# Patient Record
Sex: Female | Born: 1940 | Race: White | Hispanic: No | Smoking: Former smoker
Health system: Southern US, Community
[De-identification: ages and names within clinical notes are randomized; demographics above are authoritative.]

## PROBLEM LIST (undated history)

## (undated) DIAGNOSIS — G4730 Sleep apnea, unspecified: Secondary | ICD-10-CM

## (undated) DIAGNOSIS — A0472 Enterocolitis due to Clostridium difficile, not specified as recurrent: Secondary | ICD-10-CM

## (undated) DIAGNOSIS — E7800 Pure hypercholesterolemia, unspecified: Secondary | ICD-10-CM

## (undated) DIAGNOSIS — K5792 Diverticulitis of intestine, part unspecified, without perforation or abscess without bleeding: Secondary | ICD-10-CM

## (undated) DIAGNOSIS — C649 Malignant neoplasm of unspecified kidney, except renal pelvis: Secondary | ICD-10-CM

## (undated) DIAGNOSIS — I252 Old myocardial infarction: Secondary | ICD-10-CM

## (undated) DIAGNOSIS — D509 Iron deficiency anemia, unspecified: Secondary | ICD-10-CM

## (undated) DIAGNOSIS — Z9981 Dependence on supplemental oxygen: Secondary | ICD-10-CM

## (undated) DIAGNOSIS — E119 Type 2 diabetes mellitus without complications: Secondary | ICD-10-CM

## (undated) DIAGNOSIS — I272 Pulmonary hypertension, unspecified: Secondary | ICD-10-CM

## (undated) DIAGNOSIS — I509 Heart failure, unspecified: Secondary | ICD-10-CM

## (undated) DIAGNOSIS — N186 End stage renal disease: Secondary | ICD-10-CM

## (undated) DIAGNOSIS — I6529 Occlusion and stenosis of unspecified carotid artery: Secondary | ICD-10-CM

## (undated) DIAGNOSIS — R569 Unspecified convulsions: Secondary | ICD-10-CM

## (undated) DIAGNOSIS — J449 Chronic obstructive pulmonary disease, unspecified: Secondary | ICD-10-CM

## (undated) DIAGNOSIS — I2510 Atherosclerotic heart disease of native coronary artery without angina pectoris: Secondary | ICD-10-CM

## (undated) DIAGNOSIS — Z992 Dependence on renal dialysis: Secondary | ICD-10-CM

## (undated) DIAGNOSIS — I10 Essential (primary) hypertension: Secondary | ICD-10-CM

## (undated) DIAGNOSIS — I4891 Unspecified atrial fibrillation: Secondary | ICD-10-CM

## (undated) DIAGNOSIS — N2581 Secondary hyperparathyroidism of renal origin: Secondary | ICD-10-CM

## (undated) DIAGNOSIS — G40909 Epilepsy, unspecified, not intractable, without status epilepticus: Secondary | ICD-10-CM

## (undated) DIAGNOSIS — I219 Acute myocardial infarction, unspecified: Secondary | ICD-10-CM

## (undated) DIAGNOSIS — Z9289 Personal history of other medical treatment: Secondary | ICD-10-CM

## (undated) DIAGNOSIS — N289 Disorder of kidney and ureter, unspecified: Secondary | ICD-10-CM

## (undated) DIAGNOSIS — K219 Gastro-esophageal reflux disease without esophagitis: Secondary | ICD-10-CM

## (undated) DIAGNOSIS — Z8701 Personal history of pneumonia (recurrent): Secondary | ICD-10-CM

## (undated) HISTORY — DX: Heart failure, unspecified: I50.9

## (undated) HISTORY — DX: Acute myocardial infarction, unspecified: I21.9

## (undated) HISTORY — DX: Essential (primary) hypertension: I10

## (undated) HISTORY — DX: Malignant neoplasm of unspecified kidney, except renal pelvis: C64.9

## (undated) HISTORY — PX: ABDOMINAL HYSTERECTOMY: SHX81

## (undated) HISTORY — DX: Occlusion and stenosis of unspecified carotid artery: I65.29

## (undated) HISTORY — DX: Epilepsy, unspecified, not intractable, without status epilepticus: G40.909

## (undated) HISTORY — DX: Enterocolitis due to Clostridium difficile, not specified as recurrent: A04.72

## (undated) HISTORY — DX: Atherosclerotic heart disease of native coronary artery without angina pectoris: I25.10

## (undated) HISTORY — DX: Pulmonary hypertension, unspecified: I27.20

## (undated) HISTORY — PX: APPENDECTOMY: SHX54

## (undated) HISTORY — DX: Unspecified atrial fibrillation: I48.91

---

## 1989-05-30 HISTORY — PX: CORONARY ANGIOPLASTY WITH STENT PLACEMENT: SHX49

## 1989-05-30 HISTORY — PX: CORONARY ANGIOPLASTY: SHX604

## 2003-09-30 DIAGNOSIS — C649 Malignant neoplasm of unspecified kidney, except renal pelvis: Secondary | ICD-10-CM

## 2003-09-30 HISTORY — PX: NEPHRECTOMY: SHX65

## 2003-09-30 HISTORY — DX: Malignant neoplasm of unspecified kidney, except renal pelvis: C64.9

## 2009-10-01 ENCOUNTER — Ambulatory Visit: Payer: Self-pay | Admitting: Vascular Surgery

## 2009-10-05 ENCOUNTER — Inpatient Hospital Stay (HOSPITAL_COMMUNITY): Admission: RE | Admit: 2009-10-05 | Discharge: 2009-10-06 | Payer: Self-pay | Admitting: Vascular Surgery

## 2009-10-05 ENCOUNTER — Encounter: Payer: Self-pay | Admitting: Vascular Surgery

## 2009-10-05 ENCOUNTER — Ambulatory Visit: Payer: Self-pay | Admitting: Vascular Surgery

## 2009-10-05 HISTORY — PX: AV FISTULA PLACEMENT: SHX1204

## 2009-10-16 ENCOUNTER — Ambulatory Visit: Payer: Self-pay | Admitting: Vascular Surgery

## 2009-11-27 DIAGNOSIS — G40909 Epilepsy, unspecified, not intractable, without status epilepticus: Secondary | ICD-10-CM

## 2009-11-27 HISTORY — DX: Epilepsy, unspecified, not intractable, without status epilepticus: G40.909

## 2009-12-11 ENCOUNTER — Ambulatory Visit: Payer: Self-pay | Admitting: Vascular Surgery

## 2009-12-20 ENCOUNTER — Inpatient Hospital Stay (HOSPITAL_COMMUNITY): Admission: RE | Admit: 2009-12-20 | Discharge: 2009-12-21 | Payer: Self-pay | Admitting: Vascular Surgery

## 2009-12-20 ENCOUNTER — Encounter: Payer: Self-pay | Admitting: Vascular Surgery

## 2009-12-20 ENCOUNTER — Ambulatory Visit: Payer: Self-pay | Admitting: Vascular Surgery

## 2010-01-22 ENCOUNTER — Ambulatory Visit: Payer: Self-pay | Admitting: Vascular Surgery

## 2010-09-03 ENCOUNTER — Ambulatory Visit: Payer: Self-pay | Admitting: Vascular Surgery

## 2010-09-29 DIAGNOSIS — R569 Unspecified convulsions: Secondary | ICD-10-CM

## 2010-09-29 HISTORY — PX: CAROTID ENDARTERECTOMY: SUR193

## 2010-09-29 HISTORY — DX: Unspecified convulsions: R56.9

## 2010-10-20 ENCOUNTER — Encounter: Payer: Self-pay | Admitting: Vascular Surgery

## 2010-12-15 LAB — COMPREHENSIVE METABOLIC PANEL
Alkaline Phosphatase: 80 U/L (ref 39–117)
BUN: 37 mg/dL — ABNORMAL HIGH (ref 6–23)
CO2: 32 mEq/L (ref 19–32)
Chloride: 98 mEq/L (ref 96–112)
Creatinine, Ser: 2.15 mg/dL — ABNORMAL HIGH (ref 0.4–1.2)
GFR calc non Af Amer: 23 mL/min — ABNORMAL LOW (ref >60)
Potassium: 4.6 mEq/L (ref 3.5–5.1)
Total Protein: 6.6 g/dL (ref 6.0–8.3)

## 2010-12-15 LAB — CBC
HCT: 34.9 % — ABNORMAL LOW (ref 36.0–46.0)
MCHC: 33.9 g/dL (ref 30.0–36.0)
MCV: 93.2 fL (ref 78.0–100.0)
RBC: 3.75 MIL/uL — ABNORMAL LOW (ref 3.87–5.11)
RDW: 15.9 % — ABNORMAL HIGH (ref 11.5–15.5)

## 2010-12-15 LAB — BASIC METABOLIC PANEL
BUN: 33 mg/dL — ABNORMAL HIGH (ref 6–23)
CO2: 30 mEq/L (ref 19–32)
Calcium: 8.2 mg/dL — ABNORMAL LOW (ref 8.4–10.5)
Chloride: 102 mEq/L (ref 96–112)
GFR calc Af Amer: 29 mL/min — ABNORMAL LOW (ref >60)
Sodium: 140 mEq/L (ref 135–145)

## 2010-12-15 LAB — URINALYSIS, ROUTINE W REFLEX MICROSCOPIC
Bilirubin Urine: NEGATIVE
Ketones, ur: NEGATIVE mg/dL
Protein, ur: 100 mg/dL — AB
Urobilinogen, UA: 0.2 mg/dL (ref 0.0–1.0)

## 2010-12-15 LAB — APTT: aPTT: 33 seconds (ref 24–37)

## 2010-12-15 LAB — GLUCOSE, CAPILLARY
Glucose-Capillary: 113 mg/dL — ABNORMAL HIGH (ref 70–99)
Glucose-Capillary: 182 mg/dL — ABNORMAL HIGH (ref 70–99)
Glucose-Capillary: 88 mg/dL (ref 70–99)

## 2010-12-15 LAB — CROSSMATCH: Antibody Screen: NEGATIVE

## 2010-12-15 LAB — URINE MICROSCOPIC-ADD ON

## 2010-12-23 LAB — GLUCOSE, CAPILLARY
Glucose-Capillary: 117 mg/dL — ABNORMAL HIGH (ref 70–99)
Glucose-Capillary: 130 mg/dL — ABNORMAL HIGH (ref 70–99)
Glucose-Capillary: 146 mg/dL — ABNORMAL HIGH (ref 70–99)

## 2010-12-23 LAB — CBC
HCT: 18.5 % — ABNORMAL LOW (ref 36.0–46.0)
HCT: 29.2 % — ABNORMAL LOW (ref 36.0–46.0)
Hemoglobin: 6.1 g/dL — CL (ref 12.0–15.0)
Hemoglobin: 9.9 g/dL — ABNORMAL LOW (ref 12.0–15.0)
RBC: 1.92 MIL/uL — ABNORMAL LOW (ref 3.87–5.11)
RBC: 3.05 MIL/uL — ABNORMAL LOW (ref 3.87–5.11)
WBC: 10.7 10*3/uL — ABNORMAL HIGH (ref 4.0–10.5)

## 2010-12-23 LAB — CROSSMATCH

## 2010-12-23 LAB — POCT I-STAT 4, (NA,K, GLUC, HGB,HCT)
Hemoglobin: 9.2 g/dL — ABNORMAL LOW (ref 12.0–15.0)
Potassium: 4 mEq/L (ref 3.5–5.1)
Sodium: 142 mEq/L (ref 135–145)

## 2010-12-23 LAB — COMPREHENSIVE METABOLIC PANEL
ALT: 22 U/L (ref 0–35)
AST: 19 U/L (ref 0–37)
Albumin: 3.2 g/dL — ABNORMAL LOW (ref 3.5–5.2)
Alkaline Phosphatase: 94 U/L (ref 39–117)
Calcium: 9.5 mg/dL (ref 8.4–10.5)
GFR calc Af Amer: 26 mL/min — ABNORMAL LOW (ref >60)
Glucose, Bld: 107 mg/dL — ABNORMAL HIGH (ref 70–99)
Potassium: 4.2 mEq/L (ref 3.5–5.1)
Sodium: 143 mEq/L (ref 135–145)
Total Bilirubin: 0.4 mg/dL (ref 0.3–1.2)
Total Protein: 6.1 g/dL (ref 6.0–8.3)

## 2010-12-23 LAB — URINALYSIS, ROUTINE W REFLEX MICROSCOPIC
Bilirubin Urine: NEGATIVE
Ketones, ur: NEGATIVE mg/dL

## 2010-12-23 LAB — BASIC METABOLIC PANEL
Calcium: 8.2 mg/dL — ABNORMAL LOW (ref 8.4–10.5)
GFR calc Af Amer: 25 mL/min — ABNORMAL LOW (ref >60)
GFR calc non Af Amer: 21 mL/min — ABNORMAL LOW (ref >60)
Potassium: 4.3 mEq/L (ref 3.5–5.1)
Sodium: 140 mEq/L (ref 135–145)

## 2010-12-23 LAB — PROTIME-INR: Prothrombin Time: 13 seconds (ref 11.6–15.2)

## 2010-12-23 LAB — URINE MICROSCOPIC-ADD ON

## 2011-02-11 NOTE — Assessment & Plan Note (Signed)
OFFICE VISIT   Cook, Suzanne A  DOB:  [birth year]                                       09/03/2010  [id number]#:[id number]   Patient returns for continued follow-up today her staged bilateral  carotid endarterectomies done earlier this year.  She had the right side  done in January, the left side done in March.  She did have a severe  reperfusion syndrome and was admitted to [facility] following her  discharge from the left side.  Had seizure disorders temporarily, which  resolved, and she has been totally neurologically intact since that  time.  She denies any hemiparesis, aphasia, amaurosis fugax, diplopia,  blurred vision, syncope, or other neurologic symptoms since her  discharge.  She is taking 1 aspirin per day.   CHRONIC MEDICAL PROBLEMS:  1. Diabetes.  2. Hypertension.  3. Coronary artery disease.  4. History of congestive heart failure.  5. COPD, occasionally on home oxygen.   SOCIAL HISTORY:  She is widowed and has 5 children.  Does not use  tobacco or alcohol.  She quit smoking in 2005.   REVIEW OF SYSTEMS:  Positive for occasional claudication-type symptoms  in her legs, occasional home oxygen, chronic bronchitis.  No chest pain  or orthopnea.   PHYSICAL EXAMINATION:  Blood pressure 143/78, heart rate 76,  respirations 16.  General:  She is a well-developed, well-nourished  female in no apparent distress.  HEENT:  Normal for age.  EOMs intact.  Lungs:  Clear to auscultation.  Bilateral rhonchi noted.  Cardiovascular:  Regular rhythm with no murmurs.  Carotid pulses 3+.  No  bruits.  Abdomen:  Soft, nontender with no masses.  Musculoskeletal:  Free of major deformities.  Neurologic:  Normal.  Skin is free of  rashes.   Today I ordered a carotid duplex exam, which I have reviewed and  interpreted.  Both internal carotids are widely patent with no evidence  of restenosis.   I am very pleased with her result and will continue to follow her  on a  regular basis.  She will return in 6 months with a follow-up carotid  duplex at that time unless she develops any symptoms in the interim.     [name], M.D.  Electronically Signed   JDL/MEDQ  D:  09/03/2010  T:  09/04/2010  Job:  [id number]

## 2011-02-11 NOTE — Assessment & Plan Note (Signed)
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  She also has an apparent severe left internal carotid stenosis which  appears patent on one duplex scan and questionable on another.  She did  well following her surgery with no hemiparesis, aphasia, amaurosis  fugax, diplopia, blurred vision or syncope.  However, 3-4 days post  discharge she was readmitted for congestive heart failure and had a  glucose of 500.  She was hospitalized for 5 days and is now on home  oxygen which she has been on intermittently in the past.  Currently she  has no dyspnea and is doing well now 3 days post discharge.   PHYSICAL EXAM:  Today blood pressure 120/78, heart rate 78, respirations  14, O2 sats are 93%.  Right neck incision has healed nicely.  Neurological:  Normal.  Carotid pulses are 3+, very soft bruit on the  left.  No bruit on the right.  Chest:  Clear to auscultation.   I am pleased with her result from the carotid surgery but obviously she  does have a tenuous status.  I discussed her situation preop with Dr.  Dulce Sellar who will be seeing her again in the near future.  I will have her  return to see me in 8 weeks with a CT angiogram of her carotids to be  certain that her left internal carotid is patent prior to scheduling  surgery on the left side.  If she has any neurologic symptoms she will  be in touch with [place] in the interim.  She is back on her daily Plavix.     [name], M.D.  Electronically Signed   JDL/MEDQ  D:  10/16/2009  T:  10/17/2009  Job:  [id number]

## 2011-02-11 NOTE — Procedures (Signed)
CAROTID DUPLEX EXAM   INDICATION:  Followup known carotid artery disease.   HISTORY:  Diabetes:  No.  Cardiac:  No.  Hypertension:  Yes.  Smoking:  Quit.  Previous Surgery:  Right carotid endarterectomy on 10/05/2009.  CV History:  No.  Amaurosis Fugax No, Paresthesias No, Hemiparesis No                                       RIGHT             LEFT  Brachial systolic pressure:  Brachial Doppler waveforms:  Vertebral direction of flow:                          Antegrade  DUPLEX VELOCITIES (cm/sec)  CCA peak systolic                                     37  ECA peak systolic                                     512  ICA peak systolic                                     276  ICA end diastolic                                     148  PLAQUE MORPHOLOGY:                                    Calcified  PLAQUE AMOUNT:                                        Severe  PLAQUE LOCATION:                                      ICA, CCA and ECA   IMPRESSION:  1. Limited study.  2. 80%-99% stenosis noted at the proximal left internal carotid      artery.  3. Antegrade left vertebral arteries.   ___________________________________________  [name], M.D.   MG/MEDQ  D:  12/11/2009  T:  12/12/2009  Job:  [id number]

## 2011-02-11 NOTE — Procedures (Signed)
CAROTID DUPLEX EXAM   INDICATION:  Follow up carotid artery disease.   HISTORY:  Diabetes:  No.  Cardiac:  No.  Hypertension:  Yes.  Smoking:  Quit.  Previous Surgery:  Right carotid endarterectomy 10/05/2009.  Left  carotid endarterectomy 12/20/2009.  CV History:  Patient is currently asymptomatic.  Amaurosis Fugax No, Paresthesias No, Hemiparesis No.                                       RIGHT             LEFT  Brachial systolic pressure:         168               169  Brachial Doppler waveforms:         WNL               WNL  Vertebral direction of flow:        Antegrade         Antegrade  DUPLEX VELOCITIES (cm/sec)  CCA peak systolic                   95                115  ECA peak systolic                   107               149  ICA peak systolic                   96                116  ICA end diastolic                   26                39  PLAQUE MORPHOLOGY:                  Heterogenous  PLAQUE AMOUNT:                      Mild  PLAQUE LOCATION:                    CCA               ECA   IMPRESSION:  1. Bilateral patent carotid endarterectomies.  2. Increased velocities within the left internal carotid artery,      suggestive of a 40% to 59% stenosis.  This is secondary to vessel      tortuosity.   ___________________________________________  [name], M.D.   OD/MEDQ  D:  09/03/2010  T:  09/03/2010  Job:  [id number]

## 2011-02-11 NOTE — H&P (Signed)
HISTORY AND PHYSICAL EXAMINATION   October 01, 2009   Re:  [name], [place]              DOB:  [birth year]   CHIEF COMPLAINT:  Severe bilateral carotid occlusive disease,  asymptomatic.   HISTORY OF PRESENT ILLNESS:  This 70 year old female patient was  referred by Dr. Norman Herrlich for evaluation of carotid occlusive  disease.  This patient states that she had a carotid ultrasound  performed about 5 or 6 years ago, has not had a study since then until a  few weeks ago when a repeat study revealed 95 plus percent stenosis in  both internal carotid arteries.  She denies any history of stroke,  hemiparesis, aphasia, amaurosis fugax, diplopia, blurred vision, or  syncope.   CHRONIC STABLE PROBLEMS:  1. Coronary artery disease, status post myocardial infarction in 1991.  2. Diabetes mellitus type 2.  3. Hypertension.  4. Hyperlipidemia.  5. Sleep apnea.  6. History of right renal cancer, status post nephrectomy 6 years ago.  7. History of congestive heart failure.  The patient has had PTCA and      stenting of her coronary arteries several years ago and has been      relatively free of symptoms.  8. Other chronic problems include COPD.   FAMILY HISTORY:  Positive for diabetes in her mother, coronary artery  disease in her father.  Negative for stroke.   SOCIAL HISTORY:  She is widowed and has 5 children.  She smoked a pack  of cigarettes per day for 45+ years.  Quit this in 2005.  Does not use  alcohol.   REVIEW OF SYSTEMS:  Negative for weight loss, anorexia, chest pain.  Does have home oxygen at night.  Dyspnea on exertion, occasional  wheezing.  No GI symptoms.  Has urinary frequency.  Lower leg discomfort  with ambulation.  Depression.  All other systems in review of systems  are negative.   PHYSICAL EXAMINATION:  Blood pressure 186/93, heart rates 91,  respirations 16.  General:  She is a well-developed, well-nourished  female in no apparent distress.  She is  alert and oriented x3.  Neck:  Supple, 3+ carotid pulses are palpable.  HEENT:  Unremarkable.  EOMs  intact.  No cervical lymphadenopathy is present.  Chest:  Clear to  auscultation.  Cardiovascular:  Regular rhythm.  No murmurs.  Abdomen:  Soft, nontender with no masses.  Musculoskeletal reveals no major  deformities.  Neurologic exam is normal.  Skin is free of rashes.  Lower  extremity exam reveals 3+ femora and 2+ dorsalis pedis pulses  bilaterally.   I reviewed the clinical notes from [facility] from Dr. Dulce Sellar  on Monday and reviewed the carotid duplex report from [facility].  I ordered a carotid duplex exam today in our  office for confirmation, which I reviewed and interpreted, and it  reveals a 95 plus percent right internal carotid stenosis.  There is a  severe plaque at the left carotid bifurcation, which has resulted in  either a severe stenosis or occlusion of the left internal carotid.   IMPRESSION:  Severe bilateral carotid occlusive disease, asymptomatic.   PLAN:  Admit the patient on January 7 for elective right carotid  endarterectomy.  Risk and benefits have been thoroughly discussed.  The  left carotid exploration will be performed following this a few weeks  later if the patient has no difficulties.  She will hold her Plavix for  48 hours preoperatively.     [name], M.D.  Electronically Signed   JDL/MEDQ  D:  10/01/2009  T:  10/02/2009  Job:  [id number]   cc:   [name], MD  Dr. Norman Herrlich

## 2011-02-11 NOTE — Assessment & Plan Note (Signed)
OFFICE VISIT   Cook, Suzanne A  DOB:  [birth year]                                       01/22/2010  EAVWU#:[id number]   The patient returns for evaluation following left carotid endarterectomy  performed by me on March 24 for an almost totally occluded left internal  carotid artery having previously undergone a right carotid  endarterectomy 10/05/2009 with severe stenosis on that side.  She did  well in the perioperative period and was discharged the following day  with no neurologic complications, doing well from a pulmonary  standpoint.  She is on home oxygen intermittently.  Apparently another  day or two later she was admitted to [facility] with seizure  activity and some weakness on the right side and although I have not  received any medical records from [facility] it sounds as though she may  have had a reperfusion bleed in the left hemisphere.  She was  hospitalized for 2-3 weeks and her neurologic deficits have all  resolved.  Today she is alert, oriented, follows commands and does not  have any neurologic deficits that I can determine.  I do not have any  medical records from [facility].   PHYSICAL EXAM:  Blood pressure 119/70, heart rate 83, temperature 98.  She is alert and oriented x3.  Her neck is supple.  Both carotid pulses  are 3+ with no bruits audible.  Neurologic exam is unremarkable.  She is  on nasal oxygen.  She takes aspirin and Plavix daily.   She seems to be getting along well at this point and I have asked them  to forward her hospital records from [facility] to me to review.  I will  see her in 6 months with a followup carotid duplex exam at that time.     [name], M.D.  Electronically Signed   JDL/MEDQ  D:  01/22/2010  T:  01/23/2010  Job:  [id number]   cc:   Dr Tylene Fantasia, MD

## 2011-02-11 NOTE — Procedures (Signed)
CAROTID DUPLEX EXAM   INDICATION:  Carotid disease.   HISTORY:  Diabetes:  No.  Cardiac:  No.  Hypertension:  Yes.  Smoking:  Previous.  Previous Surgery:  No.  CV History:  Currently asymptomatic.  Amaurosis Fugax No, Paresthesias No, Hemiparesis No                                       RIGHT             LEFT  Brachial systolic pressure:         182               180  Brachial Doppler waveforms:         Normal            Normal  Vertebral direction of flow:        Antegrade         Antegrade  DUPLEX VELOCITIES (cm/sec)  CCA peak systolic                   110               26  ECA peak systolic                   313               444/145 (ECA  versus bifurcation area)  ICA peak systolic                   516               Not visualized  ICA end diastolic                   167  PLAQUE MORPHOLOGY:                  Mixed             Mixed  PLAQUE AMOUNT:                      Severe            Severe  PLAQUE LOCATION:                    ICA / ECA / CCA   ICA / ECA / CCA   IMPRESSION:  1. 80%-99% stenosis of the right internal carotid artery.  2. There is an elevated velocity of greater than 400 cm/s noted at the      left bifurcation region.  3. The left internal carotid artery was not adequately visualized due      to calcific vessels making it unable to determine if there is a      total occlusion present.  4. Dampened Doppler waveforms are noted throughout the left common      carotid artery.   ___________________________________________  [name], M.D.   CH/MEDQ  D:  10/02/2009  T:  10/03/2009  Job:  [id number]

## 2011-02-11 NOTE — H&P (Signed)
HISTORY AND PHYSICAL EXAMINATION   December 11, 2009   Re:  Cook, Suzanne A                  DOB:  Aug 17, 1941   CHIEF COMPLAINT:  Severe left internal carotid stenosis with left  hemispheric TIAs.   HISTORY OF PRESENT ILLNESS:  This 70 year old female patient was  referred by Dr. Norman Herrlich for severe bilateral carotid occlusive  disease in January of 2011.  She was found to have 95 plus percent right  internal carotid stenosis and a more severe left internal carotid  stenosis which was thought possibly to be totally occluded but suspected  to be tightly stenotic.  She was asymptomatic at that time and underwent  a right carotid endarterectomy on January 7 by me.  She has had two  hospitalizations since that time for COPD exacerbation and some  congestive failure.  She has been treated with diuretics and is now on  home oxygen.  Reports today with original plan to do a CT angiogram of  her left internal carotid artery but because of a creatinine of 2 and a  previous right nephrectomy we cancelled that and repeated her carotid  duplex exam.  It is clear today having reviewed the study that she has a  95% left internal carotid stenosis which appears to be patent.  She has  had a few episodes of some garbled speech and weakness in the right  upper and lower extremities over the last few weeks.  We are now  scheduling her left carotid endarterectomy for prevention of stroke.   PAST MEDICAL HISTORY:  1. Coronary artery disease.  2. Diabetes type 2.  3. Hypertension.  4. Hyperlipidemia.  5. Sleep apnea.  6. History of right renal cancer status post right nephrectomy 6 years      ago.  7. History of PTCA and stenting in the past with congestive heart      failure.  8. COPD.   FAMILY HISTORY:  Positive for coronary artery disease in her father.   SOCIAL HISTORY:  She is widowed, has five children and is retired.  Quit  smoking 5 years ago.  Does not use alcohol.   REVIEW OF SYSTEMS:  Positive for dyspnea on exertion with home oxygen,  chronic renal insufficiency, lower extremity discomfort with walking,  dizziness, occasional chest discomfort.   PHYSICAL EXAM:  Vital signs:  Blood pressure 126/77, heart rate 96,  respirations 14, O2 saturation 96%.  General:  She is a chronically ill-  appearing female in no apparent distress, alert and oriented x3.  HEENT:  EOMs intact.  Normal conjunctivae.  Lungs:  Are clear to auscultation.  No wheezing.  Cardiovascular:  Regular rhythm.  No murmurs.  Carotid  pulses are 3+.  Soft bruit on the left.  Abdomen:  Soft, nontender with  no masses.  Musculoskeletal:  Free of major deformities.  Neurologic:  Normal.   IMPRESSION:  Severe left internal carotid stenosis with recent left  brain transient ischemic attack.   PLAN:  To admit the patient on March 24 for an elective left carotid  endarterectomy.  Risks and benefits have been thoroughly discussed.  The  family realizes that she does have significant COPD and cardiac risk and  also neurologic risks associated with this.  If she has any further  symptoms between now and the scheduled surgery she will be in touch with  [place].     [name], M.D.  Electronically Signed   JDL/MEDQ  D:  12/11/2009  T:  12/12/2009  Job:  [id number]

## 2011-03-20 ENCOUNTER — Other Ambulatory Visit: Payer: Self-pay

## 2011-05-16 ENCOUNTER — Other Ambulatory Visit: Payer: Self-pay

## 2011-08-18 ENCOUNTER — Ambulatory Visit (INDEPENDENT_AMBULATORY_CARE_PROVIDER_SITE_OTHER): Payer: Medicare Other | Admitting: *Deleted

## 2011-08-18 DIAGNOSIS — I6529 Occlusion and stenosis of unspecified carotid artery: Secondary | ICD-10-CM

## 2011-08-18 DIAGNOSIS — Z48812 Encounter for surgical aftercare following surgery on the circulatory system: Secondary | ICD-10-CM

## 2011-08-28 ENCOUNTER — Encounter: Payer: Self-pay | Admitting: Vascular Surgery

## 2011-08-28 NOTE — Procedures (Unsigned)
CAROTID DUPLEX EXAM  INDICATION:  Bilateral carotid endarterectomies.  HISTORY: Diabetes:  No. Cardiac:  No. Hypertension:  Yes. Smoking:  Previous. Previous Surgery:  Right carotid endarterectomy on 10/05/2009, left carotid endarterectomy on 12/20/2009. CV History:  Currently asymptomatic. Amaurosis Fugax No, Paresthesias No, Hemiparesis No.                                      RIGHT             LEFT Brachial systolic pressure:         162               158 Brachial Doppler waveforms:         Normal            Normal Vertebral direction of flow:        Antegrade         Antegrade DUPLEX VELOCITIES (cm/sec) CCA peak systolic                   109               140 ECA peak systolic                   76                158 ICA peak systolic                   103               111 ICA end diastolic                   17                27 PLAQUE MORPHOLOGY:                  Heterogenous      Heterogenous PLAQUE AMOUNT:                      Mild              Mild PLAQUE LOCATION:                    CCA               CCA  IMPRESSION: 1. Patent bilateral carotid endarterectomy sites with no bilateral     internal carotid artery stenoses. 2. No significant change noted when compared to the previous     examination on 09/03/2010.  ___________________________________________ Quita Skye. Hart [place], M.D.  CH/MEDQ  D:  08/20/2011  T:  08/20/2011  Job:  [id number]

## 2011-09-01 ENCOUNTER — Other Ambulatory Visit: Payer: Self-pay

## 2011-09-01 DIAGNOSIS — I6529 Occlusion and stenosis of unspecified carotid artery: Secondary | ICD-10-CM

## 2011-09-01 DIAGNOSIS — Z48812 Encounter for surgical aftercare following surgery on the circulatory system: Secondary | ICD-10-CM

## 2011-09-30 DIAGNOSIS — Z9289 Personal history of other medical treatment: Secondary | ICD-10-CM

## 2011-09-30 HISTORY — DX: Personal history of other medical treatment: Z92.89

## 2011-09-30 HISTORY — PX: AV FISTULA REPAIR: SHX563

## 2011-12-29 DIAGNOSIS — I4891 Unspecified atrial fibrillation: Secondary | ICD-10-CM

## 2011-12-29 HISTORY — DX: Unspecified atrial fibrillation: I48.91

## 2012-05-20 HISTORY — PX: OTHER SURGICAL HISTORY: SHX169

## 2012-06-02 ENCOUNTER — Other Ambulatory Visit: Payer: Self-pay

## 2012-06-02 DIAGNOSIS — T82598A Other mechanical complication of other cardiac and vascular devices and implants, initial encounter: Secondary | ICD-10-CM

## 2012-06-11 ENCOUNTER — Encounter: Payer: Self-pay | Admitting: Surgery

## 2012-06-18 ENCOUNTER — Encounter: Payer: Self-pay | Admitting: Surgery

## 2012-06-21 ENCOUNTER — Ambulatory Visit (INDEPENDENT_AMBULATORY_CARE_PROVIDER_SITE_OTHER): Payer: Medicare Other | Admitting: Surgery

## 2012-06-21 ENCOUNTER — Encounter: Payer: Self-pay | Admitting: Surgery

## 2012-06-21 ENCOUNTER — Encounter (INDEPENDENT_AMBULATORY_CARE_PROVIDER_SITE_OTHER): Payer: Medicare Other | Admitting: *Deleted

## 2012-06-21 VITALS — BP 124/61 | HR 93 | Resp 16 | Ht 64.0 in | Wt 178.9 lb

## 2012-06-21 DIAGNOSIS — N186 End stage renal disease: Secondary | ICD-10-CM | POA: Insufficient documentation

## 2012-06-21 DIAGNOSIS — T82898A Other specified complication of vascular prosthetic devices, implants and grafts, initial encounter: Secondary | ICD-10-CM | POA: Insufficient documentation

## 2012-06-21 DIAGNOSIS — T82598A Other mechanical complication of other cardiac and vascular devices and implants, initial encounter: Secondary | ICD-10-CM

## 2012-06-21 NOTE — Progress Notes (Signed)
[facility] [place]   Patient name: [name] MRN: [id number] DOB: 03/12/1941 Sex: female     Chief Complaint  Patient presents with  . Re-evaluation    poorly maturing rt AVF(placed at [facility]) Dr. Richard Fox HD TTS    HISTORY OF PRESENT ILLNESS: The patient is seen today do to a poorly maturing right upper arm fistula. This was placed in 2011 at [facility]. She recently started dialysis. They were unable to use her fistula and placed a left-sided catheter. She is in today for further access management.  Past Medical History  Diagnosis Date  . Carotid artery occlusion   . ESRD (end stage renal disease)   . C. difficile diarrhea   . Cancer     Renal Cell Carcinoma  . Hypertension   . Diabetes mellitus     Type II  . ASCVD (arteriosclerotic cardiovascular disease)   . Myocardial infarction   . PCI (pneumatosis cystoides intestinalis)   . Atrial fibrillation April  2013  . CHF (congestive heart failure)   . Pulmonary hypertension   . CKD (chronic kidney disease)   . Anemia   . Increased PTH level     Past Surgical History  Procedure Date  . Nephrectomy     Right, for Renal cell carcinoma  . Carotid endarterectomy   . Av fistula placement Jan. 7, 2011    Right  upper arm by Dr. Lawson  . Hd catheter Aug. 22, 2013    [facility]    History   Social History  . Marital Status: Widowed    Spouse Name: N/A    Number of Children: N/A  . Years of Education: N/A   Occupational History  . Not on file.   Social History Main Topics  . Smoking status: Former Smoker -- 50 years    Types: Cigarettes    Quit date: 09/30/2003  . Smokeless tobacco: Not on file  . Alcohol Use: Yes  . Drug Use: No  . Sexually Active: Not on file   Other Topics Concern  . Not on file   Social History Narrative  . No narrative on file    Family History  Problem Relation Age of Onset  . Cancer Mother     pancreatic  . Stroke Father   .  Coronary artery disease Father   . Cancer Brother     Brain  . Kidney disease Brother     Kidney stones    Allergies as of 06/21/2012  . (No Known Allergies)    Current Outpatient Prescriptions on File Prior to Visit  Medication Sig Dispense Refill  . albuterol (PROVENTIL) (2.5 MG/3ML) 0.083% nebulizer solution Take 2.5 mg by nebulization every 6 (six) hours as needed.      . calcium carbonate 200 MG capsule Take 250 mg by mouth 2 (two) times daily with a meal.      . carvedilol (COREG) 25 MG tablet Take 25 mg by mouth 2 (two) times daily with a meal.       . Ezetimibe (ZETIA PO) Take 10 mg by mouth daily.      . Flora-Q (FLORA-Q) CAPS Take by mouth.      . fluticasone (FLOVENT HFA) 110 MCG/ACT inhaler Inhale 1 puff into the lungs 2 (two) times daily.      . furosemide (LASIX) 80 MG tablet Take 80 mg by mouth 2 (two) times daily.      . metolazone (ZAROXOLYN) 2.5   MG tablet Take 2.5 mg by mouth daily.      . rOPINIRole (REQUIP) 1 MG tablet Take 1 mg by mouth 3 (three) times daily.      . amiodarone (PACERONE) 200 MG tablet Take 200 mg by mouth daily.      . cloNIDine (CATAPRES) 0.1 MG tablet Take 0.1 mg by mouth 2 (two) times daily.      . diltiazem (CARDIZEM) 120 MG tablet Take 120 mg by mouth 4 (four) times daily.      . insulin aspart (NOVOLOG) 100 UNIT/ML injection Inject into the skin every other day.      . loratadine (CLARITIN) 10 MG tablet Take 10 mg by mouth as needed.      . metoprolol succinate (TOPROL-XL) 25 MG 24 hr tablet Take 25 mg by mouth daily.      . omeprazole (PRILOSEC) 40 MG capsule Take 40 mg by mouth daily.      . vancomycin (VANCOCIN) 125 MG capsule Take 125 mg by mouth 4 (four) times daily.         REVIEW OF SYSTEMS: No chest pain. Home O2 for COPD   PHYSICAL EXAMINATION:   Vital signs are BP 124/61  Pulse 93  Resp 16  Ht 5' 4" (1.626 m)  Wt 178 lb 14.4 oz (81.149 kg)  BMI 30.71 kg/m2  SpO2 98% General: The patient appears their stated  age. HEENT:  No gross abnormalities Pulmonary:  Non labored breathing Musculoskeletal: There are no major deformities. Skin: There are no ulcer or rashes noted. Psychiatric: The patient has normal affect. Cardiovascular: There is a thrill within the antecubital crease however this is diminished as you go up the arm   Diagnostic Studies Ultrasound of her fistula was performed today. This shows the fistula diameter to be 0.8-0.9. The depth ranges from 0.8-1.57 cm. There are elevated velocities within the cephalic vein as it goes transversely across the antecubital crease. There is a large 0.6 cm branch in the lower upper arm.  Assessment: Poorly functioning right upper arm fistula  Plan: The patient will be scheduled for a fistulogram Wednesday, October 2. I will initially plan on accessing the fistula in the antecubital crease and doing a formal study. I potentially would be able to embolize the competing branches. I discussed that we could potentially treat her fistula percutaneously however if the difficulty to use this is due to the depth she may require surgical intervention.  V. Wells Brabham IV, M.D. [facility] [place] Office: [phone] Pager:  [phone]   

## 2012-06-23 ENCOUNTER — Other Ambulatory Visit: Payer: Self-pay

## 2012-06-23 ENCOUNTER — Encounter (HOSPITAL_COMMUNITY): Payer: Self-pay | Admitting: Pharmacy Technician

## 2012-07-07 ENCOUNTER — Encounter (HOSPITAL_COMMUNITY)
Admission: RE | Disposition: A | Discharge status: Home / Self Care | Payer: Self-pay | Source: Ambulatory Visit | Attending: Surgery

## 2012-07-07 ENCOUNTER — Ambulatory Visit (HOSPITAL_COMMUNITY)
Admission: RE | Admit: 2012-07-07 | Discharge: 2012-07-07 | Disposition: A | Discharge status: Home / Self Care | Payer: Medicare Other | Source: Ambulatory Visit | Attending: Surgery | Admitting: Surgery

## 2012-07-07 DIAGNOSIS — I871 Compression of vein: Secondary | ICD-10-CM | POA: Insufficient documentation

## 2012-07-07 DIAGNOSIS — T82898A Other specified complication of vascular prosthetic devices, implants and grafts, initial encounter: Secondary | ICD-10-CM | POA: Insufficient documentation

## 2012-07-07 DIAGNOSIS — T82598A Other mechanical complication of other cardiac and vascular devices and implants, initial encounter: Secondary | ICD-10-CM | POA: Insufficient documentation

## 2012-07-07 DIAGNOSIS — Y832 Surgical operation with anastomosis, bypass or graft as the cause of abnormal reaction of the patient, or of later complication, without mention of misadventure at the time of the procedure: Secondary | ICD-10-CM | POA: Insufficient documentation

## 2012-07-07 DIAGNOSIS — I120 Hypertensive chronic kidney disease with stage 5 chronic kidney disease or end stage renal disease: Secondary | ICD-10-CM | POA: Insufficient documentation

## 2012-07-07 DIAGNOSIS — E119 Type 2 diabetes mellitus without complications: Secondary | ICD-10-CM | POA: Insufficient documentation

## 2012-07-07 DIAGNOSIS — N186 End stage renal disease: Secondary | ICD-10-CM | POA: Insufficient documentation

## 2012-07-07 HISTORY — PX: EMBOLIZATION: SHX5507

## 2012-07-07 HISTORY — PX: SHUNTOGRAM: SHX5491

## 2012-07-07 LAB — POCT I-STAT, CHEM 8
BUN: 28 mg/dL — ABNORMAL HIGH (ref 6–23)
Chloride: 100 mEq/L (ref 96–112)
Creatinine, Ser: 3.1 mg/dL — ABNORMAL HIGH (ref 0.50–1.10)
Glucose, Bld: 97 mg/dL (ref 70–99)
Potassium: 3.6 mEq/L (ref 3.5–5.1)

## 2012-07-07 LAB — GLUCOSE, CAPILLARY: Glucose-Capillary: 89 mg/dL (ref 70–99)

## 2012-07-07 SURGERY — ASSESSMENT, SHUNT FUNCTION, WITH CONTRAST RADIOGRAPHIC STUDY
Anesthesia: LOCAL

## 2012-07-07 MED ORDER — HEPARIN SODIUM (PORCINE) 1000 UNIT/ML IJ SOLN
INTRAMUSCULAR | Status: AC
  Filled 2012-07-07: qty 1

## 2012-07-07 MED ORDER — SODIUM CHLORIDE 0.9 % IJ SOLN: 3.0000 mL | INTRAMUSCULAR | Status: DC | PRN

## 2012-07-07 MED ORDER — HEPARIN (PORCINE) IN NACL 2-0.9 UNIT/ML-% IJ SOLN
INTRAMUSCULAR | Status: AC
  Filled 2012-07-07: qty 500

## 2012-07-07 MED ORDER — LIDOCAINE HCL (PF) 1 % IJ SOLN
INTRAMUSCULAR | Status: AC
  Filled 2012-07-07: qty 30

## 2012-07-07 NOTE — Interval H&P Note (Signed)
History and Physical Interval Note:  07/07/2012 9:26 AM  [name]  has presented today for surgery, with the diagnosis of End stage renal  The various methods of treatment have been discussed with the patient and family. After consideration of risks, benefits and other options for treatment, the patient has consented to  Procedure(s) (LRB) with comments: SHUNTOGRAM (N/A) as a surgical intervention .  The patient's history has been reviewed, patient examined, no change in status, stable for surgery.  I have reviewed the patient's chart and labs.  Questions were answered to the patient's satisfaction.     BRABHAM IV, V. WELLS

## 2012-07-07 NOTE — H&P (View-Only) (Signed)
[facility]ist of [facility]   Patient name: [name] MRN: [id number] DOB: 1941-02-15 Sex: female     Chief Complaint  Patient presents with  . Re-evaluation    poorly maturing rt AVF(placed at [facility]) Dr. Marina Gravel HD TTS    HISTORY OF PRESENT ILLNESS: The patient is seen today do to a poorly maturing right upper arm fistula. This was placed in 2011 at [facility]. She recently started dialysis. They were unable to use her fistula and placed a left-sided catheter. She is in today for further access management.  Past Medical History  Diagnosis Date  . Carotid artery occlusion   . ESRD (end stage renal disease)   . C. difficile diarrhea   . Cancer     Renal Cell Carcinoma  . Hypertension   . Diabetes mellitus     Type II  . ASCVD (arteriosclerotic cardiovascular disease)   . Myocardial infarction   . PCI (pneumatosis cystoides intestinalis)   . Atrial fibrillation April  2013  . CHF (congestive heart failure)   . Pulmonary hypertension   . CKD (chronic kidney disease)   . Anemia   . Increased PTH level     Past Surgical History  Procedure Date  . Nephrectomy     Right, for Renal cell carcinoma  . Carotid endarterectomy   . Av fistula placement Jan. 7, 2011    Right  upper arm by Dr. Hart Rochester  . Hd catheter Aug. 22, 2013    [facility]    History   Social History  . Marital Status: Widowed    Spouse Name: N/A    Number of Children: N/A  . Years of Education: N/A   Occupational History  . Not on file.   Social History Main Topics  . Smoking status: Former Smoker -- 50 years    Types: Cigarettes    Quit date: 09/30/2003  . Smokeless tobacco: Not on file  . Alcohol Use: Yes  . Drug Use: No  . Sexually Active: Not on file   Other Topics Concern  . Not on file   Social History Narrative  . No narrative on file    Family History  Problem Relation Age of Onset  . Cancer Mother     pancreatic  . Stroke Father   .  Coronary artery disease Father   . Cancer Brother     Brain  . Kidney disease Brother     Kidney stones    Allergies as of 06/21/2012  . (No Known Allergies)    Current Outpatient Prescriptions on File Prior to Visit  Medication Sig Dispense Refill  . albuterol (PROVENTIL) (2.5 MG/3ML) 0.083% nebulizer solution Take 2.5 mg by nebulization every 6 (six) hours as needed.      . calcium carbonate 200 MG capsule Take 250 mg by mouth 2 (two) times daily with a meal.      . carvedilol (COREG) 25 MG tablet Take 25 mg by mouth 2 (two) times daily with a meal.       . Ezetimibe (ZETIA PO) Take 10 mg by mouth daily.      Donald Prose (FLORA-Q) CAPS Take by mouth.      . fluticasone (FLOVENT HFA) 110 MCG/ACT inhaler Inhale 1 puff into the lungs 2 (two) times daily.      . furosemide (LASIX) 80 MG tablet Take 80 mg by mouth 2 (two) times daily.      . metolazone (ZAROXOLYN) 2.5  MG tablet Take 2.5 mg by mouth daily.      Marland Kitchen rOPINIRole (REQUIP) 1 MG tablet Take 1 mg by mouth 3 (three) times daily.      Marland Kitchen amiodarone (PACERONE) 200 MG tablet Take 200 mg by mouth daily.      . cloNIDine (CATAPRES) 0.1 MG tablet Take 0.1 mg by mouth 2 (two) times daily.      Marland Kitchen diltiazem (CARDIZEM) 120 MG tablet Take 120 mg by mouth 4 (four) times daily.      . insulin aspart (NOVOLOG) 100 UNIT/ML injection Inject into the skin every other day.      . loratadine (CLARITIN) 10 MG tablet Take 10 mg by mouth as needed.      . metoprolol succinate (TOPROL-XL) 25 MG 24 hr tablet Take 25 mg by mouth daily.      Marland Kitchen omeprazole (PRILOSEC) 40 MG capsule Take 40 mg by mouth daily.      . vancomycin (VANCOCIN) 125 MG capsule Take 125 mg by mouth 4 (four) times daily.         REVIEW OF SYSTEMS: No chest pain. Home O2 for COPD   PHYSICAL EXAMINATION:   Vital signs are BP 124/61  Pulse 93  Resp 16  Ht 5\' 4"  (1.626 m)  Wt 178 lb 14.4 oz (81.149 kg)  BMI 30.71 kg/m2  SpO2 98% General: The patient appears their stated  age. HEENT:  No gross abnormalities Pulmonary:  Non labored breathing Musculoskeletal: There are no major deformities. Skin: There are no ulcer or rashes noted. Psychiatric: The patient has normal affect. Cardiovascular: There is a thrill within the antecubital crease however this is diminished as you go up the arm   Diagnostic Studies Ultrasound of her fistula was performed today. This shows the fistula diameter to be 0.8-0.9. The depth ranges from 0.8-1.57 cm. There are elevated velocities within the cephalic vein as it goes transversely across the antecubital crease. There is a large 0.6 cm branch in the lower upper arm.  Assessment: Poorly functioning right upper arm fistula  Plan: The patient will be scheduled for a fistulogram Wednesday, October 2. I will initially plan on accessing the fistula in the antecubital crease and doing a formal study. I potentially would be able to embolize the competing branches. I discussed that we could potentially treat her fistula percutaneously however if the difficulty to use this is due to the depth she may require surgical intervention.  Jorge Ny, M.D. [facility] of [place] Office: [phone] Pager:  [phone]

## 2012-07-07 NOTE — Op Note (Signed)
Vascular and Vein Specialists of [facility]  Patient name: [name] MRN: [id number] DOB: 26-Feb-1941 Sex: female  07/07/2012 Pre-operative Diagnosis: Poorly functioning right arm fistula Post-operative diagnosis:  Same Surgeon:  Jorge Ny Procedure Performed:  1.  ultrasound access right cephalic vein x2  2.  fistulogram  3.  coil embolization, cephalic vein branch  4.  angioplasty cephalic vein  5.  followup x1     Indications:  The patient is having difficulty with access. She is now her cardiac catheter. She comes in today for further evaluation of her fistula. Ultrasound identified an anastomotic problem as well as a branch.  Procedure:  The patient was identified in the holding area and taken to room 8.  The patient was then placed supine on the table and prepped and draped in the usual sterile fashion.  A time out was called.  Ultrasound was used to evaluate the fistula.  The vein was patent and compressible.  A digital ultrasound image was acquired.  The fistula was then accessed under ultrasound guidance using a micropuncture needle.  An 018 wire was then asvanced without resistance and a micropuncture sheath was placed.  Contrast injections were then performed through the sheath.  Findings:  The cephalic vein is patent throughout it's course. There were 2 focal areas of stenosis, on his do   Intervention:  Based on the above findings I decided to proceed with intervention. In order to do this, a second access was required. Therefore, the micropuncture sheath was removed. Manual pressure was held until hemostasis was achieved. Next, the more proximal cephalic vein was accessed under ultrasound guidance with a micropuncture needle. A 6 French sheath was placed. I did not give heparin as there was a small hematoma from the previous sheath removal. A 014 wire was advanced across the arteriovenous anastomosis and angioplasty was performed using a 4 x 40 balloon. I had difficulty  getting good images of this because the contrast was going out the large branch. Therefore, I decided to select the branch which was the distal cephalic vein. I deployed 3 three 6 mm nester coils to embolize this vein. I then obtained wire access and again across the arteriovenous anastomosis and performed additional imaging which revealed residual stenosis. I therefore upsized to a 5 mm balloon. This balloon was taken to grade pressure which correlated with the diameter of 5.4 mm. It was held for 1 minute. Completion arteriogram revealed significant improvement of the stenosis at the arterial venous anastomosis. With this, the decision was made to terminate the procedure. Catheters and wires were removed. The patient taken the holding area for sheath pull.  Impression:  #1  no evidence of venous stenosis  #2  stenosis at the arterial venous anastomosis which was dilated using a 5 mm balloon.  #3  a large branch was thought to be contributing to the non-maturation. This was embolized using 6 mm nester coils  #4  the fistula remains amenable to percutaneous intervention    V. Durene Cal, M.D. [facility] Specialists of [place] Office: [phone] Pager:  [phone]

## 2012-07-27 ENCOUNTER — Inpatient Hospital Stay (HOSPITAL_COMMUNITY)
Admission: AD | Admit: 2012-07-27 | Discharge: 2012-08-05 | DRG: 371 | Disposition: A | Discharge status: Home / Self Care | Payer: Medicare Other | Source: Other Acute Inpatient Hospital | Attending: Internal Medicine | Admitting: Internal Medicine

## 2012-07-27 DIAGNOSIS — I252 Old myocardial infarction: Secondary | ICD-10-CM

## 2012-07-27 DIAGNOSIS — A0472 Enterocolitis due to Clostridium difficile, not specified as recurrent: Secondary | ICD-10-CM

## 2012-07-27 DIAGNOSIS — Z87891 Personal history of nicotine dependence: Secondary | ICD-10-CM

## 2012-07-27 DIAGNOSIS — I120 Hypertensive chronic kidney disease with stage 5 chronic kidney disease or end stage renal disease: Secondary | ICD-10-CM | POA: Diagnosis present

## 2012-07-27 DIAGNOSIS — K630 Abscess of intestine: Principal | ICD-10-CM | POA: Diagnosis present

## 2012-07-27 DIAGNOSIS — Z85528 Personal history of other malignant neoplasm of kidney: Secondary | ICD-10-CM

## 2012-07-27 DIAGNOSIS — D62 Acute posthemorrhagic anemia: Secondary | ICD-10-CM | POA: Diagnosis present

## 2012-07-27 DIAGNOSIS — I509 Heart failure, unspecified: Secondary | ICD-10-CM | POA: Diagnosis present

## 2012-07-27 DIAGNOSIS — E119 Type 2 diabetes mellitus without complications: Secondary | ICD-10-CM

## 2012-07-27 DIAGNOSIS — K5732 Diverticulitis of large intestine without perforation or abscess without bleeding: Secondary | ICD-10-CM | POA: Diagnosis present

## 2012-07-27 DIAGNOSIS — N186 End stage renal disease: Secondary | ICD-10-CM | POA: Diagnosis present

## 2012-07-27 DIAGNOSIS — Z992 Dependence on renal dialysis: Secondary | ICD-10-CM

## 2012-07-27 DIAGNOSIS — J811 Chronic pulmonary edema: Secondary | ICD-10-CM | POA: Diagnosis present

## 2012-07-27 DIAGNOSIS — D649 Anemia, unspecified: Secondary | ICD-10-CM

## 2012-07-27 HISTORY — DX: Chronic obstructive pulmonary disease, unspecified: J44.9

## 2012-07-28 ENCOUNTER — Inpatient Hospital Stay (HOSPITAL_COMMUNITY): Payer: Medicare Other

## 2012-07-28 ENCOUNTER — Encounter (HOSPITAL_COMMUNITY): Payer: Self-pay | Admitting: Internal Medicine

## 2012-07-28 DIAGNOSIS — K5732 Diverticulitis of large intestine without perforation or abscess without bleeding: Secondary | ICD-10-CM | POA: Diagnosis present

## 2012-07-28 DIAGNOSIS — D649 Anemia, unspecified: Secondary | ICD-10-CM

## 2012-07-28 DIAGNOSIS — D62 Acute posthemorrhagic anemia: Secondary | ICD-10-CM | POA: Diagnosis present

## 2012-07-28 DIAGNOSIS — E119 Type 2 diabetes mellitus without complications: Secondary | ICD-10-CM | POA: Diagnosis present

## 2012-07-28 DIAGNOSIS — J811 Chronic pulmonary edema: Secondary | ICD-10-CM | POA: Diagnosis present

## 2012-07-28 DIAGNOSIS — N186 End stage renal disease: Secondary | ICD-10-CM

## 2012-07-28 LAB — GLUCOSE, CAPILLARY
Glucose-Capillary: 98 mg/dL (ref 70–99)
Glucose-Capillary: 111 mg/dL — ABNORMAL HIGH (ref 70–99)
Glucose-Capillary: 99 mg/dL (ref 70–99)

## 2012-07-28 LAB — CBC WITH DIFFERENTIAL/PLATELET
Eosinophils Absolute: 0.1 10*3/uL (ref 0.0–0.7)
Eosinophils Relative: 1 % (ref 0–5)
Hemoglobin: 11.1 g/dL — ABNORMAL LOW (ref 12.0–15.0)
Lymphs Abs: 1.4 10*3/uL (ref 0.7–4.0)
MCH: 30.5 pg (ref 26.0–34.0)
MCV: 107.4 fL — ABNORMAL HIGH (ref 78.0–100.0)
Monocytes Relative: 4 % (ref 3–12)
RBC: 3.64 MIL/uL — ABNORMAL LOW (ref 3.87–5.11)

## 2012-07-28 LAB — COMPREHENSIVE METABOLIC PANEL
AST: 24 U/L (ref 0–37)
Albumin: 2.7 g/dL — ABNORMAL LOW (ref 3.5–5.2)
Calcium: 9.1 mg/dL (ref 8.4–10.5)
Creatinine, Ser: 6.26 mg/dL — ABNORMAL HIGH (ref 0.50–1.10)
Total Protein: 6.7 g/dL (ref 6.0–8.3)

## 2012-07-28 LAB — HEPATITIS B SURFACE ANTIBODY,QUALITATIVE: Hep B S Ab: NEGATIVE

## 2012-07-28 LAB — HEPATITIS B SURFACE ANTIGEN: Hepatitis B Surface Ag: NEGATIVE

## 2012-07-28 MED ORDER — SODIUM CHLORIDE 0.9 % IJ SOLN
3.0000 mL | Freq: Two times a day (BID) | INTRAMUSCULAR | Status: DC
  Administered 2012-07-28 – 2012-08-02 (×8): 3 mL via INTRAVENOUS

## 2012-07-28 MED ORDER — ONDANSETRON HCL 4 MG/2ML IJ SOLN: 4.0000 mg | Freq: Four times a day (QID) | INTRAMUSCULAR | Status: DC | PRN

## 2012-07-28 MED ORDER — DARBEPOETIN ALFA-POLYSORBATE 100 MCG/0.5ML IJ SOLN
INTRAMUSCULAR | Status: AC
  Filled 2012-07-28: qty 0.5

## 2012-07-28 MED ORDER — PENTAFLUOROPROP-TETRAFLUOROETH EX AERO: 1.0000 "application " | INHALATION_SPRAY | CUTANEOUS | Status: DC | PRN

## 2012-07-28 MED ORDER — FLUTICASONE PROPIONATE HFA 110 MCG/ACT IN AERO
1.0000 | INHALATION_SPRAY | Freq: Two times a day (BID) | RESPIRATORY_TRACT | Status: DC
  Administered 2012-07-28 – 2012-08-04 (×12): 1 via RESPIRATORY_TRACT
  Filled 2012-07-28 (×2): qty 12

## 2012-07-28 MED ORDER — FUROSEMIDE 80 MG PO TABS
80.0000 mg | ORAL_TABLET | Freq: Two times a day (BID) | ORAL | Status: DC
  Administered 2012-07-28 – 2012-07-30 (×5): 80 mg via ORAL
  Filled 2012-07-28 (×9): qty 1

## 2012-07-28 MED ORDER — HYDRALAZINE HCL 20 MG/ML IJ SOLN
10.0000 mg | Freq: Once | INTRAMUSCULAR | Status: DC
  Filled 2012-07-28: qty 0.5

## 2012-07-28 MED ORDER — HYDROMORPHONE HCL PF 1 MG/ML IJ SOLN: 0.5000 mg | INTRAMUSCULAR | Status: DC | PRN

## 2012-07-28 MED ORDER — DOCUSATE SODIUM 283 MG RE ENEM: 1.0000 | ENEMA | RECTAL | Status: DC | PRN

## 2012-07-28 MED ORDER — ACETAMINOPHEN 325 MG PO TABS
650.0000 mg | ORAL_TABLET | Freq: Four times a day (QID) | ORAL | Status: DC | PRN
  Administered 2012-07-28 – 2012-07-31 (×4): 650 mg via ORAL
  Filled 2012-07-28 (×4): qty 2

## 2012-07-28 MED ORDER — CIPROFLOXACIN IN D5W 400 MG/200ML IV SOLN
400.0000 mg | INTRAVENOUS | Status: DC
  Administered 2012-07-28 – 2012-07-29 (×3): 400 mg via INTRAVENOUS
  Filled 2012-07-28 (×4): qty 200

## 2012-07-28 MED ORDER — DARBEPOETIN ALFA-POLYSORBATE 100 MCG/0.5ML IJ SOLN
100.0000 ug | Freq: Once | INTRAMUSCULAR | Status: AC
  Administered 2012-07-28: 100 ug via INTRAVENOUS
  Filled 2012-07-28: qty 0.5

## 2012-07-28 MED ORDER — ACETAMINOPHEN 650 MG RE SUPP: 650.0000 mg | Freq: Four times a day (QID) | RECTAL | Status: DC | PRN

## 2012-07-28 MED ORDER — SODIUM CHLORIDE 0.9 % IV SOLN
100.0000 mL | INTRAVENOUS | Status: DC | PRN
  Administered 2012-07-29: 100 mL via INTRAVENOUS

## 2012-07-28 MED ORDER — HEPARIN SODIUM (PORCINE) 1000 UNIT/ML DIALYSIS
1000.0000 [IU] | INTRAMUSCULAR | Status: DC | PRN
  Filled 2012-07-28: qty 1

## 2012-07-28 MED ORDER — ZOLPIDEM TARTRATE 5 MG PO TABS: 5.0000 mg | ORAL_TABLET | Freq: Every evening | ORAL | Status: DC | PRN

## 2012-07-28 MED ORDER — METRONIDAZOLE IN NACL 5-0.79 MG/ML-% IV SOLN
500.0000 mg | Freq: Three times a day (TID) | INTRAVENOUS | Status: DC
  Administered 2012-07-28 – 2012-07-30 (×6): 500 mg via INTRAVENOUS
  Filled 2012-07-28 (×9): qty 100

## 2012-07-28 MED ORDER — LIDOCAINE-PRILOCAINE 2.5-2.5 % EX CREA: 1.0000 "application " | TOPICAL_CREAM | CUTANEOUS | Status: DC | PRN

## 2012-07-28 MED ORDER — HEPARIN SODIUM (PORCINE) 1000 UNIT/ML DIALYSIS
20.0000 [IU]/kg | INTRAMUSCULAR | Status: DC | PRN
  Filled 2012-07-28: qty 2

## 2012-07-28 MED ORDER — INSULIN ASPART 100 UNIT/ML ~~LOC~~ SOLN
0.0000 [IU] | Freq: Every day | SUBCUTANEOUS | Status: DC
  Administered 2012-08-04: 1 [IU] via SUBCUTANEOUS

## 2012-07-28 MED ORDER — ACETAMINOPHEN 325 MG PO TABS
650.0000 mg | ORAL_TABLET | Freq: Four times a day (QID) | ORAL | Status: DC | PRN
  Administered 2012-07-28: 650 mg via ORAL
  Filled 2012-07-28: qty 2

## 2012-07-28 MED ORDER — ROPINIROLE HCL 1 MG PO TABS
1.0000 mg | ORAL_TABLET | Freq: Three times a day (TID) | ORAL | Status: DC
Start: 2012-07-28 — End: 2012-07-28

## 2012-07-28 MED ORDER — ONDANSETRON HCL 4 MG PO TABS: 4.0000 mg | ORAL_TABLET | Freq: Four times a day (QID) | ORAL | Status: DC | PRN

## 2012-07-28 MED ORDER — LIDOCAINE HCL (PF) 1 % IJ SOLN: 5.0000 mL | INTRAMUSCULAR | Status: DC | PRN

## 2012-07-28 MED ORDER — HYDROXYZINE HCL 25 MG PO TABS: 25.0000 mg | ORAL_TABLET | Freq: Three times a day (TID) | ORAL | Status: DC | PRN

## 2012-07-28 MED ORDER — CALCIUM CARBONATE 1250 MG/5ML PO SUSP
500.0000 mg | Freq: Four times a day (QID) | ORAL | Status: DC | PRN
  Administered 2012-07-29: 500 mg via ORAL
  Filled 2012-07-28: qty 5

## 2012-07-28 MED ORDER — SODIUM CHLORIDE 0.9 % IV SOLN
125.0000 mg | INTRAVENOUS | Status: AC
  Administered 2012-07-28 – 2012-08-05 (×5): 125 mg via INTRAVENOUS
  Filled 2012-07-28 (×7): qty 10

## 2012-07-28 MED ORDER — SODIUM CHLORIDE 0.9 % IV SOLN: 100.0000 mL | INTRAVENOUS | Status: DC | PRN

## 2012-07-28 MED ORDER — SORBITOL 70 % SOLN: 30.0000 mL | Status: DC | PRN

## 2012-07-28 MED ORDER — CARVEDILOL 25 MG PO TABS
25.0000 mg | ORAL_TABLET | Freq: Every day | ORAL | Status: DC
  Administered 2012-07-28 – 2012-07-29 (×2): 25 mg via ORAL
  Filled 2012-07-28 (×3): qty 1

## 2012-07-28 MED ORDER — INSULIN ASPART 100 UNIT/ML ~~LOC~~ SOLN
0.0000 [IU] | Freq: Three times a day (TID) | SUBCUTANEOUS | Status: DC
  Administered 2012-07-31: 2 [IU] via SUBCUTANEOUS
  Administered 2012-08-03: 3 [IU] via SUBCUTANEOUS
  Administered 2012-08-04: 1 [IU] via SUBCUTANEOUS

## 2012-07-28 MED ORDER — ROPINIROLE HCL 1 MG PO TABS
1.0000 mg | ORAL_TABLET | Freq: Three times a day (TID) | ORAL | Status: DC
  Administered 2012-07-28 – 2012-08-05 (×20): 1 mg via ORAL
  Filled 2012-07-28 (×29): qty 1

## 2012-07-28 MED ORDER — CAMPHOR-MENTHOL 0.5-0.5 % EX LOTN
1.0000 "application " | TOPICAL_LOTION | Freq: Three times a day (TID) | CUTANEOUS | Status: DC | PRN
  Filled 2012-07-28: qty 222

## 2012-07-28 MED ORDER — DARBEPOETIN ALFA-POLYSORBATE 200 MCG/0.4ML IJ SOLN: 200.0000 ug | INTRAMUSCULAR | Status: DC

## 2012-07-28 MED ORDER — ALBUTEROL SULFATE (5 MG/ML) 0.5% IN NEBU
2.5000 mg | INHALATION_SOLUTION | RESPIRATORY_TRACT | Status: DC | PRN
  Administered 2012-07-28: 2.5 mg via RESPIRATORY_TRACT
  Filled 2012-07-28: qty 0.5

## 2012-07-28 MED ORDER — DARBEPOETIN ALFA-POLYSORBATE 200 MCG/0.4ML IJ SOLN: 200.0000 ug | Freq: Once | INTRAMUSCULAR | Status: DC

## 2012-07-28 MED ORDER — ALTEPLASE 2 MG IJ SOLR
2.0000 mg | Freq: Once | INTRAMUSCULAR | Status: AC | PRN
  Filled 2012-07-28: qty 2

## 2012-07-28 NOTE — Consult Note (Signed)
[facility] Renal Consultation Note    Indication for Consultation:  Management of ESRD/hemodialysis; anemia, hypertension/volume and secondary hyperparathyroidism  HPI: [name] is a 71 y.o. AA female with ESRD secondary to S and collapsing FSG and hx of HTN, ASCVD and is s/p right nephrectomy for RCC on hemodialysis since August 2013 who presented to the [facility] ED yesterday and after evaluation was transferred to [facility] for treatment of sigmoid diverticulitis with possible abscess.  She miss her usual Tuesday dialysis and has not been dialyzed since Saturday 10/26.  She has been evaluated by surgery who recommends IV antibioitics, bowel rest and IR eval for a percutaneous drain.  She states the abdominal pain began about a week again and got much worse yesterday and her PCP told her to go the [facility] ED. She has vomited several times and is nauseated now.  She feels SOB and chronically sleeps on 2 pills.  She also has chronic diarrhea with hx of recurrent c doff last  + in September.  She makes urine and denies dysuria or urgency.  She has no CP and has an occasional nonproductive cough.  She does NOT take pills for her diabetes as long as her BS < 150.  Her dialysis AVF has not been used yet. She feels weak and states the pain medicine makes her feel funny. She denies neuropathic sx, but it is noted she is on requip.   Past Medical History  Diagnosis Date  . Carotid artery occlusion   . ESRD (end stage renal disease)   . C. difficile diarrhea   . Cancer     Renal Cell Carcinoma  . Hypertension   . Diabetes mellitus     Type II  . ASCVD (arteriosclerotic cardiovascular disease)   . Myocardial infarction   . PCI (pneumatosis cystoides intestinalis)   . Atrial fibrillation April  2013  . CHF (congestive heart failure)   . Pulmonary hypertension   . CKD (chronic kidney disease)   . Anemia   . Increased PTH level    Past Surgical History  Procedure Date  .  Nephrectomy     Right, for Renal cell carcinoma  . Carotid endarterectomy   . Av fistula placement Jan. 7, 2011    Right  upper arm by Dr. Hart Rochester  . Hd catheter Aug. 22, 2013    [facility]   Family History  Problem Relation Age of Onset  . Cancer Mother     pancreatic  . Stroke Father   . Coronary artery disease Father   . Cancer Brother     Brain  . Kidney disease Brother     Kidney stones   Social History:  reports that she quit smoking about 8 years ago. Her smoking use included Cigarettes. She quit after 50 years of use. She does not have any smokeless tobacco history on file. She reports that she drinks alcohol. She reports that she does not use illicit drugs. Allergies  Allergen Reactions  . Morphine And Related Nausea And Vomiting   Prior to Admission medications   Medication Sig Start Date End Date Taking? Authorizing Provider  albuterol (PROVENTIL) (2.5 MG/3ML) 0.083% nebulizer solution Take 2.5 mg by nebulization every 6 (six) hours as needed. For shortness of breath   Yes Historical Provider, MD  calcium carbonate 200 MG capsule Take 200 mg by mouth 2 (two) times daily with a meal.    Yes Historical Provider, MD  carvedilol (COREG) 25 MG tablet  Take 25 mg by mouth daily.   Yes Historical Provider, MD  ezetimibe (ZETIA) 10 MG tablet Take 10 mg by mouth daily.   Yes Historical Provider, MD  Fe Fum-FA-B Cmp-C-Zn-Mg-Mn-Cu (HEMOCYTE PLUS PO) Take 1 tablet by mouth daily.    Yes Historical Provider, MD  fluticasone (FLOVENT HFA) 110 MCG/ACT inhaler Inhale 1 puff into the lungs 2 (two) times daily.   Yes Historical Provider, MD  furosemide (LASIX) 80 MG tablet Take 80 mg by mouth 2 (two) times daily.   Yes Historical Provider, MD  rOPINIRole (REQUIP) 1 MG tablet Take 1 mg by mouth 3 (three) times daily.   Yes Historical Provider, MD  glipiZIDE (GLUCOTROL XL) 2.5 MG 24 hr tablet Take 2.5 mg by mouth daily as needed. For blood sugars over 150    Historical Provider, MD    Current Facility-Administered Medications  Medication Dose Route Frequency Provider Last Rate Last Dose  . acetaminophen (TYLENOL) tablet 650 mg  650 mg Oral Q6H PRN Eduard Clos, MD   650 mg at 07/28/12 1610   Or  . acetaminophen (TYLENOL) suppository 650 mg  650 mg Rectal Q6H PRN Eduard Clos, MD      . albuterol (PROVENTIL) (5 MG/ML) 0.5% nebulizer solution 2.5 mg  2.5 mg Nebulization Q4H PRN Eduard Clos, MD   2.5 mg at 07/28/12 1056  . carvedilol (COREG) tablet 25 mg  25 mg Oral Daily Eduard Clos, MD   25 mg at 07/28/12 0929  . ciprofloxacin (CIPRO) IVPB 400 mg  400 mg Intravenous Q24H Nelia Shi, MD   400 mg at 07/28/12 9604  . fluticasone (FLOVENT HFA) 110 MCG/ACT inhaler 1 puff  1 puff Inhalation BID Eduard Clos, MD   1 puff at 07/28/12 0942  . furosemide (LASIX) tablet 80 mg  80 mg Oral BID Eduard Clos, MD   80 mg at 07/28/12 5409  . hydrALAZINE (APRESOLINE) injection 10 mg  10 mg Intravenous Once Leanne Chang, NP      . HYDROmorphone (DILAUDID) injection 0.5 mg  0.5 mg Intravenous Q4H PRN Eduard Clos, MD      . insulin aspart (novoLOG) injection 0-5 Units  0-5 Units Subcutaneous QHS Eduard Clos, MD      . insulin aspart (novoLOG) injection 0-9 Units  0-9 Units Subcutaneous TID WC Eduard Clos, MD      . metroNIDAZOLE (FLAGYL) IVPB 500 mg  500 mg Intravenous Q8H Eduard Clos, MD   500 mg at 07/28/12 8119  . ondansetron (ZOFRAN) tablet 4 mg  4 mg Oral Q6H PRN Eduard Clos, MD       Or  . ondansetron [facility]) injection 4 mg  4 mg Intravenous Q6H PRN Eduard Clos, MD      . rOPINIRole (REQUIP) tablet 1 mg  1 mg Oral TID Nelia Shi, MD   1 mg at 07/28/12 0929  . sodium chloride 0.9 % injection 3 mL  3 mL Intravenous Q12H Eduard Clos, MD   3 mL at 07/28/12 0211  . DISCONTD: rOPINIRole (REQUIP) tablet 1 mg  1 mg Oral TID Eduard Clos, MD       Labs: Basic Metabolic  Panel:  Lab 07/28/12 0136  NA 135  K 4.8  CL 91*  CO2 30  GLUCOSE 110*  BUN 57*  CREATININE 6.26*  CALCIUM 9.1  ALB --  PHOS --   Liver Function Tests:  Lab 07/28/12  0136  AST 24  ALT 12  ALKPHOS 80  BILITOT 0.2*  PROT 6.7  ALBUMIN 2.7*  CBC:  Lab 07/28/12 0136  WBC 11.4*  NEUTROABS 9.5*  HGB 11.1*  HCT 39.1  MCV 107.4*  PLT 237  ROS: As per HPI otherwise negative.   Physical Exam: Filed Vitals:   07/27/12 2303 07/28/12 0521 07/28/12 0942 07/28/12 0944  BP: 174/80 191/93  173/82  Pulse: 81 102  95  Temp: 98.2 F (36.8 C) 98.3 F (36.8 C)  97.7 F (36.5 C)  TempSrc: Oral Oral  Oral  Resp: 17 17  18   Weight: 80.377 kg (177 lb 3.2 oz)     SpO2: 95% 97% 93% 93%     General: Well developed, eldery, in no acute distress but has mild nausea. Head: Normocephalic, atraumatic, sclera non-icteric, mucus membranes are moist Neck: Supple. JVD not elevated. Lungs: Clear bilaterally to auscultation without wheezes, rales, or rhonchi. Breathing is unlabored. Heart: RRR with S1 S2. No murmurs, rubs, or gallops appreciated. Abdomen: Soft, left and right LQ normoactive bowel sounds. No rebound/guarding. No obvious abdominal masses. Lower extremities: without edema or ischemic changes, no open wounds  Neuro: Alert and oriented X 3. Moves all extremities spontaneously. Psych:  Responds to questions appropriately with a normal affect. Dialysis Access: left I-J active; right upper AVF maturing  Dialysis Orders: Center: [facility] T[facility] Optiflux 160. EDW 79.5  HD Bath 2K 2.25 Ca Time 4 Heparin tight. Access right upper AVF and left  I-J   400/A 1.5 Zemplar none mcg IV/HD Epogen 28K - changed to 21K 10/28  Units IV/HD  Venofer  50/week  Assessment/Plan: 1. Sigmoid diverticulitis with abscess - IV cipro and flagyl, bowel rest and IR to place perc; hx c diff - stool for c diff PCR checked. 2.  ESRD -  TTS off schedule; likely to be here a while. HD today; last intervention with AVF by  Dr. Myra Gianotti 10/9 which included PTA of A-V stenosis and embolization of large branch that was contributing to nonmaturation. Using catheter for now.  3.  Hypertension/volume  - BP up from usual.  Volume not that high by weights or cxr; goal 3 liters today; not sure lasix is beneficial. 4.  Anemia  - Hgb 11.4 10/24; has been on max Epo; Fe deficiency 14%  Ferritin 464; needs less ESA and more Fe; will replete 125 x 5; reduce Aranesp to 100 5.  Metabolic bone disease -  iPTH 454 no zemplar; last oupt P 6.1 - resume binders when taking pos 6.  Nutrition - NPO for now 7. DM - per primary - SSI 8. ASCVD - on BB  Sheffield Slider, PA-C [facility] [phone] 07/28/2012, 11:21 AM

## 2012-07-28 NOTE — Progress Notes (Signed)
[facility]                                          Progress note  Pt is a 71yo female with history of ESRD on hemodialysis who presented to the [facility] ED with complaints of worsening abdominal pain.A CT scan of the abdomen & pelvis done showed sigmoid diverticulitis with possible abscess formation and patient was been transferred to [facility] cone since patient is a dialysis patient. Surgery was consulted upon admission to [facility] and [facility]CS saw pt and recommended drain to be placed per IR.  At the time of my visit pt c/o SOB and mild accessory muscle use on exam, decreased air mov't and no wheezes. Nebs odered, CXR ordered and renal consulted for dialysis. Will o/w continue current management plan as per Dr Toniann Fail and follow.   Roanna Epley [facility] [phone]

## 2012-07-28 NOTE — H&P (Signed)
[name] is an 71 y.o. female.  Patient was seen and examined on July 28, 2012. PCP - Dr. Harrell Lark in [place].  Patient was transferred from [facility] after CT scan of the abdomen and pelvis showed sigmoid diverticulitis with possible abscess formation. Patient is a dialysis patient. Chief Complaint: Abdominal pain. HPI: 71 year-old female with history of ESRD on hemodialysis presented to the ER to [facility] with complaints of worsening abdominal pain. Patient has been having abdominal pain worsening over the last 2 weeks but last 2 days it has become severe. The pain is mostly in the left lower quadrant suprapubic area. Patient has been having associated diarrhea with nausea and vomiting. 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 Patient denies any chest pain or shortness of breath fever chills. CT abdomen pelvis done showed sigmoid diverticulitis with possible abscess formation and patient has been transferred to [facility] since patient is a dialysis patient. Presently patient is pain-free after patient had received pain relief medications at [place].  Past Medical History  Diagnosis Date  . Carotid artery occlusion   . ESRD (end stage renal disease)   . C. difficile diarrhea   . Cancer     Renal Cell Carcinoma  . Hypertension   . Diabetes mellitus     Type II  . ASCVD (arteriosclerotic cardiovascular disease)   . Myocardial infarction   . PCI (pneumatosis cystoides intestinalis)   . Atrial fibrillation April  2013  . CHF (congestive heart failure)   . Pulmonary hypertension   . CKD (chronic kidney disease)   . Anemia   . Increased PTH level     Past Surgical History  Procedure Date  . Nephrectomy     Right, for Renal cell carcinoma  . Carotid endarterectomy   . Av fistula placement Jan. 7, 2011    Right  upper arm by Dr. Hart Rochester  . Hd catheter Aug. 22, 2013    [facility]    Family History  Problem Relation  Age of Onset  . Cancer Mother     pancreatic  . Stroke Father   . Coronary artery disease Father   . Cancer Brother     Brain  . Kidney disease Brother     Kidney stones   Social History:  reports that she quit smoking about 8 years ago. Her smoking use included Cigarettes. She quit after 50 years of use. She does not have any smokeless tobacco history on file. She reports that she drinks alcohol. She reports that she does not use illicit drugs.  Allergies:  Allergies  Allergen Reactions  . Morphine And Related Nausea And Vomiting    Medications Prior to Admission  Medication Sig Dispense Refill  . albuterol (PROVENTIL) (2.5 MG/3ML) 0.083% nebulizer solution Take 2.5 mg by nebulization every 6 (six) hours as needed. For shortness of breath      . calcium carbonate 200 MG capsule Take 200 mg by mouth 2 (two) times daily with a meal.       . carvedilol (COREG) 25 MG tablet Take 25 mg by mouth 2 (two) times daily with a meal.       . Ezetimibe (ZETIA PO) Take 10 mg by mouth daily.      . Fe Fum-FA-B Cmp-C-Zn-Mg-Mn-Cu (HEMOCYTE PLUS PO) Take 1 tablet by mouth daily.       . fluticasone (FLOVENT HFA) 110 MCG/ACT inhaler Inhale 1 puff into the lungs 2 (two) times  daily.      . furosemide (LASIX) 80 MG tablet Take 80 mg by mouth 2 (two) times daily.      Marland Kitchen glipiZIDE (GLUCOTROL XL) 2.5 MG 24 hr tablet Take 2.5 mg by mouth daily as needed. For blood sugars over 150      . rOPINIRole (REQUIP) 1 MG tablet Take 1 mg by mouth 3 (three) times daily.        Results for orders placed during the hospital encounter of 07/27/12 (from the past 48 hour(s))  GLUCOSE, CAPILLARY     Status: Abnormal   Collection Time   07/27/12 11:36 PM      Component Value Range Comment   Glucose-Capillary 118 (*) 70 - 99 mg/dL    No results found.  Review of Systems  Constitutional: Negative.   HENT: Negative.   Eyes: Negative.   Respiratory: Negative.   Cardiovascular: Negative.   Gastrointestinal: Positive  for nausea, vomiting, abdominal pain and diarrhea.  Genitourinary: Negative.   Musculoskeletal: Negative.   Skin: Negative.   Neurological: Negative.   Endo/Heme/Allergies: Negative.   Psychiatric/Behavioral: Negative.     Blood pressure 174/80, pulse 81, temperature 98.2 F (36.8 C), temperature source Oral, resp. rate 17, weight 80.377 kg (177 lb 3.2 oz), SpO2 95.00%. Physical Exam  Constitutional: She is oriented to person, place, and time. She appears well-developed and well-nourished. No distress.  HENT:  Head: Normocephalic and atraumatic.  Right Ear: External ear normal.  Left Ear: External ear normal.  Nose: Nose normal.  Mouth/Throat: Oropharynx is clear and moist. No oropharyngeal exudate.  Eyes: Conjunctivae normal are normal. Pupils are equal, round, and reactive to light. Right eye exhibits no discharge. Left eye exhibits no discharge. No scleral icterus.  Neck: Normal range of motion. Neck supple.  Cardiovascular: Normal rate and regular rhythm.   Respiratory: Effort normal and breath sounds normal. No respiratory distress. She has no wheezes. She has no rales.  GI: Soft. Bowel sounds are normal. She exhibits no distension. There is no tenderness. There is no rebound and no guarding.  Musculoskeletal: Normal range of motion. She exhibits no edema and no tenderness.  Neurological: She is alert and oriented to person, place, and time.       Moves all extremities.  Skin: Skin is warm and dry. She is not diaphoretic.     Assessment/Plan #1. 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 Patient placed on Cipro and Flagyl. Check stool for C. difficile. Dr. Janee Morn of surgery has been consulted. #2. ESRD on hemodialysis - patient missed her dialysis yesterday due to the pain. Patient usually gets dialyzed on Tuesday Thursday and Saturdays. Patient presently is not short of breath.  Please consult nephrology for dialysis in a.m. #3. CAD status post stenting - presently has no chest pain. Patient states she was taken off Plavix one year ago. Continue Coreg. #4. Diabetes mellitus2 - since patient is n.p.o. we will hold Glucotrol for now and check CBG every 4 with sliding-scale coverage. #5. Left kidney lesion as per the CAT scan done at [facility] - this will need further workup with MRI.  Patient's labs done at [facility] showed a WBC count of 11.8, hemoglobin of 11.3 platelets of 253. Sodium of 142 potassium of 3.9, carbon dioxide 31 glucose 113, BUN 56 creatinine 6.7 AST 31 ALT 19 total protein 7.1 UA was showing leukocyte esterase 2+ with WBC 1020. CT abdomen  and pelvis was showing distal sigmoid diverticulitis with small possible intramural or extra is followup recommended as perforated carcinoma can have a similar appearance and also a 2.2 cm complex left upper pole relation which will need further study.  CODE STATUS - full code.    Caden Fukushima N. 07/28/2012, 12:34 AM

## 2012-07-28 NOTE — Progress Notes (Signed)
ANTIBIOTIC CONSULT NOTE - INITIAL  Pharmacy Consult for ciprofloxacin Indication: diverticulitis  Allergies  Allergen Reactions  . Morphine And Related Nausea And Vomiting    Patient Measurements: Weight: 177 lb 3.2 oz (80.377 kg) Adjusted Body Weight:   Vital Signs: Temp: 98.2 F (36.8 C) (10/29 2303) Temp src: Oral (10/29 2303) BP: 174/80 mmHg (10/29 2303) Pulse Rate: 81  (10/29 2303) Intake/Output from previous day:   Intake/Output from this shift:    Labs: No results found for this basename: WBC:3,HGB:3,PLT:3,LABCREA:3,CREATININE:3 in the last 72 hours The CrCl is unknown because both a height and weight (above a minimum accepted value) are required for this calculation. No results found for this basename: VANCOTROUGH:2,VANCOPEAK:2,VANCORANDOM:2,GENTTROUGH:2,GENTPEAK:2,GENTRANDOM:2,TOBRATROUGH:2,TOBRAPEAK:2,TOBRARND:2,AMIKACINPEAK:2,AMIKACINTROU:2,AMIKACIN:2, in the last 72 hours   Microbiology: No results found for this or any previous visit (from the past 720 hour(s)).  Medical History: Past Medical History  Diagnosis Date  . Carotid artery occlusion   . ESRD (end stage renal disease)   . C. difficile diarrhea   . Cancer     Renal Cell Carcinoma  . Hypertension   . Diabetes mellitus     Type II  . ASCVD (arteriosclerotic cardiovascular disease)   . Myocardial infarction   . PCI (pneumatosis cystoides intestinalis)   . Atrial fibrillation April  2013  . CHF (congestive heart failure)   . Pulmonary hypertension   . CKD (chronic kidney disease)   . Anemia   . Increased PTH level     Medications:  Scheduled:    . carvedilol  25 mg Oral Daily  . fluticasone  1 puff Inhalation BID  . furosemide  80 mg Oral BID  . insulin aspart  0-5 Units Subcutaneous QHS  . insulin aspart  0-9 Units Subcutaneous TID WC  . metronidazole  500 mg Intravenous Q8H  . rOPINIRole  1 mg Oral TID  . sodium chloride  3 mL Intravenous Q12H   Infusions:   Assessment: 71 yo  female with ESRD will be started on ciprofloxacin for diverticulitis.  Goal of Therapy:    Plan:  1) cipro 400mg  iv q24h.  Shaheim Mahar, Tsz-Yin 07/28/2012,12:34 AM

## 2012-07-28 NOTE — Consult Note (Signed)
I have seen and examined this patient and agree with plan as outlined by M. Doran Durand, PA-C. Patient was seen on dialysis and the procedure was supervised. BFR 400 Via PC BP is 102/55.  Patient appears to be tolerating treatment well   Janell Keeling A,MD 07/28/2012 2:28 PM

## 2012-07-28 NOTE — Progress Notes (Signed)
Utilization review completed.  

## 2012-07-28 NOTE — Progress Notes (Signed)
IR states abscess too small to drain.   COn't NPO/IV abx Will con't to follow

## 2012-07-28 NOTE — Consult Note (Signed)
Reason for Consult:Sigmoid diverticulitis with abscess Referring Physician: [name] is an 71 y.o. female.  HPI: Patient has a ten-day history of suprapubic and lower abdominal pain. It is gradually worsened. She was evaluated at [facility] earlier today. CT scan of the abdomen and pelvis was done and by report, it shows sigmoid diverticulitis with abscess. She has been more comfortable since receiving some pain medication. She had some recurrence of the pain after urinating. Of note, she has had multiple admissions this year for pneumonia and C. Difficile colitis. She is a renal failure patient on dialysis.  Past Medical History  Diagnosis Date  . Carotid artery occlusion   . ESRD (end stage renal disease)   . C. difficile diarrhea   . Cancer     Renal Cell Carcinoma  . Hypertension   . Diabetes mellitus     Type II  . ASCVD (arteriosclerotic cardiovascular disease)   . Myocardial infarction   . PCI (pneumatosis cystoides intestinalis)   . Atrial fibrillation April  2013  . CHF (congestive heart failure)   . Pulmonary hypertension   . CKD (chronic kidney disease)   . Anemia   . Increased PTH level     Past Surgical History  Procedure Date  . Nephrectomy     Right, for Renal cell carcinoma  . Carotid endarterectomy   . Av fistula placement Jan. 7, 2011    Right  upper arm by Dr. Hart [place]  . Hd catheter Aug. 22, 2013    [facility]    Family History  Problem Relation Age of Onset  . Cancer Mother     pancreatic  . Stroke Father   . Coronary artery disease Father   . Cancer Brother     Brain  . Kidney disease Brother     Kidney stones    Social History:  reports that she quit smoking about 8 years ago. Her smoking use included Cigarettes. She quit after 50 years of use. She does not have any smokeless tobacco history on file. She reports that she drinks alcohol. She reports that she does not use illicit drugs.  Allergies:  Allergies    Allergen Reactions  . Morphine And Related Nausea And Vomiting    Medications:  Prior to Admission:  Prescriptions prior to admission  Medication Sig Dispense Refill  . albuterol (PROVENTIL) (2.5 MG/3ML) 0.083% nebulizer solution Take 2.5 mg by nebulization every 6 (six) hours as needed. For shortness of breath      . calcium carbonate 200 MG capsule Take 200 mg by mouth 2 (two) times daily with a meal.       . carvedilol (COREG) 25 MG tablet Take 25 mg by mouth 2 (two) times daily with a meal.       . Ezetimibe (ZETIA PO) Take 10 mg by mouth daily.      . Fe Fum-FA-B Cmp-C-Zn-Mg-Mn-Cu (HEMOCYTE PLUS PO) Take 1 tablet by mouth daily.       . fluticasone (FLOVENT HFA) 110 MCG/ACT inhaler Inhale 1 puff into the lungs 2 (two) times daily.      . furosemide (LASIX) 80 MG tablet Take 80 mg by mouth 2 (two) times daily.      Marland Kitchen glipiZIDE (GLUCOTROL XL) 2.5 MG 24 hr tablet Take 2.5 mg by mouth daily as needed. For blood sugars over 150      . rOPINIRole (REQUIP) 1 MG tablet Take 1 mg by mouth 3 (three) times daily.  Results for orders placed during the hospital encounter of 07/27/12 (from the past 48 hour(s))  GLUCOSE, CAPILLARY     Status: Abnormal   Collection Time   07/27/12 11:36 PM      Component Value Range Comment   Glucose-Capillary 118 (*) 70 - 99 mg/dL   CBC WITH DIFFERENTIAL     Status: Abnormal   Collection Time   07/28/12  1:36 AM      Component Value Range Comment   WBC 11.4 (*) 4.0 - 10.5 K/uL    RBC 3.64 (*) 3.87 - 5.11 MIL/uL    Hemoglobin 11.1 (*) 12.0 - 15.0 g/dL    HCT 81.1  91.4 - 78.2 %    MCV 107.4 (*) 78.0 - 100.0 fL    MCH 30.5  26.0 - 34.0 pg    MCHC 28.4 (*) 30.0 - 36.0 g/dL    RDW 95.6 (*) 21.3 - 15.5 %    Platelets 237  150 - 400 K/uL    Neutrophils Relative 83 (*) 43 - 77 %    Neutro Abs 9.5 (*) 1.7 - 7.7 K/uL    Lymphocytes Relative 12  12 - 46 %    Lymphs Abs 1.4  0.7 - 4.0 K/uL    Monocytes Relative 4  3 - 12 %    Monocytes Absolute 0.5  0.1  - 1.0 K/uL    Eosinophils Relative 1  0 - 5 %    Eosinophils Absolute 0.1  0.0 - 0.7 K/uL    Basophils Relative 0  0 - 1 %    Basophils Absolute 0.0  0.0 - 0.1 K/uL     No results found.  Review of Systems  Constitutional: Positive for malaise/fatigue.  HENT: Negative.   Eyes: Negative.   Respiratory:       Frequent pneumonia  Cardiovascular: Negative.   Gastrointestinal: Positive for abdominal pain.  Genitourinary: Positive for dysuria.  Musculoskeletal: Negative.   Skin: Negative.   Neurological: Negative.   Endo/Heme/Allergies: Negative.    Blood pressure 174/80, pulse 81, temperature 98.2 F (36.8 C), temperature source Oral, resp. rate 17, weight 80.377 kg (177 lb 3.2 oz), SpO2 95.00%. Physical Exam  Constitutional: She is oriented to person, place, and time. She appears well-developed and well-nourished. No distress.  HENT:  Head: Normocephalic.  Eyes: Pupils are equal, round, and reactive to light.  Neck: Normal range of motion. Neck supple.  Cardiovascular: Normal rate, regular rhythm and normal heart sounds.   Respiratory: Effort normal. No respiratory distress. She has no wheezes.  GI: Soft. She exhibits no distension. There is tenderness. There is guarding. There is no rebound.       Tender in the suprapubic area with mild voluntary guarding intermittently  Musculoskeletal: Normal range of motion.  Neurological: She is alert and oriented to person, place, and time.    Assessment/Plan: Sigmoid diverticulitis with abscess: Recommend continuing IV antibiotics and bowel rest. We will order interventional radiology for percutaneous drain. Plan was discussed in detail with the patient. I advised her if she does not improve, she may need surgery and a colostomy. We will follow with you. [name] E 07/28/2012, 2:21 AM

## 2012-07-28 NOTE — Progress Notes (Signed)
Patient ID: [name], female   DOB: April 26, 1941, 71 y.o.   MRN: [id number]    Subjective: Abd pain better but not resolved, denies nausea/vomiting, feels hungry  Objective: Vital signs in last 24 hours: Temp:  [98.2 F (36.8 C)-98.3 F (36.8 C)] 98.3 F (36.8 C) (10/30 0521) Pulse Rate:  [81-102] 102  (10/30 0521) Resp:  [17] 17  (10/30 0521) BP: (174-191)/(80-93) 191/93 mmHg (10/30 0521) SpO2:  [95 %-97 %] 97 % (10/30 0521) Weight:  [177 lb 3.2 oz (80.377 kg)] 177 lb 3.2 oz (80.377 kg) (10/29 2303) Last BM Date: 07/27/12  Intake/Output from previous day:   Intake/Output this shift:    PE: Abd: soft, tender in lower abd, +BS, no masses  Lab Results:   [facility] 07/28/12 0136  WBC 11.4*  HGB 11.1*  HCT 39.1  PLT 237   BMET  Basename 07/28/12 0136  NA 135  K 4.8  CL 91*  CO2 30  GLUCOSE 110*  BUN 57*  CREATININE 6.26*  CALCIUM 9.1   PT/INR No results found for this basename: LABPROT:2,INR:2 in the last 72 hours CMP     Component Value Date/Time   NA 135 07/28/2012 0136   K 4.8 07/28/2012 0136   CL 91* 07/28/2012 0136   CO2 30 07/28/2012 0136   GLUCOSE 110* 07/28/2012 0136   BUN 57* 07/28/2012 0136   CREATININE 6.26* 07/28/2012 0136   CALCIUM 9.1 07/28/2012 0136   PROT 6.7 07/28/2012 0136   ALBUMIN 2.7* 07/28/2012 0136   AST 24 07/28/2012 0136   ALT 12 07/28/2012 0136   ALKPHOS 80 07/28/2012 0136   BILITOT 0.2* 07/28/2012 0136   GFRNONAA 6* 07/28/2012 0136   GFRAA 7* 07/28/2012 0136   Lipase  No results found for this basename: lipase       Studies/Results: No results found.  Anti-infectives: Anti-infectives     Start     Dose/Rate Route Frequency Ordered Stop   07/28/12 0130   ciprofloxacin (CIPRO) IVPB 400 mg        400 mg 200 mL/hr over 60 Minutes Intravenous Every 24 hours 07/28/12 0035     07/28/12 0100   metroNIDAZOLE (FLAGYL) IVPB 500 mg        500 mg 100 mL/hr over 60 Minutes Intravenous Every 8 hours 07/28/12 0033               Assessment/Plan 1. Sigmoid Diverticulitis with abscess:  For perc drain today in IR  --keep NPO x ice chips  --IV abx  --await perc drain cultures  --up ad lib  2. ESRD: per nephrology  LOS: 1 day    [name] 07/28/2012

## 2012-07-29 DIAGNOSIS — J811 Chronic pulmonary edema: Secondary | ICD-10-CM

## 2012-07-29 LAB — URINE CULTURE: Colony Count: >=100000

## 2012-07-29 LAB — CBC
HCT: 40.5 % (ref 36.0–46.0)
Hemoglobin: 11.3 g/dL — ABNORMAL LOW (ref 12.0–15.0)
MCH: 30.5 pg (ref 26.0–34.0)
MCHC: 27.9 g/dL — ABNORMAL LOW (ref 30.0–36.0)
MCV: 109.5 fL — ABNORMAL HIGH (ref 78.0–100.0)
Platelets: 225 10*3/uL (ref 150–400)
RBC: 3.7 MIL/uL — ABNORMAL LOW (ref 3.87–5.11)
RDW: 18.3 % — ABNORMAL HIGH (ref 11.5–15.5)
WBC: 9.1 10*3/uL (ref 4.0–10.5)

## 2012-07-29 LAB — RENAL FUNCTION PANEL
Albumin: 2.7 g/dL — ABNORMAL LOW (ref 3.5–5.2)
BUN: 21 mg/dL (ref 6–23)
CO2: 28 mEq/L (ref 19–32)
Calcium: 9 mg/dL (ref 8.4–10.5)
Chloride: 97 mEq/L (ref 96–112)
Creatinine, Ser: 3.54 mg/dL — ABNORMAL HIGH (ref 0.50–1.10)
GFR calc Af Amer: 14 mL/min — ABNORMAL LOW (ref >90)
GFR calc non Af Amer: 12 mL/min — ABNORMAL LOW (ref >90)
Glucose, Bld: 82 mg/dL (ref 70–99)
Phosphorus: 6.4 mg/dL — ABNORMAL HIGH (ref 2.3–4.6)
Potassium: 3.8 mEq/L (ref 3.5–5.1)
Sodium: 139 mEq/L (ref 135–145)

## 2012-07-29 LAB — GLUCOSE, CAPILLARY
Glucose-Capillary: 90 mg/dL (ref 70–99)
Glucose-Capillary: 80 mg/dL (ref 70–99)
Glucose-Capillary: 139 mg/dL — ABNORMAL HIGH (ref 70–99)

## 2012-07-29 LAB — HEPATITIS B CORE ANTIBODY, TOTAL: Hep B Core Total Ab: NEGATIVE

## 2012-07-29 MED ORDER — HEPARIN SODIUM (PORCINE) 1000 UNIT/ML DIALYSIS
20.0000 [IU]/kg | INTRAMUSCULAR | Status: DC | PRN
  Filled 2012-07-29: qty 2

## 2012-07-29 MED ORDER — RENA-VITE PO TABS
1.0000 | ORAL_TABLET | Freq: Every day | ORAL | Status: DC
  Administered 2012-07-29 – 2012-08-04 (×7): 1 via ORAL
  Filled 2012-07-29 (×8): qty 1

## 2012-07-29 NOTE — Progress Notes (Addendum)
[facility] PROGRESS NOTE  [name]:[id number] DOB: 07/26/1941 DOA: 07/27/2012 PCP: [name], MD  Assessment/Plan: #1. 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 ESRD on hemodialysis - patient missed her dialysis 10/29 due to the pain. Patient usually gets dialyzed on Tuesday Thursday and Saturdays. -she was SOB on 10/30, CXR with pulm edema and she was dialyzed -per renal #3. CAD status post stenting -  chest pain free. Patient states she was taken off Plavix one year ago. Continue Coreg.  #4. Diabetes mellitus2  -holding Glucotrol -continue sliding-scale coverage.  #5. Left kidney lesion as per the CAT scan done at [facility] -  2.2 cm. Will  MRI to further eval.   Code Status: full Family Communication: directly with pt at bedside Disposition Plan: home when medically ready   Consultants:  CCS  Renal  Procedures:  none  Antibiotics:  cipro & flagyl - started on 10/30  HPI/Subjective: States breathing much better today, decreased abd pain. No n/v, no diarrhea  Objective: Filed Vitals:   07/28/12 2111 07/29/12 0512 07/29/12 0847 07/29/12 0936  BP: 147/70 148/56 137/62   Pulse: 103 96 98   Temp: 98.5 F (36.9 C) 98.3 F (36.8 C) 98.2 F (36.8 C)   TempSrc: Oral Oral Oral   Resp: 18 17 18    Height:      Weight: 77.5 kg (170 lb 13.7 oz)     SpO2: 99% 98% 98% 98%    Intake/Output Summary (Last 24 hours) at 07/29/12 1133 Last data filed at 07/29/12 0810  Gross per 24 hour  Intake      0 ml  Output   2250 ml  Net  -2250 ml   Filed Weights   07/27/12 2303 07/28/12 1624 07/28/12 2111  Weight: 80.377 kg (177 lb 3.2 oz) 77.7 kg (171 lb 4.8 oz) 77.5 kg (170 lb 13.7 oz)    Exam:   General:  A&O x 3, in NAD  Cardiovascular:  RRR, nl S1S2  Respiratory: CTA, no crackles or wheezes  Abdomen: soft +BS, decreased lower abd tenderness, ND  Data Reviewed: Basic Metabolic Panel:  Lab 07/29/12 8119 07/28/12 0136  NA 139 135  K 3.8 4.8  CL 97 91*  CO2 28 30  GLUCOSE 82 110*  BUN 21 57*  CREATININE 3.54* 6.26*  CALCIUM 9.0 9.1  MG -- --  PHOS 6.4* --   Liver Function Tests:  Lab 07/29/12 0450 07/28/12 0136  AST -- 24  ALT -- 12  ALKPHOS -- 80  BILITOT -- 0.2*  PROT -- 6.7  ALBUMIN 2.7* 2.7*   No results found for this basename: LIPASE:5,AMYLASE:5 in the last 168 hours No results found for this basename: AMMONIA:5 in the last 168 hours CBC:  Lab 07/29/12 0450 07/28/12 0136  WBC 9.1 11.4*  NEUTROABS -- 9.5*  HGB 11.3* 11.1*  HCT 40.5 39.1  MCV 109.5* 107.4*  PLT 225 237   Cardiac Enzymes: No results found for this basename: CKTOTAL:5,CKMB:5,CKMBINDEX:5,TROPONINI:5 in the last 168 hours BNP (last 3 results) No results found for this basename: PROBNP:3 in the last 8760 hours CBG:  Lab 07/29/12 0734 07/29/12 0431 07/29/12 0110 07/28/12 2035 07/28/12 1648  GLUCAP 81 80 90 99 90    Recent Results (from the past 240 hour(s))  URINE  CULTURE     Status: Normal   Collection Time   07/28/12  5:21 AM      Component Value Range Status Comment   Specimen Description URINE, RANDOM   Final    Special Requests NONE   Final    Culture  Setup Time 07/28/2012 08:40   Final    Colony Count >=100,000 COLONIES/ML   Final    Culture     Final    Value: Multiple bacterial morphotypes present, none predominant. Suggest appropriate recollection if clinically indicated.   Report Status 07/29/2012 FINAL   Final      Studies: Dg Chest Port 1 View  07/28/2012  *RADIOLOGY REPORT*  Clinical Data: Shortness of breath  PORTABLE CHEST - 1 VIEW  Comparison: 04/13/2012  Findings: Cardiomediastinal silhouette is stable.  There is bilateral mild interstitial prominence which may be due to mild edema or pneumonitis.   Hazy left basilar atelectasis or early infiltrate.  There is left IJ dual lumen catheter with tip in right atrium.  IMPRESSION: Bilateral mild interstitial prominence may be due to mild edema or pneumonitis.  Hazy left basilar atelectasis or early infiltrate.   Original Report Authenticated By: Natasha Mead, M.D.     Scheduled Meds:   . carvedilol  25 mg Oral Daily  . ciprofloxacin  400 mg Intravenous Q24H  . darbepoetin (ARANESP) injection - DIALYSIS  100 mcg Intravenous Once  . ferric gluconate (FERRLECIT/NULECIT) IV  125 mg Intravenous Q T,Th,Sa-HD  . fluticasone  1 puff Inhalation BID  . furosemide  80 mg Oral BID  . hydrALAZINE  10 mg Intravenous Once  . insulin aspart  0-5 Units Subcutaneous QHS  . insulin aspart  0-9 Units Subcutaneous TID WC  . metronidazole  500 mg Intravenous Q8H  . multivitamin  1 tablet Oral QHS  . rOPINIRole  1 mg Oral TID  . sodium chloride  3 mL Intravenous Q12H  . DISCONTD: darbepoetin (ARANESP) injection - DIALYSIS  200 mcg Intravenous Once  . DISCONTD: darbepoetin (ARANESP) injection - DIALYSIS  200 mcg Intravenous Q Thu-HD   Continuous Infusions:   Principal Problem:  *Diverticulitis of sigmoid colon Active Problems:  End stage renal disease  DM2 (diabetes mellitus, type 2)  Anemia  Pulmonary edema    Time spent:    [facility]  [facility] Pager [phone]. If 8PM-8AM, please contact night-coverage at [email or web address], password [facility] 07/29/2012, 11:33 AM  LOS: 2 days

## 2012-07-29 NOTE — Progress Notes (Signed)
[facility] Progress Note  Subjective:  No problems with HD yesterday. Slept well last night.  Wants food.  Feels better overall.  Objective Filed Vitals:   07/28/12 2111 07/29/12 0512 07/29/12 0847 07/29/12 0936  BP: 147/70 148/56 137/62   Pulse: 103 96 98   Temp: 98.5 F (36.9 C) 98.3 F (36.8 C) 98.2 F (36.8 C)   TempSrc: Oral Oral Oral   Resp: 18 17 18    Height:      Weight: 77.5 kg (170 lb 13.7 oz)     SpO2: 99% 98% 98% 98%   Physical Exam General: NAD Heart: RRR ~100 Lungs: no wheezes or rales Abdomen: soft with minimal tenderness. Extremities: no LE edema Dialysis Access:  Dialysis Access: left I-J active; right upper AVF maturing   Dialysis Orders: Center: [facility] Optiflux 160.  EDW 79.5 HD Bath 2K 2.25 Ca Time 4 Heparin tight. Access right upper AVF and left I-J 400/A 1.5 Zemplar none mcg IV/HD Epogen 28K - changed to 21K 10/28 Units IV/HD Venofer 50/week   Assessment/Plan:  1. Sigmoid diverticulitis with abscess - IV cipro and flagyl, bowel rest;  hx c diff - stool for c diff PCR ordered, but no BM since admission.  Improving with conservative treatment. IR perc not needed at this point. 2. ESRD - TTS off schedule; likely to be here a while. HD today; last intervention with AVF by Dr. Myra Gianotti 10/9 which included PTA of A-V stenosis and embolization of large branch that was contributing to nonmaturation. Using catheter for now. Next HD Friday. 3. Hypertension/volume - UF 2.2 yesterday with a post wt of 77.5, below edw.  Gently lower EDW. 4. Anemia - Hgb stable in the 11s; has been on max Epo; Fe deficiency 14% Ferritin 464; needs less ESA and more Fe; will replete 125 x 5; reduce Aranesp to 100 5. Metabolic bone disease - iPTH 175 no zemplar; last oupt P 6.1 - resume binders when taking pos 6. Nutrition - NPO for now 7. DM - per primary - SSI 8. ASCVD - on BB  Sheffield Slider, PA-C [facility] Beeper [phone]  07/29/2012,10:54  AM  LOS: 2 days    Additional Objective Labs: Basic Metabolic Panel:  Lab 07/29/12 2952 07/28/12 0136  NA 139 135  K 3.8 4.8  CL 97 91*  CO2 28 30  GLUCOSE 82 110*  BUN 21 57*  CREATININE 3.54* 6.26*  CALCIUM 9.0 9.1  ALB -- --  PHOS 6.4* --   Liver Function Tests:  Lab 07/29/12 0450 07/28/12 0136  AST -- 24  ALT -- 12  ALKPHOS -- 80  BILITOT -- 0.2*  PROT -- 6.7  ALBUMIN 2.7* 2.7*   CBC:  Lab 07/29/12 0450 07/28/12 0136  WBC 9.1 11.4*  NEUTROABS -- 9.5*  HGB 11.3* 11.1*  HCT 40.5 39.1  MCV 109.5* 107.4*  PLT 225 237   Blood Culture    Component Value Date/Time   SDES URINE, RANDOM 07/28/2012 0521   SPECREQUEST NONE 07/28/2012 0521   CULT Multiple bacterial morphotypes present, none predominant. Suggest appropriate recollection if clinically indicated. 07/28/2012 0521   REPTSTATUS 07/29/2012 FINAL 07/28/2012 0521    Cardiac Enzymes: No results found for this basename: CKTOTAL:5,CKMB:5,CKMBINDEX:5,TROPONINI:5 in the last 168 hours CBG:  Lab 07/29/12 0734 07/29/12 0431 07/29/12 0110 07/28/12 2035 07/28/12 1648  GLUCAP 81 80 90 99 90  Studies/Results: Dg Chest Port 1 View  07/28/2012  *RADIOLOGY REPORT*  Clinical Data: Shortness of breath  PORTABLE CHEST - 1 VIEW  Comparison: 04/13/2012  Findings: Cardiomediastinal silhouette is stable.  There is bilateral mild interstitial prominence which may be due to mild edema or pneumonitis.  Hazy left basilar atelectasis or early infiltrate.  There is left IJ dual lumen catheter with tip in right atrium.  IMPRESSION: Bilateral mild interstitial prominence may be due to mild edema or pneumonitis.  Hazy left basilar atelectasis or early infiltrate.   Original Report Authenticated By: Natasha Mead, M.D.    Medications:      . carvedilol  25 mg Oral Daily  . ciprofloxacin  400 mg Intravenous Q24H  . darbepoetin (ARANESP) injection - DIALYSIS  100 mcg Intravenous Once  . ferric gluconate (FERRLECIT/NULECIT) IV  125 mg  Intravenous Q T,Th,Sa-HD  . fluticasone  1 puff Inhalation BID  . furosemide  80 mg Oral BID  . hydrALAZINE  10 mg Intravenous Once  . insulin aspart  0-5 Units Subcutaneous QHS  . insulin aspart  0-9 Units Subcutaneous TID WC  . metronidazole  500 mg Intravenous Q8H  . rOPINIRole  1 mg Oral TID  . sodium chloride  3 mL Intravenous Q12H  . DISCONTD: darbepoetin (ARANESP) injection - DIALYSIS  200 mcg Intravenous Once  . DISCONTD: darbepoetin (ARANESP) injection - DIALYSIS  200 mcg Intravenous Q Thu-HD

## 2012-07-29 NOTE — Progress Notes (Signed)
Patient ID: Aura Dials, female   DOB: Apr 01, 1941, 71 y.o.   MRN: [id number]    Subjective: Abd pain mostly resolved now, denies n/v, feels good and is hungry  Objective: Vital signs in last 24 hours: Temp:  [97.7 F (36.5 C)-98.5 F (36.9 C)] 98.3 F (36.8 C) (10/31 0512) Pulse Rate:  [84-103] 96  (10/31 0512) Resp:  [15-18] 17  (10/31 0512) BP: (91-173)/(31-82) 148/56 mmHg (10/31 0512) SpO2:  [93 %-99 %] 98 % (10/31 0512) Weight:  [170 lb 13.7 oz (77.5 kg)-171 lb 4.8 oz (77.7 kg)] 170 lb 13.7 oz (77.5 kg) (10/30 2111) Last BM Date: 07/27/12  Intake/Output from previous day: 10/30 0701 - 10/31 0700 In: 0  Out: 2350 [Urine:100] Intake/Output this shift:    PE: Abd: soft, minimal tenderness, +BS, no masses  Lab Results:   [facility] 07/29/12 0450 07/28/12 0136  WBC 9.1 11.4*  HGB 11.3* 11.1*  HCT 40.5 39.1  PLT 225 237   BMET  Basename 07/29/12 0450 07/28/12 0136  NA 139 135  K 3.8 4.8  CL 97 91*  CO2 28 30  GLUCOSE 82 110*  BUN 21 57*  CREATININE 3.54* 6.26*  CALCIUM 9.0 9.1   PT/INR No results found for this basename: LABPROT:2,INR:2 in the last 72 hours CMP     Component Value Date/Time   NA 139 07/29/2012 0450   K 3.8 07/29/2012 0450   CL 97 07/29/2012 0450   CO2 28 07/29/2012 0450   GLUCOSE 82 07/29/2012 0450   BUN 21 07/29/2012 0450   CREATININE 3.54* 07/29/2012 0450   CALCIUM 9.0 07/29/2012 0450   PROT 6.7 07/28/2012 0136   ALBUMIN 2.7* 07/29/2012 0450   AST 24 07/28/2012 0136   ALT 12 07/28/2012 0136   ALKPHOS 80 07/28/2012 0136   BILITOT 0.2* 07/28/2012 0136   GFRNONAA 12* 07/29/2012 0450   GFRAA 14* 07/29/2012 0450   Lipase  No results found for this basename: lipase       Studies/Results: Dg Chest Port 1 View  07/28/2012  *RADIOLOGY REPORT*  Clinical Data: Shortness of breath  PORTABLE CHEST - 1 VIEW  Comparison: 04/13/2012  Findings: Cardiomediastinal silhouette is stable.  There is bilateral mild interstitial prominence which  may be due to mild edema or pneumonitis.  Hazy left basilar atelectasis or early infiltrate.  There is left IJ dual lumen catheter with tip in right atrium.  IMPRESSION: Bilateral mild interstitial prominence may be due to mild edema or pneumonitis.  Hazy left basilar atelectasis or early infiltrate.   Original Report Authenticated By: Natasha Mead, M.D.     Anti-infectives: Anti-infectives     Start     Dose/Rate Route Frequency Ordered Stop   07/28/12 0130   ciprofloxacin (CIPRO) IVPB 400 mg        400 mg 200 mL/hr over 60 Minutes Intravenous Every 24 hours 07/28/12 0035     07/28/12 0100   metroNIDAZOLE (FLAGYL) IVPB 500 mg        500 mg 100 mL/hr over 60 Minutes Intravenous Every 8 hours 07/28/12 0033             Assessment/Plan 1. Sigmoid Diverticulitis with abscess:  IR says abscess too small to drain  --keep NPO x ice chips, likely start clears tomorrow.  --IV abx  --up ad lib  2. ESRD: per nephrology  LOS: 2 days    Dorleen Kissel 07/29/2012

## 2012-07-29 NOTE — Progress Notes (Signed)
I have seen and examined this patient and agree with plan as outlined by Audree Bane, PA-C.  Pt reports that she feels better today. Tylie Golonka A,MD 07/29/2012 12:20 PM

## 2012-07-29 NOTE — Progress Notes (Signed)
Agree with PA-White's PN -con' tNPO -maybe PO in AM -con't abx

## 2012-07-30 ENCOUNTER — Inpatient Hospital Stay (HOSPITAL_COMMUNITY): Payer: Medicare Other

## 2012-07-30 LAB — GLUCOSE, CAPILLARY
Glucose-Capillary: 80 mg/dL (ref 70–99)
Glucose-Capillary: 105 mg/dL — ABNORMAL HIGH (ref 70–99)
Glucose-Capillary: 117 mg/dL — ABNORMAL HIGH (ref 70–99)
Glucose-Capillary: 93 mg/dL (ref 70–99)

## 2012-07-30 LAB — CBC
HCT: 39 % (ref 36.0–46.0)
Hemoglobin: 11.1 g/dL — ABNORMAL LOW (ref 12.0–15.0)
MCH: 30.5 pg (ref 26.0–34.0)
MCHC: 28.5 g/dL — ABNORMAL LOW (ref 30.0–36.0)
MCV: 107.1 fL — ABNORMAL HIGH (ref 78.0–100.0)
Platelets: 222 10*3/uL (ref 150–400)
RBC: 3.64 MIL/uL — ABNORMAL LOW (ref 3.87–5.11)
RDW: 17.9 % — ABNORMAL HIGH (ref 11.5–15.5)
WBC: 8.4 10*3/uL (ref 4.0–10.5)

## 2012-07-30 LAB — RENAL FUNCTION PANEL
Albumin: 2.7 g/dL — ABNORMAL LOW (ref 3.5–5.2)
BUN: 35 mg/dL — ABNORMAL HIGH (ref 6–23)
CO2: 27 mEq/L (ref 19–32)
Calcium: 8.8 mg/dL (ref 8.4–10.5)
Chloride: 94 mEq/L — ABNORMAL LOW (ref 96–112)
Creatinine, Ser: 4.78 mg/dL — ABNORMAL HIGH (ref 0.50–1.10)
GFR calc Af Amer: 10 mL/min — ABNORMAL LOW (ref >90)
GFR calc non Af Amer: 8 mL/min — ABNORMAL LOW (ref >90)
Glucose, Bld: 192 mg/dL — ABNORMAL HIGH (ref 70–99)
Phosphorus: 6.2 mg/dL — ABNORMAL HIGH (ref 2.3–4.6)
Potassium: 3.5 mEq/L (ref 3.5–5.1)
Sodium: 135 mEq/L (ref 135–145)

## 2012-07-30 MED ORDER — CIPROFLOXACIN HCL 500 MG PO TABS
500.0000 mg | ORAL_TABLET | ORAL | Status: DC
  Administered 2012-07-30 – 2012-08-03 (×5): 500 mg via ORAL
  Filled 2012-07-30 (×6): qty 1

## 2012-07-30 MED ORDER — DEXTROSE 50 % IV SOLN
INTRAVENOUS | Status: AC
  Administered 2012-07-30: 25 mL
  Filled 2012-07-30: qty 50

## 2012-07-30 MED ORDER — HEPARIN SODIUM (PORCINE) 1000 UNIT/ML DIALYSIS
20.0000 [IU]/kg | INTRAMUSCULAR | Status: DC | PRN
  Filled 2012-07-30: qty 2

## 2012-07-30 MED ORDER — DEXTROSE 50 % IV SOLN
25.0000 mL | Freq: Once | INTRAVENOUS | Status: AC | PRN
  Administered 2012-07-30: 25 mL via INTRAVENOUS

## 2012-07-30 MED ORDER — METRONIDAZOLE 500 MG PO TABS
500.0000 mg | ORAL_TABLET | Freq: Two times a day (BID) | ORAL | Status: DC
  Administered 2012-07-30 – 2012-08-01 (×4): 500 mg via ORAL
  Filled 2012-07-30 (×6): qty 1

## 2012-07-30 MED ORDER — CALCIUM ACETATE 667 MG PO CAPS
1334.0000 mg | ORAL_CAPSULE | Freq: Three times a day (TID) | ORAL | Status: DC
  Administered 2012-07-30 – 2012-08-04 (×14): 1334 mg via ORAL
  Filled 2012-07-30 (×22): qty 2

## 2012-07-30 MED ORDER — CARVEDILOL 25 MG PO TABS
25.0000 mg | ORAL_TABLET | Freq: Two times a day (BID) | ORAL | Status: DC
  Filled 2012-07-30 (×2): qty 1

## 2012-07-30 MED ORDER — MENTHOL 3 MG MT LOZG
1.0000 | LOZENGE | OROMUCOSAL | Status: DC | PRN
  Administered 2012-07-30: 3 mg via ORAL
  Filled 2012-07-30 (×2): qty 9

## 2012-07-30 MED ORDER — CARVEDILOL 25 MG PO TABS
25.0000 mg | ORAL_TABLET | Freq: Two times a day (BID) | ORAL | Status: DC
  Administered 2012-08-01 – 2012-08-05 (×7): 25 mg via ORAL
  Filled 2012-07-30 (×15): qty 1

## 2012-07-30 NOTE — Progress Notes (Signed)
ANTIBIOTIC CONSULT NOTE - FOLLOW UP  Pharmacy Consult for Ciprofloxacin Indication: Sigmoid diverticulitis with absces  Allergies  Allergen Reactions  . Morphine And Related Nausea And Vomiting    Patient Measurements: Height: 5\' 4"  (162.6 cm) Weight: 174 lb 12.8 oz (79.289 kg) IBW/kg (Calculated) : 54.7   Vital Signs: Temp: 97.9 F (36.6 C) (11/01 0540) Temp src: Oral (11/01 0540) BP: 150/57 mmHg (11/01 0540) Pulse Rate: 95  (11/01 0540) Intake/Output from previous day: 10/31 0701 - 11/01 0700 In: 1710 [P.O.:400; I.V.:100; IV Piggyback:1210] Out: -  Intake/Output from this shift:    Labs:  [facility] 07/29/12 0450 07/28/12 0136  WBC 9.1 11.4*  HGB 11.3* 11.1*  PLT 225 237  LABCREA -- --  CREATININE 3.54* 6.26*   Estimated Creatinine Clearance: 15.1 ml/min (by C-G formula based on Cr of 3.54). No results found for this basename: VANCOTROUGH:2,VANCOPEAK:2,VANCORANDOM:2,GENTTROUGH:2,GENTPEAK:2,GENTRANDOM:2,TOBRATROUGH:2,TOBRAPEAK:2,TOBRARND:2,AMIKACINPEAK:2,AMIKACINTROU:2,AMIKACIN:2, in the last 72 hours   Microbiology: Recent Results (from the past 720 hour(s))  URINE CULTURE     Status: Normal   Collection Time   07/28/12  5:21 AM      Component Value Range Status Comment   Specimen Description URINE, RANDOM   Final    Special Requests NONE   Final    Culture  Setup Time 07/28/2012 08:40   Final    Colony Count >=100,000 COLONIES/ML   Final    Culture     Final    Value: Multiple bacterial morphotypes present, none predominant. Suggest appropriate recollection if clinically indicated.   Report Status 07/29/2012 FINAL   Final     Anti-infectives     Start     Dose/Rate Route Frequency Ordered Stop   07/28/12 0130   ciprofloxacin (CIPRO) IVPB 400 mg        400 mg 200 mL/hr over 60 Minutes Intravenous Every 24 hours 07/28/12 0035     07/28/12 0100   metroNIDAZOLE (FLAGYL) IVPB 500 mg        500 mg 100 mL/hr over 60 Minutes Intravenous Every 8 hours 07/28/12  0033            Assessment: 71 y.o. F on Cipro per Rx + Flagyl per MD total abx D#3 for sigmoid diverticulitis with abscess. Per IR, the abscess it too small to drain. CDiff PCR pending, UCx show multiple colonies -- none predominant. It is noted that the patient's diet has progressed to clears. Afebrile, WBC WNL  Consider changing antibiotics to oral whenever patient is tolerating diet progression. Doses remain appropriate for ESRD at this time.  Goal of Therapy:  Proper antibiotics for infection/cultures adjusted for renal/hepatic function   Plan:  1. Continue Cipro 400 mg IV every 24 hours 2. Consider changing to oral as patient tolerates diet progression 3. Will continue to follow HD schedule/duration, culture results, LOT, and antibiotic de-escalation plans   Georgina Pillion, PharmD, BCPS Clinical Pharmacist Pager: [phone] 07/30/2012 8:29 AM

## 2012-07-30 NOTE — Progress Notes (Addendum)
[facility] PROGRESS NOTE  [name] EXB:[id number] DOB: 11/18/1940 DOA: 07/27/2012 PCP: [name], MD  Assessment/Plan: #1. 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 ESRD on hemodialysis - patient missed her dialysis 10/29 due to the pain. Patient usually gets dialyzed on Tuesday Thursday and Saturdays. -she was SOB on 10/30, CXR with pulm edema and she was dialyzed -per renal #3. CAD status post stenting -  chest pain free. Patient states she was taken off Plavix one year ago. Continue Coreg.  #4. Diabetes mellitus2  -holding Glucotrol -continue sliding-scale coverage.  #5. Left kidney lesion as per the CAT scan done at [facility] -  2.2 cm. MRI with 2cm cyst - benign. Pt states she was aware of this lesion and had been followed in the past at [facility].   Code Status: full Family Communication: directly with pt at bedside Disposition Plan: home when medically ready   Consultants:  CCS  Renal  Procedures:  none  Antibiotics:  cipro & flagyl - started on 10/30  HPI/Subjective: States no abd pain. No n/v, no diarrhea. Denies sob. Seen while at dialysis today.  Objective: Filed Vitals:   07/30/12 1201 07/30/12 1229 07/30/12 1243 07/30/12 1734  BP: 112/46 101/54 126/61 106/59  Pulse: 83 86 87 96  Temp:   98.2 F (36.8 C) 97.6 F (36.4 C)  TempSrc:   Oral Oral  Resp:   18 18  Height:      Weight:      SpO2:   100% 95%    Intake/Output Summary (Last 24 hours) at 07/30/12 1924 Last data filed at 07/30/12 1300  Gross per 24 hour  Intake   1830 ml  Output   2000 ml  Net   -170 ml   Filed Weights   07/28/12 2111 07/29/12 2122 07/30/12 0830  Weight: 77.5 kg (170 lb 13.7 oz)  79.289 kg (174 lb 12.8 oz) 78.6 kg (173 lb 4.5 oz)    Exam:   General:  A&O x 3, in NAD  Cardiovascular: RRR, nl S1S2  Respiratory: CTA, no crackles or wheezes  Abdomen: soft +BS, NT/ND  Data Reviewed: Basic Metabolic Panel:  Lab 07/30/12 1027 07/29/12 0450 07/28/12 0136  NA 135 139 135  K 3.5 3.8 4.8  CL 94* 97 91*  CO2 27 28 30   GLUCOSE 192* 82 110*  BUN 35* 21 57*  CREATININE 4.78* 3.54* 6.26*  CALCIUM 8.8 9.0 9.1  MG -- -- --  PHOS 6.2* 6.4* --   Liver Function Tests:  Lab 07/30/12 0828 07/29/12 0450 07/28/12 0136  AST -- -- 24  ALT -- -- 12  ALKPHOS -- -- 80  BILITOT -- -- 0.2*  PROT -- -- 6.7  ALBUMIN 2.7* 2.7* 2.7*   No results found for this basename: LIPASE:5,AMYLASE:5 in the last 168 hours No results found for this basename: AMMONIA:5 in the last 168 hours CBC:  Lab 07/30/12 0828 07/29/12 0450 07/28/12 0136  WBC 8.4 9.1 11.4*  NEUTROABS -- -- 9.5*  HGB 11.1* 11.3* 11.1*  HCT 39.0 40.5 39.1  MCV 107.1* 109.5* 107.4*  PLT 222 225 237   Cardiac Enzymes: No results found for this basename: CKTOTAL:5,CKMB:5,CKMBINDEX:5,TROPONINI:5 in the last 168 hours BNP (last 3 results) No results  found for this basename: PROBNP:3 in the last 8760 hours CBG:  Lab 2012/08/29 1634 08-29-12 0741 August 29, 2012 0435 2012/08/29 0150 08-29-12 0115  GLUCAP 117* 105* 80 165* 69*    Recent Results (from the past 240 hour(s))  URINE CULTURE     Status: Normal   Collection Time   07/28/12  5:21 AM      Component Value Range Status Comment   Specimen Description URINE, RANDOM   Final    Special Requests NONE   Final    Culture  Setup Time 07/28/2012 08:40   Final    Colony Count >=100,000 COLONIES/ML   Final    Culture     Final    Value: Multiple bacterial morphotypes present, none predominant. Suggest appropriate recollection if clinically indicated.   Report Status 07/29/2012 FINAL   Final   CLOSTRIDIUM DIFFICILE BY PCR     Status: Abnormal   Collection Time   08-29-12   1:43 PM      Component Value Range Status Comment   C difficile by pcr POSITIVE (*) NEGATIVE Final      Studies: Mr Abdomen Wo Contrast  08-29-12  *RADIOLOGY REPORT*  Clinical Data: Status post right nephrectomy for renal cell carcinoma, follow up left renal lesion  MRI ABDOMEN WITHOUT CONTRAST  Technique:  Multiplanar multisequence MR imaging of the abdomen was performed. No intravenous contrast was administered.  Comparison: CT abdomen pelvis dated 07/27/2012  Findings: 1.8 x 2.0 x 1.9 cm mildly irregular cyst in the lateral left upper kidney (series 9/image 16), corresponding to the CT abnormality, benign (Bosniak II).  4.5 x 4.2 x 3.7 cm exophytic cyst with layering hemorrhage in the left lower kidney, benign (Bosniak II).  Additional smaller simple cysts in the left kidney. No hydronephrosis.  Status post right nephrectomy.  No abnormal soft tissue in the surgical bed.  Liver is within normal limits.  No hepatic steatosis.  Spleen, pancreas, and adrenal glands are within normal limits.  Gallbladder is unremarkable.  No intrahepatic or extrahepatic ductal dilatation.  No abdominal ascites.  No suspicious abdominal lymphadenopathy.  Suspected hemangioma in the left L4 vertebral body (series 7/image 13).  IMPRESSION: 2.0 cm cyst in the lateral left upper kidney, corresponding to the CT abnormality, benign (Bosniak II).  Additional left renal cysts, including a 4.5 cm cyst with layering hemorrhage, benign.  Status post right nephrectomy.   Original Report Authenticated By: Charline Bills, M.D.     Scheduled Meds:    . calcium acetate  1,334 mg Oral TID WC  . carvedilol  25 mg Oral BID WC  . ciprofloxacin  500 mg Oral Q24H  . dextrose      . ferric gluconate (FERRLECIT/NULECIT) IV  125 mg Intravenous Q T,Th,Sa-HD  . fluticasone  1 puff Inhalation BID  . furosemide  80 mg Oral BID  . hydrALAZINE  10 mg Intravenous Once  . insulin aspart  0-5 Units Subcutaneous QHS  . insulin aspart  0-9  Units Subcutaneous TID WC  . metroNIDAZOLE  500 mg Oral Q12H  . multivitamin  1 tablet Oral QHS  . rOPINIRole  1 mg Oral TID  . sodium chloride  3 mL Intravenous Q12H  . DISCONTD: carvedilol  25 mg Oral Daily  . DISCONTD: carvedilol  25 mg Oral BID  . DISCONTD: ciprofloxacin  400 mg Intravenous Q24H  . DISCONTD: metronidazole  500 mg Intravenous Q8H   Continuous Infusions:   Principal Problem:  *Diverticulitis of sigmoid colon Active Problems:  End stage renal disease  DM2 (diabetes mellitus, type 2)  Anemia  Pulmonary edema    Time spent:    [name]  [facility] Pager [phone]. If 8PM-8AM, please contact night-coverage at [email or web address], password [facility] 07/30/2012, 7:24 PM  LOS: 3 days

## 2012-07-30 NOTE — Progress Notes (Signed)
Patient ID: [name], female   DOB: 1941-08-03, 71 y.o.   MRN: [id number]    Subjective: Abd pain resolved, denies n/v, feels good and is hungry  Objective: Vital signs in last 24 hours: Temp:  [97.9 F (36.6 C)-98.5 F (36.9 C)] 97.9 F (36.6 C) (11/01 0540) Pulse Rate:  [90-96] 95  (11/01 0540) Resp:  [17-18] 17  (11/01 0540) BP: (147-165)/(57-75) 150/57 mmHg (11/01 0540) SpO2:  [98 %-100 %] 100 % (11/01 0540) Weight:  [174 lb 12.8 oz (79.289 kg)] 174 lb 12.8 oz (79.289 kg) (10/31 2122) Last BM Date: 07/27/12  Intake/Output from previous day: 10/31 0701 - 11/01 0700 In: 1710 [P.O.:400; I.V.:100; IV Piggyback:1210] Out: -  Intake/Output this shift:    PE: Abd: soft, no tenderness, +BS, no masses  Lab Results:   Basename 07/29/12 0450 07/28/12 0136  WBC 9.1 11.4*  HGB 11.3* 11.1*  HCT 40.5 39.1  PLT 225 237   BMET  Basename 07/29/12 0450 07/28/12 0136  NA 139 135  K 3.8 4.8  CL 97 91*  CO2 28 30  GLUCOSE 82 110*  BUN 21 57*  CREATININE 3.54* 6.26*  CALCIUM 9.0 9.1   PT/INR No results found for this basename: LABPROT:2,INR:2 in the last 72 hours CMP     Component Value Date/Time   NA 139 07/29/2012 0450   K 3.8 07/29/2012 0450   CL 97 07/29/2012 0450   CO2 28 07/29/2012 0450   GLUCOSE 82 07/29/2012 0450   BUN 21 07/29/2012 0450   CREATININE 3.54* 07/29/2012 0450   CALCIUM 9.0 07/29/2012 0450   PROT 6.7 07/28/2012 0136   ALBUMIN 2.7* 07/29/2012 0450   AST 24 07/28/2012 0136   ALT 12 07/28/2012 0136   ALKPHOS 80 07/28/2012 0136   BILITOT 0.2* 07/28/2012 0136   GFRNONAA 12* 07/29/2012 0450   GFRAA 14* 07/29/2012 0450   Lipase  No results found for this basename: lipase       Studies/Results: Dg Chest Port 1 View  07/28/2012  *RADIOLOGY REPORT*  Clinical Data: Shortness of breath  PORTABLE CHEST - 1 VIEW  Comparison: 04/13/2012  Findings: Cardiomediastinal silhouette is stable.  There is bilateral mild interstitial prominence which may be  due to mild edema or pneumonitis.  Hazy left basilar atelectasis or early infiltrate.  There is left IJ dual lumen catheter with tip in right atrium.  IMPRESSION: Bilateral mild interstitial prominence may be due to mild edema or pneumonitis.  Hazy left basilar atelectasis or early infiltrate.   Original Report Authenticated By: Natasha Mead, M.D.     Anti-infectives: Anti-infectives     Start     Dose/Rate Route Frequency Ordered Stop   07/28/12 0130   ciprofloxacin (CIPRO) IVPB 400 mg        400 mg 200 mL/hr over 60 Minutes Intravenous Every 24 hours 07/28/12 0035     07/28/12 0100   metroNIDAZOLE (FLAGYL) IVPB 500 mg        500 mg 100 mL/hr over 60 Minutes Intravenous Every 8 hours 07/28/12 0033             Assessment/Plan 1. Sigmoid Diverticulitis with abscess:  Improving with conservative management  --full liquids today  --Convert to PO abx today  --poss ready for discharge home tomorrow pending diet and tolerance to PO abx  --up ad lib  2. ESRD: per nephrology  LOS: 3 days    [name] 07/30/2012

## 2012-07-30 NOTE — Progress Notes (Signed)
Hypoglycemic Event  CBG: 69  Treatment: D50 IV 25 mL  Symptoms: None  Follow-up CBG: Time: 01:51 CBG Result:165  Possible Reasons for Event: Inadequate meal intake  Comments/MD notified: yes    [name]  Remember to initiate Hypoglycemia Order Set & complete

## 2012-07-30 NOTE — Progress Notes (Signed)
Agree with PA-White's PN Adv diet slowly since abd pain better con't abx

## 2012-07-30 NOTE — Progress Notes (Signed)
   CARE MANAGEMENT NOTE 07/30/2012  Patient:  Decelles,Kambree A   Account Number:  [id number]  Date Initiated:  07/28/2012  Documentation initiated by:  Darlyne Russian  Subjective/Objective Assessment:   Patient admitted with abdominal pain, diverticulitis. ESRD/ HD on TTS.     Action/Plan:   Progression of care and discharge planning   Anticipated DC Date:     Anticipated DC Plan:  HOME/SELF CARE         Choice offered to / List presented to:             Status of service:  In process, will continue to follow Medicare Important Message given?   (If response is "NO", the following Medicare IM given date fields will be blank) Date Medicare IM given:   Date Additional Medicare IM given:    Discharge Disposition:    Per UR Regulation:  Reviewed for med. necessity/level of care/duration of stay  If discussed at Long Length of Stay Meetings, dates discussed:    Comments:  07/30/2012  [address] RN, [place]   [phone]  PCP:  Dr Shary Decamp Met with patient to discuss discharge planning. She lives alone, independent , her daughter comes daily and transports her to MD visits. She uses O2 at home around the clock, provided by [employer]. She had been in the hospital in February, dc'd to [facility], back to hospital and back to [facility] then had home care with [facility]. CM to continue to follow for discharge planning needs.

## 2012-07-30 NOTE — Progress Notes (Signed)
I have seen and examined this patient and agree with plan as outlined above.  Off schedule so will get back on tomorrow.  Patient was seen on dialysis and the procedure was supervised. BFR 400 Via AVF BP is 114/61.  Patient appears to be tolerating treatment well  Alexes Lamarque A,MD 07/30/2012 9:47 AM

## 2012-07-30 NOTE — Progress Notes (Signed)
[facility] Progress Note  Subjective:  Feels good. Tolerated CL. No BM yet. Up waking in her room yesterday.  Objective Filed Vitals:   07/29/12 1808 07/29/12 1948 07/29/12 2122 07/30/12 0540  BP: 165/75  147/68 150/57  Pulse: 96 90 96 95  Temp: 98 F (36.7 C)  98.5 F (36.9 C) 97.9 F (36.6 C)  TempSrc: Oral  Oral Oral  Resp: 18 18 17 17   Height:      Weight:   79.289 kg (174 lb 12.8 oz)   SpO2: 98% 98% 99% 100%   Physical Exam goal 2.5 General: comfortable on HD  Heart: RRR Lungs: no wheezes or rales Abdomen:soft NT Extremities:no LE edema Dialysis Access: right upper AVF patent; left I-J Qb 400  Dialysis Orders: Center: [facility] Optiflux 160.  EDW 79.5 HD Bath 2K 2.25 Ca Time 4 Heparin tight. Access right upper AVF and left I-J 400/A 1.5 Zemplar none mcg IV/HD Epogen 28K - changed to 21K 10/28 Units IV/HD Venofer 50/week   Assessment/Plan:  1. Sigmoid diverticulitis with abscess - improving on IV cipro and flagyl,  hx c diff - stool for c diff PCR ordered, but no BM since admission. Improving with conservative treatment. IR perc not needed at this point; planning anticipate d/c summary. Can we d/c  enteric precautions. MR of abdomen pending 2. ESRD - TTS off schedule; Plan HD again Saturday am for 3 hours to get back on schedule.; last intervention with AVF by Dr. Myra Gianotti 10/9 which included PTA of A-V stenosis and embolization of large branch that was contributing to nonmaturation. Using catheter for now. Labs pending 3. Hypertension/volume - titrating EDW down; continue coreg should be on BID dose 4. Anemia - Hgb stable in the 11s; has been on max Epo; Fe deficiency 14% Ferritin 464; needs less ESA and more Fe; will replete 125 x 5; reduce Aranesp to 100 - CBC pending 5. Metabolic bone disease - iPTH 175 no zemplar; last oupt P 6.1 - resume binders on FL diet - 2 phoslos ac 6. Nutrition - advancing to [facility] today 7. DM - per primary - SSI 8. ASCVD - on  BB  Sheffield Slider, PA-C [facility] Beeper [phone]  07/30/2012,8:53 AM  LOS: 3 days    Additional Objective Labs: Basic Metabolic Panel:  Lab 07/29/12 1478 07/28/12 0136  NA 139 135  K 3.8 4.8  CL 97 91*  CO2 28 30  GLUCOSE 82 110*  BUN 21 57*  CREATININE 3.54* 6.26*  CALCIUM 9.0 9.1  ALB -- --  PHOS 6.4* --   Liver Function Tests:  Lab 07/29/12 0450 07/28/12 0136  AST -- 24  ALT -- 12  ALKPHOS -- 80  BILITOT -- 0.2*  PROT -- 6.7  ALBUMIN 2.7* 2.7*   CBC:  Lab 07/29/12 0450 07/28/12 0136  WBC 9.1 11.4*  NEUTROABS -- 9.5*  HGB 11.3* 11.1*  HCT 40.5 39.1  MCV 109.5* 107.4*  PLT 225 237   Blood Culture    Component Value Date/Time   SDES URINE, RANDOM 07/28/2012 0521   SPECREQUEST NONE 07/28/2012 0521   CULT Multiple bacterial morphotypes present, none predominant. Suggest appropriate recollection if clinically indicated. 07/28/2012 0521   REPTSTATUS 07/29/2012 FINAL 07/28/2012 0521  CBG:  Lab 07/30/12 0741 07/30/12 0435 07/30/12 0150 07/30/12 0115 07/29/12 2019  GLUCAP 105* 80 165* 69* 139*  Medications:      . carvedilol  25 mg Oral Daily  . ciprofloxacin  400 mg  Intravenous Q24H  . dextrose      . ferric gluconate (FERRLECIT/NULECIT) IV  125 mg Intravenous Q T,Th,Sa-HD  . fluticasone  1 puff Inhalation BID  . furosemide  80 mg Oral BID  . hydrALAZINE  10 mg Intravenous Once  . insulin aspart  0-5 Units Subcutaneous QHS  . insulin aspart  0-9 Units Subcutaneous TID WC  . metronidazole  500 mg Intravenous Q8H  . multivitamin  1 tablet Oral QHS  . rOPINIRole  1 mg Oral TID  . sodium chloride  3 mL Intravenous Q12H

## 2012-07-31 ENCOUNTER — Inpatient Hospital Stay (HOSPITAL_COMMUNITY): Payer: Medicare Other

## 2012-07-31 DIAGNOSIS — A0472 Enterocolitis due to Clostridium difficile, not specified as recurrent: Secondary | ICD-10-CM | POA: Diagnosis present

## 2012-07-31 LAB — CBC
HCT: 41.7 % (ref 36.0–46.0)
MCHC: 28.8 g/dL — ABNORMAL LOW (ref 30.0–36.0)
MCV: 107.8 fL — ABNORMAL HIGH (ref 78.0–100.0)
RDW: 18.3 % — ABNORMAL HIGH (ref 11.5–15.5)

## 2012-07-31 LAB — BASIC METABOLIC PANEL
BUN: 23 mg/dL (ref 6–23)
Chloride: 98 mEq/L (ref 96–112)
Creatinine, Ser: 3.66 mg/dL — ABNORMAL HIGH (ref 0.50–1.10)
GFR calc Af Amer: 13 mL/min — ABNORMAL LOW (ref >90)

## 2012-07-31 LAB — GLUCOSE, CAPILLARY: Glucose-Capillary: 109 mg/dL — ABNORMAL HIGH (ref 70–99)

## 2012-07-31 MED ORDER — VANCOMYCIN 50 MG/ML ORAL SOLUTION: 125.0000 mg | ORAL | Status: DC

## 2012-07-31 MED ORDER — VANCOMYCIN 50 MG/ML ORAL SOLUTION: 125.0000 mg | Freq: Two times a day (BID) | ORAL | Status: DC

## 2012-07-31 MED ORDER — VANCOMYCIN 50 MG/ML ORAL SOLUTION: 125.0000 mg | Freq: Every day | ORAL | Status: DC

## 2012-07-31 MED ORDER — VANCOMYCIN 50 MG/ML ORAL SOLUTION
125.0000 mg | Freq: Four times a day (QID) | ORAL | Status: DC
  Administered 2012-07-31 – 2012-08-05 (×17): 125 mg via ORAL
  Filled 2012-07-31 (×27): qty 2.5

## 2012-07-31 MED ORDER — VANCOMYCIN 50 MG/ML ORAL SOLUTION
125.0000 mg | ORAL | Status: DC
  Filled 2012-07-31: qty 2.5

## 2012-07-31 MED ORDER — SACCHAROMYCES BOULARDII 250 MG PO CAPS
250.0000 mg | ORAL_CAPSULE | Freq: Two times a day (BID) | ORAL | Status: DC
  Administered 2012-07-31 – 2012-08-04 (×9): 250 mg via ORAL
  Filled 2012-07-31 (×12): qty 1

## 2012-07-31 NOTE — Progress Notes (Signed)
D/C patient from hospital when patient meets criteria:  Tolerating oral intake well Ambulating in walkways Adequate pain control without IV medications Having flatus  Rec bowel regimen w fiber to help recover PO ABx 10 days upon d/c F/U with [facility] soon

## 2012-07-31 NOTE — Progress Notes (Signed)
[facility] PROGRESS NOTE  [name] XBJ:[id number] DOB: [birth year] DOA: 07/27/2012 PCP: [name], MD  Assessment/Plan: #1. 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 difficile that is again positive today will change to oral vancomycin and taper as per discussion with GI(curbside), Flagyl discontinued. -Discussed with surgery on-call will continue Cipro for abscess coverage -tolerated clears and advanced today to full liquid>> advanced to solids today, will follow for tolerance -Had diarrhea last night and this p.m., monitor and if improves we'll plan DC in a.m. #2. ESRD on hemodialysis - patient missed her dialysis 10/29 due to the pain. Patient usually gets dialyzed on Tuesday Thursday and Saturdays. -she was SOB on 10/30, CXR with pulm edema and she was dialyzed -per renal #3. CAD status post stenting -  chest pain free. Patient states she was taken off Plavix one year ago. Continue Coreg.  #4. Diabetes mellitus2  -holding Glucotrol -continue sliding-scale coverage.  #5. Left kidney lesion as per the CAT scan done at [facility] -  2.2 cm. MRI with 2cm cyst - benign. Pt states she was aware of this lesion and had been followed in the past at [facility].   Code Status: full Family Communication: daughtert at bedside Disposition Plan: home when medically ready   Consultants:  [facility]  Renal  Procedures:  none  Antibiotics:  cipro & flagyl - started on 10/30  HPI/Subjective: States no abd pain. Had diarrhea last p.m. and this a.m. Denies sob.  Objective: Filed Vitals:   07/31/12 0949 07/31/12 1004 07/31/12 1006 07/31/12 1034  BP: 73/42 100/50  122/59  Pulse:  85  87  Temp:  97.7 F (36.5 C)  97.8 F (36.6 C)  TempSrc:  Axillary   Oral  Resp:  18  20  Height:      Weight:   79.5 kg (175 lb 4.3 oz)   SpO2:  100%  99%    Intake/Output Summary (Last 24 hours) at 07/31/12 1254 Last data filed at 07/31/12 1058  Gross per 24 hour  Intake    480 ml  Output    235 ml  Net    245 ml   Filed Weights   07/30/12 2021 07/31/12 0645 07/31/12 1006  Weight: 77 kg (169 lb 12.1 oz) 76.6 kg (168 lb 14 oz) 79.5 kg (175 lb 4.3 oz)    Exam:   General:  A&O x 3, in NAD  Cardiovascular: RRR, nl S1S2  Respiratory: CTA, no crackles or wheezes  Abdomen: soft +BS, mild right-sided tenderness, nondistended  Data Reviewed: Basic Metabolic Panel:  Lab 07/31/12 3086 07/30/12 0828 07/29/12 0450 07/28/12 0136  NA 137 135 139 135  K 3.9 3.5 3.8 4.8  CL 98 94* 97 91*  CO2 29 27 28 30   GLUCOSE 101* 192* 82 110*  BUN 23 35* 21 57*  CREATININE 3.66* 4.78* 3.54* 6.26*  CALCIUM 9.0 8.8 9.0 9.1  MG -- -- -- --  PHOS -- 6.2* 6.4* --   Liver Function Tests:  Lab 07/30/12 0828 07/29/12 0450 07/28/12 0136  AST -- -- 24  ALT -- -- 12  ALKPHOS -- -- 80  BILITOT -- -- 0.2*  PROT -- -- 6.7  ALBUMIN 2.7* 2.7* 2.7*   No results found for this basename: LIPASE:5,AMYLASE:5 in the last  168 hours No results found for this basename: AMMONIA:5 in the last 168 hours CBC:  Lab 07/31/12 0750 08/07/12 0828 07/29/12 0450 07/28/12 0136  WBC 9.6 8.4 9.1 11.4*  NEUTROABS -- -- -- 9.5*  HGB 12.0 11.1* 11.3* 11.1*  HCT 41.7 39.0 40.5 39.1  MCV 107.8* 107.1* 109.5* 107.4*  PLT 224 222 225 237   Cardiac Enzymes: No results found for this basename: CKTOTAL:5,CKMB:5,CKMBINDEX:5,TROPONINI:5 in the last 168 hours BNP (last 3 results) No results found for this basename: PROBNP:3 in the last 8760 hours CBG:  Lab 07/31/12 1203 07/31/12 0426 2012/08/07 2139 2012-08-07 2017 07-Aug-2012 1634  GLUCAP 188* 92 93 128* 117*    Recent Results (from the past 240 hour(s))  URINE CULTURE     Status: Normal   Collection Time   07/28/12  5:21 AM       Component Value Range Status Comment   Specimen Description URINE, RANDOM   Final    Special Requests NONE   Final    Culture  Setup Time 07/28/2012 08:40   Final    Colony Count >=100,000 COLONIES/ML   Final    Culture     Final    Value: Multiple bacterial morphotypes present, none predominant. Suggest appropriate recollection if clinically indicated.   Report Status 07/29/2012 FINAL   Final   CLOSTRIDIUM DIFFICILE BY PCR     Status: Abnormal   Collection Time   08/07/2012  1:43 PM      Component Value Range Status Comment   C difficile by pcr POSITIVE (*) NEGATIVE Final      Studies: Mr Abdomen Wo Contrast  August 07, 2012  *RADIOLOGY REPORT*  Clinical Data: Status post right nephrectomy for renal cell carcinoma, follow up left renal lesion  MRI ABDOMEN WITHOUT CONTRAST  Technique:  Multiplanar multisequence MR imaging of the abdomen was performed. No intravenous contrast was administered.  Comparison: CT abdomen pelvis dated 07/27/2012  Findings: 1.8 x 2.0 x 1.9 cm mildly irregular cyst in the lateral left upper kidney (series 9/image 16), corresponding to the CT abnormality, benign (Bosniak II).  4.5 x 4.2 x 3.7 cm exophytic cyst with layering hemorrhage in the left lower kidney, benign (Bosniak II).  Additional smaller simple cysts in the left kidney. No hydronephrosis.  Status post right nephrectomy.  No abnormal soft tissue in the surgical bed.  Liver is within normal limits.  No hepatic steatosis.  Spleen, pancreas, and adrenal glands are within normal limits.  Gallbladder is unremarkable.  No intrahepatic or extrahepatic ductal dilatation.  No abdominal ascites.  No suspicious abdominal lymphadenopathy.  Suspected hemangioma in the left L4 vertebral body (series 7/image 13).  IMPRESSION: 2.0 cm cyst in the lateral left upper kidney, corresponding to the CT abnormality, benign (Bosniak II).  Additional left renal cysts, including a 4.5 cm cyst with layering hemorrhage, benign.  Status post right  nephrectomy.   Original Report Authenticated By: Charline Bills, M.D.     Scheduled Meds:    . calcium acetate  1,334 mg Oral TID WC  . carvedilol  25 mg Oral BID WC  . ciprofloxacin  500 mg Oral Q24H  . ferric gluconate (FERRLECIT/NULECIT) IV  125 mg Intravenous Q T,Th,Sa-HD  . fluticasone  1 puff Inhalation BID  . hydrALAZINE  10 mg Intravenous Once  . insulin aspart  0-5 Units Subcutaneous QHS  . insulin aspart  0-9 Units Subcutaneous TID WC  . metroNIDAZOLE  500 mg Oral Q12H  . multivitamin  1 tablet Oral QHS  .  rOPINIRole  1 mg Oral TID  . saccharomyces boulardii  250 mg Oral BID  . sodium chloride  3 mL Intravenous Q12H  . DISCONTD: furosemide  80 mg Oral BID   Continuous Infusions:   Principal Problem:  *Diverticulitis of sigmoid colon Active Problems:  End stage renal disease  DM2 (diabetes mellitus, type 2)  Anemia  Pulmonary edema    Time spent:    [name]  [facility] Pager [phone]. If 8PM-8AM, please contact night-coverage at [email or web address], password [facility] 07/31/2012, 12:54 PM  LOS: 4 days

## 2012-07-31 NOTE — Progress Notes (Signed)
  ANTIBIOTIC CONSULT NOTE - FOLLOW UP  Pharmacy Consult for Ciprofloxacin Indication: Sigmoid diverticulitis with absces  Assessment: 71 y.o. F on Cipro per Rx + Flagyl per MD total abx D#4 for sigmoid diverticulitis with abscess. Per IR, the abscess it too small to drain. CDiff PCR positive- po Vancomycin taper initiated as this is recurrence, UCx show multiple colonies -- none predominant.  Afebrile, WBC WNL  Doses remain appropriate for ESRD at this time.  Please note that Cipro can worsen Cdiff and to be very strict with number of days of therapy to prevent prolonged therapy and further complications. Only other oral option would be Augmentin but similar risk involved concerning Cdiff. It will be vital to make sure stop dates are in place to prevent further complications in this patient.   Goal of Therapy:  Proper antibiotics for infection/cultures adjusted for renal/hepatic function   Plan:  1. Continue Cipro 500mg  po daily- adjusted for renal function. Please assign specific stop dates to prevent prolongation of therapy in setting of positive Cdiff recurrence.   Link Snuffer, PharmD, BCPS Clinical Pharmacist [phone] 07/31/2012 2:25 PM

## 2012-07-31 NOTE — Progress Notes (Signed)
Patient ID: [name], female   DOB: 01-04-1941, 71 y.o.   MRN: [id number]    Subjective: Abd pain resolved, denies n/v, feels good and is hungry, wants more to eat, +BM  Objective: Vital signs in last 24 hours: Temp:  [97.1 F (36.2 C)-98.2 F (36.8 C)] 97.2 F (36.2 C) (11/02 0645) Pulse Rate:  [75-100] 100  (11/02 0730) Resp:  [14-18] 16  (11/02 0730) BP: (88-129)/(28-69) 96/28 mmHg (11/02 0738) SpO2:  [94 %-100 %] 94 % (11/02 0645) Weight:  [168 lb 14 oz (76.6 kg)-173 lb 4.5 oz (78.6 kg)] 168 lb 14 oz (76.6 kg) (11/02 0645) Last BM Date: 07/30/12  Intake/Output from previous day: 11/01 0701 - 11/02 0700 In: 240 [P.O.:240] Out: 2075 [Urine:75] Intake/Output this shift:    PE: Abd: soft, no tenderness, +BS, no masses  Lab Results:   [name] 07/31/12 0750 07/30/12 0828  WBC 9.6 8.4  HGB 12.0 11.1*  HCT 41.7 39.0  PLT 224 222   BMET  Basename 07/30/12 0828 07/29/12 0450  NA 135 139  K 3.5 3.8  CL 94* 97  CO2 27 28  GLUCOSE 192* 82  BUN 35* 21  CREATININE 4.78* 3.54*  CALCIUM 8.8 9.0   PT/INR No results found for this basename: LABPROT:2,INR:2 in the last 72 hours CMP     Component Value Date/Time   NA 135 07/30/2012 0828   K 3.5 07/30/2012 0828   CL 94* 07/30/2012 0828   CO2 27 07/30/2012 0828   GLUCOSE 192* 07/30/2012 0828   BUN 35* 07/30/2012 0828   CREATININE 4.78* 07/30/2012 0828   CALCIUM 8.8 07/30/2012 0828   PROT 6.7 07/28/2012 0136   ALBUMIN 2.7* 07/30/2012 0828   AST 24 07/28/2012 0136   ALT 12 07/28/2012 0136   ALKPHOS 80 07/28/2012 0136   BILITOT 0.2* 07/28/2012 0136   GFRNONAA 8* 07/30/2012 0828   GFRAA 10* 07/30/2012 0828   Lipase  No results found for this basename: lipase       Studies/Results: Mr Abdomen Wo Contrast  07/30/2012  *RADIOLOGY REPORT*  Clinical Data: Status post right nephrectomy for renal cell carcinoma, follow up left renal lesion  MRI ABDOMEN WITHOUT CONTRAST  Technique:  Multiplanar multisequence MR imaging of the  abdomen was performed. No intravenous contrast was administered.  Comparison: CT abdomen pelvis dated 07/27/2012  Findings: 1.8 x 2.0 x 1.9 cm mildly irregular cyst in the lateral left upper kidney (series 9/image 16), corresponding to the CT abnormality, benign (Bosniak II).  4.5 x 4.2 x 3.7 cm exophytic cyst with layering hemorrhage in the left lower kidney, benign (Bosniak II).  Additional smaller simple cysts in the left kidney. No hydronephrosis.  Status post right nephrectomy.  No abnormal soft tissue in the surgical bed.  Liver is within normal limits.  No hepatic steatosis.  Spleen, pancreas, and adrenal glands are within normal limits.  Gallbladder is unremarkable.  No intrahepatic or extrahepatic ductal dilatation.  No abdominal ascites.  No suspicious abdominal lymphadenopathy.  Suspected hemangioma in the left L4 vertebral body (series 7/image 13).  IMPRESSION: 2.0 cm cyst in the lateral left upper kidney, corresponding to the CT abnormality, benign (Bosniak II).  Additional left renal cysts, including a 4.5 cm cyst with layering hemorrhage, benign.  Status post right nephrectomy.   Original Report Authenticated By: [name], M.D.     Anti-infectives: Anti-infectives     Start     Dose/Rate Route Frequency Ordered Stop   07/30/12 2000  ciprofloxacin (CIPRO) tablet 500 mg        500 mg Oral Every 24 hours 07/30/12 0853     07/30/12 1000   metroNIDAZOLE (FLAGYL) tablet 500 mg        500 mg Oral Every 12 hours 07/30/12 0853     07/28/12 0130   ciprofloxacin (CIPRO) IVPB 400 mg  Status:  Discontinued        400 mg 200 mL/hr over 60 Minutes Intravenous Every 24 hours 07/28/12 0035 07/30/12 0853   07/28/12 0100   metroNIDAZOLE (FLAGYL) IVPB 500 mg  Status:  Discontinued        500 mg 100 mL/hr over 60 Minutes Intravenous Every 8 hours 07/28/12 0033 07/30/12 0853           Assessment/Plan 1. Sigmoid Diverticulitis with abscess:  Improving with conservative management, pain  resolved  --soft diet then as tolerated  --cipro/flagyl PO  --poss ready for discharge home today pending diet tolerance  --up ad lib  --needs 2 weeks of cipro/flagyl as outpat and follow up with Dr. Derrell Lolling in 3-4 weeks   2. ESRD: per nephrology  LOS: 4 days    [name] 07/31/2012

## 2012-07-31 NOTE — Discharge Instructions (Signed)
Diverticulitis Small pockets or "bubbles" can develop in the wall of the intestine. Diverticulitis is when those pockets become infected and inflamed. This causes stomach pain (usually on the left side). HOME CARE  Take all medicine as told by your doctor.  Try a clear liquid diet (broth, tea, or water) for as long as told by your doctor.  Keep all follow-up visits with your doctor.  You may be put on a low-fiber diet once you start feeling better. Here are foods that have low-fiber:  White breads, cereals, rice, and pasta.  Cooked fruits and vegetables or soft fresh fruits and vegetables without the skin.  Ground or well-cooked tender beef, ham, veal, lamb, pork, or poultry.  Eggs and seafood.  After you are doing well on the low-fiber diet, you may be put on a high-fiber diet. Here are ways to increase your fiber:  Choose whole-grain breads, cereals, pasta, and brown rice.  Choose fruits and vegetables with skin on. Do not overcook the vegetables.  Choose nuts, seeds, legumes, dried peas, beans, and lentils.  Look for food products that have more than 3 grams of fiber per serving on the food label. GET HELP RIGHT AWAY IF:  Your pain does not get better or gets worse.  You have trouble eating food.  You are not pooping (having bowel movements) like normal.  You have a temperature by mouth above 102 F (38.9 C), not controlled by medicine.  You keep throwing up (vomiting).  You have bloody or black, tarry poop (stools).  You are getting worse and not better. MAKE SURE YOU:   Understand these instructions.  Will watch your condition.  Will get help right away if you are not doing well or get worse. Document Released: 03/03/2008 Document Revised: 12/08/2011 Document Reviewed: 08/06/2009 [facility] Patient Information 2013 [place], [place].  GETTING TO GOOD BOWEL HEALTH. Irregular bowel habits such as constipation and diarrhea can lead to many problems over time.   Having one soft bowel movement a day is the most important way to prevent further problems.  The anorectal canal is designed to handle stretching and feces to safely manage our ability to get rid of solid waste (feces, poop, stool) out of our body.  BUT, hard constipated stools can act like ripping concrete bricks and diarrhea can be a burning fire to this very sensitive area of our body, causing inflamed hemorrhoids, anal fissures, increasing risk is perirectal abscesses, abdominal pain/bloating, an making irritable bowel worse.     The goal: ONE SOFT BOWEL MOVEMENT A DAY!  To have soft, regular bowel movements:    Drink at least 8 tall glasses of water a day.     Take plenty of fiber.  Fiber is the undigested part of plant food that passes into the colon, acting s natures broom to encourage bowel motility and movement.  Fiber can absorb and hold large amounts of water. This results in a larger, bulkier stool, which is soft and easier to pass. Work gradually over several weeks up to 6 servings a day of fiber (25g a day even more if needed) in the form of: o Vegetables -- Root (potatoes, carrots, turnips), leafy green (lettuce, salad greens, celery, spinach), or cooked high residue (cabbage, broccoli, etc) o Fruit -- Fresh (unpeeled skin & pulp), Dried (prunes, apricots, cherries, etc ),  or stewed ( applesauce)  o Whole grain breads, pasta, etc (whole wheat)  o Bran cereals    Bulking Agents -- This type of water-retaining  fiber generally is easily obtained each day by one of the following:  o Psyllium bran -- The psyllium plant is remarkable because its ground seeds can retain so much water. This product is available as Metamucil, Konsyl, Effersyllium, Per Diem Fiber, or the less expensive generic preparation in drug and health food stores. Although labeled a laxative, it really is not a laxative.  o Methylcellulose -- This is another fiber derived from wood which also retains water. It is available as  Citrucel. o Polyethylene Glycol - and artificial fiber commonly called Miralax or Glycolax.  It is helpful for people with gassy or bloated feelings with regular fiber o Flax Seed - a less gassy fiber than psyllium   No reading or other relaxing activity while on the toilet. If bowel movements take longer than 5 minutes, you are too constipated   AVOID CONSTIPATION.  High fiber and water intake usually takes care of this.  Sometimes a laxative is needed to stimulate more frequent bowel movements, but    Laxatives are not a good long-term solution as it can wear the colon out. o Osmotics (Milk of Magnesia, Fleets phosphosoda, Magnesium citrate, MiraLax, GoLytely) are safer than  o Stimulants (Senokot, Castor Oil, Dulcolax, Ex Lax)    o Do not take laxatives for more than 7days in a row.    IF SEVERELY CONSTIPATED, try a Bowel Retraining Program: o Do not use laxatives.  o Eat a diet high in roughage, such as bran cereals and leafy vegetables.  o Drink six (6) ounces of prune or apricot juice each morning.  o Eat two (2) large servings of stewed fruit each day.  o Take one (1) heaping tablespoon of a psyllium-based bulking agent twice a day. Use sugar-free sweetener when possible to avoid excessive calories.  o Eat a normal breakfast.  o Set aside 15 minutes after breakfast to sit on the toilet, but do not strain to have a bowel movement.  o If you do not have a bowel movement by the third day, use an enema and repeat the above steps.    Controlling diarrhea o Switch to liquids and simpler foods for a few days to avoid stressing your intestines further. o Avoid dairy products (especially milk & ice cream) for a short time.  The intestines often can lose the ability to digest lactose when stressed. o Avoid foods that cause gassiness or bloating.  Typical foods include beans and other legumes, cabbage, broccoli, and dairy foods.  Every person has some sensitivity to other foods, so listen to our  body and avoid those foods that trigger problems for you. o Adding fiber (Citrucel, Metamucil, psyllium, Miralax) gradually can help thicken stools by absorbing excess fluid and retrain the intestines to act more normally.  Slowly increase the dose over a few weeks.  Too much fiber too soon can backfire and cause cramping & bloating. o Probiotics (such as active yogurt, Align, etc) may help repopulate the intestines and colon with normal bacteria and calm down a sensitive digestive tract.  Most studies show it to be of mild help, though, and such products can be costly. o Medicines:   Bismuth subsalicylate (ex. Kayopectate, Pepto Bismol) every 30 minutes for up to 6 doses can help control diarrhea.  Avoid if pregnant.   Loperamide (Immodium) can slow down diarrhea.  Start with two tablets (4mg  total) first and then try one tablet every 6 hours.  Avoid if you are having fevers or severe pain.  If you are not better or start feeling worse, stop all medicines and call your doctor for advice o Call your doctor if you are getting worse or not better.  Sometimes further testing (cultures, endoscopy, X-ray studies, bloodwork, etc) may be needed to help diagnose and treat the cause of the diarrhea. o

## 2012-07-31 NOTE — Progress Notes (Signed)
[facility] Progress Note  Subjective:  Starting solids today. Finally had loose BM.  Still C Diff + Belly is touchy  Objective Filed Vitals:   07/31/12 0700 07/31/12 0705 07/31/12 0730 07/31/12 0738  BP: 114/58 89/47 88/30  96/28  Pulse: 100 100 100   Temp:      TempSrc:      Resp: 16 15 16    Height:      Weight:      SpO2:       Physical Exam  General: NAD on HD Heart:tachy reg Lungs:no wheezes or rales Abdomen:soft min.tenderness - right and left side. Extremities: no LE edema Dialysis Access: right upper AVF patent; left I-J   Dialysis Orders: Center: [facility] Opt[facility]lux [facility].  EDW 79.5 HD Bath 2K 2.25 Ca Time 4 Heparin tight. Access right upper AVF and left I-J 400/A 1.5 Zemplar none mcg IV/HD Epogen 28K - changed to 21K 10/28 Units IV/HD Venofer 50/week  Assessment/Plan:  1. Sigmoid diverticulitis with ?abscess - improving on po cipro and flagyl, hx c diff - stool for c diff PCR ordered, but no BM since admission. Improving with conservative treatment. IR perc not needed at this point; planning anticipate d/c summary. Can we d/c enteric precautions. 2. ESRD - TTS off schedule; HD today 3 hours to get back on schedule.; last intervention with AVF by Dr. Myra Gianotti 10/9 which included PTA of A-V stenosis and embolization of large branch that was contributing to nonmaturation. Using catheter for now. 4 K bath today 3. Hypertension/volume  3 kg  below prior EDW;   coreg BID - write orders for holding if systolic down.  Keeping even today on HD. Was orthstatic pre HD today, but said she didn't have any dizziness when walking in room yesterday. Net UF 2 liters 11/1 D/c lasix 4. Anemia - Hgb stable in the 11s; has been on max Epo; Fe deficiency 14% Ferritin 464; needs less ESA and more Fe; will replete 125 x 5; reduce Aranesp to 100 - Hgb 12 yesterday; hold ESA  At discharge 5. Metabolic bone disease - iPTH 175 no zemplar; last oupt P 6.1 - resumed binders 11/1 - 2 phoslos  ac 6. Nutrition - advanced to solids today 7. DM - per primary - SSI 8. ASCVD - on BB       9.   Hx renal cell Ca - MR yesterday to follow up abnormal CT findings: "2.0 cm cyst in the lateral left upper kidney, corresponding to the                CT abnormality, benign (Bosniak II).  Additional left renal cysts, including a 4.5 cm cyst with layering hemorrhage, benign.  Status post              right nephrectomy."    Sheffield Slider, PA-C [facility] Beeper [phone]  07/31/2012,8:18 AM  LOS: 4 days    Additional Objective Labs: Basic Metabolic Panel:  Lab 07/30/12 9562 07/29/12 0450 07/28/12 0136  NA 135 139 135  K 3.5 3.8 4.8  CL 94* 97 91*  CO2 27 28 30   GLUCOSE 192* 82 110*  BUN 35* 21 57*  CREATININE 4.78* 3.54* 6.26*  CALCIUM 8.8 9.0 9.1  ALB -- -- --  PHOS 6.2* 6.4* --   Liver Function Tests:  Lab 07/30/12 0828 07/29/12 0450 07/28/12 0136  AST -- -- 24  ALT -- -- 12  ALKPHOS -- -- 80  BILITOT -- -- 0.2*  PROT -- -- 6.7  ALBUMIN 2.7* 2.7* 2.7*  CBC:  Lab 07/31/12 0750 07/30/12 0828 07/29/12 0450 07/28/12 0136  WBC 9.6 8.4 9.1 --  NEUTROABS -- -- -- 9.5*  HGB 12.0 11.1* 11.3* --  HCT 41.7 39.0 40.5 --  MCV 107.8* 107.1* 109.5* 107.4*  PLT 224 222 225 --   Blood Culture    Component Value Date/Time   SDES URINE, RANDOM 07/28/2012 0521   SPECREQUEST NONE 07/28/2012 0521   CULT Multiple bacterial morphotypes present, none predominant. Suggest appropriate recollection if clinically indicated. 07/28/2012 0521   REPTSTATUS 07/29/2012 FINAL 07/28/2012 0521  CBG:  Lab 07/31/12 0426 07/30/12 2139 07/30/12 2017 07/30/12 1634 07/30/12 0741  GLUCAP 92 93 128* 117* 105*  Studies/Results: Mr Abdomen Wo Contrast  07/30/2012  *RADIOLOGY REPORT*  Clinical Data: Status post right nephrectomy for renal cell carcinoma, follow up left renal lesion  MRI ABDOMEN WITHOUT CONTRAST  Technique:  Multiplanar multisequence MR imaging of the abdomen was performed. No  intravenous contrast was administered.  Comparison: CT abdomen pelvis dated 07/27/2012  Findings: 1.8 x 2.0 x 1.9 cm mildly irregular cyst in the lateral left upper kidney (series 9/image 16), corresponding to the CT abnormality, benign (Bosniak II).  4.5 x 4.2 x 3.7 cm exophytic cyst with layering hemorrhage in the left lower kidney, benign (Bosniak II).  Additional smaller simple cysts in the left kidney. No hydronephrosis.  Status post right nephrectomy.  No abnormal soft tissue in the surgical bed.  Liver is within normal limits.  No hepatic steatosis.  Spleen, pancreas, and adrenal glands are within normal limits.  Gallbladder is unremarkable.  No intrahepatic or extrahepatic ductal dilatation.  No abdominal ascites.  No suspicious abdominal lymphadenopathy.  Suspected hemangioma in the left L4 vertebral body (series 7/image 13).  IMPRESSION: 2.0 cm cyst in the lateral left upper kidney, corresponding to the CT abnormality, benign (Bosniak II).  Additional left renal cysts, including a 4.5 cm cyst with layering hemorrhage, benign.  Status post right nephrectomy.   Original Report Authenticated By: Charline Bills, M.D.    Medications:      . calcium acetate  1,334 mg Oral TID WC  . carvedilol  25 mg Oral BID WC  . ciprofloxacin  500 mg Oral Q24H  . ferric gluconate (FERRLECIT/NULECIT) IV  125 mg Intravenous Q T,Th,Sa-HD  . fluticasone  1 puff Inhalation BID  . furosemide  80 mg Oral BID  . hydrALAZINE  10 mg Intravenous Once  . insulin aspart  0-5 Units Subcutaneous QHS  . insulin aspart  0-9 Units Subcutaneous TID WC  . metroNIDAZOLE  500 mg Oral Q12H  . multivitamin  1 tablet Oral QHS  . rOPINIRole  1 mg Oral TID  . sodium chloride  3 mL Intravenous Q12H  . DISCONTD: carvedilol  25 mg Oral Daily  . DISCONTD: carvedilol  25 mg Oral BID  . DISCONTD: ciprofloxacin  400 mg Intravenous Q24H  . DISCONTD: metronidazole  500 mg Intravenous Q8H

## 2012-08-01 DIAGNOSIS — A0472 Enterocolitis due to Clostridium difficile, not specified as recurrent: Secondary | ICD-10-CM

## 2012-08-01 LAB — BASIC METABOLIC PANEL
BUN: 29 mg/dL — ABNORMAL HIGH (ref 6–23)
CO2: 24 mEq/L (ref 19–32)
Chloride: 96 mEq/L (ref 96–112)
GFR calc non Af Amer: 10 mL/min — ABNORMAL LOW (ref >90)
Glucose, Bld: 135 mg/dL — ABNORMAL HIGH (ref 70–99)
Potassium: 3.9 mEq/L (ref 3.5–5.1)
Sodium: 133 mEq/L — ABNORMAL LOW (ref 135–145)

## 2012-08-01 LAB — GLUCOSE, CAPILLARY
Glucose-Capillary: 116 mg/dL — ABNORMAL HIGH (ref 70–99)
Glucose-Capillary: 100 mg/dL — ABNORMAL HIGH (ref 70–99)
Glucose-Capillary: 108 mg/dL — ABNORMAL HIGH (ref 70–99)

## 2012-08-01 MED ORDER — METRONIDAZOLE 500 MG PO TABS
500.0000 mg | ORAL_TABLET | Freq: Three times a day (TID) | ORAL | Status: DC
  Administered 2012-08-01 – 2012-08-04 (×8): 500 mg via ORAL
  Filled 2012-08-01 (×12): qty 1

## 2012-08-01 NOTE — Progress Notes (Signed)
[facility] PROGRESS NOTE  [name] ZOX:[id number] DOB: 1941-01-05 DOA: 07/27/2012 PCP: [name], MD  Assessment/Plan: #1. 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 difficile that is again positive today will change to oral vancomycin and taper as per discussion with GI(curbside). -Discussed with surgery - Dr Janee Morn today and he states pt needs to be on both flagyl and cipro for the diverticulitis with abscess for 14days total  as well coverage oral vanc for her recurrent c.diff. -tolerating solids, follow and plan to d/c when diarrhea improves -#2. ESRD on hemodialysis - patient missed her dialysis 10/29 due to the pain. Patient usually gets dialyzed on Tuesday Thursday and Saturdays. -she was SOB on 10/30, CXR with pulm edema and she was dialyzed -per renal #3. CAD status post stenting -  chest pain free. Patient states she was taken off Plavix one year ago. Continue Coreg.  #4. Diabetes mellitus2  -holding Glucotrol -continue sliding-scale coverage.  #5. Left kidney lesion as per the CAT scan done at [facility] -  2.2 cm. MRI with 2cm cyst - benign. Pt states she was aware of this lesion and had been followed in the past at [facility].   Code Status: full Family Communication: directly with pt at bedside Disposition Plan: home when medically ready   Consultants:  CCS  Renal  Procedures:  none  Antibiotics:  cipro & flagyl - started on 10/30 Oral vanc started on 11/2 HPI/Subjective:  Still with diarrhea x5-6 so far today, some R. Sided abd pain with diarrhea.  Objective: Filed Vitals:   07/31/12 1800 07/31/12 2140 07/31/12 2203 08/01/12 0545  BP: 116/47  108/43 138/53  Pulse: 80  79 98  Temp: 98.2 F (36.8 C)  97.4 F  (36.3 C) 97.3 F (36.3 C)  TempSrc: Oral  Oral Oral  Resp: 18  18 18   Height:      Weight:   80 kg (176 lb 5.9 oz)   SpO2: 95% 97% 97% 98%    Intake/Output Summary (Last 24 hours) at 08/01/12 1329 Last data filed at 08/01/12 1200  Gross per 24 hour  Intake    720 ml  Output      1 ml  Net    719 ml   Filed Weights   07/31/12 0645 07/31/12 1006 07/31/12 2203  Weight: 76.6 kg (168 lb 14 oz) 79.5 kg (175 lb 4.3 oz) 80 kg (176 lb 5.9 oz)    Exam:   General:  A&O x 3, in NAD  Cardiovascular: RRR, nl S1S2  Respiratory: CTA, no crackles or wheezes  Abdomen: soft +BS, mild right-sided tenderness, nondistended  Data Reviewed: Basic Metabolic Panel:  Lab 08/01/12 8119 07/31/12 0750 07/30/12 0828 07/29/12 0450 07/28/12 0136  NA 133* 137 135 139 135  K 3.9 3.9 3.5 3.8 4.8  CL 96 98 94* 97 91*  CO2 24 29 27 28 30   GLUCOSE 135* 101* 192* 82 110*  BUN 29* 23 35* 21 57*  CREATININE 4.29* 3.66* 4.78* 3.54* 6.26*  CALCIUM 9.2 9.0 8.8 9.0 9.1  MG -- -- -- -- --  PHOS -- -- 6.2* 6.4* --   Liver Function Tests:  Lab 07/30/12 0828 07/29/12 0450 07/28/12 0136  AST -- -- 24  ALT -- -- 12  ALKPHOS -- --  80  BILITOT -- -- 0.2*  PROT -- -- 6.7  ALBUMIN 2.7* 2.7* 2.7*   No results found for this basename: LIPASE:5,AMYLASE:5 in the last 168 hours No results found for this basename: AMMONIA:5 in the last 168 hours CBC:  Lab 07/31/12 0750 07/30/12 0828 07/29/12 0450 07/28/12 0136  WBC 9.6 8.4 9.1 11.4*  NEUTROABS -- -- -- 9.5*  HGB 12.0 11.1* 11.3* 11.1*  HCT 41.7 39.0 40.5 39.1  MCV 107.8* 107.1* 109.5* 107.4*  PLT 224 222 225 237   Cardiac Enzymes: No results found for this basename: CKTOTAL:5,CKMB:5,CKMBINDEX:5,TROPONINI:5 in the last 168 hours BNP (last 3 results) No results found for this basename: PROBNP:3 in the last 8760 hours CBG:  Lab 08/01/12 1137 07/31/12 2200 07/31/12 1650 07/31/12 1203 07/31/12 0426  GLUCAP 100* 155* 109* 188* 92    Recent Results (from  the past 240 hour(s))  URINE CULTURE     Status: Normal   Collection Time   07/28/12  5:21 AM      Component Value Range Status Comment   Specimen Description URINE, RANDOM   Final    Special Requests NONE   Final    Culture  Setup Time 07/28/2012 08:40   Final    Colony Count >=100,000 COLONIES/ML   Final    Culture     Final    Value: Multiple bacterial morphotypes present, none predominant. Suggest appropriate recollection if clinically indicated.   Report Status 07/29/2012 FINAL   Final   CLOSTRIDIUM DIFFICILE BY PCR     Status: Abnormal   Collection Time   07/30/12  1:43 PM      Component Value Range Status Comment   C difficile by pcr POSITIVE (*) NEGATIVE Final      Studies: No results found.  Scheduled Meds:    . calcium acetate  1,334 mg Oral TID WC  . carvedilol  25 mg Oral BID WC  . ciprofloxacin  500 mg Oral Q24H  . ferric gluconate (FERRLECIT/NULECIT) IV  125 mg Intravenous Q T,Th,Sa-HD  . fluticasone  1 puff Inhalation BID  . hydrALAZINE  10 mg Intravenous Once  . insulin aspart  0-5 Units Subcutaneous QHS  . insulin aspart  0-9 Units Subcutaneous TID WC  . multivitamin  1 tablet Oral QHS  . rOPINIRole  1 mg Oral TID  . saccharomyces boulardii  250 mg Oral BID  . sodium chloride  3 mL Intravenous Q12H  . vancomycin  125 mg Oral QID   Followed by  . vancomycin  125 mg Oral BID   Followed by  . vancomycin  125 mg Oral Daily   Followed by  . vancomycin  125 mg Oral QODAY   Followed by  . vancomycin  125 mg Oral Q3 days  . [DISCONTINUED] metroNIDAZOLE  500 mg Oral Q12H   Continuous Infusions:   Principal Problem:  *Diverticulitis of sigmoid colon Active Problems:  End stage renal disease  DM2 (diabetes mellitus, type 2)  Anemia  Pulmonary edema  Clostridium difficile diarrhea    Time spent:    [name]  [facility] Pager [phone]. If 8PM-8AM, please contact night-coverage at [email or web address], password [facility] 08/01/2012, 1:29  PM  LOS: 5 days

## 2012-08-01 NOTE — Progress Notes (Signed)
Subjective:  Tolerated some Breakfast this am, Bp dropped yesterday on hd  sbp to 70's with no uf, standing to go to bathroom with assistance this am , Continued Diarrhea Objective Vital signs in last 24 hours: Filed Vitals:   07/31/12 1800 07/31/12 2140 07/31/12 2203 08/01/12 0545  BP: 116/47  108/43 138/53  Pulse: 80  79 98  Temp: 98.2 F (36.8 C)  97.4 F (36.3 C) 97.3 F (36.3 C)  TempSrc: Oral  Oral Oral  Resp: 18  18 18   Height:      Weight:   80 kg (176 lb 5.9 oz)   SpO2: 95% 97% 97% 98%   Weight change: 0.9 kg (1 lb 15.8 oz)  Intake/Output Summary (Last 24 hours) at 08/01/12 0855 Last data filed at 08/01/12 0530  Gross per 24 hour  Intake    960 ml  Output    162 ml  Net    798 ml   Labs: Basic Metabolic Panel:  Lab 08/01/12 1610 07/31/12 0750 07/30/12 0828 07/29/12 0450  NA 133* 137 135 --  K 3.9 3.9 3.5 --  CL 96 98 94* --  CO2 24 29 27  --  GLUCOSE 135* 101* 192* --  BUN 29* 23 35* --  CREATININE 4.29* 3.66* 4.78* --  CALCIUM 9.2 9.0 8.8 --  ALB -- -- -- --  PHOS -- -- 6.2* 6.4*   Liver Function Tests:  Lab 07/30/12 0828 07/29/12 0450 07/28/12 0136  AST -- -- 24  ALT -- -- 12  ALKPHOS -- -- 80  BILITOT -- -- 0.2*  PROT -- -- 6.7  ALBUMIN 2.7* 2.7* 2.7*   No results found for this basename: LIPASE:3,AMYLASE:3 in the last 168 hours No results found for this basename: AMMONIA:3 in the last 168 hours CBC:  Lab 07/31/12 0750 07/30/12 0828 07/29/12 0450 07/28/12 0136  WBC 9.6 8.4 9.1 --  NEUTROABS -- -- -- 9.5*  HGB 12.0 11.1* 11.3* --  HCT 41.7 39.0 40.5 --  MCV 107.8* 107.1* 109.5* 107.4*  PLT 224 222 225 --   Cardiac Enzymes: No results found for this basename: CKTOTAL:5,CKMB:5,CKMBINDEX:5,TROPONINI:5 in the last 168 hours CBG:  Lab 07/31/12 2200 07/31/12 1650 07/31/12 1203 07/31/12 0426 07/30/12 2139  GLUCAP 155* 109* 188* 92 93    Iron Studies: No results found for this basename: IRON,TIBC,TRANSFERRIN,FERRITIN in the last 72  hours Studies/Results: No results found. Medications:      . calcium acetate  1,334 mg Oral TID WC  . carvedilol  25 mg Oral BID WC  . ciprofloxacin  500 mg Oral Q24H  . ferric gluconate (FERRLECIT/NULECIT) IV  125 mg Intravenous Q T,Th,Sa-HD  . fluticasone  1 puff Inhalation BID  . hydrALAZINE  10 mg Intravenous Once  . insulin aspart  0-5 Units Subcutaneous QHS  . insulin aspart  0-9 Units Subcutaneous TID WC  . metroNIDAZOLE  500 mg Oral Q12H  . multivitamin  1 tablet Oral QHS  . rOPINIRole  1 mg Oral TID  . saccharomyces boulardii  250 mg Oral BID  . sodium chloride  3 mL Intravenous Q12H  . vancomycin  125 mg Oral QID   Followed by  . vancomycin  125 mg Oral BID   Followed by  . vancomycin  125 mg Oral Daily   Followed by  . vancomycin  125 mg Oral QODAY   Followed by  . vancomycin  125 mg Oral Q3 days  . [DISCONTINUED] furosemide  80 mg Oral BID  I  have reviewed scheduled and prn medications.  Physical Exam: General:a=Alert, appropriate, NAD Heart: RRR, no rub  Lungs: CTA bilaterally Abdomen: Obese, bs+=,soft, nontender Extremities: Dialysis Access: No pedal edema, Positive bruit  R U A AVF, L ij  Perm cath.   Dialysis Orders: Center: [facility] Optiflux 160.  EDW 79.5 HD Bath 2K 2.25 Ca Time 4 Heparin tight. Access right upper AVF and left I-J 400/A 1.5 Zemplar none mcg IV/HD Epogen 28K - changed to 21K 10/28 Units IV/HD Venofer 50/week   Assessment/Plan:  1. Sigmoid diverticulitis  And C, Difficile Positive -Reports 4 loose stools past 24hrs but overall improving  on po cipro and flagyl 2. ESRD - TTS off schedule; HD yesterday to keep on schedule 3 hours with some  Hypotension resolved  Now.;Need to get  Standing wts not bed  wts pre and post hd/ using perm cath and last intervention with AVF by Dr. Myra Gianotti 10/9 which included PTA of A-V stenosis and embolization of large branch that was contributing to nonmaturation. K=3.9 used 4.0 k bath 3. Hypertension/volume=  On 07/31/12 was 3 kg below prior EDW;On  25mg  Coreg BID - orders for holding if systolic down.Some BP drop in HD as above . This AM said she didn't have any dizziness when walking in room . NEEDS TO GET STANDING WTS. 4. Anemia - Hgb stable 12.0 yesterday . AS out pt.had been on max Epo; Fe deficiency 14% Ferritin 464; needing less ESA and more Fe; will replete 125 x 5;  - Holding Aranesp fu am cbc 5. Metabolic bone disease - iPTH 175 no zemplar; last oupt P 6.1 - resumed binders 11/1 - 2 phoslos ac 6. Nutrition - Diet advanced yesterday 7. DM - per primary - SSI 8. ASCVD - on BB and Tells me O2 at home 9. 9. Hx renal cell Ca - Followed by Urology at [facility]/ In past  MR yesterday to follow up abnormal CT findings: "2.0 cm cyst in the lateral left upper kidney, corresponding to the CT abnormality, benign (Bosniak II). Additional left renal cysts, including a 4.5 cm cyst with layering hemorrhage, benign. Status post right   Lenny Pastel, PA-C [facility] Beeper [phone] 08/01/2012,8:55 AM  LOS: 5 days

## 2012-08-01 NOTE — Progress Notes (Signed)
Ongoing Tx for C diff as well as diverticulitis Patient examined and I agree with the assessment and plan  Violeta Gelinas, MD, MPH, FACS Pager: [phone]  08/01/2012 1:04 PM

## 2012-08-01 NOTE — Progress Notes (Signed)
I have seen and examined this patient and agree with plan as outlined by D. Zeyfang, PA-C. Keino Placencia A,MD 08/01/2012 11:06 AM

## 2012-08-01 NOTE — Progress Notes (Signed)
Patient ID: [name], female   DOB: 08/06/1941, 71 y.o.   MRN: [id number]    Subjective: Abd pain min, denies n/v, 6 very loose stools.  Objective: Vital signs in last 24 hours: Temp:  [97.3 F (36.3 C)-98.2 F (36.8 C)] 97.3 F (36.3 C) (11/03 0545) Pulse Rate:  [74-98] 98  (11/03 0545) Resp:  [18] 18  (11/03 0545) BP: (101-138)/(41-53) 138/53 mmHg (11/03 0545) SpO2:  [95 %-98 %] 98 % (11/03 0545) Weight:  [176 lb 5.9 oz (80 kg)] 176 lb 5.9 oz (80 kg) (11/02 2203) Last BM Date: 08/01/12  Intake/Output from previous day: 11/02 0701 - 11/03 0700 In: 960 [P.O.:960] Out: 162 [Stool:2] Intake/Output this shift:    PE: Abd: soft, no tenderness, +BS, no masses  Lab Results:   [facility] 07/31/12 0750 07/30/12 0828  WBC 9.6 8.4  HGB 12.0 11.1*  HCT 41.7 39.0  PLT 224 222   BMET  Basename 08/01/12 0645 07/31/12 0750  NA 133* 137  K 3.9 3.9  CL 96 98  CO2 24 29  GLUCOSE 135* 101*  BUN 29* 23  CREATININE 4.29* 3.66*  CALCIUM 9.2 9.0   PT/INR No results found for this basename: LABPROT:2,INR:2 in the last 72 hours CMP     Component Value Date/Time   NA 133* 08/01/2012 0645   K 3.9 08/01/2012 0645   CL 96 08/01/2012 0645   CO2 24 08/01/2012 0645   GLUCOSE 135* 08/01/2012 0645   BUN 29* 08/01/2012 0645   CREATININE 4.29* 08/01/2012 0645   CALCIUM 9.2 08/01/2012 0645   PROT 6.7 07/28/2012 0136   ALBUMIN 2.7* 07/30/2012 0828   AST 24 07/28/2012 0136   ALT 12 07/28/2012 0136   ALKPHOS 80 07/28/2012 0136   BILITOT 0.2* 07/28/2012 0136   GFRNONAA 10* 08/01/2012 0645   GFRAA 11* 08/01/2012 0645   Lipase  No results found for this basename: lipase       Studies/Results: No results found.  Anti-infectives: Anti-infectives     Start     Dose/Rate Route Frequency Ordered Stop   08/30/12 1000   vancomycin (VANCOCIN) 50 mg/mL oral solution 125 mg        125 mg Oral Every 3 DAYS 07/31/12 1310 09/14/12 0959   08/22/12 1000   vancomycin (VANCOCIN) 50 mg/mL oral solution  125 mg        125 mg Oral Every other day 07/31/12 1310 08/30/12 0959   08/15/12 1000   vancomycin (VANCOCIN) 50 mg/mL oral solution 125 mg        125 mg Oral Daily 07/31/12 1310 08/22/12 0959   08/07/12 2200   vancomycin (VANCOCIN) 50 mg/mL oral solution 125 mg        125 mg Oral 2 times daily 07/31/12 1310 08/14/12 2159   07/31/12 1400   vancomycin (VANCOCIN) 50 mg/mL oral solution 125 mg        125 mg Oral 4 times daily 07/31/12 1310 08/07/12 1359   07/30/12 2000   ciprofloxacin (CIPRO) tablet 500 mg        500 mg Oral Every 24 hours 07/30/12 0853     07/30/12 1000   metroNIDAZOLE (FLAGYL) tablet 500 mg        500 mg Oral Every 12 hours 07/30/12 0853     07/28/12 0130   ciprofloxacin (CIPRO) IVPB 400 mg  Status:  Discontinued        400 mg 200 mL/hr over 60 Minutes Intravenous Every 24 hours 07/28/12  4098 07/30/12 0853   07/28/12 0100   metroNIDAZOLE (FLAGYL) IVPB 500 mg  Status:  Discontinued        500 mg 100 mL/hr over 60 Minutes Intravenous Every 8 hours 07/28/12 0033 07/30/12 0853           Assessment/Plan 1. Sigmoid Diverticulitis with abscess and with C.Diff:  Tic symptoms Improving with conservative management, pain resolved, still with diarrhea  --regular diet  --cipro/flagyl PO until 11/13 for total of 14 days on abx.  --up ad lib  --follow up with Dr. Derrell Lolling in 3-4 weeks   2. ESRD: per nephrology  LOS: 5 days    Kaeleb Emond 08/01/2012

## 2012-08-02 LAB — CBC
HCT: 42.4 % (ref 36.0–46.0)
Hemoglobin: 12.2 g/dL (ref 12.0–15.0)
MCH: 30.5 pg (ref 26.0–34.0)
RBC: 4 MIL/uL (ref 3.87–5.11)

## 2012-08-02 LAB — RENAL FUNCTION PANEL
CO2: 24 mEq/L (ref 19–32)
Chloride: 97 mEq/L (ref 96–112)
GFR calc Af Amer: 9 mL/min — ABNORMAL LOW (ref >90)
GFR calc non Af Amer: 7 mL/min — ABNORMAL LOW (ref >90)
Glucose, Bld: 92 mg/dL (ref 70–99)
Sodium: 134 mEq/L — ABNORMAL LOW (ref 135–145)

## 2012-08-02 LAB — GLUCOSE, CAPILLARY
Glucose-Capillary: 113 mg/dL — ABNORMAL HIGH (ref 70–99)
Glucose-Capillary: 121 mg/dL — ABNORMAL HIGH (ref 70–99)
Glucose-Capillary: 112 mg/dL — ABNORMAL HIGH (ref 70–99)

## 2012-08-02 MED ORDER — PSYLLIUM 95 % PO PACK
1.0000 | PACK | Freq: Two times a day (BID) | ORAL | Status: DC
  Administered 2012-08-02 – 2012-08-04 (×4): 1 via ORAL
  Filled 2012-08-02 (×7): qty 1

## 2012-08-02 MED ORDER — HEPARIN SODIUM (PORCINE) 1000 UNIT/ML DIALYSIS
20.0000 [IU]/kg | INTRAMUSCULAR | Status: DC | PRN
  Administered 2012-08-03: 1600 [IU] via INTRAVENOUS_CENTRAL
  Filled 2012-08-02: qty 2

## 2012-08-02 NOTE — Progress Notes (Addendum)
Doubt any acute surgical issues Will follow more peripherally Bowel regimen to help correct irreg stools.  CDiff w/u as needed Consider GI consult to consider fecal transplant (Dr. Christella Hartigan w [facility] establishing a protocol, I believe...)

## 2012-08-02 NOTE — Progress Notes (Signed)
Subjective:  No current complaints, but five episodes of loose stools since yesterday; abdominal pain only prior to BM, no nausea or vomiting.  Objective: Vital signs in last 24 hours: Temp:  [97.5 F (36.4 C)-98.2 F (36.8 C)] 98.2 F (36.8 C) (11/04 0523) Pulse Rate:  [91-104] 93  (11/03 2115) Resp:  [18-20] 20  (11/04 0523) BP: (126-159)/(47-72) 134/54 mmHg (11/04 0523) SpO2:  [94 %-100 %] 98 % (11/04 0523) Weight:  [78.3 kg (172 lb 9.9 oz)] 78.3 kg (172 lb 9.9 oz) (11/03 2115) Weight change: -1.2 kg (-2 lb 10.3 oz)  Intake/Output from previous day: 11/03 0701 - 11/04 0700 In: 120 [P.O.:120] Out: 1 [Emesis/NG output:1]   EXAM: General appearance:  Alert, in no apparent distress  Resp:  CTA without rales, rhonchi, or wheezes Cardio:  RRR without murmur or rub GI: + BS, soft and nontender Extremities:  No edema Access:  Left IJ catheter, AVF @ RUA with + bruit  Lab Results:  Basename 08/02/12 0602 07/31/12 0750  WBC 13.9* 9.6  HGB 12.2 12.0  HCT 42.4 41.7  PLT 208 224   BMET:  Basename 08/02/12 0602 08/01/12 0645 07/30/12 0828  NA 134* 133* --  K 4.8 3.9 --  CL 97 96 --  CO2 24 24 --  GLUCOSE 92 135* --  BUN 38* 29* --  CREATININE 5.27* 4.29* --  CALCIUM 9.8 9.2 --  ALBUMIN 2.9* -- 2.7*   No results found for this basename: PTH:2 in the last 72 hours Iron Studies: No results found for this basename: IRON,TIBC,TRANSFERRIN,FERRITIN in the last 72 hours  Dialysis Orders: Center: [facility] Optiflux 160.  EDW 79.5 HD Bath 2K 2.25 Ca Time 4 Heparin tight. Access right upper AVF and left I-J 400/A 1.5 Zemplar none mcg IV/HD Epogen 28K - changed to 21K 10/28 Units IV/HD Venofer 50/week   Assessment/Plan: 1. Sigmoid diverticulitis with recurrent C diff - still with frequent loose stools, on Flagyl 500 mg tid, Cipro 500 mg qd, and now oral Vancomycin.  2. ESRD - HD on TTS @ [facility]; using L IJ catheter, s/p AVF fistulogram on 07/07/12 with PTA of anastomotic stenosis and  and coil embolization of large competing branch per Dr. Myra Gianotti. 3. HTN/Volume - BP currently stable at 134/54, although recent drops during HD; wt 78.3 kg yesterday, EDW 79.5.  Need standing wts.  4. Anemia - Hgb 12.2 today, Fe sat 14% with ferritin 464; began Fe on 11/2, no Aranesp. 5. Metabolic bone disease - Ca 9.8 (10.7 corrected), P 5.1, iPTH 175, no Zemplar, on Phoslo 2 with meals. 6. Nutrition - Alb 2.9, diet advanced on 11/2. 7. DM - on SSI per primary. 8. ASCVD - on BB, O2 at home. 9. Hx renal cell cancer - followed by Urology at [facility]; MRI abdomen showed 2-cm cyst in lateral left kidney (benign), additional left renal cysts; s/p post right nephrectomy.   LOS: 6 days   LYLES,CHARLES 08/02/2012,7:14 AM  Patient seen and examined and agree with assessment and plan as above with additions as indicated.  Vinson Moselle  MD [facility] [phone] pgr    [phone] cell 08/02/2012, 3:38 PM

## 2012-08-02 NOTE — Progress Notes (Signed)
Patient ID: [name], female   DOB: 30-Apr-1941, 71 y.o.   MRN: [id number]    Subjective: Pt feels ok, but still having a lot of BMs.  Diverticular pain is gone.  Objective: Vital signs in last 24 hours: Temp:  [97.5 F (36.4 C)-98.2 F (36.8 C)] 97.6 F (36.4 C) (11/04 0909) Pulse Rate:  [91-104] 101  (11/04 0909) Resp:  [18-20] 19  (11/04 0909) BP: (122-159)/(47-72) 142/60 mmHg (11/04 0909) SpO2:  [94 %-100 %] 100 % (11/04 0909) Weight:  [172 lb 9.9 oz (78.3 kg)] 172 lb 9.9 oz (78.3 kg) (11/03 2115) Last BM Date: 08/01/12  Intake/Output from previous day: 11/03 0701 - 11/04 0700 In: 120 [P.O.:120] Out: 1 [Emesis/NG output:1] Intake/Output this shift: Total I/O In: 120 [P.O.:120] Out: -   PE: Abd: soft, NT, ND, obese, +BS  Lab Results:   [facility] 08/02/12 0602 07/31/12 0750  WBC 13.9* 9.6  HGB 12.2 12.0  HCT 42.4 41.7  PLT 208 224   BMET  Basename 08/02/12 0602 08/01/12 0645  NA 134* 133*  K 4.8 3.9  CL 97 96  CO2 24 24  GLUCOSE 92 135*  BUN 38* 29*  CREATININE 5.27* 4.29*  CALCIUM 9.8 9.2   PT/INR No results found for this basename: LABPROT:2,INR:2 in the last 72 hours CMP     Component Value Date/Time   NA 134* 08/02/2012 0602   K 4.8 08/02/2012 0602   CL 97 08/02/2012 0602   CO2 24 08/02/2012 0602   GLUCOSE 92 08/02/2012 0602   BUN 38* 08/02/2012 0602   CREATININE 5.27* 08/02/2012 0602   CALCIUM 9.8 08/02/2012 0602   PROT 6.7 07/28/2012 0136   ALBUMIN 2.9* 08/02/2012 0602   AST 24 07/28/2012 0136   ALT 12 07/28/2012 0136   ALKPHOS 80 07/28/2012 0136   BILITOT 0.2* 07/28/2012 0136   GFRNONAA 7* 08/02/2012 0602   GFRAA 9* 08/02/2012 0602   Lipase  No results found for this basename: lipase       Studies/Results: No results found.  Anti-infectives: Anti-infectives     Start     Dose/Rate Route Frequency Ordered Stop   08/30/12 1000   vancomycin (VANCOCIN) 50 mg/mL oral solution 125 mg        125 mg Oral Every 3 DAYS 07/31/12 1310 09/14/12  0959   08/22/12 1000   vancomycin (VANCOCIN) 50 mg/mL oral solution 125 mg        125 mg Oral Every other day 07/31/12 1310 08/30/12 0959   08/15/12 1000   vancomycin (VANCOCIN) 50 mg/mL oral solution 125 mg        125 mg Oral Daily 07/31/12 1310 08/22/12 0959   08/07/12 2200   vancomycin (VANCOCIN) 50 mg/mL oral solution 125 mg        125 mg Oral 2 times daily 07/31/12 1310 08/14/12 2159   08/01/12 1400   metroNIDAZOLE (FLAGYL) tablet 500 mg        500 mg Oral 3 times per day 08/01/12 1334 08/11/12 2359   07/31/12 1400   vancomycin (VANCOCIN) 50 mg/mL oral solution 125 mg        125 mg Oral 4 times daily 07/31/12 1310 08/07/12 1359   07/30/12 2000   ciprofloxacin (CIPRO) tablet 500 mg        500 mg Oral Every 24 hours 07/30/12 0853 08/11/12 2359   07/30/12 1000   metroNIDAZOLE (FLAGYL) tablet 500 mg  Status:  Discontinued  500 mg Oral Every 12 hours 07/30/12 0853 08/01/12 1324   07/28/12 0130   ciprofloxacin (CIPRO) IVPB 400 mg  Status:  Discontinued        400 mg 200 mL/hr over 60 Minutes Intravenous Every 24 hours 07/28/12 0035 07/30/12 0853   07/28/12 0100   metroNIDAZOLE (FLAGYL) IVPB 500 mg  Status:  Discontinued        500 mg 100 mL/hr over 60 Minutes Intravenous Every 8 hours 07/28/12 0033 07/30/12 0853           Assessment/Plan  1. Diverticulitis, resolving 2. Recurrent c diff colitis 3. ESRD  Plan: 1. Patient feels much better from her diverticulitis. 2. She now has recurrent c diff colitis and is being treated by the medicine team for this. 3. No surgical intervention warranted at this time.   LOS: 6 days    [name] E 08/02/2012, 9:37 AM Pager: [phone]

## 2012-08-02 NOTE — Progress Notes (Signed)
Utilization review completed.  

## 2012-08-02 NOTE — Progress Notes (Signed)
[facility] PROGRESS NOTE  [name] WUJ:[id number] DOB: [birth year] DOA: 07/27/2012 PCP: Suzanne Rossetti, MD  Assessment/Plan: #1. 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 difficile that is again positive today will change to oral vancomycin and taper as per discussion with GI(curbside). -Discussed with surgery - Dr Janee Morn 11/3 and he states pt needs to be on both flagyl and cipro for the diverticulitis with abscess for 14days total  as well coverage oral vanc for her recurrent c.diff. -tolerating solids, follow and plan to d/c when diarrhea improves -Continue antibiotics as above oral vanc taper, Cipro and Flagyl -#2. ESRD on hemodialysis - patient missed her dialysis 10/29 due to the pain. Patient usually gets dialyzed on Tuesday Thursday and Saturdays. -she was SOB on 10/30, CXR with pulm edema and she was dialyzed -per renal #3. CAD status post stenting -  chest pain free. Patient states she was taken off Plavix one year ago. Continue Coreg.  #4. Diabetes mellitus2  -holding Glucotrol -continue sliding-scale coverage.  #5. Left kidney lesion as per the CAT scan done at [facility] -  2.2 cm. MRI with 2cm cyst - benign. Pt states she was aware of this lesion and had been followed in the past at [facility].   Code Status: full Family Communication: directly with pt at bedside Disposition Plan: home when medically ready   Consultants:  CCS  Renal  Procedures:  none  Antibiotics:  cipro & flagyl - started on 10/30 Oral vanc started on 11/2 HPI/Subjective: Still with diarrhea, denies abdominal pain.  Objective: Filed Vitals:   08/02/12 0523 08/02/12 0856 08/02/12 0909 08/02/12 1345  BP: 134/54 122/61 142/60 132/66  Pulse:  102 101  88  Temp: 98.2 F (36.8 C)  97.6 F (36.4 C) 97.8 F (36.6 C)  TempSrc: Oral     Resp: 20  19 18   Height:      Weight:      SpO2: 98%  100% 100%    Intake/Output Summary (Last 24 hours) at 08/02/12 1549 Last data filed at 08/02/12 1345  Gross per 24 hour  Intake    340 ml  Output      0 ml  Net    340 ml   Filed Weights   07/31/12 1006 07/31/12 2203 08/01/12 2115  Weight: 79.5 kg (175 lb 4.3 oz) 80 kg (176 lb 5.9 oz) 78.3 kg (172 lb 9.9 oz)    Exam:   General:  A&O x 3, in NAD  Cardiovascular: RRR, nl S1S2  Respiratory: CTA, no crackles or wheezes  Abdomen: soft +BS, mild right-sided tenderness to palpation, nondistended  Data Reviewed: Basic Metabolic Panel:  Lab 08/02/12 9562 08/01/12 0645 07/31/12 0750 07/30/12 0828 07/29/12 0450  NA 134* 133* 137 135 139  K 4.8 3.9 3.9 3.5 3.8  CL 97 96 98 94* 97  CO2 24 24 29 27 28   GLUCOSE 92 135* 101* 192* 82  BUN 38* 29* 23 35* 21  CREATININE 5.27* 4.29* 3.66* 4.78* 3.54*  CALCIUM 9.8 9.2 9.0 8.8 9.0  MG -- -- -- -- --  PHOS 5.1* -- -- 6.2* 6.4*   Liver Function Tests:  Lab 08/02/12 0602 07/30/12 0828 07/29/12 0450 07/28/12 0136  AST -- -- -- 24  ALT -- -- -- 12  ALKPHOS -- -- --  80  BILITOT -- -- -- 0.2*  PROT -- -- -- 6.7  ALBUMIN 2.9* 2.7* 2.7* 2.7*   No results found for this basename: LIPASE:5,AMYLASE:5 in the last 168 hours No results found for this basename: AMMONIA:5 in the last 168 hours CBC:  Lab 08/02/12 0602 07/31/12 0750 07/30/12 0828 07/29/12 0450 07/28/12 0136  WBC 13.9* 9.6 8.4 9.1 11.4*  NEUTROABS -- -- -- -- 9.5*  HGB 12.2 12.0 11.1* 11.3* 11.1*  HCT 42.4 41.7 39.0 40.5 39.1  MCV 106.0* 107.8* 107.1* 109.5* 107.4*  PLT 208 224 222 225 237   Cardiac Enzymes: No results found for this basename: CKTOTAL:5,CKMB:5,CKMBINDEX:5,TROPONINI:5 in the last 168 hours BNP (last 3 results) No results found for this basename: PROBNP:3 in the last 8760 hours CBG:  Lab 08/02/12 1144 08/02/12 0722  08/01/12 2120 08/01/12 1635 08/01/12 1137  GLUCAP 113* 94 108* 120* 100*    Recent Results (from the past 240 hour(s))  URINE CULTURE     Status: Normal   Collection Time   07/28/12  5:21 AM      Component Value Range Status Comment   Specimen Description URINE, RANDOM   Final    Special Requests NONE   Final    Culture  Setup Time 07/28/2012 08:40   Final    Colony Count >=100,000 COLONIES/ML   Final    Culture     Final    Value: Multiple bacterial morphotypes present, none predominant. Suggest appropriate recollection if clinically indicated.   Report Status 07/29/2012 FINAL   Final   CLOSTRIDIUM DIFFICILE BY PCR     Status: Abnormal   Collection Time   07/30/12  1:43 PM      Component Value Range Status Comment   C difficile by pcr POSITIVE (*) NEGATIVE Final      Studies: No results found.  Scheduled Meds:    . calcium acetate  1,334 mg Oral TID WC  . carvedilol  25 mg Oral BID WC  . ciprofloxacin  500 mg Oral Q24H  . ferric gluconate (FERRLECIT/NULECIT) IV  125 mg Intravenous Q T,Th,Sa-HD  . fluticasone  1 puff Inhalation BID  . hydrALAZINE  10 mg Intravenous Once  . insulin aspart  0-5 Units Subcutaneous QHS  . insulin aspart  0-9 Units Subcutaneous TID WC  . metroNIDAZOLE  500 mg Oral Q8H  . multivitamin  1 tablet Oral QHS  . rOPINIRole  1 mg Oral TID  . saccharomyces boulardii  250 mg Oral BID  . sodium chloride  3 mL Intravenous Q12H  . vancomycin  125 mg Oral QID   Followed by  . vancomycin  125 mg Oral BID   Followed by  . vancomycin  125 mg Oral Daily   Followed by  . vancomycin  125 mg Oral QODAY   Followed by  . vancomycin  125 mg Oral Q3 days   Continuous Infusions:   Principal Problem:  *Diverticulitis of sigmoid colon Active Problems:  End stage renal disease  DM2 (diabetes mellitus, type 2)  Anemia  Pulmonary edema  Clostridium difficile diarrhea    Time spent:    [name]  [facility] Pager [phone]. If  8PM-8AM, please contact night-coverage at [email or web address], password [facility] 08/02/2012, 3:49 PM  LOS: 6 days

## 2012-08-02 NOTE — Plan of Care (Signed)
Problem: Food- and Nutrition-Related Knowledge Deficit (NB-1.1) Goal: Nutrition education Formal process to instruct or train a patient/client in a skill or to impart knowledge to help patients/clients voluntarily manage or modify food choices and eating behavior to maintain or improve health.  Outcome: Completed/Met Date Met:  08/02/12  Nutrition Education Note  RD consulted for Renal Education. Provided Renal Pyramid to patient. Reviewed food groups and provided written recommended serving sizes specifically determined for patient's current nutritional status.   Explained why diet restrictions are needed and provided lists of foods to limit/avoid that are high potassium, sodium, and phosphorus. Provided specific recommendations on safer alternatives of these foods. Strongly encouraged compliance of this diet.   Discussed importance of protein intake at each meal and snack. Provided examples of how to maximize protein intake throughout the day. Discussed need for fluid restriction with dialysis, importance of minimizing weight gain between HD treatments, and renal-friendly beverage options.  Encouraged pt to discuss specific diet questions/concerns with RD at HD outpatient facility.   Expect poor compliance.  Body mass index is 29.63 kg/(m^2). Pt meets criteria for Overweigh based on current BMI.  Current diet order is Renal, patient is consuming approximately 50-100% of meals at this time. Labs and medications reviewed. No further nutrition interventions warranted at this time. RD contact information provided. If additional nutrition issues arise, please re-consult RD.  Suzanne Motto MS, RD, LDN Pager: [phone] After-hours pager: [phone]

## 2012-08-03 ENCOUNTER — Inpatient Hospital Stay (HOSPITAL_COMMUNITY): Payer: Medicare Other

## 2012-08-03 DIAGNOSIS — K5732 Diverticulitis of large intestine without perforation or abscess without bleeding: Secondary | ICD-10-CM

## 2012-08-03 LAB — RENAL FUNCTION PANEL
Albumin: 2.9 g/dL — ABNORMAL LOW (ref 3.5–5.2)
CO2: 26 mEq/L (ref 19–32)
Calcium: 9.6 mg/dL (ref 8.4–10.5)
GFR calc Af Amer: 7 mL/min — ABNORMAL LOW (ref >90)
GFR calc non Af Amer: 6 mL/min — ABNORMAL LOW (ref >90)
Phosphorus: 6.4 mg/dL — ABNORMAL HIGH (ref 2.3–4.6)
Sodium: 134 mEq/L — ABNORMAL LOW (ref 135–145)

## 2012-08-03 LAB — CBC
MCV: 104.8 fL — ABNORMAL HIGH (ref 78.0–100.0)
Platelets: 215 10*3/uL (ref 150–400)
RBC: 3.74 MIL/uL — ABNORMAL LOW (ref 3.87–5.11)
WBC: 12.8 10*3/uL — ABNORMAL HIGH (ref 4.0–10.5)

## 2012-08-03 LAB — GLUCOSE, CAPILLARY

## 2012-08-03 MED ORDER — BISMUTH SUBSALICYLATE 262 MG/15ML PO SUSP
30.0000 mL | Freq: Two times a day (BID) | ORAL | Status: AC
  Administered 2012-08-03 – 2012-08-04 (×3): 30 mL via ORAL
  Filled 2012-08-03: qty 236

## 2012-08-03 MED ORDER — IOHEXOL 300 MG/ML  SOLN
20.0000 mL | INTRAMUSCULAR | Status: AC
  Administered 2012-08-03 (×2): 20 mL via ORAL

## 2012-08-03 NOTE — Consult Note (Signed)
[facility]  Total days of antibiotics 8        Day 8 cipro        Day 8 metronidazole        Day 3 po vanco       Reason for Consult: recurrent c.difficile infection    Referring Physician: gross  Principal Problem:  *Diverticulitis of sigmoid colon Active Problems:  End stage renal disease  DM2 (diabetes mellitus, type 2)  Anemia  Pulmonary edema  Clostridium difficile diarrhea    HPI: [name] is a 71 y.o. female with history of HTN, DM, afib, CAD, ESRD on hemodialysis, recurrent c.dfficile presented to the ER to [facility] with complaints of worsening abdominal pain that has been persisting for the past 2 weeks but last 2 days it has become severe localizing to left lower quadrant and suprapubic area. Her abdominal pain is associated diarrhea with nausea and vomiting. She denies fever, chill,s nightsweats.  CT abdomen pelvis done showed sigmoid diverticulitis with possible abscess formation and patient has been transferred to [facility] since patient is a dialysis patient. The patient has had numerous hospitalization since the beginning of the year due to recurrence C. difficile that initially occurred in FEbruary. She states that she acquired it at the nursing home for which she was discharged after having been hospitalized for pneumonia. She mentions that her diarrheal event occurred in setting of other nursing home residents having diarrheal illness. She has had repeated course of therapy for c.difficile, one of which being quite expensive? Fidoxamicin. She denies ever having to take a vancomycin taper. She states that she last had treatment was in August 2013 . Her bowel movements have not returned to normal for which she reports having loose stools that occasionally are numerous. She denies having any blood in her stool and is generally not associated with abdominal cramping as severe as what brought her in this hospitalization. She states that  since she started having c.difficile in the beginning of the year she has lost roughly 30 lbs. She has been now back at home rehabilitating. In early oct, had AVG evaluation due to concern for stenosis.   Past Medical History  Diagnosis Date  . Carotid artery occlusion   . ESRD (end stage renal disease)   . C. difficile diarrhea   . Cancer     Renal Cell Carcinoma  . Hypertension   . Diabetes mellitus     Type II  . ASCVD (arteriosclerotic cardiovascular disease)   . Myocardial infarction   . PCI (pneumatosis cystoides intestinalis)   . Atrial fibrillation April  2013  . CHF (congestive heart failure)   . Pulmonary hypertension   . CKD (chronic kidney disease)   . Anemia   . Increased PTH level   . COPD (chronic obstructive pulmonary disease)   . Shortness of breath     Allergies:  Allergies  Allergen Reactions  . Morphine And Related Nausea And Vomiting   MEDICATIONS:    . bismuth subsalicylate  30 mL Oral BID  . calcium acetate  1,334 mg Oral TID WC  . carvedilol  25 mg Oral BID WC  . ciprofloxacin  500 mg Oral Q24H  . ferric gluconate (FERRLECIT/NULECIT) IV  125 mg Intravenous Q T,Th,Sa-HD  . fluticasone  1 puff Inhalation BID  . hydrALAZINE  10 mg Intravenous Once  . insulin aspart  0-5 Units Subcutaneous QHS  . insulin aspart  0-9 Units Subcutaneous  TID WC  . [COMPLETED] iohexol  20 mL Oral Q1 Hr x 2  . metroNIDAZOLE  500 mg Oral Q8H  . multivitamin  1 tablet Oral QHS  . psyllium  1 packet Oral BID  . rOPINIRole  1 mg Oral TID  . saccharomyces boulardii  250 mg Oral BID  . sodium chloride  3 mL Intravenous Q12H  . vancomycin  125 mg Oral QID   Followed by  . vancomycin  125 mg Oral BID   Followed by  . vancomycin  125 mg Oral Daily   Followed by  . vancomycin  125 mg Oral QODAY   Followed by  . vancomycin  125 mg Oral Q3 days    History  Substance Use Topics  . Smoking status: Former Smoker -- 50 years    Types: Cigarettes    Quit date: 09/30/2003   . Smokeless tobacco: Never Used  . Alcohol Use: No    Family History  Problem Relation Age of Onset  . Cancer Mother     pancreatic  . Stroke Father   . Coronary artery disease Father   . Cancer Brother     Brain  . Kidney disease Brother     Kidney stones    Review of Systems  Constitutional: Negative for fever, chills, diaphoresis, activity change, appetite change, fatigue and unexpected weight change.  HENT: Negative for congestion, sore throat, rhinorrhea, sneezing, trouble swallowing and sinus pressure.  Eyes: Negative for photophobia and visual disturbance.  Respiratory: Negative for cough, chest tightness, shortness of breath, wheezing and stridor.  Cardiovascular: Negative for chest pain, palpitations and leg swelling.  Gastrointestinal: per hpi Genitourinary: Negative for dysuria, hematuria, flank pain and difficulty urinating.  Musculoskeletal: Negative for myalgias, back pain, joint swelling, arthralgias and gait problem.  Skin: Negative for color change, pallor, rash and wound.  Neurological: Negative for dizziness, tremors, weakness and light-headedness.  Hematological: Negative for adenopathy. Does not bruise/bleed easily.  Psychiatric/Behavioral: Negative for behavioral problems, confusion, sleep disturbance, dysphoric mood, decreased concentration and agitation.     OBJECTIVE: Temp:  [97.6 F (36.4 C)-98.7 F (37.1 C)] 98.2 F (36.8 C) (11/05 1739) Pulse Rate:  [81-95] 84  (11/05 1739) Resp:  [16-20] 18  (11/05 1739) BP: (132-168)/(52-87) 135/71 mmHg (11/05 1739) SpO2:  [95 %-100 %] 100 % (11/05 1739) Weight:  [170 lb 3.1 oz (77.2 kg)-172 lb 6.4 oz (78.2 kg)] 170 lb 3.1 oz (77.2 kg) (11/05 1051)  Physical Exam  Constitutional: She is oriented to person, place, and time. She appears well-developed and well-nourished. No distress.  HENT:  Head: Normocephalic and atraumatic.  Nose: Nose normal.  Mouth/Throat: Oropharynx is clear and moist. No  oropharyngeal exudate.  Eyes: Conjunctivae normal are normal. Pupils are equal, round, and reactive to light.  no discharge. No scleral icterus.  Neck: Normal range of motion. Neck supple.  Cardiovascular: Normal rate and regular rhythm.  Respiratory: Effort normal and breath sounds normal. No respiratory distress. She has no wheezes. She has no rales.  GI: Soft. Bowel sounds are normal. She exhibits no distension. There is no tenderness. There is no rebound and no guarding.  Musculoskeletal: Normal range of motion. She exhibits no edema and no tenderness.  Ext: right upper extremity AV fistula Skin: Skin is warm and dry. She is not diaphoretic.   LABS: Results for orders placed during the hospital encounter of 07/27/12 (from the past 48 hour(s))  GLUCOSE, CAPILLARY     Status: Abnormal   Collection Time  08/01/12  9:20 PM      Component Value Range Comment   Glucose-Capillary 108 (*) 70 - 99 mg/dL   CBC     Status: Abnormal   Collection Time   08/02/12  6:02 AM      Component Value Range Comment   WBC 13.9 (*) 4.0 - 10.5 K/uL    RBC 4.00  3.87 - 5.11 MIL/uL    Hemoglobin 12.2  12.0 - 15.0 g/dL    HCT 98.1  19.1 - 47.8 %    MCV 106.0 (*) 78.0 - 100.0 fL    MCH 30.5  26.0 - 34.0 pg    MCHC 28.8 (*) 30.0 - 36.0 g/dL    RDW 29.5 (*) 62.1 - 15.5 %    Platelets 208  150 - 400 K/uL   RENAL FUNCTION PANEL     Status: Abnormal   Collection Time   08/02/12  6:02 AM      Component Value Range Comment   Sodium 134 (*) 135 - 145 mEq/L    Potassium 4.8  3.5 - 5.1 mEq/L DELTA CHECK NOTED   Chloride 97  96 - 112 mEq/L    CO2 24  19 - 32 mEq/L    Glucose, Bld 92  70 - 99 mg/dL    BUN 38 (*) 6 - 23 mg/dL    Creatinine, Ser 3.08 (*) 0.50 - 1.10 mg/dL    Calcium 9.8  8.4 - 65.7 mg/dL    Phosphorus 5.1 (*) 2.3 - 4.6 mg/dL    Albumin 2.9 (*) 3.5 - 5.2 g/dL    GFR calc non Af Amer 7 (*) >90 mL/min    GFR calc Af Amer 9 (*) >90 mL/min   GLUCOSE, CAPILLARY     Status: Normal   Collection Time    08/02/12  7:22 AM      Component Value Range Comment   Glucose-Capillary 94  70 - 99 mg/dL   GLUCOSE, CAPILLARY     Status: Abnormal   Collection Time   08/02/12 11:44 AM      Component Value Range Comment   Glucose-Capillary 113 (*) 70 - 99 mg/dL   GLUCOSE, CAPILLARY     Status: Abnormal   Collection Time   08/02/12  4:52 PM      Component Value Range Comment   Glucose-Capillary 121 (*) 70 - 99 mg/dL   GLUCOSE, CAPILLARY     Status: Abnormal   Collection Time   08/02/12  9:42 PM      Component Value Range Comment   Glucose-Capillary 112 (*) 70 - 99 mg/dL   CBC     Status: Abnormal   Collection Time   08/03/12  6:49 AM      Component Value Range Comment   WBC 12.8 (*) 4.0 - 10.5 K/uL    RBC 3.74 (*) 3.87 - 5.11 MIL/uL    Hemoglobin 11.5 (*) 12.0 - 15.0 g/dL    HCT 84.6  96.2 - 95.2 %    MCV 104.8 (*) 78.0 - 100.0 fL    MCH 30.7  26.0 - 34.0 pg    MCHC 29.3 (*) 30.0 - 36.0 g/dL    RDW 84.1 (*) 32.4 - 15.5 %    Platelets 215  150 - 400 K/uL   RENAL FUNCTION PANEL     Status: Abnormal   Collection Time   08/03/12  6:49 AM      Component Value Range Comment   Sodium 134 (*) 135 -  145 mEq/L    Potassium 4.3  3.5 - 5.1 mEq/L    Chloride 96  96 - 112 mEq/L    CO2 26  19 - 32 mEq/L    Glucose, Bld 94  70 - 99 mg/dL    BUN 47 (*) 6 - 23 mg/dL    Creatinine, Ser 4.09 (*) 0.50 - 1.10 mg/dL    Calcium 9.6  8.4 - 81.1 mg/dL    Phosphorus 6.4 (*) 2.3 - 4.6 mg/dL    Albumin 2.9 (*) 3.5 - 5.2 g/dL    GFR calc non Af Amer 6 (*) >90 mL/min    GFR calc Af Amer 7 (*) >90 mL/min   GLUCOSE, CAPILLARY     Status: Abnormal   Collection Time   08/03/12 12:02 PM      Component Value Range Comment   Glucose-Capillary 211 (*) 70 - 99 mg/dL    Comment 1 Notify RN      Comment 2 Documented in Chart       MICRO: 11/1 cdiff positive 10/30 urine cx NGTD  IMAGING: No results found.  Assessment/Plan:  71yo Female with ESRD, recent SNF resident who has had recurrent cdifficile infection since  February 2013. Last course of therapy for c.difficile in August. Now presents with abdominal cramping with diarrhea found to have diverticulitis with possible small abscess with possible concominant recurrent c.difficile infection  C.difficile infection = does not appear to have had long taper oral vancomycin in her previous treatments. Recommend that she is placed on prolonged taper ( 125mg  QID x 14 days, 125mg  TID x 14 days, 125mg  BID x 14 days, 125mg  daily x 14 days, 125mg  every other day X 14 days. As an outpatient, we will see her back in the ID clinic to discuss if she would be candidate for FMT to help cure her of c.difficile infection. Success of FMT also depends on if she can minimize her antibiotic exposure.  Diverticulitis +/- small abscess = would finish up 6 more days of cipro and metronidazole, renally dosed. Consider repeat abdominal CT at end of antibiotic course to see if there is a need to extend antibiotic course.   Duke Salvia Drue Second MD MPH [facility] [phone]

## 2012-08-03 NOTE — Progress Notes (Signed)
Subjective:  Seen on HD, three episodes of loose stools since yesterday; no nausea or vomiting, but abdominal pain immediately prior to BMs.  Objective: Vital signs in last 24 hours: Temp:  [97.6 F (36.4 C)-98.5 F (36.9 C)] 98.5 F (36.9 C) (11/05 4098) Pulse Rate:  [81-102] 95  (11/05 0638) Resp:  [16-19] 16  (11/05 0638) BP: (122-151)/(52-83) 151/78 mmHg (11/05 0638) SpO2:  [95 %-100 %] 95 % (11/05 1191) Weight:  [78.2 kg (172 lb 6.4 oz)] 78.2 kg (172 lb 6.4 oz) (11/05 4782) Weight change: -0.1 kg (-3.5 oz)  Intake/Output from previous day: 11/04 0701 - 11/05 0700 In: 440 [P.O.:440] Out: -    EXAM: General appearance:  Alert, in no apparent distress Resp:  CTA without rales, rhonchi, or wheezes Cardio:  RRR without murmur or rub GI:  + BS, soft and nontender Extremities:  No edema Access:  Left IJ catheter, AVF @ RUA with + bruit  Lab Results:  Basename 08/02/12 0602 07/31/12 0750  WBC 13.9* 9.6  HGB 12.2 12.0  HCT 42.4 41.7  PLT 208 224   BMET:  Basename 08/02/12 0602 08/01/12 0645  NA 134* 133*  K 4.8 3.9  CL 97 96  CO2 24 24  GLUCOSE 92 135*  BUN 38* 29*  CREATININE 5.27* 4.29*  CALCIUM 9.8 9.2  ALBUMIN 2.9* --   No results found for this basename: PTH:2 in the last 72 hours Iron Studies: No results found for this basename: IRON,TIBC,TRANSFERRIN,FERRITIN in the last 72 hours  Dialysis Orders: Center: [facility].  EDW 79.5 HD Bath 2K 2.25 Ca Time 4 Heparin tight. Access right upper AVF and left I-J 400/A 1.5 Zemplar none mcg IV/HD Epogen 28K - changed to 21K 10/28 Units IV/HD Venofer 50/week   Assessment/Plan: 1. Sigmoid diverticulitis with recurrent C diff - still with frequent loose stools, on Flagyl 500 mg tid, Cipro 500 mg qd, and now oral Vancomycin.  2. ESRD - HD on TTS @ [facility]; using L IJ catheter, s/p AVF fistulogram on 07/07/12 with PTA of anastomotic stenosis and coil embolization of large competing branch per Dr. Myra Gianotti.  May try  to use AVF at next Tx. 3. HTN/Volume - BP currently 155/87, although recent drops during HD; wt 78.2 kg today, EDW 79.5. Need standing wts.  UF goal of 1 L today. 4. Anemia - Hgb 11.5 today, Fe sat 14% with ferritin 464; began Fe on 11/2, no Aranesp. 5. Metabolic bone disease - Ca 9.8 (10.7 corrected), P 5.1, iPTH 175, no Zemplar, on Phoslo 2 with meals. 6. Nutrition - Alb 2.9, diet advanced on 11/2. 7. DM - on SSI per primary. 8. ASCVD - on BB, O2 at home. 9. Hx renal cell cancer - followed by Urology at [facility]; MRI abdomen showed 2-cm cyst in lateral left kidney (benign), additional left renal cysts; s/p post right nephrectomy.  LOS: 7 days   [name],[name] 08/03/2012,6:45 AM   Patient seen and examined and agree with assessment and plan as above.  Vinson Moselle  MD [employer] [phone] pgr    [phone] cell 08/03/2012, 2:16 PM

## 2012-08-03 NOTE — Progress Notes (Addendum)
[name] [id number] Aug 22, 1941   Subjective:  Diarrhea persists Mild suprapubic pain w defecation No N/V In HD  Objective:  Vital signs:  Filed Vitals:   08/03/12 0651 08/03/12 0658 08/03/12 0715 08/03/12 0730  BP: 168/81 155/87 150/80 141/78  Pulse: 89 89 91 91  Temp:      TempSrc:      Resp: 16 18 18 18   Height:      Weight:      SpO2:        Last BM Date: 08/02/12  Intake/Output   Yesterday:  11/04 0701 - 11/05 0700 In: 440 [P.O.:440] Out: -  This shift:     Bowel function:  Flatus: y  BM: x6  Physical Exam:  General: Pt awake/alert/oriented x4 in no acute distress Eyes: PERRL, normal EOM.  Sclera clear.  No icterus Neuro: CN II-XII intact w/o focal sensory/motor deficits. Lymph: No head/neck/groin lymphadenopathy Psych:  No delerium/psychosis/paranoia HENT: Normocephalic, Mucus membranes moist.  No thrush Neck: Supple, No tracheal deviation Chest: No chest wall pain w good excursion CV:  Pulses intact.  Regular rhythm MS: Normal AROM mjr joints.  No obvious deformity Abdomen: Soft.  Nondistended.  Mildly tender at suprapubic region only.  No peritonitis.  No incarcerated hernias. Ext:  SCDs BLE.  No mjr edema.  No cyanosis Skin: No petechiae / purpurae  Problem List:  Principal Problem:  *Diverticulitis of sigmoid colon Active Problems:  End stage renal disease  DM2 (diabetes mellitus, type 2)  Anemia  Pulmonary edema  Clostridium difficile diarrhea   Assessment  [name]  71 y.o. female       Recurrent/persistent Cdiff  Diverticulitis but w some pain, esp w defecation  Plan:  -repeat CT scan to r/o abscess -Tx recurrent Cdiff as you are doing -ID consult to consider fecal transplant with recurrent Cdiff (d/w [facility] Dr. Arlyce Dice, told to go through ID 1st) - d/w Dr. Drue Second.  Probable plan to set up fecal transplant as outpt after diverticulitis more resolved - ID will see  -clears for now -bowel regimen   -VTE  prophylaxis- SCDs, etc -mobilize as tolerated to help recovery  Ardeth Sportsman, M.D., F.A.C.S. Gastrointestinal and Minimally Invasive Surgery [facility] [address], [address] [place], [place] [address] [phone] Main / Paging [phone] Voice Mail   08/03/2012  CARE TEAM:  PCP: [name], MD  Outpatient Care Team: Patient Care Team: Gordan Payment, MD as PCP - General Zada Girt, MD (Nephrology) Baldo Daub as Referring Physician (Cardiology)  Inpatient Treatment Team: Treatment Team: Attending Provider: [name], MD; Rounding Team: Md Montez Morita, MD; Rounding Team: Joanna Hews, MD; Consulting Physician: Irena Cords, MD; Registered Nurse: Janit Bern, RN; Registered Nurse: Dorthy Cooler, RN; Technician: Karma Ganja, [place]; Registered Nurse: Maureen Ralphs, RN; Respiratory Therapist: Toula Moos, RRT; Registered Nurse: Michaele Offer, RN   Results:   Labs: Results for orders placed during the hospital encounter of 07/27/12 (from the past 48 hour(s))  GLUCOSE, CAPILLARY     Status: Abnormal   Collection Time   08/01/12 11:37 AM      Component Value Range Comment   Glucose-Capillary 100 (*) 70 - 99 mg/dL    Comment 1 Documented in Chart      Comment 2 Notify RN     GLUCOSE, CAPILLARY     Status: Abnormal   Collection Time   08/01/12  4:35 PM  Component Value Range Comment   Glucose-Capillary 120 (*) 70 - 99 mg/dL    Comment 1 Documented in Chart      Comment 2 Notify RN     GLUCOSE, CAPILLARY     Status: Abnormal   Collection Time   08/01/12  9:20 PM      Component Value Range Comment   Glucose-Capillary 108 (*) 70 - 99 mg/dL   CBC     Status: Abnormal   Collection Time   08/02/12  6:02 AM      Component Value Range Comment   WBC 13.9 (*) 4.0 - 10.5 K/uL    RBC 4.00  3.87 - 5.11 MIL/uL    Hemoglobin 12.2  12.0 - 15.0 g/dL    HCT 19.1  47.8 - 29.5 %    MCV 106.0 (*) 78.0 - 100.0  fL    MCH 30.5  26.0 - 34.0 pg    MCHC 28.8 (*) 30.0 - 36.0 g/dL    RDW 62.1 (*) 30.8 - 15.5 %    Platelets 208  150 - 400 K/uL   RENAL FUNCTION PANEL     Status: Abnormal   Collection Time   08/02/12  6:02 AM      Component Value Range Comment   Sodium 134 (*) 135 - 145 mEq/L    Potassium 4.8  3.5 - 5.1 mEq/L DELTA CHECK NOTED   Chloride 97  96 - 112 mEq/L    CO2 24  19 - 32 mEq/L    Glucose, Bld 92  70 - 99 mg/dL    BUN 38 (*) 6 - 23 mg/dL    Creatinine, Ser 6.57 (*) 0.50 - 1.10 mg/dL    Calcium 9.8  8.4 - 84.6 mg/dL    Phosphorus 5.1 (*) 2.3 - 4.6 mg/dL    Albumin 2.9 (*) 3.5 - 5.2 g/dL    GFR calc non Af Amer 7 (*) >90 mL/min    GFR calc Af Amer 9 (*) >90 mL/min   GLUCOSE, CAPILLARY     Status: Normal   Collection Time   08/02/12  7:22 AM      Component Value Range Comment   Glucose-Capillary 94  70 - 99 mg/dL   GLUCOSE, CAPILLARY     Status: Abnormal   Collection Time   08/02/12 11:44 AM      Component Value Range Comment   Glucose-Capillary 113 (*) 70 - 99 mg/dL   GLUCOSE, CAPILLARY     Status: Abnormal   Collection Time   08/02/12  4:52 PM      Component Value Range Comment   Glucose-Capillary 121 (*) 70 - 99 mg/dL   GLUCOSE, CAPILLARY     Status: Abnormal   Collection Time   08/02/12  9:42 PM      Component Value Range Comment   Glucose-Capillary 112 (*) 70 - 99 mg/dL   CBC     Status: Abnormal   Collection Time   08/03/12  6:49 AM      Component Value Range Comment   WBC 12.8 (*) 4.0 - 10.5 K/uL    RBC 3.74 (*) 3.87 - 5.11 MIL/uL    Hemoglobin 11.5 (*) 12.0 - 15.0 g/dL    HCT 96.2  95.2 - 84.1 %    MCV 104.8 (*) 78.0 - 100.0 fL    MCH 30.7  26.0 - 34.0 pg    MCHC 29.3 (*) 30.0 - 36.0 g/dL    RDW 32.4 (*) 40.1 -  15.5 %    Platelets 215  150 - 400 K/uL   RENAL FUNCTION PANEL     Status: Abnormal   Collection Time   08/03/12  6:49 AM      Component Value Range Comment   Sodium 134 (*) 135 - 145 mEq/L    Potassium 4.3  3.5 - 5.1 mEq/L    Chloride 96  96 - 112  mEq/L    CO2 26  19 - 32 mEq/L    Glucose, Bld 94  70 - 99 mg/dL    BUN 47 (*) 6 - 23 mg/dL    Creatinine, Ser 1.61 (*) 0.50 - 1.10 mg/dL    Calcium 9.6  8.4 - 09.6 mg/dL    Phosphorus 6.4 (*) 2.3 - 4.6 mg/dL    Albumin 2.9 (*) 3.5 - 5.2 g/dL    GFR calc non Af Amer 6 (*) >90 mL/min    GFR calc Af Amer 7 (*) >90 mL/min     Imaging / Studies: No results found.  Medications / Allergies: per chart  Antibiotics: Anti-infectives     Start     Dose/Rate Route Frequency Ordered Stop   08/30/12 1000   vancomycin (VANCOCIN) 50 mg/mL oral solution 125 mg        125 mg Oral Every 3 DAYS 07/31/12 1310 09/14/12 0959   08/22/12 1000   vancomycin (VANCOCIN) 50 mg/mL oral solution 125 mg        125 mg Oral Every other day 07/31/12 1310 08/30/12 0959   08/15/12 1000   vancomycin (VANCOCIN) 50 mg/mL oral solution 125 mg        125 mg Oral Daily 07/31/12 1310 08/22/12 0959   08/07/12 2200   vancomycin (VANCOCIN) 50 mg/mL oral solution 125 mg        125 mg Oral 2 times daily 07/31/12 1310 08/14/12 2159   08/01/12 1400   metroNIDAZOLE (FLAGYL) tablet 500 mg        500 mg Oral 3 times per day 08/01/12 1334 08/11/12 2359   07/31/12 1400   vancomycin (VANCOCIN) 50 mg/mL oral solution 125 mg        125 mg Oral 4 times daily 07/31/12 1310 08/07/12 1359   07/30/12 2000   ciprofloxacin (CIPRO) tablet 500 mg        500 mg Oral Every 24 hours 07/30/12 0853 08/11/12 2359   07/30/12 1000   metroNIDAZOLE (FLAGYL) tablet 500 mg  Status:  Discontinued        500 mg Oral Every 12 hours 07/30/12 0853 08/01/12 1324   07/28/12 0130   ciprofloxacin (CIPRO) IVPB 400 mg  Status:  Discontinued        400 mg 200 mL/hr over 60 Minutes Intravenous Every 24 hours 07/28/12 0035 07/30/12 0853   07/28/12 0100   metroNIDAZOLE (FLAGYL) IVPB 500 mg  Status:  Discontinued        500 mg 100 mL/hr over 60 Minutes Intravenous Every 8 hours 07/28/12 0033 07/30/12 0853

## 2012-08-03 NOTE — Progress Notes (Signed)
Pt. CBG was 68,due to poor intake,after gave some ginger ale, the CBG was 132.keep monitoring pt. closely

## 2012-08-03 NOTE — Progress Notes (Signed)
[facility] PROGRESS NOTE  [name] RUE:[id number] DOB: 12/20/1940 DOA: 07/27/2012 PCP: [name], MD  Assessment/Plan: #1. Sigmoid diverticulitis with possible abscess with recurrent C. difficile last May this year(per patient this current episode is her fourth) -per IR abscess too small to place drain -pt was improving clinically, changed to po abx per surgery>>11/1 -started on oral vancomycin to be tapered as per discussion with GI(curbside) on 11/2 Given the recurrent C. difficile that -Discussed with surgery - Dr Janee Morn 11/3 and he states pt needs to be on both flagyl and cipro for the diverticulitis with abscess for 14days total  as well coverage oral vanc for her recurrent c.diff. -Pt still with diarrhea, Continue antibiotics as above oral vanc taper, Cipro and Flagyl -appreciate surgery assistance -#2. ESRD on hemodialysis - patient missed her dialysis 10/29 due to the pain. Patient usually gets dialyzed on Tuesday Thursday and Saturdays. -she was SOB on 10/30, CXR with pulm edema and she was dialyzed -per renal #3. CAD status post stenting -  chest pain free. Patient states she was taken off Plavix one year ago. Continue Coreg.  #4. Diabetes mellitus2  -holding Glucotrol -continue sliding-scale coverage.  #5. Left kidney lesion as per the CAT scan done at [facility] -  2.2 cm. MRI with 2cm cyst - benign. Pt states she was aware of this lesion and had been followed in the past at [facility].   Code Status: full Family Communication: directly with pt at bedside Disposition Plan: home when medically ready   Consultants:  CCS  Renal  Procedures:  none  Antibiotics:  cipro & flagyl - started on 10/30 Oral vanc started on 11/2 HPI/Subjective: Still with diarrhea, denies abdominal pain.  Objective: Filed Vitals:   08/03/12 0930 08/03/12 1000 08/03/12 1030 08/03/12 1051  BP: 139/63 136/80 132/76 166/74  Pulse: 85 84 86 85  Temp:    98.7 F (37.1 C)    TempSrc:    Oral  Resp: 20 18 20 18   Height:      Weight:    77.2 kg (170 lb 3.1 oz)  SpO2:    100%    Intake/Output Summary (Last 24 hours) at 08/03/12 1157 Last data filed at 08/03/12 1051  Gross per 24 hour  Intake    320 ml  Output    991 ml  Net   -671 ml   Filed Weights   08/02/12 2138 08/03/12 0638 08/03/12 1051  Weight: 78.2 kg (172 lb 6.4 oz) 78.2 kg (172 lb 6.4 oz) 77.2 kg (170 lb 3.1 oz)    Exam:   General:  A&O x 3, in NAD  Cardiovascular: RRR, nl S1S2  Respiratory: CTA, no crackles or wheezes  Abdomen: soft +BS, mild right-sided tenderness to palpation, nondistended  Data Reviewed: Basic Metabolic Panel:  Lab 08/03/12 1478 08/02/12 0602 08/01/12 0645 07/31/12 0750 07/30/12 0828 07/29/12 0450  NA 134* 134* 133* 137 135 --  K 4.3 4.8 3.9 3.9 3.5 --  CL 96 97 96 98 94* --  CO2 26 24 24 29 27  --  GLUCOSE 94 92 135* 101* 192* --  BUN 47* 38* 29* 23 35* --  CREATININE 6.18* 5.27* 4.29* 3.66* 4.78* --  CALCIUM 9.6 9.8 9.2 9.0 8.8 --  MG -- -- -- -- -- --  PHOS 6.4* 5.1* -- -- 6.2* 6.4*   Liver Function Tests:  Lab 08/03/12 2956 08/02/12 0602 07/30/12 0828 07/29/12 0450 07/28/12 0136  AST -- -- -- -- 24  ALT -- -- -- --  12  ALKPHOS -- -- -- -- 80  BILITOT -- -- -- -- 0.2*  PROT -- -- -- -- 6.7  ALBUMIN 2.9* 2.9* 2.7* 2.7* 2.7*   No results found for this basename: LIPASE:5,AMYLASE:5 in the last 168 hours No results found for this basename: AMMONIA:5 in the last 168 hours CBC:  Lab 08/03/12 0649 08/02/12 0602 07/31/12 0750 07/30/12 0828 07/29/12 0450 07/28/12 0136  WBC 12.8* 13.9* 9.6 8.4 9.1 --  NEUTROABS -- -- -- -- -- 9.5*  HGB 11.5* 12.2 12.0 11.1* 11.3* --  HCT 39.2 42.4 41.7 39.0 40.5 --  MCV 104.8* 106.0* 107.8* 107.1* 109.5* --  PLT 215 208 224 222 225 --   Cardiac Enzymes: No results found for this basename: CKTOTAL:5,CKMB:5,CKMBINDEX:5,TROPONINI:5 in the last 168 hours BNP (last 3 results) No results found for this basename:  PROBNP:3 in the last 8760 hours CBG:  Lab 08/02/12 2142 08/02/12 1652 08/02/12 1144 08/02/12 0722 08/01/12 2120  GLUCAP 112* 121* 113* 94 108*    Recent Results (from the past 240 hour(s))  URINE CULTURE     Status: Normal   Collection Time   07/28/12  5:21 AM      Component Value Range Status Comment   Specimen Description URINE, RANDOM   Final    Special Requests NONE   Final    Culture  Setup Time 07/28/2012 08:40   Final    Colony Count >=100,000 COLONIES/ML   Final    Culture     Final    Value: Multiple bacterial morphotypes present, none predominant. Suggest appropriate recollection if clinically indicated.   Report Status 07/29/2012 FINAL   Final   CLOSTRIDIUM DIFFICILE BY PCR     Status: Abnormal   Collection Time   07/30/12  1:43 PM      Component Value Range Status Comment   C difficile by pcr POSITIVE (*) NEGATIVE Final      Studies: No results found.  Scheduled Meds:    . bismuth subsalicylate  30 mL Oral BID  . calcium acetate  1,334 mg Oral TID WC  . carvedilol  25 mg Oral BID WC  . ciprofloxacin  500 mg Oral Q24H  . ferric gluconate (FERRLECIT/NULECIT) IV  125 mg Intravenous Q T,Th,Sa-HD  . fluticasone  1 puff Inhalation BID  . hydrALAZINE  10 mg Intravenous Once  . insulin aspart  0-5 Units Subcutaneous QHS  . insulin aspart  0-9 Units Subcutaneous TID WC  . metroNIDAZOLE  500 mg Oral Q8H  . multivitamin  1 tablet Oral QHS  . psyllium  1 packet Oral BID  . rOPINIRole  1 mg Oral TID  . saccharomyces boulardii  250 mg Oral BID  . sodium chloride  3 mL Intravenous Q12H  . vancomycin  125 mg Oral QID   Followed by  . vancomycin  125 mg Oral BID   Followed by  . vancomycin  125 mg Oral Daily   Followed by  . vancomycin  125 mg Oral QODAY   Followed by  . vancomycin  125 mg Oral Q3 days   Continuous Infusions:   Principal Problem:  *Diverticulitis of sigmoid colon Active Problems:  End stage renal disease  DM2 (diabetes mellitus, type 2)   Anemia  Pulmonary edema  Clostridium difficile diarrhea    Time spent:    [name]  [facility] Pager [phone]. If 8PM-8AM, please contact night-coverage at [email or web address], password [facility] 08/03/2012, 11:57 AM  LOS: 7 days

## 2012-08-03 NOTE — Progress Notes (Signed)
ANTIBIOTIC CONSULT NOTE - FOLLOW UP  Pharmacy Consult for Ciprofloxacin Indication: Sigmoid diverticulitis with abscess  Vital Signs: Temp: 98.7 F (37.1 C) (11/05 1051) Temp src: Oral (11/05 1051) BP: 166/74 mmHg (11/05 1051) Pulse Rate: 85  (11/05 1051)  Labs:  [facility] 08/03/12 0649 08/02/12 0602 08/01/12 0645  WBC 12.8* 13.9* --  HGB 11.5* 12.2 --  PLT 215 208 --  LABCREA -- -- --  CREATININE 6.18* 5.27* 4.29*   Microbiology: Recent Results (from the past 720 hour(s))  URINE CULTURE     Status: Normal   Collection Time   07/28/12  5:21 AM      Component Value Range Status Comment   Specimen Description URINE, RANDOM   Final    Special Requests NONE   Final    Culture  Setup Time 07/28/2012 08:40   Final    Colony Count >=100,000 COLONIES/ML   Final    Culture     Final    Value: Multiple bacterial morphotypes present, none predominant. Suggest appropriate recollection if clinically indicated.   Report Status 07/29/2012 FINAL   Final   CLOSTRIDIUM DIFFICILE BY PCR     Status: Abnormal   Collection Time   07/30/12  1:43 PM      Component Value Range Status Comment   C difficile by pcr POSITIVE (*) NEGATIVE Final     Antibiotics: Ciprofloxacin 500 mg daily Flagyl 500 mg every 8 hours Vancomycin po taper  Assessment: 71 y.o. F who continues on Ciprofloxacin-Rx + Flagyl-MD for empiric diverticulitis with abscess coverage. The patient is noted to also be positive for *recurrent CDiff and on an oral  Vancomycin taper + Florastor (Flagyl also aids in treating CDiff)  The use of concurrent antibiotics with an active CDiff infection has been addressed with both Dr. Suanne Marker (primary MD) and Dr. Janee Morn (surgery consult). Per surgery, the patient needs to remain on Cipro + Flagyl for 14 days (thru 11/13) -- stop dates have been entered. Please reconsider this decision and weigh risks vs benefits if the patient's CDiff does not show any improvement.   It is also noted that ID  is being consulted for possible fecal transplantation. Will f/u with ID recommendations.   Goal of Therapy:  Proper antibiotics for infection/cultures adjusted for renal/hepatic function   Plan:  1. Continue Cipro 500 mg po daily (stop date entered for 11/13) 2. Will continue to follow renal function, culture results, LOT, and antibiotic de-escalation plans   Georgina Pillion, PharmD, BCPS Clinical Pharmacist Pager: [phone] 08/03/2012 11:16 AM

## 2012-08-04 LAB — GLUCOSE, CAPILLARY
Glucose-Capillary: 91 mg/dL (ref 70–99)
Glucose-Capillary: 138 mg/dL — ABNORMAL HIGH (ref 70–99)
Glucose-Capillary: 125 mg/dL — ABNORMAL HIGH (ref 70–99)

## 2012-08-04 MED ORDER — HEPARIN SODIUM (PORCINE) 1000 UNIT/ML DIALYSIS
20.0000 [IU]/kg | INTRAMUSCULAR | Status: DC | PRN
  Filled 2012-08-04: qty 2

## 2012-08-04 NOTE — Progress Notes (Signed)
[facility]   Phone  [phone]   FAX  [phone] [address]  Vancomycin solution can be compounded.  Fax or patient can bring script to pharmacy

## 2012-08-04 NOTE — Progress Notes (Signed)
[facility] PROGRESS NOTE  [name]:[id number] DOB: Oct 21, 1940 DOA: 07/27/2012 PCP: [name], MD  Assessment/Plan: #1. 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 -Pt still with diarrhea, Continue antibiotics as above oral vanc taper, Cipro and Flagyl -appreciate surgery and ID assistance -FU in ID clinic for FMT  -#2. ESRD on hemodialysis - patient missed her dialysis 10/29 due to the pain. Patient usually gets dialyzed on Tuesday Thursday and Saturdays. -she was SOB on 10/30, CXR with pulm edema and she was dialyzed -per renal  #3. CAD status post stenting -  chest pain free. Patient states she was taken off Plavix one year ago. Continue Coreg.   #4. Diabetes mellitus2  -holding Glucotrol -continue sliding-scale coverage.   #5. Left kidney lesion as per the CAT scan done at [facility] -  2.2 cm. MRI with 2cm cyst - benign. Pt states she was aware of this lesion and had been followed in the past at [facility].  Pt/Ot ambulate  Code Status: full Family Communication: directly with pt at bedside Disposition Plan: home hopefully tomorrow   Consultants:  CCS  Renal  Procedures:  none  Antibiotics:  cipro & flagyl - started on 10/30 Oral vanc started on 11/2  HPI/Subjective: 11 episodes of diarrhea yesterday, better this am  Objective: Filed Vitals:   08/03/12 1739 08/03/12 1936 08/03/12 2130 08/04/12 0758  BP: 135/71  114/48   Pulse: 84 83 78   Temp: 98.2 F  (36.8 C)  97.8 F (36.6 C)   TempSrc: Oral  Oral   Resp: 18 18 18    Height:      Weight:   78.8 kg (173 lb 11.6 oz)   SpO2: 100% 100% 98% 98%    Intake/Output Summary (Last 24 hours) at 08/04/12 0934 Last data filed at 08/04/12 0100  Gross per 24 hour  Intake    930 ml  Output    991 ml  Net    -61 ml   Filed Weights   08/03/12 0638 08/03/12 1051 08/03/12 2130  Weight: 78.2 kg (172 lb 6.4 oz) 77.2 kg (170 lb 3.1 oz) 78.8 kg (173 lb 11.6 oz)    Exam:   General:  A&O x 3, in NAD  Cardiovascular: RRR, nl S1S2  Respiratory: CTA, no crackles or wheezes  Abdomen: soft +BS, mild right-sided tenderness to palpation, nondistended  Data Reviewed: Basic Metabolic Panel:  Lab 08/03/12 9562 08/02/12 0602 08/01/12 0645 07/31/12 0750 07/30/12 0828 07/29/12 0450  NA 134* 134* 133* 137 135 --  K 4.3 4.8 3.9 3.9 3.5 --  CL 96 97 96 98 94* --  CO2 26 24 24 29 27  --  GLUCOSE 94 92 135* 101* 192* --  BUN 47* 38* 29* 23 35* --  CREATININE 6.18* 5.27* 4.29* 3.66* 4.78* --  CALCIUM 9.6 9.8 9.2 9.0 8.8 --  MG -- -- -- -- -- --  PHOS 6.4* 5.1* -- -- 6.2* 6.4*   Liver Function Tests:  Lab 08/03/12 0649 08/02/12  0602 07/30/12 0828 07/29/12 0450  AST -- -- -- --  ALT -- -- -- --  ALKPHOS -- -- -- --  BILITOT -- -- -- --  PROT -- -- -- --  ALBUMIN 2.9* 2.9* 2.7* 2.7*   No results found for this basename: LIPASE:5,AMYLASE:5 in the last 168 hours No results found for this basename: AMMONIA:5 in the last 168 hours CBC:  Lab 08/03/12 0649 08/02/12 0602 07/31/12 0750 07/30/12 0828 07/29/12 0450  WBC 12.8* 13.9* 9.6 8.4 9.1  NEUTROABS -- -- -- -- --  HGB 11.5* 12.2 12.0 11.1* 11.3*  HCT 39.2 42.4 41.7 39.0 40.5  MCV 104.8* 106.0* 107.8* 107.1* 109.5*  PLT 215 208 224 222 225   Cardiac Enzymes: No results found for this basename: CKTOTAL:5,CKMB:5,CKMBINDEX:5,TROPONINI:5 in the last 168 hours BNP (last 3 results) No results found for this basename: PROBNP:3 in the last 8760  hours CBG:  Lab 08/04/12 0735 08/03/12 2125 08/03/12 1851 08/03/12 1651 08/03/12 1202  GLUCAP 99 98 132* 68* 211*    Recent Results (from the past 240 hour(s))  URINE CULTURE     Status: Normal   Collection Time   07/28/12  5:21 AM      Component Value Range Status Comment   Specimen Description URINE, RANDOM   Final    Special Requests NONE   Final    Culture  Setup Time 07/28/2012 08:40   Final    Colony Count >=100,000 COLONIES/ML   Final    Culture     Final    Value: Multiple bacterial morphotypes present, none predominant. Suggest appropriate recollection if clinically indicated.   Report Status 07/29/2012 FINAL   Final   CLOSTRIDIUM DIFFICILE BY PCR     Status: Abnormal   Collection Time   07/30/12  1:43 PM      Component Value Range Status Comment   C difficile by pcr POSITIVE (*) NEGATIVE Final      Studies: Ct Abdomen Pelvis Wo Contrast  08/03/2012  *RADIOLOGY REPORT*  Clinical Data: Recent diagnosis of diverticulitis, presenting now with increasing leukocytosis, diarrhea, and C difficile.  History of end-stage renal disease.  CT ABDOMEN AND PELVIS WITHOUT CONTRAST  Technique:  Multidetector CT imaging of the abdomen and pelvis was performed following the standard protocol without intravenous contrast.  Comparison: CT abdomen and pelvis 07/27/2012, 08/03/2009, 05/03/2009 [facility].  Findings: Since the examination 07/27/2012, interval resolution of the acute diverticulitis.  Extensive sigmoid colon diverticulosis persists, without evidence of acute inflammation currently. Diverticulosis involving the descending colon, again without evidence of diverticulitis.  No colonic wall thickening to confirm a diagnosis of pseudomembranous colitis.  Hyperemia involving the sigmoid colon with several scattered reactive nodes in the adjacent mesentery as noted previously.  Stomach decompressed and unremarkable.  Normal-appearing small bowel.  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 Appendix surgically absent.  No ascites.  Normal unenhanced appearance of the liver, spleen, pancreas, and right adrenal gland.  Mild enlargement of the left adrenal gland without nodularity.  Gallbladder unremarkable by CT.  No biliary ductal dilation. Simple cyst in the mid left kidney; within the limits of the unenhanced technique, no significant focal left renal parenchymal abnormalities.  Absent right kidney.  Severe aorto- iliofemoral atherosclerosis without aneurysm.  No significant lymphadenopathy.  Uterus surgically absent.  No adnexal masses or free pelvic fluid. Urinary bladder decompressed and unremarkable.  Phleboliths in both sides of the low pelvis.  Visualized lung bases emphysematous but clear.  Extensive  mitral annular calcification and aortic annular calcification.  Bone window images demonstrate facet degenerative changes involving the lower lumbar spine.  IMPRESSION:  1.  Interval resolution of acute diverticulitis since the 07/27/2012 examination. 2.  No colonic wall thickening to confirm a diagnosis of pseudomembranous colitis. 3.  Extensive descending and sigmoid colon diverticulosis without evidence of acute diverticulitis currently. 4.  Stable mild left adrenal hyperplasia.   Original Report Authenticated By: Hulan Saas, M.D.     Scheduled Meds:    . bismuth subsalicylate  30 mL Oral BID  . calcium acetate  1,334 mg Oral TID WC  . carvedilol  25 mg Oral BID WC  . ciprofloxacin  500 mg Oral Q24H  . ferric gluconate (FERRLECIT/NULECIT) IV  125 mg Intravenous Q T,Th,Sa-HD  . fluticasone  1 puff Inhalation BID  . hydrALAZINE  10 mg Intravenous Once  . insulin aspart  0-5 Units Subcutaneous QHS  . insulin aspart  0-9 Units Subcutaneous TID WC  . [COMPLETED] iohexol  20 mL Oral Q1 Hr x 2  . metroNIDAZOLE  500 mg Oral Q8H  . multivitamin  1 tablet Oral QHS  . psyllium  1 packet Oral BID  . rOPINIRole  1 mg Oral TID  . saccharomyces boulardii  250 mg Oral  BID  . sodium chloride  3 mL Intravenous Q12H  . vancomycin  125 mg Oral QID   Followed by  . vancomycin  125 mg Oral BID   Followed by  . vancomycin  125 mg Oral Daily   Followed by  . vancomycin  125 mg Oral QODAY   Followed by  . vancomycin  125 mg Oral Q3 days   Continuous Infusions:   Principal Problem:  *Diverticulitis of sigmoid colon Active Problems:  End stage renal disease  DM2 (diabetes mellitus, type 2)  Anemia  Pulmonary edema  Clostridium difficile diarrhea    Time spent:    [facility]  [facility] Pager [phone]. If 8PM-8AM, please contact night-coverage at [email or web address], password [facility] 08/04/2012, 9:34 AM  LOS: 8 days

## 2012-08-04 NOTE — Progress Notes (Signed)
Subjective:  No current complaints, one loose BM so far today, abdominal pain prior to BM better.  Objective: Vital signs in last 24 hours: Temp:  [97.8 F (36.6 C)-98.7 F (37.1 C)] 97.8 F (36.6 C) (11/05 2130) Pulse Rate:  [78-87] 78  (11/05 2130) Resp:  [18-20] 18  (11/05 2130) BP: (114-166)/(48-76) 139/61 mmHg (11/06 0953) SpO2:  [98 %-100 %] 98 % (11/06 0758) Weight:  [77.2 kg (170 lb 3.1 oz)-78.8 kg (173 lb 11.6 oz)] 78.8 kg (173 lb 11.6 oz) (11/05 2130) Weight change: -1 kg (-2 lb 3.3 oz)  Intake/Output from previous day: 11/05 0701 - 11/06 0700 In: 930 [P.O.:600; IV Piggyback:330] Out: 991    EXAM: General appearance:  Alert, in no apparent distress Resp:  CTA without rales, rhonchi, or wheezes Cardio:  RRR without murmur or rub GI:  + BS, soft and nontender Extremities:  No edema  Access:  Left IJ catheter, AVF @ RUA with + bruit  Lab Results:  Basename 08/03/12 0649 08/02/12 0602  WBC 12.8* 13.9*  HGB 11.5* 12.2  HCT 39.2 42.4  PLT 215 208   BMET:  Basename 08/03/12 0649 08/02/12 0602  NA 134* 134*  K 4.3 4.8  CL 96 97  CO2 26 24  GLUCOSE 94 92  BUN 47* 38*  CREATININE 6.18* 5.27*  CALCIUM 9.6 9.8  ALBUMIN 2.9* 2.9*   No results found for this basename: PTH:2 in the last 72 hours Iron Studies: No results found for this basename: IRON,TIBC,TRANSFERRIN,FERRITIN in the last 72 hours  Dialysis Orders: Center: [facility] Optiflux 160.  EDW 79.5 HD Bath 2K 2.25 Ca Time 4 Heparin tight. Access right upper AVF and left I-J 400/A 1.5 Zemplar none mcg IV/HD Epogen 28K - changed to 21K 10/28 Units IV/HD Venofer 50/week   Assessment/Plan: 1. Sigmoid diverticulitis/recurrent C diff - still with frequent loose stools, but improving, on Flagyl 500 mg tid, Cipro 500 mg qd, and now oral Vancomycin. CT abd yesterday showed resolution of sigmoid tic abcess seen on prior 10/29 study.   2. ESRD - HD on TTS @ [facility]; using L IJ catheter, s/p AVF fistulogram on 07/07/12 with  PTA of anastomotic stenosis and coil embolization of large competing branch per Dr. Myra Gianotti. May try to use AVF at next Tx. 3. HTN/Volume - BP currently 139/61, although recent drops during HD; wt 78.8 kg s/p net UF 991 ml yesterday, EDW 79.5. Need standing wts. 4. Anemia - Hgb 11.5 yesterday, Fe sat 14% with ferritin 464; began Fe on 11/2, no Aranesp. 5. Metabolic bone disease - Ca 9.6 (10.7 corrected), P 6.4, iPTH 175, no Zemplar, on Phoslo 2 with meals. 6. Nutrition - Alb 2.9, diet advanced on 11/2. 7. DM - on SSI per primary. 8. ASCVD - on BB, O2 at home. 9. Hx renal cell cancer - followed by Urology at [facility]; MRI abdomen showed 2-cm cyst in lateral left kidney (benign), additional left renal cysts; s/p post right nephrectomy.  LOS: 8 days   LYLES,CHARLES 08/04/2012,10:07 AM  Patient seen and examined and agree with assessment and plan as above with additions as indicated.  Vinson Moselle  MD [facility] [phone] pgr    [phone] cell 08/04/2012, 10:58 AM

## 2012-08-04 NOTE — Progress Notes (Signed)
[facility]    Date of Admission:  07/27/2012    Total days of antibiotics 9 Day 9 cipro  Day 9 metronidazole  Day 4 po vanco   ID: [name] is a 71 y.o. female with history of recurrent c.difficile infection who presents with diverticulitis and concominant c.difficile infection  24hr: underwent abd ct which showed resolution of diverticulitis and no abscess  Principal Problem:  *Diverticulitis of sigmoid colon Active Problems:  End stage renal disease  DM2 (diabetes mellitus, type 2)  Anemia  Pulmonary edema  Clostridium difficile diarrhea    Subjective: Having less bowel movements, has had 4 today, whereas yesterday she had 11. No blood or abdominal cramping with stool. Eating without difficulty. No fever,chills, nightsweats  Medications:     . bismuth subsalicylate  30 mL Oral BID  . calcium acetate  1,334 mg Oral TID WC  . carvedilol  25 mg Oral BID WC  . ciprofloxacin  500 mg Oral Q24H  . ferric gluconate (FERRLECIT/NULECIT) IV  125 mg Intravenous Q T,Th,Sa-HD  . fluticasone  1 puff Inhalation BID  . hydrALAZINE  10 mg Intravenous Once  . insulin aspart  0-5 Units Subcutaneous QHS  . insulin aspart  0-9 Units Subcutaneous TID WC  . [COMPLETED] iohexol  20 mL Oral Q1 Hr x 2  . metroNIDAZOLE  500 mg Oral Q8H  . multivitamin  1 tablet Oral QHS  . psyllium  1 packet Oral BID  . rOPINIRole  1 mg Oral TID  . saccharomyces boulardii  250 mg Oral BID  . sodium chloride  3 mL Intravenous Q12H  . vancomycin  125 mg Oral QID   Followed by  . vancomycin  125 mg Oral BID   Followed by  . vancomycin  125 mg Oral Daily   Followed by  . vancomycin  125 mg Oral QODAY   Followed by  . vancomycin  125 mg Oral Q3 days    Objective: Vital signs in last 24 hours: Temp:  [97.8 F (36.6 C)-98.7 F (37.1 C)] 97.8 F (36.6 C) (11/05 2130) Pulse Rate:  [78-87] 78  (11/05 2130) Resp:  [18] 18  (11/05 2130) BP: (114-166)/(48-74) 139/61 mmHg  (11/06 0953) SpO2:  [98 %-100 %] 98 % (11/06 0758) Weight:  [170 lb 3.1 oz (77.2 kg)-173 lb 11.6 oz (78.8 kg)] 173 lb 11.6 oz (78.8 kg) (11/05 2130)   gen = a xo by 3 in NAD abd = nontender, nondistended, BS+, no guarding  Lab Results  [facility] 08/03/12 0649 08/02/12 0602  WBC 12.8* 13.9*  HGB 11.5* 12.2  HCT 39.2 42.4  NA 134* 134*  K 4.3 4.8  CL 96 97  CO2 26 24  BUN 47* 38*  CREATININE 6.18* 5.27*  GLU -- --   Liver Panel  Basename 08/03/12 0649 08/02/12 0602  PROT -- --  ALBUMIN 2.9* 2.9*  AST -- --  ALT -- --  ALKPHOS -- --  BILITOT -- --  BILIDIR -- --  IBILI -- --    Microbiology: 11/1 cdiff positive  10/30 urine cx NGTD  Studies/Results: Ct Abdomen Pelvis Wo Contrast  08/03/2012  *RADIOLOGY REPORT*  Clinical Data: Recent diagnosis of diverticulitis, presenting now with increasing leukocytosis, diarrhea, and C difficile.  History of end-stage renal disease.  CT ABDOMEN AND PELVIS WITHOUT CONTRAST  Technique:  Multidetector CT imaging of the abdomen and pelvis was performed following the standard protocol without intravenous contrast.  Comparison:  CT abdomen and pelvis 07/27/2012, 08/03/2009, 05/03/2009 [facility].  Findings: Since the examination 07/27/2012, interval resolution of the acute diverticulitis.  Extensive sigmoid colon diverticulosis persists, without evidence of acute inflammation currently. Diverticulosis involving the descending colon, again without evidence of diverticulitis.  No colonic wall thickening to confirm a diagnosis of pseudomembranous colitis.  Hyperemia involving the sigmoid colon with several scattered reactive nodes in the adjacent mesentery as noted previously.  Stomach decompressed and unremarkable.  Normal-appearing small bowel.  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 Appendix surgically absent.  No ascites.  Normal unenhanced appearance of the liver, spleen, pancreas, and right adrenal gland.  Mild enlargement of  the left adrenal gland without nodularity.  Gallbladder unremarkable by CT.  No biliary ductal dilation. Simple cyst in the mid left kidney; within the limits of the unenhanced technique, no significant focal left renal parenchymal abnormalities.  Absent right kidney.  Severe aorto- iliofemoral atherosclerosis without aneurysm.  No significant lymphadenopathy.  Uterus surgically absent.  No adnexal masses or free pelvic fluid. Urinary bladder decompressed and unremarkable.  Phleboliths in both sides of the low pelvis.  Visualized lung bases emphysematous but clear.  Extensive mitral annular calcification and aortic annular calcification.  Bone window images demonstrate facet degenerative changes involving the lower lumbar spine.  IMPRESSION:  1.  Interval resolution of acute diverticulitis since the 07/27/2012 examination. 2.  No colonic wall thickening to confirm a diagnosis of pseudomembranous colitis. 3.  Extensive descending and sigmoid colon diverticulosis without evidence of acute diverticulitis currently. 4.  Stable mild left adrenal hyperplasia.   Original Report Authenticated By: Hulan Saas, M.D.      Assessment/Plan: Diverticulitis = improved on CT. Patient denies abdominal pain. Would stop cipro and metronidazole since she has received 8 days of treatment, and would focus on treating c.difficile alone  C.difficile = continue with vancomycin 125mg  QID  x 14 days, then 125mg  TID x 14 days, 125mg  BID x 14 days, 125mg  daily x 14 days, 125mg  every other day X 14 days. As an outpatient, we will see her back in the ID clinic to discuss if she would be candidate for FMT to help cure her of c.difficile infection. Success of FMT also depends on if she can minimize her antibiotic exposure.  Will sign off  We will see her back in 4 wks in ID clinic.   Drue Second [facility] Cell: [phone] Pager: [phone]  08/04/2012, 10:35 AM

## 2012-08-04 NOTE — Progress Notes (Signed)
Diarrhea better today.  No fevers, nausea or abd pain.    Agree with PA note.  Abx per ID

## 2012-08-04 NOTE — Progress Notes (Signed)
Subjective: Patient feels great despite the persisting diarrhea (11 episodes of very loose stools yesterday), only one episode today after breakfast.  No n/v or abdominal pain, no fever/chills.    Objective: Vital signs in last 24 hours: Temp:  [97.8 F (36.6 C)-98.7 F (37.1 C)] 97.8 F (36.6 C) (11/05 2130) Pulse Rate:  [78-87] 78  (11/05 2130) Resp:  [18-20] 18  (11/05 2130) BP: (114-166)/(48-76) 139/61 mmHg (11/06 0953) SpO2:  [98 %-100 %] 98 % (11/06 0758) Weight:  [170 lb 3.1 oz (77.2 kg)-173 lb 11.6 oz (78.8 kg)] 173 lb 11.6 oz (78.8 kg) (11/05 2130) Last BM Date: 08/04/12  Intake/Output from previous day: 11/05 0701 - 11/06 0700 In: 930 [P.O.:600; IV Piggyback:330] Out: 991  Intake/Output this shift:    PE: Gen:  Alert, NAD, pleasant Abd: Soft, NT/ND, +BS   Lab Results:   [facility] 08/03/12 0649 08/02/12 0602  WBC 12.8* 13.9*  HGB 11.5* 12.2  HCT 39.2 42.4  PLT 215 208   BMET  Basename 08/03/12 0649 08/02/12 0602  NA 134* 134*  K 4.3 4.8  CL 96 97  CO2 26 24  GLUCOSE 94 92  BUN 47* 38*  CREATININE 6.18* 5.27*  CALCIUM 9.6 9.8   PT/INR No results found for this basename: LABPROT:2,INR:2 in the last 72 hours CMP     Component Value Date/Time   NA 134* 08/03/2012 0649   K 4.3 08/03/2012 0649   CL 96 08/03/2012 0649   CO2 26 08/03/2012 0649   GLUCOSE 94 08/03/2012 0649   BUN 47* 08/03/2012 0649   CREATININE 6.18* 08/03/2012 0649   CALCIUM 9.6 08/03/2012 0649   PROT 6.7 07/28/2012 0136   ALBUMIN 2.9* 08/03/2012 0649   AST 24 07/28/2012 0136   ALT 12 07/28/2012 0136   ALKPHOS 80 07/28/2012 0136   BILITOT 0.2* 07/28/2012 0136   GFRNONAA 6* 08/03/2012 0649   GFRAA 7* 08/03/2012 0649   Lipase  No results found for this basename: lipase       Studies/Results: Ct Abdomen Pelvis Wo Contrast  08/03/2012  *RADIOLOGY REPORT*  Clinical Data: Recent diagnosis of diverticulitis, presenting now with increasing leukocytosis, diarrhea, and C difficile.   History of end-stage renal disease.  CT ABDOMEN AND PELVIS WITHOUT CONTRAST  Technique:  Multidetector CT imaging of the abdomen and pelvis was performed following the standard protocol without intravenous contrast.  Comparison: CT abdomen and pelvis 07/27/2012, 08/03/2009, 05/03/2009 [facility].  Findings: Since the examination 07/27/2012, interval resolution of the acute diverticulitis.  Extensive sigmoid colon diverticulosis persists, without evidence of acute inflammation currently. Diverticulosis involving the descending colon, again without evidence of diverticulitis.  No colonic wall thickening to confirm a diagnosis of pseudomembranous colitis.  Hyperemia involving the sigmoid colon with several scattered reactive nodes in the adjacent mesentery as noted previously.  Stomach decompressed and unremarkable.  Normal-appearing small bowel.  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 Appendix surgically absent.  No ascites.  Normal unenhanced appearance of the liver, spleen, pancreas, and right adrenal gland.  Mild enlargement of the left adrenal gland without nodularity.  Gallbladder unremarkable by CT.  No biliary ductal dilation. Simple cyst in the mid left kidney; within the limits of the unenhanced technique, no significant focal left renal parenchymal abnormalities.  Absent right kidney.  Severe aorto- iliofemoral atherosclerosis without aneurysm.  No significant lymphadenopathy.  Uterus surgically absent.  No adnexal masses or free pelvic fluid. Urinary bladder decompressed and unremarkable.  Phleboliths in both sides of  the low pelvis.  Visualized lung bases emphysematous but clear.  Extensive mitral annular calcification and aortic annular calcification.  Bone window images demonstrate facet degenerative changes involving the lower lumbar spine.  IMPRESSION:  1.  Interval resolution of acute diverticulitis since the 07/27/2012 examination. 2.  No colonic wall thickening to confirm a  diagnosis of pseudomembranous colitis. 3.  Extensive descending and sigmoid colon diverticulosis without evidence of acute diverticulitis currently. 4.  Stable mild left adrenal hyperplasia.   Original Report Authenticated By: Hulan Saas, M.D.     Anti-infectives: Anti-infectives     Start     Dose/Rate Route Frequency Ordered Stop   08/30/12 1000   vancomycin (VANCOCIN) 50 mg/mL oral solution 125 mg        125 mg Oral Every 3 DAYS 07/31/12 1310 09/14/12 0959   08/22/12 1000   vancomycin (VANCOCIN) 50 mg/mL oral solution 125 mg        125 mg Oral Every other day 07/31/12 1310 08/30/12 0959   08/15/12 1000   vancomycin (VANCOCIN) 50 mg/mL oral solution 125 mg        125 mg Oral Daily 07/31/12 1310 08/22/12 0959   08/07/12 2200   vancomycin (VANCOCIN) 50 mg/mL oral solution 125 mg        125 mg Oral 2 times daily 07/31/12 1310 08/14/12 2159   08/01/12 1400   metroNIDAZOLE (FLAGYL) tablet 500 mg        500 mg Oral 3 times per day 08/01/12 1334 08/11/12 2359   07/31/12 1400   vancomycin (VANCOCIN) 50 mg/mL oral solution 125 mg        125 mg Oral 4 times daily 07/31/12 1310 08/07/12 1359   07/30/12 2000   ciprofloxacin (CIPRO) tablet 500 mg        500 mg Oral Every 24 hours 07/30/12 0853 08/11/12 2359   07/30/12 1000   metroNIDAZOLE (FLAGYL) tablet 500 mg  Status:  Discontinued        500 mg Oral Every 12 hours 07/30/12 0853 08/01/12 1324   07/28/12 0130   ciprofloxacin (CIPRO) IVPB 400 mg  Status:  Discontinued        400 mg 200 mL/hr over 60 Minutes Intravenous Every 24 hours 07/28/12 0035 07/30/12 0853   07/28/12 0100   metroNIDAZOLE (FLAGYL) IVPB 500 mg  Status:  Discontinued        500 mg 100 mL/hr over 60 Minutes Intravenous Every 8 hours 07/28/12 0033 07/30/12 0853           Assessment/Plan  Recurrent/persistent cdiff 1.  Improved today as of this note 2.  Already placed ID consult and discussed possibility for fecal transplant if not improved as an  outpatient 3.  Medicine and ID following 4.  Cont antibiotics 5.  Cont bland diet  Diverticulitis, pain resolved and CT improved 1.  No further surgical treatment necessary 2.  We can be contacted for further questions     LOS: 8 days    [name], [name] 08/04/2012, 10:21 AM Pager: [phone]

## 2012-08-05 ENCOUNTER — Inpatient Hospital Stay (HOSPITAL_COMMUNITY): Payer: Medicare Other

## 2012-08-05 LAB — CBC
HCT: 39.1 % (ref 36.0–46.0)
Hemoglobin: 11.5 g/dL — ABNORMAL LOW (ref 12.0–15.0)
MCHC: 29.4 g/dL — ABNORMAL LOW (ref 30.0–36.0)
RBC: 3.76 MIL/uL — ABNORMAL LOW (ref 3.87–5.11)

## 2012-08-05 LAB — RENAL FUNCTION PANEL
BUN: 26 mg/dL — ABNORMAL HIGH (ref 6–23)
CO2: 27 mEq/L (ref 19–32)
Chloride: 95 mEq/L — ABNORMAL LOW (ref 96–112)
GFR calc Af Amer: 10 mL/min — ABNORMAL LOW (ref >90)
Glucose, Bld: 93 mg/dL (ref 70–99)
Phosphorus: 6.4 mg/dL — ABNORMAL HIGH (ref 2.3–4.6)
Potassium: 4.1 mEq/L (ref 3.5–5.1)
Sodium: 134 mEq/L — ABNORMAL LOW (ref 135–145)

## 2012-08-05 LAB — GLUCOSE, CAPILLARY: Glucose-Capillary: 91 mg/dL (ref 70–99)

## 2012-08-05 MED ORDER — VANCOMYCIN 50 MG/ML ORAL SOLUTION: 125.0000 mg | Freq: Four times a day (QID) | ORAL | Status: DC

## 2012-08-05 MED ORDER — SACCHAROMYCES BOULARDII 250 MG PO CAPS: 250.0000 mg | ORAL_CAPSULE | Freq: Two times a day (BID) | ORAL | Status: DC

## 2012-08-05 NOTE — Progress Notes (Signed)
Pt. Got d/c instructions and prescriptions.pt. Ready to go home 

## 2012-08-05 NOTE — Procedures (Signed)
I was present at this dialysis session. I have reviewed the session itself and made appropriate changes.   Vinson Moselle, MD [employer] 08/05/2012, 10:56 AM

## 2012-08-05 NOTE — Progress Notes (Signed)
Subjective:  Seen on HD, no further abdominal pain, but eight loose stools yesterday (11 on day before).   Objective: Vital signs in last 24 hours: Temp:  [97.8 F (36.6 C)-98.1 F (36.7 C)] 98.1 F (36.7 C) (11/07 0735) Pulse Rate:  [69-88] 82  (11/07 0800) Resp:  [16-18] 16  (11/07 0800) BP: (119-174)/(43-81) 155/74 mmHg (11/07 0800) SpO2:  [97 %-99 %] 97 % (11/07 0741) Weight:  [77.9 kg (171 lb 11.8 oz)-79.4 kg (175 lb 0.7 oz)] 77.9 kg (171 lb 11.8 oz) (11/07 0735) Weight change: 2.2 kg (4 lb 13.6 oz)  Intake/Output from previous day: 11/06 0701 - 11/07 0700 In: 360 [P.O.:360] Out: 0    EXAM: General appearance:  Alert, in no apparent distress Resp:  CTA without rales, rhonchi, or wheezes Cardio:  RRR without murmur or rub GI:  + BS, soft and nontender Extremities:  No edema Access:  One line to AVF @ RUA (1st use), one line to left IJ catheter with BFR 250 cc/min  Lab Results:  [facility] 08/03/12 0649  WBC 12.8*  HGB 11.5*  HCT 39.2  PLT 215   BMET:  Basename 08/03/12 0649  NA 134*  K 4.3  CL 96  CO2 26  GLUCOSE 94  BUN 47*  CREATININE 6.18*  CALCIUM 9.6  ALBUMIN 2.9*   No results found for this basename: PTH:2 in the last 72 hours Iron Studies: No results found for this basename: IRON,TIBC,TRANSFERRIN,FERRITIN in the last 72 hours  Dialysis Orders: Center: [facility].  EDW 79.5 HD Bath 2K 2.25 Ca Time 4 Heparin tight. Access right upper AVF and left I-J 400/A 1.5 Zemplar none mcg IV/HD Epogen 28K - changed to 21K 10/28 Units IV/HD Venofer 50/week   Assessment/Plan: 1. Sigmoid diverticulitis/recurrent C diff - still with frequent loose stools, but improving. Now on cipro for abcess and po vanc for Cdif.  CT abd on 11/5 showed resolution of sigmoid tic abcess seen on prior 10/29 study.  2. ESRD - HD on TTS @ [place]; using AVF (1st time today with 1 line) and L IJ catheter, s/p AVF fistulogram on 07/07/12 with PTA of anastomotic stenosis and coil  embolization of large competing branch per Dr. Myra Gianotti. 3. HTN/Volume - BP currently 155/74, although recent drops during HD; wt 77.9 kg, EDW 79.5. UF goal of 1 L today as tolerated. 4. Anemia - Hgb 11.5 today, Fe sat 14% with ferritin 464; began Fe on 11/2, no Aranesp. 5. Metabolic bone disease - Ca 9.6 (10.7 corrected), P 6.4, iPTH 175, no Zemplar, on Phoslo 2 with meals. 6. Nutrition - Alb 2.9, diet advanced on 11/2. 7. DM - on SSI per primary. 8. ASCVD - on BB, O2 at home. 9. Hx renal cell cancer - followed by Urology at [facility]; MRI abdomen showed 2-cm cyst in lateral left kidney (benign), additional left renal cysts; s/p post right nephrectomy.    LOS: 9 days   LYLES,CHARLES 08/05/2012,8:24 AM  Patient seen and examined and agree with assessment and plan as above with additions as indicated.  Vinson Moselle  MD [facility] [phone] pgr    [phone] cell 08/05/2012, 10:59 AM

## 2012-08-17 ENCOUNTER — Telehealth (INDEPENDENT_AMBULATORY_CARE_PROVIDER_SITE_OTHER): Payer: Self-pay | Admitting: General Surgery

## 2012-08-17 NOTE — Telephone Encounter (Signed)
Pt urged to call by her "kidney doctor," to let Dr. Derrell Lolling know she is still having "bad diarrhea" and may need to proceed with the fecal transplant.

## 2012-08-17 NOTE — Telephone Encounter (Signed)
Per message from Dr. Derrell Lolling, pt instructed to call Dr. Aundria Mems (Infectious Disease) for the fecal implant.  Gave her the phone number to Dr. Feliz Beam office.

## 2012-08-19 ENCOUNTER — Emergency Department (HOSPITAL_COMMUNITY): Payer: Medicare Other

## 2012-08-19 ENCOUNTER — Encounter (HOSPITAL_COMMUNITY): Payer: Self-pay | Admitting: Nurse Practitioner

## 2012-08-19 ENCOUNTER — Inpatient Hospital Stay (HOSPITAL_COMMUNITY)
Admission: EM | Admit: 2012-08-19 | Discharge: 2012-08-21 | DRG: 189 | Disposition: A | Discharge status: Home Health | Payer: Medicare Other | Attending: Internal Medicine | Admitting: Internal Medicine

## 2012-08-19 DIAGNOSIS — A0472 Enterocolitis due to Clostridium difficile, not specified as recurrent: Secondary | ICD-10-CM | POA: Diagnosis present

## 2012-08-19 DIAGNOSIS — I6529 Occlusion and stenosis of unspecified carotid artery: Secondary | ICD-10-CM | POA: Diagnosis present

## 2012-08-19 DIAGNOSIS — Z9981 Dependence on supplemental oxygen: Secondary | ICD-10-CM

## 2012-08-19 DIAGNOSIS — J9620 Acute and chronic respiratory failure, unspecified whether with hypoxia or hypercapnia: Principal | ICD-10-CM | POA: Diagnosis present

## 2012-08-19 DIAGNOSIS — E669 Obesity, unspecified: Secondary | ICD-10-CM | POA: Diagnosis present

## 2012-08-19 DIAGNOSIS — Z87891 Personal history of nicotine dependence: Secondary | ICD-10-CM

## 2012-08-19 DIAGNOSIS — I2510 Atherosclerotic heart disease of native coronary artery without angina pectoris: Secondary | ICD-10-CM | POA: Diagnosis present

## 2012-08-19 DIAGNOSIS — R0602 Shortness of breath: Secondary | ICD-10-CM

## 2012-08-19 DIAGNOSIS — Z9861 Coronary angioplasty status: Secondary | ICD-10-CM

## 2012-08-19 DIAGNOSIS — Z905 Acquired absence of kidney: Secondary | ICD-10-CM

## 2012-08-19 DIAGNOSIS — D631 Anemia in chronic kidney disease: Secondary | ICD-10-CM | POA: Diagnosis present

## 2012-08-19 DIAGNOSIS — I252 Old myocardial infarction: Secondary | ICD-10-CM

## 2012-08-19 DIAGNOSIS — J449 Chronic obstructive pulmonary disease, unspecified: Secondary | ICD-10-CM | POA: Diagnosis present

## 2012-08-19 DIAGNOSIS — Z85528 Personal history of other malignant neoplasm of kidney: Secondary | ICD-10-CM

## 2012-08-19 DIAGNOSIS — I120 Hypertensive chronic kidney disease with stage 5 chronic kidney disease or end stage renal disease: Secondary | ICD-10-CM | POA: Diagnosis present

## 2012-08-19 DIAGNOSIS — Z992 Dependence on renal dialysis: Secondary | ICD-10-CM

## 2012-08-19 DIAGNOSIS — Z66 Do not resuscitate: Secondary | ICD-10-CM | POA: Diagnosis present

## 2012-08-19 DIAGNOSIS — J811 Chronic pulmonary edema: Secondary | ICD-10-CM

## 2012-08-19 DIAGNOSIS — I4891 Unspecified atrial fibrillation: Secondary | ICD-10-CM | POA: Diagnosis present

## 2012-08-19 DIAGNOSIS — E119 Type 2 diabetes mellitus without complications: Secondary | ICD-10-CM | POA: Diagnosis present

## 2012-08-19 DIAGNOSIS — N039 Chronic nephritic syndrome with unspecified morphologic changes: Secondary | ICD-10-CM | POA: Diagnosis present

## 2012-08-19 DIAGNOSIS — E875 Hyperkalemia: Secondary | ICD-10-CM | POA: Diagnosis present

## 2012-08-19 DIAGNOSIS — J4489 Other specified chronic obstructive pulmonary disease: Secondary | ICD-10-CM | POA: Diagnosis present

## 2012-08-19 DIAGNOSIS — D72829 Elevated white blood cell count, unspecified: Secondary | ICD-10-CM | POA: Diagnosis present

## 2012-08-19 DIAGNOSIS — N186 End stage renal disease: Secondary | ICD-10-CM | POA: Diagnosis present

## 2012-08-19 DIAGNOSIS — I2789 Other specified pulmonary heart diseases: Secondary | ICD-10-CM | POA: Diagnosis present

## 2012-08-19 DIAGNOSIS — N2581 Secondary hyperparathyroidism of renal origin: Secondary | ICD-10-CM | POA: Diagnosis present

## 2012-08-19 DIAGNOSIS — Z79899 Other long term (current) drug therapy: Secondary | ICD-10-CM

## 2012-08-19 DIAGNOSIS — I509 Heart failure, unspecified: Secondary | ICD-10-CM | POA: Diagnosis present

## 2012-08-19 HISTORY — DX: Pure hypercholesterolemia, unspecified: E78.00

## 2012-08-19 HISTORY — DX: Old myocardial infarction: I25.2

## 2012-08-19 HISTORY — DX: Unspecified convulsions: R56.9

## 2012-08-19 HISTORY — DX: Secondary hyperparathyroidism of renal origin: N25.81

## 2012-08-19 HISTORY — DX: Dependence on renal dialysis: N18.6

## 2012-08-19 HISTORY — DX: Diverticulitis of intestine, part unspecified, without perforation or abscess without bleeding: K57.92

## 2012-08-19 HISTORY — DX: Dependence on supplemental oxygen: Z99.81

## 2012-08-19 HISTORY — DX: Personal history of pneumonia (recurrent): Z87.01

## 2012-08-19 HISTORY — DX: Gastro-esophageal reflux disease without esophagitis: K21.9

## 2012-08-19 HISTORY — DX: Iron deficiency anemia, unspecified: D50.9

## 2012-08-19 HISTORY — DX: Sleep apnea, unspecified: G47.30

## 2012-08-19 HISTORY — DX: Type 2 diabetes mellitus without complications: E11.9

## 2012-08-19 HISTORY — DX: Personal history of other medical treatment: Z92.89

## 2012-08-19 HISTORY — DX: Dependence on renal dialysis: Z99.2

## 2012-08-19 LAB — PRO B NATRIURETIC PEPTIDE: Pro B Natriuretic peptide (BNP): 10460 pg/mL — ABNORMAL HIGH (ref 0–125)

## 2012-08-19 LAB — CBC WITH DIFFERENTIAL/PLATELET
Basophils Absolute: 0 10*3/uL (ref 0.0–0.1)
HCT: 39.1 % (ref 36.0–46.0)
Lymphocytes Relative: 3 % — ABNORMAL LOW (ref 12–46)
Neutro Abs: 15.3 10*3/uL — ABNORMAL HIGH (ref 1.7–7.7)
Neutrophils Relative %: 90 % — ABNORMAL HIGH (ref 43–77)
Platelets: 157 10*3/uL (ref 150–400)
RDW: 18.4 % — ABNORMAL HIGH (ref 11.5–15.5)
WBC: 17 10*3/uL — ABNORMAL HIGH (ref 4.0–10.5)

## 2012-08-19 LAB — COMPREHENSIVE METABOLIC PANEL
ALT: 14 U/L (ref 0–35)
AST: 15 U/L (ref 0–37)
CO2: 22 mEq/L (ref 19–32)
Chloride: 94 mEq/L — ABNORMAL LOW (ref 96–112)
GFR calc non Af Amer: 5 mL/min — ABNORMAL LOW (ref >90)
Potassium: 5.6 mEq/L — ABNORMAL HIGH (ref 3.5–5.1)
Sodium: 133 mEq/L — ABNORMAL LOW (ref 135–145)
Total Bilirubin: 0.3 mg/dL (ref 0.3–1.2)

## 2012-08-19 LAB — URINALYSIS, ROUTINE W REFLEX MICROSCOPIC
Glucose, UA: 100 mg/dL — AB
Ketones, ur: NEGATIVE mg/dL
Protein, ur: >300 mg/dL — AB
pH: 6 (ref 5.0–8.0)

## 2012-08-19 LAB — URINE MICROSCOPIC-ADD ON

## 2012-08-19 MED ORDER — ONDANSETRON 4 MG PO TBDP
ORAL_TABLET | ORAL | Status: AC
  Filled 2012-08-19: qty 2

## 2012-08-19 MED ORDER — FUROSEMIDE 10 MG/ML IJ SOLN
40.0000 mg | Freq: Once | INTRAMUSCULAR | Status: AC
  Administered 2012-08-19: 40 mg via INTRAVENOUS
  Filled 2012-08-19: qty 4

## 2012-08-19 NOTE — ED Notes (Signed)
IV team paged to see if they can access pt's dialysis access in left chest

## 2012-08-19 NOTE — H&P (Signed)
PCP:   [name], MD   Chief Complaint:  sob  HPI: 71 yo female esrd normally dialysis on t/r/sat however she went to [place] last week and was arranged to get dialysis this past Monday which she did.  On Monday after dialysis, she was told she could wait til Thursday to get her next dialysis however earlier today she started to get more sob.  Acute onset.  No fever/n/v/d.  No cp.  No abd pain.  No le edema.  Briefly was on bipap and given iv lasix and is feeling better.  Chronically on 3 l oxygen at home for also copd.  Review of Systems:  O/w neg'  Past Medical History: Past Medical History  Diagnosis Date  . Carotid artery occlusion   . ESRD (end stage renal disease)   . C. difficile diarrhea   . Cancer     Renal Cell Carcinoma  . Hypertension   . Diabetes mellitus     Type II  . ASCVD (arteriosclerotic cardiovascular disease)   . Myocardial infarction   . PCI (pneumatosis cystoides intestinalis)   . Atrial fibrillation April  2013  . CHF (congestive heart failure)   . Pulmonary hypertension   . CKD (chronic kidney disease)   . Anemia   . Increased PTH level   . COPD (chronic obstructive pulmonary disease)   . Shortness of breath    Past Surgical History  Procedure Date  . Nephrectomy     Right, for Renal cell carcinoma  . Carotid endarterectomy   . Av fistula placement Jan. 7, 2011    Right  upper arm by Dr. Hart Rochester  . Hd catheter Aug. 22, 2013    [facility]  . Abdominal hysterectomy   . Appendectomy     Medications: Prior to Admission medications   Medication Sig Start Date End Date Taking? Authorizing Provider  albuterol (PROVENTIL) (2.5 MG/3ML) 0.083% nebulizer solution Take 2.5 mg by nebulization every 6 (six) hours as needed. For shortness of breath   Yes Historical Provider, MD  calcium carbonate 200 MG capsule Take 200 mg by mouth 3 (three) times daily with meals.    Yes Historical Provider, MD  carvedilol (COREG) 25 MG tablet Take 25 mg by mouth  daily.   Yes Historical Provider, MD  fluticasone (FLOVENT HFA) 110 MCG/ACT inhaler Inhale 1 puff into the lungs 2 (two) times daily.   Yes Historical Provider, MD  furosemide (LASIX) 80 MG tablet Take 80 mg by mouth daily.  05/29/12  Yes Historical Provider, MD  Probiotic Product (ALIGN PO) Take 1 capsule by mouth daily.   Yes Historical Provider, MD  rOPINIRole (REQUIP) 1 MG tablet Take 1 mg by mouth 3 (three) times daily.   Yes Historical Provider, MD  vancomycin (VANCOCIN) 50 mg/mL oral solution Take 2.5 mLs (125 mg total) by mouth 4 (four) times daily. For 7 days then 125mg  TID for 14days then 125mg  BID for 14 days then 125mg  Daily for 14days then 125mg  every other day for 14days then stop 08/05/12  Yes Zannie Cove, MD    Allergies:   Allergies  Allergen Reactions  . Lipitor (Atorvastatin) Other (See Comments)    Causes drowsiness  . Morphine And Related Nausea And Vomiting    Social History:  reports that she quit smoking about 8 years ago. Her smoking use included Cigarettes. She quit after 50 years of use. She has never used smokeless tobacco. She reports that she does not drink alcohol or use illicit  drugs.  Family History: Family History  Problem Relation Age of Onset  . Cancer Mother     pancreatic  . Stroke Father   . Coronary artery disease Father   . Cancer Brother     Brain  . Kidney disease Brother     Kidney stones    Physical Exam: Filed Vitals:   08/19/12 1855 08/19/12 1925  BP: 142/102   Temp: 98.7 F (37.1 C)   TempSrc: Oral   Resp: 18   SpO2: 96% 92%   General appearance: alert, cooperative and no distress Neck: no JVD and supple, symmetrical, trachea midline Lungs: rales bibasilar no w/rhonchi or crackles  Good air movement Heart: regular rate and rhythm, S1, S2 normal, no murmur, click, rub or gallop Abdomen: soft, non-tender; bowel sounds normal; no masses,  no organomegaly Extremities: extremities normal, atraumatic, no cyanosis or  edema Pulses: 2+ and symmetric Skin: Skin color, texture, turgor normal. No rashes or lesions Neurologic: Grossly normal  Labs on Admission:   [facility] 08/19/12 2018  NA 133*  K 5.6*  CL 94*  CO2 22  GLUCOSE 170*  BUN 77*  CREATININE 8.00*  CALCIUM 8.6  MG --  PHOS --    Basename 08/19/12 2018  AST 15  ALT 14  ALKPHOS 90  BILITOT 0.3  PROT 6.4  ALBUMIN 2.6*    Basename 08/19/12 2018  WBC 17.0*  NEUTROABS 15.3*  HGB 11.5*  HCT 39.1  MCV 105.4*  PLT 157    Radiological Exams on Admission:  Dg Chest Portable 1 View  08/19/2012  *RADIOLOGY REPORT*  Clinical Data: Worsening shortness of breath, CHF, productive cough  PORTABLE CHEST - 1 VIEW  Comparison: 08/19/2012; 01/16/2012; 01/11/2012  Findings:  Grossly unchanged cardiac silhouette and mediastinal contours with atherosclerotic calcifications of the aortic arch.  Unchanged prominence of the central pulmonary vasculature.  Stable position of support apparatus.  The pulmonary vasculature is less distinct on the present examination with cephalization of flow.  Grossly unchanged bibasilar opacities.  No new focal airspace opacities. No definite pleural effusion or pneumothorax.  Unchanged bones.  IMPRESSION: 1.  Overall findings compatible with volume overload/pulmonary edema. 2.  Unchanged enlarged cardiac silhouette and prominence of the central pulmonary vasculature, nonspecific but may be seen in setting of pulmonary arterial hypertension.  Further evaluation with cardiac echo may be performed as clinically indicated.   Original Report Authenticated By: Tacey Ruiz, MD     Assessment/Plan 70 female a on c resp failure seconday to fluid overload, need of dialysis  Principal Problem:  *Acute-on-chronic respiratory failure Active Problems:  End stage renal disease  COPD (chronic obstructive pulmonary disease)  Oxygen dependent  Leukocytosis  Pt close back to her baseline.  Can speak in full sentences, mentation  normal.  obs for dialysis in am.  No emergent need as of yet.  nephro consulted.  Left message for dialysis unit on their voice machine.  Wbc elevated, do not think any infectious issue, repeat in am.   Arnulfo Batson A 08/19/2012, 10:09 PM

## 2012-08-19 NOTE — ED Notes (Signed)
Attempted to call report. Floor RN unable to accept report.  

## 2012-08-19 NOTE — ED Notes (Addendum)
PER EMS. Pt. O2 sats 70%/3L pt put on NRB. Put on CPAP @ 100%. Pt started vomiting.  BP 220/100 (initial) BP 164 palpations Hx HTN, CHF, COPD, RF, Hyperliidemia and diabetic. Pt. Missed dialysis today stating she didn't feel well  Pt tried x3 albuterol treatments x1 by EMS then pt became lethargic and was put on CPAP

## 2012-08-19 NOTE — ED Notes (Addendum)
Two unsuccessful attempts for IV access. IV team paged for IV access. Lab at bedside attempting to obtain lab work

## 2012-08-19 NOTE — ED Provider Notes (Signed)
History     CSN: [id number]  Arrival date & time 08/19/12  1842   First MD Initiated Contact with Patient 08/19/12 1849      Chief Complaint  Patient presents with  . CPAP     (Consider location/radiation/quality/duration/timing/severity/associated sxs/prior treatment) HPI: Ms. Seacat is a 71 yo CF with history of CHF, ESRD, CAD, c, difficile, DM, and COPD who presents with worsening SOB, decreased exercise intolerance and cough. For the last two days she has felt more fatigued and short of breath. These symptoms have progressively worsened. She last underwent HD 4 days ago. Today she began to cough up brown sputum. Has also had chills today but not fever. Seen by her PCP today and evaluated with CXR however, no results are available. Her physician recommended admission but she declined. She returned home and symptoms worsened further, her daughter saw her and called for EMS. She is currently undergoing treatment for c. Diff with oral vancomycin with little improvement of her abdominal pain or diarrhea. Her abdominal pain is stable. She denies chest pain, leg swelling, syncopal symptoms or dysuria. Does endorse worsening orthopnea today.   Past Medical History  Diagnosis Date  . Carotid artery occlusion   . ESRD (end stage renal disease)   . C. difficile diarrhea   . Cancer     Renal Cell Carcinoma  . Hypertension   . Diabetes mellitus     Type II  . ASCVD (arteriosclerotic cardiovascular disease)   . Myocardial infarction   . PCI (pneumatosis cystoides intestinalis)   . Atrial fibrillation April  2013  . CHF (congestive heart failure)   . Pulmonary hypertension   . CKD (chronic kidney disease)   . Anemia   . Increased PTH level   . COPD (chronic obstructive pulmonary disease)   . Shortness of breath     Past Surgical History  Procedure Date  . Nephrectomy     Right, for Renal cell carcinoma  . Carotid endarterectomy   . Av fistula placement Jan. 7, 2011    Right  upper  arm by Dr. Hart Rochester  . Hd catheter Aug. 22, 2013    [facility]  . Abdominal hysterectomy   . Appendectomy     Family History  Problem Relation Age of Onset  . Cancer Mother     pancreatic  . Stroke Father   . Coronary artery disease Father   . Cancer Brother     Brain  . Kidney disease Brother     Kidney stones    History  Substance Use Topics  . Smoking status: Former Smoker -- 50 years    Types: Cigarettes    Quit date: 09/30/2003  . Smokeless tobacco: Never Used  . Alcohol Use: No    OB History    Grav Para Term Preterm Abortions TAB SAB Ect Mult Living                 Review of Systems  Constitutional: Positive for appetite change (decreased today) and fatigue. Negative for fever and chills.  HENT: Negative for congestion and rhinorrhea.   Eyes: Negative for photophobia and visual disturbance.  Respiratory: Positive for cough and shortness of breath.        Positive for orthopnea   Cardiovascular: Negative for chest pain and leg swelling.  Gastrointestinal: Negative for nausea, vomiting and abdominal pain.  Genitourinary: Negative for dysuria and decreased urine volume.  Musculoskeletal: Negative for myalgias and arthralgias.  Skin: Negative for pallor and  rash.  Neurological: Negative for dizziness and headaches.  Psychiatric/Behavioral: Negative for confusion and agitation.  All other systems reviewed and are negative.   Allergies  Lipitor and Morphine and related  Home Medications   Current Outpatient Rx  Name  Route  Sig  Dispense  Refill  . ALBUTEROL SULFATE (2.5 MG/3ML) 0.083% IN NEBU   Nebulization   Take 2.5 mg by nebulization every 6 (six) hours as needed. For shortness of breath         . CALCIUM CARBONATE 200 MG PO CAPS   Oral   Take 200 mg by mouth 2 (two) times daily with a meal.          . CARVEDILOL 25 MG PO TABS   Oral   Take 25 mg by mouth daily.         Marland Kitchen HEMOCYTE PLUS PO   Oral   Take 1 tablet by mouth daily.           Marland Kitchen FLUTICASONE PROPIONATE  HFA 110 MCG/ACT IN AERO   Inhalation   Inhale 1 puff into the lungs 2 (two) times daily.         . FUROSEMIDE 80 MG PO TABS               . ROPINIROLE HCL 1 MG PO TABS   Oral   Take 1 mg by mouth 3 (three) times daily.         Marland Kitchen SACCHAROMYCES BOULARDII 250 MG PO CAPS   Oral   Take 1 capsule (250 mg total) by mouth 2 (two) times daily.   30 capsule   0   . VANCOMYCIN 50 MG/ML ORAL SOLUTION   Oral   Take 2.5 mLs (125 mg total) by mouth 4 (four) times daily. For 7 days then 125mg  TID for 14days then 125mg  BID for 14 days then 125mg  Daily for 14days then 125mg  every other day for 14days then stop   25 mL   5     BP 139/67  Pulse 108  Temp 98.3 F (36.8 C) (Oral)  Resp 22  Wt 179 lb 14.4 oz (81.602 kg)  SpO2 93%  Physical Exam  Nursing note and vitals reviewed. Constitutional: She is oriented to person, place, and time.       Obese female, mild respiratory distress   HENT:  Head: Normocephalic and atraumatic.  Eyes: EOM are normal. Pupils are equal, round, and reactive to light.  Neck: Normal range of motion. Neck supple. JVD present.  Cardiovascular: Regular rhythm and normal heart sounds.  Tachycardia present.   Pulmonary/Chest: Tachypnea noted. She is in respiratory distress (mild). She has wheezes (expiratory, diffusely). She has rales (diffusely).  Abdominal: Soft. Bowel sounds are normal. There is no tenderness. There is no rebound.  Musculoskeletal: Normal range of motion. She exhibits no edema.  Neurological: She is alert and oriented to person, place, and time.  Skin: Skin is warm and dry.   ED Course  Procedures (including critical care time)  Labs Reviewed  CBC WITH DIFFERENTIAL - Abnormal; Notable for the following:    WBC 17.0 (*)     RBC 3.71 (*)     Hemoglobin 11.5 (*)     MCV 105.4 (*)     MCHC 29.4 (*)     RDW 18.4 (*)     Neutrophils Relative 90 (*)     Neutro Abs 15.3 (*)     Lymphocytes Relative 3  (*)  Lymphs Abs 0.5 (*)     All other components within normal limits  COMPREHENSIVE METABOLIC PANEL - Abnormal; Notable for the following:    Sodium 133 (*)     Potassium 5.6 (*)     Chloride 94 (*)     Glucose, Bld 170 (*)     BUN 77 (*)     Creatinine, Ser 8.00 (*)     Albumin 2.6 (*)     GFR calc non Af Amer 5 (*)     GFR calc Af Amer 5 (*)     All other components within normal limits  URINALYSIS, ROUTINE W REFLEX MICROSCOPIC - Abnormal; Notable for the following:    APPearance CLOUDY (*)     Glucose, UA 100 (*)     Hgb urine dipstick SMALL (*)     Protein, ur >300 (*)     Leukocytes, UA SMALL (*)     All other components within normal limits  PRO B NATRIURETIC PEPTIDE - Abnormal; Notable for the following:    Pro B Natriuretic peptide (BNP) 10460.0 (*)     All other components within normal limits  URINE MICROSCOPIC-ADD ON - Abnormal; Notable for the following:    Squamous Epithelial / LPF MANY (*)     Bacteria, UA FEW (*)     Casts HYALINE CASTS (*)  GRANULAR CAST   All other components within normal limits  GLUCOSE, CAPILLARY - Abnormal; Notable for the following:    Glucose-Capillary 123 (*)     All other components within normal limits  LACTIC ACID, PLASMA  URINE CULTURE  BASIC METABOLIC PANEL  CBC   Dg Chest Portable 1 View  08/19/2012  *RADIOLOGY REPORT*  Clinical Data: Worsening shortness of breath, CHF, productive cough  PORTABLE CHEST - 1 VIEW  Comparison: 08/19/2012; 01/16/2012; 01/11/2012  Findings:  Grossly unchanged cardiac silhouette and mediastinal contours with atherosclerotic calcifications of the aortic arch.  Unchanged prominence of the central pulmonary vasculature.  Stable position of support apparatus.  The pulmonary vasculature is less distinct on the present examination with cephalization of flow.  Grossly unchanged bibasilar opacities.  No new focal airspace opacities. No definite pleural effusion or pneumothorax.  Unchanged bones.  IMPRESSION:  1.  Overall findings compatible with volume overload/pulmonary edema. 2.  Unchanged enlarged cardiac silhouette and prominence of the central pulmonary vasculature, nonspecific but may be seen in setting of pulmonary arterial hypertension.  Further evaluation with cardiac echo may be performed as clinically indicated.   Original Report Authenticated By: Tacey Ruiz, MD    1. Shortness of breath   2. Acute-on-chronic respiratory failure   3. COPD (chronic obstructive pulmonary disease)   4. End stage renal disease   5. Oxygen dependent    MDM   71 yo CF with history of CHF, ESRD, CAD, c, difficile, DM, and COPD who presents with worsening SOB, decreased exercise intolerance and cough. On CPAP by EMS, but removed shortly after arrival. Afebrile, mild tachycardia, normotensive, O2 sats 95% on 3L O2. Symptoms likely volume overload as she has not been to dialysis in 4 days. Patient also with history of CHF so this is possible as well. CXR showed worsening pulmonary edema but no definite consolidation. Doubt PNA as no consolidation, afebrile. Labs revealed K 5.6, Cr 8, BUN 77, glucose 177, WBC 17, Hgb 11.5. EKG without peaked t waves. Urine not c/w infection. Lactic acid 1.2. Doubt sepsis as not obvious source of infection identified, lactic normal. Discussed with  Nephrology, they will see in the morning as no acute indication for dialysis exists currently. Patient work of breathing improved while in ED, she remained HD stable, with adequate oxygenation. Given 40 mg IV lasix with no urine output. Patient admitted to Hospitalist service for further management. Will likely need dialysis tomorrow. Discussed w/u and plan for admission with patient she in agreement with plan.  Reviewed imaging, labs and previous medical records, utilized in MDM  Discussed case with Dr. Jeraldine Loots.   Clinical Impression 1. Acute on chronic respiratory failure 2. Volume overload 3. Hyperkalemia.         Margie Billet,  MD 08/20/12 [phone]

## 2012-08-19 NOTE — ED Notes (Signed)
Pt ambulated with family member to bathroom. Pt has steady gait and no respiratory distress noted

## 2012-08-20 ENCOUNTER — Encounter (HOSPITAL_COMMUNITY): Payer: Self-pay | Admitting: General Practice

## 2012-08-20 DIAGNOSIS — A0472 Enterocolitis due to Clostridium difficile, not specified as recurrent: Secondary | ICD-10-CM

## 2012-08-20 LAB — BASIC METABOLIC PANEL
CO2: 21 mEq/L (ref 19–32)
Calcium: 8.9 mg/dL (ref 8.4–10.5)
Chloride: 95 mEq/L — ABNORMAL LOW (ref 96–112)
Glucose, Bld: 115 mg/dL — ABNORMAL HIGH (ref 70–99)
Sodium: 135 mEq/L (ref 135–145)

## 2012-08-20 LAB — CBC
HCT: 40.3 % (ref 36.0–46.0)
MCH: 31.2 pg (ref 26.0–34.0)
MCV: 105.8 fL — ABNORMAL HIGH (ref 78.0–100.0)
Platelets: 157 10*3/uL (ref 150–400)
RBC: 3.81 MIL/uL — ABNORMAL LOW (ref 3.87–5.11)

## 2012-08-20 MED ORDER — FLUTICASONE PROPIONATE HFA 110 MCG/ACT IN AERO
1.0000 | INHALATION_SPRAY | Freq: Two times a day (BID) | RESPIRATORY_TRACT | Status: DC
  Administered 2012-08-21: 1 via RESPIRATORY_TRACT
  Filled 2012-08-20 (×2): qty 12

## 2012-08-20 MED ORDER — ONDANSETRON HCL 4 MG/2ML IJ SOLN: 4.0000 mg | Freq: Four times a day (QID) | INTRAMUSCULAR | Status: DC | PRN

## 2012-08-20 MED ORDER — CARVEDILOL 25 MG PO TABS: 25.0000 mg | ORAL_TABLET | Freq: Once | ORAL | Status: DC

## 2012-08-20 MED ORDER — OXYCODONE HCL 5 MG PO TABS: 5.0000 mg | ORAL_TABLET | ORAL | Status: DC | PRN

## 2012-08-20 MED ORDER — CARVEDILOL 25 MG PO TABS
25.0000 mg | ORAL_TABLET | Freq: Once | ORAL | Status: AC
  Administered 2012-08-20: 25 mg via ORAL
  Filled 2012-08-20: qty 1

## 2012-08-20 MED ORDER — ACETAMINOPHEN 325 MG PO TABS
650.0000 mg | ORAL_TABLET | Freq: Four times a day (QID) | ORAL | Status: DC | PRN
  Administered 2012-08-21: 650 mg via ORAL

## 2012-08-20 MED ORDER — HEPARIN SODIUM (PORCINE) 1000 UNIT/ML DIALYSIS: 1000.0000 [IU] | INTRAMUSCULAR | Status: DC | PRN

## 2012-08-20 MED ORDER — PENTAFLUOROPROP-TETRAFLUOROETH EX AERO: 1.0000 "application " | INHALATION_SPRAY | CUTANEOUS | Status: DC | PRN

## 2012-08-20 MED ORDER — ALTEPLASE 2 MG IJ SOLR
2.0000 mg | Freq: Once | INTRAMUSCULAR | Status: DC | PRN
  Filled 2012-08-20: qty 2

## 2012-08-20 MED ORDER — ACETAMINOPHEN 325 MG PO TABS
ORAL_TABLET | ORAL | Status: AC
  Administered 2012-08-20: 650 mg
  Filled 2012-08-20: qty 2

## 2012-08-20 MED ORDER — HEPARIN SODIUM (PORCINE) 1000 UNIT/ML DIALYSIS
1600.0000 [IU] | INTRAMUSCULAR | Status: DC | PRN
  Administered 2012-08-20: 1600 [IU] via INTRAVENOUS_CENTRAL

## 2012-08-20 MED ORDER — CARVEDILOL 25 MG PO TABS: 25.0000 mg | ORAL_TABLET | Freq: Every day | ORAL | Status: DC

## 2012-08-20 MED ORDER — ONDANSETRON HCL 4 MG/2ML IJ SOLN
INTRAMUSCULAR | Status: AC
  Filled 2012-08-20: qty 2

## 2012-08-20 MED ORDER — SODIUM CHLORIDE 0.9 % IJ SOLN: 3.0000 mL | INTRAMUSCULAR | Status: DC | PRN

## 2012-08-20 MED ORDER — ROPINIROLE HCL 1 MG PO TABS
1.0000 mg | ORAL_TABLET | Freq: Three times a day (TID) | ORAL | Status: DC
  Administered 2012-08-20 – 2012-08-21 (×5): 1 mg via ORAL
  Filled 2012-08-20 (×6): qty 1

## 2012-08-20 MED ORDER — VANCOMYCIN 50 MG/ML ORAL SOLUTION
125.0000 mg | Freq: Four times a day (QID) | ORAL | Status: DC
  Administered 2012-08-20 – 2012-08-21 (×6): 125 mg via ORAL
  Filled 2012-08-20 (×8): qty 2.5

## 2012-08-20 MED ORDER — NEPRO/CARBSTEADY PO LIQD
237.0000 mL | ORAL | Status: DC | PRN
  Filled 2012-08-20: qty 237

## 2012-08-20 MED ORDER — SODIUM CHLORIDE 0.9 % IV SOLN: 100.0000 mL | INTRAVENOUS | Status: DC | PRN

## 2012-08-20 MED ORDER — LIDOCAINE-PRILOCAINE 2.5-2.5 % EX CREA
1.0000 "application " | TOPICAL_CREAM | CUTANEOUS | Status: DC | PRN
  Filled 2012-08-20: qty 5

## 2012-08-20 MED ORDER — SODIUM CHLORIDE 0.9 % IV SOLN: 250.0000 mL | INTRAVENOUS | Status: DC | PRN

## 2012-08-20 MED ORDER — SODIUM CHLORIDE 0.9 % IJ SOLN
3.0000 mL | Freq: Two times a day (BID) | INTRAMUSCULAR | Status: DC
  Administered 2012-08-20: 3 mL via INTRAVENOUS

## 2012-08-20 MED ORDER — FUROSEMIDE 80 MG PO TABS
80.0000 mg | ORAL_TABLET | Freq: Every day | ORAL | Status: DC
  Administered 2012-08-20 – 2012-08-21 (×2): 80 mg via ORAL
  Filled 2012-08-20 (×2): qty 1

## 2012-08-20 MED ORDER — CARVEDILOL 25 MG PO TABS
25.0000 mg | ORAL_TABLET | Freq: Every day | ORAL | Status: DC
  Filled 2012-08-20: qty 1

## 2012-08-20 MED ORDER — LIDOCAINE HCL (PF) 1 % IJ SOLN: 5.0000 mL | INTRAMUSCULAR | Status: DC | PRN

## 2012-08-20 MED ORDER — SODIUM CHLORIDE 0.9 % IJ SOLN
3.0000 mL | Freq: Two times a day (BID) | INTRAMUSCULAR | Status: DC
  Administered 2012-08-20 (×2): 3 mL via INTRAVENOUS

## 2012-08-20 NOTE — Progress Notes (Signed)
Nutrition Brief Note  Patient identified on the Malnutrition Screening Tool (MST) Report. Upon further chart review, current intake of meals is adequate and weight has been consistent within the past 2 months.  BMI is 31. Pt meets criteria for Obese Class I based on current BMI.   Current diet order is Heart Healthy, patient is consuming approximately 50 - 75% of meals at this time. Labs and medications reviewed.   No nutrition interventions warranted at this time. If nutrition issues arise, please consult RD.   Jarold Motto MS, RD, LDN Pager: [phone] After-hours pager: [phone]

## 2012-08-20 NOTE — Procedures (Signed)
I was present at this dialysis session. I have reviewed the session itself and made appropriate changes.   Rob Newell Frater, MD [facility] 08/20/2012, 9:59 AM   

## 2012-08-20 NOTE — Consult Note (Addendum)
[name] 08/20/2012 Antwann Preziosi D Requesting Physician:  Dr. Rito Ehrlich  Reason for Consult:  SOB and pulm edema , esrd pt HPI: The patient is a 71 y.o. 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 Presented ED last night with SOB and SaO2 70% on 3L [place] (usual home O2). Improved with CPAP. CXR showed pulm edema, mild-moderate. No CP or fevers, +cough. Hx PNA "many times".   ROS  no fever  no HA, visual chg  no focal weakness or confusion  no abd pain, +diarrhea, on po vanc taper for cdif  no rash or itching   Past Medical History:  Past Medical History  Diagnosis Date  . Carotid artery occlusion     bilat CEA in 2012?  . C. 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 Hypertension   . Atrial fibrillation April  2013  . Pulmonary hypertension   . Secondary hyperparathyroidism, renal   . COPD (chronic obstructive pulmonary disease)   . Hypercholesterolemia   . History of MI (myocardial infarction)     "they say I had 2 years ago" (08/20/2012)  . GERD (gastroesophageal reflux disease)   . History of pneumonia     "have it alot" (08/20/2012)  . Sleep apnea     "don't wear mask anymore"  . Type II diabetes mellitus   . History of blood transfusion 2013    "3 times so far in 2013:  4 pints one time; 2 pints another; ?# last time" (08/20/2012)  . Iron deficiency anemia   . Seizures 2012    after carotid surgery  . ESRD on dialysis     "[place]; Tues, Thurs; Sat"  . Renal cell carcinoma 2005?  Marland Kitchen On home oxygen therapy     "24/7"   . Acute diverticulitis     Early Nov 2013, treated medically    Past Surgical History:  Past Surgical History  Procedure Date  . Nephrectomy 2005    Right, for Renal cell carcinoma  . Carotid endarterectomy 2012    bilaterally  . Av fistula placement Jan. 7, 2011    Right  upper arm by Dr. Hart [place]  . Hd catheter Aug. 22, 2013    [facility]  . Appendectomy     childhood  . Abdominal hysterectomy 1971?  Marland Kitchen Coronary angioplasty with stent placement 1990's    "1 total"  . Coronary angioplasty 1990's  . Av fistula repair 2013    right upper arm    Family History:  Family History  Problem Relation Age of Onset  . Cancer Mother     pancreatic  . Stroke Father   . Coronary artery disease Father   . Cancer Brother     Brain  . Kidney disease Brother     Kidney stones   Social History:  reports that she quit smoking about 8 years ago. Her smoking use included Cigarettes. She has a 67.5 pack-year smoking history. She has never used smokeless tobacco. She reports that she does not drink alcohol or use illicit drugs.  Allergies:  Allergies  Allergen Reactions  . Morphine And Related Nausea And Vomiting  . Lipitor (Atorvastatin) Other (See Comments)    Causes drowsiness    Home medications: Prior to Admission medications   Medication Sig Start Date End Date Taking? Authorizing Provider  albuterol (PROVENTIL) (2.5 MG/3ML) 0.083% nebulizer solution  Take 2.5 mg by nebulization every 6 (six) hours as needed. For shortness of breath   Yes Historical Provider, MD  calcium carbonate 200 MG capsule Take 200 mg by mouth 3 (three) times daily with meals.    Yes Historical Provider, MD  carvedilol (COREG) 25 MG tablet Take 25 mg by mouth daily.   Yes Historical Provider, MD  fluticasone (FLOVENT HFA) 110 MCG/ACT inhaler Inhale 1 puff into the lungs 2 (two) times daily.   Yes Historical Provider, MD  furosemide (LASIX) 80 MG tablet Take 80 mg by mouth daily.  05/29/12  Yes Historical Provider, MD  Probiotic Product (ALIGN PO) Take 1 capsule by mouth daily.   Yes Historical Provider, MD  rOPINIRole (REQUIP) 1 MG tablet Take 1 mg by mouth 3 (three) times daily.   Yes Historical Provider, MD  vancomycin (VANCOCIN) 50 mg/mL oral solution Take 2.5 mLs (125 mg total) by mouth 4 (four) times daily. For 7 days then 125mg  TID for 14days  then 125mg  BID for 14 days then 125mg  Daily for 14days then 125mg  every other day for 14days then stop 08/05/12  Yes Zannie Cove, MD    Inpatient medications:    . carvedilol  25 mg Oral Daily  . fluticasone  1 puff Inhalation BID  . [COMPLETED] furosemide  40 mg Intravenous Once  . furosemide  80 mg Oral Daily  . rOPINIRole  1 mg Oral TID  . sodium chloride  3 mL Intravenous Q12H  . sodium chloride  3 mL Intravenous Q12H  . vancomycin  125 mg Oral QID     Labs: Basic Metabolic Panel:  Lab 08/20/12 1610 08/19/12 2018  NA 135 133*  K 4.7 5.6*  CL 95* 94*  CO2 21 22  GLUCOSE 115* 170*  BUN 79* 77*  CREATININE 8.06* 8.00*  ALB -- --  CALCIUM 8.9 8.6  PHOS -- --   Liver Function Tests:  Lab 08/19/12 2018  AST 15  ALT 14  ALKPHOS 90  BILITOT 0.3  PROT 6.4  ALBUMIN 2.6*   No results found for this basename: LIPASE:3,AMYLASE:3 in the last 168 hours No results found for this basename: AMMONIA:3 in the last 168 hours CBC:  Lab 08/20/12 0520 08/19/12 2018  WBC 13.6* 17.0*  NEUTROABS -- 15.3*  HGB 11.9* 11.5*  HCT 40.3 39.1  MCV 105.8* 105.4*  PLT 157 157   PT/INR: @labrcntip (inr:5) Cardiac Enzymes: No results found for this basename: CKTOTAL:5,CKMB:5,CKMBINDEX:5,TROPONINI:5 in the last 168 hours CBG:  Lab 08/20/12 0019  GLUCAP 123*    Iron Studies: No results found for this basename: IRON:30,TIBC:30,TRANSFERRIN:30,FERRITIN:30 in the last 168 hours  Xrays/Other Studies: Dg Chest Portable 1 View  08/19/2012  *RADIOLOGY REPORT*  Clinical Data: Worsening shortness of breath, CHF, productive cough  PORTABLE CHEST - 1 VIEW  Comparison: 08/19/2012; 01/16/2012; 01/11/2012  Findings:  Grossly unchanged cardiac silhouette and mediastinal contours with atherosclerotic calcifications of the aortic arch.  Unchanged prominence of the central pulmonary vasculature.  Stable position of support apparatus.  The pulmonary vasculature is less distinct on the present  examination with cephalization of flow.  Grossly unchanged bibasilar opacities.  No new focal airspace opacities. No definite pleural effusion or pneumothorax.  Unchanged bones.  IMPRESSION: 1.  Overall findings compatible with volume overload/pulmonary edema. 2.  Unchanged enlarged cardiac silhouette and prominence of the central pulmonary vasculature, nonspecific but may be seen in setting of pulmonary arterial hypertension.  Further evaluation with cardiac echo may be performed as clinically indicated.  Original Report Authenticated By: Tacey Ruiz, MD     Physical Exam:  Blood pressure 158/63, pulse 118, temperature 99 F (37.2 C), temperature source Oral, resp. rate 22, weight 81.602 kg (179 lb 14.4 oz), SpO2 92.00%.  Gen: alert, chronically ill adult female, on O2, not in distress Skin: no rash, cyanosis HEENT:  EOMI, sclera anicteric, throat clear Neck: + JVD, no bruits or LAN Chest: faint basilar crackles CV: regular, no rub or gallop, no murmur Abdomen: soft, nontender, obese, no mass or HSM Ext: no joint effusion or deformity, no gangrene or ulceration Neuro: alert, Ox3, no focal deficit, no asterixis  Outpatient HD: [facility] MWF, 4 hrs, EDW 78.5,, F160, 350/A1.5, 2K/2.25Ca bath, no profile. Hep 1600. EPO 21K. No Zemplar, Has TDC and AVF, just used AVF first time last HD with 16ga x 2   Impression/Plan 1. SOB due to pulm edema/vol excess in ESRD patient w COPD on home O2 at baseline >> HD today, pt on the machine, 4 kg over dry wt, try for 4-5 kg with HD today 2. Cdif assoc diarrhea- on po vanc at home, recurrent problem  3. Hx sigmoid diverticulitis Oct 2013, treated medically- stable 4. ESRD, usual hd is TTS. HD today and again Sat to keep on schedule 5. HTN/volume- takes Coreg only 25 once daily, BP 115/75 6. Anemia of CKD- hold ESA, see if pt will need to stay after HD or not 7. Secondary HPTH- no vit D, cont Tums ac as binder 8. Hx PAD/MI/bilat CEA 9. Hx COPD on home  02 10. DM 2 11. Hx afib, ? Paroxsymal- not on coumadin, on a BB, not on antiplatelets 12. EOL- multiple hospitalizations this year, pt is declining and is aware. Pt would consider DNR, she is not sure family will agree. Will try to set up family meeting this w/e.   Vinson Moselle  MD [facility] [phone] pgr    [phone] cell 08/20/2012, 7:57 AM

## 2012-08-20 NOTE — Progress Notes (Signed)
[facility] PROGRESS NOTE  [name] WUJ:[id number] DOB: 21-Jul-1941 DOA: 08/19/2012 PCP: [name], MD  Assessment/Plan: 1. Volume overload: Status post dialysis today. Appreciate nephrology help. We will reassess tomorrow to see if further dialysis needed 2. Nausea: Treat symptomatically 3. Chronic C. difficile: Continue by mouth vancomycin 4. Goals of care: Patient is opting for less aggressive treatment, but is concerned that the family wants to be more aggressive. I spoken with 2 of the patient's daughters who will talk to the other 2 and we will plan to meet with nephrology at 1 PM tomorrow  Code Status: Full code Family Communication: Plan discussed with patient and 2 of her 4 daughters at bedside Disposition Plan: Goals of care meeting planned for tomorrow at 1 PM   Consultants:  Vinson Moselle, nephrology  Procedures:  Status post dialysis today  Antibiotics:  On by mouth vancomycin for C. difficile, was already on his outpatient  HPI/Subjective: Patient seen after dialysis. Complains of 7/10 headache plus significant nausea. No chest pain. Shortness of breath still there but better.   Objective: Filed Vitals:   08/20/12 1205 08/20/12 1230 08/20/12 1256 08/20/12 1423  BP: 103/58 123/50 112/80 137/65  Pulse:  122 150 127  Temp:   98.6 F (37 C) 98.4 F (36.9 C)  TempSrc:   Oral Oral  Resp:  25 26 20   Weight:   80.3 kg (177 lb 0.5 oz)   SpO2:   100% 98%    Intake/Output Summary (Last 24 hours) at 08/20/12 1637 Last data filed at 08/20/12 1256  Gross per 24 hour  Intake    240 ml  Output   2500 ml  Net  -2260 ml   Filed Weights   08/20/12 0028 08/20/12 0830 08/20/12 1256  Weight: 81.602 kg (179 lb 14.4 oz) 82 kg (180 lb 12.4 oz) 80.3 kg (177 lb 0.5 oz)    Exam:   General:  Alert and oriented x3, fatigue, moderate distress secondary to nausea and headache  Cardiovascular: Regular rate and rhythm, S1-S2, 2/6 systolic ejection  murmur  Respiratory: Decreased breath sounds throughout  Abdomen: Soft, obese, nontender, hypoactive bowel sounds  Extremities: No clubbing or cyanosis, trace pitting edema  Data Reviewed: Basic Metabolic Panel:  Lab 08/20/12 9562 08/19/12 2018  NA 135 133*  K 4.7 5.6*  CL 95* 94*  CO2 21 22  GLUCOSE 115* 170*  BUN 79* 77*  CREATININE 8.06* 8.00*  CALCIUM 8.9 8.6  MG -- --  PHOS -- --   Liver Function Tests:  Lab 08/19/12 2018  AST 15  ALT 14  ALKPHOS 90  BILITOT 0.3  PROT 6.4  ALBUMIN 2.6*   CBC:  Lab 08/20/12 0520 08/19/12 2018  WBC 13.6* 17.0*  NEUTROABS -- 15.3*  HGB 11.9* 11.5*  HCT 40.3 39.1  MCV 105.8* 105.4*  PLT 157 157   BNP (last 3 results)  Basename 08/19/12 2020  PROBNP 10460.0*   CBG:  Lab 08/20/12 0019  GLUCAP 123*      Studies: Dg Chest Portable 1 View  08/19/2012 IMPRESSION: 1.  Overall findings compatible with volume overload/pulmonary edema. 2.  Unchanged enlarged cardiac silhouette and prominence of the central pulmonary vasculature, nonspecific but may be seen in setting of pulmonary arterial hypertension.  Further evaluation with cardiac echo may be performed as clinically indicated.   Original Report Authenticated By: Tacey Ruiz, MD     Scheduled Meds:   . [COMPLETED] acetaminophen      . carvedilol  25 mg Oral QHS  . [COMPLETED] carvedilol  25 mg Oral Once  . fluticasone  1 puff Inhalation BID  . [COMPLETED] furosemide  40 mg Intravenous Once  . furosemide  80 mg Oral Daily  . ondansetron      . rOPINIRole  1 mg Oral TID  . sodium chloride  3 mL Intravenous Q12H  . sodium chloride  3 mL Intravenous Q12H  . vancomycin  125 mg Oral QID  . [DISCONTINUED] carvedilol  25 mg Oral Daily  . [DISCONTINUED] carvedilol  25 mg Oral Once  . [DISCONTINUED] carvedilol  25 mg Oral QHS  . [DISCONTINUED] carvedilol  25 mg Oral Daily   Continuous Infusions:   Principal Problem:  *Acute-on-chronic respiratory failure Active  Problems:  End stage renal disease  COPD (chronic obstructive pulmonary disease)  Oxygen dependent  Leukocytosis    Time spent: 20 min    [name]  [facility] Pager [phone]. If 8PM-8AM, please contact night-coverage at [email or web address], password [facility] 08/20/2012, 4:37 PM  LOS: 1 day

## 2012-08-21 MED ORDER — HEPARIN SODIUM (PORCINE) 1000 UNIT/ML DIALYSIS: 1000.0000 [IU] | INTRAMUSCULAR | Status: DC | PRN

## 2012-08-21 MED ORDER — LIDOCAINE-PRILOCAINE 2.5-2.5 % EX CREA
1.0000 "application " | TOPICAL_CREAM | CUTANEOUS | Status: DC | PRN
  Filled 2012-08-21: qty 5

## 2012-08-21 MED ORDER — PENTAFLUOROPROP-TETRAFLUOROETH EX AERO: 1.0000 "application " | INHALATION_SPRAY | CUTANEOUS | Status: DC | PRN

## 2012-08-21 MED ORDER — SODIUM CHLORIDE 0.9 % IV SOLN: 100.0000 mL | INTRAVENOUS | Status: DC | PRN

## 2012-08-21 MED ORDER — CALCIUM CARBONATE ANTACID 500 MG PO CHEW
1.0000 | CHEWABLE_TABLET | Freq: Three times a day (TID) | ORAL | Status: DC
  Administered 2012-08-21: 200 mg via ORAL
  Filled 2012-08-21 (×4): qty 1

## 2012-08-21 MED ORDER — ACETAMINOPHEN 325 MG PO TABS
ORAL_TABLET | ORAL | Status: AC
  Filled 2012-08-21: qty 2

## 2012-08-21 MED ORDER — RENA-VITE PO TABS
1.0000 | ORAL_TABLET | Freq: Every day | ORAL | Status: DC
  Filled 2012-08-21: qty 1

## 2012-08-21 MED ORDER — ALTEPLASE 2 MG IJ SOLR: 2.0000 mg | Freq: Once | INTRAMUSCULAR | Status: DC | PRN

## 2012-08-21 MED ORDER — NEPRO/CARBSTEADY PO LIQD
237.0000 mL | ORAL | Status: DC | PRN
  Filled 2012-08-21: qty 237

## 2012-08-21 MED ORDER — LIDOCAINE HCL (PF) 1 % IJ SOLN: 5.0000 mL | INTRAMUSCULAR | Status: DC | PRN

## 2012-08-21 MED ORDER — HEPARIN SODIUM (PORCINE) 1000 UNIT/ML DIALYSIS: 20.0000 [IU]/kg | INTRAMUSCULAR | Status: DC | PRN

## 2012-08-21 NOTE — Progress Notes (Signed)
[facility] PROGRESS NOTE  COLLINS DIMARIA EXB:[id number] DOB: 1941-04-24 DOA: 08/19/2012 PCP: [name], MD  Assessment/Plan: 1. Volume overload: Status post dialysis yesterday and today. Appreciate nephrology help. We will reassess tomorrow to see if further dialysis needed 2. Nausea: Treat symptomatically 3. Chronic C. difficile: Continue by mouth vancomycin Goals of care: Patient is opting for less aggressive treatment, but is concerned that the family wants to be more aggressive.GOC mtg today at 1p  Code Status: Full code Family Communication: Plan discussed with patient and 2 of her 4 daughters at bedside Disposition Plan: Goals of care meeting planned for tomorrow at 1 PM   Consultants:  Vinson Moselle, nephrology  Procedures:  Status post dialysis today/yest  Antibiotics:  On by mouth vancomycin for C. difficile, was already on his outpatient  HPI/Subjective: Patient did not sleep well. Mild headache, nausea better. Is very tired.   Objective: Filed Vitals:   08/21/12 0759 08/21/12 0830 08/21/12 0900 08/21/12 0916  BP: 87/42 84/48 98/46  102/52  Pulse: 116  113 111  Temp:      TempSrc:      Resp: 26  25 25   Weight:    77.3 kg (170 lb 6.7 oz)  SpO2:        Intake/Output Summary (Last 24 hours) at 08/21/12 1252 Last data filed at 08/21/12 0916  Gross per 24 hour  Intake    480 ml  Output   3971 ml  Net  -3491 ml   Filed Weights   08/21/12 0502 08/21/12 0600 08/21/12 0916  Weight: 80.287 kg (177 lb) 79 kg (174 lb 2.6 oz) 77.3 kg (170 lb 6.7 oz)    Exam:   General:  Alert and oriented x3, fatigue, Cardiovascular: Regular rate and rhythm, S1-S2, 2/6 systolic ejection murmur  Respiratory: Decreased breath sounds throughout  Abdomen: Soft, obese, nontender, hypoactive bowel sounds  Extremities: No clubbing or cyanosis, trace pitting edema  Data Reviewed: Basic Metabolic Panel:  Lab 08/20/12 1027 08/19/12 2018  NA 135 133*  K 4.7 5.6*  CL 95*  94*  CO2 21 22  GLUCOSE 115* 170*  BUN 79* 77*  CREATININE 8.06* 8.00*  CALCIUM 8.9 8.6  MG -- --  PHOS -- --   Liver Function Tests:  Lab 08/19/12 2018  AST 15  ALT 14  ALKPHOS 90  BILITOT 0.3  PROT 6.4  ALBUMIN 2.6*   CBC:  Lab 08/20/12 0520 08/19/12 2018  WBC 13.6* 17.0*  NEUTROABS -- 15.3*  HGB 11.9* 11.5*  HCT 40.3 39.1  MCV 105.8* 105.4*  PLT 157 157   BNP (last 3 results)  Basename 08/19/12 2020  PROBNP 10460.0*   CBG:  Lab 08/20/12 0019  GLUCAP 123*      Studies: Dg Chest Portable 1 View  08/19/2012 IMPRESSION: 1.  Overall findings compatible with volume overload/pulmonary edema. 2.  Unchanged enlarged cardiac silhouette and prominence of the central pulmonary vasculature, nonspecific but may be seen in setting of pulmonary arterial hypertension.  Further evaluation with cardiac echo may be performed as clinically indicated.   Original Report Authenticated By: Tacey Ruiz, MD     Scheduled Meds:    . acetaminophen      . calcium carbonate  1 tablet Oral TID WC  . carvedilol  25 mg Oral QHS  . [COMPLETED] carvedilol  25 mg Oral Once  . fluticasone  1 puff Inhalation BID  . furosemide  80 mg Oral Daily  . multivitamin  1 tablet Oral QHS  . [  EXPIRED] ondansetron      . rOPINIRole  1 mg Oral TID  . sodium chloride  3 mL Intravenous Q12H  . sodium chloride  3 mL Intravenous Q12H  . vancomycin  125 mg Oral QID  . [DISCONTINUED] carvedilol  25 mg Oral Daily   Continuous Infusions:   Principal Problem:  *Acute-on-chronic respiratory failure Active Problems:  End stage renal disease  COPD (chronic obstructive pulmonary disease)  Oxygen dependent  Leukocytosis    Time spent: 15 min    Hollice Espy  [facility] Pager [phone]. If 8PM-8AM, please contact night-coverage at [email or web address], password [facility] 08/21/2012, 12:52 PM  LOS: 2 days

## 2012-08-21 NOTE — Discharge Summary (Addendum)
Physician Discharge Summary  [name] ZOX:[id number] DOB: 06-Mar-1941 DOA: 08/19/2012  PCP: [name], MD  Admit date: 08/19/2012 Discharge date: 08/21/2012  Time spent: 30 minutes  Recommendations for Outpatient Follow-up:  1. After goals of care meeting with nephrology, patient is now a DO NOT RESUSCITATE. She will further consider whether she wants systolic dialysis altogether. 2. Her next hemodialysis session is scheduled for next week 3. She is being discharged with home health physical therapy     Discharge Condition: Improved, being discharged home  Diet recommendation: Heart healthy  Filed Weights   08/21/12 0502 08/21/12 0600 08/21/12 0916  Weight: 80.287 kg (177 lb) 79 kg (174 lb 2.6 oz) 77.3 kg (170 lb 6.7 oz)    History of present illness:  71 yo female esrd normally dialysis on t/r/sat however she went to [place] last week and was arranged to get dialysis this past Monday which she did. On Monday after dialysis, she was told she could wait til Thursday to get her next dialysis however earlier in the day on 11/21, she started to get more sob. Acute onset. No fever/n/v/d. No cp. No abd pain. No le edema. Briefly was on bipap and given iv lasix and is feeling better. Chronically on 3 l oxygen at home for also copd. BNP was noted to be elevated in the 10,000.   Hospital Course:   Discharge Diagnoses:  Principal Problem:  *Acute-on-chronic respiratory failure: No evidence of pneumonia. This was felt to be secondary to volume overload. Patient underwent dialysis on 11/22 and repeat on 11/23. Discussion with nephrology they feel that they take her down to as much his dry weight as possible. In addition, the patient has had multiple hospitalizations in the past year. She is quite fatigued and is not sure her stronger she wants to keeping so aggressive. She feels as if her family is oftentimes pushing her into these decisions. Nephrology met with the patient's family for goals of  care meeting. a DO NOT RESUSCITATE status was obtained. Furthermore, the patient will think about and decide if in the future, she does not want to pursue dialysis altogether.  Active Problems:  End stage renal disease: See above   COPD (chronic obstructive pulmonary disease): Stable, currently on 3 L nasal cannula which is her baseline and oxygen saturations greater than 92%   Oxygen dependent  Leukocytosis: Felt to be stress margination. Not on antibiotics and her white blood cell count which was 17 on admission came down to 13 the following morning prior to hemodialysis  C. difficile colitis colon chronic. Patient is currently receiving by mouth vancomycin for a total of 6 weeks. She was continued on by mouth vancomycin during this hospitalization.  Procedures:  Hemodialysis done 11/22 and 11/23  Consultations:  Vinson Moselle, nephrology  Discharge Exam: Filed Vitals:   08/21/12 0759 08/21/12 0830 08/21/12 0900 08/21/12 0916  BP: 87/42 84/48 98/46  102/52  Pulse: 116  113 111  Temp:      TempSrc:      Resp: 26  25 25   Weight:    77.3 kg (170 lb 6.7 oz)  SpO2:        General: Alert and oriented x3, fatigue, mild distress secondary to fatigue Cardiovascular: Regular rate and rhythm, S1-S2, soft 2/6 systolic ejection murmur Respiratory: Decreased breath sounds throughout Abdomen: Soft, nontender, nondistended, positive bowel sounds Extremities: No clubbing or cyanosis, trace pitting edema Discharge Instructions  Discharge Orders    Future Appointments: Provider: Department: Dept Phone: Center:  08/24/2012 2:30 PM [facility] [phone] [facility]   08/24/2012 3:40 PM Evern Bio, NP [facility] [phone] [facility]   08/25/2012 11:00 AM Judyann Munson, MD [facility] [phone] [facility]   09/02/2012 11:10 AM Axel Filler, MD [facility] [place] [phone] None       Future Orders Please Complete By Expires   Diet - low sodium heart healthy      Increase activity slowly      Walk with assistance          Medication List     As of 08/21/2012  2:41 PM    TAKE these medications         albuterol (2.5 MG/3ML) 0.083% nebulizer solution   Commonly known as: PROVENTIL   Take 2.5 mg by nebulization every 6 (six) hours as needed. For shortness of breath      ALIGN PO   Take 1 capsule by mouth daily.      calcium carbonate 200 MG capsule   Take 200 mg by mouth 3 (three) times daily with meals.      carvedilol 25 MG tablet   Commonly known as: COREG   Take 25 mg by mouth daily.      fluticasone 110 MCG/ACT inhaler   Commonly known as: FLOVENT HFA   Inhale 1 puff into the lungs 2 (two) times daily.      furosemide 80 MG tablet   Commonly known as: LASIX   Take 80 mg by mouth daily.      rOPINIRole 1 MG tablet   Commonly known as: REQUIP   Take 1 mg by mouth 3 (three) times daily.      vancomycin 50 mg/mL oral solution   Commonly known as: VANCOCIN   Take 2.5 mLs (125 mg total) by mouth 4 (four) times daily. For 7 days then 125mg  TID for 14days then 125mg  BID for 14 days then 125mg  Daily for 14days then 125mg  every other day for 14days then stop           Follow-up Information    Follow up with [name], MD. In 1 month.   Contact information:   [address] [place] [address] [phone]       Follow up with HD as scheduled.          The results of significant diagnostics from this hospitalization (including imaging, microbiology, ancillary and laboratory) are listed below for reference.    Significant Diagnostic Studies:   Dg Chest Portable 1 View  08/19/2012  IMPRESSION: 1.  Overall findings compatible with volume overload/pulmonary edema. 2.  Unchanged enlarged cardiac silhouette and prominence of the central pulmonary vasculature, nonspecific but may be seen in setting of pulmonary arterial hypertension.   Further evaluation with cardiac echo may be performed as clinically indicated.   Original Report Authenticated By: Tacey Ruiz, MD     Microbiology: Recent Results (from the past 240 hour(s))  URINE CULTURE     Status: Normal (Preliminary result)   Collection Time   08/19/12  7:47 PM      Component Value Range Status Comment   Specimen Description URINE, CLEAN CATCH   Final    Special Requests NONE   Final    Culture  Setup Time 08/19/2012 21:35   Final    Colony Count PENDING   Incomplete    Culture Culture reincubated for better growth  Final    Report Status PENDING   Incomplete      Labs: Basic Metabolic Panel:  Lab 08/20/12 1610 08/19/12 2018  NA 135 133*  K 4.7 5.6*  CL 95* 94*  CO2 21 22  GLUCOSE 115* 170*  BUN 79* 77*  CREATININE 8.06* 8.00*  CALCIUM 8.9 8.6  MG -- --  PHOS -- --   Liver Function Tests:  Lab 08/19/12 2018  AST 15  ALT 14  ALKPHOS 90  BILITOT 0.3  PROT 6.4  ALBUMIN 2.6*   CBC:  Lab 08/20/12 0520 08/19/12 2018  WBC 13.6* 17.0*  NEUTROABS -- 15.3*  HGB 11.9* 11.5*  HCT 40.3 39.1  MCV 105.8* 105.4*  PLT 157 157   BNP: BNP (last 3 results)  Basename 08/19/12 2020  PROBNP 10460.0*   CBG:  Lab 08/20/12 0019  GLUCAP 123*       Signed:  Caymen Dubray K  [facility] 08/21/2012, 2:41 PM

## 2012-08-21 NOTE — Procedures (Signed)
I was present at this dialysis session. I have reviewed the session itself and made appropriate changes.   Vinson Moselle, MD [employer] 08/21/2012, 9:47 AM

## 2012-08-21 NOTE — Progress Notes (Signed)
CARE MANAGEMENT NOTE 08/21/2012  Patient:  [name],[name]   Account Number:  [id number]  Date Initiated:  08/20/2012  Documentation initiated by:  [name]  Subjective/Objective Assessment:   Patient admitted with SOB, fluid overload.     Action/Plan:   Progression of care and discharge planning   Anticipated DC Date:  08/21/2012   Anticipated DC Plan:  HOME W HOME HEALTH SERVICES      DC Planning Services  CM consult      [facility] Choice  HOME HEALTH   Choice offered to / List presented to:  C-1 Patient        HH arranged  HH-2 PT      [facility]  [facility] HEALTH   Status of service:  Completed, signed off Medicare Important Message given?   (If response is "NO", the following Medicare IM given date fields will be blank) Date Medicare IM given:   Date Additional Medicare IM given:    Discharge Disposition:  HOME W HOME HEALTH SERVICES  Per UR Regulation:    If discussed at Long Length of Stay Meetings, dates discussed:    Comments:  08/21/12 1530 [name] BSN Case Manager CM spoke with patient concerning home health needs at discharge. Patient states she has used [facility], and wants to do so now. [facility] and spoke with [name]. Faxed info and orders to intake @ [phone]. She will have someone contact Case Manager and patient concerning start of care date.

## 2012-08-21 NOTE — Progress Notes (Signed)
Spoke with patient and multiple family members today in the room about level of care, code status and dialysis.  The patient is clear that she does not want heroic measures- i.e., code blue, CPR, respirator etc in event of arrest, and the family is in agreement, so I will write the order for DNR.  We also talked about the option of withdrawal of dialysis at any time, and I gave some details to the patient and family of what that would look like. For now dialysis is to be continued.   Vinson Moselle  MD [facility] [phone] pgr    [phone] cell 08/21/2012, 2:05 PM

## 2012-08-21 NOTE — Progress Notes (Addendum)
[facility] Progress Note  Subjective:   Breathing a little better.   Objective Filed Vitals:   08/21/12 0630 08/21/12 0700 08/21/12 0730 08/21/12 0759  BP: 137/70 121/64 105/56 87/42  Pulse: 114 116 119 116  Temp:      TempSrc:      Resp: 27 22 24 26   Weight:      SpO2:  97%     Physical Exam goal 3 liters with 2 liters off and 20 min left on treatment General: feet elevated due to BP drop on HD. Heart: tachy reg Lungs: wheezing during cough.  Few crackles with diminished BS at bases Abdomen: obese soft Extremities: no LE edema, but feet elevated Dialysis Access: AVF- right upper patent; right I-J at Qb300  Outpatient HD: [facility] MWF, 4 hrs, EDW 78.5,, F160, 350/A1.5, 2K/2.25Ca bath, no profile. Hep 1600. EPO 21K. No Zemplar, Has TDC and AVF, just used AVF first time last HD with 16ga x 2   Assessment/Plan:  1. SOB due to pulm edema/vol excess in ESRD patient w COPD on home O2 at baseline >> Intolerant of large volume removal at one time.   2. 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 Hx sigmoid diverticulitis Oct 2013, treated medically- stable 4. ESRD, usual hd is TTS. HD yesterday to decrease volume and today. Kinetics reduced due to cath Qb300, but had HD yesterday too. 5. HTN/volume- takes Coreg only 25 once daily - tachycardic on HD yesterday; coreg resumed at dosed afterwards, then at HS starting today; UF Friday 2.1 with post wt of 79; pre HD weight 79 as well 6. Anemia of CKD- holding ESA 11/22 11.9 7. Secondary HPTH- no vit D, resume Tums ac as binder 8. Hx PAD/MI/bilat CEA 9. Hx COPD on home 02 10. DM 2 - diet controlled 11. Hx afib, ? Paroxsymal- not on coumadin, on a BB, not on antiplatelets 12. Nutrition - alb low; on high protein renal diet; add multivitamin. 13. EOL- multiple hospitalizations this year, pt is declining and is aware. Pt would consider DNR, she is not sure family will agree. Dr. Arlean Hopping  will try to set up family meeting this weekend  Sheffield Slider, Cordelia Poche [facility] Beeper [phone] 08/21/2012,8:42 AM  LOS: 2 days   Patient seen and examined and agree with assessment and plan as above. SBO improved, removing fluid as BP tolerates. Planning family meeting this weekend if possible to discuss EOL issues. Suzanne Moselle  MD [facility] [phone] pgr    [phone] cell 08/21/2012, 9:48 AM   Additional Objective Labs: Basic Metabolic Panel:  Lab 08/20/12 8416 08/19/12 2018  NA 135 133*  K 4.7 5.6*  CL 95* 94*  CO2 21 22  GLUCOSE 115* 170*  BUN 79* 77*  CREATININE 8.06* 8.00*  CALCIUM 8.9 8.6  ALB -- --  PHOS -- --   Liver Function Tests:  Lab 08/19/12 2018  AST 15  ALT 14  ALKPHOS 90  BILITOT 0.3  PROT 6.4  ALBUMIN 2.6*   CBC:  Lab 08/20/12 0520 08/19/12 2018  WBC 13.6* 17.0*  NEUTROABS -- 15.3*  HGB 11.9* 11.5*  HCT 40.3 39.1  MCV 105.8* 105.4*  PLT 157 157   Blood Culture    Component Value Date/Time   SDES URINE, CLEAN CATCH 08/19/2012 1947   SPECREQUEST NONE 08/19/2012 1947   CULT Culture reincubated for better growth 08/19/2012 1947   REPTSTATUS PENDING 08/19/2012 1947  CBG:  Lab 08/20/12 0019  GLUCAP 123*  Medications:      . [COMPLETED] acetaminophen      . acetaminophen      . carvedilol  25 mg Oral QHS  . [COMPLETED] carvedilol  25 mg Oral Once  . fluticasone  1 puff Inhalation BID  . furosemide  80 mg Oral Daily  . [EXPIRED] ondansetron      . rOPINIRole  1 mg Oral TID  . sodium chloride  3 mL Intravenous Q12H  . sodium chloride  3 mL Intravenous Q12H  . vancomycin  125 mg Oral QID

## 2012-08-22 LAB — URINE CULTURE: Colony Count: >=100000

## 2012-08-23 ENCOUNTER — Encounter: Payer: Self-pay | Admitting: Neurosurgery

## 2012-08-23 NOTE — ED Provider Notes (Signed)
  I performed a history and physical examination of [name] and discussed her management with Dr. Freida Busman.  I agree with the history, physical, assessment, and plan of care, with the following exceptions: None  On my exam this patient was in no distress, though her dyspnea was evident.  With multiple co-morbidities, the patient was admitted for further E/M. [name], Elvis Coil, MD 08/23/12 [phone]

## 2012-08-24 ENCOUNTER — Ambulatory Visit (INDEPENDENT_AMBULATORY_CARE_PROVIDER_SITE_OTHER): Payer: Medicare Other | Admitting: Vascular Surgery

## 2012-08-24 ENCOUNTER — Encounter: Payer: Self-pay | Admitting: Neurosurgery

## 2012-08-24 ENCOUNTER — Ambulatory Visit (INDEPENDENT_AMBULATORY_CARE_PROVIDER_SITE_OTHER): Payer: Medicare Other | Admitting: Neurosurgery

## 2012-08-24 VITALS — BP 90/56 | HR 108 | Temp 99.3°F | Resp 14 | Ht 64.0 in | Wt 177.0 lb

## 2012-08-24 DIAGNOSIS — Z48812 Encounter for surgical aftercare following surgery on the circulatory system: Secondary | ICD-10-CM

## 2012-08-24 DIAGNOSIS — I6529 Occlusion and stenosis of unspecified carotid artery: Secondary | ICD-10-CM | POA: Insufficient documentation

## 2012-08-24 NOTE — Progress Notes (Signed)
[facility] Carotid Office Note  CC: Carotid surveillance Referring Physician: Hart Rochester  History of Present Illness: 71 year old female patient of Dr. Hart Rochester status post bilateral CEAs in early 2011. The patient denies any signs or symptoms of CVA, TIA, amaurosis fugax or any neural deficit. The patient is on nasal cannula oxygen and is immobile in a wheelchair due to multiple medical problems.  Past Medical History  Diagnosis Date  . Carotid artery occlusion     bilat CEA in 2012?  . C. 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 Hypertension   . Atrial fibrillation April  2013  . Pulmonary hypertension   . Secondary hyperparathyroidism, renal   . COPD (chronic obstructive pulmonary disease)   . Hypercholesterolemia   . History of MI (myocardial infarction)     "they say I had 2 years ago" (08/20/2012)  . GERD (gastroesophageal reflux disease)   . History of pneumonia     "have it alot" (08/20/2012)  . Sleep apnea     "don't wear mask anymore"  . Type II diabetes mellitus   . History of blood transfusion 2013    "3 times so far in 2013:  4 pints one time; 2 pints another; ?# last time" (08/20/2012)  . Iron deficiency anemia   . Seizures 2012    after carotid surgery  . ESRD on dialysis     "[facility]; Tues, Thurs; Sat"  . Renal cell carcinoma 2005?  Marland Kitchen On home oxygen therapy     "24/7"   . Acute diverticulitis     Oct 2013, treated medically [facility], sigmoid diverticulitits    ROS: [x]  Positive   [ ]  Denies    General: [ ]  Weight loss, [ ]  Fever, [ ]  chills Neurologic: [ ]  Dizziness, [ ]  Blackouts, [ ]  Seizure [ ]  Stroke, [ ]  "Mini stroke", [ ]  Slurred speech, [ ]  Temporary blindness; [ ]  weakness in arms or legs, [ ]  Hoarseness Cardiac: [ ]  Chest pain/pressure, [ ]  Shortness of breath at rest [ ]  Shortness of breath with exertion, [ ]  Atrial fibrillation or irregular heartbeat Vascular: [ ]  Pain in legs with walking,  [ ]  Pain in legs at rest, [ ]  Pain in legs at night,  [ ]  Non-healing ulcer, [ ]  Blood clot in vein/DVT,   Pulmonary: [ ]  Home oxygen, [ ]  Productive cough, [ ]  Coughing up blood, [ ]  Asthma,  [ ]  Wheezing Musculoskeletal:  [ ]  Arthritis, [ ]  Low back pain, [ ]  Joint pain Hematologic: [ ]  Easy Bruising, [ ]  Anemia; [ ]  Hepatitis Gastrointestinal: [ ]  Blood in stool, [ ]  Gastroesophageal Reflux/heartburn, [ ]  Trouble swallowing Urinary: [ ]  chronic Kidney disease, [ ]  on HD - [ ]  MWF or [ ]  TTHS, [ ]  Burning with urination, [ ]  Difficulty urinating Skin: [ ]  Rashes, [ ]  Wounds Psychological: [ ]  Anxiety, [ ]  Depression   Social History History  Substance Use Topics  . Smoking status: Former Smoker -- 1.5 packs/day for 45 years    Types: Cigarettes    Quit date: 09/30/2003  . Smokeless tobacco: Never Used  . Alcohol Use: No    Family History Family History  Problem Relation Age of Onset  . Cancer Mother     pancreatic  . Stroke Father   . Coronary artery disease Father   . Cancer Brother     Brain  . Kidney disease Brother  Kidney stones    Allergies  Allergen Reactions  . Morphine And Related Nausea And Vomiting  . Lipitor (Atorvastatin) Other (See Comments)    Causes drowsiness    Current Outpatient Prescriptions  Medication Sig Dispense Refill  . albuterol (PROVENTIL) (2.5 MG/3ML) 0.083% nebulizer solution Take 2.5 mg by nebulization every 6 (six) hours as needed. For shortness of breath      . calcium carbonate 200 MG capsule Take 200 mg by mouth 3 (three) times daily with meals.       . carvedilol (COREG) 25 MG tablet Take 25 mg by mouth daily.      . fluticasone (FLOVENT HFA) 110 MCG/ACT inhaler Inhale 1 puff into the lungs 2 (two) times daily.      . furosemide (LASIX) 80 MG tablet Take 80 mg by mouth daily.       . Probiotic Product (ALIGN PO) Take 1 capsule by mouth daily.      Marland Kitchen rOPINIRole (REQUIP) 1 MG tablet Take 1 mg by mouth 3 (three) times daily.        . vancomycin (VANCOCIN) 50 mg/mL oral solution Take 2.5 mLs (125 mg total) by mouth 4 (four) times daily. For 7 days then 125mg  TID for 14days then 125mg  BID for 14 days then 125mg  Daily for 14days then 125mg  every other day for 14days then stop  25 mL  5    Physical Examination  Filed Vitals:   08/24/12 1525  BP: 90/56  Pulse: 108  Temp: 99.3 F (37.4 C)  Resp: 14    Body mass index is 30.38 kg/(m^2).  General:  WDWN in NAD Gait: Normal HEENT: WNL Eyes: Pupils equal Pulmonary: normal non-labored breathing , without Rales, rhonchi,  wheezing Cardiac: RRR, without  Murmurs, rubs or gallops; Abdomen: soft, NT, no masses Skin: no rashes, ulcers noted  Vascular Exam Pulses: 2+ radial pulses bilaterally Carotid bruits: Carotid pulses to auscultation no bruits are heard Extremities without ischemic changes, no Gangrene , no cellulitis; no open wounds;  Musculoskeletal: no muscle wasting or atrophy   Neurologic: A&O X 3; Appropriate Affect ; SENSATION: normal; MOTOR FUNCTION:  moving all extremities equally. Speech is fluent/normal  Non-Invasive Vascular Imaging CAROTID DUPLEX 08/24/2012  Right ICA 40 - 59 % stenosis Left ICA 20 - 39 % stenosis   ASSESSMENT/PLAN: Slight increase in right ICA stenosis and previous exam, the patient will followup in one year with repeat carotid duplex. The patient's questions were encouraged and answered, she is in agreement with this plan.  Lauree Chandler ANP   Clinic MD: Hart Rochester

## 2012-08-24 NOTE — Progress Notes (Signed)
Carotid duplex performed @ [facility] 08/24/2012 

## 2012-08-25 ENCOUNTER — Encounter: Payer: Self-pay | Admitting: Internal Medicine

## 2012-08-25 ENCOUNTER — Ambulatory Visit (INDEPENDENT_AMBULATORY_CARE_PROVIDER_SITE_OTHER): Payer: Medicare Other | Admitting: Internal Medicine

## 2012-08-25 VITALS — BP 87/51 | HR 104 | Temp 98.1°F | Wt 177.0 lb

## 2012-08-25 DIAGNOSIS — A0472 Enterocolitis due to Clostridium difficile, not specified as recurrent: Secondary | ICD-10-CM

## 2012-08-25 NOTE — Addendum Note (Signed)
Addended by: Melodye Ped C on: 08/25/2012 01:31 PM   Modules accepted: Orders

## 2012-08-25 NOTE — Progress Notes (Signed)
[facility]  RFV: hospital follow up for diskitis and c.difficile infection Subjective:    Patient ID: [name], [name]    DOB: 04-05-1941, 71 y.o.   MRN: [id number]  HPI  71 yo F COPD where 3L [place],DM, ESRD on HD on t-th-sat. Recently diverticulitis admission. Currently  Having 10 BM in a day. Mostly notices to have BM post prandial. Loose stools, urgency to defecate. No blood and not watery. Abdominal cramping is mild, now intermittent. She states that she has had recurrent c.difficile including receiving flagyl, vancomycin as well as vancomycin taper. She is here with her daughter to discuss FMT  Allergies  Allergen Reactions  . Morphine And Related Nausea And Vomiting  . Lipitor (Atorvastatin) Other (See Comments)    Causes drowsiness     Current Outpatient Prescriptions on File Prior to Visit  Medication Sig Dispense Refill  . albuterol (PROVENTIL) (2.5 MG/3ML) 0.083% nebulizer solution Take 2.5 mg by nebulization every 6 (six) hours as needed. For shortness of breath      . calcium carbonate 200 MG capsule Take 200 mg by mouth 3 (three) times daily with meals.       . carvedilol (COREG) 25 MG tablet Take 25 mg by mouth daily.      . fluticasone (FLOVENT HFA) 110 MCG/ACT inhaler Inhale 1 puff into the lungs 2 (two) times daily.      . furosemide (LASIX) 80 MG tablet Take 80 mg by mouth daily.       . Probiotic Product (ALIGN PO) Take 1 capsule by mouth daily.      Marland Kitchen rOPINIRole (REQUIP) 1 MG tablet Take 1 mg by mouth 3 (three) times daily.      . vancomycin (VANCOCIN) 50 mg/mL oral solution Take 2.5 mLs (125 mg total) by mouth 4 (four) times daily. For 7 days then 125mg  TID for 14days then 125mg  BID for 14 days then 125mg  Daily for 14days then 125mg  every other day for 14days then stop  25 mL  5    Social hx: lives in [place]; previously worked in mills, smoker, and truck Hospital doctor. Stopped smoking in 2005.   Daughter: patricia Denison craven 01/24/70: meds: tylenol sinus. Pmhx:  /all: penicillin  pat: [phone]  Review of Systems     Objective:   Physical Exam BP 87/51  Pulse 104  Temp 98.1 F (36.7 C) (Oral)  Wt 177 lb (80.287 kg)  SpO2 93% Physical Exam  Constitutional: He is oriented to person, place, and time. Chronically ill.  HENT:  Mouth/Throat: Oropharynx is clear and moist. No oropharyngeal exudate.  Cardiovascular: Normal rate, regular rhythm and normal heart sounds. Exam reveals no gallop and no friction rub.  No murmur heard.  Pulmonary/Chest: Effort normal and breath sounds normal. No respiratory distress. He has no wheezes.  Abdominal: Soft. Bowel sounds are normal. He exhibits no distension. There is no tenderness.  Lymphadenopathy: has no cervical adenopathy.   Skin: Skin is warm and dry. No rash noted. No erythema.  Psychiatric:  a normal mood and affect. His behavior is normal.         Assessment & Plan:  cdifficile recurrent infection = continue with taper ; will need to get older records to get better understanding of her previous flares. If no improvement with taper, will consider to do FMT.

## 2012-09-02 ENCOUNTER — Encounter (INDEPENDENT_AMBULATORY_CARE_PROVIDER_SITE_OTHER): Payer: Medicare Other | Admitting: General Surgery

## 2012-09-02 ENCOUNTER — Inpatient Hospital Stay: Payer: Medicare Other | Admitting: Internal Medicine

## 2012-09-09 NOTE — Discharge Summary (Addendum)
Physician Discharge Summary  Patient ID: [name] MRN: [id number] DOB/AGE: May 04, 1941 71 y.o.  Admit date: 07/27/2012 Discharge date: 08/05/2012  Primary Care Physician:  Suzanne Rossetti, MD   Discharge Diagnoses:    Principal Problem:  *Diverticulitis of sigmoid colon Active Problems:  End stage renal disease  DM2 (diabetes mellitus, type 2)  Anemia  Pulmonary edema  Clostridium difficile diarrhea      Medication List     As of 09/09/2012  5:38 PM    STOP taking these medications         ezetimibe 10 MG tablet   Commonly known as: ZETIA      furosemide 80 MG tablet   Commonly known as: LASIX      TAKE these medications         albuterol (2.5 MG/3ML) 0.083% nebulizer solution   Commonly known as: PROVENTIL   Take 2.5 mg by nebulization every 6 (six) hours as needed. For shortness of breath      calcium carbonate 200 MG capsule   Take 200 mg by mouth 3 (three) times daily with meals.      carvedilol 25 MG tablet   Commonly known as: COREG   Take 25 mg by mouth daily.      fluticasone 110 MCG/ACT inhaler   Commonly known as: FLOVENT HFA   Inhale 1 puff into the lungs 2 (two) times daily.      rOPINIRole 1 MG tablet   Commonly known as: REQUIP   Take 1 mg by mouth 3 (three) times daily.      vancomycin 50 mg/mL oral solution   Commonly known as: VANCOCIN   Take 2.5 mLs (125 mg total) by mouth 4 (four) times daily. For 7 days then 125mg  TID for 14days then 125mg  BID for 14 days then 125mg  Daily for 14days then 125mg  every other day for 14days then stop         Disposition and Follow-up:  PCP in 1 week ID clinic in 4 weeks  Consults:  Renal ID, Dr.Snider CCS, Dr.Blackman   Significant Diagnostic Studies:  No results found.  Brief H and P: 71 year-old female with history of ESRD on hemodialysis presented to the ER to [facility] with complaints of worsening abdominal pain. Patient has been having abdominal pain worsening over the last 2  weeks but last 2 days it has become severe. The pain is mostly in the left lower quadrant suprapubic area. Patient has been having associated diarrhea with nausea and vomiting. 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 Patient denies any chest pain or shortness of breath fever chills. CT abdomen pelvis done showed sigmoid diverticulitis with possible abscess formation and patient has been transferred to [facility] since patient is a dialysis patient. Presently patient is pain-free after patient had received pain relief medications at [place].    Hospital Course:  #1. Sigmoid diverticulitis with possible abscess with recurrent C. difficile last May this year(per patient this current episode is her fourth)  -per IR abscess too small to place drain  -pt was improving clinically, changed to po abx per surgery>>11/1  -started on oral vancomycin to be tapered as per ID recs -Pt still with diarrhea, Continue antibiotics as above oral vanc taper,  - Discussed with ID, stopped Cipro and Flagyl since completed 8 days of abx for this and concomitant c diff colitis -appreciate surgery and ID assistance  -FU in ID clinic for FMT  -#2. ESRD  on hemodialysis - patient missed her dialysis 10/29 due to the pain. Patient usually gets dialyzed on Tuesday Thursday and Saturdays.  -she was SOB on 10/30, CXR with pulm edema and she was dialyzed  -per renal  #3. CAD status post stenting - chest pain free. Patient states she was taken off Plavix one year ago. Continue Coreg.  #4. Diabetes mellitus2  -holding Glucotrol  -continue sliding-scale coverage.  #5. Left kidney lesion as per the CAT scan done at [facility] - 2.2 cm. MRI with 2cm cyst - benign. Pt states she was aware of this lesion and had been followed in the past at [facility].    Time spent on Discharge:  Signed: [name] [facility]  09/09/2012, 5:38 PM

## 2012-09-22 ENCOUNTER — Emergency Department (HOSPITAL_COMMUNITY): Payer: Medicare Other

## 2012-09-22 ENCOUNTER — Encounter (HOSPITAL_COMMUNITY): Payer: Self-pay | Admitting: Emergency Medicine

## 2012-09-22 ENCOUNTER — Inpatient Hospital Stay (HOSPITAL_COMMUNITY)
Admission: EM | Admit: 2012-09-22 | Discharge: 2012-09-25 | DRG: 208 | Disposition: A | Discharge status: Home Health | Payer: Medicare Other | Attending: Pulmonary Disease | Admitting: Pulmonary Disease

## 2012-09-22 DIAGNOSIS — Z823 Family history of stroke: Secondary | ICD-10-CM

## 2012-09-22 DIAGNOSIS — J9620 Acute and chronic respiratory failure, unspecified whether with hypoxia or hypercapnia: Secondary | ICD-10-CM

## 2012-09-22 DIAGNOSIS — J449 Chronic obstructive pulmonary disease, unspecified: Secondary | ICD-10-CM | POA: Diagnosis present

## 2012-09-22 DIAGNOSIS — Z79899 Other long term (current) drug therapy: Secondary | ICD-10-CM

## 2012-09-22 DIAGNOSIS — Z9119 Patient's noncompliance with other medical treatment and regimen: Secondary | ICD-10-CM

## 2012-09-22 DIAGNOSIS — I120 Hypertensive chronic kidney disease with stage 5 chronic kidney disease or end stage renal disease: Secondary | ICD-10-CM | POA: Diagnosis present

## 2012-09-22 DIAGNOSIS — Z9981 Dependence on supplemental oxygen: Secondary | ICD-10-CM

## 2012-09-22 DIAGNOSIS — Z9861 Coronary angioplasty status: Secondary | ICD-10-CM

## 2012-09-22 DIAGNOSIS — I2510 Atherosclerotic heart disease of native coronary artery without angina pectoris: Secondary | ICD-10-CM | POA: Diagnosis present

## 2012-09-22 DIAGNOSIS — D649 Anemia, unspecified: Secondary | ICD-10-CM

## 2012-09-22 DIAGNOSIS — Z992 Dependence on renal dialysis: Secondary | ICD-10-CM

## 2012-09-22 DIAGNOSIS — E7800 Pure hypercholesterolemia, unspecified: Secondary | ICD-10-CM | POA: Diagnosis present

## 2012-09-22 DIAGNOSIS — J811 Chronic pulmonary edema: Secondary | ICD-10-CM

## 2012-09-22 DIAGNOSIS — Z8249 Family history of ischemic heart disease and other diseases of the circulatory system: Secondary | ICD-10-CM

## 2012-09-22 DIAGNOSIS — A0472 Enterocolitis due to Clostridium difficile, not specified as recurrent: Secondary | ICD-10-CM

## 2012-09-22 DIAGNOSIS — D72829 Elevated white blood cell count, unspecified: Secondary | ICD-10-CM | POA: Diagnosis present

## 2012-09-22 DIAGNOSIS — J9600 Acute respiratory failure, unspecified whether with hypoxia or hypercapnia: Principal | ICD-10-CM

## 2012-09-22 DIAGNOSIS — Z91158 Patient's noncompliance with renal dialysis for other reason: Secondary | ICD-10-CM

## 2012-09-22 DIAGNOSIS — Z85528 Personal history of other malignant neoplasm of kidney: Secondary | ICD-10-CM

## 2012-09-22 DIAGNOSIS — Z905 Acquired absence of kidney: Secondary | ICD-10-CM

## 2012-09-22 DIAGNOSIS — N186 End stage renal disease: Secondary | ICD-10-CM | POA: Diagnosis present

## 2012-09-22 DIAGNOSIS — E119 Type 2 diabetes mellitus without complications: Secondary | ICD-10-CM | POA: Diagnosis present

## 2012-09-22 DIAGNOSIS — Z9115 Patient's noncompliance with renal dialysis: Secondary | ICD-10-CM

## 2012-09-22 DIAGNOSIS — K5732 Diverticulitis of large intestine without perforation or abscess without bleeding: Secondary | ICD-10-CM

## 2012-09-22 DIAGNOSIS — E875 Hyperkalemia: Secondary | ICD-10-CM | POA: Diagnosis present

## 2012-09-22 DIAGNOSIS — Z91199 Patient's noncompliance with other medical treatment and regimen due to unspecified reason: Secondary | ICD-10-CM

## 2012-09-22 DIAGNOSIS — I252 Old myocardial infarction: Secondary | ICD-10-CM

## 2012-09-22 DIAGNOSIS — D62 Acute posthemorrhagic anemia: Secondary | ICD-10-CM | POA: Diagnosis present

## 2012-09-22 DIAGNOSIS — G9340 Encephalopathy, unspecified: Secondary | ICD-10-CM

## 2012-09-22 DIAGNOSIS — K219 Gastro-esophageal reflux disease without esophagitis: Secondary | ICD-10-CM | POA: Diagnosis present

## 2012-09-22 DIAGNOSIS — I6529 Occlusion and stenosis of unspecified carotid artery: Secondary | ICD-10-CM

## 2012-09-22 DIAGNOSIS — J810 Acute pulmonary edema: Secondary | ICD-10-CM

## 2012-09-22 DIAGNOSIS — G9349 Other encephalopathy: Secondary | ICD-10-CM | POA: Diagnosis present

## 2012-09-22 DIAGNOSIS — J9601 Acute respiratory failure with hypoxia: Secondary | ICD-10-CM

## 2012-09-22 DIAGNOSIS — Z87891 Personal history of nicotine dependence: Secondary | ICD-10-CM

## 2012-09-22 DIAGNOSIS — Z888 Allergy status to other drugs, medicaments and biological substances status: Secondary | ICD-10-CM

## 2012-09-22 DIAGNOSIS — N2581 Secondary hyperparathyroidism of renal origin: Secondary | ICD-10-CM | POA: Diagnosis present

## 2012-09-22 DIAGNOSIS — T82898A Other specified complication of vascular prosthetic devices, implants and grafts, initial encounter: Secondary | ICD-10-CM

## 2012-09-22 DIAGNOSIS — J4489 Other specified chronic obstructive pulmonary disease: Secondary | ICD-10-CM | POA: Diagnosis present

## 2012-09-22 DIAGNOSIS — D631 Anemia in chronic kidney disease: Secondary | ICD-10-CM | POA: Diagnosis present

## 2012-09-22 LAB — CBC WITH DIFFERENTIAL/PLATELET
Basophils Relative: 1 % (ref 0–1)
Eosinophils Relative: 3 % (ref 0–5)
HCT: 34.2 % — ABNORMAL LOW (ref 36.0–46.0)
Hemoglobin: 9.8 g/dL — ABNORMAL LOW (ref 12.0–15.0)
Lymphocytes Relative: 25 % (ref 12–46)
MCH: 31 pg (ref 26.0–34.0)
Monocytes Absolute: 1.3 10*3/uL — ABNORMAL HIGH (ref 0.1–1.0)
Neutro Abs: 21 10*3/uL — ABNORMAL HIGH (ref 1.7–7.7)
Neutrophils Relative %: 67 % (ref 43–77)
RBC: 3.16 MIL/uL — ABNORMAL LOW (ref 3.87–5.11)

## 2012-09-22 LAB — COMPREHENSIVE METABOLIC PANEL
ALT: 12 U/L (ref 0–35)
AST: 34 U/L (ref 0–37)
Alkaline Phosphatase: 109 U/L (ref 39–117)
CO2: 25 mEq/L (ref 19–32)
Chloride: 95 mEq/L — ABNORMAL LOW (ref 96–112)
GFR calc non Af Amer: 6 mL/min — ABNORMAL LOW (ref >90)
Glucose, Bld: 201 mg/dL — ABNORMAL HIGH (ref 70–99)
Sodium: 134 mEq/L — ABNORMAL LOW (ref 135–145)
Total Bilirubin: 0.3 mg/dL (ref 0.3–1.2)

## 2012-09-22 LAB — URINALYSIS, MICROSCOPIC ONLY
Bilirubin Urine: NEGATIVE
Glucose, UA: 250 mg/dL — AB
Hgb urine dipstick: NEGATIVE
Ketones, ur: NEGATIVE mg/dL
Protein, ur: >300 mg/dL — AB

## 2012-09-22 LAB — LACTIC ACID, PLASMA: Lactic Acid, Venous: 1 mmol/L (ref 0.5–2.2)

## 2012-09-22 LAB — PROTIME-INR
INR: 1.03 (ref 0.00–1.49)
Prothrombin Time: 13.4 seconds (ref 11.6–15.2)

## 2012-09-22 LAB — POCT I-STAT 3, ART BLOOD GAS (G3+)
TCO2: 34 mmol/L (ref 0–100)
pH, Arterial: 7.149 — CL (ref 7.350–7.450)

## 2012-09-22 MED ORDER — CHLORHEXIDINE GLUCONATE 0.12 % MT SOLN
15.0000 mL | Freq: Two times a day (BID) | OROMUCOSAL | Status: DC
  Administered 2012-09-22 – 2012-09-23 (×2): 15 mL via OROMUCOSAL
  Filled 2012-09-22 (×2): qty 15

## 2012-09-22 MED ORDER — HEPARIN SODIUM (PORCINE) 5000 UNIT/ML IJ SOLN
5000.0000 [IU] | Freq: Three times a day (TID) | INTRAMUSCULAR | Status: DC
  Administered 2012-09-23 – 2012-09-25 (×8): 5000 [IU] via SUBCUTANEOUS
  Filled 2012-09-22 (×11): qty 1

## 2012-09-22 MED ORDER — PROPOFOL 10 MG/ML IV EMUL
5.0000 ug/kg/min | Freq: Once | INTRAVENOUS | Status: AC
  Administered 2012-09-22: 10 ug/kg/min via INTRAVENOUS
  Filled 2012-09-22: qty 100

## 2012-09-22 MED ORDER — VANCOMYCIN 50 MG/ML ORAL SOLUTION
125.0000 mg | Freq: Four times a day (QID) | ORAL | Status: DC
  Filled 2012-09-22: qty 2.5

## 2012-09-22 MED ORDER — INSULIN ASPART 100 UNIT/ML ~~LOC~~ SOLN
0.0000 [IU] | SUBCUTANEOUS | Status: DC
  Administered 2012-09-23: 3 [IU] via SUBCUTANEOUS
  Administered 2012-09-24: 2 [IU] via SUBCUTANEOUS
  Administered 2012-09-24: 3 [IU] via SUBCUTANEOUS

## 2012-09-22 MED ORDER — PROPOFOL 10 MG/ML IV EMUL: 5.0000 ug/kg/min | INTRAVENOUS | Status: DC

## 2012-09-22 MED ORDER — SODIUM CHLORIDE 0.9 % IV SOLN
1.0000 mg/h | INTRAVENOUS | Status: DC
  Administered 2012-09-22: 1 mg/h via INTRAVENOUS
  Filled 2012-09-22: qty 10

## 2012-09-22 MED ORDER — ETOMIDATE 2 MG/ML IV SOLN
30.0000 mg | Freq: Once | INTRAVENOUS | Status: AC
  Administered 2012-09-22: 30 mg via INTRAVENOUS

## 2012-09-22 MED ORDER — VANCOMYCIN 50 MG/ML ORAL SOLUTION: 125.0000 mg | Freq: Two times a day (BID) | ORAL | Status: DC

## 2012-09-22 MED ORDER — ETOMIDATE 2 MG/ML IV SOLN
INTRAVENOUS | Status: AC
  Administered 2012-09-22: 30 mg via INTRAVENOUS
  Filled 2012-09-22: qty 20

## 2012-09-22 MED ORDER — BIOTENE DRY MOUTH MT LIQD
1.0000 "application " | Freq: Four times a day (QID) | OROMUCOSAL | Status: DC
  Administered 2012-09-23 (×3): 15 mL via OROMUCOSAL

## 2012-09-22 MED ORDER — NITROGLYCERIN IN D5W 200-5 MCG/ML-% IV SOLN: 2.0000 ug/min | INTRAVENOUS | Status: DC

## 2012-09-22 MED ORDER — VANCOMYCIN 50 MG/ML ORAL SOLUTION: 125.0000 mg | ORAL | Status: DC

## 2012-09-22 MED ORDER — ROCURONIUM BROMIDE 50 MG/5ML IV SOLN
100.0000 mg | Freq: Once | INTRAVENOUS | Status: AC
  Administered 2012-09-22: 100 mg via INTRAVENOUS

## 2012-09-22 MED ORDER — FENTANYL CITRATE 0.05 MG/ML IJ SOLN
10.0000 ug/h | INTRAMUSCULAR | Status: DC
  Administered 2012-09-22: 10 ug/h via INTRAVENOUS
  Filled 2012-09-22: qty 50

## 2012-09-22 MED ORDER — SODIUM POLYSTYRENE SULFONATE 15 GM/60ML PO SUSP
30.0000 g | Freq: Once | ORAL | Status: AC
  Administered 2012-09-22: 30 g via ORAL
  Filled 2012-09-22: qty 120

## 2012-09-22 MED ORDER — VANCOMYCIN 50 MG/ML ORAL SOLUTION: 125.0000 mg | Freq: Every day | ORAL | Status: DC

## 2012-09-22 MED ORDER — IPRATROPIUM-ALBUTEROL 18-103 MCG/ACT IN AERO
6.0000 | INHALATION_SPRAY | RESPIRATORY_TRACT | Status: DC
  Administered 2012-09-23 (×3): 6 via RESPIRATORY_TRACT
  Filled 2012-09-22: qty 14.7

## 2012-09-22 MED ORDER — FUROSEMIDE 10 MG/ML IJ SOLN
80.0000 mg | Freq: Two times a day (BID) | INTRAMUSCULAR | Status: DC
  Administered 2012-09-22: 80 mg via INTRAVENOUS
  Filled 2012-09-22 (×2): qty 4
  Filled 2012-09-22 (×2): qty 8

## 2012-09-22 MED ORDER — SODIUM CHLORIDE 0.9 % IV SOLN
1.0000 g | Freq: Once | INTRAVENOUS | Status: AC
  Administered 2012-09-22: 1 g via INTRAVENOUS
  Filled 2012-09-22: qty 10

## 2012-09-22 MED ORDER — IPRATROPIUM-ALBUTEROL 18-103 MCG/ACT IN AERO: 6.0000 | INHALATION_SPRAY | RESPIRATORY_TRACT | Status: DC | PRN

## 2012-09-22 MED ORDER — METRONIDAZOLE IN NACL 5-0.79 MG/ML-% IV SOLN: 500.0000 mg | Freq: Two times a day (BID) | INTRAVENOUS | Status: DC

## 2012-09-22 MED ORDER — LIDOCAINE HCL (CARDIAC) 20 MG/ML IV SOLN
INTRAVENOUS | Status: AC
  Filled 2012-09-22: qty 5

## 2012-09-22 MED ORDER — VANCOMYCIN 50 MG/ML ORAL SOLUTION
125.0000 mg | Freq: Four times a day (QID) | ORAL | Status: DC
  Administered 2012-09-22 – 2012-09-25 (×9): 125 mg via ORAL
  Filled 2012-09-22 (×15): qty 2.5

## 2012-09-22 MED ORDER — ROCURONIUM BROMIDE 50 MG/5ML IV SOLN
INTRAVENOUS | Status: AC
  Administered 2012-09-22: 100 mg via INTRAVENOUS
  Filled 2012-09-22: qty 2

## 2012-09-22 MED ORDER — PANTOPRAZOLE SODIUM 40 MG IV SOLR
40.0000 mg | INTRAVENOUS | Status: DC
  Administered 2012-09-22 – 2012-09-25 (×3): 40 mg via INTRAVENOUS
  Filled 2012-09-22 (×5): qty 40

## 2012-09-22 MED ORDER — FUROSEMIDE 10 MG/ML IJ SOLN: 80.0000 mg | Freq: Three times a day (TID) | INTRAMUSCULAR | Status: DC

## 2012-09-22 MED ORDER — SUCCINYLCHOLINE CHLORIDE 20 MG/ML IJ SOLN
INTRAMUSCULAR | Status: AC
  Filled 2012-09-22: qty 1

## 2012-09-22 NOTE — ED Provider Notes (Signed)
71 year-old female dialysis patient who missed dialysis yesterday and was brought in by EMS because of respiratory distress and altered mental status. On arrival, she was unresponsive to even deep painful stimuli but will was breathing on her own. She is maintaining oxygen saturations in the low 90s on nonrebreather mask. Lungs are clear and heart had regular rate and rhythm. She had no peripheral edema. However, because of low Glasgow Coma Scale score and concern about her airway, was elected to intubate her. Intraosseous line was inserted because of lack of good veins. She was successfully intubated after RSI with etomidate and rocuronium. I Drew blood from her right femoral vein because of inability to find good veins for venipuncture. Blood has been sent for routine laboratory workup and chest x-ray and ECG are pending.   Date: 09/22/2012  Rate: 114  Rhythm: sinus tachycardia  QRS Axis: normal  Intervals: normal  ST/T Wave abnormalities: nonspecific ST changes  Conduction Disutrbances:none  Narrative Interpretation: Sinus tachycardia, old anteroseptal myocardial infarction with residual ST elevation. When compared with ECG of 07/07/2012, no significant changes are seen. No ECG changes of hyperkalemia.  Old EKG Reviewed: unchanged  CRITICAL CARE Performed by: ZOXWR,UEAVW   Total critical care time: 50 minutes  Critical care time was exclusive of separately billable procedures and treating other patients.  Critical care was necessary to treat or prevent imminent or life-threatening deterioration.  Critical care was time spent personally by me on the following activities: development of treatment plan with patient and/or surrogate as well as nursing, discussions with consultants, evaluation of patient's response to treatment, examination of patient, obtaining history from patient or surrogate, ordering and performing treatments and interventions, ordering and review of laboratory studies,  ordering and review of radiographic studies, pulse oximetry and re-evaluation of patient's condition.  I saw and evaluated the patient, reviewed the resident's note and I agree with the findings and plan.   Dione Booze, MD 09/23/12 0000

## 2012-09-22 NOTE — Progress Notes (Signed)
Changes to vent settings per ABG. Rate increased to 20 breaths per minute. O2 now at 60%.

## 2012-09-22 NOTE — ED Notes (Signed)
Myself and [place], EMT undressed pt, placed on monitor, continuous pulse oximetry, blood pressure cuff with RT maintaining the airway; EKG performed; foley placed and urine specimen collected and sent to lab for testing

## 2012-09-22 NOTE — ED Provider Notes (Signed)
History     CSN: [id number]  Arrival date & time 09/22/12  7829   First MD Initiated Contact with Patient 09/22/12 1834      Chief Complaint  Patient presents with  . Altered Mental Status    (Consider location/radiation/quality/duration/timing/severity/associated sxs/prior treatment) Patient is a 71 y.o. female presenting with general illness. The history is provided by the EMS personnel. No language interpreter was used.  Illness  The current episode started today. The problem occurs continuously. The problem has been rapidly worsening. The problem is severe. Nothing relieves the symptoms. Nothing aggravates the symptoms.    Past Medical History  Diagnosis Date  . Carotid artery occlusion     bilat CEA in 2012?  . C. 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 Hypertension   . Atrial fibrillation April  2013  . Pulmonary hypertension   . Secondary hyperparathyroidism, renal   . COPD (chronic obstructive pulmonary disease)   . Hypercholesterolemia   . History of MI (myocardial infarction)     "they say I had 2 years ago" (08/20/2012)  . GERD (gastroesophageal reflux disease)   . History of pneumonia     "have it alot" (08/20/2012)  . Sleep apnea     "don't wear mask anymore"  . Type II diabetes mellitus   . History of blood transfusion 2013    "3 times so far in 2013:  4 pints one time; 2 pints another; ?# last time" (08/20/2012)  . Iron deficiency anemia   . Seizures 2012    after carotid surgery  . ESRD on dialysis     "[facility]; Tues, Thurs; Sat"  . Renal cell carcinoma 2005?  Marland Kitchen On home oxygen therapy     "24/7"   . Acute diverticulitis     Oct 2013, treated medically [facility], sigmoid diverticulitits    Past Surgical History  Procedure Date  . Nephrectomy 2005    Right, for Renal cell carcinoma  . Carotid endarterectomy 2012    bilaterally  . Av fistula placement Jan. 7, 2011    Right  upper arm by Dr. Hart [place]  . Hd catheter  Aug. 22, 2013    [facility]  . Appendectomy     childhood  . Abdominal hysterectomy 1971?  Marland Kitchen Coronary angioplasty with stent placement 1990's    "1 total"  . Coronary angioplasty 1990's  . Av fistula repair 2013    right upper arm    Family History  Problem Relation Age of Onset  . Cancer Mother     pancreatic  . Stroke Father   . Coronary artery disease Father   . Cancer Brother     Brain  . Kidney disease Brother     Kidney stones    History  Substance Use Topics  . Smoking status: Former Smoker -- 1.5 packs/day for 45 years    Types: Cigarettes    Quit date: 09/30/2003  . Smokeless tobacco: Never Used  . Alcohol Use: No    OB History    Grav Para Term Preterm Abortions TAB SAB Ect Mult Living                  Review of Systems  Unable to perform ROS Respiratory: Positive for shortness of breath.   All other systems reviewed and are negative.    Allergies  Morphine and related and Lipitor  Home Medications   Current Outpatient Rx  Name  Route  Sig  Dispense  Refill  . ALBUTEROL SULFATE (2.5 MG/3ML) 0.083% IN NEBU   Nebulization   Take 2.5 mg by nebulization every 6 (six) hours as needed. For shortness of breath         . CALCIUM CARBONATE 200 MG PO CAPS   Oral   Take 200 mg by mouth 3 (three) times daily with meals.          Marland Kitchen CARVEDILOL 25 MG PO TABS   Oral   Take 25 mg by mouth daily.         Marland Kitchen FLUTICASONE PROPIONATE  HFA 110 MCG/ACT IN AERO   Inhalation   Inhale 1 puff into the lungs 2 (two) times daily.         . FUROSEMIDE 80 MG PO TABS   Oral   Take 80 mg by mouth daily.          Marland Kitchen ALIGN PO   Oral   Take 1 capsule by mouth daily.         Marland Kitchen ROPINIROLE HCL 1 MG PO TABS   Oral   Take 1 mg by mouth 3 (three) times daily.         Marland Kitchen VANCOMYCIN 50 MG/ML ORAL SOLUTION   Oral   Take 2.5 mLs (125 mg total) by mouth 4 (four) times daily. For 7 days then 125mg  TID for 14days then 125mg  BID for 14 days then 125mg  Daily  for 14days then 125mg  every other day for 14days then stop   25 mL   5     BP 132/62  Pulse 115  Resp 15  SpO2 100%  Physical Exam  Vitals reviewed. Constitutional: She is oriented to person, place, and time. She appears well-developed and well-nourished. She appears distressed.  HENT:  Head: Normocephalic and atraumatic.  Eyes: EOM are normal. Pupils are equal, round, and reactive to light.  Neck: Normal range of motion. Neck supple.  Cardiovascular: Regular rhythm.  Tachycardia present.   Pulmonary/Chest: Effort normal. No respiratory distress. She has decreased breath sounds. She has no wheezes. She has rhonchi. She has rales.  Abdominal: Soft. She exhibits no distension.  Musculoskeletal: Normal range of motion. She exhibits no edema.  Neurological: She is alert and oriented to person, place, and time. GCS eye subscore is 1. GCS verbal subscore is 1. GCS motor subscore is 4.  Skin: Skin is dry. There is cyanosis. There is pallor.    ED Course  INTUBATION Performed by: Audelia Hives Authorized by: Preston Fleeting, DAVID Consent: The procedure was performed in an emergent situation. Indications: airway protection, hypoxemia and respiratory failure Intubation method: video-assisted Patient status: paralyzed (RSI) Preoxygenation: nonrebreather mask and BVM Sedatives: etomidate Paralytic: rocuronium Laryngoscope size: Mac 3 Tube size: 7.5 mm Tube type: cuffed Number of attempts: 1 Cords visualized: yes Post-procedure assessment: chest rise,  ETCO2 monitor and CO2 detector Breath sounds: equal Cuff inflated: yes ETT to lip: 23 cm Tube secured with: ETT holder and adhesive tape Chest x-ray interpreted by me. Chest x-ray findings: endotracheal tube in appropriate position Patient tolerance: Patient tolerated the procedure well with no immediate complications.   (including critical care time)   Labs Reviewed  BLOOD GAS, ARTERIAL  CBC WITH DIFFERENTIAL  COMPREHENSIVE  METABOLIC PANEL  TROPONIN I  URINALYSIS, MICROSCOPIC ONLY  LACTIC ACID, PLASMA  PROTIME-INR   Results for orders placed during the hospital encounter of 09/22/12  CBC WITH DIFFERENTIAL      Component Value Range   WBC 31.3 (*) 4.0 -  10.5 K/uL   RBC 3.16 (*) 3.87 - 5.11 MIL/uL   Hemoglobin 9.8 (*) 12.0 - 15.0 g/dL   HCT 45.4 (*) 09.8 - 11.9 %   MCV 108.2 (*) 78.0 - 100.0 fL   MCH 31.0  26.0 - 34.0 pg   MCHC 28.7 (*) 30.0 - 36.0 g/dL   RDW 14.7 (*) 82.9 - 56.2 %   Platelets    150 - 400 K/uL   Value: PLATELET CLUMPS NOTED ON SMEAR, COUNT APPEARS ADEQUATE   Neutrophils Relative 67  43 - 77 %   Lymphocytes Relative 25  12 - 46 %   Monocytes Relative 4  3 - 12 %   Eosinophils Relative 3  0 - 5 %   Basophils Relative 1  0 - 1 %   Neutro Abs 21.0 (*) 1.7 - 7.7 K/uL   Lymphs Abs 7.8 (*) 0.7 - 4.0 K/uL   Monocytes Absolute 1.3 (*) 0.1 - 1.0 K/uL   Eosinophils Absolute 0.9 (*) 0.0 - 0.7 K/uL   Basophils Absolute 0.3 (*) 0.0 - 0.1 K/uL   RBC Morphology POLYCHROMASIA PRESENT     WBC Morphology WHITE COUNT CONFIRMED ON SMEAR    COMPREHENSIVE METABOLIC PANEL      Component Value Range   Sodium 134 (*) 135 - 145 mEq/L   Potassium 5.9 (*) 3.5 - 5.1 mEq/L   Chloride 95 (*) 96 - 112 mEq/L   CO2 25  19 - 32 mEq/L   Glucose, Bld 201 (*) 70 - 99 mg/dL   BUN 41 (*) 6 - 23 mg/dL   Creatinine, Ser 1.30 (*) 0.50 - 1.10 mg/dL   Calcium 8.6  8.4 - 86.5 mg/dL   Total Protein 7.5  6.0 - 8.3 g/dL   Albumin 2.6 (*) 3.5 - 5.2 g/dL   AST 34  0 - 37 U/L   ALT 12  0 - 35 U/L   Alkaline Phosphatase 109  39 - 117 U/L   Total Bilirubin 0.3  0.3 - 1.2 mg/dL   GFR calc non Af Amer 6 (*) >90 mL/min   GFR calc Af Amer 7 (*) >90 mL/min  TROPONIN I      Component Value Range   Troponin I <0.30  <0.30 ng/mL  URINALYSIS, MICROSCOPIC ONLY      Component Value Range   Color, Urine YELLOW  YELLOW   APPearance CLEAR  CLEAR   Specific Gravity, Urine 1.012  1.005 - 1.030   pH 7.5  5.0 - 8.0   Glucose, UA 250  (*) NEGATIVE mg/dL   Hgb urine dipstick NEGATIVE  NEGATIVE   Bilirubin Urine NEGATIVE  NEGATIVE   Ketones, ur NEGATIVE  NEGATIVE mg/dL   Protein, ur >784 (*) NEGATIVE mg/dL   Urobilinogen, UA 0.2  0.0 - 1.0 mg/dL   Nitrite NEGATIVE  NEGATIVE   Leukocytes, UA NEGATIVE  NEGATIVE   WBC, UA 3-6  <3 WBC/hpf   Bacteria, UA RARE  RARE   Squamous Epithelial / LPF MANY (*) RARE   Urine-Other MUCOUS PRESENT    LACTIC ACID, PLASMA      Component Value Range   Lactic Acid, Venous 1.0  0.5 - 2.2 mmol/L  PROTIME-INR      Component Value Range   Prothrombin Time 13.4  11.6 - 15.2 seconds   INR 1.03  0.00 - 1.49  POCT I-STAT 3, BLOOD GAS (G3+)      Component Value Range   pH, Arterial 7.149 (*) 7.350 - 7.450  pCO2 arterial 89.5 (*) 35.0 - 45.0 mmHg   pO2, Arterial 455.0 (*) 80.0 - 100.0 mmHg   Bicarbonate 31.1 (*) 20.0 - 24.0 mEq/L   TCO2 34  0 - 100 mmol/L   O2 Saturation 100.0     Collection site RADIAL, ALLEN'S TEST ACCEPTABLE     Drawn by Operator     Sample type ARTERIAL     Comment NOTIFIED PHYSICIAN    TROPONIN I      Component Value Range   Troponin I <0.30  <0.30 ng/mL  BLOOD GAS, ARTERIAL      Component Value Range   FIO2 0.50     Delivery systems VENTILATOR     Mode PRESSURE REGULATED VOLUME CONTROL     VT 500     Rate 20     Peep/cpap 5.0     pH, Arterial 7.356  7.350 - 7.450   pCO2 arterial 47.3 (*) 35.0 - 45.0 mmHg   pO2, Arterial 141.0 (*) 80.0 - 100.0 mmHg   Bicarbonate 25.8 (*) 20.0 - 24.0 mEq/L   TCO2 27.3  0 - 100 mmol/L   Acid-Base Excess 1.0  0.0 - 2.0 mmol/L   O2 Saturation 99.5     Patient temperature 98.6     Collection site RIGHT RADIAL     Drawn by 10006     Sample type ARTERIAL     Allens test (pass/fail) PASS  PASS        DG CHEST PORT 1 VIEW (Final result)   Result time:09/22/12 1926    Final result by Rad Results In Interface (09/22/12 19:26:07)    Narrative:   *RADIOLOGY REPORT*  Clinical Data: Endotracheal tube placement, altered mental  status  PORTABLE CHEST - 1 VIEW  Comparison: 08/19/2012; 07/28/2012  Findings:  Grossly unchanged cardiac silhouette and mediastinal contours. Interval intubation with endotracheal tube overlying tracheal air column with tip superior to the carina. Stable positioning of left jugular approach central venous catheter with tips overlying the superior aspect the right atrium. Pulmonary vasculature is indistinct with cephalization of flow. Worsening perihilar heterogeneous opacities. No definite pleural effusion or pneumothorax. Unchanged bones.  IMPRESSION: 1. Endotracheal tube overlies tracheal air column with tip superior to the carina. No pneumothorax. 2. Findings most suggestive of pulmonary edema.   Original Report Authenticated By: Tacey Ruiz, MD     No results found.   No diagnosis found.    MDM  Pt w/ ESRD, CAD, COPD now w/ acute onset dyspnea. Missed dialysis. EMS called and placed on cpap en route. On arrival to ED GCS 6, ext cool, sats mid 90s on NRB, normotensive. PERRLA, lungs globally diminished w/ rales and rhonchi. Intubated for airway protection and hypoxia w/ RSI, IO placed for access. Will check CXR, ECG, CMP, CBC, lactic acid, troponin, u/a, rectal temp, blood gas and coags. Likely volume overload, pulmonary edema.   Course: reassessed, CXR c/w pulm edema, u/a neg, potassium 5.9, ECG - no hyperacute T waves, blood gas c/w resp acidosis - vent rate increased, WBC 31k, placed on fent/versed for sedation. Given 30mg  kayexalate. D/w nephrology and critical care and pt admitted for further care and dialysis.   1. Acute respiratory failure   2. Acute encephalopathy   3. Acute pulmonary edema            Audelia Hives, MD 09/23/12 [phone]

## 2012-09-22 NOTE — ED Notes (Signed)
Intensivist advised of temp > 100 by foley.

## 2012-09-22 NOTE — H&P (Signed)
PULMONARY  / CRITICAL CARE MEDICINE  Name: [name] MRN: [id number] DOB: 07-09-1941    LOS: 0  REFERRING MD:  EMD  CHIEF COMPLAINT:  Acute respiratory failure  BRIEF PATIENT DESCRIPTION: [name] admitted 12/25 with hypertensive emergency, acute pulmonary edema and acute respiratory failure requiring intubation.  LINES / TUBES: OETT 12/25 >>> OGT 12/25 >>> Foley 12/25 >>> R [place] TLC 12/25 - removed as tip pointed cephalad  CULTURES: 12/25  Blood >>> 12/25  Urine >>> 12/25  Respiratory >>>  ANTIBIOTICS: Vancomycin PO 12/25 >>> Flagyl 12/25 >>>  SIGNIFICANT EVENTS:  12/25  Admitted with hypertensive emergency, acute pulmonary edema and acute respiratory failure requiring intubation.  LEVEL OF CARE:  ICU  PRIMARY SERVICE:  PCCM  CONSULTANTS:  Renal (called by EDP)  CODE STATUS:  Full  DIET:  NPO  DVT Px:  Heparin  GI Px:  Protonix  The patient is encephalopathic and unable to provide history, which was obtained for available medical records.  HISTORY OF PRESENT ILLNESS:  [name] admitted 12/25 with hypertensive emergency, acute pulmonary edema and acute respiratory failure requiring intubation.  PAST MEDICAL HISTORY :  Past Medical History  Diagnosis Date  . Carotid artery occlusion     bilat CEA in 2012?  . C. 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 Hypertension   . Atrial fibrillation April  2013  . Pulmonary hypertension   . Secondary hyperparathyroidism, renal   . COPD (chronic obstructive pulmonary disease)   . Hypercholesterolemia   . History of MI (myocardial infarction)     "they say I had 2 years ago" (08/20/2012)  . GERD (gastroesophageal reflux disease)   . History of pneumonia     "have it alot" (08/20/2012)  . Sleep apnea     "don't wear mask anymore"  . Type II diabetes mellitus   . History of blood transfusion 2013    "3 times so far in 2013:  4 pints one time; 2 pints another; ?# last time"  (08/20/2012)  . Iron deficiency anemia   . Seizures 2012    after carotid surgery  . ESRD on dialysis     "[facility]; Tues, Thurs; Sat"  . Renal cell carcinoma 2005?  Marland Kitchen On home oxygen therapy     "24/7"   . Acute diverticulitis     Oct 2013, treated medically [facility], sigmoid diverticulitits   Past Surgical History  Procedure Date  . Nephrectomy 2005    Right, for Renal cell carcinoma  . Carotid endarterectomy 2012    bilaterally  . Av fistula placement Jan. 7, 2011    Right  upper arm by Dr. Hart [place]  . Hd catheter Aug. 22, 2013    [facility]  . Appendectomy     childhood  . Abdominal hysterectomy 1971?  Marland Kitchen Coronary angioplasty with stent placement 1990's    "1 total"  . Coronary angioplasty 1990's  . Av fistula repair 2013    right upper arm   Prior to Admission medications   Medication Sig Start Date End Date Taking? Authorizing Provider  albuterol (PROVENTIL) (2.5 MG/3ML) 0.083% nebulizer solution Take 2.5 mg by nebulization every 6 (six) hours as needed. For shortness of breath    Historical Provider, MD  calcium carbonate 200 MG capsule Take 200 mg by mouth 3 (three) times daily with meals.     Historical Provider, MD  carvedilol (COREG) 25 MG tablet  Take 25 mg by mouth daily.    Historical Provider, MD  fluticasone (FLOVENT HFA) 110 MCG/ACT inhaler Inhale 1 puff into the lungs 2 (two) times daily.    Historical Provider, MD  furosemide (LASIX) 80 MG tablet Take 80 mg by mouth daily.  05/29/12   Historical Provider, MD  Probiotic Product (ALIGN PO) Take 1 capsule by mouth daily.    Historical Provider, MD  rOPINIRole (REQUIP) 1 MG tablet Take 1 mg by mouth 3 (three) times daily.    Historical Provider, MD  vancomycin (VANCOCIN) 50 mg/mL oral solution Take 2.5 mLs (125 mg total) by mouth 4 (four) times daily. For 7 days then 125mg  TID for 14days then 125mg  BID for 14 days then 125mg  Daily for 14days then 125mg  every other day for 14days then stop 08/05/12    Zannie Cove, MD   Allergies  Allergen Reactions  . Morphine And Related Nausea And Vomiting  . Lipitor (Atorvastatin) Other (See Comments)    Causes drowsiness   FAMILY HISTORY:  Family History  Problem Relation Age of Onset  . Cancer Mother     pancreatic  . Stroke Father   . Coronary artery disease Father   . Cancer Brother     Brain  . Kidney disease Brother     Kidney stones   SOCIAL HISTORY:  reports that she quit smoking about 8 years ago. Her smoking use included Cigarettes. She has a 67.5 pack-year smoking history. She has never used smokeless tobacco. She reports that she does not drink alcohol or use illicit drugs.  REVIEW OF SYSTEMS:  Unable to provide.  INTERVAL HISTORY:  VITAL SIGNS: Temp:  [99.9 F (37.7 C)-100 F (37.8 C)] 100 F (37.8 C) (12/25 1915) Pulse Rate:  [112-119] 112  (12/25 1915) Resp:  [15-31] 15  (12/25 1915) BP: (112-177)/(49-92) 154/69 mmHg (12/25 1915) SpO2:  [90 %-100 %] 100 % (12/25 1920) FiO2 (%):  [60 %-100 %] 60 % (12/25 1941) Weight:  [80.3 kg (177 lb 0.5 oz)] 80.3 kg (177 lb 0.5 oz) (12/25 1941)  HEMODYNAMICS:   VENTILATOR SETTINGS: Vent Mode:  [-] PRVC FiO2 (%):  [60 %-100 %] 60 % Set Rate:  [15 bmp-20 bmp] 20 bmp Vt Set:  [500 mL] 500 mL PEEP:  [5 cmH20] 5 cmH20 Plateau Pressure:  [25 cmH20] 25 cmH20  INTAKE / OUTPUT: Intake/Output    None    PHYSICAL EXAMINATION: General:  Appears acutely ill, mechanically ventilated, synchronous Neuro:  Encephalopathic, nonfocal, cough / gag diminished HEENT:  PERRL, OETT / OGT Cardiovascular:  Tachycardic, regular Lungs:  Bilateral diminished air entry, bilateral rales Abdomen:  Soft, nontender, bowel sounds diminished Musculoskeletal:  Moves all extremities Skin:  Intact  LABS:  Lab 09/22/12 1935 09/22/12 1910 09/22/12 1846 09/22/12 1845  HGB -- -- -- 9.8*  WBC -- -- -- 31.3*  PLT -- -- -- PLATELET CLUMPS NOTED ON SMEAR, COUNT APPEARS ADEQUATE  NA -- -- -- 134*  K --  -- -- 5.9*  CL -- -- -- 95*  CO2 -- -- -- 25  GLUCOSE -- -- -- 201*  BUN -- -- -- 41*  CREATININE -- -- -- 6.25*  CALCIUM -- -- -- 8.6  MG -- -- -- --  PHOS -- -- -- --  AST -- -- -- 34  ALT -- -- -- 12  ALKPHOS -- -- -- 109  BILITOT -- -- -- 0.3  PROT -- -- -- 7.5  ALBUMIN -- -- -- 2.6*  APTT -- -- -- --  INR -- 1.03 -- --  LATICACIDVEN -- -- 1.0 --  TROPONINI -- -- <0.30 --  PROCALCITON -- -- -- --  PROBNP -- -- -- --  O2SATVEN -- -- -- --  PHART 7.149* -- -- --  PCO2ART 89.5* -- -- --  PO2ART 455.0* -- -- --   No results found for this basename: GLUCAP:5 in the last 168 hours  IMAGING: 12/25  PCXR >>> Pulmonary edema, ETT good position, R [place] TLC tip pointing cephalad  ECG: 12/25  12-lead ECG >>> Sinus tachycardia, no ST-T changes  DIAGNOSES: Active Problems:  End stage renal disease  DM2 (diabetes mellitus, type 2)  Anemia  COPD (chronic obstructive pulmonary disease)  Leukocytosis  Acute respiratory failure  Acute encephalopathy  Acute pulmonary edema  C. difficile colitis  ASSESSMENT / PLAN:  PULMONARY  A:  Acute respiratory failure in setting of hypertensive emergency with acute pulmonary edema.  COPD without exacerbation. P:   Gaol SpO2>92, pH>7.30 Full mechanical support Daily SBT Trend ABG / CXR Combivent Flovent when able  CARDIOVASCULAR  A:  Hypertensive emergency. P:  Goal SBP < 180, DBP < 110 Nitroglycerin gtt PRN Restart Coreg, Zetia when stable Trend Troponin  RENAL  A:  ESRD on HD.  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  Making urine.  Hyperkalemia, likely secondary to acidosis. P:   Lasix 80 q12h Kayexalate given by EDP Calcium gluconate Trend BMP Renal aware Calcium acetate when takes PO  GASTROINTESTINAL  A:  No active issues. P:   NPO as intubated TF if remains intubated > 24 hours  HEMATOLOGIC  A:  Anemia, likely of renal disease. P:  Trend CBC  INFECTIOUS  A:  Recurrent C. Difficile  infection, likely did not finish the course - ID note dated 11/27 recommends 63 more days of Vancomycin PO taper, but per family patient stopped).  No overt signs of other infectious source otherwise. P:   Restart C. Diff regimen (Flagyl / Vanco) Defer antibiotics otherwise Panculture PCT May need ID consult Trend WBC  ENDOCRINE   A:  DM.  Hyperglycemia. P:   SSI  NEUROLOGIC  A:  Acute encephalopathy. P:   Goal RASS 0 to -1 Propofol gtt Requip when takes PO  CLINICAL SUMMARY: [name] admitted 12/25 with hypertensive emergency, acute pulmonary edema and acute respiratory failure requiring intubation.  Renal aware.  Lasix for now (making urine).  Nitroglycerin PRN.  Leukocytosis likely secondary to undertreated C. Dif - will restart Vanco and add Flagyl.  May need ID consult.  Hyperkalemia is likely to improve with correction of acidosis - will recheck ABG. SSI for DM.  I have personally obtained a history, examined the patient, evaluated laboratory and imaging results, formulated the assessment and plan and placed orders.  CRITICAL CARE:  The patient is critically ill with multiple organ systems failure and requires high complexity decision making for assessment and support, frequent evaluation and titration of therapies, application of advanced monitoring technologies and extensive interpretation of multiple databases. Critical Care Time devoted to patient care services described in this note is 60 minutes.   Lonia Farber, MD  Pulmonary and Critical Care Medicine [facility] Pager: [phone]  09/22/2012, 9:07 PM

## 2012-09-22 NOTE — Procedures (Signed)
Name:  TENNYSON KALLEN MRN:  [id number] DOB:  [birth year]  PROCEDURE NOTE  Procedure:  Central venous catheter placement.  Indications:  Need for intravenous access and hemodynamic monitoring.  Consent:  Consent was implied due to the emergency nature of the procedure.  Anesthesia:  A total of 10 mL of 1% Lidocaine was used for local infiltration anesthesia.  Procedure summary:  Appropriate equipment was assembled.  The patient was identified as Production assistant, radio and safety timeout was performed. The patient was placed in Trendelenburg position.  Sterile technique was used. The patient's right anterior chest wall was prepped using chlorhexidine / alcohol scrub and the field was draped in usual sterile fashion with full body drape. After the adequate anesthesia was achieved, the right subclavian vein was cannulated with the introducer needle without difficulty. A guide wire was advanced through the introducer needle, which was then withdrawn. A small skin incision was made at the point of wire entry, the dilator was inserted over the guide wire and appropriate dilation was obtained. The dilator was removed and triple-lumen catheter was advanced over the guide wire, which was then removed.  All ports were aspirated and flushed with normal saline without difficulty. The catheter was secured into place. Antibiotic patch was placed and sterile dressing was applied. Post-procedure chest x-ray was ordered.  Complications:  No immediate complications were noted.  Hemodynamic parameters and oxygenation remained stable throughout the procedure.  Estimated blood loss:  Less then 5 mL.  Orlean Bradford, M.D. Pulmonary and Critical Care Medicine [facility] Cell: [phone]  09/22/2012, 10:09 PM

## 2012-09-22 NOTE — Progress Notes (Signed)
R [place] CVL noted pointing cephalad into R IJ, removed.

## 2012-09-22 NOTE — Progress Notes (Signed)
Chaplain visited with patient's family and provided support and assistance in moving from ED to 2300 waiting room. Family appeared to be coping very well.  Chaplain also acted as Print production planner between family and staff. Will recommend follow up by unit chaplain.  Rutherford Nail Chaplain

## 2012-09-23 ENCOUNTER — Inpatient Hospital Stay (HOSPITAL_COMMUNITY): Payer: Medicare Other

## 2012-09-23 ENCOUNTER — Encounter (HOSPITAL_COMMUNITY): Payer: Self-pay | Admitting: *Deleted

## 2012-09-23 DIAGNOSIS — I059 Rheumatic mitral valve disease, unspecified: Secondary | ICD-10-CM

## 2012-09-23 LAB — GLUCOSE, CAPILLARY
Glucose-Capillary: 155 mg/dL — ABNORMAL HIGH (ref 70–99)
Glucose-Capillary: 103 mg/dL — ABNORMAL HIGH (ref 70–99)
Glucose-Capillary: 102 mg/dL — ABNORMAL HIGH (ref 70–99)
Glucose-Capillary: 87 mg/dL (ref 70–99)
Glucose-Capillary: 101 mg/dL — ABNORMAL HIGH (ref 70–99)

## 2012-09-23 LAB — IRON AND TIBC
Iron: 23 ug/dL — ABNORMAL LOW (ref 42–135)
TIBC: 210 ug/dL — ABNORMAL LOW (ref 250–470)

## 2012-09-23 LAB — CBC
HCT: 26.1 % — ABNORMAL LOW (ref 36.0–46.0)
HCT: 35.9 % — ABNORMAL LOW (ref 36.0–46.0)
Hemoglobin: 11 g/dL — ABNORMAL LOW (ref 12.0–15.0)
MCH: 30.8 pg (ref 26.0–34.0)
MCH: 30.2 pg (ref 26.0–34.0)
MCHC: 30.6 g/dL (ref 30.0–36.0)
MCV: 105.7 fL — ABNORMAL HIGH (ref 78.0–100.0)
MCV: 98.6 fL (ref 78.0–100.0)
Platelets: 188 10*3/uL (ref 150–400)
RBC: 2.47 MIL/uL — ABNORMAL LOW (ref 3.87–5.11)
WBC: 14.2 10*3/uL — ABNORMAL HIGH (ref 4.0–10.5)

## 2012-09-23 LAB — BLOOD GAS, ARTERIAL
Acid-Base Excess: 1 mmol/L (ref 0.0–2.0)
Bicarbonate: 25.8 mEq/L — ABNORMAL HIGH (ref 20.0–24.0)
MECHVT: 500 mL
PEEP: 5 cmH2O
Patient temperature: 98.6
RATE: 20 resp/min
TCO2: 27.3 mmol/L (ref 0–100)
pCO2 arterial: 47.3 mmHg — ABNORMAL HIGH (ref 35.0–45.0)

## 2012-09-23 LAB — PREPARE RBC (CROSSMATCH)

## 2012-09-23 LAB — BASIC METABOLIC PANEL
BUN: 46 mg/dL — ABNORMAL HIGH (ref 6–23)
BUN: 14 mg/dL (ref 6–23)
CO2: 24 mEq/L (ref 19–32)
Calcium: 8.8 mg/dL (ref 8.4–10.5)
Chloride: 100 mEq/L (ref 96–112)
Creatinine, Ser: 6.89 mg/dL — ABNORMAL HIGH (ref 0.50–1.10)
GFR calc non Af Amer: 14 mL/min — ABNORMAL LOW (ref >90)
Glucose, Bld: 111 mg/dL — ABNORMAL HIGH (ref 70–99)
Glucose, Bld: 91 mg/dL (ref 70–99)
Potassium: 3.7 mEq/L (ref 3.5–5.1)

## 2012-09-23 LAB — PATHOLOGIST SMEAR REVIEW

## 2012-09-23 LAB — VITAMIN B12: Vitamin B-12: 530 pg/mL (ref 211–911)

## 2012-09-23 LAB — TROPONIN I
Troponin I: 0.33 ng/mL (ref <0.30)
Troponin I: 0.4 ng/mL (ref <0.30)

## 2012-09-23 MED ORDER — MIDAZOLAM HCL 2 MG/2ML IJ SOLN: 1.0000 mg | INTRAMUSCULAR | Status: DC | PRN

## 2012-09-23 MED ORDER — ROPINIROLE HCL 1 MG PO TABS
1.0000 mg | ORAL_TABLET | Freq: Two times a day (BID) | ORAL | Status: DC
  Administered 2012-09-23 – 2012-09-25 (×5): 1 mg
  Filled 2012-09-23 (×7): qty 1

## 2012-09-23 MED ORDER — FENTANYL CITRATE 0.05 MG/ML IJ SOLN
25.0000 ug | INTRAMUSCULAR | Status: DC | PRN
  Filled 2012-09-23: qty 2

## 2012-09-23 MED ORDER — CHLORHEXIDINE GLUCONATE CLOTH 2 % EX PADS: 6.0000 | MEDICATED_PAD | Freq: Every day | CUTANEOUS | Status: DC

## 2012-09-23 MED ORDER — IPRATROPIUM-ALBUTEROL 18-103 MCG/ACT IN AERO
2.0000 | INHALATION_SPRAY | Freq: Four times a day (QID) | RESPIRATORY_TRACT | Status: DC
Start: 2012-09-23 — End: 2012-09-25
  Administered 2012-09-23 – 2012-09-25 (×6): 2 via RESPIRATORY_TRACT

## 2012-09-23 MED ORDER — IPRATROPIUM-ALBUTEROL 18-103 MCG/ACT IN AERO
2.0000 | INHALATION_SPRAY | RESPIRATORY_TRACT | Status: DC | PRN
  Filled 2012-09-23: qty 14.7

## 2012-09-23 MED ORDER — IPRATROPIUM-ALBUTEROL 18-103 MCG/ACT IN AERO
6.0000 | INHALATION_SPRAY | Freq: Four times a day (QID) | RESPIRATORY_TRACT | Status: DC
  Filled 2012-09-23: qty 14.7

## 2012-09-23 MED ORDER — MUPIROCIN 2 % EX OINT
1.0000 "application " | TOPICAL_OINTMENT | Freq: Two times a day (BID) | CUTANEOUS | Status: DC
  Administered 2012-09-23 – 2012-09-24 (×3): 1 via NASAL
  Filled 2012-09-23: qty 22

## 2012-09-23 MED ORDER — SODIUM CHLORIDE 0.9 % IV SOLN
INTRAVENOUS | Status: DC
  Administered 2012-09-23: via INTRAVENOUS

## 2012-09-23 MED ORDER — OXYCODONE-ACETAMINOPHEN 5-325 MG PO TABS
1.0000 | ORAL_TABLET | Freq: Once | ORAL | Status: AC
  Administered 2012-09-23: 1 via ORAL
  Filled 2012-09-23: qty 1

## 2012-09-23 MED ORDER — ASPIRIN 81 MG PO CHEW
324.0000 mg | CHEWABLE_TABLET | Freq: Every day | ORAL | Status: DC
  Administered 2012-09-23 – 2012-09-24 (×2): 324 mg via NASOGASTRIC
  Filled 2012-09-23: qty 1
  Filled 2012-09-23: qty 4
  Filled 2012-09-23: qty 3

## 2012-09-23 MED ORDER — MIDAZOLAM HCL 2 MG/2ML IJ SOLN
2.0000 mg | Freq: Once | INTRAMUSCULAR | Status: AC
  Administered 2012-09-23: 2 mg via INTRAVENOUS
  Filled 2012-09-23: qty 2

## 2012-09-23 MED ORDER — CARVEDILOL 12.5 MG PO TABS
12.5000 mg | ORAL_TABLET | Freq: Two times a day (BID) | ORAL | Status: DC
  Administered 2012-09-24: 12.5 mg via ORAL
  Filled 2012-09-23 (×3): qty 1

## 2012-09-23 NOTE — Progress Notes (Signed)
eLink Physician-Brief Progress Note Patient Name: [name] DOB: April 30, 1941 MRN: [id number]  Date of Service  09/23/2012   HPI/Events of Note  Patient c/o of pain at IO infusion site. CVL placed earlier malpositioned and removed.  Can take meds via NGT.   eICU Interventions  Plan: One time dose of percocet 5/325 with repeat times one if now relief.  Discuss with rounding PCCM other line options.   Intervention Category Intermediate Interventions: Pain - evaluation and management  DETERDING,ELIZABETH 09/23/2012, 12:11 AM

## 2012-09-23 NOTE — Progress Notes (Signed)
I have seen and examined this patient and agree with the plan of care. Patient seen on dialysis [facility] W 09/23/2012, 10:49 AM

## 2012-09-23 NOTE — Progress Notes (Signed)
  Echocardiogram 2D Echocardiogram has been performed.  Ellender Hose A 09/23/2012, 3:08 PM

## 2012-09-23 NOTE — H&P (Signed)
PULMONARY  / CRITICAL CARE MEDICINE  Name: [name] MRN: [id number] DOB: 1940-12-19    LOS: 1  REFERRING MD:  EMD  CHIEF COMPLAINT:  Acute respiratory failure  BRIEF PATIENT DESCRIPTION: [name] on HD admitted 12/25 with hypertensive emergency, acute pulmonary edema and acute respiratory failure requiring intubation.  LINES / TUBES: OETT 12/25 >>>extubated 09/23/12 OGT 12/25 >>>removed 09/23/12 Foley 12/25 >>> R [place] TLC 12/25 - removed as tip pointed cephalad  CULTURES: 12/25  Blood >>> 12/25  Urine >>> 12/25  Respiratory >>>  ANTIBIOTICS: Vancomycin PO 12/25 >>> Flagyl 12/25 >>>  SIGNIFICANT EVENTS:  12/25  Admitted with hypertensive emergency, acute pulmonary edema and acute respiratory failure requiring intubation.  LEVEL OF CARE:  ICU  PRIMARY SERVICE:  PCCM  CONSULTANTS:  Renal (called by EDP)  CODE STATUS:  Full  DIET:  NPO  DVT Px:  Heparin  GI Px:  Protonix  The patient is encephalopathic and unable to provide history, which was obtained for available medical records.  HISTORY OF PRESENT ILLNESS:  [name] on HD admitted 12/25 with hypertensive emergency, acute pulmonary edema and acute respiratory failure requiring intubation.  PAST MEDICAL HISTORY :  Past Medical History  Diagnosis Date  . Carotid artery occlusion     bilat CEA in 2012?  . C. 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 Hypertension   . Atrial fibrillation April  2013  . Pulmonary hypertension   . Secondary hyperparathyroidism, renal   . COPD (chronic obstructive pulmonary disease)   . Hypercholesterolemia   . History of MI (myocardial infarction)     "they say I had 2 years ago" (08/20/2012)  . GERD (gastroesophageal reflux disease)   . History of pneumonia     "have it alot" (08/20/2012)  . Sleep apnea     "don't wear mask anymore"  . Type II diabetes mellitus   . History of blood transfusion 2013    "3 times so far in 2013:  4 pints one time; 2  pints another; ?# last time" (08/20/2012)  . Iron deficiency anemia   . Seizures 2012    after carotid surgery  . Cook on dialysis     "[place]; Tues, Thurs; Sat"  . Renal cell carcinoma 2005?  Marland Kitchen On home oxygen therapy     "24/7"   . Acute diverticulitis     Oct 2013, treated medically [facility], sigmoid diverticulitits     Allergies  Allergen Reactions  . Morphine And Related Nausea And Vomiting  . Lipitor (Atorvastatin) Other (See Comments)    Causes drowsiness   FAMILY HISTORY:  Family History  Problem Relation Age of Onset  . Cancer Mother     pancreatic  . Stroke Father   . Coronary artery disease Father   . Cancer Brother     Brain  . Kidney disease Brother     Kidney stones     VITAL SIGNS: Temp:  [96.8 F (36 C)-100.2 F (37.9 C)] 98.7 F (37.1 C) (12/26 1240) Pulse Rate:  [29-128] 126  (12/26 1420) Resp:  [10-31] 24  (12/26 1420) BP: (86-200)/(43-93) 138/71 mmHg (12/26 1400) SpO2:  [90 %-100 %] 97 % (12/26 1420) FiO2 (%):  [40 %-100 %] 40 % (12/26 1245) Weight:  [79.2 kg (174 lb 9.7 oz)-82.4 kg (181 lb 10.5 oz)] 79.2 kg (174 lb 9.7 oz) (12/26 1145)  HEMODYNAMICS:   VENTILATOR SETTINGS: Vent Mode:  [-] PSV FiO2 (%):  [  40 %-100 %] 40 % Set Rate:  [15 bmp-20 bmp] 20 bmp Vt Set:  [500 mL] 500 mL PEEP:  [5 cmH20] 5 cmH20 Pressure Support:  [5 cmH20] 5 cmH20 Plateau Pressure:  [18 cmH20-25 cmH20] 18 cmH20  INTAKE / OUTPUT: Intake/Output      12/25 0701 - 12/26 0700 12/26 0701 - 12/27 0700   I.V. (mL/kg) 142.9 (1.7) 80 (1)   NG/GT 120 60   IV Piggyback 130    Total Intake(mL/kg) 392.9 (4.8) 140 (1.8)   Urine (mL/kg/hr) 110 (0.1) 25 (0)   Emesis/NG output 100    Other  3314   Total Output 210 3339   Net +182.9 -3199         PHYSICAL EXAMINATION: General:  Appears acutely ill, mechanically ventilated, synchronous Neuro:  Alert and appropriate, following comends, nonfocal, cough / gag present and appropriate HEENT:  PERRL, OETT /  OGT Cardiovascular:  Tachycardic, regular Lungs:  Bilateral diminished air entry, no crackles or rhonchi Abdomen:  Soft, nontender, normal bowel sounds  Musculoskeletal:  Moves all extremities Skin:  Intact  LABS:  Lab 09/23/12 0911 09/23/12 0316 09/23/12 0315 09/23/12 0311 09/23/12 0055 09/22/12 2122 09/22/12 1935 09/22/12 1910 09/22/12 1846 09/22/12 1845  HGB -- -- 7.6* -- -- -- -- -- -- 9.8*  WBC -- -- 14.2* -- -- -- -- -- -- 31.3*  PLT -- -- 188 -- -- -- -- -- -- PLATELET CLUMPS NOTED ON SMEAR, COUNT APPEARS ADEQUATE  NA -- -- 139 -- -- -- -- -- -- 134*  K -- -- 4.4 -- -- -- -- -- -- 5.9*  CL -- -- 100 -- -- -- -- -- -- 95*  CO2 -- -- 24 -- -- -- -- -- -- 25  GLUCOSE -- -- 111* -- -- -- -- -- -- 201*  BUN -- -- 46* -- -- -- -- -- -- 41*  CREATININE -- -- 6.89* -- -- -- -- -- -- 6.25*  CALCIUM -- -- 8.8 -- -- -- -- -- -- 8.6  MG -- -- 2.4 -- -- -- -- -- -- --  PHOS -- -- 6.3* -- -- -- -- -- -- --  AST -- -- -- -- -- -- -- -- -- 34  ALT -- -- -- -- -- -- -- -- -- 12  ALKPHOS -- -- -- -- -- -- -- -- -- 109  BILITOT -- -- -- -- -- -- -- -- -- 0.3  PROT -- -- -- -- -- -- -- -- -- 7.5  ALBUMIN -- -- -- -- -- -- -- -- -- 2.6*  APTT -- -- -- -- -- -- -- -- -- --  INR -- -- -- -- -- -- -- 1.03 -- --  LATICACIDVEN -- -- -- -- -- -- -- -- 1.0 --  TROPONINI 0.40* -- -- 0.33* -- <0.30 -- -- -- --  PROCALCITON -- 2.63 -- -- -- -- -- -- -- --  PROBNP -- -- -- -- -- -- -- -- -- --  O2SATVEN -- -- -- -- -- -- -- -- -- --  PHART -- -- -- -- 7.356 -- 7.149* -- -- --  PCO2ART -- -- -- -- 47.3* -- 89.5* -- -- --  PO2ART -- -- -- -- 141.0* -- 455.0* -- -- --    Lab 09/23/12 1237 09/23/12 0829 09/23/12 0359 09/22/12 2359  GLUCAP 96 102* 103* 155*    IMAGING: 12/25  PCXR >>> Pulmonary edema, ETT good position,   ECG:  12/25  12-lead ECG >>> Sinus tachycardia, no ST-T changes  DIAGNOSES: Active Problems:  End stage renal disease  DM2 (diabetes mellitus, type 2)  Anemia  COPD  (chronic obstructive pulmonary disease)  Leukocytosis  Acute respiratory failure  Acute encephalopathy  Acute pulmonary edema  C. difficile colitis  ASSESSMENT / PLAN:  PULMONARY  A:  Acute respiratory failure in setting of hypertensive emergency with acute pulmonary edema.  COPD without exacerbation. P:   Gaol SpO2>92, pH>7.30 Full mechanical support  SBT today post HD Trend ABG / CXR Combivent Flovent when able  CARDIOVASCULAR  A:  Hypertensive emergency. P:  Goal SBP < 180, DBP < 110 Nitroglycerin gtt discontinued Restart Coreg, Zetia when stable Trend Troponin  RENAL  A:  Cook on HD.  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  Making urine.  Hyperkalemia, likely secondary to acidosis. P:   HD performed today Calcium gluconate Trend BMP Renal consult appreciate input and help   GASTROINTESTINAL  A:  No active issues. P:   NPO as intubated TF if remains intubated > 24 hours  HEMATOLOGIC  A:  Anemia, likely of renal disease. P:  Repeat CBC in pm; Anemia studies suggestive of anemia of chronic disease with a possible acute componenet of iron deficiency; no overt evidence of bleeding Transfused with HD  INFECTIOUS  A:  Recurrent C. Difficile infection, likely did not finish the course - ID note dated 11/27 recommends 63 more days of Vancomycin PO taper, but per family patient stopped).  No overt signs of other infectious source otherwise. P:   Restart C. Diff regimen (Flagyl / Vanco) Defer antibiotics otherwise Panculture PCT May need ID consult Trend WBC  ENDOCRINE   A:  DM.  Hyperglycemia. P:   SSI  NEUROLOGIC  A:  Acute encephalopathy. P:   Resolved   CLINICAL SUMMARY: 71 yo Cook on HD admitted 12/25 with hypertensive emergency, acute pulmonary edema and acute respiratory failure requiring intubation.  Now encephalopathy resolved, blood pressure within normal range.  Leukocytosis likely secondary to undertreated C. Dif - will restart  Vanco and add Flagyl.  May need ID consult.    I have personally obtained a history, examined the patient, evaluated laboratory and imaging results, formulated the assessment and plan and placed orders.  CRITICAL CARE:  The patient is critically ill with multiple organ systems failure and requires high complexity decision making for assessment and support, frequent evaluation and titration of therapies, application of advanced monitoring technologies and extensive interpretation of multiple databases. Critical Care Time devoted to patient care services described in this note is 60 minutes.   Vanetta Mulders, MD  Pulmonary and Critical Care Medicine [facility] Pager: [phone]  09/23/2012, 3:35 PM

## 2012-09-23 NOTE — Procedures (Signed)
Extubation Procedure Note  Patient Details:   Name: LYNNZIE BLACKSON DOB: 1941/07/10 MRN: [id number]   Airway Documentation:     Evaluation  O2 sats: stable throughout Complications: No apparent complications Patient did tolerate procedure well. Bilateral Breath Sounds: Clear;Diminished Suctioning: Oral;Airway Yes  Order to extubate per MD. Pt positive for cuff leak, extubated to 5lpm [place], no strider heard. Pt resting comfortably at this time. RT will continue to monitor.   Parke Poisson 09/23/2012, 2:21 PM

## 2012-09-23 NOTE — Progress Notes (Signed)
eLink Physician-Brief Progress Note Patient Name: [name] DOB: 12-14-1940 MRN: [id number]  Date of Service  09/23/2012   HPI/Events of Note  Cardiac enzymes trending up in paitent with DM/ESRD/COPD   eICU Interventions  Plan: Add ASA  On BB at home restart ASAP after dialysis F/U on previous cardiac studies Continue to trend enzymes.   Intervention Category Intermediate Interventions: Other: (positive cardiac enzymes)  Trinty Marken 09/23/2012, 4:44 AM

## 2012-09-23 NOTE — Plan of Care (Signed)
Problem: Phase I Progression Outcomes Goal: Patient tolerating nututrition at goal Outcome: Not Progressing Currently NPO, no nutrition ordered as of yet

## 2012-09-23 NOTE — Progress Notes (Signed)
INITIAL NUTRITION ASSESSMENT  DOCUMENTATION CODES Per approved criteria  -Not Applicable   INTERVENTION:  If EN started, recommend Nepro formula -- initiate at 15 ml/hr and increase by 10 ml in 4 hours to goal rate of 25 ml/hr with Prostat liquid protein 4 times daily via tube to provide 1480 total kcals, 109 gm protein, 418 ml of free water  Recommend RENA-VIT daily RD to follow for nutrition care plan  NUTRITION DIAGNOSIS: Inadequate oral intake related to inability to eat as evidenced by NPO status  Goal: Initiate EN support within 24 hours if extubation not expected  Monitor:  EN initiation, respiratory status, weight, labs, I/O's  Reason for Assessment: Malnutrition Screening Tool Report, VDRF  71 y.o. female  Admitting Dx:  Acute respiratory failure, ESRD  ASSESSMENT: Patient is currently intubated on ventilator support MV: 6.6 Temp: 37.1 Propofol ---> discontinued 0146 this AM  Patient with ESRD on HD admitted 12/25 with hypertensive emergency, acute pulmonary edema and acute respiratory failure requiring intubation; OGT in place; dialyzes in [place]; reportedly she missed HD; per nutrition screen, patient has been eating poorly because of a decreased appetite.  Height: Ht Readings from Last 1 Encounters:  09/22/12 5' 4.17" (1.63 m)    Weight: Wt Readings from Last 1 Encounters:  09/23/12 174 lb 9.7 oz (79.2 kg)    Ideal Body Weight: 54.5 kg  % Ideal Body Weight: 145%  Wt Readings from Last 10 Encounters:  09/23/12 174 lb 9.7 oz (79.2 kg)  08/25/12 177 lb (80.287 kg)  08/24/12 177 lb (80.287 kg)  08/21/12 170 lb 6.7 oz (77.3 kg)  08/05/12 169 lb 8.5 oz (76.9 kg)  07/07/12 177 lb (80.287 kg)  07/07/12 177 lb (80.287 kg)  06/21/12 178 lb 14.4 oz (81.149 kg)    Usual Body Weight: 177 lb  % Usual Body Weight: 98%  BMI:  Body mass index is 29.81 kg/(m^2).  Estimated Nutritional Needs: Kcal: 1440-1540 Protein: 105-115 gm Fluid: per MD  Skin:  abrasions to face & shoulder  Diet Order: NPO  EDUCATION NEEDS: -No education needs identified at this time   Intake/Output Summary (Last 24 hours) at 09/23/12 1347 Last data filed at 09/23/12 1200  Gross per 24 hour  Intake  502.9 ml  Output   3549 ml  Net -3046.1 ml    Last BM: 12/23  Labs:   Lab 09/23/12 0315 09/22/12 1845  NA 139 134*  K 4.4 5.9*  CL 100 95*  CO2 24 25  BUN 46* 41*  CREATININE 6.89* 6.25*  CALCIUM 8.8 8.6  MG 2.4 --  PHOS 6.3* --  GLUCOSE 111* 201*    CBG (last 3)   Basename 09/23/12 1237 09/23/12 0829 09/23/12 0359  GLUCAP 96 102* 103*    Scheduled Meds:   . albuterol-ipratropium  6 puff Inhalation Q4H  . antiseptic oral rinse  1 application Mouth Rinse QID  . aspirin  324 mg Per NG tube Daily  . chlorhexidine  15 mL Mouth/Throat BID  . heparin subcutaneous  5,000 Units Subcutaneous Q8H  . insulin aspart  0-15 Units Subcutaneous Q4H  . pantoprazole (PROTONIX) IV  40 mg Intravenous Q24H  . rOPINIRole  1 mg Per Tube BID  . vancomycin  125 mg Oral QID   Followed by  . vancomycin  125 mg Oral BID   Followed by  . vancomycin  125 mg Oral Daily   Followed by  . vancomycin  125 mg Oral QODAY   Followed by  .  vancomycin  125 mg Oral Q3 days    Continuous Infusions:   . sodium chloride 10 mL/hr at 09/23/12 0355    Past Medical History  Diagnosis Date  . Carotid artery occlusion     bilat CEA in 2012?  . C. 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 Hypertension   . Atrial fibrillation April  2013  . Pulmonary hypertension   . Secondary hyperparathyroidism, renal   . COPD (chronic obstructive pulmonary disease)   . Hypercholesterolemia   . History of MI (myocardial infarction)     "they say I had 2 years ago" (08/20/2012)  . GERD (gastroesophageal reflux disease)   . History of pneumonia     "have it alot" (08/20/2012)  . Sleep apnea     "don't wear mask anymore"  . Type II diabetes mellitus   . History  of blood transfusion 2013    "3 times so far in 2013:  4 pints one time; 2 pints another; ?# last time" (08/20/2012)  . Iron deficiency anemia   . Seizures 2012    after carotid surgery  . ESRD on dialysis     "[place]; Tues, Thurs; Sat"  . Renal cell carcinoma 2005?  Marland Kitchen On home oxygen therapy     "24/7"   . Acute diverticulitis     Oct 2013, treated medically [facility], sigmoid diverticulitits    Past Surgical History  Procedure Date  . Nephrectomy 2005    Right, for Renal cell carcinoma  . Carotid endarterectomy 2012    bilaterally  . Av fistula placement Jan. 7, 2011    Right  upper arm by Dr. Hart Rochester  . Hd catheter Aug. 22, 2013    [facility]  . Appendectomy     childhood  . Abdominal hysterectomy 1971?  Marland Kitchen Coronary angioplasty with stent placement 1990's    "1 total"  . Coronary angioplasty 1990's  . Av fistula repair 2013    right upper arm    Kirkland Hun, RD, LDN Pager #: [phone] After-Hours Pager #: [phone]

## 2012-09-23 NOTE — Consult Note (Signed)
[name] is an 71 y.o. female referred by Dr Blima Dessert   Chief Complaint: ESRD, pulm edema, hyperkalemia HPI: 71 yo WF admitted with pulm edema requiring intubation so no detailed history obtainable.  Reportedly she missed HD.  She dialyzes in [place].  Past Medical History  Diagnosis Date  . Carotid artery occlusion     bilat CEA in 2012?  . C. 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 Hypertension   . Atrial fibrillation April  2013  . Pulmonary hypertension   . Secondary hyperparathyroidism, renal   . COPD (chronic obstructive pulmonary disease)   . Hypercholesterolemia   . History of MI (myocardial infarction)     "they say I had 2 years ago" (08/20/2012)  . GERD (gastroesophageal reflux disease)   . History of pneumonia     "have it alot" (08/20/2012)  . Sleep apnea     "don't wear mask anymore"  . Type II diabetes mellitus   . History of blood transfusion 2013    "3 times so far in 2013:  4 pints one time; 2 pints another; ?# last time" (08/20/2012)  . Iron deficiency anemia   . Seizures 2012    after carotid surgery  . ESRD on dialysis     "[place]; Tues, Thurs; Sat"  . Renal cell carcinoma 2005?  Marland Kitchen On home oxygen therapy     "24/7"   . Acute diverticulitis     Oct 2013, treated medically [facility], sigmoid diverticulitits    Past Surgical History  Procedure Date  . Nephrectomy 2005    Right, for Renal cell carcinoma  . Carotid endarterectomy 2012    bilaterally  . Av fistula placement Jan. 7, 2011    Right  upper arm by Dr. Hart Rochester  . Hd catheter Aug. 22, 2013    [facility]  . Appendectomy     childhood  . Abdominal hysterectomy 1971?  Marland Kitchen Coronary angioplasty with stent placement 1990's    "1 total"  . Coronary angioplasty 1990's  . Av fistula repair 2013    right upper arm    Family History  Problem Relation Age of Onset  . Cancer Mother     pancreatic  . Stroke Father   . Coronary artery disease Father    . Cancer Brother     Brain  . Kidney disease Brother     Kidney stones   Social History:  reports that she quit smoking about 8 years ago. Her smoking use included Cigarettes. She has a 67.5 pack-year smoking history. She has never used smokeless tobacco. She reports that she does not drink alcohol or use illicit drugs.  Allergies:  Allergies  Allergen Reactions  . Morphine And Related Nausea And Vomiting  . Lipitor (Atorvastatin) Other (See Comments)    Causes drowsiness    Medications Prior to Admission  Medication Sig Dispense Refill  . albuterol (PROVENTIL) (2.5 MG/3ML) 0.083% nebulizer solution Take 2.5 mg by nebulization every 6 (six) hours as needed. For shortness of breath      . calcium acetate, Phos Binder, (PHOSLYRA) 667 MG/5ML SOLN Take 1,334 mg by mouth 4 (four) times daily.      . carvedilol (COREG) 25 MG tablet Take 25 mg by mouth 2 (two) times daily with a meal.      . ezetimibe (ZETIA) 10 MG tablet Take 10 mg by mouth daily.      . fluticasone (FLOVENT HFA) 110 MCG/ACT inhaler  Inhale 1 puff into the lungs 2 (two) times daily.      Marland Kitchen rOPINIRole (REQUIP) 1 MG tablet Take 1 mg by mouth 2 (two) times daily.         Lab Results: UA: 3-6 wbc,> 300 protein  Basename 09/23/12 0315 09/22/12 1845  WBC 14.2* 31.3*  HGB 7.6* 9.8*  HCT 26.1* 34.2*  PLT 188 PLATELET CLUMPS NOTED ON SMEAR, COUNT APPEARS ADEQUATE   BMET  Basename 09/23/12 0315 09/22/12 1845  NA 139 134*  K 4.4 5.9*  CL 100 95*  CO2 24 25  GLUCOSE 111* 201*  BUN 46* 41*  CREATININE 6.89* 6.25*  CALCIUM 8.8 8.6  PHOS 6.3* --   LFT  Basename 09/22/12 1845  PROT 7.5  ALBUMIN 2.6*  AST 34  ALT 12  ALKPHOS 109  BILITOT 0.3  BILIDIR --  IBILI --   Dg Chest Portable 1 View  09/22/2012  *RADIOLOGY REPORT*  Clinical Data: Central venous catheter placement.  PORTABLE CHEST - 1 VIEW 09/22/2012 2105 hours:  Comparison: Portable chest x-ray earlier same date 1856 hours.  Findings: Right subclavian  central venous catheter extends upward into the right internal jugular vein.  The tip is not included on the image.  Endotracheal tube tip remains in satisfactory position projecting approximately 5-6 cm above the carina.  Left jugular dialysis catheter tips project over the mid and lower SVC, unchanged.  Diffuse interstitial and airspace pulmonary edema is unchanged since examination earlier in the day, as are the small bilateral pleural effusions.  No new pulmonary parenchymal abnormalities.  IMPRESSION:  1.  Right subclavian central venous catheter extends upward into the right internal jugular vein.  Its tip was not included on the image. 2.  Remaining support apparatus satisfactory. 3.  No change in the CHF and/or fluid overload and small bilateral pleural effusions since the examination earlier today.   Original Report Authenticated By: Hulan Saas, M.D.    Dg Chest Port 1 View  09/22/2012  *RADIOLOGY REPORT*  Clinical Data: Endotracheal tube placement, altered mental status  PORTABLE CHEST - 1 VIEW  Comparison: 08/19/2012; 07/28/2012  Findings:  Grossly unchanged cardiac silhouette and mediastinal contours. Interval intubation with endotracheal tube overlying tracheal air column with tip superior to the carina.  Stable positioning of left jugular approach central venous catheter with tips overlying the superior aspect the right atrium.  Pulmonary vasculature is indistinct with cephalization of flow. Worsening perihilar heterogeneous opacities.  No definite pleural effusion or pneumothorax.  Unchanged bones.  IMPRESSION: 1.  Endotracheal tube overlies tracheal air column with tip superior to the carina.  No pneumothorax. 2.  Findings most suggestive of pulmonary edema.   Original Report Authenticated By: Tacey Ruiz, MD     ROS: unobtainable as pt intubated  PHYSICAL EXAM: Blood pressure 90/48, pulse 76, temperature 97.3 F (36.3 C), temperature source Axillary, resp. rate 18, height 5' 4.17"  (1.63 m), weight 82 kg (180 lb 12.4 oz), SpO2 100.00%. HEENT: PERRL, ET tube in place NECK:Lt IJ perm cath LUNGS:bil crackles CARDIAC:RRR ABD:= BS Soft ND no overt tenderness EXT:no edema.  Rt UA AVF + bruit NEURO:sedated  Assessment: 1. Pulm edema 2. Hyperkalemia 3. Anemia 4. Sec Hpth 5. ESRD 6. COPD PLAN: 1. HD this AM 2. Transfuse 2 units on HD 3. Will get info from her unit when it opens   Deneise Getty T 09/23/2012, 4:36 AM

## 2012-09-23 NOTE — Progress Notes (Signed)
eLink Physician-Brief Progress Note Patient Name: [name] DOB: 01-Dec-1940 MRN: [id number]  Date of Service  09/23/2012   HPI/Events of Note  Access issues with pain at IO site.  Has propofol infusing at that site.   eICU Interventions  Plan: Change to intermittent sedation with F/V  D/C infusion of propofol   Intervention Category Minor Interventions: Routine modifications to care plan (e.g. PRN medications for pain, fever)  DETERDING,ELIZABETH 09/23/2012, 1:46 AM

## 2012-09-23 NOTE — Progress Notes (Signed)
CRITICAL VALUE ALERT  Critical value received:  Trop 0.33   Date of notification:  09/23/2012  Time of notification:  0415  Critical value read back:yes  Nurse who received alert:  Jasper Riling   MD notified (1st page):  Dr. Darrick Penna   Time of first page:  0440  MD notified (2nd page):  Time of second page:  Responding MD:  Dr. Darrick Penna  Time MD responded:  [phone]

## 2012-09-24 DIAGNOSIS — N186 End stage renal disease: Secondary | ICD-10-CM

## 2012-09-24 DIAGNOSIS — A0472 Enterocolitis due to Clostridium difficile, not specified as recurrent: Secondary | ICD-10-CM

## 2012-09-24 LAB — TYPE AND SCREEN
ABO/RH(D): O POS
Unit division: 0

## 2012-09-24 LAB — GLUCOSE, CAPILLARY: Glucose-Capillary: 172 mg/dL — ABNORMAL HIGH (ref 70–99)

## 2012-09-24 LAB — URINE CULTURE: Culture: NO GROWTH

## 2012-09-24 LAB — ERYTHROPOIETIN: Erythropoietin: 29.7 m[IU]/mL (ref 2.6–34.0)

## 2012-09-24 MED ORDER — FLUTICASONE PROPIONATE HFA 110 MCG/ACT IN AERO
2.0000 | INHALATION_SPRAY | Freq: Two times a day (BID) | RESPIRATORY_TRACT | Status: DC
  Filled 2012-09-24 (×2): qty 12

## 2012-09-24 MED ORDER — CARVEDILOL 25 MG PO TABS
25.0000 mg | ORAL_TABLET | Freq: Two times a day (BID) | ORAL | Status: DC
  Administered 2012-09-24: 25 mg via ORAL
  Filled 2012-09-24 (×4): qty 1

## 2012-09-24 NOTE — Progress Notes (Signed)
Patient received from 2300. Placed on telemetry. Assessed per flow sheet. Denies distress at this time. VSS. Call bell near. Mamie Levers

## 2012-09-24 NOTE — Progress Notes (Signed)
[facility] ROUNDING NOTE   Subjective:   Interval History:improved alert and oriented. Wants to go home  Objective:  Vital signs in last 24 hours:  Temp:  [96.8 F (36 C)-99 F (37.2 C)] 98.8 F (37.1 C) (12/27 0429) Pulse Rate:  [29-126] 110  (12/27 0600) Resp:  [13-28] 25  (12/27 0600) BP: (88-161)/(44-85) 153/66 mmHg (12/27 0600) SpO2:  [94 %-100 %] 94 % (12/27 0600) FiO2 (%):  [40 %] 40 % (12/26 1245) Weight:  [79.2 kg (174 lb 9.7 oz)-79.4 kg (175 lb 0.7 oz)] 79.4 kg (175 lb 0.7 oz) (12/27 0500)  Weight change: -1.1 kg (-2 lb 6.8 oz) Filed Weights   09/23/12 0500 09/23/12 1145 09/24/12 0500  Weight: 82.4 kg (181 lb 10.5 oz) 79.2 kg (174 lb 9.7 oz) 79.4 kg (175 lb 0.7 oz)    Intake/Output: I/O last 3 completed shifts: In: 572.9 [I.V.:262.9; NG/GT:180; IV Piggyback:130] Out: 3604 [Urine:190; Emesis/NG output:100; Other:3314]   Intake/Output this shift:  Total I/O In: 360 [P.O.:240; I.V.:110; IV Piggyback:10] Out: 140 [Urine:140]  CVS- RRR RS- occassional wheezes  Using oxygen 2 L  ABD- BS present soft non-distended EXT- no edema   Basic Metabolic Panel:  Lab 09/23/12 1610 09/23/12 0315 09/22/12 1845  NA 138 139 134*  K 3.7 4.4 5.9*  CL 97 100 95*  CO2 28 24 25   GLUCOSE 91 111* 201*  BUN 14 46* 41*  CREATININE 3.10* 6.89* 6.25*  CALCIUM 8.6 8.8 8.6  MG 2.0 2.4 --  PHOS -- 6.3* --    Liver Function Tests:  Lab 09/22/12 1845  AST 34  ALT 12  ALKPHOS 109  BILITOT 0.3  PROT 7.5  ALBUMIN 2.6*   No results found for this basename: LIPASE:5,AMYLASE:5 in the last 168 hours No results found for this basename: AMMONIA:3 in the last 168 hours  CBC:  Lab 09/23/12 1741 09/23/12 0315 09/22/12 1845  WBC 13.0* 14.2* 31.3*  NEUTROABS -- -- 21.0*  HGB 11.0* 7.6* 9.8*  HCT 35.9* 26.1* 34.2*  MCV 98.6 105.7* 108.2*  PLT 190 188 PLATELET CLUMPS NOTED ON SMEAR, COUNT APPEARS ADEQUATE    Cardiac Enzymes:  Lab 09/23/12 0911 09/23/12 0311  09/22/12 2122 09/22/12 1846  CKTOTAL -- -- -- --  CKMB -- -- -- --  CKMBINDEX -- -- -- --  TROPONINI 0.40* 0.33* <0.30 <0.30    BNP: No components found with this basename: POCBNP:5  CBG:  Lab 09/24/12 0427 09/23/12 2304 09/23/12 1932 09/23/12 1705 09/23/12 1237  GLUCAP 92 138* 101* 87 96    Microbiology: Results for orders placed during the hospital encounter of 09/22/12  URINE CULTURE     Status: Normal   Collection Time   09/22/12  7:10 PM      Component Value Range Status Comment   Specimen Description URINE, RANDOM   Final    Special Requests NONE   Final    Culture  Setup Time 09/22/2012 22:48   Final    Colony Count NO GROWTH   Final    Culture NO GROWTH   Final    Report Status 09/24/2012 FINAL   Final   MRSA PCR SCREENING     Status: Abnormal   Collection Time   09/22/12 10:59 PM      Component Value Range Status Comment   MRSA by PCR POSITIVE (*) NEGATIVE Final     Coagulation Studies:  Basename 09/22/12 1910  LABPROT 13.4  INR 1.03    Urinalysis:  Basename  09/22/12 1910  COLORURINE YELLOW  LABSPEC 1.012  PHURINE 7.5  GLUCOSEU 250*  HGBUR NEGATIVE  BILIRUBINUR NEGATIVE  KETONESUR NEGATIVE  PROTEINUR >300*  UROBILINOGEN 0.2  NITRITE NEGATIVE  LEUKOCYTESUR NEGATIVE      Imaging: Dg Chest Port 1 View  09/23/2012  *RADIOLOGY REPORT*  Clinical Data: Intubated.  PORTABLE CHEST - 1 VIEW  Comparison: 09/22/2012.  Findings: Endotracheal tube tip 4.2 cm above the carina.  Left sided split type catheter in place with the tips in the region of the distal superior vena cava/cavoatrial junction.  No gross pneumothorax.  There is a catheter projecting over the right supraclavicular region.  Etiology indeterminate.  This appears different than the prior right central line (which was directed in a ceaphalad direction).  Clinical correlation recommended to determine if this is related to structure other than a central line (such as related to intubation).   Cardiomegaly.  Asymmetric pulmonary edema greater on the left. Interstitial infiltrate not excluded.  IMPRESSION: There is a catheter projecting over the right supraclavicular region.  Etiology indeterminate.  This appears different than the prior right central line (which was directed in a ceaphalad direction).  Clinical correlation recommended to determine if this is related to structure other than a central line (such as related to intubation).  Cardiomegaly.  Asymmetric pulmonary edema greater on the left.  Appearance without significant change.   Original Report Authenticated By: Lacy Duverney, M.D.    Dg Chest Portable 1 View  09/22/2012  *RADIOLOGY REPORT*  Clinical Data: Central venous catheter placement.  PORTABLE CHEST - 1 VIEW 09/22/2012 2105 hours:  Comparison: Portable chest x-ray earlier same date 1856 hours.  Findings: Right subclavian central venous catheter extends upward into the right internal jugular vein.  The tip is not included on the image.  Endotracheal tube tip remains in satisfactory position projecting approximately 5-6 cm above the carina.  Left jugular dialysis catheter tips project over the mid and lower SVC, unchanged.  Diffuse interstitial and airspace pulmonary edema is unchanged since examination earlier in the day, as are the small bilateral pleural effusions.  No new pulmonary parenchymal abnormalities.  IMPRESSION:  1.  Right subclavian central venous catheter extends upward into the right internal jugular vein.  Its tip was not included on the image. 2.  Remaining support apparatus satisfactory. 3.  No change in the CHF and/or fluid overload and small bilateral pleural effusions since the examination earlier today.   Original Report Authenticated By: Hulan Saas, M.D.    Dg Chest Port 1 View  09/22/2012  *RADIOLOGY REPORT*  Clinical Data: Endotracheal tube placement, altered mental status  PORTABLE CHEST - 1 VIEW  Comparison: 08/19/2012; 07/28/2012  Findings:   Grossly unchanged cardiac silhouette and mediastinal contours. Interval intubation with endotracheal tube overlying tracheal air column with tip superior to the carina.  Stable positioning of left jugular approach central venous catheter with tips overlying the superior aspect the right atrium.  Pulmonary vasculature is indistinct with cephalization of flow. Worsening perihilar heterogeneous opacities.  No definite pleural effusion or pneumothorax.  Unchanged bones.  IMPRESSION: 1.  Endotracheal tube overlies tracheal air column with tip superior to the carina.  No pneumothorax. 2.  Findings most suggestive of pulmonary edema.   Original Report Authenticated By: Tacey Ruiz, MD      Medications:      . sodium chloride 10 mL/hr at 09/24/12 0400      . albuterol-ipratropium  2 puff Inhalation Q6H  . aspirin  324  mg Per NG tube Daily  . carvedilol  12.5 mg Oral BID WC  . heparin subcutaneous  5,000 Units Subcutaneous Q8H  . insulin aspart  0-15 Units Subcutaneous Q4H  . mupirocin ointment  1 application Nasal BID  . pantoprazole (PROTONIX) IV  40 mg Intravenous Q24H  . rOPINIRole  1 mg Per Tube BID  . vancomycin  125 mg Oral QID   Followed by  . vancomycin  125 mg Oral BID   Followed by  . vancomycin  125 mg Oral Daily   Followed by  . vancomycin  125 mg Oral QODAY   Followed by  . vancomycin  125 mg Oral Q3 days   albuterol-ipratropium  Assessment/ Plan:   ESRD-TTS [place]  ANEMIA-stable transfused 2 units  MBD-controlled  HTN/VOL-improved with dialysis  ACCESS-AVF Lt IJ perm cath  OTHER-EF 55%  Patient reportedly missed dialysis. Developed pulmonary edema and respiratory failure   LOS: 2 Meggen Spaziani W @TODAY @6 :39 AM

## 2012-09-24 NOTE — Progress Notes (Signed)
MEDICATION RELATED CONSULT NOTE - INITIAL   Pharmacy Consult for po vanc taper Indication: C diff  Allergies  Allergen Reactions  . Morphine And Related Nausea And Vomiting  . Lipitor (Atorvastatin) Other (See Comments)    Causes drowsiness    Patient Measurements: Height: 5' 4.17" (163 cm) Weight: 175 lb 0.7 oz (79.4 kg) IBW/kg (Calculated) : 55.1  Adjusted Body Weight:   Vital Signs: Temp: 98.6 F (37 C) (12/27 0725) Temp src: Oral (12/27 0725) BP: 148/79 mmHg (12/27 1018) Pulse Rate: 114  (12/27 1018) Intake/Output from previous day: 12/26 0701 - 12/27 0700 In: 550 [P.O.:240; I.V.:240; NG/GT:60; IV Piggyback:10] Out: 3534 [Urine:220] Intake/Output from this shift: Total I/O In: 30 [I.V.:30] Out: 60 [Urine:60]  Labs:  [facility] 09/23/12 1741 09/23/12 0315 09/22/12 1845  WBC 13.0* 14.2* 31.3*  HGB 11.0* 7.6* 9.8*  HCT 35.9* 26.1* 34.2*  PLT 190 188 PLATELET CLUMPS NOTED ON SMEAR, COUNT APPEARS ADEQUATE  APTT -- -- --  CREATININE 3.10* 6.89* 6.25*  LABCREA -- -- --  CREATININE 3.10* 6.89* 6.25*  CREAT24HRUR -- -- --  MG 2.0 2.4 --  PHOS -- 6.3* --  ALBUMIN -- -- 2.6*  PROT -- -- 7.5  ALBUMIN -- -- 2.6*  AST -- -- 34  ALT -- -- 12  ALKPHOS -- -- 109  BILITOT -- -- 0.3  BILIDIR -- -- --  IBILI -- -- --   Estimated Creatinine Clearance: 17.3 ml/min (by C-G formula based on Cr of 3.1).   Microbiology: Recent Results (from the past 720 hour(s))  URINE CULTURE     Status: Normal   Collection Time   09/22/12  7:10 PM      Component Value Range Status Comment   Specimen Description URINE, RANDOM   Final    Special Requests NONE   Final    Culture  Setup Time 09/22/2012 22:48   Final    Colony Count NO GROWTH   Final    Culture NO GROWTH   Final    Report Status 09/24/2012 FINAL   Final   MRSA PCR SCREENING     Status: Abnormal   Collection Time   09/22/12 10:59 PM      Component Value Range Status Comment   MRSA by PCR POSITIVE (*) NEGATIVE Final      Medical History: Past Medical History  Diagnosis Date  . Carotid artery occlusion     bilat CEA in 2012?  . C. 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 Hypertension   . Atrial fibrillation April  2013  . Pulmonary hypertension   . Secondary hyperparathyroidism, renal   . COPD (chronic obstructive pulmonary disease)   . Hypercholesterolemia   . History of MI (myocardial infarction)     "they say I had 2 years ago" (08/20/2012)  . GERD (gastroesophageal reflux disease)   . History of pneumonia     "have it alot" (08/20/2012)  . Sleep apnea     "don't wear mask anymore"  . Type II diabetes mellitus   . History of blood transfusion 2013    "3 times so far in 2013:  4 pints one time; 2 pints another; ?# last time" (08/20/2012)  . Iron deficiency anemia   . Seizures 2012    after carotid surgery  . ESRD on dialysis     "[place]; Tues, Thurs; Sat"  . Renal cell carcinoma 2005?  Marland Kitchen On home oxygen therapy     "24/7"   .  Acute diverticulitis     Oct 2013, treated medically [facility], sigmoid diverticulitits    Medications:  Prescriptions prior to admission  Medication Sig Dispense Refill  . albuterol (PROVENTIL) (2.5 MG/3ML) 0.083% nebulizer solution Take 2.5 mg by nebulization every 6 (six) hours as needed. For shortness of breath      . calcium acetate, Phos Binder, (PHOSLYRA) 667 MG/5ML SOLN Take 1,334 mg by mouth 4 (four) times daily.      . carvedilol (COREG) 25 MG tablet Take 25 mg by mouth 2 (two) times daily with a meal.      . ezetimibe (ZETIA) 10 MG tablet Take 10 mg by mouth daily.      . fluticasone (FLOVENT HFA) 110 MCG/ACT inhaler Inhale 1 puff into the lungs 2 (two) times daily.      Marland Kitchen rOPINIRole (REQUIP) 1 MG tablet Take 1 mg by mouth 2 (two) times daily.        Assessment: 71 yo lady who was admitted with acute resp failure and pulmonary edema.  She restarted vancomycin po taper as she did not complete previous course.  Goal of  Therapy:  Eradication of infection  Plan:  Vancomycin taper as per protocol.  [name] 09/24/2012,10:32 AM

## 2012-09-24 NOTE — Discharge Summary (Signed)
Physician Discharge Summary  Patient ID: CHANCI OJALA MRN: [id number] DOB/AGE: 1941-06-08 71 y.o.  Admit date: 09/22/2012 Discharge date: 09/25/2012    Discharge Diagnoses:  Hypertensive Emergency Acute Respiratory Failure Acute Pulmonary Edema COPD Acute Encephalopathy C. Difficile colitis End stage renal disease Anemia    Brief Summary: [name] is a 71 y.o. y/o female with a PMH of ESRD who skipped HD and was admitted on 12/25 with hypertensive emergency, acute pulmonary edema and acute respiratory failure requiring intubation. Treated with diuretics, nitroglycerin & hemodialysis.  BP improved post hemodialysis and she tolerated extubation 09/23/2012.     LINES / TUBES:  OETT 12/25 >>>extubated 09/23/12  OGT 12/25 >>>removed 09/23/12  Foley 12/25 >>>12/26  R  TLC 12/25 - removed as tip pointed cephalad   CULTURES:  12/25 Blood >>>  12/25 Urine >>>  12/25 Respiratory >>>   ANTIBIOTICS:  Vancomycin PO 12/25 >>>  Flagyl 12/25 >>>   SIGNIFICANT EVENTS:  12/25 Admitted with hypertensive emergency, acute pulmonary edema and acute respiratory failure requiring intubation.                                                                      Hospital Summary by Discharge Diagnosis  Hypertensive Emergency Presented on 12/25 after skipping regularly scheduled HD with volume overload, hypertensive emergency, pulmonary edema and altered mental status.  Required intubation for respiratory failure, aggressive blood pressure control and hemodialysis for resolution of hypertensive emergency.  Noted mild elevation in troponin without EKG changes.   Discharge Plan: -resume home Coreg dose -continue zetia   Acute Respiratory Failure Acute Pulmonary Edema COPD Acute respiratory failure in setting of hypertensive emergency with resultant pulmonary edema.  Underlying COPD without acute exacerbation.  Required intubation for respiratory support.  Post HD and BP control,  patient was successfully liberated from mechanical ventilation.    Discharge Plan: -Resume Flovent & PRN albuterol at discharge  Acute Encephalopathy Acute encephalopathy in setting of hypertensive emergency.  Acute mental status resolved with correction of hypertension.  No further follow up at this time.    C. Difficile colitis Previous c-difficile colitis and medication non-compliance with reocurrance of infection.    Discharge Plan: -complete extended treatment taper of PO vancomycin as below   End stage renal disease Hyperkalemia Anemia ESRD on HD but does make some urine.  Skipped HD and had resultant hypertensive emergency, pulmonary edema and respiratory failure.  Responded well to HD / Diuretics.    Discharge Plan: -Adherence to Hemodialysis -Follow up with HD on 12/28 in [place] -continue phoslyra     Discharge Exam: General: Comfortable, NAD  Neuro: Alert and appropriate, non focal  HEENT: PERRL, mm pink/moist Cardiovascular: Tachycardic, regular  Lungs: Bilateral diminished air entry, faint crackles in bases  Abdomen: Soft, nontender, normal bowel sounds  Musculoskeletal: Moves all extremities  Skin: Intact   Filed Vitals:   09/25/12 1137 09/25/12 1357 09/25/12 1504 09/25/12 1511  BP: 124/54  116/100 124/58  Pulse: 85  96   Temp: 97.8 F (36.6 C)  97.7 F (36.5 C)   TempSrc: Oral  Oral   Resp: 26  16   Height:      Weight: 74.8 kg (164 lb 14.5 oz)     SpO2:  99% 98% 96%    2 liters   Discharge Labs  BMET  Lab 09/25/12 0740 09/23/12 1741 09/23/12 0315 09/22/12 1845  NA 136 138 139 134*  K 3.7 3.7 -- --  CL 95* 97 100 95*  CO2 27 28 24 25   GLUCOSE 152* 91 111* 201*  BUN 41* 14 46* 41*  CREATININE 6.11* 3.10* 6.89* 6.25*  CALCIUM 8.6 8.6 8.8 8.6  MG -- 2.0 2.4 --  PHOS 6.7* -- 6.3* --     CBC  Lab 09/25/12 0740 09/23/12 1741 09/23/12 0315  HGB 10.1* 11.0* 7.6*  HCT 33.2* 35.9* 26.1*  WBC 14.8* 13.0* 14.2*  PLT 180 190 188    Anti-Coagulation  Lab 09/22/12 1910  INR 1.03    Discharge Orders    Future Appointments: Provider: Department: Dept Phone: Center:   08/30/2013 2:30 PM [facility] [phone] [facility]   08/30/2013 3:40 PM Evern Bio, NP [facility] [phone] [facility]     Future Orders Please Complete By Expires   Diet - low sodium heart healthy      Increase activity slowly          Follow-up Information    Schedule an appointment as soon as possible for a visit with [name], MD.   Contact information:   [address] [place] [address] [phone]       Follow up with Hemodialysis. On 09/27/2012. (Follow up with HD as previously scheduled. )    Contact information:   [facility]             Medication List     As of 09/25/2012  3:32 PM    START taking these medications         vancomycin 50 mg/mL oral solution   Commonly known as: VANCOCIN   Take 125mg  qid x7d then 125mg  bid x 7d, then 125mg  qd x7d then 125mg  qod x7 days, then every 3d x 14 days      CONTINUE taking these medications         albuterol (2.5 MG/3ML) 0.083% nebulizer solution   Commonly known as: PROVENTIL      calcium acetate (Phos Binder) 667 MG/5ML Soln   Commonly known as: PHOSLYRA      carvedilol 25 MG tablet   Commonly known as: COREG      ezetimibe 10 MG tablet   Commonly known as: ZETIA      fluticasone 110 MCG/ACT inhaler   Commonly known as: FLOVENT HFA      rOPINIRole 1 MG tablet   Commonly known as: REQUIP          Where to get your medications    These are the prescriptions that you need to pick up.   You may get these medications from any pharmacy.         vancomycin 50 mg/mL oral solution              Disposition: 06-Home-Health Care Svc  Discharged Condition: Layanna A Silvers has met maximum benefit of inpatient care and is medically stable and cleared for discharge.  Patient is  pending follow up as above.      Time spent on disposition:  Greater than 35 minutes.   Signed:

## 2012-09-24 NOTE — Progress Notes (Signed)
PULMONARY  / CRITICAL CARE MEDICINE  Name: [name] MRN: [id number] DOB: [birth year]    LOS: 2  REFERRING MD:  EMD  CHIEF COMPLAINT:  Acute respiratory failure  BRIEF PATIENT DESCRIPTION: [name] admitted 12/25 with hypertensive emergency, acute pulmonary edema and acute respiratory failure requiring intubation. Extubated 09/23/2012   LINES / TUBES: OETT 12/25 >>>extubated 09/23/12 OGT 12/25 >>>removed 09/23/12 Foley 12/25 >>>12/26 R Saxon TLC 12/25 - removed as tip pointed cephalad  CULTURES: 12/25  Blood >>> 12/25  Urine >>> 12/25  Respiratory >>>  ANTIBIOTICS: Vancomycin PO 12/25 >>> Flagyl 12/25 >>>  SIGNIFICANT EVENTS:  12/25  Admitted with hypertensive emergency, acute pulmonary edema and acute respiratory failure requiring intubation.  LEVEL OF CARE:  ICU  PRIMARY SERVICE:  PCCM  CONSULTANTS:  Renal (called by EDP)  CODE STATUS:  Full  DIET:  NPO  DVT Px:  Heparin  GI Px:  Protonix  The patient is encephalopathic and unable to provide history, which was obtained for available medical records.  INTAKE / OUTPUT: Intake/Output      12/26 0701 - 12/27 0700 12/27 0701 - 12/28 0700   P.O. 240    I.V. (mL/kg) 240 (3) 40 (0.5)   NG/GT 60    IV Piggyback 10    Total Intake(mL/kg) 550 (6.9) 40 (0.5)   Urine (mL/kg/hr) 220 (0.1) 60 (0.1)   Emesis/NG output     Other 3314    Total Output 3534 60   Net -2984 -20        Stool Occurrence  1 x    PHYSICAL EXAMINATION: General:  Comfortable, NAD Neuro:  Alert and appropriate, non focal  HEENT:  PERRL, OETT / OGT Cardiovascular:  Tachycardic, regular Lungs:  Bilateral diminished air entry, no crackles or rhonchi Abdomen:  Soft, nontender, normal bowel sounds  Musculoskeletal:  Moves all extremities Skin:  Intact  LABS:  Lab 09/24/12 0605 09/23/12 1741 09/23/12 0911 09/23/12 0316 09/23/12 0315 09/23/12 0311 09/23/12 0055 09/22/12 2122 09/22/12 1935 09/22/12 1910 09/22/12 1846 09/22/12 1845    HGB -- 11.0* -- -- 7.6* -- -- -- -- -- -- 9.8*  WBC -- 13.0* -- -- 14.2* -- -- -- -- -- -- 31.3*  PLT -- 190 -- -- 188 -- -- -- -- -- -- PLATELET CLUMPS NOTED ON SMEAR, COUNT APPEARS ADEQUATE  NA -- 138 -- -- 139 -- -- -- -- -- -- 134*  K -- 3.7 -- -- 4.4 -- -- -- -- -- -- --  CL -- 97 -- -- 100 -- -- -- -- -- -- 95*  CO2 -- 28 -- -- 24 -- -- -- -- -- -- 25  GLUCOSE -- 91 -- -- 111* -- -- -- -- -- -- 201*  BUN -- 14 -- -- 46* -- -- -- -- -- -- 41*  CREATININE -- 3.10* -- -- 6.89* -- -- -- -- -- -- 6.25*  CALCIUM -- 8.6 -- -- 8.8 -- -- -- -- -- -- 8.6  MG -- 2.0 -- -- 2.4 -- -- -- -- -- -- --  PHOS -- -- -- -- 6.3* -- -- -- -- -- -- --  AST -- -- -- -- -- -- -- -- -- -- -- 34  ALT -- -- -- -- -- -- -- -- -- -- -- 12  ALKPHOS -- -- -- -- -- -- -- -- -- -- -- 109  BILITOT -- -- -- -- -- -- -- -- -- -- --  0.3  PROT -- -- -- -- -- -- -- -- -- -- -- 7.5  ALBUMIN -- -- -- -- -- -- -- -- -- -- -- 2.6*  APTT -- -- -- -- -- -- -- -- -- -- -- --  INR -- -- -- -- -- -- -- -- -- 1.03 -- --  LATICACIDVEN -- -- -- -- -- -- -- -- -- -- 1.0 --  TROPONINI -- -- 0.40* -- -- 0.33* -- <0.30 -- -- -- --  PROCALCITON 5.16 -- -- 2.63 -- -- -- -- -- -- -- --  PROBNP -- -- -- -- -- -- -- -- -- -- -- --  O2SATVEN -- -- -- -- -- -- -- -- -- -- -- --  PHART -- -- -- -- -- -- 7.356 -- 7.149* -- -- --  PCO2ART -- -- -- -- -- -- 47.3* -- 89.5* -- -- --  PO2ART -- -- -- -- -- -- 141.0* -- 455.0* -- -- --    Lab 09/24/12 1631 09/24/12 1152 09/24/12 0722 09/24/12 0427 09/23/12 2304  GLUCAP 114* 172* 103* 92 138*    IMAGING: 12/25  PCXR >>> Pulmonary edema, ETT good position,   ECG: 12/25  12-lead ECG >>> Sinus tachycardia, no ST-T changes  DIAGNOSES: Active Problems:  End stage renal disease  DM2 (diabetes mellitus, type 2)  Anemia  COPD (chronic obstructive pulmonary disease)  Leukocytosis  Acute respiratory failure  Acute encephalopathy  Acute pulmonary edema  C. difficile colitis  ASSESSMENT /  PLAN:  PULMONARY  A:  Acute respiratory failure in setting of hypertensive emergency with acute pulmonary edema.  COPD without exacerbation. P:   <24h post extubation  Combivent Flovent   CARDIOVASCULAR  A:  Hypertensive emergency. P:  Resolved Restart Coreg, Zetia when stable Trend Troponin  RENAL  A:  ESRD on HD.  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  Making urine.  Hyperkalemia, likely secondary to acidosis resolved. P:   HD performed 09/23/12 Trend BMP Renal consult appreciate input and help To receive HD tomorrow prior to discharge (discussed with Dr. Hyman Hopes)   GASTROINTESTINAL  A:  No active issues. P:   Advance diet as tolerated    HEMATOLOGIC  A:  Anemia, likely of renal disease. P:  Repeat CBC in pm; Anemia studies suggestive of anemia of chronic disease with a possible acute componenet of iron deficiency; no overt evidence of bleeding Transfused with HD  INFECTIOUS  A:  Recurrent C. Difficile infection, likely did not finish the course - ID note dated 11/27 recommends 63 more days of Vancomycin PO taper, but per family patient stopped).  No overt signs of other infectious source otherwise. P:   Restart C. Diff regimen (Flagyl / Vanco) Defer antibiotics otherwise Panculture PCT May need ID consult Trend WBC  ENDOCRINE   A:  DM.  Hyperglycemia. P:   SSI  NEUROLOGIC  A:  Acute encephalopathy. P:   Resolved   CLINICAL SUMMARY: [name] admitted 12/25 with hypertensive emergency, acute pulmonary edema and acute respiratory failure requiring intubation.  Now encephalopathy resolved, blood pressure within normal range.  Leukocytosis likely secondary to undertreated C. Dif - will restart Vanco and add Flagyl.     I have personally obtained a history, examined the patient, evaluated laboratory and imaging results, formulated the assessment and plan and placed orders.  CRITICAL CARE:  The patient is critically ill with multiple  organ systems failure and requires high complexity decision making for assessment  and support, frequent evaluation and titration of therapies, application of advanced monitoring technologies and extensive interpretation of multiple databases. Critical Care Time devoted to patient care services described in this note is 60 minutes.   Vanetta Mulders, MD  Pulmonary and Critical Care Medicine [facility] Pager: [phone]  09/24/2012, 5:10 PM

## 2012-09-24 NOTE — Progress Notes (Signed)
Report called to RN and met at bedside.  Patient transferred to 2019 and attached to tele monitor without complications.

## 2012-09-25 LAB — CBC
HCT: 33.2 % — ABNORMAL LOW (ref 36.0–46.0)
MCHC: 30.4 g/dL (ref 30.0–36.0)
RDW: 20 % — ABNORMAL HIGH (ref 11.5–15.5)

## 2012-09-25 LAB — GLUCOSE, CAPILLARY
Glucose-Capillary: 85 mg/dL (ref 70–99)
Glucose-Capillary: 109 mg/dL — ABNORMAL HIGH (ref 70–99)

## 2012-09-25 LAB — RENAL FUNCTION PANEL
Albumin: 2.3 g/dL — ABNORMAL LOW (ref 3.5–5.2)
BUN: 41 mg/dL — ABNORMAL HIGH (ref 6–23)
Calcium: 8.6 mg/dL (ref 8.4–10.5)
Creatinine, Ser: 6.11 mg/dL — ABNORMAL HIGH (ref 0.50–1.10)
Phosphorus: 6.7 mg/dL — ABNORMAL HIGH (ref 2.3–4.6)
Potassium: 3.7 mEq/L (ref 3.5–5.1)

## 2012-09-25 MED ORDER — PENTAFLUOROPROP-TETRAFLUOROETH EX AERO: 1.0000 "application " | INHALATION_SPRAY | CUTANEOUS | Status: DC | PRN

## 2012-09-25 MED ORDER — NEPRO/CARBSTEADY PO LIQD: 237.0000 mL | ORAL | Status: DC | PRN

## 2012-09-25 MED ORDER — VANCOMYCIN 50 MG/ML ORAL SOLUTION: ORAL | Status: DC

## 2012-09-25 MED ORDER — LIDOCAINE HCL (PF) 1 % IJ SOLN: 5.0000 mL | INTRAMUSCULAR | Status: DC | PRN

## 2012-09-25 MED ORDER — HEPARIN SODIUM (PORCINE) 1000 UNIT/ML DIALYSIS: 1000.0000 [IU] | INTRAMUSCULAR | Status: DC | PRN

## 2012-09-25 MED ORDER — LIDOCAINE-PRILOCAINE 2.5-2.5 % EX CREA: 1.0000 "application " | TOPICAL_CREAM | CUTANEOUS | Status: DC | PRN

## 2012-09-25 MED ORDER — SODIUM CHLORIDE 0.9 % IV SOLN: 100.0000 mL | INTRAVENOUS | Status: DC | PRN

## 2012-09-25 MED ORDER — ALTEPLASE 2 MG IJ SOLR
2.0000 mg | Freq: Once | INTRAMUSCULAR | Status: DC | PRN
  Filled 2012-09-25: qty 2

## 2012-09-25 NOTE — Progress Notes (Signed)
Patient discharge instructions reviewed, all questions answered. Patient discharged home.

## 2012-09-25 NOTE — Procedures (Signed)
I have seen and examined this patient and agree with the plan of care . Lower EDW to 75.5 Kg  [name] W 09/25/2012, 8:34 AM

## 2012-09-25 NOTE — Progress Notes (Signed)
Standing weight 74,8 KG, pulled 3000 ml net, if had weighted in bed would have weighted 77.5. Dhatley rn

## 2012-09-25 NOTE — Progress Notes (Signed)
[facility] ROUNDING NOTE   Subjective:   Interval History: tolerating dialysis . Going home after treatment  Objective:  Vital signs in last 24 hours:  Temp:  [97.7 F (36.5 C)-98.6 F (37 C)] 98.6 F (37 C) (12/28 0707) Pulse Rate:  [81-114] 83  (12/28 0707) Resp:  [20-30] 27  (12/28 0707) BP: (97-155)/(40-87) 123/56 mmHg (12/28 0707) SpO2:  [93 %-97 %] 97 % (12/28 0707) Weight:  [78.6 kg (173 lb 4.5 oz)-80.5 kg (177 lb 7.5 oz)] 80.5 kg (177 lb 7.5 oz) (12/28 0707)  Weight change: -0.6 kg (-1 lb 5.2 oz) Filed Weights   09/24/12 0500 09/25/12 0421 09/25/12 0707  Weight: 79.4 kg (175 lb 0.7 oz) 78.6 kg (173 lb 4.5 oz) 80.5 kg (177 lb 7.5 oz)    Intake/Output: I/O last 3 completed shifts: In: 650 [P.O.:480; I.V.:160; IV Piggyback:10] Out: 200 [Urine:200]   Intake/Output this shift:     CVS- RRR  RS- occassional wheezes Using oxygen 2 L  ABD- BS present soft non-distended  EXT- no edema    Basic Metabolic Panel:  Lab 09/23/12 1610 09/23/12 0315 09/22/12 1845  NA 138 139 134*  K 3.7 4.4 5.9*  CL 97 100 95*  CO2 28 24 25   GLUCOSE 91 111* 201*  BUN 14 46* 41*  CREATININE 3.10* 6.89* 6.25*  CALCIUM 8.6 8.8 8.6  MG 2.0 2.4 --  PHOS -- 6.3* --    Liver Function Tests:  Lab 09/22/12 1845  AST 34  ALT 12  ALKPHOS 109  BILITOT 0.3  PROT 7.5  ALBUMIN 2.6*   No results found for this basename: LIPASE:5,AMYLASE:5 in the last 168 hours No results found for this basename: AMMONIA:3 in the last 168 hours  CBC:  Lab 09/25/12 0740 09/23/12 1741 09/23/12 0315 09/22/12 1845  WBC 14.8* 13.0* 14.2* 31.3*  NEUTROABS -- -- -- 21.0*  HGB 10.1* 11.0* 7.6* 9.8*  HCT 33.2* 35.9* 26.1* 34.2*  MCV 101.2* 98.6 105.7* 108.2*  PLT 180 190 188 PLATELET CLUMPS NOTED ON SMEAR, COUNT APPEARS ADEQUATE    Cardiac Enzymes:  Lab 09/23/12 0911 09/23/12 0311 09/22/12 2122 09/22/12 1846  CKTOTAL -- -- -- --  CKMB -- -- -- --  CKMBINDEX -- -- -- --  TROPONINI  0.40* 0.33* <0.30 <0.30    BNP: No components found with this basename: POCBNP:5  CBG:  Lab 09/25/12 0423 09/25/12 0032 09/24/12 2006 09/24/12 1631 09/24/12 1152  GLUCAP 85 129* 116* 114* 172*    Microbiology: Results for orders placed during the hospital encounter of 09/22/12  URINE CULTURE     Status: Normal   Collection Time   09/22/12  7:10 PM      Component Value Range Status Comment   Specimen Description URINE, RANDOM   Final    Special Requests NONE   Final    Culture  Setup Time 09/22/2012 22:48   Final    Colony Count NO GROWTH   Final    Culture NO GROWTH   Final    Report Status 09/24/2012 FINAL   Final   MRSA PCR SCREENING     Status: Abnormal   Collection Time   09/22/12 10:59 PM      Component Value Range Status Comment   MRSA by PCR POSITIVE (*) NEGATIVE Final     Coagulation Studies:  Basename 09/22/12 1910  LABPROT 13.4  INR 1.03    Urinalysis:  Basename 09/22/12 1910  COLORURINE YELLOW  LABSPEC 1.012  PHURINE 7.5  GLUCOSEU 250*  HGBUR NEGATIVE  BILIRUBINUR NEGATIVE  KETONESUR NEGATIVE  PROTEINUR >300*  UROBILINOGEN 0.2  NITRITE NEGATIVE  LEUKOCYTESUR NEGATIVE      Imaging: No results found.   Medications:      . sodium chloride 10 mL/hr at 09/24/12 0400      . albuterol-ipratropium  2 puff Inhalation Q6H  . aspirin  324 mg Per NG tube Daily  . carvedilol  25 mg Oral BID WC  . fluticasone  2 puff Inhalation BID  . heparin subcutaneous  5,000 Units Subcutaneous Q8H  . insulin aspart  0-15 Units Subcutaneous Q4H  . mupirocin ointment  1 application Nasal BID  . pantoprazole (PROTONIX) IV  40 mg Intravenous Q24H  . rOPINIRole  1 mg Per Tube BID  . vancomycin  125 mg Oral QID   Followed by  . vancomycin  125 mg Oral BID   Followed by  . vancomycin  125 mg Oral Daily   Followed by  . vancomycin  125 mg Oral QODAY   Followed by  . vancomycin  125 mg Oral Q3 days   sodium chloride, sodium chloride, albuterol-ipratropium,  alteplase, feeding supplement (NEPRO CARB STEADY), heparin, lidocaine, lidocaine-prilocaine, pentafluoroprop-tetrafluoroeth  Assessment/ Plan:  ESRD-TTS [place]  ANEMIA-stable transfused 2 units  MBD-controlled  HTN/VOL-improved with dialysis  ACCESS-AVF Lt IJ perm cath  OTHER-EF 55% Patient reportedly missed dialysis. Developed pulmonary edema and respiratory failure  Seen on dialysis      lower EDW  Today to 75.5KG  LOS: 3 [name] W @TODAY @8 :31 AM

## 2012-09-25 NOTE — Progress Notes (Signed)
eLink Physician-Brief Progress Note Patient Name: [name] DOB: 05/19/1941 MRN: [id number]  Date of Service  09/25/2012   HPI/Events of Note  Call from nurse reporting that IV access failed.  Patient is ESRD and difficult access.   eICU Interventions  Plan: IV to stay out until patient evaluated by rounding MD.   Intervention Category Minor Interventions: Routine modifications to care plan (e.g. PRN medications for pain, fever)  Tia Hieronymus 09/25/2012, 6:31 AM

## 2012-10-19 ENCOUNTER — Inpatient Hospital Stay (HOSPITAL_COMMUNITY): Payer: Medicare Other

## 2012-10-19 ENCOUNTER — Inpatient Hospital Stay (HOSPITAL_COMMUNITY)
Admission: AD | Admit: 2012-10-19 | Discharge: 2012-10-23 | DRG: 208 | Disposition: A | Discharge status: Home Health | Payer: Medicare Other | Source: Other Acute Inpatient Hospital | Attending: Internal Medicine | Admitting: Internal Medicine

## 2012-10-19 DIAGNOSIS — E119 Type 2 diabetes mellitus without complications: Secondary | ICD-10-CM

## 2012-10-19 DIAGNOSIS — Z9981 Dependence on supplemental oxygen: Secondary | ICD-10-CM

## 2012-10-19 DIAGNOSIS — J811 Chronic pulmonary edema: Secondary | ICD-10-CM

## 2012-10-19 DIAGNOSIS — I509 Heart failure, unspecified: Secondary | ICD-10-CM | POA: Diagnosis present

## 2012-10-19 DIAGNOSIS — K5732 Diverticulitis of large intestine without perforation or abscess without bleeding: Secondary | ICD-10-CM

## 2012-10-19 DIAGNOSIS — Z23 Encounter for immunization: Secondary | ICD-10-CM

## 2012-10-19 DIAGNOSIS — I4891 Unspecified atrial fibrillation: Secondary | ICD-10-CM | POA: Diagnosis present

## 2012-10-19 DIAGNOSIS — B9689 Other specified bacterial agents as the cause of diseases classified elsewhere: Secondary | ICD-10-CM | POA: Diagnosis present

## 2012-10-19 DIAGNOSIS — D649 Anemia, unspecified: Secondary | ICD-10-CM

## 2012-10-19 DIAGNOSIS — D509 Iron deficiency anemia, unspecified: Secondary | ICD-10-CM | POA: Diagnosis present

## 2012-10-19 DIAGNOSIS — J449 Chronic obstructive pulmonary disease, unspecified: Secondary | ICD-10-CM | POA: Diagnosis present

## 2012-10-19 DIAGNOSIS — I252 Old myocardial infarction: Secondary | ICD-10-CM

## 2012-10-19 DIAGNOSIS — Z79899 Other long term (current) drug therapy: Secondary | ICD-10-CM

## 2012-10-19 DIAGNOSIS — I2789 Other specified pulmonary heart diseases: Secondary | ICD-10-CM | POA: Diagnosis present

## 2012-10-19 DIAGNOSIS — J189 Pneumonia, unspecified organism: Secondary | ICD-10-CM

## 2012-10-19 DIAGNOSIS — R9431 Abnormal electrocardiogram [ECG] [EKG]: Secondary | ICD-10-CM

## 2012-10-19 DIAGNOSIS — D62 Acute posthemorrhagic anemia: Secondary | ICD-10-CM | POA: Diagnosis present

## 2012-10-19 DIAGNOSIS — N186 End stage renal disease: Secondary | ICD-10-CM

## 2012-10-19 DIAGNOSIS — Z9861 Coronary angioplasty status: Secondary | ICD-10-CM

## 2012-10-19 DIAGNOSIS — E875 Hyperkalemia: Secondary | ICD-10-CM | POA: Diagnosis not present

## 2012-10-19 DIAGNOSIS — I120 Hypertensive chronic kidney disease with stage 5 chronic kidney disease or end stage renal disease: Secondary | ICD-10-CM | POA: Diagnosis present

## 2012-10-19 DIAGNOSIS — Z992 Dependence on renal dialysis: Secondary | ICD-10-CM | POA: Diagnosis present

## 2012-10-19 DIAGNOSIS — Z87891 Personal history of nicotine dependence: Secondary | ICD-10-CM

## 2012-10-19 DIAGNOSIS — K219 Gastro-esophageal reflux disease without esophagitis: Secondary | ICD-10-CM | POA: Diagnosis present

## 2012-10-19 DIAGNOSIS — Z91158 Patient's noncompliance with renal dialysis for other reason: Secondary | ICD-10-CM

## 2012-10-19 DIAGNOSIS — A481 Legionnaires' disease: Secondary | ICD-10-CM | POA: Diagnosis present

## 2012-10-19 DIAGNOSIS — Z9115 Patient's noncompliance with renal dialysis: Secondary | ICD-10-CM

## 2012-10-19 DIAGNOSIS — E7800 Pure hypercholesterolemia, unspecified: Secondary | ICD-10-CM | POA: Diagnosis present

## 2012-10-19 DIAGNOSIS — G9340 Encephalopathy, unspecified: Secondary | ICD-10-CM

## 2012-10-19 DIAGNOSIS — E872 Acidosis, unspecified: Secondary | ICD-10-CM | POA: Diagnosis present

## 2012-10-19 DIAGNOSIS — T82898A Other specified complication of vascular prosthetic devices, implants and grafts, initial encounter: Secondary | ICD-10-CM

## 2012-10-19 DIAGNOSIS — J4489 Other specified chronic obstructive pulmonary disease: Secondary | ICD-10-CM | POA: Diagnosis present

## 2012-10-19 DIAGNOSIS — Z85528 Personal history of other malignant neoplasm of kidney: Secondary | ICD-10-CM

## 2012-10-19 DIAGNOSIS — D72829 Elevated white blood cell count, unspecified: Secondary | ICD-10-CM

## 2012-10-19 DIAGNOSIS — A0472 Enterocolitis due to Clostridium difficile, not specified as recurrent: Secondary | ICD-10-CM

## 2012-10-19 DIAGNOSIS — N2581 Secondary hyperparathyroidism of renal origin: Secondary | ICD-10-CM | POA: Diagnosis present

## 2012-10-19 DIAGNOSIS — J9600 Acute respiratory failure, unspecified whether with hypoxia or hypercapnia: Secondary | ICD-10-CM

## 2012-10-19 DIAGNOSIS — I2510 Atherosclerotic heart disease of native coronary artery without angina pectoris: Secondary | ICD-10-CM | POA: Diagnosis present

## 2012-10-19 DIAGNOSIS — I6529 Occlusion and stenosis of unspecified carotid artery: Secondary | ICD-10-CM

## 2012-10-19 DIAGNOSIS — G4730 Sleep apnea, unspecified: Secondary | ICD-10-CM | POA: Diagnosis present

## 2012-10-19 DIAGNOSIS — J810 Acute pulmonary edema: Secondary | ICD-10-CM

## 2012-10-19 DIAGNOSIS — J9620 Acute and chronic respiratory failure, unspecified whether with hypoxia or hypercapnia: Principal | ICD-10-CM

## 2012-10-19 LAB — HEPATIC FUNCTION PANEL
ALT: 10 U/L (ref 0–35)
Alkaline Phosphatase: 86 U/L (ref 39–117)
Bilirubin, Direct: 0.2 mg/dL (ref 0.0–0.3)
Indirect Bilirubin: 0 mg/dL — ABNORMAL LOW (ref 0.3–0.9)
Total Bilirubin: 0.2 mg/dL — ABNORMAL LOW (ref 0.3–1.2)

## 2012-10-19 LAB — RENAL FUNCTION PANEL
BUN: 83 mg/dL — ABNORMAL HIGH (ref 6–23)
Calcium: 9.1 mg/dL (ref 8.4–10.5)
Glucose, Bld: 186 mg/dL — ABNORMAL HIGH (ref 70–99)
Phosphorus: 6.4 mg/dL — ABNORMAL HIGH (ref 2.3–4.6)
Potassium: 6.4 mEq/L (ref 3.5–5.1)

## 2012-10-19 LAB — CBC WITH DIFFERENTIAL/PLATELET
Basophils Absolute: 0 10*3/uL (ref 0.0–0.1)
Eosinophils Relative: 0 % (ref 0–5)
HCT: 39.8 % (ref 36.0–46.0)
Lymphocytes Relative: 6 % — ABNORMAL LOW (ref 12–46)
Lymphs Abs: 0.6 10*3/uL — ABNORMAL LOW (ref 0.7–4.0)
MCV: 100.8 fL — ABNORMAL HIGH (ref 78.0–100.0)
Monocytes Absolute: 0.1 10*3/uL (ref 0.1–1.0)
RDW: 16.8 % — ABNORMAL HIGH (ref 11.5–15.5)
WBC: 10.9 10*3/uL — ABNORMAL HIGH (ref 4.0–10.5)

## 2012-10-19 LAB — POCT I-STAT 3, ART BLOOD GAS (G3+)
Bicarbonate: 25.8 mEq/L — ABNORMAL HIGH (ref 20.0–24.0)
pH, Arterial: 7.404 (ref 7.350–7.450)
pO2, Arterial: 123 mmHg — ABNORMAL HIGH (ref 80.0–100.0)

## 2012-10-19 LAB — URINALYSIS, ROUTINE W REFLEX MICROSCOPIC
Leukocytes, UA: NEGATIVE
Nitrite: NEGATIVE
Protein, ur: >300 mg/dL — AB
Urobilinogen, UA: 0.2 mg/dL (ref 0.0–1.0)

## 2012-10-19 LAB — URINE MICROSCOPIC-ADD ON

## 2012-10-19 LAB — STREP PNEUMONIAE URINARY ANTIGEN: Strep Pneumo Urinary Antigen: NEGATIVE

## 2012-10-19 LAB — PRO B NATRIURETIC PEPTIDE: Pro B Natriuretic peptide (BNP): 17356 pg/mL — ABNORMAL HIGH (ref 0–125)

## 2012-10-19 LAB — CREATININE, SERUM: Creatinine, Ser: 9.13 mg/dL — ABNORMAL HIGH (ref 0.50–1.10)

## 2012-10-19 LAB — GLUCOSE, CAPILLARY: Glucose-Capillary: 147 mg/dL — ABNORMAL HIGH (ref 70–99)

## 2012-10-19 LAB — CK TOTAL AND CKMB (NOT AT ARMC): Relative Index: INVALID (ref 0.0–2.5)

## 2012-10-19 LAB — INFLUENZA PANEL BY PCR (TYPE A & B)
H1N1 flu by pcr: NOT DETECTED
Influenza B By PCR: NEGATIVE

## 2012-10-19 MED ORDER — SODIUM POLYSTYRENE SULFONATE 15 GM/60ML PO SUSP
30.0000 g | Freq: Once | ORAL | Status: AC
  Administered 2012-10-19: 30 g
  Filled 2012-10-19: qty 120

## 2012-10-19 MED ORDER — INSULIN ASPART 100 UNIT/ML IV SOLN
10.0000 [IU] | Freq: Once | INTRAVENOUS | Status: AC
  Administered 2012-10-19: 10 [IU] via INTRAVENOUS

## 2012-10-19 MED ORDER — BIOTENE DRY MOUTH MT LIQD
15.0000 mL | Freq: Four times a day (QID) | OROMUCOSAL | Status: DC
  Administered 2012-10-20 (×4): 15 mL via OROMUCOSAL

## 2012-10-19 MED ORDER — HEPARIN SODIUM (PORCINE) 5000 UNIT/ML IJ SOLN
5000.0000 [IU] | Freq: Three times a day (TID) | INTRAMUSCULAR | Status: DC
  Administered 2012-10-19 – 2012-10-23 (×12): 5000 [IU] via SUBCUTANEOUS
  Filled 2012-10-19 (×15): qty 1

## 2012-10-19 MED ORDER — DEXTROSE 5 % IV SOLN
INTRAVENOUS | Status: DC
  Filled 2012-10-19: qty 1000

## 2012-10-19 MED ORDER — PANTOPRAZOLE SODIUM 40 MG IV SOLR
40.0000 mg | INTRAVENOUS | Status: DC
  Administered 2012-10-19: 40 mg via INTRAVENOUS
  Filled 2012-10-19 (×3): qty 40

## 2012-10-19 MED ORDER — SODIUM CHLORIDE 0.9 % IV SOLN
25.0000 ug/h | INTRAVENOUS | Status: DC
  Administered 2012-10-19: 75 ug/h via INTRAVENOUS
  Filled 2012-10-19: qty 50

## 2012-10-19 MED ORDER — INSULIN ASPART 100 UNIT/ML ~~LOC~~ SOLN
1.0000 [IU] | SUBCUTANEOUS | Status: DC
  Administered 2012-10-19 – 2012-10-20 (×3): 1 [IU] via SUBCUTANEOUS

## 2012-10-19 MED ORDER — SODIUM BICARBONATE 8.4 % IV SOLN
INTRAVENOUS | Status: DC
  Administered 2012-10-19: 18:00:00 via INTRAVENOUS
  Filled 2012-10-19 (×2): qty 150

## 2012-10-19 MED ORDER — SODIUM CHLORIDE 0.9 % IV SOLN
250.0000 mL | INTRAVENOUS | Status: DC | PRN
  Administered 2012-10-19: 250 mL via INTRAVENOUS

## 2012-10-19 MED ORDER — DEXTROSE 50 % IV SOLN
1.0000 | Freq: Once | INTRAVENOUS | Status: AC
  Administered 2012-10-19: 50 mL via INTRAVENOUS
  Filled 2012-10-19: qty 50

## 2012-10-19 MED ORDER — CHLORHEXIDINE GLUCONATE 0.12 % MT SOLN
15.0000 mL | Freq: Two times a day (BID) | OROMUCOSAL | Status: DC
  Administered 2012-10-19 – 2012-10-20 (×2): 15 mL via OROMUCOSAL
  Filled 2012-10-19 (×2): qty 15

## 2012-10-19 MED ORDER — FUROSEMIDE 10 MG/ML IJ SOLN
120.0000 mg | Freq: Once | INTRAVENOUS | Status: DC
  Filled 2012-10-19: qty 12

## 2012-10-19 MED ORDER — SODIUM CHLORIDE 0.9 % IV SOLN
1.0000 g | Freq: Once | INTRAVENOUS | Status: AC
  Administered 2012-10-19: 1 g via INTRAVENOUS
  Filled 2012-10-19: qty 10

## 2012-10-19 MED ORDER — ASPIRIN 300 MG RE SUPP
300.0000 mg | RECTAL | Status: DC
  Filled 2012-10-19: qty 1

## 2012-10-19 MED ORDER — SODIUM BICARBONATE 8.4 % IV SOLN
INTRAVENOUS | Status: DC
  Filled 2012-10-19 (×2): qty 150

## 2012-10-19 MED ORDER — ASPIRIN 300 MG RE SUPP
300.0000 mg | Freq: Every day | RECTAL | Status: DC
  Administered 2012-10-19: 300 mg via RECTAL
  Filled 2012-10-19 (×2): qty 1

## 2012-10-19 MED ORDER — HYDRALAZINE HCL 20 MG/ML IJ SOLN
5.0000 mg | INTRAMUSCULAR | Status: DC | PRN
  Filled 2012-10-19 (×2): qty 0.5

## 2012-10-19 MED ORDER — VANCOMYCIN 50 MG/ML ORAL SOLUTION
125.0000 mg | Freq: Four times a day (QID) | ORAL | Status: DC
  Administered 2012-10-19 – 2012-10-21 (×7): 125 mg
  Filled 2012-10-19 (×11): qty 2.5

## 2012-10-19 MED ORDER — ALBUTEROL SULFATE (5 MG/ML) 0.5% IN NEBU
2.5000 mg | INHALATION_SOLUTION | Freq: Four times a day (QID) | RESPIRATORY_TRACT | Status: DC
  Administered 2012-10-19 – 2012-10-20 (×6): 2.5 mg via RESPIRATORY_TRACT
  Filled 2012-10-19 (×6): qty 0.5

## 2012-10-19 MED ORDER — SODIUM CHLORIDE 0.9 % IV SOLN
125.0000 mg | INTRAVENOUS | Status: DC
  Administered 2012-10-21: 125 mg via INTRAVENOUS
  Filled 2012-10-19 (×2): qty 10

## 2012-10-19 MED ORDER — MIDAZOLAM HCL 2 MG/2ML IJ SOLN
2.0000 mg | INTRAMUSCULAR | Status: DC | PRN
  Administered 2012-10-19: 2 mg via INTRAVENOUS
  Filled 2012-10-19: qty 2

## 2012-10-19 NOTE — Progress Notes (Signed)
  Echocardiogram 2D Echocardiogram has been performed.  [name] [name] 10/19/2012, 5:51 PM

## 2012-10-19 NOTE — Progress Notes (Signed)
eLink Physician-Brief Progress Note Patient Name: [name] DOB: [birth year] MRN: [id number]  Date of Service  10/19/2012   HPI/Events of Note   On HD  eICU Interventions  Dc lasix   Intervention Category Intermediate Interventions: Medication change / dose adjustment  Lennis Rader V. 10/19/2012, 7:43 PM

## 2012-10-19 NOTE — Consult Note (Signed)
CARDIOLOGY CONSULT NOTE  Patient ID: [name] MRN: [id number] DOB/AGE: [birth year] 72 y.o.  Admit date: 10/19/2012  Suzanne Going, MD Primary Cardiologist   [name]   HPI:  The patient has end-stage renal disease on dialysis. She was admitted to this hospital in December, 2013. She had respiratory failure at that time. Two-dimensional echo revealed an ejection fraction of 55-60%. There was severe mitral annular calcification but no functional mitral stenosis. There has been no formal cardiology workup documented in this record. The patient had only a partial dialysis on October 16, 2012. She presents now with respiratory failure. She is admitted and intubated. Cardiology is asked to see her to help with the evaluation of her EKGs.    Past Medical History  Diagnosis Date  . Carotid artery occlusion     bilat CEA in 2012?  . C. 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 Hypertension   . Atrial fibrillation April  2013  . Pulmonary hypertension   . Secondary hyperparathyroidism, renal   . COPD (chronic obstructive pulmonary disease)   . Hypercholesterolemia   . History of MI (myocardial infarction)     "they say I had 2 years ago" (08/20/2012)  . GERD (gastroesophageal reflux disease)   . History of pneumonia     "have it alot" (08/20/2012)  . Sleep apnea     "don't wear mask anymore"  . Type II diabetes mellitus   . History of blood transfusion 2013    "3 times so far in 2013:  4 pints one time; 2 pints another; ?# last time" (08/20/2012)  . Iron deficiency anemia   . Seizures 2012    after carotid surgery  . ESRD on dialysis     "[place]; Tues, Thurs; Sat"  . Renal cell carcinoma 2005?  Marland Kitchen On home oxygen therapy     "24/7"   . Acute diverticulitis     Oct 2013, treated medically [facility], sigmoid diverticulitits    Family History  Problem Relation Age of Onset  . Cancer Mother     pancreatic  . Stroke Father   .  Coronary artery disease Father   . Cancer Brother     Brain  . Kidney disease Brother     Kidney stones    History   Social History  . Marital Status: Widowed    Spouse Name: N/A    Number of Children: N/A  . Years of Education: N/A   Occupational History  . Not on file.   Social History Main Topics  . Smoking status: Former Smoker -- 1.5 packs/day for 45 years    Types: Cigarettes    Quit date: 09/30/2003  . Smokeless tobacco: Never Used  . Alcohol Use: No  . Drug Use: No  . Sexually Active: No   Other Topics Concern  . Not on file   Social History Narrative  . No narrative on file    Past Surgical History  Procedure Date  . Nephrectomy 2005    Right, for Renal cell carcinoma  . Carotid endarterectomy 2012    bilaterally  . Av fistula placement Jan. 7, 2011    Right  upper arm by Dr. Hart Cook  . Hd catheter Aug. 22, 2013    [facility]  . Appendectomy     childhood  . Abdominal hysterectomy 1971?  Marland Kitchen Coronary angioplasty with stent placement 1990's    "1 total"  . Coronary angioplasty  1990's  . Av fistula repair 2013    right upper arm     Prescriptions prior to admission  Medication Sig Dispense Refill  . albuterol (PROVENTIL) (2.5 MG/3ML) 0.083% nebulizer solution Take 2.5 mg by nebulization every 6 (six) hours as needed. For shortness of breath      . calcium acetate, Phos Binder, (PHOSLYRA) 667 MG/5ML SOLN Take 1,334 mg by mouth 4 (four) times daily.      . carvedilol (COREG) 25 MG tablet Take 25 mg by mouth 2 (two) times daily with a meal.      . ezetimibe (ZETIA) 10 MG tablet Take 10 mg by mouth daily.      . fluticasone (FLOVENT HFA) 110 MCG/ACT inhaler Inhale 1 puff into the lungs 2 (two) times daily.      Marland Kitchen rOPINIRole (REQUIP) 1 MG tablet Take 1 mg by mouth 2 (two) times daily.      . vancomycin (VANCOCIN) 50 mg/mL oral solution Take 125mg  qid x7d then 125mg  bid x 7d, then 125mg  qd x7d then 125mg  qod x7 days, then every 3d x 14 days  240 mL  0     The patient is intubated and sedated. Review of systems cannot be done. I did speak with the patient's family. They said that she had not been having any significant chest pain.  Physical Exam: Blood pressure 109/57, pulse 94, temperature 98.9 F (37.2 C), temperature source Oral, resp. rate 35, height 5' (1.524 m), weight 178 lb 12.7 oz (81.1 kg), SpO2 100.00%.    The patient is intubated and sedated. Neck exam is difficult as it is noisy. Lung exam reveals diffuse rhonchi. Cardiac exam reveals distant heart sounds. Once again the exam is difficult because of noisy respiration. The abdomen is soft. There is no significant peripheral edema.   Labs:   Lab Results  Component Value Date   WBC 10.9* 10/19/2012   HGB 12.1 10/19/2012   HCT 39.8 10/19/2012   MCV 100.8* 10/19/2012   PLT 216 10/19/2012    Lab 10/19/12 1605  NA 138  K 6.4*  CL 94*  CO2 24  BUN 83*  CREATININE 9.29*9.13*  CALCIUM 9.1  PROT 6.9  BILITOT 0.2*  ALKPHOS 86  ALT 10  AST 16  GLUCOSE 186*   Lab Results  Component Value Date   CKTOTAL 77 10/19/2012   CKMB 5.3* 10/19/2012   TROPONINI <0.30 10/19/2012    No results found for this basename: CHOL   No results found for this basename: HDL   No results found for this basename: LDLCALC   No results found for this basename: TRIG   No results found for this basename: CHOLHDL   No results found for this basename: LDLDIRECT      Radiology: Dg Chest Port 1 View  10/19/2012  *RADIOLOGY REPORT*  Clinical Data: Shortness of breath  PORTABLE CHEST - 1 VIEW  Comparison: 10/19/2012 1057 hours  Findings: The heart and pulmonary vascularity are stable.  Diffuse vascular congestion is again identified but mildly improved when compared with prior exam.  An endotracheal tube remains in satisfactory position.  The right jugular line is again noted.  No pneumothorax is seen.  IMPRESSION: Improved vascular congestion when compared with the prior exam although persistent  interstitial changes remain.   Original Report Authenticated By: [name], M.D.    EKG:   I have carefully reviewed the current EKG. The patient does have inverted T waves in leads 1 and aVL. These  are unchanged from the tracing of September 22, 2012. Also currently there is slight J-point elevation in leads V2 and V3. This was also present in the past. There is decreased anterior R wave progression. This is actually less marked than the prior tracing.  ASSESSMENT AND PLAN:    *Acute-on-chronic respiratory failure     This is being managed aggressively by critical care medicine team. The patient is intubated and sedated.   End stage renal disease  DM2 (diabetes mellitus, type 2)  Anemia  COPD (chronic obstructive pulmonary disease)  Acute encephalopathy  C. difficile colitis  The EKG reveals inverted T waves in lead 1 and aVL.   As I have outlined above there is no significant change from the prior tracing. The first troponin is normal. We know that the patient has normal left ventricular Systolic function from December, 2013. Another echo has been ordered for this current admission. At this point there is no evidence of an acute ischemic event causing her problems. We will continue to follow her. It seems that the patient's reliability as it relates to dialysis plays a role with her recurrent events. If the data suggests that ischemia is playing a role we can be more aggressive. At this point there is no definite proof of this.  Signed:  Jerral Bonito, MD  10/19/2012, 6:12 PM

## 2012-10-19 NOTE — Progress Notes (Signed)
Dr. Arrie Aran called and informed of pts K of 6.2 and of her lines not running well(high venous pressure). 1K bath ordered. Pre exsisting hematoma is questionable harder. ? Progression in hematoma on R arm. Pt restuck nabove exsisting venous needle with improvements in pressures. Ice applied to bruised area on R arm.

## 2012-10-19 NOTE — Consult Note (Signed)
Reason for Consult:Provision of dialysis for ESRD, management of anemia, ckd-related bone disease Referring Physician: Dr. Tyson Alias   HPI: [name] is a 72 y.o. female with ESRD, on dialysis TTHSat at the [facility].  Last dialysis was Saturday, and she abbreviated the treatment by 30 minutes for unknown reasons.  According to her daughters who spoke with her last PM, she was "feeling bad" but without specific complaint.  Today complained of SOB.  Seen at [facility] ED - placed on BIPAP, failed, required intubation for respiratory failure.  By report, potassium was 5.9.  I am unsure what treatment she received.  She was transferred here for further management and dialysis.    Dialyzes at [facility] Primary Nephrologist Caryn Section (per family) EDW 76 kg HD Bath 2K, 2.25 Ca Dialyzer F160 Heparin 1600 units Access Right upper arm AVF  15 gauge needles No EPO or Vitamin D 100 mg/week venofer  Past Medical History  Diagnosis Date  . Carotid artery occlusion     bilat CEA in 2012?  . C. 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 Hypertension   . Atrial fibrillation April  2013  . Pulmonary hypertension   . Secondary hyperparathyroidism, renal   . COPD (chronic obstructive pulmonary disease)   . Hypercholesterolemia   . History of MI (myocardial infarction)     "they say I had 2 years ago" (08/20/2012)  . GERD (gastroesophageal reflux disease)   . History of pneumonia     "have it alot" (08/20/2012)  . Sleep apnea     "don't wear mask anymore"  . Type II diabetes mellitus   . History of blood transfusion 2013    "3 times so far in 2013:  4 pints one time; 2 pints another; ?# last time" (08/20/2012)  . Iron deficiency anemia   . Seizures 2012    after carotid surgery  . ESRD on dialysis     "[place]; Tues, Thurs; Sat"  . Renal cell carcinoma 2005?  Marland Kitchen On home oxygen therapy     "24/7"   . Acute diverticulitis     Oct 2013, treated medically [facility],  sigmoid diverticulitits     Allergies  Allergen Reactions  . Morphine And Related Nausea And Vomiting  . Lipitor (Atorvastatin) Other (See Comments)    Causes drowsiness  . Nsaids     Unknown reported by previous hospital    Past Surgical History  Procedure Date  . Nephrectomy 2005    Right, for Renal cell carcinoma  . Carotid endarterectomy 2012    bilaterally  . Av fistula placement Jan. 7, 2011    Right  upper arm by Dr. Hart [place]  . Hd catheter Aug. 22, 2013    [facility]  . Appendectomy     childhood  . Abdominal hysterectomy 1971?  Marland Kitchen Coronary angioplasty with stent placement 1990's    "1 total"  . Coronary angioplasty 1990's  . Av fistula repair 2013    right upper arm    Family History  Problem Relation Age of Onset  . Cancer Mother     pancreatic  . Stroke Father   . Coronary artery disease Father   . Cancer Brother     Brain  . Kidney disease Brother     Kidney stones    Social History:  reports that she quit smoking about 9 years ago. Her smoking use included Cigarettes. She has a 67.5 pack-year smoking history. She has  never used smokeless tobacco. She reports that she does not drink alcohol or use illicit drugs. Lives alone.  2 daughters close by.  ROS: patient is intubated but does not to some questions Acknowledges chest pain, SOB, diarrhea Beyond that ROS not obtainable   Physical Examination: Blood pressure 128/61, pulse 97, resp. rate 35, height 5' (1.524 m), weight 81.1 kg (178 lb 12.7 oz), SpO2 100.00%. Older WF Intubated Awake and opens eyes, follows commands, nods appropriately Sinus Tachycardia S1S2 no S3 Right upper arm AVF with bruit/thrill; some bruising from prioor cannulations Abdomen positive bowel sounds; mild tenderness moreso in RUQ area Traced LE edema No results found for this or any previous visit (from the past 48 hour(s)).  No results found. CXR - diffuse interstitial edema  Assessment  1. ESRD TTHSAT [place]   2. Hyperkalemia 3. VDRF 4. COPD 5. H/O CDiff (by report ? Untreated??)  Plan HD Usual HD meds Other management per CCM  Wilbur Labuda B 10/19/2012, 3:45 PM

## 2012-10-19 NOTE — H&P (Addendum)
PULMONARY  / CRITICAL CARE MEDICINE  Name: [name] MRN: [id number] DOB: [birth year]    LOS: 0  REFERRING MD :  [facility]  CHIEF COMPLAINT:  Shortness of Breath  BRIEF PATIENT DESCRIPTION: 72 year old female with PMH ESRD on HD T,Th,Sat, COPD, CAD, and untreated Cdiff admitted from [facility] for worsening SOB on 1/21 and HD.    LINES / TUBES: ETT 1/21>>> RIJ 1/21>>> RAVF Foley 1/21>>> OGT 1/21>>>  CULTURES: 1/21 - Blood x2 1/21>>> 1/21 - Urine 1/21>>> 1/21 - Sputum 1/21>>> 1/21 - Respiratory virus panel 1/21>>> 1/21 - Influenza PCR >> 1/21 - Urinary Ag Strep Pneumoniae and Legionella >>  ANTIBIOTICS: Vancomycin Oral  SIGNIFICANT EVENTS:  1/21: admitted to [facility] from [place] for Acute respiratory failure and HD  LEVEL OF CARE:  ICU PRIMARY SERVICE:  PCCM CONSULTANTS:  Renal CODE STATUS: FULL DIET:  NPO DVT Px:  Heparin GI Px:  Protonix  HISTORY OF PRESENT ILLNESS:  [name] is a 72 year old female with PMH ESRD on HD (T,Th,Sat), HTN, paroxysmal Afib, CAD (stent), COPD, and untreated Cdiff admitted to [facility] on 10/19/2012 on transfer from [facility] for worsening SOB x2-3 days prior to admission. Her last HD session was 1/18, however, was incomplete (for unknown reasons) per daughter. During [facility] ER course, ABG confirmed significant respiratory acidosis, refractory to BiPAP. Therefore, she was intubated prior to transfer. Of note, the patient had a similar presentation in 08/2012 with missed HD session at that time.   PAST MEDICAL HISTORY :  Past Medical History  Diagnosis Date  . Carotid artery occlusion     bilat CEA in 2012?  . C. 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 Hypertension   . Atrial fibrillation April  2013  . Pulmonary hypertension   . Secondary hyperparathyroidism, renal   . COPD (chronic obstructive pulmonary disease)   . Hypercholesterolemia   . History of MI (myocardial infarction)     "they say I  had 2 years ago" (08/20/2012)  . GERD (gastroesophageal reflux disease)   . History of pneumonia     "have it alot" (08/20/2012)  . Sleep apnea     "don't wear mask anymore"  . Type II diabetes mellitus   . History of blood transfusion 2013    "3 times so far in 2013:  4 pints one time; 2 pints another; ?# last time" (08/20/2012)  . Iron deficiency anemia   . Seizures 2012    after carotid surgery  . ESRD on dialysis     "[place]; Tues, Thurs; Sat"  . Renal cell carcinoma 2005?  Marland Kitchen On home oxygen therapy     "24/7"   . Acute diverticulitis     Oct 2013, treated medically [facility], sigmoid diverticulitits    Past Surgical History  Procedure Date  . Nephrectomy 2005    Right, for Renal cell carcinoma  . Carotid endarterectomy 2012    bilaterally  . Av fistula placement Jan. 7, 2011    Right  upper arm by Dr. Hart [place]  . Hd catheter Aug. 22, 2013    [facility]  . Appendectomy     childhood  . Abdominal hysterectomy 1971?  Marland Kitchen Coronary angioplasty with stent placement 1990's    "1 total"  . Coronary angioplasty 1990's  . Av fistula repair 2013    right upper arm   Medications Prior to Admission  Medication Sig Dispense Refill  . albuterol (PROVENTIL) (  2.5 MG/3ML) 0.083% nebulizer solution Take 2.5 mg by nebulization every 6 (six) hours as needed. For shortness of breath      . calcium acetate, Phos Binder, (PHOSLYRA) 667 MG/5ML SOLN Take 1,334 mg by mouth 4 (four) times daily.      . carvedilol (COREG) 25 MG tablet Take 25 mg by mouth 2 (two) times daily with a meal.      . ezetimibe (ZETIA) 10 MG tablet Take 10 mg by mouth daily.      . fluticasone (FLOVENT HFA) 110 MCG/ACT inhaler Inhale 1 puff into the lungs 2 (two) times daily.      Marland Kitchen rOPINIRole (REQUIP) 1 MG tablet Take 1 mg by mouth 2 (two) times daily.      . vancomycin (VANCOCIN) 50 mg/mL oral solution Take 125mg  qid x7d then 125mg  bid x 7d, then 125mg  qd x7d then 125mg  qod x7 days, then every 3d x 14  days  240 mL  0    ALLERGIES: Allergies  Allergen Reactions  . Morphine And Related Nausea And Vomiting  . Lipitor (Atorvastatin) Other (See Comments)    Causes drowsiness  . Nsaids     Unknown reported by previous hospital    FAMILY HISTORY:  Family History  Problem Relation Age of Onset  . Cancer Mother     pancreatic  . Stroke Father   . Coronary artery disease Father   . Cancer Brother     Brain  . Kidney disease Brother     Kidney stones    SOCIAL HISTORY: History   Social History  . Marital Status: Widowed    Spouse Name: N/A    Number of Children: N/A  . Years of Education: N/A   Social History Main Topics  . Smoking status: Former Smoker -- 1.5 packs/day for 45 years    Types: Cigarettes    Quit date: 09/30/2003  . Smokeless tobacco: Never Used  . Alcohol Use: No  . Drug Use: No  . Sexually Active: No   Other Topics Concern  . Not on file   Social History Narrative  . No narrative on file     REVIEW OF SYSTEMS:  Patient intubated and sedated, therefore, unable to provide history. Level V Caveat applies.  SUBJECTIVE / INTERVAL HISTORY:  1/21- [facility], vent, HTN improved  VITAL SIGNS: Pulse Rate:  [101] 101  (01/21 1426) Resp:  [28] 28  (01/21 1426) BP: (176)/(92) 176/92 mmHg (01/21 1426) SpO2:  [98 %] 98 % (01/21 1426) FiO2 (%):  [50 %] 50 % (01/21 1448) HEMODYNAMICS:   VENTILATOR SETTINGS: Vent Mode:  [-] PRVC FiO2 (%):  [50 %] 50 % Set Rate:  [26 bmp-35 bmp] 35 bmp Vt Set:  [360 mL] 360 mL PEEP:  [5 cmH20] 5 cmH20 Plateau Pressure:  [18 cmH20] 18 cmH20 INTAKE / OUTPUT: Intake/Output    None     PHYSICAL EXAMINATION: General: Vital signs reviewed and noted. Obese, sedated, on mechanical ventilation  Head: Normocephalic, atraumatic.  Lungs:  Coarse b/l, on mechanical ventilator  Heart: RRR. S1 and S2 normal without gallop, murmur, or rubs.  Abdomen:  BS normoactive. Soft, obese, Nondistended  Extremities: No pretibial  edema, +2dp b/l, RAVF  Skin: Chronic venous stasis b/l lower extremities  Neuro: Sedated, Responds to painful stimuli    IMAGING: PCXR 1/21: improved vascular congestion, persistent interstitial changes  ECG: 97 NSR, t wave inversions lateral leads  MEDICATIONS: Infusions    . dextrose 5 % 1,000  mL with sodium bicarbonate 150 mEq infusion    . fentaNYL infusion INTRAVENOUS     Scheduled    . aspirin  300 mg Rectal Daily  . pantoprazole (PROTONIX) IV  40 mg Intravenous Q24H  . vancomycin  125 mg Per Tube Q6H   DIAGNOSES: Active Problems:  Acute respiratory failure  C. difficile colitis  ASSESSMENT / PLAN:  PULMONARY No results found for this basename: PHART:3,PCO2ART:3,PO2ART:3,HCO3:3,TCO2:3 in the last 168 hours A:  VDRF ETT 1/21 P:   On mechanical ventilation, reduce TV from 600 cc, see ideal body wt Known PH last 7.19 from 7.15, will increase rate with reduction in TV Keep o2 sat <92% See ID pcxr for edema eval and ett Control afterload, this is likely the biggest issue with edema and resp failure  CARDIOVASCULAR No results found for this basename: CKTOTAL:3,CKMB:3,TROPONINI:3 in the last 168 hours No results found for this basename: PROBNP:3 in the last 168 hours A:  1) T wave inversions on EKG--lateral leads  2) CHF--08/2012 echo: mild LVH, normal systolic function, EF 55-60%  3) HTN - elevated BPs likely 2/2 volume overload in setting of missed HD.  P:  - ProBNP - Lactic acid - Consult cardiology in setting of new EKG changes. - Cycle CE - AM EKG - F/u Echo - ASA -consider heparin drip after coags, cbc revealed -consider MAP 75, running about that now, avoid lower MAP -beta blocker if BP rises  RENAL No results found for this basename: NA:3,K:3,CL:3,CO2:3,BUN:3,CREATININE:3,GLUCOSE:3 in the last 168 hours A: ESRD on HD T,Th,Sat.  Hx of missing HD sessions. P:  -bicarb until HD - Trend renal panel - Consult renal--HD ASAP -correct Ph -if k  rises will re dose calcium , give insulin, repeat lasix, d50  GASTROINTESTINAL No results found for this basename: AST:3,ALT:3,ALKPHOS:3,BILITOT:3,PROT:3,ALBUMIN:3 in the last 168 hours A: Pseudomembranous colitis--untreated P:  - Oral Vancomycin per tube (during last hospital course dc 12/28, plan for 7 week course of Vancomycin oral) Npo lft  HEMATOLOGIC No results found for this basename: WBC:3,HGB:3,HCT:3,MCV:3,PLT:3,APTT:3,INR:3 in the last 168 hours A: Chronic anemia--baseline Hb~10-11 P:  - Trend CBC -Consider heparin drip for NSTEMI? After coags and plat re evaluated  INFECTIOUS No results found for this basename: WBC:3,NEUTROABS:3,LATICACIDVEN:3,PROCALCITON:3 in the last 168 hours A: 1) Pseudomembranous colitis 2) r/o pNA P:  - Respiratory virus panel - Influenza PCR - Urinary Ag--Strep Pneumoniae and Legionella - F/u respiratory cx, sputum cx, and blood cx x2 -defer ABX, follow fever curve and clinical status  ENDOCRINE / RHEUMATOLOGIC No results found for this basename: GLUCAP:5 in the last 168 hours A: DM2 P:  Phase 1 ICU hyperglycemia protocol  NEUROLOGIC / PSYCHIATRIC A: Acute encephalopathy - secondary to respiratory and likely metabolic acidosis. P:  Continue to monitor to rass -2 Failed propofol, at [facility], was ineffective Dc versed Use monotherapy fent  GLOBAL: Family updated by bedside daughters   CLINICAL SUMMARY: 72 year old female with PMH ESRD on HD T,Th,Sat, COPD, CAD, and untreated Cdiff admitted from [place] for acute respiratory failure on 1/21 and HD. Likely big issue HTN crisis.  ETT 1/21. For emergent HD, treating K .   Signed: Johnette Abraham, Roma Schanz, Internal Medicine Resident Pager: [phone] (7AM-5PM) 10/19/2012, 2:52 PM    Ccm  time 35 min I have fully examined this patient and agree with above findings.    And edited in full  Mcarthur Rossetti. Tyson Alias, MD, FACP Pgr: [phone] [facility]'

## 2012-10-19 NOTE — Progress Notes (Signed)
CRITICAL VALUE ALERT  Critical value received:  K 6.4   Date of notification:  10/19/12  Time of notification:  1652  Critical value read back:yes  Nurse who received alert:  Rudi Heap RN  MD notified (1st page):  Tyson Alias  Time of first page:  16:55  MD notified (2nd page):  Time of second page:  Responding MD:  Tyson Alias  Time MD responded:  16:55

## 2012-10-20 ENCOUNTER — Inpatient Hospital Stay (HOSPITAL_COMMUNITY): Payer: Medicare Other

## 2012-10-20 LAB — BASIC METABOLIC PANEL
BUN: 26 mg/dL — ABNORMAL HIGH (ref 6–23)
Calcium: 8.7 mg/dL (ref 8.4–10.5)
Creatinine, Ser: 4.19 mg/dL — ABNORMAL HIGH (ref 0.50–1.10)
GFR calc non Af Amer: 10 mL/min — ABNORMAL LOW (ref >90)
Glucose, Bld: 198 mg/dL — ABNORMAL HIGH (ref 70–99)

## 2012-10-20 LAB — CBC WITH DIFFERENTIAL/PLATELET
Eosinophils Absolute: 0 10*3/uL (ref 0.0–0.7)
Lymphocytes Relative: 7 % — ABNORMAL LOW (ref 12–46)
Lymphs Abs: 0.9 10*3/uL (ref 0.7–4.0)
Neutrophils Relative %: 91 % — ABNORMAL HIGH (ref 43–77)
Platelets: 201 10*3/uL (ref 150–400)
RBC: 3.66 MIL/uL — ABNORMAL LOW (ref 3.87–5.11)
WBC: 13.7 10*3/uL — ABNORMAL HIGH (ref 4.0–10.5)

## 2012-10-20 LAB — BLOOD GAS, ARTERIAL
Bicarbonate: 30.5 mEq/L — ABNORMAL HIGH (ref 20.0–24.0)
PEEP: 5 cmH2O
Patient temperature: 98.6
pCO2 arterial: 40.1 mmHg (ref 35.0–45.0)
pH, Arterial: 7.493 — ABNORMAL HIGH (ref 7.350–7.450)
pO2, Arterial: 134 mmHg — ABNORMAL HIGH (ref 80.0–100.0)

## 2012-10-20 LAB — RENAL FUNCTION PANEL
Albumin: 2.7 g/dL — ABNORMAL LOW (ref 3.5–5.2)
Chloride: 97 mEq/L (ref 96–112)
Creatinine, Ser: 4.75 mg/dL — ABNORMAL HIGH (ref 0.50–1.10)
GFR calc non Af Amer: 8 mL/min — ABNORMAL LOW (ref >90)
Potassium: 4.1 mEq/L (ref 3.5–5.1)

## 2012-10-20 LAB — LEGIONELLA ANTIGEN, URINE: Legionella Antigen, Urine: NEGATIVE

## 2012-10-20 LAB — MAGNESIUM: Magnesium: 2.2 mg/dL (ref 1.5–2.5)

## 2012-10-20 LAB — HEPATITIS B SURFACE ANTIGEN: Hepatitis B Surface Ag: NEGATIVE

## 2012-10-20 LAB — GLUCOSE, CAPILLARY
Glucose-Capillary: 106 mg/dL — ABNORMAL HIGH (ref 70–99)
Glucose-Capillary: 132 mg/dL — ABNORMAL HIGH (ref 70–99)

## 2012-10-20 MED ORDER — ASPIRIN 81 MG PO CHEW
CHEWABLE_TABLET | ORAL | Status: AC
  Administered 2012-10-20: 81 mg
  Filled 2012-10-20: qty 1

## 2012-10-20 MED ORDER — FENTANYL CITRATE 0.05 MG/ML IJ SOLN: 25.0000 ug | INTRAMUSCULAR | Status: DC | PRN

## 2012-10-20 MED ORDER — PNEUMOCOCCAL VAC POLYVALENT 25 MCG/0.5ML IJ INJ
0.5000 mL | INJECTION | INTRAMUSCULAR | Status: AC
  Administered 2012-10-21: 0.5 mL via INTRAMUSCULAR
  Filled 2012-10-20: qty 0.5

## 2012-10-20 MED ORDER — ROPINIROLE HCL 1 MG PO TABS
1.0000 mg | ORAL_TABLET | Freq: Two times a day (BID) | ORAL | Status: DC
  Administered 2012-10-20 – 2012-10-23 (×6): 1 mg via ORAL
  Filled 2012-10-20 (×7): qty 1

## 2012-10-20 MED ORDER — ROPINIROLE HCL 1 MG PO TABS
1.0000 mg | ORAL_TABLET | Freq: Two times a day (BID) | ORAL | Status: DC
  Filled 2012-10-20: qty 1

## 2012-10-20 MED ORDER — ONDANSETRON HCL 4 MG/2ML IJ SOLN
4.0000 mg | Freq: Once | INTRAMUSCULAR | Status: AC
  Administered 2012-10-20: 4 mg via INTRAVENOUS
  Filled 2012-10-20: qty 2

## 2012-10-20 MED ORDER — ASPIRIN EC 81 MG PO TBEC
81.0000 mg | DELAYED_RELEASE_TABLET | Freq: Every day | ORAL | Status: DC
  Administered 2012-10-21 – 2012-10-23 (×3): 81 mg via ORAL
  Filled 2012-10-20 (×4): qty 1

## 2012-10-20 MED ORDER — METOPROLOL TARTRATE 1 MG/ML IV SOLN: 2.5000 mg | INTRAVENOUS | Status: DC | PRN

## 2012-10-20 MED ORDER — CARVEDILOL 25 MG PO TABS
25.0000 mg | ORAL_TABLET | Freq: Two times a day (BID) | ORAL | Status: DC
  Administered 2012-10-20 – 2012-10-22 (×5): 25 mg via ORAL
  Filled 2012-10-20 (×9): qty 1

## 2012-10-20 MED ORDER — BIOTENE DRY MOUTH MT LIQD
15.0000 mL | Freq: Two times a day (BID) | OROMUCOSAL | Status: DC
  Administered 2012-10-21 – 2012-10-22 (×2): 15 mL via OROMUCOSAL

## 2012-10-20 NOTE — Progress Notes (Signed)
Subjective:  Extubated Feels "OK" Still has cough She says cough and sputum production main symptoms since last Friday No diarrhea (states has been on Q3Day Rx for CDiff - prolonged course d/t issues with recurrence  Objective:     Vital signs in last 24 hours: Filed Vitals:   10/20/12 0800 10/20/12 0845 10/20/12 0900 10/20/12 0904  BP: 174/76  172/87   Pulse: 114 136 117   Temp:      TempSrc:      Resp: 17 21 21    Height:      Weight:      SpO2: 97% 95% 100% 97%   Weight change:   Intake/Output Summary (Last 24 hours) at 10/20/12 1006 Last data filed at 10/20/12 0900  Gross per 24 hour  Intake 941.97 ml  Output   2344 ml  Net -1402.03 ml    Physical Exam:  Blood pressure 172/87, pulse 117, temperature 97.9 F (36.6 C), temperature source Oral, resp. rate 21, height 5' (1.524 m), weight 79.5 kg (175 lb 4.3 oz), SpO2 97.00%. Nice older WF Tachy around 100 Extubated Coarse BS Abdomen soft Right AVF with dressings in place Good bruit and thrill No edema of LE's  Labs:   Lab 10/20/12 0427 10/20/12 0100 10/19/12 1605  NA 140 138 138  K 4.1 3.6 6.4*  CL 97 96 94*  CO2 32 30 24  GLUCOSE 148* 198* 186*  BUN 31* 26* 83*  CREATININE 4.75* 4.19* 9.29*9.13*  ALB -- -- --  CALCIUM 9.0 8.7 9.1  PHOS 4.0 -- 6.4*     Lab 10/20/12 0427 10/19/12 1605  AST -- 16  ALT -- 10  ALKPHOS -- 86  BILITOT -- 0.2*  PROT -- 6.9  ALBUMIN 2.7* 2.9*2.9*   Lab 10/20/12 0427 10/19/12 1605  WBC 13.7* 10.9*  NEUTROABS 12.4* 10.1*  HGB 11.3* 12.1  HCT 36.6 39.8  MCV 100.0 100.8*  PLT 201 216    Lab 10/20/12 0427 10/19/12 1920 10/19/12 1605  CKTOTAL -- -- 77  CKMB -- -- 5.3*  CKMBINDEX -- -- --  TROPONINI <0.30 <0.30 <0.30     Lab 10/20/12 0703 10/20/12 0334 10/19/12 2313 10/19/12 2020  GLUCAP 100* 168* 127* 147*     No results found for this basename: IRON:30,TIBC:30,TRANSFERRIN:30,FERRITIN:30 in the last 168 hours  Studies/Results: Dg Chest Port 1  View  10/20/2012  *RADIOLOGY REPORT*  Clinical Data: Ventilator.  Respiratory failure.  PORTABLE CHEST - 1 VIEW  Comparison: 10/19/2012  Findings: Interval removal of endotracheal tube.  Right central line is unchanged.  Interval placement of NG tube.  Continued diffuse interstitial prominence/disease.  Heart is borderline in size.  Left retrocardiac opacity, likely atelectasis.  No visible effusions.  IMPRESSION: Interval extubation. Otherwise no significant change.   Original Report Authenticated By: Charlett Nose, M.D.    Dg Chest Port 1 View  10/19/2012  *RADIOLOGY REPORT*  Clinical Data: Shortness of breath  PORTABLE CHEST - 1 VIEW  Comparison: 10/19/2012 1057 hours  Findings: The heart and pulmonary vascularity are stable.  Diffuse vascular congestion is again identified but mildly improved when compared with prior exam.  An endotracheal tube remains in satisfactory position.  The right jugular line is again noted.  No pneumothorax is seen.  IMPRESSION: Improved vascular congestion when compared with the prior exam although persistent interstitial changes remain.   Original Report Authenticated By: Alcide Clever, M.D.          . albuterol  2.5 mg Nebulization Q6H  .  antiseptic oral rinse  15 mL Mouth Rinse QID  . aspirin      . aspirin EC  81 mg Oral Daily  . carvedilol  25 mg Oral BID WC  . chlorhexidine  15 mL Mouth Rinse BID  . ferric gluconate (FERRLECIT/NULECIT) IV  125 mg Intravenous Q Thu-HD  . heparin  5,000 Units Subcutaneous Q8H  . insulin aspart  1-3 Units Subcutaneous Q4H  . vancomycin  125 mg Per Tube Q6H    I  have reviewed scheduled and prn medications.  Dialysis at [facility]  Primary Nephrologist Caryn Section (per family)  EDW 76 kg  HD Bath 2K, 2.25 Ca  Dialyzer F160  Heparin 1600 units  Access Right upper arm AVF 15 gauge needles  No EPO or Vitamin D  100 mg/week venofer  ASSESSMENT/RECOMMENDATIONS ESRD - cont TTS HD ([facility]) Hyperkalemia  Resolved with  HD VDRF  Extubated  Has "COPD"  Wonder about chronic interstitial lung ds based on appearance of most all CXR's in the system (Dr. Philemon Kingdom = pulmonologist)  Has Home O2 COPD CAD  Lat t wave changes  trops neg H/O CDiff  Admit notes say "untreated"  Per daughter has been on slow taper and is on Q3D Rx  Primary has Rx 125 QID vanco  Will investigate via her pharmacy Other issues per primary service     [name], MD [facility] [phone] Pager 10/20/2012, 10:06 AM

## 2012-10-20 NOTE — Progress Notes (Addendum)
I was able to speak with pharmacists at [facility], [facility], and [facility] to verify treatment history for Ms. Raisch's C. Difficile infection. I cannot confirm that these treatments were completed, but they were all picked up on the days listed.  Please see below for treatment dates and regimens.   [facility] hospital: Apr 7: Flagyl 500 TID x 7 days Apr 12: vanc 250 TID x 7 days Apr 18: vanc 125 QID x 14 days + Flagyl 500 TID x 7 days Was at rehab facility Apr 17 for 9 days   [facility] in [place] May 13: Flagyl 500mg  PO TID x 7 days May 22: vanc 125mg  PO QID x 10 days Jun 12: vanc 125mg  PO QID x 10 days Sept 13: Flagyl 500mg  TID x 10 days Oct 3: Flagyl 500mg  PO TID x 10 day   [facility] Nov 7: vanc 125mg  QID x 7 days, TID x 14 days, BID x 14 days Dec 28: vanc 125mg  QID x 7 days, BID x 7 days Jan 4: vanc 125 TID x 14 days    Tomi Bamberger, PharmD Clinical Pharmacist Pager: [phone] Pharmacy: [phone] 10/20/2012 3:12 PM

## 2012-10-20 NOTE — Progress Notes (Addendum)
PULMONARY  / CRITICAL CARE MEDICINE  Name: [name] MRN: [id number] DOB: 04/02/1941    LOS: 1  REFERRING MD :  Dr [facility]  CHIEF COMPLAINT:  Shortness of Breath  BRIEF PATIENT DESCRIPTION: 72 year old female with PMH ESRD on HD T,Th,Sat, COPD, CAD, and untreated Cdiff admitted from [facility] for worsening SOB on 1/21 and HD.    LINES / TUBES: ETT 1/21>>> RIJ 1/21>>> RAVF Foley 1/21>>> OGT 1/21>>>  CULTURES: 1/21 - Blood x2 1/21>>> 1/21 - Urine 1/21>>> 1/21 - Sputum 1/21>>> 1/21 - Respiratory virus panel 1/21>>> 1/21 - Influenza PCR >> 1/21 - Urinary Ag Strep Pneumoniae and Legionella >>  ANTIBIOTICS: Vancomycin Oral 1/21 (needs 14 days)>>>  SIGNIFICANT EVENTS:  1/21: admitted to [facility] from [place] for Acute respiratory failure and HD.  HD done.   LEVEL OF CARE:  ICU PRIMARY SERVICE:  PCCM CONSULTANTS:  Renal CODE STATUS: FULL DIET:  NPO DVT Px:  Heparin GI Px:  Protonix  HISTORY OF PRESENT ILLNESS:  [name] is a 72 year old female with PMH ESRD on HD (T,Th,Sat), HTN, paroxysmal Afib, CAD (stent), COPD, and untreated Cdiff admitted to [facility] on 10/19/2012 on transfer from [facility] for worsening SOB x2-3 days prior to admission. Her last HD session was 1/18, however, was incomplete (for unknown reasons) per daughter. During [facility] ER course, ABG confirmed significant respiratory acidosis, refractory to BiPAP. Therefore, she was intubated prior to transfer. Of note, the patient had a similar presentation in 08/2012 with missed HD session at that time.  SUBJECTIVE / INTERVAL HISTORY:  1/21- [facility], vent, HTN, HD done  VITAL SIGNS: Temp:  [96.6 F (35.9 C)-98.9 F (37.2 C)] 97.9 F (36.6 C) (01/22 0739) Pulse Rate:  [92-128] 128  (01/22 0721) Resp:  [16-35] 22  (01/22 0721) BP: (76-201)/(48-124) 172/81 mmHg (01/22 0720) SpO2:  [96 %-100 %] 96 % (01/22 0721) FiO2 (%):  [40 %-50 %] 40 % (01/22 0720) Weight:  [175 lb 4.3 oz (79.5  kg)-179 lb 14.3 oz (81.6 kg)] 175 lb 4.3 oz (79.5 kg) (01/22 0500) HEMODYNAMICS:   VENTILATOR SETTINGS: Vent Mode:  [-] PSV;CPAP FiO2 (%):  [40 %-50 %] 40 % Set Rate:  [26 bmp-35 bmp] 35 bmp Vt Set:  [360 mL] 360 mL PEEP:  [5 cmH20] 5 cmH20 Pressure Support:  [5 cmH20] 5 cmH20 Plateau Pressure:  [16 cmH20-18 cmH20] 16 cmH20 INTAKE / OUTPUT: Intake/Output      01/21 0701 - 01/22 0700 01/22 0701 - 01/23 0700   I.V. (mL/kg) 602 (7.6)    NG/GT 190    IV Piggyback 110    Total Intake(mL/kg) 902 (11.3)    Urine (mL/kg/hr) 270 (0.1)    Other 2074    Total Output 2344    Net -1442         Emesis Occurrence 2 x 1 x     PHYSICAL EXAMINATION: General: Vital signs reviewed and noted. Obese, sedated, on mechanical ventilation  Head: Normocephalic, atraumatic.  Lungs:  Coarse b/l, on mechanical ventilator  Heart: RRR. S1 and S2 normal without gallop, murmur, or rubs.  Abdomen:  BS normoactive. Soft, obese, Nondistended  Extremities: No pretibial edema, +2dp b/l, RAVF  Skin: Chronic venous stasis b/l lower extremities  Neuro: Sedated, Responds to painful stimuli    IMAGING: PCXR 1/21: improved vascular congestion, persistent interstitial changes PCXR 1/22:   ECG: 97 NSR, t wave inversions lateral leads  MEDICATIONS: Infusions    . fentaNYL infusion INTRAVENOUS  Stopped (10/20/12 0700)   Scheduled    . albuterol  2.5 mg Nebulization Q6H  . antiseptic oral rinse  15 mL Mouth Rinse QID  . aspirin  300 mg Rectal Daily  . aspirin  300 mg Rectal NOW  . chlorhexidine  15 mL Mouth Rinse BID  . ferric gluconate (FERRLECIT/NULECIT) IV  125 mg Intravenous Q Thu-HD  . heparin  5,000 Units Subcutaneous Q8H  . insulin aspart  1-3 Units Subcutaneous Q4H  . pantoprazole (PROTONIX) IV  40 mg Intravenous Q24H  . vancomycin  125 mg Per Tube Q6H   DIAGNOSES: Principal Problem:  *Acute-on-chronic respiratory failure Active Problems:  End stage renal disease  C. difficile colitis  DM2  (diabetes mellitus, type 2)  Anemia  COPD (chronic obstructive pulmonary disease)  Acute encephalopathy  ASSESSMENT / PLAN:  PULMONARY  Lab 10/20/12 0141 10/19/12 1623  PHART 7.493* 7.404  PCO2ART 40.1 41.3  PO2ART 134.0* 123.0*  HCO3 30.5* 25.8*  TCO2 31.7 27   A:  VDRF ETT 1/21 P:   WUA/SBT--wean today, extubation today. Keep o2 sat <92% See ID Control afterload, this is likely the biggest issue with edema and resp failure  CARDIOVASCULAR  Lab 10/20/12 0427 10/19/12 1920 10/19/12 1605  CKTOTAL -- -- 77  CKMB -- -- 5.3*  TROPONINI <0.30 <0.30 <0.30    Lab 10/19/12 1605  PROBNP 17356.0*   A:  1) T wave inversions on EKG--lateral leads--trop x3 neg 2) CHF--08/2012 echo: mild LVH, normal systolic function, EF 55-60%  3) HTN - elevated BPs likely 2/2 volume overload in setting of missed HD. On Coreg 25mg  BID at home. P:  Lactic acid 1.6 Cardiology consulted--Dr. Novella Rob evidence of acute ischemic event at this time AM EKG F/u Echo ASA Start BB consider MAP 75, running about that now, avoid lower MAP Restart Coreg 25 BID PRN metoprolol.  RENAL  Lab 10/20/12 0427 10/20/12 0100 10/19/12 1605  NA 140 138 138  K 4.1 3.6 6.4*  CL 97 96 94*  CO2 32 30 24  BUN 31* 26* 83*  CREATININE 4.75* 4.19* 9.29*9.13*  GLUCOSE 148* 198* 186*   A: ESRD on HD T,Th,Sat.  Hx of missing HD sessions. HD done 1/21 2) Anion gap metabolic acidosis--resolved P:  AG 20-->11 D/c bicarb AM renal panel Renal following Correct electrolytes  GASTROINTESTINAL  Lab 10/20/12 0427 10/19/12 1605  AST -- 16  ALT -- 10  ALKPHOS -- 86  BILITOT -- 0.2*  PROT -- 6.9  ALBUMIN 2.7* 2.9*2.9*   A: Pseudomembranous colitis--untreated P:  Oral Vancomycin per tube (during last hospital course dc 12/28, plan for 7 week course of Vancomycin oral) Npo  HEMATOLOGIC  Lab 10/20/12 0427 10/19/12 1605  WBC 13.7* 10.9*  HGB 11.3* 12.1  HCT 36.6 39.8  MCV 100.0 100.8*  PLT 201 216  APTT  -- --  INR -- --   A: Chronic anemia--baseline Hb~10-11 P:  Trend CBC  INFECTIOUS  Lab 10/20/12 0427 10/19/12 1605  WBC 13.7* 10.9*  NEUTROABS 12.4* 10.1*  LATICACIDVEN -- 1.6  PROCALCITON -- --   A: 1) Pseudomembranous colitis 2) r/o pNA P:  F/u Respiratory virus panel F/u Influenza PCR Urinary Ag--Strep Pneumoniae neg and Legionella pending F/u respiratory cx, sputum cx, and blood cx x2 PO Vanc for 14 days, started 1/21 plan a 14 day course.  ENDOCRINE / RHEUMATOLOGIC  Lab 10/20/12 0703 10/20/12 0334 10/19/12 2313 10/19/12 2020  GLUCAP 100* 168* 127* 147*   A: DM2 P:  Phase 1 ICU hyperglycemia protocol  NEUROLOGIC / PSYCHIATRIC A: Acute encephalopathy - secondary to respiratory and likely metabolic acidosis. P:  Continue to monitor to rass -2 Failed propofol, at [facility], was ineffective Dc versed Use monotherapy fentanyl  GLOBAL: Family updated by bedside daughters  CLINICAL SUMMARY: 72 year old female with PMH ESRD on HD T,Th,Sat, COPD, CAD, and untreated Cdiff admitted from [place] for acute respiratory failure on 1/21 and HD. Likely big issue HTN crisis.  ETT 1/21. HD done 1/21.    Signed: Darden Palmer, MD PGY-I, Internal Medicine Resident Pager: [phone] (7PM-7AM) 10/20/2012,7:52 AM  Ccm  time 35 min  I have fully examined this patient and agree with above findings.    And edited in full  Patient seen and examined, agree with above note.  I dictated the care and orders written for this patient under my direction.  Alyson Reedy, MD [phone]

## 2012-10-20 NOTE — Progress Notes (Signed)
   SUBJECTIVE: intubated but alert and responsive.    Filed Vitals:   10/20/12 0700 10/20/12 0720 10/20/12 0721 10/20/12 0739  BP: 172/81 172/81    Pulse: 106 123 128   Temp:    97.9 F (36.6 C)  TempSrc:    Oral  Resp: 35 16 22   Height:      Weight:      SpO2: 99% 96% 96%     Intake/Output Summary (Last 24 hours) at 10/20/12 0827 Last data filed at 10/20/12 0700  Gross per 24 hour  Intake 901.97 ml  Output   2344 ml  Net -1442.03 ml    LABS: Basic Metabolic Panel:  Basename 10/20/12 0427 10/20/12 0100 10/19/12 1605  NA 140 138 --  K 4.1 3.6 --  CL 97 96 --  CO2 32 30 --  GLUCOSE 148* 198* --  BUN 31* 26* --  CREATININE 4.75* 4.19* --  CALCIUM 9.0 8.7 --  MG 2.2 -- --  PHOS 4.0 -- 6.4*   Liver Function Tests:  Basename 10/20/12 0427 10/19/12 1605  AST -- 16  ALT -- 10  ALKPHOS -- 86  BILITOT -- 0.2*  PROT -- 6.9  ALBUMIN 2.7* 2.9*2.9*   No results found for this basename: LIPASE:2,AMYLASE:2 in the last 72 hours CBC:  Basename 10/20/12 0427 10/19/12 1605  WBC 13.7* 10.9*  NEUTROABS 12.4* 10.1*  HGB 11.3* 12.1  HCT 36.6 39.8  MCV 100.0 100.8*  PLT 201 216   Cardiac Enzymes:  Basename 10/20/12 0427 10/19/12 1920 10/19/12 1605  CKTOTAL -- -- 77  CKMB -- -- 5.3*  CKMBINDEX -- -- --  TROPONINI <0.30 <0.30 <0.30   BNP: No components found with this basename: POCBNP:3 D-Dimer: No results found for this basename: DDIMER:2 in the last 72 hours Hemoglobin A1C: No results found for this basename: HGBA1C in the last 72 hours Fasting Lipid Panel: No results found for this basename: CHOL,HDL,LDLCALC,TRIG,CHOLHDL,LDLDIRECT in the last 72 hours Thyroid Function Tests: No results found for this basename: TSH,T4TOTAL,FREET3,T3FREE,THYROIDAB in the last 72 hours Anemia Panel: No results found for this basename: VITAMINB12,FOLATE,FERRITIN,TIBC,IRON,RETICCTPCT in the last 72 hours   PHYSICAL EXAM General: Well developed, well nourished, in no acute  distress HEENT:  Normocephalic and atramatic Neck:  No JVD.  Lungs: Clear bilaterally to auscultation , intubated Heart: tachycardiac . Normal S1 and S2 without gallops or murmurs.  Abdomen: Bowel sounds are positive, abdomen soft and non-tender  Msk:  Back normal, normal gait. Normal strength and tone for age. Extremities: No clubbing, cyanosis or edema.   Neuro: Alert  Psych:  Good affect, responds appropriately  TELEMETRY: Reviewed telemetry pt in sinus tachycardia.  ASSESSMENT AND PLAN:  1. Acute-on-chronic respiratory failure: likely volume overload in setting of ESRD on HD. Normal EF by echo in 08/2012.   2. End stage renal disease: management per nephrology.  3. Non specific lateral TW changes: likely supply demand. Cardiac enzymes negative. Recommend BP control. Resume beta blocker.    Lorine Bears, MD, [facility] 10/20/2012 8:27 AM

## 2012-10-20 NOTE — Progress Notes (Signed)
Clinical Social Work Department BRIEF PSYCHOSOCIAL ASSESSMENT 10/20/2012  Patient:  [name], [name]     Account Number:  [id number]     Admit date:  10/19/2012  Clinical Social Worker:  Dennison Bulla  Date/Time:  10/20/2012 10:00 AM  Referred by:  Physician  Date Referred:  10/20/2012 Referred for  SNF Placement   Other Referral:   Interview type:  Patient Other interview type:    PSYCHOSOCIAL DATA Living Status:  ALONE Admitted from facility:   Level of care:   Primary support name:  Pat Primary support relationship to patient:  CHILD, ADULT Degree of support available:   Strong    CURRENT CONCERNS Current Concerns  Post-Acute Placement   Other Concerns:    SOCIAL WORK ASSESSMENT / PLAN CSW received referral due to patient living alone and leaving dialysis early. CSW reviewed chart and spoke with pharmacist. Pharmacist requests assistance in determining where patient has gone to rehab in the past in order to get records regarding medications.    CSW met with patient and dtr at bedside. Patient agreeable to dtr involvement. CSW introduced myself and explained role. Patient reports she was living at home PTA. CSW explained pharmacist request. CSW provided SNF list and patient was able to remember that she stayed at [facility]. CSW received permission to contact SNF regarding medications. CSW called and left Cook message with admissions. CSW asked patient and dtr to review SNF list and to start thinking about dc plans. CSW explained that CSW could assist with SNF placement if needed again.    CSW will continue to follow.   Assessment/plan status:  Psychosocial Support/Ongoing Assessment of Needs Other assessment/ plan:   Information/referral to community resources:   SNF list    PATIENT'S/FAMILY'S RESPONSE TO PLAN OF CARE: Patient alert and oriented. Patient and dtr engaged in assessment and agreeable to CSW follow up. Patient thanked CSW for time.

## 2012-10-20 NOTE — Progress Notes (Signed)
SLP Cancellation Note  Patient Details Name: [name] MRN: [id number] DOB: 10-Oct-1940   Cancelled treatment:       Reason Eval/Treat Not Completed: Other (comment) (order cancelled). RN confirms that no swallow eval is needed.    Haya Hemler, Riley Nearing 10/20/2012, 2:46 PM

## 2012-10-20 NOTE — Care Management Note (Unsigned)
    Page 1 of 1   10/22/2012     3:57:10 PM   CARE MANAGEMENT NOTE 10/22/2012  Patient:  [name], [name]   Account Number:  [id number]  Date Initiated:  10/20/2012  Documentation initiated by:  [facility]  Subjective/Objective Assessment:   resp failure.     Action/Plan:   PTA, PT RESIDES AT HOME ALONE.  SHE HAS SUPPORTIVE DAUGHTER.   Anticipated DC Date:  10/27/2012   Anticipated DC Plan:  SKILLED NURSING FACILITY  In-house referral  Clinical Social Worker      DC Planning Services  CM consult      Choice offered to / List presented to:             Status of service:  In process, will continue to follow Medicare Important Message given?   (If response is "NO", the following Medicare IM given date fields will be blank) Date Medicare IM given:   Date Additional Medicare IM given:    Discharge Disposition:    Per UR Regulation:  Reviewed for med. necessity/level of care/duration of stay  If discussed at Long Length of Stay Meetings, dates discussed:    Comments:  Contact:  Craven,Pat Daughter     [phone]                 Dominic Pea Daughter [phone]  10/22/12 Rosalita Chessman [phone] PT/OT RECOMMENDING SNF FOR REHAB--PT REFUSING CURRENTLY. CSW TO REVISIT PT TODAY.  IF STILL REFUSES, PT WILL NEED HOME HEALTH SET UP.  WILL FOLLOW.  10-20-12 9:50am Avie Arenas, RNBSN [phone] Lives at alone -  ?? compliance - remains on vent. SW consult placed -

## 2012-10-21 ENCOUNTER — Inpatient Hospital Stay (HOSPITAL_COMMUNITY): Payer: Medicare Other

## 2012-10-21 ENCOUNTER — Encounter (HOSPITAL_COMMUNITY): Payer: Self-pay | Admitting: *Deleted

## 2012-10-21 LAB — CULTURE, RESPIRATORY W GRAM STAIN

## 2012-10-21 LAB — CBC
MCH: 30.2 pg (ref 26.0–34.0)
MCHC: 29.3 g/dL — ABNORMAL LOW (ref 30.0–36.0)
MCV: 103 fL — ABNORMAL HIGH (ref 78.0–100.0)
Platelets: 188 10*3/uL (ref 150–400)
RDW: 16.7 % — ABNORMAL HIGH (ref 11.5–15.5)

## 2012-10-21 LAB — RESPIRATORY VIRUS PANEL
Adenovirus: NOT DETECTED
Influenza A H1: NOT DETECTED
Influenza A H3: NOT DETECTED
Influenza A: NOT DETECTED
Influenza B: NOT DETECTED
Metapneumovirus: NOT DETECTED
Respiratory Syncytial Virus A: NOT DETECTED

## 2012-10-21 LAB — RENAL FUNCTION PANEL
Calcium: 8.5 mg/dL (ref 8.4–10.5)
Creatinine, Ser: 6.52 mg/dL — ABNORMAL HIGH (ref 0.50–1.10)
GFR calc Af Amer: 7 mL/min — ABNORMAL LOW (ref >90)
GFR calc non Af Amer: 6 mL/min — ABNORMAL LOW (ref >90)
Phosphorus: 8.6 mg/dL — ABNORMAL HIGH (ref 2.3–4.6)
Sodium: 140 mEq/L (ref 135–145)

## 2012-10-21 LAB — GLUCOSE, CAPILLARY: Glucose-Capillary: 91 mg/dL (ref 70–99)

## 2012-10-21 MED ORDER — MIDAZOLAM HCL 2 MG/2ML IJ SOLN
INTRAMUSCULAR | Status: AC
  Filled 2012-10-21: qty 4

## 2012-10-21 MED ORDER — ALBUTEROL SULFATE (5 MG/ML) 0.5% IN NEBU
2.5000 mg | INHALATION_SOLUTION | Freq: Three times a day (TID) | RESPIRATORY_TRACT | Status: DC
  Administered 2012-10-21 – 2012-10-23 (×5): 2.5 mg via RESPIRATORY_TRACT
  Filled 2012-10-21 (×6): qty 0.5

## 2012-10-21 MED ORDER — DARBEPOETIN ALFA-POLYSORBATE 60 MCG/0.3ML IJ SOLN
INTRAMUSCULAR | Status: AC
  Administered 2012-10-21: 60 ug via INTRAVENOUS
  Filled 2012-10-21: qty 0.3

## 2012-10-21 MED ORDER — DARBEPOETIN ALFA-POLYSORBATE 60 MCG/0.3ML IJ SOLN
60.0000 ug | INTRAMUSCULAR | Status: DC
  Administered 2012-10-21: 60 ug via INTRAVENOUS
  Filled 2012-10-21: qty 0.3

## 2012-10-21 MED ORDER — FENTANYL CITRATE 0.05 MG/ML IJ SOLN
INTRAMUSCULAR | Status: AC
  Filled 2012-10-21: qty 4

## 2012-10-21 MED ORDER — SODIUM CHLORIDE 0.9 % IV SOLN: 125.0000 mg | INTRAVENOUS | Status: DC

## 2012-10-21 MED ORDER — CALCIUM ACETATE (PHOS BINDER) 667 MG/5ML PO SOLN
1334.0000 mg | Freq: Four times a day (QID) | ORAL | Status: DC
  Administered 2012-10-21 (×2): 1334 mg via ORAL
  Filled 2012-10-21 (×8): qty 10

## 2012-10-21 MED ORDER — DARBEPOETIN ALFA-POLYSORBATE 60 MCG/0.3ML IJ SOLN: 60.0000 ug | INTRAMUSCULAR | Status: DC
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
## 2012-10-21 NOTE — Progress Notes (Signed)
Subjective: feels good Extubated yesterday Up in the chair Chest sore from coughing  Objective:     Vital signs in last 24 hours: Filed Vitals:   10/21/12 0200 10/21/12 0300 10/21/12 0400 10/21/12 0500  BP: 143/70   145/56  Pulse: 89 89 90 88  Temp:   98.2 F (36.8 C)   TempSrc:   Oral   Resp: 17 22 19 16   Height:      Weight:      SpO2: 99% 99% 98% 98%   Weight change:   Intake/Output Summary (Last 24 hours) at 10/21/12 0657 Last data filed at 10/21/12 0300  Gross per 24 hour  Intake  972.5 ml  Output      0 ml  Net  972.5 ml    Physical Exam:  Blood pressure 145/56, pulse 88, temperature 98.2 F (36.8 C), temperature source Oral, resp. rate 16, height 5' (1.524 m), weight 79.5 kg (175 lb 4.3 oz), SpO2 98.00%. Up in chair Afebrile  Right IJ 3 lumen Lungs diminished breath sounds S1S2 no S3 Abdomen soft No edema Right AVF with bruit and thrill  Labs:   Lab 10/21/12 0500 10/20/12 0427 10/20/12 0100 10/19/12 1605  NA 140 140 138 138  K 4.5 4.1 3.6 6.4*  CL 95* 97 96 94*  CO2 30 32 30 24  GLUCOSE 92 148* 198* 186*  BUN 51* 31* 26* 83*  CREATININE 6.52* 4.75* 4.19* 9.29*9.13*  ALB -- -- -- --  CALCIUM 8.5 9.0 8.7 9.1  PHOS 8.6* 4.0 -- 6.4*     Lab 10/21/12 0500 10/20/12 0427 10/19/12 1605  AST -- -- 16  ALT -- -- 10  ALKPHOS -- -- 86  BILITOT -- -- 0.2*  PROT -- -- 6.9  ALBUMIN 2.5* 2.7* 2.9*2.9*   No results found for this basename: LIPASE:3,AMYLASE:3 in the last 168 hours No results found for this basename: AMMONIA:3 in the last 168 hours   Lab 10/21/12 0500 10/20/12 0427 10/19/12 1605  WBC 9.8 13.7* 10.9*  NEUTROABS -- 12.4* 10.1*  HGB 9.9* 11.3* 12.1  HCT 33.8* 36.6 39.8  MCV 103.0* 100.0 100.8*  PLT 188 201 216    @labrcntip (inr:5)   Lab 10/20/12 0427 10/19/12 1920 10/19/12 1605  CKTOTAL -- -- 77  CKMB -- -- 5.3*  CKMBINDEX -- -- --  TROPONINI <0.30 <0.30 <0.30     Lab 10/20/12 1630 10/20/12 1129 10/20/12 0703 10/20/12 0334  10/19/12 2313  GLUCAP 132* 106* 100* 168* 127*     No results found for this basename: IRON:30,TIBC:30,TRANSFERRIN:30,FERRITIN:30 in the last 168 hours  Studies/Results: Dg Chest Port 1 View  10/20/2012  *RADIOLOGY REPORT*  Clinical Data: Ventilator.  Respiratory failure.  PORTABLE CHEST - 1 VIEW  Comparison: 10/19/2012  Findings: Interval removal of endotracheal tube.  Right central line is unchanged.  Interval placement of NG tube.  Continued diffuse interstitial prominence/disease.  Heart is borderline in size.  Left retrocardiac opacity, likely atelectasis.  No visible effusions.  IMPRESSION: Interval extubation. Otherwise no significant change.   Original Report Authenticated By: Charlett Nose, M.D.    Dg Chest Port 1 View  10/19/2012  *RADIOLOGY REPORT*  Clinical Data: Shortness of breath  PORTABLE CHEST - 1 VIEW  Comparison: 10/19/2012 1057 hours  Findings: The heart and pulmonary vascularity are stable.  Diffuse vascular congestion is again identified but mildly improved when compared with prior exam.  An endotracheal tube remains in satisfactory position.  The right jugular line is again noted.  No pneumothorax is seen.  IMPRESSION: Improved vascular congestion when compared with the prior exam although persistent interstitial changes remain.   Original Report Authenticated By: Alcide Clever, M.D.          . albuterol  2.5 mg Nebulization TID  . antiseptic oral rinse  15 mL Mouth Rinse BID  . aspirin EC  81 mg Oral Daily  . carvedilol  25 mg Oral BID WC  . fentaNYL      . ferric gluconate (FERRLECIT/NULECIT) IV  125 mg Intravenous Q Thu-HD  . heparin  5,000 Units Subcutaneous Q8H  . midazolam      . pneumococcal 23 valent vaccine  0.5 mL Intramuscular Tomorrow-1000  . rOPINIRole  1 mg Oral BID  . vancomycin  125 mg Per Tube Q6H     I  have reviewed scheduled and prn medications. (Dialysis orders outpt Dialysis at [facility]  Primary Nephrologist Caryn Section (per family)    EDW 76 kg  HD Bath 2K, 2.25 Ca  Dialyzer F160  Heparin 1600 units  Access Right upper arm AVF 15 gauge needles  No EPO or Vitamin D  100 mg/week venofer  ASSESSMENT/RECOMMENDATIONS  1. ESRD - cont TTS HD ([facility])  HD today  2. Hyperkalemia  Resolved with HD  3. VDRF  Extubated  Has "COPD"  Wonder about chronic interstitial lung ds based on appearance of most all CXR's in the system  (Dr. Philemon Kingdom = pulmonologist)  Has Home O2  4. COPD as above 5. CAD  Lat t wave changes  trops neg  6. H/O CDiff  Admit notes say "untreated"  Per daughter has been on slow taper and is on Q3D Rx  Primary has Rx 125 QID vanco  See excellent pharmacy note by Tomi Bamberger for details of CDiff treatment 7. Anemia  Hb has dropped to 9.9 Dose of Aranesp with HD today 60 mcg 7. Dispo - home soon?   Suzanne Bal, MD [facility] [phone] Pager 10/21/2012, 6:57 AM

## 2012-10-21 NOTE — Evaluation (Addendum)
Physical Therapy Evaluation Patient Details Name: [name] MRN: [id number] DOB: June 26, 1941 Today's Date: 10/21/2012 Time: 7829-5621 PT Time Calculation (min): 26 min  PT Assessment / Plan / Recommendation Clinical Impression  pt adm with SOB, went into resp failure, intubated.  Extubated 1/22.  On eval, mobility limited by weakness and decr activity tolerance.  Pt can benefit from PT to maximize function.    PT Assessment  Patient needs continued PT services    Follow Up Recommendations  SNF;Other (comment);Supervision/Assistance - 24 hour (vs HHPT)    Does the patient have the potential to tolerate intense rehabilitation      Barriers to Discharge Decreased caregiver support      Equipment Recommendations  None recommended by PT    Recommendations for Other Services     Frequency Min 3X/week    Precautions / Restrictions Precautions Precautions: Fall   Pertinent Vitals/Pain EHR in upper 80's, sats on 3L 92%      Mobility  Bed Mobility Bed Mobility: Supine to Sit;Sitting - Scoot to Edge of Bed Supine to Sit: 4: Min assist;With rails;HOB elevated Sitting - Scoot to [place] of Bed: 4: Min assist Details for Bed Mobility Assistance: vc's for direction to make it easier.  Pt not feeling well and struggling Transfers Transfers: Sit to Stand;Stand to Sit;Stand Pivot Transfers Sit to Stand: 4: Min assist;With upper extremity assist;From bed Stand to Sit: 4: Min assist;With upper extremity assist;Other (comment) (w/c) Stand Pivot Transfers: 4: Min assist Details for Transfer Assistance: vc's for hand placement and safety; minor steadying assist Ambulation/Gait Ambulation/Gait Assistance: 4: Min assist Ambulation Distance (Feet): 50 Feet Assistive device: Other (Comment) (pushing W/C) Ambulation/Gait Assistance Details: pt unsteady and impulsive in how she was moving about so assisted to help steady for safety Gait Pattern: Step-through pattern;Decreased step length -  right;Decreased step length - left;Decreased stride length Stairs: No Wheelchair Mobility Wheelchair Mobility: No    Shoulder Instructions     Exercises     PT Diagnosis: Difficulty walking;Generalized weakness;Other (comment) (decr activity tolerance)  PT Problem List: Decreased strength;Decreased activity tolerance;Decreased balance;Decreased mobility;Decreased safety awareness;Decreased knowledge of precautions;Cardiopulmonary status limiting activity PT Treatment Interventions: Gait training;Functional mobility training;Therapeutic activities;Balance training;Patient/family education   PT Goals Acute Rehab PT Goals PT Goal Formulation: With patient Time For Goal Achievement: 11/04/12 Potential to Achieve Goals: Good Pt will go Supine/Side to Sit: with modified independence;with HOB 0 degrees PT Goal: Supine/Side to Sit - Progress: Goal set today Pt will go Sit to Stand: with modified independence PT Goal: Sit to Stand - Progress: Goal set today Pt will Transfer Bed to Chair/Chair to Bed: with modified independence PT Transfer Goal: Bed to Chair/Chair to Bed - Progress: Goal set today Pt will Ambulate: >150 feet;with modified independence;with least restrictive assistive device PT Goal: Ambulate - Progress: Goal set today Pt will Go Up / Down Stairs: 1-2 stairs;with modified independence;with rail(s) PT Goal: Up/Down Stairs - Progress: Goal set today  Visit Information  Last PT Received On: 10/21/12 Assistance Needed: +1    Subjective Data  Subjective: I feel like crap, so I'm not going to walk all the way there (unit 2000) Patient Stated Goal: Get home, I   Prior Functioning  Home Living Lives With: Alone Available Help at Discharge: Family;Other (Comment) (daughter) Type of Home: House Home Access: Stairs to enter [employer] of Steps: 1/1 Home Layout: One level Bathroom Shower/Tub: Engineer, manufacturing systems: Standard Home Adaptive Equipment:  None Prior Function Level of Independence: Independent  Able to Take Stairs?: Yes Driving: Yes Communication Communication: No difficulties    Cognition  Overall Cognitive Status: Appears within functional limits for tasks assessed/performed Arousal/Alertness: Lethargic Orientation Level: Appears intact for tasks assessed Behavior During Session: [facility] for tasks performed    Extremity/Trunk Assessment Right Lower Extremity Assessment RLE ROM/Strength/Tone: [facility] for tasks assessed;Deficits RLE ROM/Strength/Tone Deficits: hip flexors 2+ to 3-/5 and painful, o/w 4- to 4/5 bil Left Lower Extremity Assessment LLE ROM/Strength/Tone: [facility] for tasks assessed;Deficits LLE ROM/Strength/Tone Deficits: see R LE Trunk Assessment Trunk Assessment: Normal   Balance Balance Balance Assessed: Yes Static Sitting Balance Static Sitting - Balance Support: Left upper extremity supported;Right upper extremity supported;Feet supported Static Sitting - Level of Assistance: Other (comment) (min guard) Dynamic Standing Balance Dynamic Standing - Balance Support: During functional activity;No upper extremity supported Dynamic Standing - Level of Assistance: 4: Min assist  End of Session PT - End of Session Activity Tolerance: Patient limited by fatigue;Patient limited by pain;Other (comment) (pt stated she felt like crap and couldn't do much) Patient left: Other (comment);with family/visitor present (left in the W/C) Nurse Communication: Mobility status  GP     Reneta Niehaus, Eliseo Gum 10/21/2012, 5:17 PM  10/21/2012  Whitwell Bing, PT [phone] (pager)

## 2012-10-21 NOTE — Progress Notes (Signed)
PULMONARY  / CRITICAL CARE MEDICINE  Name: [name] MRN: [id number] DOB: [birth year]    LOS: 2  REFERRING MD :  [facility]  CHIEF COMPLAINT:  Shortness of Breath  BRIEF PATIENT DESCRIPTION: 72 year old female with PMH ESRD on HD T,Th,Sat, COPD, CAD, and chronic Cdiff admitted from [facility] for worsening SOB on 1/21 and HD.    LINES / TUBES: ETT 1/21>>1/22 RIJ 1/21>>> RAVF Foley 1/21>>1/22 OGT 1/21>>1/22  CULTURES: 1/21 - Blood x2 1/21>>> 1/21 - Sputum 1/21>>>gram stain: gram neg and pos cocci 1/21 - Respiratory virus panel 1/21>>> 1/21 - Influenza PCR >>neg 1/21 - Urinary Ag Strep Pneumoniae and Legionella >> 1/23 - Cdiff PCR>>>  ANTIBIOTICS: Vancomycin Oral 125mg  TID 1/21 (needs 14 days)>>>  SIGNIFICANT EVENTS:  1/21: admitted to [facility] from [place] for Acute respiratory failure and HD.  HD done.  1/23: HD today, likely d/c home   LEVEL OF CARE:  ICU PRIMARY SERVICE:  PCCM CONSULTANTS:  Renal CODE STATUS: FULL DIET:  Renal diet DVT Px:  Heparin GI Px:  Protonix  HISTORY OF PRESENT ILLNESS:  [name] is a 72 year old female with PMH ESRD on HD (T,Th,Sat), HTN, paroxysmal Afib, CAD (stent), COPD, and untreated Cdiff admitted to [facility] on 10/19/2012 on transfer from [facility] for worsening SOB x2-3 days prior to admission. Her last HD session was 1/18, however, was incomplete (for unknown reasons) per daughter. During [facility] ER course, ABG confirmed significant respiratory acidosis, refractory to BiPAP. Therefore, she was intubated prior to transfer. Of note, the patient had a similar presentation in 08/2012 with missed HD session at that time.  SUBJECTIVE / INTERVAL HISTORY:  1/21- transfer to [facility], vent, HTN, HD done 1/23- HD today  VITAL SIGNS: Temp:  [97.6 F (36.4 C)-98.5 F (36.9 C)] 98.2 F (36.8 C) (01/23 0400) Pulse Rate:  [85-136] 87  (01/23 0600) Resp:  [16-25] 22  (01/23 0600) BP: (110-174)/(46-87) 160/73 mmHg (01/23 0600) SpO2:   [91 %-100 %] 100 % (01/23 0600) HEMODYNAMICS:   VENTILATOR SETTINGS:   INTAKE / OUTPUT: Intake/Output      01/22 0701 - 01/23 0700 01/23 0701 - 01/24 0700   P.O. 840    I.V. (mL/kg) 120 (1.5)    NG/GT     IV Piggyback     Total Intake(mL/kg) 960 (12.1)    Urine (mL/kg/hr)     Other     Total Output     Net +960         Urine Occurrence 5 x    Emesis Occurrence 1 x      PHYSICAL EXAMINATION: General: Vital signs reviewed and noted. Obese, NAD  Head: Normocephalic, atraumatic.  Lungs:  Coarse b/l  Heart: RRR. S1 and S2 normal without gallop, murmur, or rubs.  Abdomen:  BS normoactive. Soft, obese, Nondistended  Extremities: No pretibial edema, +2dp b/l, RAVF  Skin: Chronic venous stasis b/l lower extremities  Neuro: AAOX3    IMAGING: PCXR 1/21: improved vascular congestion, persistent interstitial changes  ECG: 97 NSR, t wave inversions lateral leads  MEDICATIONS: Infusions   Scheduled    . albuterol  2.5 mg Nebulization TID  . antiseptic oral rinse  15 mL Mouth Rinse BID  . aspirin EC  81 mg Oral Daily  . carvedilol  25 mg Oral BID WC  . darbepoetin (ARANESP) injection - DIALYSIS  60 mcg Intravenous Q Thu-HD  . fentaNYL      . ferric gluconate (FERRLECIT/NULECIT) IV  125 mg Intravenous Q Thu-HD  . heparin  5,000 Units Subcutaneous Q8H  . midazolam      . pneumococcal 23 valent vaccine  0.5 mL Intramuscular Tomorrow-1000  . rOPINIRole  1 mg Oral BID  . vancomycin  125 mg Per Tube Q6H   DIAGNOSES: Principal Problem:  *Acute-on-chronic respiratory failure Active Problems:  End stage renal disease  C. difficile colitis  DM2 (diabetes mellitus, type 2)  Anemia  COPD (chronic obstructive pulmonary disease)  Acute encephalopathy  ASSESSMENT / PLAN:  PULMONARY  Lab 10/20/12 0141 10/19/12 1623  PHART 7.493* 7.404  PCO2ART 40.1 41.3  PO2ART 134.0* 123.0*  HCO3 30.5* 25.8*  TCO2 31.7 27   A:  VDRF ETT 1/21 P:   Oxygen therapy [place] PRN Keep o2 sat  <92% See ID  CARDIOVASCULAR  Lab 10/20/12 0427 10/19/12 1920 10/19/12 1605  CKTOTAL -- -- 77  CKMB -- -- 5.3*  TROPONINI <0.30 <0.30 <0.30    Lab 10/19/12 1605  PROBNP 17356.0*   A:  1) T wave inversions on EKG--lateral leads--trop x3 neg 2) CHF--08/2012 echo: mild LVH, normal systolic function, EF 55-60%  3) HTN - elevated BPs likely 2/2 volume overload in setting of missed HD. On Coreg 25mg  BID at home. P:  Lactic acid 1.6 Cardiology consulted--Dr. Novella Rob evidence of acute ischemic event at this time F/u Echo--severe concentric hypertrophy with increased apical thickness.  EF 65-70%.  PA pressure ASA Continue BB PRN metoprolol  RENAL  Lab 10/21/12 0500 10/20/12 0427 10/20/12 0100  NA 140 140 138  K 4.5 4.1 3.6  CL 95* 97 96  CO2 30 32 30  BUN 51* 31* 26*  CREATININE 6.52* 4.75* 4.19*  GLUCOSE 92 148* 198*   A: ESRD on HD T,Th,Sat.  Hx of missing HD sessions. HD done 1/21 2) Anion gap metabolic acidosis--resolved P:  AG 20-->11 Renal following--HD today Correct electrolytes Restart Phoslyra  GASTROINTESTINAL  Lab 10/21/12 0500 10/20/12 0427 10/19/12 1605  AST -- -- 16  ALT -- -- 10  ALKPHOS -- -- 86  BILITOT -- -- 0.2*  PROT -- -- 6.9  ALBUMIN 2.5* 2.7* 2.9*2.9*   A: Pseudomembranous colitis--untreated P:  Per pharmacy records, has been receiving treatment since April 2013 Tolerating renal diet See ID  HEMATOLOGIC  Lab 10/21/12 0500 10/20/12 0427 10/19/12 1605  WBC 9.8 13.7* 10.9*  HGB 9.9* 11.3* 12.1  HCT 33.8* 36.6 39.8  MCV 103.0* 100.0 100.8*  PLT 188 201 216  APTT -- -- --  INR -- -- --   A: Chronic anemia--baseline Hb~10-11 P:  Trend CBC Aranesp with HD today  INFECTIOUS  Lab 10/21/12 0500 10/20/12 0427 10/19/12 1605  WBC 9.8 13.7* 10.9*  NEUTROABS -- 12.4* 10.1*  LATICACIDVEN -- -- 1.6  PROCALCITON -- -- --   A: 1) Pseudomembranous colitis  P:  F/u Respiratory virus panel Urinary Ag--Strep Pneumoniae and  Legionella neg F/u respiratory cx--abundant gram neg cocci and rare gram positive coci and blood cx x2--NGTD PO Vanc for 14 days, started 1/21 plan a 14 day course.  ENDOCRINE / RHEUMATOLOGIC  Lab 10/20/12 1630 10/20/12 1129 10/20/12 0703 10/20/12 0334 10/19/12 2313  GLUCAP 132* 106* 100* 168* 127*   A: DM2 P:  Phase 1 ICU hyperglycemia protocol  NEUROLOGIC / PSYCHIATRIC A: Acute encephalopathy - secondary to respiratory and likely metabolic acidosis. P:  RASS 0 Fentanyl PRN  GLOBAL: Family updated by bedside  CLINICAL SUMMARY: 72 year old female with PMH ESRD on HD  T,Th,Sat, COPD, CAD, and chronic Cdiff admitted from [place] for acute respiratory failure on 1/21 and HD. Likely big issue HTN crisis.  ETT 1/21. HD done 1/21.    Signed: Darden Palmer, MD PGY-I, Internal Medicine Resident Pager: [phone] (7PM-7AM) 10/21/2012,7:24 AM  Will transfer to tele after dialysis, patient stable, transfer care to [facility], PCCM will sign off, please call back if needed.  Patient seen and examined, agree with above note.  I dictated the care and orders written for this patient under my direction.  Alyson Reedy, MD [phone]

## 2012-10-21 NOTE — Clinical Documentation Improvement (Signed)
GENERIC DOCUMENTATION CLARIFICATION QUERY  THIS DOCUMENT IS NOT A PERMANENT PART OF THE MEDICAL RECORD    Please update your documentation within the medical record to reflect your response to this query.                                                                                         10/21/12   Dear Dr. Virgina Organ and[facility]or Associates,  In a better effort to capture your patient's severity of illness, reflect appropriate length of stay and utilization of resources, a review of the patient medical record has revealed the following indicators:  "Acute-on-chronic respiratory failure--with hx of COPD. VDRF with ETT 10/19/12. CXR with edema. Emergent HD on 10/19/12. Similar admission in 08/2012. ProBNP on admission 17356 with repeat echo during hospital course with EF 65-70% and severe concentric hypertrophy with increased apical thickness. Extubated 09/19/13. Improved during hospital course. Follow up with pcp and consider pulmonologist follow up for possible high resolution CT chest"  Darden Palmer  10/21/2012, 9:41 AM DC Summary    Based on your clinical judgment, please document in the progress notes and discharge summary if a condition below provides greater specificity regarding the patient's "CXR with edema".   - Acute Pulmonary Edema   - Other Condition   - Unable to clinically Determine   In responding to this query please exercise your independent judgment.    The fact that a query is asked, does not imply that any particular answer is desired or expected.   Reviewed: additional documentation in the medical record  Thank You,  Jerral Ralph  RN BSN CCDS Certified Clinical Documentation Specialist: Cell   [phone]  Health Information Management [place]

## 2012-10-21 NOTE — Progress Notes (Signed)
CRITICAL CARE RESIDENT NOTE Interim Progress Note  The patient will be transferred to the med-surg unit today, primary service will be transferred to [employer] I spoke with Dr. Butler Denmark, who agrees to accept transfer of service, to be resumed tomorrow. PCCM will not be continuing to follow in consult, unless reconsulted.  Signed: Darden Palmer, MD PGY-I, Internal Medicine Resident Pager: [phone] (7PM-7AM) 10/21/2012,2:53 PM

## 2012-10-21 NOTE — Progress Notes (Signed)
Received from 2100. Assessed per flow sheet. Denies chest pain or shortness of breath. Patient states she is tired from dialysis. VSS. Call bell and family near.[name]

## 2012-10-21 NOTE — Discharge Summary (Signed)
Internal Medicine Teaching [facility] Discharge Note  Name: [name] MRN: [id number] DOB: 03-21-1941 72 y.o.  Date of Admission: 10/19/2012  2:22 PM Date of Discharge: 10/21/2012 Attending Physician: Nelda Bucks, MD  Discharge Diagnosis: Principal Problem:  *Acute-on-chronic respiratory failure Active Problems:  End stage renal disease  C. difficile colitis  DM2 (diabetes mellitus, type 2)  Anemia  Acute encephalopathy  Discharge Medications:   Medication List     As of 10/21/2012 11:14 AM    STOP taking these medications         vancomycin 50 mg/mL oral solution   Commonly known as: VANCOCIN      TAKE these medications         albuterol (2.5 MG/3ML) 0.083% nebulizer solution   Commonly known as: PROVENTIL   Take 2.5 mg by nebulization every 6 (six) hours as needed. For shortness of breath      calcium acetate (Phos Binder) 667 MG/5ML Soln   Commonly known as: PHOSLYRA   Take 1,334 mg by mouth 4 (four) times daily.      carvedilol 25 MG tablet   Commonly known as: COREG   Take 25 mg by mouth 2 (two) times daily with a meal.      darbepoetin 60 MCG/0.3ML Soln   Commonly known as: ARANESP   Inject 0.3 mLs (60 mcg total) into the vein every Thursday with hemodialysis.      ezetimibe 10 MG tablet   Commonly known as: ZETIA   Take 10 mg by mouth daily.      fluticasone 110 MCG/ACT inhaler   Commonly known as: FLOVENT HFA   Inhale 1 puff into the lungs 2 (two) times daily.      rOPINIRole 1 MG tablet   Commonly known as: REQUIP   Take 1 mg by mouth 2 (two) times daily.      sodium chloride 0.9 % SOLN 100 mL with ferric gluconate 12.5 MG/ML SOLN 125 mg   Inject 125 mg into the vein every Thursday with hemodialysis.        Disposition and follow-up:   SuzanneSuzanne Cook was discharged from [facility] in stable condition.  At the hospital follow up visit please address:  1) HD--T,Th,Sat 2) Respiratory status--consider high  resolution CT chest for possible interstitial lung disease 2/2 recurrent episodes of respiratory failure and exacerbations 3) Cdiff--on chronic treatment since April 2013.  Currently no BM during this hospitalization.  Stopped oral vancomycin, consider rechecking for Cdiff and resume treatment as needed.    Follow-up Appointments:  Discharge Orders    Future Appointments: Provider: Department: Dept Phone: Center:   08/30/2013 2:30 PM [facility] [phone] [facility]   08/30/2013 3:40 PM Evern Bio, NP [facility] [phone] [facility]     Consultations: Treatment Team:  Sadie Haber, MD  Procedures Performed:  Dg Chest Port 1 View  10/21/2012  *RADIOLOGY REPORT*  Clinical Data: Respiratory failure.  PORTABLE CHEST - 1 VIEW  Comparison: 10/20/2012  Findings: Endotracheal and nasogastric tubes been removed.  A right jugular central line shows stable positioning.  Lungs show stable interstitial prominence without overt airspace edema or pleural effusions.  Heart size is stable.  IMPRESSION: Stable interstitial lung disease.   Original Report Authenticated By: Irish Lack, M.D.    Dg Chest Port 1 View  10/20/2012  *RADIOLOGY REPORT*  Clinical Data: Ventilator.  Respiratory failure.  PORTABLE CHEST - 1  VIEW  Comparison: 10/19/2012  Findings: Interval removal of endotracheal tube.  Right central line is unchanged.  Interval placement of NG tube.  Continued diffuse interstitial prominence/disease.  Heart is borderline in size.  Left retrocardiac opacity, likely atelectasis.  No visible effusions.  IMPRESSION: Interval extubation. Otherwise no significant change.   Original Report Authenticated By: Charlett Nose, M.D.    Dg Chest Port 1 View  10/19/2012  *RADIOLOGY REPORT*  Clinical Data: Shortness of breath  PORTABLE CHEST - 1 VIEW  Comparison: 10/19/2012 1057 hours  Findings: The heart and pulmonary vascularity are stable.   Diffuse vascular congestion is again identified but mildly improved when compared with prior exam.  An endotracheal tube remains in satisfactory position.  The right jugular line is again noted.  No pneumothorax is seen.  IMPRESSION: Improved vascular congestion when compared with the prior exam although persistent interstitial changes remain.   Original Report Authenticated By: Alcide Clever, M.D.    Dg Chest Port 1 View  09/23/2012  *RADIOLOGY REPORT*  Clinical Data: Intubated.  PORTABLE CHEST - 1 VIEW  Comparison: 09/22/2012.  Findings: Endotracheal tube tip 4.2 cm above the carina.  Left sided split type catheter in place with the tips in the region of the distal superior vena cava/cavoatrial junction.  No gross pneumothorax.  There is a catheter projecting over the right supraclavicular region.  Etiology indeterminate.  This appears different than the prior right central line (which was directed in a ceaphalad direction).  Clinical correlation recommended to determine if this is related to structure other than a central line (such as related to intubation).  Cardiomegaly.  Asymmetric pulmonary edema greater on the left. Interstitial infiltrate not excluded.  IMPRESSION: There is a catheter projecting over the right supraclavicular region.  Etiology indeterminate.  This appears different than the prior right central line (which was directed in a ceaphalad direction).  Clinical correlation recommended to determine if this is related to structure other than a central line (such as related to intubation).  Cardiomegaly.  Asymmetric pulmonary edema greater on the left.  Appearance without significant change.   Original Report Authenticated By: Lacy Duverney, M.D.    Dg Chest Portable 1 View  09/22/2012  *RADIOLOGY REPORT*  Clinical Data: Central venous catheter placement.  PORTABLE CHEST - 1 VIEW 09/22/2012 2105 hours:  Comparison: Portable chest x-ray earlier same date 1856 hours.  Findings: Right subclavian  central venous catheter extends upward into the right internal jugular vein.  The tip is not included on the image.  Endotracheal tube tip remains in satisfactory position projecting approximately 5-6 cm above the carina.  Left jugular dialysis catheter tips project over the mid and lower SVC, unchanged.  Diffuse interstitial and airspace pulmonary edema is unchanged since examination earlier in the day, as are the small bilateral pleural effusions.  No new pulmonary parenchymal abnormalities.  IMPRESSION:  1.  Right subclavian central venous catheter extends upward into the right internal jugular vein.  Its tip was not included on the image. 2.  Remaining support apparatus satisfactory. 3.  No change in the CHF and/or fluid overload and small bilateral pleural effusions since the examination earlier today.   Original Report Authenticated By: Hulan Saas, M.D.    Dg Chest Port 1 View  09/22/2012  *RADIOLOGY REPORT*  Clinical Data: Endotracheal tube placement, altered mental status  PORTABLE CHEST - 1 VIEW  Comparison: 08/19/2012; 07/28/2012  Findings:  Grossly unchanged cardiac silhouette and mediastinal contours. Interval intubation with endotracheal tube overlying tracheal  air column with tip superior to the carina.  Stable positioning of left jugular approach central venous catheter with tips overlying the superior aspect the right atrium.  Pulmonary vasculature is indistinct with cephalization of flow. Worsening perihilar heterogeneous opacities.  No definite pleural effusion or pneumothorax.  Unchanged bones.  IMPRESSION: 1.  Endotracheal tube overlies tracheal air column with tip superior to the carina.  No pneumothorax. 2.  Findings most suggestive of pulmonary edema.   Original Report Authenticated By: Tacey Ruiz, MD    2D Echo:  [facility]* *[facility]* [address] [place], [place]  [address] [phone]  ------------------------------------------------------------ Transthoracic Echocardiography  Patient: [name], [name] MR #: [id number] Study Date: 10/19/2012 Gender: F Age: 17 Height: 152.4cm Weight: 81.1kg BSA: 1.75m^2 Pt. Status: Room: 2101  PERFORMING [facility] ADMITTING Merwyn Katos SONOGRAPHER Silvano Bilis, RCS ATTENDING Glyn Ade, Daniel cc:  ------------------------------------------------------------ LV EF: 65% - 70%  ------------------------------------------------------------ Indications: Abnormal EKG 794.31. Respiratory Failure acute 518.81.  ------------------------------------------------------------ History: PMH: Chronic obstructive pulmonary disease. Risk factors: Former tobacco use. Diabetes mellitus.  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 Systolic function was vigorous. The estimated ejection fraction was in the range of 65% to 70%. There is apical to mid-ventricle systolic cavity obliteration noted. Images were inadequate for LV wall motion assessment. LV diastolic function cannot be assessed due to EA fusion from tachycardia. - Aortic valve: Sclerosis without stenosis. No regurgitation. - Mitral valve: Moderately calcified annulus. Mild regurgitation. - Left atrium: The atrium was normal in size. - Atrial septum: No defect or patent foramen ovale was identified. - Pulmonary arteries: PA peak pressure: At least 37mm Hg (S). - Inferior vena cava: The vessel was dilated; the respirophasic diameter changes were blunted (< 50%); findings are consistent with elevated central venous pressure - however, this may in part be due to positive pressure ventilation. Transthoracic echocardiography. M-mode, complete  2D, spectral Doppler, and color Doppler. Height: Height: 152.4cm. Height: 60in. Weight: Weight: 81.1kg. Weight: 178.4lb. Body mass index: BMI: 34.9kg/m^2. Body surface area: BSA: 1.57m^2. Blood pressure: 177/79. Patient status: Inpatient. Location: ICU/CCU  ------------------------------------------------------------  ------------------------------------------------------------ Left ventricle: Small LV cavity. 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 Systolic function was vigorous. The estimated ejection fraction was in the range of 65% to 70%. There is apical to mid-ventricle systolic cavity obliteration noted. Images were inadequate for LV wall motion assessment. LV diastolic function cannot be assessed due to EA fusion from tachycardia.  ------------------------------------------------------------ Aortic valve: Sclerosis without stenosis. Doppler: No regurgitation.  ------------------------------------------------------------ Aorta: Aortic root: The aortic root was normal in size. Ascending aorta: The ascending aorta was normal in size.  ------------------------------------------------------------ Mitral valve: Moderately calcified annulus. Doppler: Mild regurgitation. Mean gradient: 5mm Hg (D). Peak gradient: 11mm Hg (D).  ------------------------------------------------------------ Left atrium: The atrium was normal in size.  ------------------------------------------------------------ Atrial septum: No defect or patent foramen ovale was identified.  ------------------------------------------------------------ Right ventricle: The cavity size was normal. Wall thickness was normal. Systolic function was normal.  ------------------------------------------------------------ Pulmonic valve: Poorly visualized. Doppler: No significant  regurgitation.  ------------------------------------------------------------ Pulmonary artery: Poorly visualized.  ------------------------------------------------------------ Right atrium: The atrium was normal in size.  ------------------------------------------------------------ Pericardium: There was no pericardial effusion.  ------------------------------------------------------------ Systemic veins: Inferior vena cava: The vessel was dilated; the respirophasic diameter changes were blunted (< 50%); findings are consistent with elevated central venous pressure - however, this may  in part be due to positive pressure ventilation.  ------------------------------------------------------------  2D measurements Normal Doppler measurements Normal Left ventricle Main pulmonary LVID ED, 30.4 mm 43-52 artery chord, Pressure, S 37 mm =30 PLAX Hg LVID ES, 22.6 mm 23-38 Mitral valve chord, Mean vel, D 109 cm/s ------ PLAX Mean 5 mm ------ FS, chord, 26 % >29 gradient, D Hg PLAX Peak 11 mm ------ LVPW, ED 16.7 mm ------ gradient, D Hg IVS/LVPW 0.87 <1.3 Annulus VTI 27.8 cm ------ ratio, ED Tricuspid valve Ventricular septum Regurg peak 206 cm/s ------ IVS, ED 14.6 mm ------ vel Aortic valve Peak RV-RA 17 mm ------ Leaflet 18 mm 15-26 gradient, S Hg separation Systemic veins Aorta Estimated 20 mm ------ Root diam, 26 mm ------ CVP Hg ED Right ventricle Left atrium Pressure, S 37 mm <30 AP dim 24 mm ------ Hg AP dim 1.27 cm/m^2 <2.2 Sa vel, lat 12.3 cm/s ------ index ann, tiss DP  M-mode measurements Normal Aortic valve Leaflet 18 mm 15-26 separation  ------------------------------------------------------------ Prepared and Electronically Authenticated by  Zoila Shutter 2014-01-22T11:[id number]:[id number]  Admission HPI: [name] is a 72 year old female with PMH ESRD on HD (T,Th,Sat), HTN, paroxysmal Afib, CAD (stent), COPD, and untreated Cdiff admitted to [facility] on 10/19/2012 on  transfer from [facility] for worsening SOB x2-3 days prior to admission. Her last HD session was 1/18, however, was incomplete (for unknown reasons) per daughter. During [facility] ER course, ABG confirmed significant respiratory acidosis, refractory to BiPAP. Therefore, she was intubated prior to transfer. Of note, the patient had a similar presentation in 08/2012 with missed HD session at that time.  Hospital Course by problem list:  Acute-on-chronic respiratory failure--with hx of COPD.  VDRF with ETT 10/19/12.  CXR with vascular congestion.  Emergent HD on 10/19/12.  Similar admission in 08/2012.  ProBNP on admission 17356 with repeat echo during hospital course with EF 65-70% and severe concentric hypertrophy with increased apical thickness.   Extubated 09/19/13.  Improved during hospital course.  Follow up with pcp and consider pulmonologist follow up for possible interstitial lung disease.  Consider possible high resolution CT chest if needed--chronic interstitial changes on cxr's.     End stage renal disease--on HD T, Th, Sat.  Last HD session 10/16/12 which she claims to have terminated approximately 20-30 minutes early.  Renal consulted and she received emergent dialysis 10/19/12 and resume HD on schedule 10/21/12.  Will continue to follow with renal and pcp as outpatient.     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  Family claims she keeps testing positive which is why she continues to receive treatment.  No BM this admission.  Stopped oral vancomycin, awaiting stool sample to send for cdiff and resume treatment as needed.  If no BM during hospital stay, will need follow up with PCP and recommend testing for Cdiff and treat as needed.  She apparently has most recently completed a 14 day course of oral vancomycin 125mg  TID.     DM2 (diabetes mellitus, type 2)--per PMH.  On no medications at home.  No Hb A1C on chart review.  CBGs <200 during hospital  course.  On phase 1 ICU hyperglycemia protocol and stable.  Follow up with pcp.     Anemia--Hb stable during hospital course.  Receives Aranesp as needed with HD.  Continue to monitor and follow up with pcp and renal.     Acute encephalopathy--likely secondary to respiratory and metabolic acidosis.  Resolved during hospital course.  Off all sedation.  Back to baseline per family.  Continue to monitor.     Discharge Vitals:  BP 181/74  Pulse 91  Temp 98.7 F (37.1 C) (Oral)  Resp 18  Ht 5' (1.524 m)  Wt 169 lb 8.5 oz (76.9 kg)  BMI 33.11 kg/m2  SpO2 98%  Discharge Labs:  Results for orders placed during the hospital encounter of 10/19/12 (from the past 24 hour(s))  GLUCOSE, CAPILLARY     Status: Abnormal   Collection Time   10/20/12 11:29 AM      Component Value Range   Glucose-Capillary 106 (*) 70 - 99 mg/dL  GLUCOSE, CAPILLARY     Status: Abnormal   Collection Time   10/20/12  4:30 PM      Component Value Range   Glucose-Capillary 132 (*) 70 - 99 mg/dL  MAGNESIUM     Status: Normal   Collection Time   10/21/12  5:00 AM      Component Value Range   Magnesium 2.4  1.5 - 2.5 mg/dL  CBC     Status: Abnormal   Collection Time   10/21/12  5:00 AM      Component Value Range   WBC 9.8  4.0 - 10.5 K/uL   RBC 3.28 (*) 3.87 - 5.11 MIL/uL   Hemoglobin 9.9 (*) 12.0 - 15.0 g/dL   HCT 46.9 (*) 62.9 - 52.8 %   MCV 103.0 (*) 78.0 - 100.0 fL   MCH 30.2  26.0 - 34.0 pg   MCHC 29.3 (*) 30.0 - 36.0 g/dL   RDW 41.3 (*) 24.4 - 01.0 %   Platelets 188  150 - 400 K/uL  RENAL FUNCTION PANEL     Status: Abnormal   Collection Time   10/21/12  5:00 AM      Component Value Range   Sodium 140  135 - 145 mEq/L   Potassium 4.5  3.5 - 5.1 mEq/L   Chloride 95 (*) 96 - 112 mEq/L   CO2 30  19 - 32 mEq/L   Glucose, Bld 92  70 - 99 mg/dL   BUN 51 (*) 6 - 23 mg/dL   Creatinine, Ser 2.72 (*) 0.50 - 1.10 mg/dL   Calcium 8.5  8.4 - 53.6 mg/dL   Phosphorus 8.6 (*) 2.3 - 4.6 mg/dL   Albumin 2.5 (*) 3.5 -  5.2 g/dL   GFR calc non Af Amer 6 (*) >90 mL/min   GFR calc Af Amer 7 (*) >90 mL/min  GLUCOSE, CAPILLARY     Status: Normal   Collection Time   10/21/12  7:41 AM      Component Value Range   Glucose-Capillary 93  70 - 99 mg/dL   Signed: Darden Palmer 10/21/2012, 11:14 AM   Time Spent on Discharge: 45 minutes Services Ordered on Discharge: Continue HD Equipment Ordered on Discharge: N/A

## 2012-10-22 MED ORDER — CALCIUM ACETATE (PHOS BINDER) 667 MG/5ML PO SOLN
1334.0000 mg | Freq: Three times a day (TID) | ORAL | Status: DC
  Administered 2012-10-22 – 2012-10-23 (×5): 1334 mg via ORAL
  Filled 2012-10-22 (×8): qty 10

## 2012-10-22 MED ORDER — AZITHROMYCIN 500 MG PO TABS
500.0000 mg | ORAL_TABLET | Freq: Every day | ORAL | Status: DC
  Administered 2012-10-22 – 2012-10-23 (×2): 500 mg via ORAL
  Filled 2012-10-22 (×2): qty 1

## 2012-10-22 MED ORDER — SACCHAROMYCES BOULARDII 250 MG PO CAPS
250.0000 mg | ORAL_CAPSULE | Freq: Two times a day (BID) | ORAL | Status: DC
  Administered 2012-10-22 – 2012-10-23 (×2): 250 mg via ORAL
  Filled 2012-10-22 (×3): qty 1

## 2012-10-22 MED ORDER — SODIUM CHLORIDE 0.9 % IJ SOLN
10.0000 mL | INTRAMUSCULAR | Status: DC | PRN
  Administered 2012-10-22 (×2): 30 mL

## 2012-10-22 NOTE — Progress Notes (Addendum)
[facility] PROGRESS NOTE  ANTHONY ROLAND YNW:[id number] DOB: [birth year] DOA: 10/19/2012 PCP: [name], MD  Assessment/Plan: Acute ventilatory-on-chronic respiratory failure with respiratory culture positive for Moraxella catarrhalis. Patient has a history of COPD. ETT 10/19/12 extubated on 10/20/2012. CXR with vascular congestion. Emergent HD on 10/19/12. Similar admission in 08/2012. ProBNP in the setting of end-stage renal disease on admission 17356 with repeat echo during hospital course with EF 65-70% and severe concentric hypertrophy with increased apical thickness.  Improved during hospital course.  Patient still coughing.  Respiratory culture grew abundant Moraxella catarrhalis, discussed with Dr. Ninetta Lights, infectious diseases recommended treating for 5 days.  Patient may benefit from outpatient pulmonary evaluation and imaging for possible interstitial lung disease.  End stage renal disease On HD T, Th, Sat. Last HD session 10/16/12 which she claims to have terminated approximately 20-30 minutes early. Renal consulted and she received emergent dialysis 10/19/12 and resume HD on schedule 10/21/12.  Further management as per renal.   C. 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  Initially did not have any bowel movement since admission, oral vancomycin was discontinued.  Check stool for C. difficile.  Had 3 bowel movements today which were formed and not watery.  Start empiric fluorastar.  DM2 (diabetes mellitus, type 2) Not medications at home.  Check hemoglobin A1c.  CBC stable.    Anemia Likely due to end-stage renal disease.  Hemoglobin stable. Receives Aranesp as needed with HD.  Management as per renal.    Acute encephalopathy/acute delirium/altered mental status Likely secondary to respiratory and metabolic acidosis. Resolved during hospital course. Off all sedation. Back to baseline per family. Continue to monitor.   Hypertension Stable.  Continue  current medications.  Continue to monitor.  Non specific lateral TW changes Evaluated by cardiology.  Thought to be due to supply demand. Cardiac enzymes negative.  Recommended resuming metoprolol and blood pressure control.   Generalized weakness Physical therapy recommending skilled nursing facility however patient wants to go home, may need to arrange home health PT, at discharge.  Code Status: Presumed full code. Family Communication: No family at bedside.  Disposition Plan: Pending. PT recommending SNF, patient wants to go home with home health PT.   Consultants:  PCCM, signed off.  Renal, Dr. Eliott Nine  Procedures:  As above.  Antibiotics:  Oral vancomycin 10/19/2012 >> 10/21/2012  Azithromycin 10/22/2012 >> (5 days total).  HPI/Subjective: Coughing with cough productive mucous.   Objective: Filed Vitals:   10/22/12 0514 10/22/12 0804 10/22/12 0855 10/22/12 1420  BP: 124/61  144/63 89/57  Pulse: 87  88 75  Temp: 98.4 F (36.9 C)   97.5 F (36.4 C)  TempSrc: Oral   Oral  Resp: 18   18  Height:      Weight: 76.068 kg (167 lb 11.2 oz)     SpO2: 93% 95%  93%    Intake/Output Summary (Last 24 hours) at 10/22/12 1652 Last data filed at 10/22/12 1230  Gross per 24 hour  Intake    600 ml  Output     75 ml  Net    525 ml   Filed Weights   10/21/12 0945 10/21/12 1410 10/22/12 0514  Weight: 79.6 kg (175 lb 7.8 oz) 76.5 kg (168 lb 10.4 oz) 76.068 kg (167 lb 11.2 oz)    Exam: Physical Exam: General: Awake, Oriented, No acute distress. HEENT: EOMI. Neck: Supple CV: S1 and S2 Lungs: Coarse breath sounds at bases bilaterally. Abdomen: Soft, Nontender, Nondistended, +bowel  sounds. Ext: Good pulses. Trace edema.  Data Reviewed: Basic Metabolic Panel:  Lab 10/21/12 1610 10/20/12 0427 10/20/12 0100 10/19/12 1605  NA 140 140 138 138  K 4.5 4.1 3.6 6.4*  CL 95* 97 96 94*  CO2 30 32 30 24  GLUCOSE 92 148* 198* 186*  BUN 51* 31* 26* 83*  CREATININE 6.52* 4.75*  4.19* 9.29*9.13*  CALCIUM 8.5 9.0 8.7 9.1  MG 2.4 2.2 -- --  PHOS 8.6* 4.0 -- 6.4*   Liver Function Tests:  Lab 10/21/12 0500 10/20/12 0427 10/19/12 1605  AST -- -- 16  ALT -- -- 10  ALKPHOS -- -- 86  BILITOT -- -- 0.2*  PROT -- -- 6.9  ALBUMIN 2.5* 2.7* 2.9*2.9*   No results found for this basename: LIPASE:5,AMYLASE:5 in the last 168 hours No results found for this basename: AMMONIA:5 in the last 168 hours CBC:  Lab 10/21/12 0500 10/20/12 0427 10/19/12 1605  WBC 9.8 13.7* 10.9*  NEUTROABS -- 12.4* 10.1*  HGB 9.9* 11.3* 12.1  HCT 33.8* 36.6 39.8  MCV 103.0* 100.0 100.8*  PLT 188 201 216   Cardiac Enzymes:  Lab 10/20/12 0427 10/19/12 1920 10/19/12 1605  CKTOTAL -- -- 77  CKMB -- -- 5.3*  CKMBINDEX -- -- --  TROPONINI <0.30 <0.30 <0.30   BNP (last 3 results)  Basename 10/19/12 1605 08/19/12 2020  PROBNP 17356.0* 10460.0*   CBG:  Lab 10/21/12 1533 10/21/12 1332 10/21/12 0741 10/20/12 1630 10/20/12 1129  GLUCAP 91 81 93 132* 106*    Recent Results (from the past 240 hour(s))  RESPIRATORY VIRUS PANEL     Status: Normal   Collection Time   10/19/12  4:05 PM      Component Value Range Status Comment   Source - RVPAN NASOPHARYNGEAL   Final    Respiratory Syncytial Virus A NOT DETECTED   Final    Respiratory Syncytial Virus B NOT DETECTED   Final    Influenza A NOT DETECTED   Final    Influenza B NOT DETECTED   Final    Parainfluenza 1 NOT DETECTED   Final    Parainfluenza 2 NOT DETECTED   Final    Parainfluenza 3 NOT DETECTED   Final    Metapneumovirus NOT DETECTED   Final    Rhinovirus NOT DETECTED   Final    Adenovirus NOT DETECTED   Final    Influenza A H1 NOT DETECTED   Final    Influenza A H3 NOT DETECTED   Final   MRSA PCR SCREENING     Status: Normal   Collection Time   10/19/12  4:05 PM      Component Value Range Status Comment   MRSA by PCR NEGATIVE  NEGATIVE Final   CULTURE, BLOOD (ROUTINE X 2)     Status: Normal (Preliminary result)    Collection Time   10/19/12  4:09 PM      Component Value Range Status Comment   Specimen Description BLOOD LEFT HAND   Final    Special Requests BOTTLES DRAWN AEROBIC ONLY 3CC   Final    Culture  Setup Time 10/20/2012 00:23   Final    Culture     Final    Value:        BLOOD CULTURE RECEIVED NO GROWTH TO DATE CULTURE WILL BE HELD FOR 5 DAYS BEFORE ISSUING A FINAL NEGATIVE REPORT   Report Status PENDING   Incomplete   CULTURE, BLOOD (ROUTINE X 2)  Status: Normal (Preliminary result)   Collection Time   10/19/12  4:20 PM      Component Value Range Status Comment   Specimen Description BLOOD CENTRAL LINE   Final    Special Requests BOTTLES DRAWN AEROBIC ONLY [facility]   Final    Culture  Setup Time 10/20/2012 00:23   Final    Culture     Final    Value:        BLOOD CULTURE RECEIVED NO GROWTH TO DATE CULTURE WILL BE HELD FOR 5 DAYS BEFORE ISSUING A FINAL NEGATIVE REPORT   Report Status PENDING   Incomplete   CULTURE, RESPIRATORY     Status: Normal   Collection Time   10/19/12  6:38 PM      Component Value Range Status Comment   Specimen Description TRACHEAL ASPIRATE   Final    Special Requests NONE   Final    Gram Stain     Final    Value: ABUNDANT WBC PRESENT,BOTH PMN AND MONONUCLEAR     FEW SQUAMOUS EPITHELIAL CELLS PRESENT     ABUNDANT GRAM NEGATIVE COCCI     RARE GRAM POSITIVE COCCI     IN PAIRS   Culture     Final    Value: ABUNDANT MORAXELLA CATARRHALIS(BRANHAMELLA)     Note: BETA LACTAMASE POSITIVE   Report Status 10/21/2012 FINAL   Final      Studies: Dg Chest Port 1 View  10/21/2012  *RADIOLOGY REPORT*  Clinical Data: Respiratory failure.  PORTABLE CHEST - 1 VIEW  Comparison: 10/20/2012  Findings: Endotracheal and nasogastric tubes been removed.  A right jugular central line shows stable positioning.  Lungs show stable interstitial prominence without overt airspace edema or pleural effusions.  Heart size is stable.  IMPRESSION: Stable interstitial lung disease.   Original  Report Authenticated By: Irish Lack, M.D.     Scheduled Meds:   . albuterol  2.5 mg Nebulization TID  . antiseptic oral rinse  15 mL Mouth Rinse BID  . aspirin EC  81 mg Oral Daily  . calcium acetate (Phos Binder)  1,334 mg Oral TID WC & HS  . carvedilol  25 mg Oral BID WC  . darbepoetin (ARANESP) injection - DIALYSIS  60 mcg Intravenous Q Thu-HD  . ferric gluconate (FERRLECIT/NULECIT) IV  125 mg Intravenous Q Thu-HD  . heparin  5,000 Units Subcutaneous Q8H  . rOPINIRole  1 mg Oral BID   Continuous Infusions:   Principal Problem:  *Acute-on-chronic respiratory failure Active Problems:  End stage renal disease  DM2 (diabetes mellitus, type 2)  Anemia  Acute encephalopathy  C. difficile colitis    Lyris Hitchman A, MD  [facility] Pager [phone]. If 7PM-7AM, please contact night-coverage at [email or web address], password [facility] 10/22/2012, 4:52 PM  LOS: 3 days

## 2012-10-22 NOTE — Progress Notes (Signed)
Dr. Betti Cruz to see patient. Asked about obtaining peripheral line and d/c central; verbal order given to continue central line. [name]

## 2012-10-22 NOTE — Progress Notes (Signed)
Subjective: feels good Dialyzed yesterday Weight down to 76 kg (= dry weight) Still has a cough  Objective:     Vital signs in last 24 hours: Filed Vitals:   10/21/12 1648 10/21/12 1936 10/21/12 1956 10/22/12 0514  BP: 122/54 115/65  124/61  Pulse: 93 88 80 87  Temp: 97.7 F (36.5 C) 98.1 F (36.7 C)  98.4 F (36.9 C)  TempSrc: Oral Oral  Oral  Resp: 19 18 18 18   Height:      Weight:    76.068 kg (167 lb 11.2 oz)  SpO2: 92% 94% 94% 93%   Weight change:   Intake/Output Summary (Last 24 hours) at 10/22/12 0755 Last data filed at 10/21/12 2030  Gross per 24 hour  Intake    380 ml  Output   3085 ml  Net  -2705 ml    Physical Exam:  Blood pressure 124/61, pulse 87, temperature 98.4 F (36.9 C), temperature source Oral, resp. rate 18, height 5' (1.524 m), weight 76.068 kg (167 lb 11.2 oz), SpO2 93.00%. Up in chair Afebrile  Right IJ 3 lumen in place Lungs diminished breath sounds Velcro type crackles at bases S1S2 no S3 Abdomen soft No edema of the lower extremties Right AVF with bruit and thrill  Labs:   Lab 10/21/12 0500 10/20/12 0427 10/20/12 0100 10/19/12 1605  NA 140 140 138 138  K 4.5 4.1 3.6 6.4*  CL 95* 97 96 94*  CO2 30 32 30 24  GLUCOSE 92 148* 198* 186*  BUN 51* 31* 26* 83*  CREATININE 6.52* 4.75* 4.19* 9.29*9.13*  ALB -- -- -- --  CALCIUM 8.5 9.0 8.7 9.1  PHOS 8.6* 4.0 -- 6.4*     Lab 10/21/12 0500 10/20/12 0427 10/19/12 1605  AST -- -- 16  ALT -- -- 10  ALKPHOS -- -- 86  BILITOT -- -- 0.2*  PROT -- -- 6.9  ALBUMIN 2.5* 2.7* 2.9*2.9*    Lab 10/21/12 0500 10/20/12 0427 10/19/12 1605  WBC 9.8 13.7* 10.9*  NEUTROABS -- 12.4* 10.1*  HGB 9.9* 11.3* 12.1  HCT 33.8* 36.6 39.8  MCV 103.0* 100.0 100.8*  PLT 188 201 216     Lab 10/20/12 0427 10/19/12 1920 10/19/12 1605  CKTOTAL -- -- 77  CKMB -- -- 5.3*  CKMBINDEX -- -- --  TROPONINI <0.30 <0.30 <0.30     Lab 10/21/12 1533 10/21/12 1332 10/21/12 0741 10/20/12 1630 10/20/12 1129    GLUCAP 91 81 93 132* 106*     No results found for this basename: IRON:30,TIBC:30,TRANSFERRIN:30,FERRITIN:30 in the last 168 hours  Studies/Results: Dg Chest Port 1 View  10/21/2012  *RADIOLOGY REPORT*  Clinical Data: Respiratory failure.  PORTABLE CHEST - 1 VIEW  Comparison: 10/20/2012  Findings: Endotracheal and nasogastric tubes been removed.  A right jugular central line shows stable positioning.  Lungs show stable interstitial prominence without overt airspace edema or pleural effusions.  Heart size is stable.  IMPRESSION: Stable interstitial lung disease.   Original Report Authenticated By: Irish Lack, M.D.          . albuterol  2.5 mg Nebulization TID  . antiseptic oral rinse  15 mL Mouth Rinse BID  . aspirin EC  81 mg Oral Daily  . calcium acetate (Phos Binder)  1,334 mg Oral TID WC & HS  . carvedilol  25 mg Oral BID WC  . darbepoetin (ARANESP) injection - DIALYSIS  60 mcg Intravenous Q Thu-HD  . ferric gluconate (FERRLECIT/NULECIT) IV  125 mg  Intravenous Q Thu-HD  . heparin  5,000 Units Subcutaneous Q8H  . rOPINIRole  1 mg Oral BID     I  have reviewed scheduled and prn medications.  (Dialysis orders outpt Dialysis at [facility]  Primary Nephrologist Caryn Section (per family)  EDW 76 kg  HD Bath 2K, 2.25 Ca  Dialyzer F160  Heparin 1600 units  Access Right upper arm AVF 15 gauge needles  No EPO or Vitamin D Will restart EPO as outpt for Hb < 10/rec'd Darbe 60 mcg on 1/23 100 mg/week venofer  ASSESSMENT/RECOMMENDATIONS  1. ESRD - cont TTS HD ([facility])   2. Hyperkalemia  Resolved with HD   3. VDRF  Extubated  Has "COPD"  Wonder about chronic interstitial lung ds based on appearance of most all CXR's in the system  (Dr. Philemon Kingdom = pulmonologist)  Has Home O2 Still with cough  4. COPD as above  5. CAD  Lat t wave changes  trops neg   6. H/O CDiff  Admit notes say "untreated"  Per daughter has been on slow taper and was on Rx PTA (See  excellent pharmacy note by Adela Glimpse D) for details of CDiff treatment  Primary Rx'd 125 QID/ vanco Hospitalist service has stopped oral vancomycin Dr. Richarda Blade note excerpted --> [facility] hospital:  Apr 7: Flagyl 500 TID x 7 days  Apr 12: vanc 250 TID x 7 days  Apr 18: vanc 125 QID x 14 days + Flagyl 500 TID x 7 days  Was at rehab facility Apr 17 for 9 days  [facility] in [place]  May 13: Flagyl 500mg  PO TID x 7 days  May 22: vanc 125mg  PO QID x 10 days  Jun 12: vanc 125mg  PO QID x 10 days  Sept 13: Flagyl 500mg  TID x 10 days  Oct 3: Flagyl 500mg  PO TID x 10 day  [facility]  Nov 7: vanc 125mg  QID x 7 days, TID x 14 days, BID x 14 days  Dec 28: vanc 125mg  QID x 7 days, BID x 7 days  Jan 4: vanc 125 TID x 14 days    7. Anemia  Hb  dropped to 9.9 Dose of Aranesp with HD 1/23 60 mcg Will resume EPO as outpt at [facility]  7. Dispo - home soon?   [name], MD [facility] [phone] Pager 10/22/2012, 7:55 AM

## 2012-10-23 DIAGNOSIS — J189 Pneumonia, unspecified organism: Secondary | ICD-10-CM

## 2012-10-23 DIAGNOSIS — A481 Legionnaires' disease: Secondary | ICD-10-CM | POA: Diagnosis present

## 2012-10-23 DIAGNOSIS — J449 Chronic obstructive pulmonary disease, unspecified: Secondary | ICD-10-CM

## 2012-10-23 LAB — BASIC METABOLIC PANEL
CO2: 30 mEq/L (ref 19–32)
Chloride: 96 mEq/L (ref 96–112)
GFR calc Af Amer: 7 mL/min — ABNORMAL LOW (ref >90)
Potassium: 4.4 mEq/L (ref 3.5–5.1)
Sodium: 137 mEq/L (ref 135–145)

## 2012-10-23 LAB — CBC
Platelets: 181 10*3/uL (ref 150–400)
RBC: 3.37 MIL/uL — ABNORMAL LOW (ref 3.87–5.11)
RDW: 16.7 % — ABNORMAL HIGH (ref 11.5–15.5)
WBC: 9.8 10*3/uL (ref 4.0–10.5)

## 2012-10-23 LAB — CLOSTRIDIUM DIFFICILE BY PCR: Toxigenic C. Difficile by PCR: POSITIVE — AB

## 2012-10-23 LAB — HEMOGLOBIN A1C
Hgb A1c MFr Bld: 5.1 % (ref <5.7)
Mean Plasma Glucose: 100 mg/dL (ref <117)

## 2012-10-23 MED ORDER — AZITHROMYCIN 500 MG PO TABS: 500.0000 mg | ORAL_TABLET | Freq: Every day | ORAL | Status: DC

## 2012-10-23 MED ORDER — VANCOMYCIN 50 MG/ML ORAL SOLUTION
250.0000 mg | Freq: Four times a day (QID) | ORAL | Status: DC
  Administered 2012-10-23: 250 mg via ORAL
  Filled 2012-10-23 (×4): qty 5

## 2012-10-23 MED ORDER — SACCHAROMYCES BOULARDII 250 MG PO CAPS: 250.0000 mg | ORAL_CAPSULE | Freq: Two times a day (BID) | ORAL | Status: DC

## 2012-10-23 NOTE — Discharge Summary (Signed)
Physician Discharge Summary  [name]:[id number] DOB: July 01, 1941 DOA: 10/19/2012  PCP: [name], MD  Admit date: 10/19/2012 Discharge date: 10/23/2012  Time spent: 30 minutes  Recommendations for Outpatient Follow-up:  1. Follow up with PCP  Discharge Diagnoses:  Principal Problem:  *Acute-on-chronic respiratory failure Active Problems:  End stage renal disease  DM2 (diabetes mellitus, type 2)  Anemia  Acute encephalopathy  C. difficile colitis  PNA (pneumonia)   Discharge Condition: stable  Diet recommendation: renal   Filed Weights   10/22/12 0514 10/23/12 0512 10/23/12 0723  Weight: 76.068 kg (167 lb 11.2 oz) 76.386 kg (168 lb 6.4 oz) 77.8 kg (171 lb 8.3 oz)    History of present illness:  72 year old female with PMH ESRD on HD T,Th,Sat, COPD, CAD, and untreated Cdiff admitted from [facility] for worsening SOB on 1/21 and HD.    Hospital Course:  Acute ventilatory-on-chronic respiratory failure due to PNA.  - azithromycin 1.24.2014-1.29.2014. Saturation > 97% on 3L. On home O2 3L - Respiratory culture grew abundant Moraxella catarrhalis  - Echo during hospital course with EF 65-70% and severe concentric hypertrophy with increased apical thickness  - ETT 10/19/12 extubated on 10/20/2012   End stage renal disease (06/21/2012) - On HD T, Th, Sat. HD on schedule 10/21/12 and 10/22/2012.   Hyperglycemia due stress demargination: - HbgA1c 5.1.   Anemia (07/28/2012) - Receives Aranesp as needed with HD. Management as per renal.   Acute encephalopathy (09/22/2012) Likely secondary to respiratory and metabolic acidosis. Resolved during hospital course   History of C. difficile colitis (09/22/2012) - Oral vancomycin was discontinued on admission. No further BM. Stool for C PCR  if positive will be a false positive. - Resume oral vanc tapared as we do not have records. To follow up with PCP.  Hypertension  Stable. Continue current medications. Continue to  monitor.    Procedures:  none (i.e. Studies not automatically included, echos, thoracentesis, etc; not x-rays)  Consultations:  Renal  PCCM  Discharge Exam: Filed Vitals:   10/23/12 0800 10/23/12 0830 10/23/12 0900 10/23/12 0930  BP: 118/52 126/47 125/53 97/76  Pulse: 78 86 91 87  Temp:      TempSrc:      Resp:  18 15 18   Height:      Weight:      SpO2: 100% 100%      See progress note.  Discharge Instructions  Discharge Orders    Future Appointments: Provider: Department: Dept Phone: Center:   08/30/2013 2:30 PM [facility] [phone] [facility]   08/30/2013 3:40 PM Evern Bio, NP [facility] [phone] [facility]     Future Orders Please Complete By Expires   Diet - low sodium heart healthy      Increase activity slowly          Medication List     As of 10/23/2012  9:49 AM    STOP taking these medications         vancomycin 50 mg/mL oral solution   Commonly known as: VANCOCIN      TAKE these medications         albuterol (2.5 MG/3ML) 0.083% nebulizer solution   Commonly known as: PROVENTIL   Take 2.5 mg by nebulization every 6 (six) hours as needed. For shortness of breath      azithromycin 500 MG tablet   Commonly known as: ZITHROMAX   Take 1 tablet (500 mg total)  by mouth daily.      calcium acetate (Phos Binder) 667 MG/5ML Soln   Commonly known as: PHOSLYRA   Take 1,334 mg by mouth 4 (four) times daily.      carvedilol 25 MG tablet   Commonly known as: COREG   Take 25 mg by mouth 2 (two) times daily with a meal.      darbepoetin 60 MCG/0.3ML Soln   Commonly known as: ARANESP   Inject 0.3 mLs (60 mcg total) into the vein every Thursday with hemodialysis.      ezetimibe 10 MG tablet   Commonly known as: ZETIA   Take 10 mg by mouth daily.      fluticasone 110 MCG/ACT inhaler   Commonly known as: FLOVENT HFA   Inhale 1 puff into the lungs 2 (two) times daily.       rOPINIRole 1 MG tablet   Commonly known as: REQUIP   Take 1 mg by mouth 2 (two) times daily.      saccharomyces boulardii 250 MG capsule   Commonly known as: FLORASTOR   Take 1 capsule (250 mg total) by mouth 2 (two) times daily.      sodium chloride 0.9 % SOLN 100 mL with ferric gluconate 12.5 MG/ML SOLN 125 mg   Inject 125 mg into the vein every Thursday with hemodialysis.           Follow-up Information    Follow up with GRISSO,GREG, MD. In 2 weeks. (hospital follow up)    Contact information:   [address] [place] [address] [phone]           The results of significant diagnostics from this hospitalization (including imaging, microbiology, ancillary and laboratory) are listed below for reference.    Significant Diagnostic Studies: Dg Chest Port 1 View  10/21/2012  *RADIOLOGY REPORT*  Clinical Data: Respiratory failure.  PORTABLE CHEST - 1 VIEW  Comparison: 10/20/2012  Findings: Endotracheal and nasogastric tubes been removed.  A right jugular central line shows stable positioning.  Lungs show stable interstitial prominence without overt airspace edema or pleural effusions.  Heart size is stable.  IMPRESSION: Stable interstitial lung disease.   Original Report Authenticated By: Irish Lack, M.D.    Dg Chest Port 1 View  10/20/2012  *RADIOLOGY REPORT*  Clinical Data: Ventilator.  Respiratory failure.  PORTABLE CHEST - 1 VIEW  Comparison: 10/19/2012  Findings: Interval removal of endotracheal tube.  Right central line is unchanged.  Interval placement of NG tube.  Continued diffuse interstitial prominence/disease.  Heart is borderline in size.  Left retrocardiac opacity, likely atelectasis.  No visible effusions.  IMPRESSION: Interval extubation. Otherwise no significant change.   Original Report Authenticated By: Charlett Nose, M.D.    Dg Chest Port 1 View  10/19/2012  *RADIOLOGY REPORT*  Clinical Data: Shortness of breath  PORTABLE CHEST - 1 VIEW  Comparison:  10/19/2012 1057 hours  Findings: The heart and pulmonary vascularity are stable.  Diffuse vascular congestion is again identified but mildly improved when compared with prior exam.  An endotracheal tube remains in satisfactory position.  The right jugular line is again noted.  No pneumothorax is seen.  IMPRESSION: Improved vascular congestion when compared with the prior exam although persistent interstitial changes remain.   Original Report Authenticated By: Alcide Clever, M.D.     Microbiology: Recent Results (from the past 240 hour(s))  RESPIRATORY VIRUS PANEL     Status: Normal   Collection Time   10/19/12  4:05 PM  Component Value Range Status Comment   Source - RVPAN NASOPHARYNGEAL   Final    Respiratory Syncytial Virus A NOT DETECTED   Final    Respiratory Syncytial Virus B NOT DETECTED   Final    Influenza A NOT DETECTED   Final    Influenza B NOT DETECTED   Final    Parainfluenza 1 NOT DETECTED   Final    Parainfluenza 2 NOT DETECTED   Final    Parainfluenza 3 NOT DETECTED   Final    Metapneumovirus NOT DETECTED   Final    Rhinovirus NOT DETECTED   Final    Adenovirus NOT DETECTED   Final    Influenza A H1 NOT DETECTED   Final    Influenza A H3 NOT DETECTED   Final   MRSA PCR SCREENING     Status: Normal   Collection Time   10/19/12  4:05 PM      Component Value Range Status Comment   MRSA by PCR NEGATIVE  NEGATIVE Final   CULTURE, BLOOD (ROUTINE X 2)     Status: Normal (Preliminary result)   Collection Time   10/19/12  4:09 PM      Component Value Range Status Comment   Specimen Description BLOOD LEFT HAND   Final    Special Requests BOTTLES DRAWN AEROBIC ONLY 3CC   Final    Culture  Setup Time 10/20/2012 00:23   Final    Culture     Final    Value:        BLOOD CULTURE RECEIVED NO GROWTH TO DATE CULTURE WILL BE HELD FOR 5 DAYS BEFORE ISSUING A FINAL NEGATIVE REPORT   Report Status PENDING   Incomplete   CULTURE, BLOOD (ROUTINE X 2)     Status: Normal (Preliminary  result)   Collection Time   10/19/12  4:20 PM      Component Value Range Status Comment   Specimen Description BLOOD CENTRAL LINE   Final    Special Requests BOTTLES DRAWN AEROBIC ONLY 7CC   Final    Culture  Setup Time 10/20/2012 00:23   Final    Culture     Final    Value:        BLOOD CULTURE RECEIVED NO GROWTH TO DATE CULTURE WILL BE HELD FOR 5 DAYS BEFORE ISSUING A FINAL NEGATIVE REPORT   Report Status PENDING   Incomplete   CULTURE, RESPIRATORY     Status: Normal   Collection Time   10/19/12  6:38 PM      Component Value Range Status Comment   Specimen Description TRACHEAL ASPIRATE   Final    Special Requests NONE   Final    Gram Stain     Final    Value: ABUNDANT WBC PRESENT,BOTH PMN AND MONONUCLEAR     FEW SQUAMOUS EPITHELIAL CELLS PRESENT     ABUNDANT GRAM NEGATIVE COCCI     RARE GRAM POSITIVE COCCI     IN PAIRS   Culture     Final    Value: ABUNDANT MORAXELLA CATARRHALIS(BRANHAMELLA)     Note: BETA LACTAMASE POSITIVE   Report Status 10/21/2012 FINAL   Final      Labs: Basic Metabolic Panel:  Lab 10/23/12 1610 10/21/12 0500 10/20/12 0427 10/20/12 0100 10/19/12 1605  NA 137 140 140 138 138  K 4.4 4.5 4.1 3.6 6.4*  CL 96 95* 97 96 94*  CO2 30 30 32 30 24  GLUCOSE 90 92 148* 198*  186*  BUN 47* 51* 31* 26* 83*  CREATININE 6.33* 6.52* 4.75* 4.19* 9.29*9.13*  CALCIUM 8.9 8.5 9.0 8.7 9.1  MG -- 2.4 2.2 -- --  PHOS -- 8.6* 4.0 -- 6.4*   Liver Function Tests:  Lab 10/21/12 0500 10/20/12 0427 10/19/12 1605  AST -- -- 16  ALT -- -- 10  ALKPHOS -- -- 86  BILITOT -- -- 0.2*  PROT -- -- 6.9  ALBUMIN 2.5* 2.7* 2.9*2.9*   No results found for this basename: LIPASE:5,AMYLASE:5 in the last 168 hours No results found for this basename: AMMONIA:5 in the last 168 hours CBC:  Lab 10/23/12 0500 10/21/12 0500 10/20/12 0427 10/19/12 1605  WBC 9.8 9.8 13.7* 10.9*  NEUTROABS -- -- 12.4* 10.1*  HGB 10.3* 9.9* 11.3* 12.1  HCT 34.6* 33.8* 36.6 39.8  MCV 102.7* 103.0* 100.0  100.8*  PLT 181 188 201 216   Cardiac Enzymes:  Lab 10/20/12 0427 10/19/12 1920 10/19/12 1605  CKTOTAL -- -- 77  CKMB -- -- 5.3*  CKMBINDEX -- -- --  TROPONINI <0.30 <0.30 <0.30   BNP: BNP (last 3 results)  Basename 10/19/12 1605 08/19/12 2020  PROBNP 17356.0* 10460.0*   CBG:  Lab 10/21/12 1533 10/21/12 1332 10/21/12 0741 10/20/12 1630 10/20/12 1129  GLUCAP 91 81 93 132* 106*       Signed:  FELIZ ORTIZ, ABRAHAM  [facility] 10/23/2012, 9:49 AM

## 2012-10-23 NOTE — Progress Notes (Addendum)
[facility] PROGRESS NOTE  Assessment/Plan: Acute ventilatory-on-chronic respiratory failure due to PNA. - azithromycin 1.24.2014. Saturation > 97% on 3L. - Respiratory culture grew abundant Moraxella catarrhalis - Echo during hospital course with EF 65-70% and severe concentric hypertrophy with increased apical thickness - ETT 10/19/12 extubated on 10/20/2012  End stage renal disease (06/21/2012) - On HD T, Th, Sat. HD on schedule 10/21/12 and 10/22/2012.  Hyperglycemia due stress demargination: - HbgA1c 5.1.  Anemia (07/28/2012) - Receives Aranesp as needed with HD. Management as per renal.   Acute encephalopathy (09/22/2012) Likely secondary to respiratory and metabolic acidosis. Resolved during hospital course  History of C. difficile colitis (09/22/2012) - Oral vancomycin was discontinued on admission. No further BM.  Stool for C PCR . if positive will be a false positive  Resume oral vanc as she is not tapared  Hypertension  Stable. Continue current medications. Continue to monitor.  Code Status: Presumed full code.  Family Communication: No family at bedside.  Disposition Plan: Pending. PT recommending SNF, patient wants to go home with home health PT.    Consultants:  Nephrology  PCCM  Procedures: - Echo during hospital course with EF 65-70% and severe concentric hypertrophy with increased apical thickness   Antibiotics:  azythro  vancomycin (indicate start date, and stop date if known)  HPI/Subjective: No complains No BM.  Objective: Filed Vitals:   10/23/12 0739 10/23/12 0800 10/23/12 0830 10/23/12 0900  BP: 148/65 118/52 126/47 125/53  Pulse: 79 78 86 91  Temp:      TempSrc:      Resp: 16  18 15   Height:      Weight:      SpO2: 100% 100% 100%     Intake/Output Summary (Last 24 hours) at 10/23/12 0932 Last data filed at 10/23/12 0521  Gross per 24 hour  Intake    600 ml  Output      0 ml  Net    600 ml   Filed Weights   10/22/12  0514 10/23/12 0512 10/23/12 0723  Weight: 76.068 kg (167 lb 11.2 oz) 76.386 kg (168 lb 6.4 oz) 77.8 kg (171 lb 8.3 oz)    Exam:  General: Alert, awake, oriented x3, in no acute distress.  HEENT: No bruits, no goiter.  Heart: Regular rate and rhythm, without murmurs, rubs, gallops.  Lungs: Good air movement, crackles bilaterally Abdomen: not tender, nondistended, positive bowel sounds.  Neuro: Grossly intact, nonfocal.   Data Reviewed: Basic Metabolic Panel:  Lab 10/23/12 1610 10/21/12 0500 10/20/12 0427 10/20/12 0100 10/19/12 1605  NA 137 140 140 138 138  K 4.4 4.5 4.1 3.6 6.4*  CL 96 95* 97 96 94*  CO2 30 30 32 30 24  GLUCOSE 90 92 148* 198* 186*  BUN 47* 51* 31* 26* 83*  CREATININE 6.33* 6.52* 4.75* 4.19* 9.29*9.13*  CALCIUM 8.9 8.5 9.0 8.7 9.1  MG -- 2.4 2.2 -- --  PHOS -- 8.6* 4.0 -- 6.4*   Liver Function Tests:  Lab 10/21/12 0500 10/20/12 0427 10/19/12 1605  AST -- -- 16  ALT -- -- 10  ALKPHOS -- -- 86  BILITOT -- -- 0.2*  PROT -- -- 6.9  ALBUMIN 2.5* 2.7* 2.9*2.9*   No results found for this basename: LIPASE:5,AMYLASE:5 in the last 168 hours No results found for this basename: AMMONIA:5 in the last 168 hours CBC:  Lab 10/23/12 0500 10/21/12 0500 10/20/12 0427 10/19/12 1605  WBC 9.8 9.8 13.7* 10.9*  NEUTROABS -- -- 12.4*  10.1*  HGB 10.3* 9.9* 11.3* 12.1  HCT 34.6* 33.8* 36.6 39.8  MCV 102.7* 103.0* 100.0 100.8*  PLT 181 188 201 216   Cardiac Enzymes:  Lab 10/20/12 0427 10/19/12 1920 10/19/12 1605  CKTOTAL -- -- 77  CKMB -- -- 5.3*  CKMBINDEX -- -- --  TROPONINI <0.30 <0.30 <0.30   BNP (last 3 results)  Basename 10/19/12 1605 08/19/12 2020  PROBNP 17356.0* 10460.0*   CBG:  Lab 10/21/12 1533 10/21/12 1332 10/21/12 0741 10/20/12 1630 10/20/12 1129  GLUCAP 91 81 93 132* 106*    Recent Results (from the past 240 hour(s))  RESPIRATORY VIRUS PANEL     Status: Normal   Collection Time   10/19/12  4:05 PM      Component Value Range Status Comment    Source - RVPAN NASOPHARYNGEAL   Final    Respiratory Syncytial Virus A NOT DETECTED   Final    Respiratory Syncytial Virus B NOT DETECTED   Final    Influenza A NOT DETECTED   Final    Influenza B NOT DETECTED   Final    Parainfluenza 1 NOT DETECTED   Final    Parainfluenza 2 NOT DETECTED   Final    Parainfluenza 3 NOT DETECTED   Final    Metapneumovirus NOT DETECTED   Final    Rhinovirus NOT DETECTED   Final    Adenovirus NOT DETECTED   Final    Influenza A H1 NOT DETECTED   Final    Influenza A H3 NOT DETECTED   Final   MRSA PCR SCREENING     Status: Normal   Collection Time   10/19/12  4:05 PM      Component Value Range Status Comment   MRSA by PCR NEGATIVE  NEGATIVE Final   CULTURE, BLOOD (ROUTINE X 2)     Status: Normal (Preliminary result)   Collection Time   10/19/12  4:09 PM      Component Value Range Status Comment   Specimen Description BLOOD LEFT HAND   Final    Special Requests BOTTLES DRAWN AEROBIC ONLY 3CC   Final    Culture  Setup Time 10/20/2012 00:23   Final    Culture     Final    Value:        BLOOD CULTURE RECEIVED NO GROWTH TO DATE CULTURE WILL BE HELD FOR 5 DAYS BEFORE ISSUING A FINAL NEGATIVE REPORT   Report Status PENDING   Incomplete   CULTURE, BLOOD (ROUTINE X 2)     Status: Normal (Preliminary result)   Collection Time   10/19/12  4:20 PM      Component Value Range Status Comment   Specimen Description BLOOD CENTRAL LINE   Final    Special Requests BOTTLES DRAWN AEROBIC ONLY [facility]   Final    Culture  Setup Time 10/20/2012 00:23   Final    Culture     Final    Value:        BLOOD CULTURE RECEIVED NO GROWTH TO DATE CULTURE WILL BE HELD FOR 5 DAYS BEFORE ISSUING A FINAL NEGATIVE REPORT   Report Status PENDING   Incomplete   CULTURE, RESPIRATORY     Status: Normal   Collection Time   10/19/12  6:38 PM      Component Value Range Status Comment   Specimen Description TRACHEAL ASPIRATE   Final    Special Requests NONE   Final    Gram Stain  Final     Value: ABUNDANT WBC PRESENT,BOTH PMN AND MONONUCLEAR     FEW SQUAMOUS EPITHELIAL CELLS PRESENT     ABUNDANT GRAM NEGATIVE COCCI     RARE GRAM POSITIVE COCCI     IN PAIRS   Culture     Final    Value: ABUNDANT MORAXELLA CATARRHALIS(BRANHAMELLA)     Note: BETA LACTAMASE POSITIVE   Report Status 10/21/2012 FINAL   Final      Studies: No results found.  Scheduled Meds:   . albuterol  2.5 mg Nebulization TID  . antiseptic oral rinse  15 mL Mouth Rinse BID  . aspirin EC  81 mg Oral Daily  . azithromycin  500 mg Oral Daily  . calcium acetate (Phos Binder)  1,334 mg Oral TID WC & HS  . carvedilol  25 mg Oral BID WC  . darbepoetin (ARANESP) injection - DIALYSIS  60 mcg Intravenous Q Thu-HD  . ferric gluconate (FERRLECIT/NULECIT) IV  125 mg Intravenous Q Thu-HD  . heparin  5,000 Units Subcutaneous Q8H  . rOPINIRole  1 mg Oral BID  . saccharomyces boulardii  250 mg Oral BID   Continuous Infusions:    Marinda Elk  [facility] Pager [phone].  If 8PM-8AM, please contact night-coverage at [email or web address], password [facility] 10/23/2012, 9:32 AM  LOS: 4 days

## 2012-10-23 NOTE — Progress Notes (Signed)
Subjective: feels good Seen in dialysis Still has a cough Moraxella in sputum  Objective:     Vital signs in last 24 hours: Filed Vitals:   10/23/12 0512 10/23/12 0723 10/23/12 0739 10/23/12 0800  BP: 145/47 150/67 148/65 118/52  Pulse: 75 82 79 78  Temp: 98.1 F (36.7 C) 97.4 F (36.3 C)    TempSrc: Oral Oral    Resp: 18 18 16    Height:      Weight: 76.386 kg (168 lb 6.4 oz) 77.8 kg (171 lb 8.3 oz)    SpO2: 97% 98% 100% 100%   Weight change: -3.214 kg (-7 lb 1.4 oz)  Intake/Output Summary (Last 24 hours) at 10/23/12 0838 Last data filed at 10/23/12 0521  Gross per 24 hour  Intake    600 ml  Output      0 ml  Net    600 ml    Physical Exam:  Blood pressure 118/52, pulse 78, temperature 97.4 F (36.3 C), temperature source Oral, resp. rate 16, height 5' (1.524 m), weight 77.8 kg (171 lb 8.3 oz), SpO2 100.00%. On dialysis  Coughing Afebrile  Right IJ 3 lumen in place Lungs diminished breath sounds Velcro type crackles at bases S1S2 no S3 Abdomen soft No edema of the lower extremties Right AVF with bruit and thrill - cannulated at the present time  Labs:   Lab 10/23/12 0500 10/21/12 0500 10/20/12 0427 10/20/12 0100 10/19/12 1605  NA 137 140 140 138 138  K 4.4 4.5 4.1 3.6 6.4*  CL 96 95* 97 96 94*  CO2 30 30 32 30 24  GLUCOSE 90 92 148* 198* 186*  BUN 47* 51* 31* 26* 83*  CREATININE 6.33* 6.52* 4.75* 4.19* 9.29*9.13*  ALB -- -- -- -- --  CALCIUM 8.9 8.5 9.0 8.7 9.1  PHOS -- 8.6* 4.0 -- 6.4*     Lab 10/21/12 0500 10/20/12 0427 10/19/12 1605  AST -- -- 16  ALT -- -- 10  ALKPHOS -- -- 86  BILITOT -- -- 0.2*  PROT -- -- 6.9  ALBUMIN 2.5* 2.7* 2.9*2.9*    Lab 10/23/12 0500 10/21/12 0500 10/20/12 0427 10/19/12 1605  WBC 9.8 9.8 13.7* 10.9*  NEUTROABS -- -- 12.4* 10.1*  HGB 10.3* 9.9* 11.3* 12.1  HCT 34.6* 33.8* 36.6 39.8  MCV 102.7* 103.0* 100.0 100.8*  PLT 181 188 201 216     Lab 10/20/12 0427 10/19/12 1920 10/19/12 1605  CKTOTAL -- -- 77  CKMB  -- -- 5.3*  CKMBINDEX -- -- --  TROPONINI <0.30 <0.30 <0.30     Lab 10/21/12 1533 10/21/12 1332 10/21/12 0741 10/20/12 1630 10/20/12 1129  GLUCAP 91 81 93 132* 106*   Specimen Description  TRACHEAL ASPIRATE  Final  Special Requests  NONE  Final  Gram Stain  Final  Value:  ABUNDANT WBC PRESENT,BOTH PMN AND MONONUCLEAR  FEW SQUAMOUS EPITHELIAL CELLS PRESENT  ABUNDANT GRAM NEGATIVE COCCI  RARE GRAM POSITIVE COCCI  IN PAIRS  Culture  Final  Value:  ABUNDANT MORAXELLA CATARRHALIS(BRANHAMELLA)  Note: BETA LACTAMASE POSITIVE  Report Status  10/21/2012 FINAL     No results found for this basename: IRON:30,TIBC:30,TRANSFERRIN:30,FERRITIN:30 in the last 168 hours  Studies/Results: No results found.       [name] albuterol  2.5 mg Nebulization TID  . antiseptic oral rinse  15 mL Mouth Rinse BID  . aspirin EC  81 mg Oral Daily  . azithromycin  500 mg Oral Daily  . calcium acetate (Phos Binder)  1,334 mg Oral TID WC & HS  . carvedilol  25 mg Oral BID WC  . darbepoetin (ARANESP) injection - DIALYSIS  60 mcg Intravenous Q Thu-HD  . ferric gluconate (FERRLECIT/NULECIT) IV  125 mg Intravenous Q Thu-HD  . heparin  5,000 Units Subcutaneous Q8H  . rOPINIRole  1 mg Oral BID  . saccharomyces boulardii  250 mg Oral BID     I  have reviewed scheduled and prn medications.  (Dialysis orders outpt) Dialysis at [facility]  Primary Nephrologist Fox EDW 76 kg  HD Bath 2K, 2.25 Ca  Dialyzer F160  Heparin 1600 units  Access Right upper arm AVF 15 gauge needles  No EPO or Vitamin D Will restart EPO as outpt for Hb < 10/rec'd Darbe 60 mcg on 1/23 100 mg/week venofer  ASSESSMENT/RECOMMENDATIONS  1. ESRD - cont TTS HD ([facility])   2. Hyperkalemia  Resolved with HD   3. VDRF  Extubated  COPD Wonder about chronic interstitial lung ds based on appearance of most all CXR's in the system  (Dr. Philemon Kingdom = pulmonologist)  Has Home O2  4. Cough Moraxella in sputum Still  with cough For 5 day course of azithromycin (started 1/24)  5. CAD  Lat t wave changes  trops neg   6. H/O CDiff  Admit notes say "untreated"  Per daughter has been on slow taper and was on Rx PTA (See excellent pharmacy note by Adela Glimpse D) for details of CDiff treatment  Primary Rx'd 125 QID/ vanco Hospitalist service has stopped oral vancomycin Stool for repeat CDiff pending  Dr. Richarda Blade note excerpted --> [facility] hospital:  Apr 7: Flagyl 500 TID x 7 days  Apr 12: vanc 250 TID x 7 days  Apr 18: vanc 125 QID x 14 days + Flagyl 500 TID x 7 days  Was at rehab facility Apr 17 for 9 days  [facility] [place]  May 13: Flagyl 500mg  PO TID x 7 days  May 22: vanc 125mg  PO QID x 10 days  Jun 12: vanc 125mg  PO QID x 10 days  Sept 13: Flagyl 500mg  TID x 10 days  Oct 3: Flagyl 500mg  PO TID x 10 day  [facility]  Nov 7: vanc 125mg  QID x 7 days, TID x 14 days, BID x 14 days  Dec 28: vanc 125mg  QID x 7 days, BID x 7 days  Jan 4: vanc 125 TID x 14 days    7. Anemia  Hb  dropped to 9.9 Dose of Aranesp with HD 1/23 60 mcg Will resume EPO as outpt at [facility]  7. Dispo - home soon?   [name], MD [facility] [phone] Pager 10/23/2012, 8:38 AM

## 2012-10-23 NOTE — Progress Notes (Signed)
NCM spoke to pt and states she wants [facility]. Explained NCM will fax info to arrange [facility]. Will follow up on 1/27 with office for soc. Pt states she has [facility] contact info. NCM will make sure they can accommodate her insurance. Isidoro Donning RN CCM Case Mgmt phone [phone]

## 2012-10-23 NOTE — Progress Notes (Signed)
Pt given discharge instructions and reviewed with patient and family. All questions answered. Damisha Wolff, Randall An RN

## 2012-10-23 NOTE — Procedures (Signed)
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

## 2012-10-24 NOTE — Progress Notes (Signed)
NCM received call from [facility]. They cannot accept pt for [facility]. Contacted pt and she is agreeable to [facility]. Faxed referral to [facility] for [facility] PT/OT. Isidoro Donning RN CCM Case Mgmt phone [phone]

## 2012-10-26 LAB — CULTURE, BLOOD (ROUTINE X 2)

## 2012-10-26 NOTE — Progress Notes (Signed)
Patient seen and examined, agree with above note.  I dictated the care and orders written for this patient under my direction.  Isabel Ardila G Issam Carlyon, MD [phone] 

## 2012-10-26 NOTE — Discharge Summary (Signed)
Patient seen and examined, agree with above note.  I dictated the care and orders written for this patient under my direction.  Wesam G Yacoub, MD [phone] 

## 2012-12-28 ENCOUNTER — Inpatient Hospital Stay (HOSPITAL_COMMUNITY)
Admission: AD | Admit: 2012-12-28 | Discharge: 2012-12-31 | DRG: 377 | Disposition: A | Discharge status: Home / Self Care | Payer: Medicare Other | Source: Other Acute Inpatient Hospital | Attending: Internal Medicine | Admitting: Internal Medicine

## 2012-12-28 ENCOUNTER — Encounter (HOSPITAL_COMMUNITY): Payer: Self-pay | Admitting: Internal Medicine

## 2012-12-28 ENCOUNTER — Inpatient Hospital Stay: Admit: 2012-12-28 | Payer: Self-pay | Admitting: Internal Medicine

## 2012-12-28 DIAGNOSIS — R531 Weakness: Secondary | ICD-10-CM

## 2012-12-28 DIAGNOSIS — Z992 Dependence on renal dialysis: Secondary | ICD-10-CM | POA: Diagnosis present

## 2012-12-28 DIAGNOSIS — D62 Acute posthemorrhagic anemia: Secondary | ICD-10-CM | POA: Diagnosis present

## 2012-12-28 DIAGNOSIS — K648 Other hemorrhoids: Secondary | ICD-10-CM | POA: Diagnosis present

## 2012-12-28 DIAGNOSIS — G4730 Sleep apnea, unspecified: Secondary | ICD-10-CM | POA: Diagnosis present

## 2012-12-28 DIAGNOSIS — K219 Gastro-esophageal reflux disease without esophagitis: Secondary | ICD-10-CM | POA: Diagnosis present

## 2012-12-28 DIAGNOSIS — E213 Hyperparathyroidism, unspecified: Secondary | ICD-10-CM

## 2012-12-28 DIAGNOSIS — Z85528 Personal history of other malignant neoplasm of kidney: Secondary | ICD-10-CM

## 2012-12-28 DIAGNOSIS — Z9861 Coronary angioplasty status: Secondary | ICD-10-CM

## 2012-12-28 DIAGNOSIS — J449 Chronic obstructive pulmonary disease, unspecified: Secondary | ICD-10-CM

## 2012-12-28 DIAGNOSIS — I4891 Unspecified atrial fibrillation: Secondary | ICD-10-CM | POA: Diagnosis present

## 2012-12-28 DIAGNOSIS — N2581 Secondary hyperparathyroidism of renal origin: Secondary | ICD-10-CM | POA: Diagnosis present

## 2012-12-28 DIAGNOSIS — A0472 Enterocolitis due to Clostridium difficile, not specified as recurrent: Secondary | ICD-10-CM

## 2012-12-28 DIAGNOSIS — R5381 Other malaise: Secondary | ICD-10-CM

## 2012-12-28 DIAGNOSIS — J9600 Acute respiratory failure, unspecified whether with hypoxia or hypercapnia: Secondary | ICD-10-CM

## 2012-12-28 DIAGNOSIS — J9610 Chronic respiratory failure, unspecified whether with hypoxia or hypercapnia: Secondary | ICD-10-CM | POA: Diagnosis present

## 2012-12-28 DIAGNOSIS — J9620 Acute and chronic respiratory failure, unspecified whether with hypoxia or hypercapnia: Secondary | ICD-10-CM

## 2012-12-28 DIAGNOSIS — I120 Hypertensive chronic kidney disease with stage 5 chronic kidney disease or end stage renal disease: Secondary | ICD-10-CM | POA: Diagnosis present

## 2012-12-28 DIAGNOSIS — R5383 Other fatigue: Secondary | ICD-10-CM

## 2012-12-28 DIAGNOSIS — R0602 Shortness of breath: Secondary | ICD-10-CM

## 2012-12-28 DIAGNOSIS — J810 Acute pulmonary edema: Secondary | ICD-10-CM

## 2012-12-28 DIAGNOSIS — Z79899 Other long term (current) drug therapy: Secondary | ICD-10-CM

## 2012-12-28 DIAGNOSIS — I2789 Other specified pulmonary heart diseases: Secondary | ICD-10-CM | POA: Diagnosis present

## 2012-12-28 DIAGNOSIS — G9340 Encephalopathy, unspecified: Secondary | ICD-10-CM

## 2012-12-28 DIAGNOSIS — I252 Old myocardial infarction: Secondary | ICD-10-CM

## 2012-12-28 DIAGNOSIS — K644 Residual hemorrhoidal skin tags: Secondary | ICD-10-CM | POA: Diagnosis present

## 2012-12-28 DIAGNOSIS — N186 End stage renal disease: Secondary | ICD-10-CM

## 2012-12-28 DIAGNOSIS — E7800 Pure hypercholesterolemia, unspecified: Secondary | ICD-10-CM | POA: Diagnosis present

## 2012-12-28 DIAGNOSIS — J811 Chronic pulmonary edema: Secondary | ICD-10-CM

## 2012-12-28 DIAGNOSIS — D72829 Elevated white blood cell count, unspecified: Secondary | ICD-10-CM

## 2012-12-28 DIAGNOSIS — K5731 Diverticulosis of large intestine without perforation or abscess with bleeding: Principal | ICD-10-CM | POA: Diagnosis present

## 2012-12-28 DIAGNOSIS — Z87891 Personal history of nicotine dependence: Secondary | ICD-10-CM

## 2012-12-28 DIAGNOSIS — K922 Gastrointestinal hemorrhage, unspecified: Secondary | ICD-10-CM

## 2012-12-28 DIAGNOSIS — K5732 Diverticulitis of large intestine without perforation or abscess without bleeding: Secondary | ICD-10-CM

## 2012-12-28 DIAGNOSIS — D649 Anemia, unspecified: Secondary | ICD-10-CM

## 2012-12-28 DIAGNOSIS — J4489 Other specified chronic obstructive pulmonary disease: Secondary | ICD-10-CM

## 2012-12-28 DIAGNOSIS — E119 Type 2 diabetes mellitus without complications: Secondary | ICD-10-CM

## 2012-12-28 DIAGNOSIS — D126 Benign neoplasm of colon, unspecified: Secondary | ICD-10-CM | POA: Diagnosis present

## 2012-12-28 DIAGNOSIS — T82898A Other specified complication of vascular prosthetic devices, implants and grafts, initial encounter: Secondary | ICD-10-CM

## 2012-12-28 DIAGNOSIS — Z9981 Dependence on supplemental oxygen: Secondary | ICD-10-CM

## 2012-12-28 DIAGNOSIS — J189 Pneumonia, unspecified organism: Secondary | ICD-10-CM

## 2012-12-28 LAB — COMPREHENSIVE METABOLIC PANEL
AST: 16 U/L (ref 0–37)
BUN: 40 mg/dL — ABNORMAL HIGH (ref 6–23)
CO2: 31 mEq/L (ref 19–32)
Chloride: 97 mEq/L (ref 96–112)
Creatinine, Ser: 4.91 mg/dL — ABNORMAL HIGH (ref 0.50–1.10)
GFR calc non Af Amer: 8 mL/min — ABNORMAL LOW (ref >90)
Glucose, Bld: 106 mg/dL — ABNORMAL HIGH (ref 70–99)
Total Bilirubin: 0.2 mg/dL — ABNORMAL LOW (ref 0.3–1.2)

## 2012-12-28 LAB — CBC
MCH: 31.1 pg (ref 26.0–34.0)
Platelets: 189 10*3/uL (ref 150–400)
RBC: 3.28 MIL/uL — ABNORMAL LOW (ref 3.87–5.11)
WBC: 9.6 10*3/uL (ref 4.0–10.5)

## 2012-12-28 LAB — HEMOGLOBIN A1C
Hgb A1c MFr Bld: 5.1 % (ref <5.7)
Mean Plasma Glucose: 100 mg/dL (ref <117)

## 2012-12-28 MED ORDER — IPRATROPIUM BROMIDE 0.02 % IN SOLN: 500.0000 ug | Freq: Four times a day (QID) | RESPIRATORY_TRACT | Status: DC | PRN

## 2012-12-28 MED ORDER — ALBUTEROL SULFATE (5 MG/ML) 0.5% IN NEBU
2.5000 mg | INHALATION_SOLUTION | Freq: Four times a day (QID) | RESPIRATORY_TRACT | Status: DC
  Administered 2012-12-28 – 2012-12-29 (×5): 2.5 mg via RESPIRATORY_TRACT
  Filled 2012-12-28 (×5): qty 0.5

## 2012-12-28 MED ORDER — FUROSEMIDE 80 MG PO TABS
80.0000 mg | ORAL_TABLET | Freq: Two times a day (BID) | ORAL | Status: DC
  Administered 2012-12-28 – 2012-12-30 (×4): 80 mg via ORAL
  Filled 2012-12-28 (×8): qty 1

## 2012-12-28 MED ORDER — CALCIUM ACETATE 667 MG PO CAPS
1334.0000 mg | ORAL_CAPSULE | Freq: Three times a day (TID) | ORAL | Status: DC
  Administered 2012-12-29 – 2012-12-30 (×4): 1334 mg via ORAL
  Filled 2012-12-28 (×10): qty 2

## 2012-12-28 MED ORDER — INSULIN ASPART 100 UNIT/ML ~~LOC~~ SOLN: 0.0000 [IU] | Freq: Three times a day (TID) | SUBCUTANEOUS | Status: DC

## 2012-12-28 MED ORDER — EZETIMIBE 10 MG PO TABS
10.0000 mg | ORAL_TABLET | Freq: Every day | ORAL | Status: DC
  Filled 2012-12-28: qty 1

## 2012-12-28 MED ORDER — ONDANSETRON HCL 4 MG/2ML IJ SOLN: 4.0000 mg | Freq: Four times a day (QID) | INTRAMUSCULAR | Status: DC | PRN

## 2012-12-28 MED ORDER — SODIUM CHLORIDE 0.9 % IV SOLN
250.0000 mL | INTRAVENOUS | Status: DC | PRN
  Administered 2012-12-31: 07:00:00 via INTRAVENOUS

## 2012-12-28 MED ORDER — FLUTICASONE PROPIONATE HFA 110 MCG/ACT IN AERO
1.0000 | INHALATION_SPRAY | Freq: Two times a day (BID) | RESPIRATORY_TRACT | Status: DC
  Administered 2012-12-28 – 2012-12-30 (×4): 1 via RESPIRATORY_TRACT
  Filled 2012-12-28 (×2): qty 12

## 2012-12-28 MED ORDER — ONDANSETRON HCL 4 MG PO TABS
4.0000 mg | ORAL_TABLET | Freq: Four times a day (QID) | ORAL | Status: DC | PRN
  Administered 2012-12-30: 4 mg via ORAL
  Filled 2012-12-28: qty 1

## 2012-12-28 MED ORDER — GLIPIZIDE 5 MG PO TABS
5.0000 mg | ORAL_TABLET | Freq: Every day | ORAL | Status: DC
  Filled 2012-12-28 (×3): qty 1

## 2012-12-28 MED ORDER — SODIUM CHLORIDE 0.9 % IJ SOLN: 3.0000 mL | INTRAMUSCULAR | Status: DC | PRN

## 2012-12-28 MED ORDER — ACETAMINOPHEN 650 MG RE SUPP: 650.0000 mg | Freq: Four times a day (QID) | RECTAL | Status: DC | PRN

## 2012-12-28 MED ORDER — ACETAMINOPHEN 325 MG PO TABS: 650.0000 mg | ORAL_TABLET | Freq: Four times a day (QID) | ORAL | Status: DC | PRN

## 2012-12-28 MED ORDER — ROPINIROLE HCL 1 MG PO TABS
1.0000 mg | ORAL_TABLET | Freq: Two times a day (BID) | ORAL | Status: DC
  Administered 2012-12-29 – 2012-12-30 (×3): 1 mg via ORAL
  Filled 2012-12-28 (×7): qty 1

## 2012-12-28 MED ORDER — PANTOPRAZOLE SODIUM 40 MG PO TBEC
40.0000 mg | DELAYED_RELEASE_TABLET | Freq: Two times a day (BID) | ORAL | Status: DC
  Administered 2012-12-28 – 2012-12-30 (×5): 40 mg via ORAL
  Filled 2012-12-28 (×4): qty 1

## 2012-12-28 MED ORDER — CARVEDILOL 3.125 MG PO TABS
3.1250 mg | ORAL_TABLET | Freq: Every day | ORAL | Status: DC
Start: 2012-12-29 — End: 2012-12-31
  Administered 2012-12-29 – 2012-12-30 (×2): 3.125 mg via ORAL
  Filled 2012-12-28 (×3): qty 1

## 2012-12-28 MED ORDER — SODIUM CHLORIDE 0.9 % IJ SOLN
3.0000 mL | Freq: Two times a day (BID) | INTRAMUSCULAR | Status: DC
  Administered 2012-12-28 – 2012-12-30 (×5): 3 mL via INTRAVENOUS

## 2012-12-28 MED ORDER — ROPINIROLE HCL 1 MG PO TABS
1.0000 mg | ORAL_TABLET | Freq: Two times a day (BID) | ORAL | Status: DC
  Administered 2012-12-28: 1 mg via ORAL
  Filled 2012-12-28: qty 1

## 2012-12-28 NOTE — Progress Notes (Signed)
Pt arrived via [facility] from [facility], VSS, Pt denies SOB, alert, paged Dr, will continue to monitor.

## 2012-12-28 NOTE — H&P (Signed)
[facility] History and Physical  [name] RUE:[id number] DOB: [birth year] DOA: 12/28/2012  Referring physician: Joanette Cook ED PCP: Suzanne Rossetti, MD   Chief Complaint: GIB and weakness/fatigue  HPI: [name] is a 72 y.o. female pmh significant for AOCD, diverticulosis, GERD, HTN, DM, ESRD and 2/2 hyperparathyroidism; transferred from [facility] hospital due to GIB and needs for HD during admission time. Patient reports 2-3 days of black/maroon stools at home, with associated weakness and fatigue. Patient endorses some intermittent epigastric/chest pain and SOB. Denies fever, chills, nausea, vomiting, dysuria, HA's or any other complaints. Hgb drop from 11.5 to 9.5 according to records from HD center and [facility] ED. Admitted for further evaluation and treatment.  Review of Systems:  Negative except as mentioned on HPI  Past Medical History  Diagnosis Date  . Carotid artery occlusion     bilat CEA in 2012?  . C. 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 Hypertension   . Atrial fibrillation April  2013  . Pulmonary hypertension   . Secondary hyperparathyroidism, renal   . COPD (chronic obstructive pulmonary disease)   . Hypercholesterolemia   . History of MI (myocardial infarction)     "they say I had 2 years ago" (08/20/2012)  . GERD (gastroesophageal reflux disease)   . History of pneumonia     "have it alot" (08/20/2012)  . Sleep apnea     "don't wear mask anymore"  . Type II diabetes mellitus   . History of blood transfusion 2013    "3 times so far in 2013:  4 pints one time; 2 pints another; ?# last time" (08/20/2012)  . Iron deficiency anemia   . Seizures 2012    after carotid surgery  . ESRD on dialysis     "[facility]; Tues, Thurs; Sat"  . Renal cell carcinoma 2005?  Marland Kitchen On home oxygen therapy     "24/7"   . Acute diverticulitis     Oct 2013, treated medically [facility], sigmoid diverticulitits   Past Surgical History   Procedure Laterality Date  . Nephrectomy  2005    Right, for Renal cell carcinoma  . Carotid endarterectomy  2012    bilaterally  . Av fistula placement  Jan. 7, 2011    Right  upper arm by Dr. Hart Cook  . Hd catheter  Aug. 22, 2013    [facility]  . Appendectomy      childhood  . Abdominal hysterectomy  1971?  Marland Kitchen Coronary angioplasty with stent placement  1990's    "1 total"  . Coronary angioplasty  1990's  . Av fistula repair  2013    right upper arm   Social History:  reports that she quit smoking about 9 years ago. Her smoking use included Cigarettes. She has a 67.5 pack-year smoking history. She has never used smokeless tobacco. She reports that she does not drink alcohol or use illicit drugs.   Allergies  Allergen Reactions  . Morphine And Related Nausea And Vomiting  . Lipitor (Atorvastatin) Other (See Comments)    Causes drowsiness  . Nsaids     Unknown reported by previous hospital    Family History  Problem Relation Age of Onset  . Cancer Mother     pancreatic  . Stroke Father   . Coronary artery disease Father   . Cancer Brother     Brain  . Kidney disease Brother     Kidney stones    Prior  to Admission medications   Medication Sig Start Date End Date Taking? Authorizing Provider  albuterol (PROVENTIL) (2.5 MG/3ML) 0.083% nebulizer solution Take 2.5 mg by nebulization every 6 (six) hours as needed. For shortness of breath   Yes Historical Provider, MD  calcium acetate (PHOSLO) 667 MG capsule Take 1,334 mg by mouth 3 (three) times daily with meals.   Yes Historical Provider, MD  carvedilol (COREG) 25 MG tablet Take 12.5 mg by mouth daily.    Yes Historical Provider, MD  ezetimibe (ZETIA) 10 MG tablet Take 10 mg by mouth daily.   Yes Historical Provider, MD  fluticasone (FLOVENT HFA) 110 MCG/ACT inhaler Inhale 1 puff into the lungs 2 (two) times daily.   Yes Historical Provider, MD  furosemide (LASIX) 40 MG tablet Take 80 mg by mouth 2 (two) times daily.    Yes Historical Provider, MD  glipiZIDE (GLUCOTROL) 5 MG tablet Take 5 mg by mouth daily as needed (if CBG is >150).   Yes Historical Provider, MD  ipratropium (ATROVENT) 0.02 % nebulizer solution Take 500 mcg by nebulization every 6 (six) hours as needed for wheezing.   Yes Historical Provider, MD  rOPINIRole (REQUIP) 1 MG tablet Take 1 mg by mouth 2 (two) times daily.   Yes Historical Provider, MD   Physical Exam: Filed Vitals:   12/28/12 1600  BP: 127/48  Temp: 98.6 F (37 C)  TempSrc: Oral  Height: 5\' 4"  (1.626 m)     General:  NAD, AAOX3, cooperative to examination and complaining of generalized weakness  Eyes: PERRL, EOMI, no nystagmus, icterus  ENT: moist MM, no erythema or exudates inside her mouth, no drainage out of ears or nostrils  Neck: supple, no thyromegaly  Cardiovascular: S1 and S2, no rubs or gallops  Respiratory: good air movement, mild rhonchi and no wheezing  Abdomen: soft, epigastric tenderness on palpation (mild), positive BS, no distension  Skin: no rash or petechiae  Musculoskeletal: no joint swelling or erythema  Psychiatric: appropriate  Neurologic: AAOX3, no motor or sensory deficit; CN intact, normal finger to nose.  Labs on Admission:  Pending  BNP (last 3 results)  Recent Labs  08/19/12 2020 10/19/12 1605  PROBNP 10460.0* 17356.0*    Assessment/Plan 1-GIB (gastrointestinal bleeding): patient with hx of diverticulosis and complaints of some epigastric pain relived by tums. Positive FOBT and active marron stools seen at ED [facility]. -Hgb was 9.5, down from 11.5  -will follow Hgb trend (CBC q12h) -type and screen -no transfusion at this moment -check iron studies -follow GI recommendations -started on PPI -clear liquid diet  2-End stage renal disease: received 3 hours treatment today prior to admission. No significant SOB or need for emergent HD at this point. Will contact renal service to continue HD during this  amdission.  3-Anemia: due to GI bleed and AOCD.  -will check iron studies -type and screen -transfuse if Hgb < 7.5 -aranesp per renal service  4-Chronic resp failure due to COPD (chronic obstructive pulmonary disease): no wheezing. Good air movement. -continue oxygen supplementation. -continue home nebs and inhalers.  5-Weakness generalized: secondary to deconditioning and anemia. -will work up her GI bleed and ask PT to evaluate once patient is more stable.   6-SOB (shortness of breath):chronic in nature. But since patient complaints of epigastric discomfort and is diabetic will cycle cardiac enzymes and check EKG.  7-Diabetes: SSI, check A1C  8-Hyperparathyroidism: continue phsopho-binders and follow renal service recommendations.  DVT: SCD's  Renal service for continuation of HD  need to be contacted. (T-T-S; had 3 hours of treatment on day of admission before feeling bad) GI ([facility], Dr. Dulce Sellar)   Code Status: Full Family Communication:daughter at bedside Disposition Plan: inpatient, step down GIB; LOS > 2 midnights. Had hx of ESRD.  Time spent: >30 minutes  Shameria Trimarco [facility] Pager [phone]  If 7PM-7AM, please contact night-coverage [email or web address] Password [facility] 12/28/2012, 4:58 PM

## 2012-12-29 ENCOUNTER — Encounter (HOSPITAL_COMMUNITY)
Admission: AD | Disposition: A | Discharge status: Home / Self Care | Payer: Self-pay | Source: Other Acute Inpatient Hospital | Attending: Internal Medicine

## 2012-12-29 ENCOUNTER — Encounter (HOSPITAL_COMMUNITY): Payer: Self-pay | Admitting: Gastroenterology

## 2012-12-29 DIAGNOSIS — D62 Acute posthemorrhagic anemia: Secondary | ICD-10-CM

## 2012-12-29 HISTORY — PX: ESOPHAGOGASTRODUODENOSCOPY: SHX5428

## 2012-12-29 LAB — GLUCOSE, CAPILLARY

## 2012-12-29 LAB — CBC
HCT: 34.2 % — ABNORMAL LOW (ref 36.0–46.0)
HCT: 34.4 % — ABNORMAL LOW (ref 36.0–46.0)
Hemoglobin: 10.6 g/dL — ABNORMAL LOW (ref 12.0–15.0)
MCH: 31.1 pg (ref 26.0–34.0)
MCHC: 30.8 g/dL (ref 30.0–36.0)
MCV: 100.9 fL — ABNORMAL HIGH (ref 78.0–100.0)
RDW: 15.7 % — ABNORMAL HIGH (ref 11.5–15.5)
WBC: 8.3 10*3/uL (ref 4.0–10.5)

## 2012-12-29 LAB — IRON AND TIBC
Iron: 45 ug/dL (ref 42–135)
TIBC: 249 ug/dL — ABNORMAL LOW (ref 250–470)
UIBC: 204 ug/dL (ref 125–400)

## 2012-12-29 LAB — FERRITIN: Ferritin: 579 ng/mL — ABNORMAL HIGH (ref 10–291)

## 2012-12-29 LAB — TROPONIN I: Troponin I: <0.3 ng/mL (ref <0.30)

## 2012-12-29 SURGERY — EGD (ESOPHAGOGASTRODUODENOSCOPY)
Anesthesia: Moderate Sedation

## 2012-12-29 MED ORDER — SODIUM CHLORIDE 0.9 % IV SOLN
INTRAVENOUS | Status: DC
  Administered 2012-12-29: 500 mL via INTRAVENOUS

## 2012-12-29 MED ORDER — DIPHENHYDRAMINE HCL 50 MG/ML IJ SOLN
INTRAMUSCULAR | Status: AC
  Filled 2012-12-29: qty 1

## 2012-12-29 MED ORDER — FENTANYL CITRATE 0.05 MG/ML IJ SOLN
INTRAMUSCULAR | Status: AC
  Filled 2012-12-29: qty 2

## 2012-12-29 MED ORDER — PEG 3350-KCL-NA BICARB-NACL 420 G PO SOLR
4000.0000 mL | Freq: Once | ORAL | Status: AC
  Administered 2012-12-30: 4000 mL via ORAL
  Filled 2012-12-29 (×2): qty 4000

## 2012-12-29 MED ORDER — MIDAZOLAM HCL 5 MG/ML IJ SOLN
INTRAMUSCULAR | Status: AC
  Filled 2012-12-29: qty 2

## 2012-12-29 MED ORDER — BUTAMBEN-TETRACAINE-BENZOCAINE 2-2-14 % EX AERO
INHALATION_SPRAY | CUTANEOUS | Status: DC | PRN
  Administered 2012-12-29: 2 via TOPICAL

## 2012-12-29 MED ORDER — SODIUM CHLORIDE 0.9 % IV SOLN: INTRAVENOUS | Status: DC

## 2012-12-29 MED ORDER — RENA-VITE PO TABS
1.0000 | ORAL_TABLET | Freq: Every day | ORAL | Status: DC
  Administered 2012-12-29 – 2012-12-30 (×2): 1 via ORAL
  Filled 2012-12-29 (×3): qty 1

## 2012-12-29 MED ORDER — MIDAZOLAM HCL 10 MG/2ML IJ SOLN
INTRAMUSCULAR | Status: DC | PRN
  Administered 2012-12-29 (×2): 1 mg via INTRAVENOUS
  Administered 2012-12-29: 2 mg via INTRAVENOUS

## 2012-12-29 MED ORDER — EZETIMIBE 10 MG PO TABS
10.0000 mg | ORAL_TABLET | Freq: Every day | ORAL | Status: DC
  Administered 2012-12-29 – 2012-12-30 (×2): 10 mg via ORAL
  Filled 2012-12-29 (×3): qty 1

## 2012-12-29 MED ORDER — PEG 3350-KCL-NA BICARB-NACL 420 G PO SOLR
4000.0000 mL | Freq: Once | ORAL | Status: DC
  Filled 2012-12-29: qty 4000

## 2012-12-29 MED ORDER — DARBEPOETIN ALFA-POLYSORBATE 40 MCG/0.4ML IJ SOLN
40.0000 ug | INTRAMUSCULAR | Status: DC
  Administered 2012-12-30: 40 ug via INTRAVENOUS
  Filled 2012-12-29: qty 0.4

## 2012-12-29 MED ORDER — ALBUTEROL SULFATE (5 MG/ML) 0.5% IN NEBU: 2.5000 mg | INHALATION_SOLUTION | Freq: Four times a day (QID) | RESPIRATORY_TRACT | Status: DC | PRN

## 2012-12-29 MED ORDER — FENTANYL CITRATE 0.05 MG/ML IJ SOLN
INTRAMUSCULAR | Status: DC | PRN
  Administered 2012-12-29: 25 ug via INTRAVENOUS

## 2012-12-29 MED ORDER — DIPHENHYDRAMINE HCL 50 MG/ML IJ SOLN
INTRAMUSCULAR | Status: DC | PRN
  Administered 2012-12-29: 25 mg via INTRAVENOUS

## 2012-12-29 NOTE — Consult Note (Signed)
[name] is an 72 y.o. female referred by Dr Renaldo Reel   Chief Complaint: ESRD, Anemia HPI: 72yo WF admitted last night for 2-3 day hx of bloody stools.  She was told by HD unit to go to ER when she mentioned this at HD yest.  She dialyzes [facility]THSAT at [facility] for 4 hrs, BFR 400, DW 78kg, EPO 4400units.  Hg 10.1 yest and 10.6 today.  EGD showed small duodenal erosion of ? Significance.  To go for colonoscopy on Friday.  Past Medical History  Diagnosis Date  . Carotid artery occlusion     bilat CEA in 2012?  . C. 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 Hypertension   . Atrial fibrillation April  2013  . Pulmonary hypertension   . Secondary hyperparathyroidism, renal   . COPD (chronic obstructive pulmonary disease)   . Hypercholesterolemia   . History of MI (myocardial infarction)     "they say I had 2 years ago" (08/20/2012)  . GERD (gastroesophageal reflux disease)   . History of pneumonia     "have it alot" (08/20/2012)  . Sleep apnea     "don't wear mask anymore"  . Type II diabetes mellitus   . History of blood transfusion 2013    "3 times so far in 2013:  4 pints one time; 2 pints another; ?# last time" (08/20/2012)  . Iron deficiency anemia   . Seizures 2012    after carotid surgery  . ESRD on dialysis     "[place]; Tues, Thurs; Sat"  . Renal cell carcinoma 2005?  Marland Kitchen On home oxygen therapy     "24/7"   . Acute diverticulitis     Oct 2013, treated medically [facility], sigmoid diverticulitits    Past Surgical History  Procedure Laterality Date  . Nephrectomy  2005    Right, for Renal cell carcinoma  . Carotid endarterectomy  2012    bilaterally  . Av fistula placement  Jan. 7, 2011    Right  upper arm by Dr. Hart [place]  . Hd catheter  Aug. 22, 2013    [facility]  . Appendectomy      childhood  . Abdominal hysterectomy  1971?  Marland Kitchen Coronary angioplasty with stent placement  1990's    "1 total"  . Coronary angioplasty   1990's  . Av fistula repair  2013    right upper arm    Family History  Problem Relation Age of Onset  . Cancer Mother     pancreatic  . Stroke Father   . Coronary artery disease Father   . Cancer Brother     Brain  . Kidney disease Brother     Kidney stones   Social History:  reports that she quit smoking about 9 years ago. Her smoking use included Cigarettes. She has a 67.5 pack-year smoking history. She has never used smokeless tobacco. She reports that she does not drink alcohol or use illicit drugs.  Allergies:  Allergies  Allergen Reactions  . Morphine And Related Nausea And Vomiting  . Lipitor (Atorvastatin) Other (See Comments)    Causes drowsiness  . Nsaids     Unknown reported by previous hospital    Medications Prior to Admission  Medication Sig Dispense Refill  . albuterol (PROVENTIL) (2.5 MG/3ML) 0.083% nebulizer solution Take 2.5 mg by nebulization every 6 (six) hours as needed. For shortness of breath      . calcium acetate (PHOSLO)  667 MG capsule Take 1,334 mg by mouth 3 (three) times daily with meals.      . carvedilol (COREG) 25 MG tablet Take 12.5 mg by mouth daily.       Marland Kitchen ezetimibe (ZETIA) 10 MG tablet Take 10 mg by mouth daily.      . fluticasone (FLOVENT HFA) 110 MCG/ACT inhaler Inhale 1 puff into the lungs 2 (two) times daily.      . furosemide (LASIX) 40 MG tablet Take 80 mg by mouth 2 (two) times daily.      Marland Kitchen glipiZIDE (GLUCOTROL) 5 MG tablet Take 5 mg by mouth daily as needed (if CBG is >150).      Marland Kitchen ipratropium (ATROVENT) 0.02 % nebulizer solution Take 500 mcg by nebulization every 6 (six) hours as needed for wheezing.      Marland Kitchen rOPINIRole (REQUIP) 1 MG tablet Take 1 mg by mouth 2 (two) times daily.         Lab Results: UA: NA   Recent Labs  12/28/12 1723 12/29/12 0407  WBC 9.6 8.3  HGB 10.2* 10.6*  HCT 33.1* 34.2*  PLT 189 185   BMET  Recent Labs  12/28/12 1723  NA 139  K 3.8  CL 97  CO2 31  GLUCOSE 106*  BUN 40*  CREATININE  4.91*  CALCIUM 8.6   LFT  Recent Labs  12/28/12 1723  PROT 6.6  ALBUMIN 3.1*  AST 16  ALT 8  ALKPHOS 94  BILITOT 0.2*   No results found.  ROS: No change in vision On chronic O2, no change in breathing No abd pain. CO occasional epigastric pain she relates to reflux No new arthritic CO No new neuropathic CO  PHYSICAL EXAM: Blood pressure 122/44, pulse 74, temperature 98.1 F (36.7 C), temperature source Oral, resp. rate 20, height 5\' 4"  (1.626 m), weight 81.5 kg (179 lb 10.8 oz), SpO2 95.00%. HEENT: PERRLA EOMI NECK:no jvd LUNGS:decreased BS throughout but clear CARDIAC:RRR wo MRG ABD:+ BS NTND EXT:no edema.  RUA AVF + bruit NEURO:CNI OX3 no asterixis  Assessment: 1. GI bleed with stable HG and neg EGD 2. HTN 3. COPD 4. Anemia on aranesp 5. DM 6. CAD, stable PLAN: 1. Meds reviewed.  Resume EPO 2. Daily CBC 3. Plan HD tomorrow wo heparin 4. For colonoscopy on Friday 5. Start rena vite   Maleak Brazzel T 12/29/2012, 4:22 PM

## 2012-12-29 NOTE — Op Note (Signed)
[name] [facility] [address] [place], [address]   ENDOSCOPY PROCEDURE REPORT  PATIENT: Jailynne, Opperman  MR#: [id number] BIRTHDATE: January 17, 1941 , 72  yrs. old GENDER: Female  ENDOSCOPIST: Vida Rigger, MD REFERRED BY:  PROCEDURE DATE:  12/29/2012 PROCEDURE:   EGD, diagnostic ASA CLASS:   Class III INDICATIONS:Anemia.  MEDICATIONS: Benadryl 25 mg IV, Fentanyl 25 mcg IV, and Versed 4 mg IV  TOPICAL ANESTHETIC:  DESCRIPTION OF PROCEDURE:   After the risks benefits and alternatives of the procedure were thoroughly explained, informed consent was obtained.  The Pentax Gastroscope [id number]  endoscope was introduced through the mouth and advanced to the third portion of the duodenum , limited by Without limitations.   The instrument was slowly withdrawn as the mucosa was fully examined.the findings are recorded below the patient tolerated the procedure well there was no signs of bleeding and no obvious complication         FINDINGS:1. No signs of bleeding 2. Minimal antritis 3. Questionable tiny second portion of the duodenal erosion questionable significance 4. Otherwise within normal limits to the third or possibly fourth part of the duodenum  COMPLICATIONS:none  ENDOSCOPIC IMPRESSION: above   RECOMMENDATIONS:clear liquid diet for colonoscopy on Friday and will use anesthesia sedationand propofol due to previous colonoscopy report by a different gastro-  M.D.who recommended itREPEAT EXAM:as needed   _______________________________ Vida Rigger, MD eSigned:  Vida Rigger, MD 12/29/2012 11:36 AM    CC:  PATIENT NAME:  [name], [name] MR#: [id number]

## 2012-12-29 NOTE — Plan of Care (Signed)
Problem: Phase I Progression Outcomes Goal: Hemodynamically stable Outcome: Progressing vss

## 2012-12-29 NOTE — Progress Notes (Signed)
[facility] Progress Note [place] TEAM 1 - Stepdown/ICU TEAM   [name] RUE:[id number] DOB: [birth year] DOA: 12/28/2012 PCP: [name], MD  Brief narrative: [name] is a 72 y.o. female pmh significant for AOCD, diverticulosis, GERD, HTN, DM, ESRD and 2/2 hyperparathyroidism; transferred from [facility] hospital due to GIB and needs for HD during admission time. Patient reports 2-3 days of black/maroon stools at home, with associated weakness and fatigue. Patient endorses some intermittent epigastric/chest pain and SOB. Denies fever, chills, nausea, vomiting, dysuria, HA's or any other complaints. Hgb drop from 11.5 to 9.5 according to records from HD center and [facility] ED. Admitted for further evaluation and treatment.      Assessment/Plan: Principal Problem:   GIB (gastrointestinal bleeding) - EGD reveals only minimal antritis and possible erosion in Duodenum - for colonoscopy with sedation protocol on Friday - on clears until then - bleeding resolved for now.   Active Problems:   Acute blood loss anemia - follow and replaced as needed    End stage renal disease - to be dialyzed tomorrow -spoke with Dr Briant Cedar today    COPD (chronic obstructive pulmonary disease) -stable    Diabetes Cont Glipizide with ow dose sliding scale for now   Code Status: full Family Communication: none Disposition Plan: follow in SDU   Consultants: GI  Procedures: EGD  Antibiotics: none  DVT prophylaxis: SCDs  HPI/Subjective: Pt feels hungry- wants more than clears- no further GI bleeding noted   Objective: Blood pressure 152/64, pulse 73, temperature 98 F (36.7 C), temperature source Oral, resp. rate 19, height 5\' 4"  (1.626 m), weight 81.5 kg (179 lb 10.8 oz), SpO2 97.00%.  Intake/Output Summary (Last 24 hours) at 12/29/12 1556 Last data filed at 12/29/12 1133  Gross per 24 hour  Intake    520 ml  Output    100 ml  Net    420 ml      Exam: General: No acute respiratory distress Lungs: Clear to auscultation bilaterally without wheezes or crackles Cardiovascular: Regular rate and rhythm without murmur gallop or rub normal S1 and S2 Abdomen: Nontender, nondistended, soft, bowel sounds positive, no rebound, no ascites, no appreciable mass Extremities: No significant cyanosis, clubbing, or edema bilateral lower extremities  Data Reviewed: Basic Metabolic Panel:  Recent Labs Lab 12/28/12 1723  NA 139  K 3.8  CL 97  CO2 31  GLUCOSE 106*  BUN 40*  CREATININE 4.91*  CALCIUM 8.6   Liver Function Tests:  Recent Labs Lab 12/28/12 1723  AST 16  ALT 8  ALKPHOS 94  BILITOT 0.2*  PROT 6.6  ALBUMIN 3.1*   No results found for this basename: LIPASE, AMYLASE,  in the last 168 hours No results found for this basename: AMMONIA,  in the last 168 hours CBC:  Recent Labs Lab 12/28/12 1723 12/29/12 0407  WBC 9.6 8.3  HGB 10.2* 10.6*  HCT 33.1* 34.2*  MCV 100.9* 101.2*  PLT 189 185   Cardiac Enzymes:  Recent Labs Lab 12/28/12 1723 12/28/12 2240 12/29/12 0407  TROPONINI <0.30 <0.30 <0.30   BNP (last 3 results)  Recent Labs  08/19/12 2020 10/19/12 1605  PROBNP 10460.0* 17356.0*   CBG:  Recent Labs Lab 12/28/12 2014 12/28/12 2219 12/29/12 0730 12/29/12 1214  GLUCAP 85 106* 87 81    Recent Results (from the past 240 hour(s))  MRSA PCR SCREENING     Status: None   Collection Time    12/28/12  3:46 PM  Result Value Range Status   MRSA by PCR NEGATIVE  NEGATIVE Final   Comment:            The GeneXpert MRSA Assay (FDA     approved for NASAL specimens     only), is one component of a     comprehensive MRSA colonization     surveillance program. It is not     intended to diagnose MRSA     infection nor to guide or     monitor treatment for     MRSA infections.     Studies:  Recent x-ray studies have been reviewed in detail by the Attending Physician  Scheduled  Meds:  Scheduled Meds: . albuterol  2.5 mg Nebulization Q6H  . calcium acetate  1,334 mg Oral TID WC  . carvedilol  3.125 mg Oral Daily  . ezetimibe  10 mg Oral Daily  . fluticasone  1 puff Inhalation BID  . furosemide  80 mg Oral BID  . glipiZIDE  5 mg Oral QAC breakfast  . insulin aspart  0-9 Units Subcutaneous TID WC  . pantoprazole  40 mg Oral BID  . [START ON 12/30/2012] polyethylene glycol-electrolytes  4,000 mL Oral Once  . rOPINIRole  1 mg Oral BID  . sodium chloride  3 mL Intravenous Q12H   Continuous Infusions:   Time spent on care of this patient: 35 min   [facility]  [facility] Office  [phone] Pager - Text Page per Loretha Stapler as per below:  On-Call/Text Page:      [email or web address]      password [email or web address]  If 7PM-7AM, please contact night-coverage [email or web address] Password [email or web address] 12/29/2012, 3:56 PM   LOS: 1 day

## 2012-12-29 NOTE — Progress Notes (Signed)
Utilization review completed.  

## 2012-12-29 NOTE — Plan of Care (Signed)
Problem: Phase I Progression Outcomes Goal: OOB as tolerated unless otherwise ordered Outcome: Progressing BRP Goal: Initial discharge plan identified Outcome: Progressing lbn Goal: Hemodynamically stable Outcome: Progressing vss

## 2012-12-29 NOTE — Consult Note (Signed)
Reason for Consult: GI bleed Referring Physician: Hospital team  Ebba A Babin is an 72 y.o. female.  HPI: Patient seen for some GI bleeding with both dark and bright red blood earlier this week and she has a history of diverticulitis and C. difficile but no other obvious GI problem and did have an uneventful colonoscopy roughly 5-6 years ago an aspirin a we are trying to get that report and was admitted for worsening anemia and we were consulted for that in the bleeding as above. She is thinking about a kidney transplant and they did want a colonoscopy prior to that surgeon and currently she is asymptomatic  Past Medical History  Diagnosis Date  . Carotid artery occlusion     bilat CEA in 2012?  . C. 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 Hypertension   . Atrial fibrillation April  2013  . Pulmonary hypertension   . Secondary hyperparathyroidism, renal   . COPD (chronic obstructive pulmonary disease)   . Hypercholesterolemia   . History of MI (myocardial infarction)     "they say I had 2 years ago" (08/20/2012)  . GERD (gastroesophageal reflux disease)   . History of pneumonia     "have it alot" (08/20/2012)  . Sleep apnea     "don't wear mask anymore"  . Type II diabetes mellitus   . History of blood transfusion 2013    "3 times so far in 2013:  4 pints one time; 2 pints another; ?# last time" (08/20/2012)  . Iron deficiency anemia   . Seizures 2012    after carotid surgery  . ESRD on dialysis     "[place]; Tues, Thurs; Sat"  . Renal cell carcinoma 2005?  Marland Kitchen On home oxygen therapy     "24/7"   . Acute diverticulitis     Oct 2013, treated medically [facility], sigmoid diverticulitits    Past Surgical History  Procedure Laterality Date  . Nephrectomy  2005    Right, for Renal cell carcinoma  . Carotid endarterectomy  2012    bilaterally  . Av fistula placement  Jan. 7, 2011    Right  upper arm by Dr. Hart Rochester  . Hd catheter  Aug. 22, 2013   [facility]  . Appendectomy      childhood  . Abdominal hysterectomy  1971?  Marland Kitchen Coronary angioplasty with stent placement  1990's    "1 total"  . Coronary angioplasty  1990's  . Av fistula repair  2013    right upper arm    Family History  Problem Relation Age of Onset  . Cancer Mother     pancreatic  . Stroke Father   . Coronary artery disease Father   . Cancer Brother     Brain  . Kidney disease Brother     Kidney stones    Social History:  reports that she quit smoking about 9 years ago. Her smoking use included Cigarettes. She has a 67.5 pack-year smoking history. She has never used smokeless tobacco. She reports that she does not drink alcohol or use illicit drugs.  Allergies:  Allergies  Allergen Reactions  . Morphine And Related Nausea And Vomiting  . Lipitor (Atorvastatin) Other (See Comments)    Causes drowsiness  . Nsaids     Unknown reported by previous hospital    Medications: I have reviewed the patient's current medications.  Results for orders placed during the hospital encounter of 12/28/12 (from the  past 48 hour(s))  MRSA PCR SCREENING     Status: None   Collection Time    12/28/12  3:46 PM      Result Value Range   MRSA by PCR NEGATIVE  NEGATIVE   Comment:            The GeneXpert MRSA Assay (FDA     approved for NASAL specimens     only), is one component of a     comprehensive MRSA colonization     surveillance program. It is not     intended to diagnose MRSA     infection nor to guide or     monitor treatment for     MRSA infections.  TYPE AND SCREEN     Status: None   Collection Time    12/28/12  5:05 PM      Result Value Range   ABO/RH(D) O POS     Antibody Screen NEG     Sample Expiration 12/31/2012    COMPREHENSIVE METABOLIC PANEL     Status: Abnormal   Collection Time    12/28/12  5:23 PM      Result Value Range   Sodium 139  135 - 145 mEq/L   Potassium 3.8  3.5 - 5.1 mEq/L   Chloride 97  96 - 112 mEq/L   CO2 31  19 -  32 mEq/L   Glucose, Bld 106 (*) 70 - 99 mg/dL   BUN 40 (*) 6 - 23 mg/dL   Creatinine, Ser 1.61 (*) 0.50 - 1.10 mg/dL   Calcium 8.6  8.4 - 09.6 mg/dL   Total Protein 6.6  6.0 - 8.3 g/dL   Albumin 3.1 (*) 3.5 - 5.2 g/dL   AST 16  0 - 37 U/L   ALT 8  0 - 35 U/L   Alkaline Phosphatase 94  39 - 117 U/L   Total Bilirubin 0.2 (*) 0.3 - 1.2 mg/dL   GFR calc non Af Amer 8 (*) >90 mL/min   GFR calc Af Amer 9 (*) >90 mL/min   Comment:            The eGFR has been calculated     using the CKD EPI equation.     This calculation has not been     validated in all clinical     situations.     eGFR's persistently     <90 mL/min signify     possible Chronic Kidney Disease.  CBC     Status: Abnormal   Collection Time    12/28/12  5:23 PM      Result Value Range   WBC 9.6  4.0 - 10.5 K/uL   RBC 3.28 (*) 3.87 - 5.11 MIL/uL   Hemoglobin 10.2 (*) 12.0 - 15.0 g/dL   HCT 04.5 (*) 40.9 - 81.1 %   MCV 100.9 (*) 78.0 - 100.0 fL   MCH 31.1  26.0 - 34.0 pg   MCHC 30.8  30.0 - 36.0 g/dL   RDW 91.4  78.2 - 95.6 %   Platelets 189  150 - 400 K/uL  HEMOGLOBIN A1C     Status: None   Collection Time    12/28/12  5:23 PM      Result Value Range   Hemoglobin A1C 5.1  <5.7 %   Comment: (NOTE)  According to the ADA Clinical Practice Recommendations for 2011, when     HbA1c is used as a screening test:      >=6.5%   Diagnostic of Diabetes Mellitus               (if abnormal result is confirmed)     5.7-6.4%   Increased risk of developing Diabetes Mellitus     References:Diagnosis and Classification of Diabetes Mellitus,Diabetes     Care,2011,34(Suppl 1):S62-S69 and Standards of Medical Care in             Diabetes - 2011,Diabetes Care,2011,34 (Suppl 1):S11-S61.   Mean Plasma Glucose 100  <117 mg/dL  TROPONIN I     Status: None   Collection Time    12/28/12  5:23 PM      Result Value Range   Troponin I <0.30  <0.30 ng/mL    Comment:            Due to the release kinetics of cTnI,     a negative result within the first hours     of the onset of symptoms does not rule out     myocardial infarction with certainty.     If myocardial infarction is still suspected,     repeat the test at appropriate intervals.  OCCULT BLOOD X 1 CARD TO LAB, STOOL     Status: Abnormal   Collection Time    12/28/12  7:13 PM      Result Value Range   Fecal Occult Bld POSITIVE (*) NEGATIVE  IRON AND TIBC     Status: Abnormal   Collection Time    12/28/12  7:15 PM      Result Value Range   Iron 45  42 - 135 ug/dL   TIBC 478 (*) 295 - 621 ug/dL   Saturation Ratios 18 (*) 20 - 55 %   UIBC 204  125 - 400 ug/dL  FERRITIN     Status: Abnormal   Collection Time    12/28/12  7:15 PM      Result Value Range   Ferritin 579 (*) 10 - 291 ng/mL  GLUCOSE, CAPILLARY     Status: None   Collection Time    12/28/12  8:14 PM      Result Value Range   Glucose-Capillary 85  70 - 99 mg/dL   Comment 1 Notify RN    GLUCOSE, CAPILLARY     Status: Abnormal   Collection Time    12/28/12 10:19 PM      Result Value Range   Glucose-Capillary 106 (*) 70 - 99 mg/dL   Comment 1 Notify RN    TROPONIN I     Status: None   Collection Time    12/28/12 10:40 PM      Result Value Range   Troponin I <0.30  <0.30 ng/mL   Comment:            Due to the release kinetics of cTnI,     a negative result within the first hours     of the onset of symptoms does not rule out     myocardial infarction with certainty.     If myocardial infarction is still suspected,     repeat the test at appropriate intervals.  CBC     Status: Abnormal   Collection Time    12/29/12  4:07 AM      Result Value Range   WBC 8.3  4.0 - 10.5  K/uL   RBC 3.38 (*) 3.87 - 5.11 MIL/uL   Hemoglobin 10.6 (*) 12.0 - 15.0 g/dL   HCT 16.1 (*) 09.6 - 04.5 %   MCV 101.2 (*) 78.0 - 100.0 fL   MCH 31.4  26.0 - 34.0 pg   MCHC 31.0  30.0 - 36.0 g/dL   RDW 40.9 (*) 81.1 - 91.4 %   Platelets  185  150 - 400 K/uL  TROPONIN I     Status: None   Collection Time    12/29/12  4:07 AM      Result Value Range   Troponin I <0.30  <0.30 ng/mL   Comment:            Due to the release kinetics of cTnI,     a negative result within the first hours     of the onset of symptoms does not rule out     myocardial infarction with certainty.     If myocardial infarction is still suspected,     repeat the test at appropriate intervals.  GLUCOSE, CAPILLARY     Status: None   Collection Time    12/29/12  7:30 AM      Result Value Range   Glucose-Capillary 87  70 - 99 mg/dL   Comment 1 Documented in Chart     Comment 2 Notify RN      No results found.  ROS negative except above Blood pressure 128/55, pulse 79, temperature 98 F (36.7 C), temperature source Oral, resp. rate 20, height 5\' 4"  (1.626 m), weight 81.5 kg (179 lb 10.8 oz), SpO2 97.00%. Physical Exam vital signs stable afebrile no acute distress lungs are clear heart regular rate and rhythm abdomen is soft nontender labs reviewed  Assessment/Plan: Multiple medical problems in a patient with subacute GI bleeding and history of diverticuli Plan: The risks benefits methods of endoscopy was discussed and will proceed later today with further workup and plans pending those findings and consider inpatient versus outpatient colonoscopy if needed which was also discussed Keelia Graybill E 12/29/2012, 8:56 AM

## 2012-12-30 ENCOUNTER — Encounter (HOSPITAL_COMMUNITY): Payer: Self-pay | Admitting: Gastroenterology

## 2012-12-30 LAB — GLUCOSE, CAPILLARY: Glucose-Capillary: 71 mg/dL (ref 70–99)

## 2012-12-30 LAB — CBC
HCT: 34.1 % — ABNORMAL LOW (ref 36.0–46.0)
MCH: 30.9 pg (ref 26.0–34.0)
MCHC: 30.8 g/dL (ref 30.0–36.0)
MCV: 100.3 fL — ABNORMAL HIGH (ref 78.0–100.0)
RDW: 15.4 % (ref 11.5–15.5)

## 2012-12-30 LAB — RENAL FUNCTION PANEL
Albumin: 3.2 g/dL — ABNORMAL LOW (ref 3.5–5.2)
BUN: 54 mg/dL — ABNORMAL HIGH (ref 6–23)
Chloride: 93 mEq/L — ABNORMAL LOW (ref 96–112)
GFR calc non Af Amer: 5 mL/min — ABNORMAL LOW (ref >90)
Potassium: 4.9 mEq/L (ref 3.5–5.1)

## 2012-12-30 MED ORDER — DARBEPOETIN ALFA-POLYSORBATE 40 MCG/0.4ML IJ SOLN
INTRAMUSCULAR | Status: AC
  Filled 2012-12-30: qty 0.4

## 2012-12-30 NOTE — Progress Notes (Signed)
[facility] Progress Note  TEAM 1 - Stepdown/ICU TEAM   [name] ZOX:[id number] DOB: 1940/11/21 DOA: 12/28/2012 PCP: [name], MD  Brief narrative: [name] is a 72 y.o. female pmh significant for AOCD, diverticulosis, GERD, HTN, DM, ESRD and 2/2 hyperparathyroidism; transferred from [facility] hospital due to GIB and needs for HD during admission time. Patient reports 2-3 days of black/maroon stools at home, with associated weakness and fatigue. Patient endorses some intermittent epigastric/chest pain and SOB. Denies fever, chills, nausea, vomiting, dysuria, HA's or any other complaints. Hgb drop from 11.5 to 9.5 according to records from HD center and [facility] ED. Admitted for further evaluation and treatment.      Assessment/Plan: Principal Problem:   GIB (gastrointestinal bleeding) - EGD reveals only minimal antritis and possible erosion in Duodenum - for colonoscopy with sedation protocol tomorrow - on clears until then - bleeding resolved for now.   Active Problems:   Acute blood loss anemia - follow and replaced as needed    End stage renal disease -  dialyzed today    COPD (chronic obstructive pulmonary disease) -stable    Diabetes -Hold Glipizide for tomorrow -cont with low dose sliding scale for now   Code Status: full Family Communication: none Disposition Plan: transfer to med/surg  Consultants: GI  Procedures: EGD  Antibiotics: none  DVT prophylaxis: SCDs  HPI/Subjective: No complaints today- no further blood in stool   Objective: Blood pressure 166/45, pulse 80, temperature 98.6 F (37 C), temperature source Oral, resp. rate 20, height 5\' 4"  (1.626 m), weight 77.883 kg (171 lb 11.2 oz), SpO2 97.00%.  Intake/Output Summary (Last 24 hours) at 12/30/12 2130 Last data filed at 12/30/12 1700  Gross per 24 hour  Intake    600 ml  Output   2300 ml  Net  -1700 ml     Exam: General: No acute respiratory  distress Lungs: Clear to auscultation bilaterally without wheezes or crackles Cardiovascular: Regular rate and rhythm without murmur gallop or rub normal S1 and S2 Abdomen: Nontender, nondistended, soft, bowel sounds positive, no rebound, no ascites, no appreciable mass Extremities: No significant cyanosis, clubbing, or edema bilateral lower extremities  Data Reviewed: Basic Metabolic Panel:  Recent Labs Lab 12/28/12 1723 12/30/12 0625  NA 139 132*  K 3.8 4.9  CL 97 93*  CO2 31 26  GLUCOSE 106* 90  BUN 40* 54*  CREATININE 4.91* 7.31*  CALCIUM 8.6 9.1  PHOS  --  5.9*   Liver Function Tests:  Recent Labs Lab 12/28/12 1723 12/30/12 0625  AST 16  --   ALT 8  --   ALKPHOS 94  --   BILITOT 0.2*  --   PROT 6.6  --   ALBUMIN 3.1* 3.2*   No results found for this basename: LIPASE, AMYLASE,  in the last 168 hours No results found for this basename: AMMONIA,  in the last 168 hours CBC:  Recent Labs Lab 12/28/12 1723 12/29/12 0407 12/29/12 1648 12/30/12 0445  WBC 9.6 8.3 8.4 8.0  HGB 10.2* 10.6* 10.6* 10.5*  HCT 33.1* 34.2* 34.4* 34.1*  MCV 100.9* 101.2* 100.9* 100.3*  PLT 189 185 194 181   Cardiac Enzymes:  Recent Labs Lab 12/28/12 1723 12/28/12 2240 12/29/12 0407  TROPONINI <0.30 <0.30 <0.30   BNP (last 3 results)  Recent Labs  08/19/12 2020 10/19/12 1605  PROBNP 10460.0* 17356.0*   CBG:  Recent Labs Lab 12/29/12 1214 12/29/12 1652 12/29/12 2142 12/30/12 1226 12/30/12 1701  GLUCAP 81 83 96 71 86    Recent Results (from the past 240 hour(s))  MRSA PCR SCREENING     Status: None   Collection Time    12/28/12  3:46 PM      Result Value Range Status   MRSA by PCR NEGATIVE  NEGATIVE Final   Comment:            The GeneXpert MRSA Assay (FDA     approved for NASAL specimens     only), is one component of a     comprehensive MRSA colonization     surveillance program. It is not     intended to diagnose MRSA     infection nor to guide or      monitor treatment for     MRSA infections.     Studies:  Recent x-ray studies have been reviewed in detail by the Attending Physician  Scheduled Meds:  Scheduled Meds: . calcium acetate  1,334 mg Oral TID WC  . carvedilol  3.125 mg Oral Daily  . darbepoetin (ARANESP) injection - DIALYSIS  40 mcg Intravenous Q Thu-HD  . ezetimibe  10 mg Oral Daily  . fluticasone  1 puff Inhalation BID  . furosemide  80 mg Oral BID  . insulin aspart  0-9 Units Subcutaneous TID WC  . multivitamin  1 tablet Oral Daily  . pantoprazole  40 mg Oral BID  . rOPINIRole  1 mg Oral BID  . sodium chloride  3 mL Intravenous Q12H   Continuous Infusions:   Time spent on care of this patient: 25 min   [facility]  [facility] Office  [phone] Pager - Text Page per Loretha Stapler as per below:  On-Call/Text Page:      [email or web address]      password [email or web address]  If 7PM-7AM, please contact night-coverage [email or web address] Password [email or web address] 12/30/2012, 9:30 PM   LOS: 2 days

## 2012-12-30 NOTE — Progress Notes (Signed)
[name] 2:14 PM  Subjective: Patient without any new complaints done dialysis but has not yet started her prep but no problems from her endoscopy yesterday  Objective: Vital signs stable afebrile patient not examined today will be tomorrow prior to her procedure  Assessment: Guaiac positive anemia  Plan: Hopefully she will be able to drink her prep we can proceed with her colonoscopy tomorrow and I discussed her case with the renal team as well as her current nurse to encourage her to begin the prep  [facility] E

## 2012-12-30 NOTE — Progress Notes (Signed)
Pt admitted to the unit from 3300. Pt alert and oriented x4. Ambulatory with stand by assist. Pt lives alone. No skin break down noted. Pt denies any discomfort. Vs stable. Will cont to monitor.

## 2012-12-30 NOTE — Progress Notes (Signed)
Report called to [facility], RN on 6700.  Pt transferred to 6704 via wheelchair with all belongings. Pt called family with new room number.  [facility] aware of transport needed in am to [place].   Roselie Awkward, RN

## 2012-12-30 NOTE — Procedures (Signed)
Pt seen on HD.  Ap 120 Vp 250  BFR 400.  Tolerating HD well.  For colonoscopy in AM

## 2012-12-31 ENCOUNTER — Encounter (HOSPITAL_COMMUNITY): Payer: Self-pay | Admitting: *Deleted

## 2012-12-31 ENCOUNTER — Inpatient Hospital Stay (HOSPITAL_COMMUNITY): Payer: Medicare Other | Admitting: Anesthesiology

## 2012-12-31 ENCOUNTER — Encounter (HOSPITAL_COMMUNITY): Payer: Self-pay | Admitting: Anesthesiology

## 2012-12-31 ENCOUNTER — Encounter (HOSPITAL_COMMUNITY)
Admission: AD | Disposition: A | Discharge status: Home / Self Care | Payer: Self-pay | Source: Other Acute Inpatient Hospital | Attending: Internal Medicine

## 2012-12-31 HISTORY — PX: COLONOSCOPY WITH PROPOFOL: SHX5780

## 2012-12-31 LAB — GLUCOSE, CAPILLARY: Glucose-Capillary: 88 mg/dL (ref 70–99)

## 2012-12-31 SURGERY — COLONOSCOPY WITH PROPOFOL
Anesthesia: Monitor Anesthesia Care

## 2012-12-31 MED ORDER — PANTOPRAZOLE SODIUM 40 MG PO TBEC: 40.0000 mg | DELAYED_RELEASE_TABLET | Freq: Two times a day (BID) | ORAL | Status: DC

## 2012-12-31 MED ORDER — PROPOFOL INFUSION 10 MG/ML OPTIME
INTRAVENOUS | Status: DC | PRN
  Administered 2012-12-31: 180 ug/kg/min via INTRAVENOUS

## 2012-12-31 MED ORDER — PROMETHAZINE HCL 25 MG/ML IJ SOLN: 6.2500 mg | INTRAMUSCULAR | Status: DC | PRN

## 2012-12-31 SURGICAL SUPPLY — 21 items

## 2012-12-31 NOTE — Progress Notes (Signed)
[name] 7:26 AM  Subjective: Patient doing well and tolerated her prep but did have minimal bleeding and no new complaint Objective: Vital signs stable afebrile no acute distress no CBC done this a.m. physical exam please see pre-assessment evaluation  Assessment: Guaiac positive anemia  Plan: Okay to proceed with colonoscopy this a.m. with propofol  Saahil Herbster E

## 2012-12-31 NOTE — Progress Notes (Signed)
S: O:BP 193/73  Pulse 80  Temp(Src) 98.2 F (36.8 C) (Oral)  Resp 15  Ht 5\' 4"  (1.626 m)  Wt 77.883 kg (171 lb 11.2 oz)  BMI 29.46 kg/m2  SpO2 100%  Intake/Output Summary (Last 24 hours) at 12/31/12 1040 Last data filed at 12/31/12 0748  Gross per 24 hour  Intake    940 ml  Output   2300 ml  Net  -1360 ml   Weight change: -3.6 kg (-7 lb 15 oz) Gen: CVS: Resp: Abd: Ext: NEURO:   . calcium acetate  1,334 mg Oral TID WC  . carvedilol  3.125 mg Oral Daily  . darbepoetin (ARANESP) injection - DIALYSIS  40 mcg Intravenous Q Thu-HD  . ezetimibe  10 mg Oral Daily  . fluticasone  1 puff Inhalation BID  . furosemide  80 mg Oral BID  . insulin aspart  0-9 Units Subcutaneous TID WC  . multivitamin  1 tablet Oral Daily  . pantoprazole  40 mg Oral BID  . rOPINIRole  1 mg Oral BID  . sodium chloride  3 mL Intravenous Q12H   No results found. BMET    Component Value Date/Time   NA 132* 12/30/2012 0625   K 4.9 12/30/2012 0625   CL 93* 12/30/2012 0625   CO2 26 12/30/2012 0625   GLUCOSE 90 12/30/2012 0625   BUN 54* 12/30/2012 0625   CREATININE 7.31* 12/30/2012 0625   CALCIUM 9.1 12/30/2012 0625   GFRNONAA 5* 12/30/2012 0625   GFRAA 6* 12/30/2012 0625   CBC    Component Value Date/Time   WBC 8.0 12/30/2012 0445   RBC 3.40* 12/30/2012 0445   HGB 10.5* 12/30/2012 0445   HCT 34.1* 12/30/2012 0445   PLT 181 12/30/2012 0445   MCV 100.3* 12/30/2012 0445   MCH 30.9 12/30/2012 0445   MCHC 30.8 12/30/2012 0445   RDW 15.4 12/30/2012 0445   LYMPHSABS 0.9 10/20/2012 0427   MONOABS 0.4 10/20/2012 0427   EOSABS 0.0 10/20/2012 0427   BASOSABS 0.0 10/20/2012 0427     Assessment: 1.  GI bleed ? From diverticuli.  No active bleeding now 2. HTN 3.COPD 4. Anemia on aranesp 5. DM  Plan: 1. Ok to DC from my standpoint 2. FU [facility] tomorrow.  Will contact [facility].   Toretto Tingler T

## 2012-12-31 NOTE — Preoperative (Signed)
Beta Blockers   Reason not to administer Beta Blockers:Not Applicable 

## 2012-12-31 NOTE — Op Note (Signed)
[facility] [address] [place], [address]   COLONOSCOPY PROCEDURE REPORT  PATIENT: [name], [name]  MR#: [id number] BIRTHDATE: [birth year] , 72  yrs. old GENDER: Female ENDOSCOPIST: Vida Rigger, MD REFERRED AO:[id number] Briant Cedar, M.D. PROCEDURE DATE:  12/31/2012 PROCEDURE:   Colonoscopy with biopsy ASA CLASS:   Class II INDICATIONS:Rectal Bleeding and Anemia, non-specific. MEDICATIONS: propofol (Diprivan) 420mg  IV  DESCRIPTION OF PROCEDURE:   After the risks benefits and alternatives of the procedure were thoroughly explained, informed consent was obtained.  The EC-[id number] ([id number])  endoscope was introduced through the anus and advanced to the terminal ileum which was intubated for a short distance , limited by No adverse events experienced.   The quality of the prep was adequate. . on insertion no abnormalities were seen except for left-sided and transverse diverticuli and no signs of bleeding. Advanced to the cecum did not require any position changes or any abdominal pressure The instrument was then slowly withdrawn as the colon was fully examined.other than the diverticuli mentioned above and a tiny proximal transverse polyp which was cold biopsy no signs of bleeding or other abnormalities were seen as we slowly withdrew back to the rectum once they're anal rectal pull-through and retroflexion confirmed some small hemorrhoids . Theendoscope was straightened and readvanced to shortlways up theleft-sidedof the colonair and water was suctioned scope removed patient tolerated the procedure well there was no obvious immediate unfortunately she did have increased spasm of the left side of thecolon primarily in thesigmoid and she had some difficulty holding air which makes sigmoid evaluation a little difficult but no obvious abnormalities were seen but the diverticula and we tried to compress the rectum in the customary fashion       FINDINGS:  1  internal/external hemorrhoids 2. Significant sigmoid diverticuli with a few descending and transverse diverticuli 3. Tiny proximal transverse polyp cold biopsied 4. Otherwise within normal limits to the terminal ileum without signs of bleeding COMPLICATIONS: none  IMPRESSION:  above  RECOMMENDATIONS: await pathology if adenomatous consider repeat colon screening in 5 years okay to advance diet and hopefully home later today per renal team and I be happy to see back when necessary   _______________________________ eSigned:  Vida Rigger, MD 12/31/2012 8:17 AM   BM:[id number] Briant Cedar, MD  PATIENT NAME:  Devan, Babino MR#: [id number]

## 2012-12-31 NOTE — Discharge Summary (Signed)
Physician Discharge Summary  [name] MRN: [id number] DOB/AGE: 71/30/1942 71 y.o.  PCP: [name], MD   Admit date: 12/28/2012 Discharge date: 12/31/2012  Discharge Diagnoses:      GIB (gastrointestinal bleeding) Active Problems:   End stage renal disease   Acute blood loss anemia   COPD (chronic obstructive pulmonary disease)   Weakness generalized   SOB (shortness of breath)   Diabetes   Hyperparathyroidism     Medication List    TAKE these medications       albuterol (2.5 MG/3ML) 0.083% nebulizer solution  Commonly known as:  PROVENTIL  Take 2.5 mg by nebulization every 6 (six) hours as needed. For shortness of breath     calcium acetate 667 MG capsule  Commonly known as:  PHOSLO  Take 1,334 mg by mouth 3 (three) times daily with meals.     carvedilol 25 MG tablet  Commonly known as:  COREG  Take 12.5 mg by mouth daily.     ezetimibe 10 MG tablet  Commonly known as:  ZETIA  Take 10 mg by mouth daily.     fluticasone 110 MCG/ACT inhaler  Commonly known as:  FLOVENT HFA  Inhale 1 puff into the lungs 2 (two) times daily.     furosemide 40 MG tablet  Commonly known as:  LASIX  Take 80 mg by mouth 2 (two) times daily.     glipiZIDE 5 MG tablet  Commonly known as:  GLUCOTROL  Take 5 mg by mouth daily as needed (if CBG is >150).     ipratropium 0.02 % nebulizer solution  Commonly known as:  ATROVENT  Take 500 mcg by nebulization every 6 (six) hours as needed for wheezing.     pantoprazole 40 MG tablet  Commonly known as:  PROTONIX  Take 1 tablet (40 mg total) by mouth 2 (two) times daily.     rOPINIRole 1 MG tablet  Commonly known as:  REQUIP  Take 1 mg by mouth 2 (two) times daily.        Discharge Condition: Stable   Disposition: 06-Home-Health Care Svc   Consults:  Nephrology GI   Significant Diagnostic Studies: No results found. Next    Microbiology: Recent Results (from the past 240 hour(s))  MRSA PCR SCREENING      Status: None   Collection Time    12/28/12  3:46 PM      Result Value Range Status   MRSA by PCR NEGATIVE  NEGATIVE Final   Comment:            The GeneXpert MRSA Assay (FDA     approved for NASAL specimens     only), is one component of a     comprehensive MRSA colonization     surveillance program. It is not     intended to diagnose MRSA     infection nor to guide or     monitor treatment for     MRSA infections.     Labs: Results for orders placed during the hospital encounter of 12/28/12 (from the past 48 hour(s))  GLUCOSE, CAPILLARY     Status: None   Collection Time    12/29/12 12:14 PM      Result Value Range   Glucose-Capillary 81  70 - 99 mg/dL   Comment 1 Documented in Chart     Comment 2 Notify RN    CBC     Status: Abnormal   Collection Time  12/29/12  4:48 PM      Result Value Range   WBC 8.4  4.0 - 10.5 K/uL   RBC 3.41 (*) 3.87 - 5.11 MIL/uL   Hemoglobin 10.6 (*) 12.0 - 15.0 g/dL   HCT 45.4 (*) 09.8 - 11.9 %   MCV 100.9 (*) 78.0 - 100.0 fL   MCH 31.1  26.0 - 34.0 pg   MCHC 30.8  30.0 - 36.0 g/dL   RDW 14.7  82.9 - 56.2 %   Platelets 194  150 - 400 K/uL  GLUCOSE, CAPILLARY     Status: None   Collection Time    12/29/12  4:52 PM      Result Value Range   Glucose-Capillary 83  70 - 99 mg/dL   Comment 1 Notify RN     Comment 2 Documented in Chart    GLUCOSE, CAPILLARY     Status: None   Collection Time    12/29/12  9:42 PM      Result Value Range   Glucose-Capillary 96  70 - 99 mg/dL   Comment 1 Notify RN     Comment 2 Documented in Chart    CBC     Status: Abnormal   Collection Time    12/30/12  4:45 AM      Result Value Range   WBC 8.0  4.0 - 10.5 K/uL   RBC 3.40 (*) 3.87 - 5.11 MIL/uL   Hemoglobin 10.5 (*) 12.0 - 15.0 g/dL   HCT 13.0 (*) 86.5 - 78.4 %   MCV 100.3 (*) 78.0 - 100.0 fL   MCH 30.9  26.0 - 34.0 pg   MCHC 30.8  30.0 - 36.0 g/dL   RDW 69.6  29.5 - 28.4 %   Platelets 181  150 - 400 K/uL  RENAL FUNCTION PANEL     Status:  Abnormal   Collection Time    12/30/12  6:25 AM      Result Value Range   Sodium 132 (*) 135 - 145 mEq/L   Comment: DELTA CHECK NOTED   Potassium 4.9  3.5 - 5.1 mEq/L   Comment: DELTA CHECK NOTED   Chloride 93 (*) 96 - 112 mEq/L   CO2 26  19 - 32 mEq/L   Glucose, Bld 90  70 - 99 mg/dL   BUN 54 (*) 6 - 23 mg/dL   Creatinine, Ser 1.32 (*) 0.50 - 1.10 mg/dL   Calcium 9.1  8.4 - 44.0 mg/dL   Phosphorus 5.9 (*) 2.3 - 4.6 mg/dL   Albumin 3.2 (*) 3.5 - 5.2 g/dL   GFR calc non Af Amer 5 (*) >90 mL/min   GFR calc Af Amer 6 (*) >90 mL/min   Comment:            The eGFR has been calculated     using the CKD EPI equation.     This calculation has not been     validated in all clinical     situations.     eGFR's persistently     <90 mL/min signify     possible Chronic Kidney Disease.  GLUCOSE, CAPILLARY     Status: None   Collection Time    12/30/12 12:26 PM      Result Value Range   Glucose-Capillary 71  70 - 99 mg/dL   Comment 1 Documented in Chart     Comment 2 Notify RN    GLUCOSE, CAPILLARY     Status: None   Collection  Time    12/30/12  5:01 PM      Result Value Range   Glucose-Capillary 86  70 - 99 mg/dL   Comment 1 Notify RN     Comment 2 Documented in Chart    GLUCOSE, CAPILLARY     Status: None   Collection Time    12/30/12  9:14 PM      Result Value Range   Glucose-Capillary 89  70 - 99 mg/dL  GLUCOSE, CAPILLARY     Status: None   Collection Time    12/31/12  7:20 AM      Result Value Range   Glucose-Capillary 93  70 - 99 mg/dL  GLUCOSE, CAPILLARY     Status: None   Collection Time    12/31/12  9:56 AM      Result Value Range   Glucose-Capillary 88  70 - 99 mg/dL     HPI : [name] is a 72 y.o. female pmh significant for AOCD, diverticulosis, GERD, HTN, DM, ESRD and 2/2 hyperparathyroidism; transferred from [facility] hospital due to GIB and needs for HD during admission time. Patient reported  2-3 days of black/maroon stools at home, with associated  weakness and fatigue. Patient endorsed some intermittent epigastric/chest pain and SOB. Denied fever, chills, nausea, vomiting, dysuria, HA's or any other complaints. Hgb drop from 11.5 to 9.5 according to records from HD center and [facility] ED. Admitted for further evaluation and treatment.    HOSPITAL COURSE:  GIB (gastrointestinal bleeding)  - EGD reveals only minimal antritis and possible erosion in Duodenum    colonoscopy  Showed on internal/external hemorrhoids 2. Significant sigmoid  diverticuli with a few descending and transverse diverticuli 3.  Tiny proximal transverse polyp cold biopsied 4. Otherwise within  normal limits to the terminal ileum without signs of bleeding On BID PPI   bleeding resolved for now.  Hg stable for 3 days around around 10.5   await pathology if adenomatous consider repeat  colon screening in 5 years  okay to advance diet and home today Per GI   Acute blood loss anemia  Stable  End stage renal disease  - dialyzed/ESRD on dialysis  "Trail; Tues, Thurs; Sat"  COPD (chronic obstructive pulmonary disease)  -stable   Diabetes  Resume glipizide  Code Status: full   Discharge Exam:  Blood pressure 193/73, pulse 80, temperature 98.2 F (36.8 C), temperature source Oral, resp. rate 15, height 5\' 4"  (1.626 m), weight 77.883 kg (171 lb 11.2 oz), SpO2 100.00%.   General: No acute respiratory distress  Lungs: Clear to auscultation bilaterally without wheezes or crackles  Cardiovascular: Regular rate and rhythm without murmur gallop or rub normal S1 and S2  Abdomen: Nontender, nondistended, soft, bowel sounds positive, no rebound, no ascites, no appreciable mass  Extremities: No significant cyanosis, clubbing, or edema bilateral lower extremities       Discharge Orders   Future Appointments Provider Department Dept Phone   08/30/2013 2:30 PM [facility] [phone]   08/30/2013 3:40 PM Evern Bio, NP [facility] [phone]   Future Orders Complete By Expires     Diet - low sodium heart healthy  As directed     Increase activity slowly  As directed          Signed: [facility] 12/31/2012, 10:55 AM

## 2012-12-31 NOTE — Transfer of Care (Signed)
Immediate Anesthesia Transfer of Care Note  Patient: [name]  Procedure(s) Performed: Procedure(s) with comments: COLONOSCOPY WITH PROPOFOL (N/A) - currently IP at [facility] 3307  Patient Location: PACU and Endoscopy Unit  Anesthesia Type:MAC  Level of Consciousness: awake, alert , oriented and patient cooperative  Airway & Oxygen Therapy: Patient Spontanous Breathing and Patient connected to nasal cannula oxygen  Post-op Assessment: Report given to PACU RN and Post -op Vital signs reviewed and stable  Post vital signs: Reviewed and stable  Complications: No apparent anesthesia complications

## 2012-12-31 NOTE — Anesthesia Preprocedure Evaluation (Signed)
Anesthesia Evaluation  Patient identified by MRN, date of birth, ID band Patient awake    Reviewed: Allergy & Precautions, H&P , NPO status , Patient's Chart, lab work & pertinent test results  Airway Mallampati: II TM Distance: >3 FB Neck ROM: Full    Dental no notable dental hx.    Pulmonary COPDformer smoker,    + decreased breath sounds      Cardiovascular hypertension, + dysrhythmias Atrial Fibrillation Rhythm:Irregular Rate:Normal     Neuro/Psych negative neurological ROS  negative psych ROS   GI/Hepatic negative GI ROS, Neg liver ROS,   Endo/Other  diabetes  Renal/GU ESRF and DialysisRenal disease  negative genitourinary   Musculoskeletal negative musculoskeletal ROS (+)   Abdominal   Peds negative pediatric ROS (+)  Hematology negative hematology ROS (+)   Anesthesia Other Findings   Reproductive/Obstetrics negative OB ROS                           Anesthesia Physical Anesthesia Plan  ASA: III  Anesthesia Plan: MAC   Post-op Pain Management:    Induction: Intravenous  Airway Management Planned: Nasal Cannula  Additional Equipment:   Intra-op Plan:   Post-operative Plan:   Informed Consent: I have reviewed the patients History and Physical, chart, labs and discussed the procedure including the risks, benefits and alternatives for the proposed anesthesia with the patient or authorized representative who has indicated his/her understanding and acceptance.   Dental advisory given  Plan Discussed with: CRNA and Surgeon  Anesthesia Plan Comments:         Anesthesia Quick Evaluation

## 2012-12-31 NOTE — Anesthesia Postprocedure Evaluation (Signed)
  Anesthesia Post-op Note  Patient: [name]  Procedure(s) Performed: Procedure(s) (LRB): COLONOSCOPY WITH PROPOFOL (N/A)  Patient Location: PACU  Anesthesia Type: MAC  Level of Consciousness: awake and alert   Airway and Oxygen Therapy: Patient Spontanous Breathing  Post-op Pain: mild  Post-op Assessment: Post-op Vital signs reviewed, Patient's Cardiovascular Status Stable, Respiratory Function Stable, Patent Airway and No signs of Nausea or vomiting  Last Vitals:  Filed Vitals:   12/31/12 0723  BP:   Pulse:   Temp: 37 C  Resp:     Post-op Vital Signs: stable   Complications: No apparent anesthesia complications

## 2013-01-03 ENCOUNTER — Encounter (HOSPITAL_COMMUNITY): Payer: Self-pay | Admitting: Gastroenterology

## 2013-04-13 ENCOUNTER — Encounter (HOSPITAL_COMMUNITY): Payer: Self-pay | Admitting: *Deleted

## 2013-04-13 ENCOUNTER — Emergency Department (HOSPITAL_COMMUNITY): Payer: Medicare Other

## 2013-04-13 ENCOUNTER — Emergency Department (HOSPITAL_COMMUNITY)
Admission: EM | Admit: 2013-04-13 | Discharge: 2013-04-13 | Disposition: A | Discharge status: Home / Self Care | Payer: Medicare Other | Attending: Emergency Medicine | Admitting: Emergency Medicine

## 2013-04-13 DIAGNOSIS — I5023 Acute on chronic systolic (congestive) heart failure: Secondary | ICD-10-CM

## 2013-04-13 DIAGNOSIS — R0989 Other specified symptoms and signs involving the circulatory and respiratory systems: Secondary | ICD-10-CM | POA: Insufficient documentation

## 2013-04-13 DIAGNOSIS — Z862 Personal history of diseases of the blood and blood-forming organs and certain disorders involving the immune mechanism: Secondary | ICD-10-CM | POA: Insufficient documentation

## 2013-04-13 DIAGNOSIS — R0789 Other chest pain: Secondary | ICD-10-CM | POA: Insufficient documentation

## 2013-04-13 DIAGNOSIS — J449 Chronic obstructive pulmonary disease, unspecified: Secondary | ICD-10-CM | POA: Insufficient documentation

## 2013-04-13 DIAGNOSIS — E119 Type 2 diabetes mellitus without complications: Secondary | ICD-10-CM | POA: Insufficient documentation

## 2013-04-13 DIAGNOSIS — Z87891 Personal history of nicotine dependence: Secondary | ICD-10-CM | POA: Insufficient documentation

## 2013-04-13 DIAGNOSIS — R0609 Other forms of dyspnea: Secondary | ICD-10-CM | POA: Insufficient documentation

## 2013-04-13 DIAGNOSIS — Z8619 Personal history of other infectious and parasitic diseases: Secondary | ICD-10-CM | POA: Insufficient documentation

## 2013-04-13 DIAGNOSIS — R5381 Other malaise: Secondary | ICD-10-CM | POA: Insufficient documentation

## 2013-04-13 DIAGNOSIS — Z8719 Personal history of other diseases of the digestive system: Secondary | ICD-10-CM | POA: Insufficient documentation

## 2013-04-13 DIAGNOSIS — Z8669 Personal history of other diseases of the nervous system and sense organs: Secondary | ICD-10-CM | POA: Insufficient documentation

## 2013-04-13 DIAGNOSIS — Z8679 Personal history of other diseases of the circulatory system: Secondary | ICD-10-CM | POA: Insufficient documentation

## 2013-04-13 DIAGNOSIS — I120 Hypertensive chronic kidney disease with stage 5 chronic kidney disease or end stage renal disease: Secondary | ICD-10-CM | POA: Insufficient documentation

## 2013-04-13 DIAGNOSIS — N186 End stage renal disease: Secondary | ICD-10-CM | POA: Insufficient documentation

## 2013-04-13 DIAGNOSIS — IMO0002 Reserved for concepts with insufficient information to code with codable children: Secondary | ICD-10-CM | POA: Insufficient documentation

## 2013-04-13 DIAGNOSIS — Z8701 Personal history of pneumonia (recurrent): Secondary | ICD-10-CM | POA: Insufficient documentation

## 2013-04-13 DIAGNOSIS — R0601 Orthopnea: Secondary | ICD-10-CM | POA: Insufficient documentation

## 2013-04-13 DIAGNOSIS — R5383 Other fatigue: Secondary | ICD-10-CM | POA: Insufficient documentation

## 2013-04-13 DIAGNOSIS — Z79899 Other long term (current) drug therapy: Secondary | ICD-10-CM | POA: Insufficient documentation

## 2013-04-13 DIAGNOSIS — J4489 Other specified chronic obstructive pulmonary disease: Secondary | ICD-10-CM | POA: Insufficient documentation

## 2013-04-13 DIAGNOSIS — I252 Old myocardial infarction: Secondary | ICD-10-CM | POA: Insufficient documentation

## 2013-04-13 DIAGNOSIS — Z8639 Personal history of other endocrine, nutritional and metabolic disease: Secondary | ICD-10-CM | POA: Insufficient documentation

## 2013-04-13 DIAGNOSIS — Z85828 Personal history of other malignant neoplasm of skin: Secondary | ICD-10-CM | POA: Insufficient documentation

## 2013-04-13 DIAGNOSIS — Z992 Dependence on renal dialysis: Secondary | ICD-10-CM | POA: Insufficient documentation

## 2013-04-13 LAB — POCT I-STAT TROPONIN I: Troponin i, poc: 0.05 ng/mL (ref 0.00–0.08)

## 2013-04-13 LAB — HEPATIC FUNCTION PANEL
Alkaline Phosphatase: 105 U/L (ref 39–117)
Total Bilirubin: 0.4 mg/dL (ref 0.3–1.2)

## 2013-04-13 LAB — CBC
HCT: 34.5 % — ABNORMAL LOW (ref 36.0–46.0)
MCHC: 30.1 g/dL (ref 30.0–36.0)
MCV: 105.5 fL — ABNORMAL HIGH (ref 78.0–100.0)
RDW: 14.9 % (ref 11.5–15.5)

## 2013-04-13 LAB — BASIC METABOLIC PANEL
BUN: 19 mg/dL (ref 6–23)
Calcium: 9.2 mg/dL (ref 8.4–10.5)
Creatinine, Ser: 3.34 mg/dL — ABNORMAL HIGH (ref 0.50–1.10)
GFR calc Af Amer: 15 mL/min — ABNORMAL LOW (ref >90)
GFR calc non Af Amer: 13 mL/min — ABNORMAL LOW (ref >90)

## 2013-04-13 MED ORDER — FUROSEMIDE 10 MG/ML IJ SOLN
40.0000 mg | Freq: Once | INTRAMUSCULAR | Status: AC
  Administered 2013-04-13: 40 mg via INTRAVENOUS
  Filled 2013-04-13: qty 4

## 2013-04-13 NOTE — ED Provider Notes (Signed)
History    CSN: [id number] Arrival date & time 04/13/13  1633  First MD Initiated Contact with Patient 04/13/13 1653     Chief Complaint  Patient presents with  . Shortness of Breath   (Consider location/radiation/quality/duration/timing/severity/associated sxs/prior Treatment) HPI Comments: Pt reports several days of worsening dyspnea and orthopnea.  She reports having several episodes of this previously and that it is usually d/t "fluid in my lungs."  She denies any fevers, cough, LE edema, or calf pain.  She denies chest pain.  She states exertion makes the dyspnea worse.  Rest makes it better.  She states that she went to her PCP who was concerned for possible pulmonary edema based on exam and decreased SaO2, so she was sent to the ED for further eval.  Pt states she usually uses 3L O2 [place], but that her SaO2 was in upper 80s on exertion today.  Patient is a 72 y.o. female presenting with shortness of breath.  Shortness of Breath Associated symptoms: no chest pain, no cough, no diaphoresis, no fever and no wheezing    Past Medical History  Diagnosis Date  . Carotid artery occlusion     bilat CEA in 2012?  . C. 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 Hypertension   . Atrial fibrillation April  2013  . Pulmonary hypertension   . Secondary hyperparathyroidism, renal   . COPD (chronic obstructive pulmonary disease)   . Hypercholesterolemia   . History of MI (myocardial infarction)     "they say I had 2 years ago" (08/20/2012)  . GERD (gastroesophageal reflux disease)   . History of pneumonia     "have it alot" (08/20/2012)  . Sleep apnea     "don't wear mask anymore"  . Type II diabetes mellitus   . History of blood transfusion 2013    "3 times so far in 2013:  4 pints one time; 2 pints another; ?# last time" (08/20/2012)  . Iron deficiency anemia   . Seizures 2012    after carotid surgery  . ESRD on dialysis     "[place]; Tues, Thurs; Sat"  . Renal  cell carcinoma 2005?  Marland Kitchen On home oxygen therapy     "24/7"   . Acute diverticulitis     Oct 2013, treated medically [facility], sigmoid diverticulitits  . CHF (congestive heart failure)    Past Surgical History  Procedure Laterality Date  . Nephrectomy  2005    Right, for Renal cell carcinoma  . Carotid endarterectomy  2012    bilaterally  . Av fistula placement  Jan. 7, 2011    Right  upper arm by Dr. Hart Rochester  . Hd catheter  Aug. 22, 2013    [facility]  . Appendectomy      childhood  . Abdominal hysterectomy  1971?  Marland Kitchen Coronary angioplasty with stent placement  1990's    "1 total"  . Coronary angioplasty  1990's  . Av fistula repair  2013    right upper arm  . Esophagogastroduodenoscopy N/A 12/29/2012    Procedure: ESOPHAGOGASTRODUODENOSCOPY (EGD);  Surgeon: Petra Kuba, MD;  Location: [facility] ENDOSCOPY;  Service: Endoscopy;  Laterality: N/A;  . Colonoscopy with propofol N/A 12/31/2012    Procedure: COLONOSCOPY WITH PROPOFOL;  Surgeon: Petra Kuba, MD;  Location: [facility] ENDOSCOPY;  Service: Endoscopy;  Laterality: N/A;  currently IP at [facility] 3307   Family History  Problem Relation Age of Onset  . Cancer  Mother     pancreatic  . Stroke Father   . Coronary artery disease Father   . Cancer Brother     Brain  . Kidney disease Brother     Kidney stones   History  Substance Use Topics  . Smoking status: Former Smoker -- 1.50 packs/day for 45 years    Types: Cigarettes    Quit date: 09/30/2003  . Smokeless tobacco: Never Used  . Alcohol Use: No   OB History   Grav Para Term Preterm Abortions TAB SAB Ect Mult Living                 Review of Systems  Constitutional: Positive for fatigue. Negative for fever, chills, diaphoresis, activity change and appetite change.  HENT: Negative.   Eyes: Negative.   Respiratory: Positive for chest tightness and shortness of breath. Negative for cough, wheezing and stridor.   Cardiovascular: Negative for chest pain, palpitations and  leg swelling.  Gastrointestinal: Negative for abdominal distention.  Endocrine: Negative.   Genitourinary: Negative.   Neurological: Negative.   Psychiatric/Behavioral: Negative.   All other systems reviewed and are negative.    Allergies  Morphine and related; Lipitor; and Nsaids  Home Medications   Current Outpatient Rx  Name  Route  Sig  Dispense  Refill  . albuterol (PROVENTIL) (2.5 MG/3ML) 0.083% nebulizer solution   Nebulization   Take 2.5 mg by nebulization every 6 (six) hours as needed. For shortness of breath         . calcium acetate (PHOSLO) 667 MG capsule   Oral   Take 1,334 mg by mouth 3 (three) times daily with meals.         . calcium carbonate (TUMS - DOSED IN MG ELEMENTAL CALCIUM) 500 MG chewable tablet   Oral   Chew 2 tablets by mouth 3 (three) times daily as needed for heartburn. For heartburn         . carvedilol (COREG) 25 MG tablet   Oral   Take 12.5 mg by mouth daily.          Marland Kitchen ezetimibe (ZETIA) 10 MG tablet   Oral   Take 10 mg by mouth at bedtime.          . fluticasone (FLOVENT HFA) 110 MCG/ACT inhaler   Inhalation   Inhale 1 puff into the lungs 2 (two) times daily.         Marland Kitchen ipratropium (ATROVENT) 0.02 % nebulizer solution   Nebulization   Take 500 mcg by nebulization every 6 (six) hours as needed for wheezing.         Marland Kitchen rOPINIRole (REQUIP) 1 MG tablet   Oral   Take 1 mg by mouth 2 (two) times daily.         Marland Kitchen glipiZIDE (GLUCOTROL) 5 MG tablet   Oral   Take 5 mg by mouth daily as needed (if CBG is >150).          BP 129/56  Pulse 91  Temp(Src) 98.6 F (37 C) (Oral)  Resp 21  SpO2 99% Physical Exam  Nursing note and vitals reviewed. Constitutional: She is oriented to person, place, and time. She appears well-developed and well-nourished. No distress.  HENT:  Head: Normocephalic and atraumatic.  Eyes: EOM are normal. Pupils are equal, round, and reactive to light.  Neck: Normal range of motion. Neck supple.  No JVD present.  Cardiovascular: Normal rate, regular rhythm, normal heart sounds and intact distal pulses.   Pulmonary/Chest:  No respiratory distress. She has no wheezes. She has rales in the right lower field. She exhibits no tenderness.  Abdominal: Soft. Bowel sounds are normal.  Musculoskeletal: Normal range of motion.  Neurological: She is alert and oriented to person, place, and time.  Skin: Skin is warm and dry. She is not diaphoretic.  Psychiatric: She has a normal mood and affect.    ED Course  Procedures (including critical care time) Labs Reviewed  CBC - Abnormal; Notable for the following:    WBC 12.2 (*)    RBC 3.27 (*)    Hemoglobin 10.4 (*)    HCT 34.5 (*)    MCV 105.5 (*)    All other components within normal limits  BASIC METABOLIC PANEL  PRO B NATRIURETIC PEPTIDE  TROPONIN I  HEPATIC FUNCTION PANEL  POCT I-STAT TROPONIN I   No results found. No diagnosis found.  MDM  CXR: *RADIOLOGY REPORT*  Clinical Data: Shortness of breath  CHEST - 2 VIEW  Comparison: 12/28/2012  Findings: Normal heart size. Pulmonary vascular congestion is noted. Moderate interstitial coarsening is identified bilaterally. No superimposed airspace consolidation noted.  Bones appear osteopenic. No acute osseous abnormality noted.  IMPRESSION:  1. Pulmonary vascular congestion  BNP: 12692  BP ok.  Will give IV lasix bolus here as pt still makes urine.  She had good response to this.  Meanwhile, pt is at her baseline home O2 requirement with SaO2 100%.  We had her walk around the ED and her SaO2 did not drop.  She states she feels well and wold like to go home.  I do not feel she warrants emergent dialysis.  She has close f/u and can see her PCP in am.  Will give strict return precautions.  Do not suspect PNA or PE.  Suspect etiology is acute on chronic CHF along with known ESRD leading to pulmonary edema.  I spoke to pt regarding need to f/u in am as well as d/dx.  Ashby Dawes, MD 04/13/13 [phone]

## 2013-04-13 NOTE — ED Notes (Signed)
While ambulating pt pulse ox ranged between 94% and 97% but usually stayed around 95%.  The pt's pulse rose to 112 while ambulating but was usually 105.

## 2013-04-13 NOTE — ED Notes (Signed)
EDP has updated on plan of care. Pt states she is feeling better and would like to go home. Plan of care is to ambulate patient and assess sob.

## 2013-04-13 NOTE — ED Notes (Addendum)
Reports increase in sob since Saturday, went to pcp today and sent here due to chf and fluid on her chest xray. spo2 89% at triage, wears 3L o2 at all times.  Dialysis pt, last tx was today.

## 2013-06-19 ENCOUNTER — Inpatient Hospital Stay (HOSPITAL_COMMUNITY)
Admission: AD | Admit: 2013-06-19 | Discharge: 2013-06-27 | DRG: 291 | Disposition: A | Discharge status: Home / Self Care | Payer: Medicare Other | Source: Ambulatory Visit | Attending: Internal Medicine | Admitting: Internal Medicine

## 2013-06-19 DIAGNOSIS — Z905 Acquired absence of kidney: Secondary | ICD-10-CM

## 2013-06-19 DIAGNOSIS — I5033 Acute on chronic diastolic (congestive) heart failure: Principal | ICD-10-CM | POA: Diagnosis present

## 2013-06-19 DIAGNOSIS — I4891 Unspecified atrial fibrillation: Secondary | ICD-10-CM | POA: Diagnosis present

## 2013-06-19 DIAGNOSIS — M899 Disorder of bone, unspecified: Secondary | ICD-10-CM | POA: Diagnosis present

## 2013-06-19 DIAGNOSIS — A0472 Enterocolitis due to Clostridium difficile, not specified as recurrent: Secondary | ICD-10-CM | POA: Diagnosis present

## 2013-06-19 DIAGNOSIS — I422 Other hypertrophic cardiomyopathy: Secondary | ICD-10-CM | POA: Diagnosis present

## 2013-06-19 DIAGNOSIS — J9620 Acute and chronic respiratory failure, unspecified whether with hypoxia or hypercapnia: Secondary | ICD-10-CM

## 2013-06-19 DIAGNOSIS — J9601 Acute respiratory failure with hypoxia: Secondary | ICD-10-CM

## 2013-06-19 DIAGNOSIS — I480 Paroxysmal atrial fibrillation: Secondary | ICD-10-CM

## 2013-06-19 DIAGNOSIS — E872 Acidosis, unspecified: Secondary | ICD-10-CM | POA: Diagnosis not present

## 2013-06-19 DIAGNOSIS — E662 Morbid (severe) obesity with alveolar hypoventilation: Secondary | ICD-10-CM | POA: Diagnosis present

## 2013-06-19 DIAGNOSIS — F29 Unspecified psychosis not due to a substance or known physiological condition: Secondary | ICD-10-CM | POA: Diagnosis not present

## 2013-06-19 DIAGNOSIS — R569 Unspecified convulsions: Secondary | ICD-10-CM | POA: Diagnosis present

## 2013-06-19 DIAGNOSIS — Z85528 Personal history of other malignant neoplasm of kidney: Secondary | ICD-10-CM

## 2013-06-19 DIAGNOSIS — J9600 Acute respiratory failure, unspecified whether with hypoxia or hypercapnia: Secondary | ICD-10-CM

## 2013-06-19 DIAGNOSIS — N2581 Secondary hyperparathyroidism of renal origin: Secondary | ICD-10-CM | POA: Diagnosis present

## 2013-06-19 DIAGNOSIS — Z992 Dependence on renal dialysis: Secondary | ICD-10-CM

## 2013-06-19 DIAGNOSIS — Z9861 Coronary angioplasty status: Secondary | ICD-10-CM

## 2013-06-19 DIAGNOSIS — K219 Gastro-esophageal reflux disease without esophagitis: Secondary | ICD-10-CM | POA: Diagnosis present

## 2013-06-19 DIAGNOSIS — R000 Tachycardia, unspecified: Secondary | ICD-10-CM | POA: Diagnosis not present

## 2013-06-19 DIAGNOSIS — Z91199 Patient's noncompliance with other medical treatment and regimen due to unspecified reason: Secondary | ICD-10-CM

## 2013-06-19 DIAGNOSIS — I272 Pulmonary hypertension, unspecified: Secondary | ICD-10-CM | POA: Diagnosis present

## 2013-06-19 DIAGNOSIS — E119 Type 2 diabetes mellitus without complications: Secondary | ICD-10-CM | POA: Diagnosis present

## 2013-06-19 DIAGNOSIS — G9340 Encephalopathy, unspecified: Secondary | ICD-10-CM | POA: Diagnosis present

## 2013-06-19 DIAGNOSIS — I120 Hypertensive chronic kidney disease with stage 5 chronic kidney disease or end stage renal disease: Secondary | ICD-10-CM | POA: Diagnosis present

## 2013-06-19 DIAGNOSIS — Z87891 Personal history of nicotine dependence: Secondary | ICD-10-CM

## 2013-06-19 DIAGNOSIS — R079 Chest pain, unspecified: Secondary | ICD-10-CM | POA: Diagnosis not present

## 2013-06-19 DIAGNOSIS — Z9119 Patient's noncompliance with other medical treatment and regimen: Secondary | ICD-10-CM

## 2013-06-19 DIAGNOSIS — I509 Heart failure, unspecified: Secondary | ICD-10-CM | POA: Diagnosis present

## 2013-06-19 DIAGNOSIS — J810 Acute pulmonary edema: Secondary | ICD-10-CM

## 2013-06-19 DIAGNOSIS — I252 Old myocardial infarction: Secondary | ICD-10-CM

## 2013-06-19 DIAGNOSIS — J9611 Chronic respiratory failure with hypoxia: Secondary | ICD-10-CM | POA: Diagnosis present

## 2013-06-19 DIAGNOSIS — I2789 Other specified pulmonary heart diseases: Secondary | ICD-10-CM | POA: Diagnosis present

## 2013-06-19 DIAGNOSIS — J4489 Other specified chronic obstructive pulmonary disease: Secondary | ICD-10-CM | POA: Diagnosis present

## 2013-06-19 DIAGNOSIS — I161 Hypertensive emergency: Secondary | ICD-10-CM

## 2013-06-19 DIAGNOSIS — J449 Chronic obstructive pulmonary disease, unspecified: Secondary | ICD-10-CM | POA: Diagnosis present

## 2013-06-19 DIAGNOSIS — Z9981 Dependence on supplemental oxygen: Secondary | ICD-10-CM

## 2013-06-19 DIAGNOSIS — E7800 Pure hypercholesterolemia, unspecified: Secondary | ICD-10-CM | POA: Diagnosis present

## 2013-06-19 DIAGNOSIS — I10 Essential (primary) hypertension: Secondary | ICD-10-CM | POA: Diagnosis present

## 2013-06-19 DIAGNOSIS — D509 Iron deficiency anemia, unspecified: Secondary | ICD-10-CM | POA: Diagnosis present

## 2013-06-19 DIAGNOSIS — G4733 Obstructive sleep apnea (adult) (pediatric): Secondary | ICD-10-CM | POA: Diagnosis present

## 2013-06-19 DIAGNOSIS — N186 End stage renal disease: Secondary | ICD-10-CM | POA: Diagnosis present

## 2013-06-19 LAB — COMPREHENSIVE METABOLIC PANEL
ALT: 15 U/L (ref 0–35)
BUN: 61 mg/dL — ABNORMAL HIGH (ref 6–23)
Calcium: 8.9 mg/dL (ref 8.4–10.5)
Creatinine, Ser: 7.44 mg/dL — ABNORMAL HIGH (ref 0.50–1.10)
GFR calc Af Amer: 6 mL/min — ABNORMAL LOW (ref >90)
GFR calc non Af Amer: 5 mL/min — ABNORMAL LOW (ref >90)
Glucose, Bld: 139 mg/dL — ABNORMAL HIGH (ref 70–99)
Sodium: 138 mEq/L (ref 135–145)
Total Protein: 6.7 g/dL (ref 6.0–8.3)

## 2013-06-19 LAB — MRSA PCR SCREENING: MRSA by PCR: NEGATIVE

## 2013-06-19 LAB — CBC
Hemoglobin: 11.2 g/dL — ABNORMAL LOW (ref 12.0–15.0)
MCH: 32.2 pg (ref 26.0–34.0)
MCHC: 29.9 g/dL — ABNORMAL LOW (ref 30.0–36.0)
MCV: 107.8 fL — ABNORMAL HIGH (ref 78.0–100.0)

## 2013-06-19 MED ORDER — EZETIMIBE 10 MG PO TABS
10.0000 mg | ORAL_TABLET | Freq: Every day | ORAL | Status: DC
  Administered 2013-06-19 – 2013-06-26 (×8): 10 mg via ORAL
  Filled 2013-06-19 (×9): qty 1

## 2013-06-19 MED ORDER — RIFAXIMIN 200 MG PO TABS: 400.0000 mg | ORAL_TABLET | Freq: Three times a day (TID) | ORAL | Status: DC

## 2013-06-19 MED ORDER — ALBUTEROL SULFATE (5 MG/ML) 0.5% IN NEBU
2.5000 mg | INHALATION_SOLUTION | Freq: Four times a day (QID) | RESPIRATORY_TRACT | Status: DC | PRN
  Administered 2013-06-20 – 2013-06-22 (×2): 2.5 mg via RESPIRATORY_TRACT
  Filled 2013-06-19 (×2): qty 0.5

## 2013-06-19 MED ORDER — ROPINIROLE HCL 1 MG PO TABS
1.0000 mg | ORAL_TABLET | Freq: Two times a day (BID) | ORAL | Status: DC
  Filled 2013-06-19: qty 1

## 2013-06-19 MED ORDER — CALCIUM CARBONATE ANTACID 500 MG PO CHEW
2.0000 | CHEWABLE_TABLET | Freq: Three times a day (TID) | ORAL | Status: DC | PRN
  Filled 2013-06-19: qty 2

## 2013-06-19 MED ORDER — VANCOMYCIN 50 MG/ML ORAL SOLUTION
125.0000 mg | Freq: Four times a day (QID) | ORAL | Status: DC
  Administered 2013-06-19 – 2013-06-27 (×27): 125 mg via ORAL
  Filled 2013-06-19 (×35): qty 2.5

## 2013-06-19 MED ORDER — LABETALOL HCL 5 MG/ML IV SOLN: 10.0000 mg | INTRAVENOUS | Status: DC | PRN

## 2013-06-19 MED ORDER — HEPARIN SODIUM (PORCINE) 5000 UNIT/ML IJ SOLN
5000.0000 [IU] | Freq: Three times a day (TID) | INTRAMUSCULAR | Status: DC
  Administered 2013-06-19 – 2013-06-22 (×9): 5000 [IU] via SUBCUTANEOUS
  Filled 2013-06-19 (×11): qty 1

## 2013-06-19 MED ORDER — INSULIN ASPART 100 UNIT/ML ~~LOC~~ SOLN
0.0000 [IU] | Freq: Three times a day (TID) | SUBCUTANEOUS | Status: DC
  Administered 2013-06-20: 1 [IU] via SUBCUTANEOUS
  Administered 2013-06-21: 2 [IU] via SUBCUTANEOUS
  Administered 2013-06-21: 1 [IU] via SUBCUTANEOUS
  Administered 2013-06-22: 2 [IU] via SUBCUTANEOUS

## 2013-06-19 MED ORDER — SODIUM CHLORIDE 0.9 % IJ SOLN
3.0000 mL | Freq: Two times a day (BID) | INTRAMUSCULAR | Status: DC
  Administered 2013-06-19 – 2013-06-26 (×11): 3 mL via INTRAVENOUS

## 2013-06-19 MED ORDER — IPRATROPIUM BROMIDE 0.02 % IN SOLN: 500.0000 ug | Freq: Four times a day (QID) | RESPIRATORY_TRACT | Status: DC | PRN

## 2013-06-19 MED ORDER — FLUTICASONE PROPIONATE HFA 110 MCG/ACT IN AERO
1.0000 | INHALATION_SPRAY | Freq: Two times a day (BID) | RESPIRATORY_TRACT | Status: DC
  Administered 2013-06-20 – 2013-06-26 (×11): 1 via RESPIRATORY_TRACT
  Filled 2013-06-19 (×2): qty 12

## 2013-06-19 MED ORDER — CARVEDILOL 6.25 MG PO TABS
6.2500 mg | ORAL_TABLET | Freq: Two times a day (BID) | ORAL | Status: DC
  Administered 2013-06-20 – 2013-06-22 (×5): 6.25 mg via ORAL
  Filled 2013-06-19 (×8): qty 1

## 2013-06-19 MED ORDER — NITROGLYCERIN 2 % TD OINT
0.5000 [in_us] | TOPICAL_OINTMENT | Freq: Four times a day (QID) | TRANSDERMAL | Status: DC
  Administered 2013-06-19 – 2013-06-20 (×2): 0.5 [in_us] via TOPICAL
  Filled 2013-06-19: qty 30

## 2013-06-19 MED ORDER — CALCIUM ACETATE 667 MG PO CAPS
1334.0000 mg | ORAL_CAPSULE | Freq: Three times a day (TID) | ORAL | Status: DC
  Administered 2013-06-20 – 2013-06-26 (×15): 1334 mg via ORAL
  Filled 2013-06-19 (×25): qty 2

## 2013-06-19 MED ORDER — ROPINIROLE HCL 1 MG PO TABS
1.0000 mg | ORAL_TABLET | Freq: Two times a day (BID) | ORAL | Status: DC
  Administered 2013-06-19 – 2013-06-27 (×15): 1 mg via ORAL
  Filled 2013-06-19 (×21): qty 1

## 2013-06-19 NOTE — Progress Notes (Signed)
eLink Physician-Brief Progress Note Patient Name: [name] DOB: 1941-02-28 MRN: [id number]  Date of Service  06/19/2013   HPI/Events of Note   Transfer from [facility]. Initially hypertensive emergency on Cardene gtt, acute respiratory failure on BiPAP and encephalopathic.  Upon arrival - mentation much improved, normotensive, off Cardene gtt and off BiPAP.    eICU Interventions   Asked Dr. Julian Reil [facility]) to evaluate for SDU admission.   Intervention Category Intermediate Interventions: Communication with other healthcare providers and/or family  Lonia Farber 06/19/2013, 8:30 PM

## 2013-06-19 NOTE — H&P (Addendum)
[facility] History and Physical  [name] ZOX:[id number] DOB: 12/08/1940 DOA: 06/19/2013  Referring physician: ED PCP: [name], MD    Chief Complaint: Hypertensive emergency  HPI: [name] is a 72 y.o. female who presented initially to [facility].  Unfortunately it seems they were only able to do a partial dialysis session on Friday (ESRD M/W/F).  She was hypertensive emergency in the ED there with SBPs of 260, acute respiratory failure on BIPAP, encephalopathic, and in severe pulmonary edema.  She was put on cardene gtt initially as well as given labetalol and NTG paste and transferred to [facility] here [facility].  Thankfully and suprisingly by the time she arrived here at [facility], she is off the BiPAP, off the cardene gtt, normotensive, and has normal mentation.  CCM has asked medicine to admit patient given she no longer requires ICU at this time.  Review of Systems: Patient reports recurrence of diarrhea recently with mild fever, given her h/o recurrent C.Diff stool sample earlier last week was tested by her doc who then "told her she had C.Diff again" 12 systems reviewed, negative except for diarrhea.  Past Medical History  Diagnosis Date  . Carotid artery occlusion     bilat CEA in 2012?  . C. 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 Hypertension   . Atrial fibrillation April  2013  . Pulmonary hypertension   . Secondary hyperparathyroidism, renal   . COPD (chronic obstructive pulmonary disease)   . Hypercholesterolemia   . History of MI (myocardial infarction)     "they say I had 2 years ago" (08/20/2012)  . GERD (gastroesophageal reflux disease)   . History of pneumonia     "have it alot" (08/20/2012)  . Sleep apnea     "don't wear mask anymore"  . Type II diabetes mellitus   . History of blood transfusion 2013    "3 times so far in 2013:  4 pints one time; 2 pints another; ?# last time" (08/20/2012)  . Iron deficiency anemia   .  Seizures 2012    after carotid surgery  . ESRD on dialysis     "[place]; Tues, Thurs; Sat"  . Renal cell carcinoma 2005?  Marland Kitchen On home oxygen therapy     "24/7"   . Acute diverticulitis     Oct 2013, treated medically [facility], sigmoid diverticulitits  . CHF (congestive heart failure)    Past Surgical History  Procedure Laterality Date  . Nephrectomy  2005    Right, for Renal cell carcinoma  . Carotid endarterectomy  2012    bilaterally  . Av fistula placement  Jan. 7, 2011    Right  upper arm by Dr. Hart Rochester  . Hd catheter  Aug. 22, 2013    [facility]  . Appendectomy      childhood  . Abdominal hysterectomy  1971?  Marland Kitchen Coronary angioplasty with stent placement  1990's    "1 total"  . Coronary angioplasty  1990's  . Av fistula repair  2013    right upper arm  . Esophagogastroduodenoscopy N/A 12/29/2012    Procedure: ESOPHAGOGASTRODUODENOSCOPY (EGD);  Surgeon: Petra Kuba, MD;  Location: [facility] ENDOSCOPY;  Service: Endoscopy;  Laterality: N/A;  . Colonoscopy with propofol N/A 12/31/2012    Procedure: COLONOSCOPY WITH PROPOFOL;  Surgeon: Petra Kuba, MD;  Location: [facility] ENDOSCOPY;  Service: Endoscopy;  Laterality: N/A;  currently IP at [facility] 3307   Social History:  reports that she quit smoking about 9 years ago. Her smoking use included Cigarettes. She has a 67.5 pack-year smoking history. She has never used smokeless tobacco. She reports that she does not drink alcohol or use illicit drugs.   Allergies  Allergen Reactions  . Morphine And Related Nausea And Vomiting  . Lipitor [Atorvastatin] Other (See Comments)    Causes drowsiness  . Nsaids     Unknown reported by previous hospital    Family History  Problem Relation Age of Onset  . Cancer Mother     pancreatic  . Stroke Father   . Coronary artery disease Father   . Cancer Brother     Brain  . Kidney disease Brother     Kidney stones    Prior to Admission medications   Medication Sig Start Date End Date  Taking? Authorizing Provider  albuterol (PROVENTIL) (2.5 MG/3ML) 0.083% nebulizer solution Take 2.5 mg by nebulization every 6 (six) hours as needed. For shortness of breath    Historical Provider, MD  calcium acetate (PHOSLO) 667 MG capsule Take 1,334 mg by mouth 3 (three) times daily with meals.    Historical Provider, MD  calcium carbonate (TUMS - DOSED IN MG ELEMENTAL CALCIUM) 500 MG chewable tablet Chew 2 tablets by mouth 3 (three) times daily as needed for heartburn. For heartburn    Historical Provider, MD  carvedilol (COREG) 25 MG tablet Take 12.5 mg by mouth daily.     Historical Provider, MD  ezetimibe (ZETIA) 10 MG tablet Take 10 mg by mouth at bedtime.     Historical Provider, MD  fluticasone (FLOVENT HFA) 110 MCG/ACT inhaler Inhale 1 puff into the lungs 2 (two) times daily.    Historical Provider, MD  glipiZIDE (GLUCOTROL) 5 MG tablet Take 5 mg by mouth daily as needed (if CBG is >150).    Historical Provider, MD  ipratropium (ATROVENT) 0.02 % nebulizer solution Take 500 mcg by nebulization every 6 (six) hours as needed for wheezing.    Historical Provider, MD  rOPINIRole (REQUIP) 1 MG tablet Take 1 mg by mouth 2 (two) times daily.    Historical Provider, MD   Physical Exam: Filed Vitals:   06/19/13 2007  BP:   Pulse:   Temp: 98.2 F (36.8 C)  Resp:     General:  NAD, resting comfortably in bed Eyes: PEERLA EOMI ENT: mucous membranes moist Neck: supple w/o JVD Cardiovascular: RRR w/o MRG Respiratory: few crackles B Abdomen: soft, mild tenderness worse on the R side, nd, bs+ Skin: no rash nor lesion Musculoskeletal: MAE, full ROM all 4 extremities Psychiatric: normal tone and affect Neurologic: AAOx3, grossly non-focal  Labs on Admission:  Basic Metabolic Panel: No results found for this basename: NA, K, CL, CO2, GLUCOSE, BUN, CREATININE, CALCIUM, MG, PHOS,  in the last 168 hours Liver Function Tests: No results found for this basename: AST, ALT, ALKPHOS, BILITOT,  PROT, ALBUMIN,  in the last 168 hours No results found for this basename: LIPASE, AMYLASE,  in the last 168 hours No results found for this basename: AMMONIA,  in the last 168 hours CBC: No results found for this basename: WBC, NEUTROABS, HGB, HCT, MCV, PLT,  in the last 168 hours Cardiac Enzymes: No results found for this basename: CKTOTAL, CKMB, CKMBINDEX, TROPONINI,  in the last 168 hours  BNP (last 3 results)  Recent Labs  08/19/12 2020 10/19/12 1605 04/13/13 1644  PROBNP 10460.0* 17356.0* 12692.0*   CBG:  Recent Labs Lab 06/19/13 2001  GLUCAP 113*    Radiological Exams on Admission: No results found.  EKG: Independently reviewed.  Assessment/Plan Principal Problem:   Acute pulmonary edema Active Problems:   End stage renal disease   Acute-on-chronic respiratory failure   C. difficile colitis   1. Acute pulmonary edema - due to fluid overload from partially missed dialysis session, respiratory failure resolved, HTN emergency resolved, continuing NTG paste and labetalol PRN at this time, also converting her metoprolol 12.5mg  daily to 6.25 BID due to concern for rebound hypertension possibly having contributed somewhat to this evening episode.  Off BIPAP, off cardene gtt, spoke with Dr. Lowell Guitar plan to get patient dialyzed in AM. 2. C.Diff colitis - PO vanc and rifixamin ordered, C.Diff PCR ordered but per patient and daughter it sounds as this was already positive just done a few days ago although I do not have confirmation of that via records. 3. DM2 - putting patient on sensitive dose SSI.  Spoke with Dr. Lowell Guitar, plan to dialyze patient early tomorrow.  Code Status: Full Code (must indicate code status--if unknown or must be presumed, indicate so) Family Communication: Spoke with daughter at bedside (indicate person spoken with, if applicable, with phone number if by telephone) Disposition Plan: Admit to inpatient (indicate anticipated LOS)  Time spent: 70  min  GARDNER, JARED M. [facility] Pager [phone]  If 7PM-7AM, please contact night-coverage [email or web address] Password [facility] 06/19/2013, 8:56 PM

## 2013-06-20 DIAGNOSIS — I10 Essential (primary) hypertension: Secondary | ICD-10-CM | POA: Diagnosis present

## 2013-06-20 DIAGNOSIS — I272 Pulmonary hypertension, unspecified: Secondary | ICD-10-CM | POA: Diagnosis present

## 2013-06-20 DIAGNOSIS — G4733 Obstructive sleep apnea (adult) (pediatric): Secondary | ICD-10-CM | POA: Diagnosis present

## 2013-06-20 DIAGNOSIS — J9611 Chronic respiratory failure with hypoxia: Secondary | ICD-10-CM | POA: Diagnosis present

## 2013-06-20 LAB — CBC
HCT: 37.1 % (ref 36.0–46.0)
Hemoglobin: 11.1 g/dL — ABNORMAL LOW (ref 12.0–15.0)
MCH: 32 pg (ref 26.0–34.0)
MCHC: 29.9 g/dL — ABNORMAL LOW (ref 30.0–36.0)

## 2013-06-20 LAB — BASIC METABOLIC PANEL
BUN: 68 mg/dL — ABNORMAL HIGH (ref 6–23)
CO2: 24 mEq/L (ref 19–32)
GFR calc non Af Amer: 4 mL/min — ABNORMAL LOW (ref >90)
Glucose, Bld: 86 mg/dL (ref 70–99)
Potassium: 4.7 mEq/L (ref 3.5–5.1)

## 2013-06-20 LAB — GLUCOSE, CAPILLARY: Glucose-Capillary: 85 mg/dL (ref 70–99)

## 2013-06-20 MED ORDER — ONDANSETRON HCL 4 MG PO TABS: 4.0000 mg | ORAL_TABLET | Freq: Four times a day (QID) | ORAL | Status: DC | PRN

## 2013-06-20 MED ORDER — SODIUM CHLORIDE 0.9 % IV SOLN: 100.0000 mL | INTRAVENOUS | Status: DC | PRN

## 2013-06-20 MED ORDER — ONDANSETRON HCL 4 MG/2ML IJ SOLN
4.0000 mg | Freq: Four times a day (QID) | INTRAMUSCULAR | Status: DC | PRN
  Filled 2013-06-20: qty 2

## 2013-06-20 MED ORDER — LIDOCAINE-PRILOCAINE 2.5-2.5 % EX CREA
1.0000 "application " | TOPICAL_CREAM | CUTANEOUS | Status: DC | PRN
  Filled 2013-06-20: qty 5

## 2013-06-20 MED ORDER — HEPARIN SODIUM (PORCINE) 5000 UNIT/ML IJ SOLN: 5000.0000 [IU] | Freq: Three times a day (TID) | INTRAMUSCULAR | Status: DC

## 2013-06-20 MED ORDER — SODIUM CHLORIDE 0.9 % IV SOLN: 250.0000 mL | INTRAVENOUS | Status: DC | PRN

## 2013-06-20 MED ORDER — SODIUM CHLORIDE 0.9 % IJ SOLN: 3.0000 mL | INTRAMUSCULAR | Status: DC | PRN

## 2013-06-20 MED ORDER — HEPARIN SODIUM (PORCINE) 1000 UNIT/ML DIALYSIS
1000.0000 [IU] | INTRAMUSCULAR | Status: DC | PRN
  Filled 2013-06-20: qty 1

## 2013-06-20 MED ORDER — NEPRO/CARBSTEADY PO LIQD
237.0000 mL | ORAL | Status: DC | PRN
  Filled 2013-06-20: qty 237

## 2013-06-20 MED ORDER — PENTAFLUOROPROP-TETRAFLUOROETH EX AERO: 1.0000 "application " | INHALATION_SPRAY | CUTANEOUS | Status: DC | PRN

## 2013-06-20 MED ORDER — ACETAMINOPHEN 650 MG RE SUPP: 650.0000 mg | Freq: Four times a day (QID) | RECTAL | Status: DC | PRN

## 2013-06-20 MED ORDER — ALTEPLASE 2 MG IJ SOLR
2.0000 mg | Freq: Once | INTRAMUSCULAR | Status: AC | PRN
  Filled 2013-06-20: qty 2

## 2013-06-20 MED ORDER — LIDOCAINE HCL (PF) 1 % IJ SOLN: 5.0000 mL | INTRAMUSCULAR | Status: DC | PRN

## 2013-06-20 MED ORDER — ACETAMINOPHEN 325 MG PO TABS
650.0000 mg | ORAL_TABLET | Freq: Four times a day (QID) | ORAL | Status: DC | PRN
  Administered 2013-06-21 – 2013-06-26 (×4): 650 mg via ORAL
  Filled 2013-06-20 (×5): qty 2

## 2013-06-20 MED ORDER — SODIUM CHLORIDE 0.9 % IJ SOLN: 3.0000 mL | Freq: Two times a day (BID) | INTRAMUSCULAR | Status: DC

## 2013-06-20 MED ORDER — DOCUSATE SODIUM 100 MG PO CAPS
100.0000 mg | ORAL_CAPSULE | Freq: Two times a day (BID) | ORAL | Status: DC
  Administered 2013-06-20 – 2013-06-26 (×8): 100 mg via ORAL
  Filled 2013-06-20 (×15): qty 1

## 2013-06-20 NOTE — Progress Notes (Signed)
Utilization review completed.  P.J. Sadee Osland,RN,BSN Case Manager [phone]  

## 2013-06-20 NOTE — Procedures (Signed)
I was present at this dialysis session. I have reviewed the session itself and made appropriate changes.   Suzanne Moselle  MD Pager [phone]    Cell  [phone] 06/20/2013, 5:27 PM

## 2013-06-20 NOTE — Progress Notes (Signed)
0830 Patient complains of chest pain and headache. Patient states she started feeling this way after nitro paste applied earlier this am. Points to epigastric area when c/o chest pain. Pain non radiating and no EKG changes noted. Patient given bedpan and pulled up in bed. After BM patient reports no further c/p and feels better. Times two nitro paste noted to right and left chest. Nitro paste patch removed. Head ache resolved. Dr. Sharon Seller made aware in to see patient. No further complaints.

## 2013-06-20 NOTE — Progress Notes (Signed)
[facility] Progress Note [place] TEAM 1 - Stepdown ICU Team   [name] WUJ:[id number] DOB: 1941-03-10 DOA: 06/19/2013 PCP: [name], MD  Brief narrative: 72 y.o. female who presented initially to [facility]. She had markedly elevated BP's in the ED there with SBPs of 260, acute respiratory failure on BIPAP, was encephalopathic, and in severe pulmonary edema. She was put on Cardene gtt initially as well as given labetalol and NTG paste. She was transferred to [facility]. By the time she arrived to [facility], she was off the BiPAP, off the cardene gtt, was normotensive, and had normal mentation. CCM  asked medicine to admit patient given she no longer required ICU level.  Assessment/Plan:  Acute respiratory failure with hypoxia/Acute pulmonary edema -improved after medical rxn - suspect will resolve after HD -??needs lower dry weight??- defer to Renal team  Chest pain -EKG with nonspecific ST changes/similar to July 2014 EKG -enzymes pending- first is normal -consider GI etiology  CKD (chronic kidney disease) stage V requiring chronic dialysis -per renal  DM2 (diabetes mellitus, type 2) -CBG controlled-continue SSI- on Glucotrol at home  C. difficile colitis -endorsed had been on oral meds for about 1 week (?? Vanco and Xifaxan)  HTN (hypertension) -resume home meds  Chronic respiratory failure with hypoxia due to:   A) COPD (chronic obstructive pulmonary disease)   B) Pulmonary HTN   C) OSA (obstructive sleep apnea) -chronic O2 at home -non compliant with CPAP  DVT prophylaxis: Subcutaneous heparin Code Status: Full Family Communication: Patient only Disposition Plan/Expected LOS: Telemetry Isolation: Contact isolation for MRSA PCR positive status as well as recent diagnosis C. difficile colitis  Consultants: Nephrology  Procedures: None  Antibiotics: Oral vancomycin 9/21 >>> Xifaxan 9/21 >>>  HPI/Subjective: Patient alert and still  complains of epigastric abdominal discomfort. No shortness of breath and no substernal or retrosternal chest pain nor any chest pain associated with left arm discomfort.  Objective: Blood pressure 108/63, pulse 88, temperature 98 F (36.7 C), temperature source Oral, resp. rate 26, height 5\' 4"  (1.626 m), weight 84.2 kg (185 lb 10 oz), SpO2 98.00%.  Intake/Output Summary (Last 24 hours) at 06/20/13 1756 Last data filed at 06/20/13 0800  Gross per 24 hour  Intake    180 ml  Output    500 ml  Net   -320 ml   Exam: General: No acute respiratory distress at rest Lungs: Clear to auscultation bilaterally without wheezes or crackles, 4 L Cardiovascular: Regular rate and rhythm without murmur gallop or rub normal S1 and S2, no peripheral edema or JVD Abdomen: Minimally tender epigastrum, nondistended, soft, bowel sounds positive, no rebound, no ascites, no appreciable mass Musculoskeletal: No significant cyanosis, clubbing of bilateral lower extremities Neurological: Alert and oriented x 3, moves all extremities x 4 without focal neurological deficits, CN 2-12 intact  Scheduled Meds:  Scheduled Meds: . calcium acetate  1,334 mg Oral TID WC  . carvedilol  6.25 mg Oral BID WC  . docusate sodium  100 mg Oral BID  . ezetimibe  10 mg Oral QHS  . fluticasone  1 puff Inhalation BID  . heparin  5,000 Units Subcutaneous Q8H  . insulin aspart  0-9 Units Subcutaneous TID WC  . vancomycin  125 mg Oral QID   Followed by  . [START ON 07/03/2013] rifaximin  400 mg Oral TID  . rOPINIRole  1 mg Oral BID WC  . sodium chloride  3 mL Intravenous Q12H    Data Reviewed:  Basic Metabolic Panel:  Recent Labs Lab 06/19/13 2221 06/20/13 0545  NA 138 138  K 4.6 4.7  CL 98 99  CO2 26 24  GLUCOSE 139* 86  BUN 61* 68*  CREATININE 7.44* 8.12*  CALCIUM 8.9 8.8   Liver Function Tests:  Recent Labs Lab 06/19/13 2221  AST 24  ALT 15  ALKPHOS 78  BILITOT 0.4  PROT 6.7  ALBUMIN 3.2*    CBC:  Recent Labs Lab 06/19/13 2221 06/20/13 0545  WBC 14.8* 14.5*  HGB 11.2* 11.1*  HCT 37.5 37.1  MCV 107.8* 106.9*  PLT 194 248   Cardiac Enzymes:  Recent Labs Lab 06/20/13 1300  TROPONINI <0.30   BNP (last 3 results)  Recent Labs  08/19/12 2020 10/19/12 1605 04/13/13 1644  PROBNP 10460.0* 17356.0* 12692.0*   CBG:  Recent Labs Lab 06/19/13 2001 06/20/13 0926 06/20/13 1204  GLUCAP 113* 137* 85    Recent Results (from the past 240 hour(s))  MRSA PCR SCREENING     Status: None   Collection Time    06/19/13  9:01 PM      Result Value Range Status   MRSA by PCR NEGATIVE  NEGATIVE Final   Comment:            The GeneXpert MRSA Assay (FDA     approved for NASAL specimens     only), is one component of a     comprehensive MRSA colonization     surveillance program. It is not     intended to diagnose MRSA     infection nor to guide or     monitor treatment for     MRSA infections.  CLOSTRIDIUM DIFFICILE BY PCR     Status: Abnormal   Collection Time    06/20/13 11:18 AM      Result Value Range Status   C difficile by pcr POSITIVE (*) NEGATIVE Final   Comment: CRITICAL RESULT CALLED TO, READ BACK BY AND VERIFIED WITH:     Aretha Parrot RN 14:25 06/20/13 ([email or web address])     Studies:  Recent x-ray studies have been reviewed in detail by the Attending Physician  Junious Silk, ANP [facility] Office  [phone] Pager [phone]  **If unable to reach the above provider after paging please contact the Flow Manager @ [phone]  On-Call/Text Page:      [email or web address]      password [email or web address]  If 7PM-7AM, please contact night-coverage [email or web address] Password [facility] 06/20/2013, 5:56 PM   LOS: 1 day   I have personally examined this patient and reviewed the entire database. I have reviewed the above note, made any necessary editorial changes, and agree with its content.  Lonia Blood, MD [facility]

## 2013-06-20 NOTE — Progress Notes (Signed)
Pt c/o being SOB and wants prn treatment. RN aware

## 2013-06-20 NOTE — Consult Note (Addendum)
Renal Service Consult Note [facility]  GREER WAINRIGHT 06/20/2013 Savvy Peeters D Requesting Physician:  Dr Sharon Seller  Reason for Consult:  ESRD pt with SOB HPI: The patient is a 72 y.o. year-old with hx of ESRD, HTN, afib, COPD and pulm HTN, ME, OSA, DM2 presented to [facility] ED on 9/21 with AMS, resp distress, HTN urgency and pulm edema. Put on cardene drip, NTP, labetalol and bipap and transferred to [facility].  Today is off NTP and bipap.  Was doing well but now SOB after bedbath.  No cp. Has not missed HD, says last HD on Fri was only "partial dialysis".    ROS  no fever, prod cough or CP  no abd pain, n/v/d  no jt pain  no rash   no confusion today  Past Medical History  Past Medical History  Diagnosis Date  . Carotid artery occlusion     bilat CEA in 2012?  . C. 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 Hypertension   . Atrial fibrillation April  2013  . Pulmonary hypertension   . Secondary hyperparathyroidism, renal   . COPD (chronic obstructive pulmonary disease)   . Hypercholesterolemia   . History of MI (myocardial infarction)     "they say I had 2 years ago" (08/20/2012)  . GERD (gastroesophageal reflux disease)   . History of pneumonia     "have it alot" (08/20/2012)  . Sleep apnea     "don't wear mask anymore"  . Type II diabetes mellitus   . History of blood transfusion 2013    "3 times so far in 2013:  4 pints one time; 2 pints another; ?# last time" (08/20/2012)  . Iron deficiency anemia   . Seizures 2012    after carotid surgery  . ESRD on dialysis     "[place]; Tues, Thurs; Sat"  . Renal cell carcinoma 2005?  Marland Kitchen On home oxygen therapy     "24/7"   . Acute diverticulitis     Oct 2013, treated medically [facility], sigmoid diverticulitits  . CHF (congestive heart failure)    Past Surgical History  Past Surgical History  Procedure Laterality Date  . Nephrectomy  2005    Right, for Renal cell carcinoma  . Carotid  endarterectomy  2012    bilaterally  . Av fistula placement  Jan. 7, 2011    Right  upper arm by Dr. Hart [place]  . Hd catheter  Aug. 22, 2013    [facility]  . Appendectomy      childhood  . Abdominal hysterectomy  1971?  Marland Kitchen Coronary angioplasty with stent placement  1990's    "1 total"  . Coronary angioplasty  1990's  . Av fistula repair  2013    right upper arm  . Esophagogastroduodenoscopy N/A 12/29/2012    Procedure: ESOPHAGOGASTRODUODENOSCOPY (EGD);  Surgeon: Petra Kuba, MD;  Location: [facility] ENDOSCOPY;  Service: Endoscopy;  Laterality: N/A;  . Colonoscopy with propofol N/A 12/31/2012    Procedure: COLONOSCOPY WITH PROPOFOL;  Surgeon: Petra Kuba, MD;  Location: [facility] ENDOSCOPY;  Service: Endoscopy;  Laterality: N/A;  currently IP at [facility] [id number]   Family History  Family History  Problem Relation Age of Onset  . Cancer Mother     pancreatic  . Stroke Father   . Coronary artery disease Father   . Cancer Brother     Brain  . Kidney disease Brother     Kidney stones  Social History  reports that she quit smoking about 9 years ago. Her smoking use included Cigarettes. She has a 67.5 pack-year smoking history. She has never used smokeless tobacco. She reports that she does not drink alcohol or use illicit drugs. Allergies  Allergies  Allergen Reactions  . Morphine And Related Nausea And Vomiting  . Lipitor [Atorvastatin] Other (See Comments)    Causes drowsiness  . Nsaids     Unknown reported by previous hospital   Home medications Prior to Admission medications   Medication Sig Start Date End Date Taking? Authorizing Provider  albuterol (PROVENTIL) (2.5 MG/3ML) 0.083% nebulizer solution Take 2.5 mg by nebulization 2 (two) times daily.   Yes Historical Provider, MD  calcium acetate (PHOSLO) 667 MG capsule Take 1,334 mg by mouth 3 (three) times daily with meals.   Yes Historical Provider, MD  carvedilol (COREG) 25 MG tablet Take 12.5 mg by mouth daily.    Yes Historical Provider, MD   ezetimibe (ZETIA) 10 MG tablet Take 10 mg by mouth at bedtime.    Yes Historical Provider, MD  fluticasone (FLOVENT HFA) 110 MCG/ACT inhaler Inhale 2 puffs into the lungs 3 (three) times daily as needed (for wheezing and shortness of breath).   Yes Historical Provider, MD  glipiZIDE (GLUCOTROL) 5 MG tablet Take 5 mg by mouth daily as needed (if CBG is >150).   Yes Historical Provider, MD  ipratropium (ATROVENT) 0.02 % nebulizer solution Take 500 mcg by nebulization every 6 (six) hours as needed for wheezing.   Yes Historical Provider, MD  rOPINIRole (REQUIP) 1 MG tablet Take 1 mg by mouth 2 (two) times daily.   Yes Historical Provider, MD   Liver Function Tests  Recent Labs Lab 06/19/13 2221  AST 24  ALT 15  ALKPHOS 78  BILITOT 0.4  PROT 6.7  ALBUMIN 3.2*   No results found for this basename: LIPASE, AMYLASE,  in the last 168 hours CBC  Recent Labs Lab 06/19/13 2221 06/20/13 0545  WBC 14.8* 14.5*  HGB 11.2* 11.1*  HCT 37.5 37.1  MCV 107.8* 106.9*  PLT 194 248   Basic Metabolic Panel  Recent Labs Lab 06/19/13 2221 06/20/13 0545  NA 138 138  K 4.6 4.7  CL 98 99  CO2 26 24  GLUCOSE 139* 86  BUN 61* 68*  CREATININE 7.44* 8.12*  CALCIUM 8.9 8.8    Physical Exam:  Blood pressure 121/55, pulse 92, temperature 98.2 F (36.8 C), temperature source Oral, resp. rate 26, height 5\' 4"  (1.626 m), weight 83.4 kg (183 lb 13.8 oz), SpO2 97.00%. Gen: obese chronically ill appearing female, SOB and tachypneic after bedbath Skin: no rash, cyanosis HEENT:  EOMI, sclera anicteric, throat clear Neck: + JVDf Chest: bibasilar rales 1/3 up post, some decrease in air movement overall, no wheezing CV: regular, 2/6 SEM RUSB which mostly resolves with AVF occlusionf Abdomen: soft, obese, NT and ND Ext: no LE or UE edema, no joint effusion, no gangrene/ulcers Neuro: alert, Ox3, nonfocal Access: RUA AVF loud bruit  Outpatient HD: (MWF [place])  4hr F160 400/1.5  81kg 2/2.5 Bath  RUA AVF   No heparin Hectorol none     EPO 7400     Venofer 50mg /wk  Assessment: 1. Dyspnea / pulm edema- ot much over dry wt, may have lost body wt > max UF with HD today 2. ESRD cont HD MWF, vol excess 3. Anemia Hb 11, hold esa 4. Metabolic bone disease- cont binders, no vit D 5. HTN  crisis- coreg, dec volume w HD 6. Hx COPD 7. DM 2- glipizide  Plan- HD asap today, dec vol, hold esa, repeat film in am  Vinson Moselle  MD Pager [phone]    Cell  [phone] 06/20/2013, 11:28 AM

## 2013-06-21 ENCOUNTER — Inpatient Hospital Stay (HOSPITAL_COMMUNITY): Payer: Medicare Other

## 2013-06-21 DIAGNOSIS — I10 Essential (primary) hypertension: Secondary | ICD-10-CM

## 2013-06-21 DIAGNOSIS — J449 Chronic obstructive pulmonary disease, unspecified: Secondary | ICD-10-CM

## 2013-06-21 LAB — TROPONIN I
Troponin I: <0.3 ng/mL (ref <0.30)
Troponin I: <0.3 ng/mL (ref <0.30)

## 2013-06-21 LAB — GLUCOSE, CAPILLARY: Glucose-Capillary: 118 mg/dL — ABNORMAL HIGH (ref 70–99)

## 2013-06-21 MED ORDER — BISACODYL 10 MG RE SUPP: 10.0000 mg | Freq: Once | RECTAL | Status: DC

## 2013-06-21 MED ORDER — POTASSIUM CHLORIDE CRYS ER 20 MEQ PO TBCR: 30.0000 meq | EXTENDED_RELEASE_TABLET | Freq: Two times a day (BID) | ORAL | Status: DC

## 2013-06-21 NOTE — Progress Notes (Signed)
[facility] Progress Note [place] TEAM 1 - Stepdown ICU Team   [name] JXB:[id number] DOB: 03/13/1941 DOA: 06/19/2013 PCP: [name], MD  Brief narrative: 72 y.o. female who presented initially to [facility]. She had markedly elevated BP's in the ED there with SBPs of 260, acute respiratory failure on BIPAP, was encephalopathic, and in severe pulmonary edema. She was put on Cardene gtt initially as well as given labetalol and NTG paste. She was transferred to [facility] at [facility]. By the time she arrived to [place], she was off the BiPAP, off the cardene gtt, was normotensive, and had normal mentation. CCM  asked medicine to admit patient given she no longer required ICU level.  Assessment/Plan:  Acute respiratory failure with hypoxia/Acute pulmonary edema -improved after medical rxn - suspect will resolve after HD-planned again for 9/24 -??needs lower dry weight??- defer to Renal team  Chest pain -EKG with nonspecific ST changes/similar to July 2014 EKG -enzymes negative x 3 -consider GI etiology  CKD (chronic kidney disease) stage V requiring chronic dialysis -per renal  DM2 (diabetes mellitus, type 2) -CBG controlled-continue SSI- on Glucotrol at home  C. difficile colitis -endorsed had been on oral meds for about 1 week (?? Vanco and Xifaxan) -has had 3 positive C diff collections: 07/30/12, 10/23/12, and 06/20/13 -ID consulted/ recommend prolonged taper PO Vanco over 6 weeks: 125mg  qid for 1 week  125mg  BID for 1 week  125mg  qday for 1 week  125mg  qod for 1 week  125mg  q3days for 14 days -Rifaxan dc'd 9/23 -CM made aware need to clarify if pre auth needed for PO Vanco  HTN (hypertension) -resume home meds  Chronic respiratory failure with hypoxia due to:   A) COPD (chronic obstructive pulmonary disease)   B) Pulmonary HTN   C) OSA (obstructive sleep apnea) -chronic O2 at home -non compliant with CPAP  DVT prophylaxis: Subcutaneous heparin Code Status:  Full Family Communication: Patient only Disposition Plan/Expected LOS: Telemetry Isolation: Contact isolation for MRSA PCR positive status as well as recent diagnosis C. difficile colitis  Consultants: Nephrology  Procedures: None  Antibiotics: Oral vancomycin 9/21 >>> Xifaxan 9/21 >>>  HPI/Subjective: Patient alert and endorses no further pain- still with diarrhea-HA better after dc nitrates  Objective: Blood pressure 129/64, pulse 95, temperature 98.3 F (36.8 C), temperature source Oral, resp. rate 20, height 5\' 4"  (1.626 m), weight 82.6 kg (182 lb 1.6 oz), SpO2 95.00%.  Intake/Output Summary (Last 24 hours) at 06/21/13 1331 Last data filed at 06/21/13 1000  Gross per 24 hour  Intake    240 ml  Output   2226 ml  Net  -1986 ml   Exam: General: No acute respiratory distress at rest Lungs: Clear to auscultation bilaterally without wheezes or crackles, 4 L Cardiovascular: Regular rate and rhythm without murmur gallop or rub normal S1 and S2, no peripheral edema or JVD Abdomen: Minimally tender epigastrum, nondistended, soft, bowel sounds positive, no rebound, no ascites, no appreciable mass Musculoskeletal: No significant cyanosis, clubbing of bilateral lower extremities Neurological: Alert and oriented x 3, moves all extremities x 4 without focal neurological deficits, CN 2-12 intact  Scheduled Meds:  Scheduled Meds: . calcium acetate  1,334 mg Oral TID WC  . carvedilol  6.25 mg Oral BID WC  . docusate sodium  100 mg Oral BID  . ezetimibe  10 mg Oral QHS  . fluticasone  1 puff Inhalation BID  . heparin  5,000 Units Subcutaneous Q8H  . insulin aspart  0-9 Units Subcutaneous TID WC  . rOPINIRole  1 mg Oral BID WC  . sodium chloride  3 mL Intravenous Q12H  . vancomycin  125 mg Oral QID    Data Reviewed: Basic Metabolic Panel:  Recent Labs Lab 06/19/13 2221 06/20/13 0545  NA 138 138  K 4.6 4.7  CL 98 99  CO2 26 24  GLUCOSE 139* 86  BUN 61* 68*  CREATININE  7.44* 8.12*  CALCIUM 8.9 8.8   Liver Function Tests:  Recent Labs Lab 06/19/13 2221  AST 24  ALT 15  ALKPHOS 78  BILITOT 0.4  PROT 6.7  ALBUMIN 3.2*   CBC:  Recent Labs Lab 06/19/13 2221 06/20/13 0545  WBC 14.8* 14.5*  HGB 11.2* 11.1*  HCT 37.5 37.1  MCV 107.8* 106.9*  PLT 194 248   Cardiac Enzymes:  Recent Labs Lab 06/20/13 1300 06/20/13 2056 06/21/13 0350  TROPONINI <0.30 <0.30 <0.30   BNP (last 3 results)  Recent Labs  08/19/12 2020 10/19/12 1605 04/13/13 1644  PROBNP 10460.0* 17356.0* 12692.0*   CBG:  Recent Labs Lab 06/20/13 1204 06/20/13 1839 06/20/13 2226 06/21/13 0804 06/21/13 1203  GLUCAP 85 94 118* 96 123*    Recent Results (from the past 240 hour(s))  MRSA PCR SCREENING     Status: None   Collection Time    06/19/13  9:01 PM      Result Value Range Status   MRSA by PCR NEGATIVE  NEGATIVE Final   Comment:            The GeneXpert MRSA Assay (FDA     approved for NASAL specimens     only), is one component of a     comprehensive MRSA colonization     surveillance program. It is not     intended to diagnose MRSA     infection nor to guide or     monitor treatment for     MRSA infections.  CLOSTRIDIUM DIFFICILE BY PCR     Status: Abnormal   Collection Time    06/20/13 11:18 AM      Result Value Range Status   C difficile by pcr POSITIVE (*) NEGATIVE Final   Comment: CRITICAL RESULT CALLED TO, READ BACK BY AND VERIFIED WITH:     Aretha Parrot RN 14:25 06/20/13 ([email or web address])     Studies:  Recent x-ray studies have been reviewed in detail by the Attending Physician  Junious Silk, ANP [facility] Office  [phone] Pager [phone]  **If unable to reach the above provider after paging please contact the Flow Manager @ [phone]  On-Call/Text Page:      [email or web address]      password [email or web address]  If 7PM-7AM, please contact night-coverage [email or web address] Password [facility] 06/21/2013, 1:31 PM   LOS: 2 days   I have examined the  patient, reviewed the chart and modified the above note which I agree with.   Landy Dunnavant,MD [phone] 06/21/2013, 5:14 PM

## 2013-06-21 NOTE — Care Management Note (Signed)
    Page 1 of 2   06/27/2013     2:59:23 PM   CARE MANAGEMENT NOTE 06/27/2013  Patient:  [name], [name]   Account Number:  [id number]  Date Initiated:  06/21/2013  Documentation initiated by:  [name]  Subjective/Objective Assessment:   HTN crisis     Action/Plan:   06/24/13 Pt for possible d/c on Vancomycin solution, however with benefits check pt insurance will not cover. Pt not stable for d/c, will reeval need for meds later.   Anticipated DC Date:  06/28/2013   Anticipated DC Plan:  HOME W HOME HEALTH SERVICES      DC Planning Services  CM consult  Medication Assistance      [facility] Choice  HOME HEALTH   Choice offered to / List presented to:  C-1 Patient        HH arranged  HH-1 RN  HH-2 PT      [facility] agency  [facility] HEALTH   Status of service:  Completed, signed off Medicare Important Message given?   (If response is "NO", the following Medicare IM given date fields will be blank) Date Medicare IM given:   Date Additional Medicare IM given:    Discharge Disposition:  HOME W HOME HEALTH SERVICES  Per UR Regulation:  Reviewed for med. necessity/level of care/duration of stay  If discussed at Long Length of Stay Meetings, dates discussed:    Comments:  06/27/13 [name] [phone] PT'S INSURANCE WILL APPROVE VANCOMYCIN 125MG  CAPSULES, AND MD AGREEABLE WITH THIS OUTPATIENT TREATMENT. PREAUTHORIZATION COMPLETED THROUGH [facility];  PT HAS AUTHORIZATION TO RECEIVE THIS MED THROUGH 08/22/2013.  PT WILL NEED HH FOLLOW UP; PREFERS HH THROUGH [facility], AS SHE HAS USED IN THE PAST.  FAXED REFERRAL TO [facility] AT [phone].  START OF CARE 24-48H POST DC DATE.  NO DME NEEDS, PER PT.  06/24/2013 Pt insurance would not cover Vancomycin Solution, would require letter to insurance provider requesting evaluation and possible approval. However pt has declined some and is not ready for d/c at this time, will reeval for needs over the weekend. Suzanne Shock  RN MPH, case manager, [phone]  06-21-13 3:45pm [name], RNBSN [phone]   According to [facility] Rifaxim only comes in 200 mg which the patient would pay 15% of the total cost of the drug. They did not have that cost.  Vancomycin Soultion only comes in 25 or 50 mg and that would be [name] capsule.  This would be the same price since the member has low income subsidy.  Prior Berkley Harvey is required.  Please call [phone].

## 2013-06-21 NOTE — Consult Note (Addendum)
INFECTIOUS DISEASE CONSULT NOTE  Date of Admission:  06/19/2013  Date of Consult:  06/21/2013  Reason for Consult: C diff, Recurrent Referring Physician: Sharon Seller  Impression/Recommendation Recurrent C diff Hypertensive Emergency ESRD Trach Aspirate- M catarrhalis Skin CA  Would Continue PO vanco Stop rifaximin Plan for prolonged taper (6 weeks)  125mg  qid for 1 week   125mg  BID for 1 week  125mg  qday for 1 week  125mg  qod for 1 week  125mg  q3days for 14 days  Check CXR  Defer treatment of M catarrhalis til CXR (or if clinical status changes) Needs derm f/u as outpt  Comment- Her C diff is fairly mild by her account (this is only her 2nd episode, is improving). Would try to avoid treating her with anbx (for trach cx) at this point to try and improver her chance of cure.   Available if questions  Thank you so much for this interesting consult,   [name] (pager) [phone] [email or web address]  [name] is an 72 y.o. female.  HPI: 72 yo F with hx of DM2 (~ 15 yrs), ESRD (due to renal CA) adm with hypertensive emergency on 9-19. She developed respiratory failure due to pulmonary edema (required BiPAP).  She was noted by her primary MD to have a recurrence of her C diff earlier in the week. Initial episode was Feb 2013? Previous C diff+ Jan 2014. She was started on vanco/rifaximin ~ 1 week PTA.  On adm WBC 14.8, afebrile. She was continued on vanco/rifaximin on adm.   Past Medical History  Diagnosis Date  . Carotid artery occlusion     bilat CEA in 2012?  . C. 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 Hypertension   . Atrial fibrillation April  2013  . Pulmonary hypertension   . Secondary hyperparathyroidism, renal   . COPD (chronic obstructive pulmonary disease)   . Hypercholesterolemia   . History of MI (myocardial infarction)     "they say I had 2 years ago" (08/20/2012)  . GERD (gastroesophageal reflux disease)   . History of  pneumonia     "have it alot" (08/20/2012)  . Sleep apnea     "don't wear mask anymore"  . Type II diabetes mellitus   . History of blood transfusion 2013    "3 times so far in 2013:  4 pints one time; 2 pints another; ?# last time" (08/20/2012)  . Iron deficiency anemia   . Seizures 2012    after carotid surgery  . ESRD on dialysis     "[place]; Tues, Thurs; Sat"  . Renal cell carcinoma 2005?  Marland Kitchen On home oxygen therapy     "24/7"   . Acute diverticulitis     Oct 2013, treated medically [facility], sigmoid diverticulitits  . CHF (congestive heart failure)     Past Surgical History  Procedure Laterality Date  . Nephrectomy  2005    Right, for Renal cell carcinoma  . Carotid endarterectomy  2012    bilaterally  . Av fistula placement  Jan. 7, 2011    Right  upper arm by Dr. Hart [place]  . Hd catheter  Aug. 22, 2013    [facility]  . Appendectomy      childhood  . Abdominal hysterectomy  1971?  Marland Kitchen Coronary angioplasty with stent placement  1990's    "1 total"  . Coronary angioplasty  1990's  . Av fistula repair  2013    right upper arm  .  Esophagogastroduodenoscopy N/A 12/29/2012    Procedure: ESOPHAGOGASTRODUODENOSCOPY (EGD);  Surgeon: Petra Kuba, MD;  Location: [facility] ENDOSCOPY;  Service: Endoscopy;  Laterality: N/A;  . Colonoscopy with propofol N/A 12/31/2012    Procedure: COLONOSCOPY WITH PROPOFOL;  Surgeon: Petra Kuba, MD;  Location: [facility] ENDOSCOPY;  Service: Endoscopy;  Laterality: N/A;  currently IP at [id number]     Allergies  Allergen Reactions  . Morphine And Related Nausea And Vomiting  . Lipitor [Atorvastatin] Other (See Comments)    Causes drowsiness  . Nsaids     Unknown reported by previous hospital    Medications:  Scheduled: . calcium acetate  1,334 mg Oral TID WC  . carvedilol  6.25 mg Oral BID WC  . docusate sodium  100 mg Oral BID  . ezetimibe  10 mg Oral QHS  . fluticasone  1 puff Inhalation BID  . heparin  5,000 Units Subcutaneous Q8H  .  insulin aspart  0-9 Units Subcutaneous TID WC  . vancomycin  125 mg Oral QID   Followed by  . [START ON 07/03/2013] rifaximin  400 mg Oral TID  . rOPINIRole  1 mg Oral BID WC  . sodium chloride  3 mL Intravenous Q12H    Total days of antibiotics: 3 (vanco/rifaximin)          Social History:  reports that she quit smoking about 9 years ago. Her smoking use included Cigarettes. She has a 67.5 pack-year smoking history. She has never used smokeless tobacco. She reports that she does not drink alcohol or use illicit drugs.  Family History  Problem Relation Age of Onset  . Cancer Mother     pancreatic  . Stroke Father   . Coronary artery disease Father   . Cancer Brother     Brain  . Kidney disease Brother     Kidney stones    General ROS: wt steady, eating well, no change in vision, diarrhea improving, no neuropathy, no problems with HD site, skin cancer on forehead (pt has derm appt), see HPI  Blood pressure 130/51, pulse 93, temperature 98.3 F (36.8 C), temperature source Oral, resp. rate 19, height 5\' 4"  (1.626 m), weight 82.6 kg (182 lb 1.6 oz), SpO2 95.00%. Eyes: negative findings: pupils equal, round, reactive to light and accomodation Throat: normal findings: oropharynx pink & moist without lesions or evidence of thrush Neck: no adenopathy and supple, symmetrical, trachea midline Lungs: clear to auscultation bilaterally Heart: regular rate and rhythm Abdomen: normal findings: bowel sounds normal and soft, non-tender Extremities: edema none LE and RUE fistula +Bruit, large scabbed lesion on R forearm.  Skin: ~ 1 cm in diameter basal cell CA (vs squamous cell) on forehead.    Results for orders placed during the hospital encounter of 06/19/13 (from the past 48 hour(s))  GLUCOSE, CAPILLARY     Status: Abnormal   Collection Time    06/19/13  8:01 PM      Result Value Range   Glucose-Capillary 113 (*) 70 - 99 mg/dL   Comment 1 Notify RN    MRSA PCR SCREENING     Status:  None   Collection Time    06/19/13  9:01 PM      Result Value Range   MRSA by PCR NEGATIVE  NEGATIVE   Comment:            The GeneXpert MRSA Assay (FDA     approved for NASAL specimens     only), is one component of  a     comprehensive MRSA colonization     surveillance program. It is not     intended to diagnose MRSA     infection nor to guide or     monitor treatment for     MRSA infections.  CBC     Status: Abnormal   Collection Time    06/19/13 10:21 PM      Result Value Range   WBC 14.8 (*) 4.0 - 10.5 K/uL   RBC 3.48 (*) 3.87 - 5.11 MIL/uL   Hemoglobin 11.2 (*) 12.0 - 15.0 g/dL   HCT 16.1  09.6 - 04.5 %   MCV 107.8 (*) 78.0 - 100.0 fL   MCH 32.2  26.0 - 34.0 pg   MCHC 29.9 (*) 30.0 - 36.0 g/dL   RDW 40.9 (*) 81.1 - 91.4 %   Platelets 194  150 - 400 K/uL  COMPREHENSIVE METABOLIC PANEL     Status: Abnormal   Collection Time    06/19/13 10:21 PM      Result Value Range   Sodium 138  135 - 145 mEq/L   Potassium 4.6  3.5 - 5.1 mEq/L   Chloride 98  96 - 112 mEq/L   CO2 26  19 - 32 mEq/L   Glucose, Bld 139 (*) 70 - 99 mg/dL   BUN 61 (*) 6 - 23 mg/dL   Creatinine, Ser 7.82 (*) 0.50 - 1.10 mg/dL   Calcium 8.9  8.4 - 95.6 mg/dL   Total Protein 6.7  6.0 - 8.3 g/dL   Albumin 3.2 (*) 3.5 - 5.2 g/dL   AST 24  0 - 37 U/L   ALT 15  0 - 35 U/L   Alkaline Phosphatase 78  39 - 117 U/L   Total Bilirubin 0.4  0.3 - 1.2 mg/dL   GFR calc non Af Amer 5 (*) >90 mL/min   GFR calc Af Amer 6 (*) >90 mL/min   Comment: (NOTE)     The eGFR has been calculated using the CKD EPI equation.     This calculation has not been validated in all clinical situations.     eGFR's persistently <90 mL/min signify possible Chronic Kidney     Disease.  CBC     Status: Abnormal   Collection Time    06/20/13  5:45 AM      Result Value Range   WBC 14.5 (*) 4.0 - 10.5 K/uL   RBC 3.47 (*) 3.87 - 5.11 MIL/uL   Hemoglobin 11.1 (*) 12.0 - 15.0 g/dL   HCT 21.3  08.6 - 57.8 %   MCV 106.9 (*) 78.0 - 100.0  fL   MCH 32.0  26.0 - 34.0 pg   MCHC 29.9 (*) 30.0 - 36.0 g/dL   RDW 46.9 (*) 62.9 - 52.8 %   Platelets 248  150 - 400 K/uL  BASIC METABOLIC PANEL     Status: Abnormal   Collection Time    06/20/13  5:45 AM      Result Value Range   Sodium 138  135 - 145 mEq/L   Potassium 4.7  3.5 - 5.1 mEq/L   Chloride 99  96 - 112 mEq/L   CO2 24  19 - 32 mEq/L   Glucose, Bld 86  70 - 99 mg/dL   BUN 68 (*) 6 - 23 mg/dL   Creatinine, Ser 4.13 (*) 0.50 - 1.10 mg/dL   Calcium 8.8  8.4 - 24.4 mg/dL   GFR calc non  Af Amer 4 (*) >90 mL/min   GFR calc Af Amer 5 (*) >90 mL/min   Comment: (NOTE)     The eGFR has been calculated using the CKD EPI equation.     This calculation has not been validated in all clinical situations.     eGFR's persistently <90 mL/min signify possible Chronic Kidney     Disease.  GLUCOSE, CAPILLARY     Status: Abnormal   Collection Time    06/20/13  9:26 AM      Result Value Range   Glucose-Capillary 137 (*) 70 - 99 mg/dL  CLOSTRIDIUM DIFFICILE BY PCR     Status: Abnormal   Collection Time    06/20/13 11:18 AM      Result Value Range   C difficile by pcr POSITIVE (*) NEGATIVE   Comment: CRITICAL RESULT CALLED TO, READ BACK BY AND VERIFIED WITH:     Aretha Parrot RN 14:25 06/20/13 ([email or web address])  GLUCOSE, CAPILLARY     Status: None   Collection Time    06/20/13 12:04 PM      Result Value Range   Glucose-Capillary 85  70 - 99 mg/dL  TROPONIN I     Status: None   Collection Time    06/20/13  1:00 PM      Result Value Range   Troponin I <0.30  <0.30 ng/mL   Comment:            Due to the release kinetics of cTnI,     a negative result within the first hours     of the onset of symptoms does not rule out     myocardial infarction with certainty.     If myocardial infarction is still suspected,     repeat the test at appropriate intervals.  GLUCOSE, CAPILLARY     Status: None   Collection Time    06/20/13  6:39 PM      Result Value Range   Glucose-Capillary 94  70 - 99  mg/dL  TROPONIN I     Status: None   Collection Time    06/20/13  8:56 PM      Result Value Range   Troponin I <0.30  <0.30 ng/mL   Comment:            Due to the release kinetics of cTnI,     a negative result within the first hours     of the onset of symptoms does not rule out     myocardial infarction with certainty.     If myocardial infarction is still suspected,     repeat the test at appropriate intervals.  GLUCOSE, CAPILLARY     Status: Abnormal   Collection Time    06/20/13 10:26 PM      Result Value Range   Glucose-Capillary 118 (*) 70 - 99 mg/dL  TROPONIN I     Status: None   Collection Time    06/21/13  3:50 AM      Result Value Range   Troponin I <0.30  <0.30 ng/mL   Comment:            Due to the release kinetics of cTnI,     a negative result within the first hours     of the onset of symptoms does not rule out     myocardial infarction with certainty.     If myocardial infarction is still suspected,     repeat the test at appropriate intervals.  GLUCOSE, CAPILLARY     Status: None   Collection Time    06/21/13  8:04 AM      Result Value Range   Glucose-Capillary 96  70 - 99 mg/dL      Component Value Date/Time   SDES TRACHEAL ASPIRATE 10/19/2012 1838   SPECREQUEST NONE 10/19/2012 1838   CULT  Value: ABUNDANT MORAXELLA CATARRHALIS(BRANHAMELLA) Note: BETA LACTAMASE POSITIVE 10/19/2012 1838   REPTSTATUS 10/21/2012 FINAL 10/19/2012 1838   Dg Abd Portable 1v  06/21/2013   *RADIOLOGY REPORT*  Clinical Data: Abdominal discomfort.  Possible colonic thickening.  PORTABLE ABDOMEN - 1 VIEW  Comparison:  None.  Findings: The bowel gas pattern is normal.  No radio-opaque calculi or other significant radiographic abnormality are seen. Advanced vascular calcification.  Surgical clips right mid abdomen.  IMPRESSION: Negative for acute abnormality.   Original Report Authenticated By: Davonna Belling, M.D.   Recent Results (from the past 240 hour(s))  MRSA PCR SCREENING      Status: None   Collection Time    06/19/13  9:01 PM      Result Value Range Status   MRSA by PCR NEGATIVE  NEGATIVE Final   Comment:            The GeneXpert MRSA Assay (FDA     approved for NASAL specimens     only), is one component of a     comprehensive MRSA colonization     surveillance program. It is not     intended to diagnose MRSA     infection nor to guide or     monitor treatment for     MRSA infections.  CLOSTRIDIUM DIFFICILE BY PCR     Status: Abnormal   Collection Time    06/20/13 11:18 AM      Result Value Range Status   C difficile by pcr POSITIVE (*) NEGATIVE Final   Comment: CRITICAL RESULT CALLED TO, READ BACK BY AND VERIFIED WITH:     Aretha Parrot RN 14:25 06/20/13 ([email or web address])      06/21/2013, 9:36 AM     LOS: 2 days

## 2013-06-21 NOTE — Progress Notes (Addendum)
Renal Service Daily Progress Note [facility]  Subjective: reports HA still , NTP d/c'd yest, just wants tylenol, not narcotics, SOB is better  Physical Exam:  Blood pressure 130/51, pulse 93, temperature 98.3 F (36.8 C), temperature source Oral, resp. rate 19, height 5\' 4"  (1.626 m), weight 82.6 kg (182 lb 1.6 oz), SpO2 95.00%. Gen: chronically ill older adult female, no distress Neck: + JVD  Chest: L clear, faint rales R base, better CV: regular, 2/6 SEM RUSB which partially resolves with AVF occlusion Abdomen: soft, obese, NT and ND  Ext: no LE or UE edema, no joint effusion, no gangrene/ulcers  Neuro: alert, Ox3, nonfocal  Access: RUA AVF loud bruit   Outpatient HD: (MWF [facility])  4hr    F160    400/1.5    81kg    2K/2.5Ca  RUA AVF    No heparin  Hectorol none     EPO 7400     Venofer 50mg /wk   Assessment:  1. Dyspnea / pulm edema- better today, only 2kg off yest, still up 1-2kg, repeat film today, HD tomorrow, further UF 2. ESRD cont HD MWF, pt says BP usually drops at HD 3. Anemia Hb 11, hold esa 4. Metabolic bone disease- cont binders, no vit D 5. HTN crisis- coreg, dec volume w HD 6. Hx COPD 7. DM 2- glipizide    Vinson Moselle  MD Pager [phone]    Cell  [phone] 06/21/2013, 10:16 AM    Recent Labs Lab 06/19/13 2221 06/20/13 0545  NA 138 138  K 4.6 4.7  CL 98 99  CO2 26 24  GLUCOSE 139* 86  BUN 61* 68*  CREATININE 7.44* 8.12*  CALCIUM 8.9 8.8    Recent Labs Lab 06/19/13 2221  AST 24  ALT 15  ALKPHOS 78  BILITOT 0.4  PROT 6.7  ALBUMIN 3.2*    Recent Labs Lab 06/19/13 2221 06/20/13 0545  WBC 14.8* 14.5*  HGB 11.2* 11.1*  HCT 37.5 37.1  MCV 107.8* 106.9*  PLT 194 248   . calcium acetate  1,334 mg Oral TID WC  . carvedilol  6.25 mg Oral BID WC  . docusate sodium  100 mg Oral BID  . ezetimibe  10 mg Oral QHS  . fluticasone  1 puff Inhalation BID  . heparin  5,000 Units Subcutaneous Q8H  . insulin aspart  0-9 Units  Subcutaneous TID WC  . rOPINIRole  1 mg Oral BID WC  . sodium chloride  3 mL Intravenous Q12H  . vancomycin  125 mg Oral QID     sodium chloride, sodium chloride, acetaminophen, acetaminophen, albuterol, calcium carbonate, feeding supplement (NEPRO CARB STEADY), heparin, labetalol, lidocaine (PF), lidocaine-prilocaine, ondansetron (ZOFRAN) IV, ondansetron, pentafluoroprop-tetrafluoroeth

## 2013-06-22 LAB — RENAL FUNCTION PANEL
BUN: 47 mg/dL — ABNORMAL HIGH (ref 6–23)
CO2: 26 mEq/L (ref 19–32)
Chloride: 101 mEq/L (ref 96–112)
Creatinine, Ser: 6.38 mg/dL — ABNORMAL HIGH (ref 0.50–1.10)
Glucose, Bld: 94 mg/dL (ref 70–99)
Potassium: 5.1 mEq/L (ref 3.5–5.1)

## 2013-06-22 LAB — GLUCOSE, CAPILLARY: Glucose-Capillary: 183 mg/dL — ABNORMAL HIGH (ref 70–99)

## 2013-06-22 LAB — TSH: TSH: 1.629 u[IU]/mL (ref 0.350–4.500)

## 2013-06-22 LAB — CBC
HCT: 34.9 % — ABNORMAL LOW (ref 36.0–46.0)
Hemoglobin: 10.1 g/dL — ABNORMAL LOW (ref 12.0–15.0)
MCV: 109.1 fL — ABNORMAL HIGH (ref 78.0–100.0)
Platelets: 166 10*3/uL (ref 150–400)
RBC: 3.2 MIL/uL — ABNORMAL LOW (ref 3.87–5.11)
WBC: 11.6 10*3/uL — ABNORMAL HIGH (ref 4.0–10.5)

## 2013-06-22 MED ORDER — LIDOCAINE HCL (PF) 1 % IJ SOLN: 5.0000 mL | INTRAMUSCULAR | Status: DC | PRN

## 2013-06-22 MED ORDER — LIDOCAINE-PRILOCAINE 2.5-2.5 % EX CREA: 1.0000 "application " | TOPICAL_CREAM | CUTANEOUS | Status: DC | PRN

## 2013-06-22 MED ORDER — LEVALBUTEROL HCL 0.63 MG/3ML IN NEBU
0.6300 mg | INHALATION_SOLUTION | Freq: Four times a day (QID) | RESPIRATORY_TRACT | Status: DC | PRN
  Administered 2013-06-24: 02:00:00 0.63 mg via RESPIRATORY_TRACT
  Filled 2013-06-22: qty 3

## 2013-06-22 MED ORDER — NEPRO/CARBSTEADY PO LIQD: 237.0000 mL | ORAL | Status: DC | PRN

## 2013-06-22 MED ORDER — HEPARIN BOLUS VIA INFUSION
2000.0000 [IU] | Freq: Once | INTRAVENOUS | Status: AC
  Administered 2013-06-22: 2000 [IU] via INTRAVENOUS
  Filled 2013-06-22: qty 2000

## 2013-06-22 MED ORDER — HEPARIN SODIUM (PORCINE) 1000 UNIT/ML DIALYSIS: 1000.0000 [IU] | INTRAMUSCULAR | Status: DC | PRN

## 2013-06-22 MED ORDER — DILTIAZEM HCL 25 MG/5ML IV SOLN
10.0000 mg | Freq: Once | INTRAVENOUS | Status: AC
  Administered 2013-06-22: 10 mg via INTRAVENOUS
  Filled 2013-06-22 (×2): qty 5

## 2013-06-22 MED ORDER — SODIUM CHLORIDE 0.9 % IV SOLN: 100.0000 mL | INTRAVENOUS | Status: DC | PRN

## 2013-06-22 MED ORDER — ALTEPLASE 2 MG IJ SOLR
2.0000 mg | Freq: Once | INTRAMUSCULAR | Status: DC | PRN
  Filled 2013-06-22: qty 2

## 2013-06-22 MED ORDER — PENTAFLUOROPROP-TETRAFLUOROETH EX AERO: 1.0000 "application " | INHALATION_SPRAY | CUTANEOUS | Status: DC | PRN

## 2013-06-22 MED ORDER — CARVEDILOL 12.5 MG PO TABS
12.5000 mg | ORAL_TABLET | Freq: Two times a day (BID) | ORAL | Status: DC
  Administered 2013-06-22 – 2013-06-26 (×7): 12.5 mg via ORAL
  Filled 2013-06-22 (×12): qty 1

## 2013-06-22 MED ORDER — METOPROLOL TARTRATE 1 MG/ML IV SOLN
5.0000 mg | Freq: Once | INTRAVENOUS | Status: AC
  Administered 2013-06-22: 20:00:00 5 mg via INTRAVENOUS
  Filled 2013-06-22: qty 5

## 2013-06-22 MED ORDER — DILTIAZEM HCL 60 MG PO TABS
60.0000 mg | ORAL_TABLET | Freq: Four times a day (QID) | ORAL | Status: DC
  Administered 2013-06-22 – 2013-06-26 (×14): 60 mg via ORAL
  Filled 2013-06-22 (×20): qty 1

## 2013-06-22 MED ORDER — HEPARIN (PORCINE) IN NACL 100-0.45 UNIT/ML-% IJ SOLN
1000.0000 [IU]/h | INTRAMUSCULAR | Status: DC
  Administered 2013-06-22: 18:00:00 1000 [IU]/h via INTRAVENOUS
  Filled 2013-06-22 (×2): qty 250

## 2013-06-22 NOTE — Procedures (Signed)
I was present at this dialysis session. I have reviewed the session itself and made appropriate changes.   Vinson Moselle  MD Pager [phone]    Cell  [phone] 06/22/2013, 9:12 AM

## 2013-06-22 NOTE — Progress Notes (Signed)
06/22/2013 5:49 PM  Pt heart rate staying anywhere from 100-138 range.  Heart rate mainly staying in the 100-120s.  Other vitals stable, no c/o chest pain, palpitations, or SOB.  Dr. Sharon Seller notified.  Will continue to monitor patient. [name]

## 2013-06-22 NOTE — Progress Notes (Signed)
[facility] Progress Note [place] TEAM 1 - Stepdown ICU Team   TABITHA RIGGINS ZOX:[id number] DOB: June 29, 1941 DOA: 06/19/2013 PCP: [name], MD  Brief narrative: 72 y.o. female who presented initially to [facility]. She had markedly elevated BP's in the ED there with SBPs of 260, acute respiratory failure on BIPAP, was encephalopathic, and in severe pulmonary edema. She was put on Cardene gtt initially as well as given labetalol and NTG paste. She was transferred to [facility]. By the time she arrived to [facility], she was off the BiPAP, off the cardene gtt, was normotensive, and had normal mentation. CCM  asked medicine to admit patient given she no longer required ICU level.  Assessment/Plan:  Acute respiratory failure with hypoxia / Acute pulmonary edema -improved after medical rxn -??needs lower dry weight??- defer to Renal team  SVT - hx of Atrial Fib diagnosed April 2013  Pt has developed tachycardia today post HD - rates 140-160 - is tolerating well symptomatically - denies a prior hx of Afib, but records suggest was diagnosed w/ same in [place] 2013 - must be paroxysmal in occurence as pt has been in NSR majority of hospital stay - will begin full anticoag - attempt to control with one time IV cardizem dose followed by oral cardizem - if does not improve, will have to consider return to SDU with IV cardizem - check TSH - of note, pt is on coreg only as outpt   Chest pain -EKG with nonspecific ST changes/similar to July 2014 EKG -enzymes negative x 3 -sx have resolved   CKD (chronic kidney disease) stage V requiring chronic dialysis -per renal  DM2 (diabetes mellitus, type 2) -CBG controlled - no change in tx plan today   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 Vanco and Xifaxan) -has had 3 positive C diff collections: 07/30/12, 10/23/12, and 06/20/13 -ID consulted/ recommend prolonged taper PO Vanco over 6  weeks: 125mg  qid for 1 week  125mg  BID for 1 week  125mg  qday for 1 week  125mg  qod for 1 week  125mg  q3days for 14 days -Rifaxan dc'd 9/23 -CM made aware need to clarify if pre auth needed for PO Vanco  HTN (hypertension) -follow w/ increase in coreg and addition of CCB  Chronic respiratory failure with hypoxia due to:   A) COPD (chronic obstructive pulmonary disease)   B) Pulmonary HTN   C) OSA (obstructive sleep apnea) -chronic O2 at home -non compliant with CPAP  DVT prophylaxis: full dose lovenox  Code Status: Full Family Communication: no family present at time of exam  Disposition Plan/Expected LOS: Telemetry Isolation: Contact isolation for MRSA PCR positive status as well as recent diagnosis C. difficile colitis  Consultants: Nephrology ID  Procedures: None  Antibiotics: Oral vancomycin 9/21 >>> Xifaxan 9/21 >>>9/23  HPI/Subjective: C/O feeling "tired."  Denies palpitations.  Denies sob at rest, cp, f/c or abdom pain.   Objective: Blood pressure 145/67, pulse 140, temperature 99.1 F (37.3 C), temperature source Oral, resp. rate 20, height 5\' 4"  (1.626 m), weight 85 kg (187 lb 6.3 oz), SpO2 97.00%.  Intake/Output Summary (Last 24 hours) at 06/22/13 1557 Last data filed at 06/22/13 1203  Gross per 24 hour  Intake    243 ml  Output   1618 ml  Net  -1375 ml   Exam: General: No acute respiratory distress at rest Lungs: Clear to auscultation bilaterally without wheezes or  crackles Cardiovascular: tachycardic - irreg irreg - no appreciable gallup/rub Abdomen: nontender, nondistended, soft, bowel sounds positive, no rebound, no ascites, no appreciable mass Musculoskeletal: No significant cyanosis, clubbing of bilateral lower extremities   Scheduled Meds:  Scheduled Meds: . calcium acetate  1,334 mg Oral TID WC  . carvedilol  6.25 mg Oral BID WC  . docusate sodium  100 mg Oral BID  . ezetimibe  10 mg Oral QHS  . fluticasone  1 puff Inhalation BID  .  heparin  5,000 Units Subcutaneous Q8H  . insulin aspart  0-9 Units Subcutaneous TID WC  . rOPINIRole  1 mg Oral BID WC  . sodium chloride  3 mL Intravenous Q12H  . vancomycin  125 mg Oral QID    Data Reviewed: Basic Metabolic Panel:  Recent Labs Lab 06/19/13 2221 06/20/13 0545 06/22/13 0701  NA 138 138 140  K 4.6 4.7 5.1  CL 98 99 101  CO2 26 24 26   GLUCOSE 139* 86 94  BUN 61* 68* 47*  CREATININE 7.44* 8.12* 6.38*  CALCIUM 8.9 8.8 8.5  PHOS  --   --  6.3*   Liver Function Tests:  Recent Labs Lab 06/19/13 2221 06/22/13 0701  AST 24  --   ALT 15  --   ALKPHOS 78  --   BILITOT 0.4  --   PROT 6.7  --   ALBUMIN 3.2* 3.0*   CBC:  Recent Labs Lab 06/19/13 2221 06/20/13 0545 06/22/13 0709  WBC 14.8* 14.5* 11.6*  HGB 11.2* 11.1* 10.1*  HCT 37.5 37.1 34.9*  MCV 107.8* 106.9* 109.1*  PLT 194 248 166   Cardiac Enzymes:  Recent Labs Lab 06/20/13 1300 06/20/13 2056 06/21/13 0350  TROPONINI <0.30 <0.30 <0.30   BNP (last 3 results)  Recent Labs  08/19/12 2020 10/19/12 1605 04/13/13 1644  PROBNP 10460.0* 17356.0* 12692.0*   CBG:  Recent Labs Lab 06/21/13 0804 06/21/13 1203 06/21/13 1614 06/21/13 2146 06/22/13 1200  GLUCAP 96 123* 152* 118* 107*    Recent Results (from the past 240 hour(s))  MRSA PCR SCREENING     Status: None   Collection Time    06/19/13  9:01 PM      Result Value Range Status   MRSA by PCR NEGATIVE  NEGATIVE Final   Comment:            The GeneXpert MRSA Assay (FDA     approved for NASAL specimens     only), is one component of a     comprehensive MRSA colonization     surveillance program. It is not     intended to diagnose MRSA     infection nor to guide or     monitor treatment for     MRSA infections.  CLOSTRIDIUM DIFFICILE BY PCR     Status: Abnormal   Collection Time    06/20/13 11:18 AM      Result Value Range Status   C difficile by pcr POSITIVE (*) NEGATIVE Final   Comment: CRITICAL RESULT CALLED TO, READ  BACK BY AND VERIFIED WITH:     Aretha Parrot RN 14:25 06/20/13 ([email or web address])     Studies:  Recent x-ray studies have been reviewed in detail by the Attending Physician  Lonia Blood, MD [facility] Office  [phone] Pager [phone]  On-Call/Text Page:      [email or web address]      password [email or web address]  If 7PM-7AM, please contact night-coverage [email or web address] Password [facility] 06/22/2013, 3:57 PM  LOS: 3 days

## 2013-06-22 NOTE — Progress Notes (Signed)
06/22/2013 6:46 PM  Pt heart rate now sustaining high 130s-142.  For the past several minutes she has been 140-142.  Pt is lying in bed visiting with family.  Other vitals appear stable.  Dr. Sharon Seller notified.  Awaiting orders.  Will continue to monitor. [name]

## 2013-06-22 NOTE — Progress Notes (Signed)
ANTICOAGULATION CONSULT NOTE - Initial Consult  Pharmacy Consult for IV heparin infusion Indication: atrial fibrillation  Allergies  Allergen Reactions  . Morphine And Related Nausea And Vomiting  . Lipitor [Atorvastatin] Other (See Comments)    Causes drowsiness  . Nsaids     Unknown reported by previous hospital    Patient Measurements: Height: 5\' 4"  (162.6 cm) Weight: 187 lb 6.3 oz (85 kg) IBW/kg (Calculated) : 54.7 Heparin Dosing Weight: 73 kg   Vital Signs: Temp: 99.1 F (37.3 C) (09/24 1300) Temp src: Oral (09/24 1300) BP: 145/67 mmHg (09/24 1300) Pulse Rate: 140 (09/24 1300)  Labs:  Recent Labs  06/19/13 2221 06/20/13 0545 06/20/13 1300 06/20/13 2056 06/21/13 0350 06/22/13 0701 06/22/13 0709  HGB 11.2* 11.1*  --   --   --   --  10.1*  HCT 37.5 37.1  --   --   --   --  34.9*  PLT 194 248  --   --   --   --  166  CREATININE 7.44* 8.12*  --   --   --  6.38*  --   TROPONINI  --   --  <0.30 <0.30 <0.30  --   --     Estimated Creatinine Clearance: 8.5 ml/min (by C-G formula based on Cr of 6.38).   Medical History: Past Medical History  Diagnosis Date  . Carotid artery occlusion     bilat CEA in 2012?  . C. 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 Hypertension   . Atrial fibrillation April  2013  . Pulmonary hypertension   . Secondary hyperparathyroidism, renal   . COPD (chronic obstructive pulmonary disease)   . Hypercholesterolemia   . History of MI (myocardial infarction)     "they say I had 2 years ago" (08/20/2012)  . GERD (gastroesophageal reflux disease)   . History of pneumonia     "have it alot" (08/20/2012)  . Sleep apnea     "don't wear mask anymore"  . Type II diabetes mellitus   . History of blood transfusion 2013    "3 times so far in 2013:  4 pints one time; 2 pints another; ?# last time" (08/20/2012)  . Iron deficiency anemia   . Seizures 2012    after carotid surgery  . ESRD on dialysis     "[place]; Tues,  Thurs; Sat"  . Renal cell carcinoma 2005?  Marland Kitchen On home oxygen therapy     "24/7"   . Acute diverticulitis     Oct 2013, treated medically [facility], sigmoid diverticulitits  . CHF (congestive heart failure)     Medications:  Prescriptions prior to admission  Medication Sig Dispense Refill  . albuterol (PROVENTIL) (2.5 MG/3ML) 0.083% nebulizer solution Take 2.5 mg by nebulization 2 (two) times daily.      . calcium acetate (PHOSLO) 667 MG capsule Take 1,334 mg by mouth 3 (three) times daily with meals.      . carvedilol (COREG) 12.5 MG tablet Take 12.5 mg by mouth 2 (two) times daily with a meal.      . ezetimibe (ZETIA) 10 MG tablet Take 10 mg by mouth at bedtime.       . fluticasone (FLOVENT HFA) 110 MCG/ACT inhaler Inhale 2 puffs into the lungs 3 (three) times daily as needed (for wheezing and shortness of breath).      Marland Kitchen glipiZIDE (GLUCOTROL) 5 MG tablet Take 5 mg by mouth daily as needed (  if CBG is >150).      Marland Kitchen ipratropium (ATROVENT) 0.02 % nebulizer solution Take 500 mcg by nebulization every 6 (six) hours as needed for wheezing.      Marland Kitchen rOPINIRole (REQUIP) 1 MG tablet Take 1 mg by mouth 2 (two) times daily.       Scheduled:  . calcium acetate  1,334 mg Oral TID WC  . carvedilol  12.5 mg Oral BID WC  . diltiazem  60 mg Oral Q6H  . docusate sodium  100 mg Oral BID  . ezetimibe  10 mg Oral QHS  . fluticasone  1 puff Inhalation BID  . insulin aspart  0-9 Units Subcutaneous TID WC  . rOPINIRole  1 mg Oral BID WC  . sodium chloride  3 mL Intravenous Q12H  . vancomycin  125 mg Oral QID    Assessment: 72 y.o female developed tachycardia today post HD.  She has a history of Afib per records which was diagnosed in 12/2011 although MD noted patient denies history of Afib.  Now to start IV heparin infusion for atrial fibrillation in patient with ESRD on HD qMWF.   SQ Heparin last  given at 14:54 today.  H/H 10.1/34.9, PLTC 166K;  No baseline INR.  No bleeding reported.   Goal of  Therapy:  Heparin level 0.3-0.7 units/ml Monitor platelets by anticoagulation protocol: Yes   Plan:  Heparin 2000 units IV bolus (lower dose due to recent SQ heparin dose).  Start IV heparin infusion at 1000 units/hr. Draw heparin level 8 hours after heparin IV bolus given.   Noah Delaine, RPh Clinical Pharmacist Pager: [phone]  06/22/2013,5:27 PM

## 2013-06-22 NOTE — Progress Notes (Signed)
0700 Patient in HD  0944 Report given to receiving nurse [phone]. Past medical history and present hospitalization reported. All belongings taken to room 5W27. Patient will be transported to 5W27 post HD treatment.

## 2013-06-22 NOTE — Progress Notes (Signed)
Renal Service Daily Progress Note [facility]  Subjective: feels a lot better, breathing back to normal, HA gone  Physical Exam:  Blood pressure 168/54, pulse 95, temperature 97.6 F (36.4 C), temperature source Oral, resp. rate 21, height 5\' 4"  (1.626 m), weight 85 kg (187 lb 6.3 oz), SpO2 93.00%. Gen: chronically ill older adult female, no distress Neck: no JVD  Chest: clear bilat CV: regular, 2/6 SEM RUSB which partially resolves with AVF occlusion Abdomen: soft, obese, NT and ND  Ext: no LE or UE edema Neuro: alert, Ox3, nonfocal  Access: RUA AVF loud bruit   Outpatient HD: (MWF [facility])  4hr    F160    400/1.5    81kg    2K/2.5Ca  RUA AVF    No heparin  Hectorol none     EPO 7400     Venofer 50mg /wk   Assessment/Plan:  1. Dyspnea / pulm edema- resolved clinically 2. ESRD cont HD MWF 3. Anemia Hb 11, hold esa 4. Metabolic bone disease- cont binders, no vit D 5. HTN crisis- clarified coreg dose with patient, an insurance nurse told her to take only 6.25 bid but she is supposed to be taking 12.5 bid (or 25 daily, but I would prefer the bid dosing).  Have corrected this to 12.5 bid on the PTA med list and this is what she should take on d/c 6. Hx COPD 7. DM 2- glipizide 8. Dispo- per primary, ok for d/c from renal standpoint    Suzanne Moselle  MD Pager [phone]    Cell  [phone] 06/22/2013, 9:12 AM    Recent Labs Lab 06/19/13 2221 06/20/13 0545 06/22/13 0701  NA 138 138 140  K 4.6 4.7 5.1  CL 98 99 101  CO2 26 24 26   GLUCOSE 139* 86 94  BUN 61* 68* 47*  CREATININE 7.44* 8.12* 6.38*  CALCIUM 8.9 8.8 8.5  PHOS  --   --  6.3*    Recent Labs Lab 06/19/13 2221 06/22/13 0701  AST 24  --   ALT 15  --   ALKPHOS 78  --   BILITOT 0.4  --   PROT 6.7  --   ALBUMIN 3.2* 3.0*    Recent Labs Lab 06/19/13 2221 06/20/13 0545 06/22/13 0709  WBC 14.8* 14.5* 11.6*  HGB 11.2* 11.1* 10.1*  HCT 37.5 37.1 34.9*  MCV 107.8* 106.9* 109.1*  PLT 194 248  166   . calcium acetate  1,334 mg Oral TID WC  . carvedilol  6.25 mg Oral BID WC  . docusate sodium  100 mg Oral BID  . ezetimibe  10 mg Oral QHS  . fluticasone  1 puff Inhalation BID  . heparin  5,000 Units Subcutaneous Q8H  . insulin aspart  0-9 Units Subcutaneous TID WC  . rOPINIRole  1 mg Oral BID WC  . sodium chloride  3 mL Intravenous Q12H  . vancomycin  125 mg Oral QID     sodium chloride, sodium chloride, acetaminophen, acetaminophen, albuterol, alteplase, calcium carbonate, feeding supplement (NEPRO CARB STEADY), heparin, lidocaine (PF), lidocaine-prilocaine, ondansetron (ZOFRAN) IV, ondansetron, pentafluoroprop-tetrafluoroeth

## 2013-06-22 NOTE — Progress Notes (Signed)
06/22/2013 4:35 PM  I have been monitoring patient heart rate for the afternoon.  It ranges anywhere from 110s-140.  Mainly staying in the 120-130 range.  Dr. Sharon Seller aware, orders for meds and an EKG as well as some labs written.  Pt remains otherwise stable.  Will continue to monitor. Theadora Rama

## 2013-06-22 NOTE — Progress Notes (Signed)
06/22/2013 12:45 PM  Pt transfer from 2100 around 1155.  Pt fully alert and oriented, no evidence of skin breakdown upon assessment.  Vitals stable, except for pulse, which rate is going anywhere from 127-160.  Pt c/o a mild midsternal chest pain that is an "ache".  Pt states that she does have this type of pain when she is SOB and trying to take deep breaths.  Pt placed on 3L of oxygen (she is dependent upon this amount at home), and her oxygen saturations are at 97%.  After a few moments, chest pain resolved.  Pulse remained elevated from 120s-150.  Dr. Sharon Seller notified, orders received to give missed dose of coreg from this morning and monitor patient.  Full assessment to EPIC.  Pt lives at home alone, states she does not use equipment to ambulate.  Pt is a moderate fall risk per protocol, yellow arm band and red socks applied, bed alarm turned on.  I explained to the patient her fall risk and necessary interventions, to which she verbalized understanding.  I oriented patient to the unit and to her room, and instructed her on how to utlize the call bell, to which she verbalized understanding.  Will continue to monitor patient status. [name]

## 2013-06-23 ENCOUNTER — Encounter (HOSPITAL_COMMUNITY): Payer: Self-pay | Admitting: *Deleted

## 2013-06-23 DIAGNOSIS — E119 Type 2 diabetes mellitus without complications: Secondary | ICD-10-CM

## 2013-06-23 DIAGNOSIS — I4891 Unspecified atrial fibrillation: Secondary | ICD-10-CM

## 2013-06-23 LAB — BASIC METABOLIC PANEL
Chloride: 102 mEq/L (ref 96–112)
GFR calc Af Amer: 12 mL/min — ABNORMAL LOW (ref >90)
GFR calc non Af Amer: 10 mL/min — ABNORMAL LOW (ref >90)
Potassium: 4.7 mEq/L (ref 3.5–5.1)
Sodium: 139 mEq/L (ref 135–145)

## 2013-06-23 LAB — CBC
MCH: 31.6 pg (ref 26.0–34.0)
MCHC: 28.7 g/dL — ABNORMAL LOW (ref 30.0–36.0)
MCV: 110 fL — ABNORMAL HIGH (ref 78.0–100.0)
Platelets: 175 10*3/uL (ref 150–400)
RDW: 15.5 % (ref 11.5–15.5)

## 2013-06-23 LAB — HEPARIN LEVEL (UNFRACTIONATED): Heparin Unfractionated: <0.1 IU/mL — ABNORMAL LOW (ref 0.30–0.70)

## 2013-06-23 LAB — GLUCOSE, CAPILLARY
Glucose-Capillary: 127 mg/dL — ABNORMAL HIGH (ref 70–99)
Glucose-Capillary: 124 mg/dL — ABNORMAL HIGH (ref 70–99)
Glucose-Capillary: 129 mg/dL — ABNORMAL HIGH (ref 70–99)

## 2013-06-23 MED ORDER — HEPARIN (PORCINE) IN NACL 100-0.45 UNIT/ML-% IJ SOLN
1600.0000 [IU]/h | INTRAMUSCULAR | Status: DC
  Administered 2013-06-23: 14:00:00 1150 [IU]/h via INTRAVENOUS
  Filled 2013-06-23 (×5): qty 250

## 2013-06-23 MED ORDER — SODIUM CHLORIDE 0.9 % IV SOLN
125.0000 mg | INTRAVENOUS | Status: DC
  Filled 2013-06-23 (×2): qty 10

## 2013-06-23 MED ORDER — DARBEPOETIN ALFA-POLYSORBATE 60 MCG/0.3ML IJ SOLN
60.0000 ug | INTRAMUSCULAR | Status: DC
  Administered 2013-06-24: 60 ug via INTRAVENOUS
  Filled 2013-06-23: qty 0.3

## 2013-06-23 NOTE — Progress Notes (Signed)
Pharmacy notified RN of undetectable heparin level.  IV assessed.  Minimal blood around insertion site.  No edema, erythema, or tenderness around catheter site.  Flushed with 3mL NS.  Positive blood return.  Heparin rate increased to 11.5.  IV placed 9/21.  IV team notified for routine restart

## 2013-06-23 NOTE — Progress Notes (Signed)
[facility] Progress Note  Subjective:   Resting in bed. Ready to go home. On chronic 02, states breathing is at baseline. Afib RVR last evening but now SR  Objective Filed Vitals:   06/22/13 1300 06/22/13 2211 06/22/13 2354 06/23/13 0525  BP: 145/67 114/50 104/62 92/53  Pulse: 140 78 81 76  Temp: 99.1 F (37.3 C) 97.6 F (36.4 C)  98.4 F (36.9 C)  TempSrc: Oral Oral  Oral  Resp: 20 20  18   Height:  5\' 4"  (1.626 m)    Weight:  81.421 kg (179 lb 8 oz)    SpO2: 97% 100%  98%   Physical Exam General: Elderly, chronically ill-appearing, NAD Heart: RRR Lungs: CTA bilaterally fine rales bilat bases Abdomen: soft, NT, obese, + BS Extremities: No LE edema Dialysis Access: R AVF + bruit  Outpatient HD: (MWF [facility])  4hr F160 400/1.5 81kg 2K/2.5Ca RUA AVF No heparin  Hectorol none EPO 7400 Venofer 50mg /wk  Assessment/Plan: 1. Dyspnea / pulm edema - resolved 2. Paroxsymal Atrial Fib -  Diagnosed 2013. Back in SR this a.m. Heparin per pharmacy  3. ESRD cont HD MWF 4. Anemia Hb 9.8 < 11.6, Resume ESA with HD tomorrow if still admitted. Incr op Epo. 5. Metabolic bone disease - Ca 8.4 (9.8 corrected) cont binders, no vit D 6. HTN crisis -  Per Dr. Arlean Hopping, coreg dose was clarified with patient, an insurance nurse told her to take only 6.25 bid but she is supposed to be taking 12.5 bid. We have corrected this to 12.5 bid on the PTA med list and this is what she should take on d/c.  She verbalized understanding today as well. Net UF 1.5L on 9/24.  7. Hx recurrent C. diff - On PO vanc per pharmacy, taper over 6 wks per ID 8. Hx COPD on home O2 9. DM 2- glipizide 10. Dispo- per primary  Scot Jun. Thad Ranger [facility] Pager [phone]     06/23/2013,9:42 AM  LOS: 4 days   Patient seen and examined.  I agree with assessment and plan as above with additions as indicated. Vinson Moselle  MD Pager [phone]    Cell  [phone] 06/23/2013, 12:53  PM   Additional Objective Labs: Basic Metabolic Panel:  Recent Labs Lab 06/20/13 0545 06/22/13 0701 06/23/13 0150  NA 138 140 139  K 4.7 5.1 4.7  CL 99 101 102  CO2 24 26 26   GLUCOSE 86 94 101*  BUN 68* 47* 27*  CREATININE 8.12* 6.38* 4.09*  CALCIUM 8.8 8.5 8.4  PHOS  --  6.3*  --    Liver Function Tests:  Recent Labs Lab 06/19/13 2221 06/22/13 0701  AST 24  --   ALT 15  --   ALKPHOS 78  --   BILITOT 0.4  --   PROT 6.7  --   ALBUMIN 3.2* 3.0*   No results found for this basename: LIPASE, AMYLASE,  in the last 168 hours CBC:  Recent Labs Lab 06/19/13 2221 06/20/13 0545 06/22/13 0709 06/23/13 0150  WBC 14.8* 14.5* 11.6* 9.8  HGB 11.2* 11.1* 10.1* 10.4*  HCT 37.5 37.1 34.9* 36.2  MCV 107.8* 106.9* 109.1* 110.0*  PLT 194 248 166 175   Blood Culture    Component Value Date/Time   SDES TRACHEAL ASPIRATE 10/19/2012 1838   SPECREQUEST NONE 10/19/2012 1838   CULT  Value: ABUNDANT MORAXELLA CATARRHALIS(BRANHAMELLA) Note: BETA LACTAMASE POSITIVE 10/19/2012 1838   REPTSTATUS 10/21/2012 FINAL 10/19/2012 1838  Cardiac Enzymes:  Recent Labs Lab 06/20/13 1300 06/20/13 2056 06/21/13 0350  TROPONINI <0.30 <0.30 <0.30   CBG:  Recent Labs Lab 06/21/13 2146 06/22/13 1200 06/22/13 1648 06/22/13 2210 06/23/13 0748  GLUCAP 118* 107* 183* 127* 124*   Iron Studies: No results found for this basename: IRON, TIBC, TRANSFERRIN, FERRITIN,  in the last 72 hours @lablastinr3 @ Studies/Results: Dg Chest Port 1 View  06/21/2013   CLINICAL DATA:  Followup pulmonary edema  EXAM: PORTABLE CHEST - 1 VIEW  COMPARISON:  06/19/2013  FINDINGS: Irregular interstitial thickening has mildly improved. Hazy perihilar airspace opacity has resolved.  The cardiac silhouette is mildly enlarged. No mediastinal or hilar masses are noted.  IMPRESSION: Improved pulmonary edema.   Electronically Signed   By: Amie Portland   On: 06/21/2013 13:52   Medications: . heparin 1,000 Units/hr  (06/22/13 1816)   . calcium acetate  1,334 mg Oral TID WC  . carvedilol  12.5 mg Oral BID WC  . diltiazem  60 mg Oral Q6H  . docusate sodium  100 mg Oral BID  . ezetimibe  10 mg Oral QHS  . fluticasone  1 puff Inhalation BID  . insulin aspart  0-9 Units Subcutaneous TID WC  . rOPINIRole  1 mg Oral BID WC  . sodium chloride  3 mL Intravenous Q12H  . vancomycin  125 mg Oral QID

## 2013-06-23 NOTE — Progress Notes (Signed)
ANTICOAGULATION CONSULT NOTE  Pharmacy Consult for heparin  Indication: atrial fibrillation  Allergies  Allergen Reactions  . Morphine And Related Nausea And Vomiting  . Lipitor [Atorvastatin] Other (See Comments)    Causes drowsiness  . Nsaids     Unknown reported by previous hospital    Patient Measurements: Height: 5\' 4"  (162.6 cm) Weight: 179 lb 8 oz (81.421 kg) IBW/kg (Calculated) : 54.7 Heparin Dosing Weight: 73 kg Vital Signs: Temp: 97.6 F (36.4 C) (09/24 2211) Temp src: Oral (09/24 2211) BP: 104/62 mmHg (09/24 2354) Pulse Rate: 81 (09/24 2354)  Labs:  Recent Labs  06/20/13 0545 06/20/13 1300 06/20/13 2056 06/21/13 0350 06/22/13 0701 06/22/13 0709 06/23/13 0150  HGB 11.1*  --   --   --   --  10.1* 10.4*  HCT 37.1  --   --   --   --  34.9* 36.2  PLT 248  --   --   --   --  166 175  HEPARINUNFRC  --   --   --   --   --   --  0.35  CREATININE 8.12*  --   --   --  6.38*  --   --   TROPONINI  --  <0.30 <0.30 <0.30  --   --   --     Estimated Creatinine Clearance: 8.4 ml/min (by C-G formula based on Cr of 6.38).  Assessment: 72 y.o female with Afib for heparin  Goal of Therapy:  Heparin level 0.3-0.7 units/ml Monitor platelets by anticoagulation protocol: Yes   Plan:  Continue Heparin at current rate   Geannie Risen, PharmD, BCPS  06/23/2013,2:29 AM

## 2013-06-23 NOTE — Progress Notes (Signed)
[facility] Progress Note [place] TEAM 1 - Stepdown ICU Team   [name] ZOX:[id number] DOB: Nov 04, 1940 DOA: 06/19/2013 PCP: [name], MD  Brief narrative: 72 y.o. female who presented initially to [facility]. She had markedly elevated BP's in the ED there with SBPs of 260, acute respiratory failure on BIPAP, was encephalopathic, and in severe pulmonary edema. She was put on Cardene gtt initially as well as given labetalol and NTG paste. She was transferred to [facility] at [facility]. By the time she arrived to [facility], she was off the BiPAP, off the cardene gtt, was normotensive, and had normal mentation. CCM  asked medicine to admit patient given she no longer required ICU level.  Assessment/Plan:  Acute respiratory failure with hypoxia / Acute pulmonary edema -improved after medical rxn -??needs lower dry weight??- defer to Renal team  SVT - hx of Atrial Fib diagnosed April 2013  Pt had developed tachycardia today post HD (rates 140-160) but tolerated well - denied a prior hx of Afib, but records suggested was diagnosed w/ same in [facility] 2013 - must be paroxysmal in occurence as pt has been in NSR majority of hospital stay - full anticoag started 9/24 given CHADVAsc 5 - attempted to control with one time IV cardizem dose followed by oral cardizem but had RVR overnight into 9/25-currently in NSR -TSH 1.629- of note, pt is on coreg only as outpt and since admit discovered "insurance nurse" decreased dose from 12.5 to 6.25 prior to admit-pt reluctant to proceed with Coumadin- consult Cardiology  Chest pain -EKG with nonspecific ST changes/similar to July 2014 EKG -enzymes negative x 3 -sx have resolved   CKD (chronic kidney disease) stage V requiring chronic dialysis -per renal  DM2 (diabetes mellitus, type 2) -CBG controlled - no change in tx plan today   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 Vanco and  Xifaxan) -has had 3 positive C diff collections: 07/30/12, 10/23/12, and 06/20/13 -ID consulted/ recommend prolonged taper PO Vanco over 6 weeks: 125mg  qid for 1 week  125mg  BID for 1 week  125mg  qday for 1 week  125mg  qod for 1 week  125mg  q3days for 14 days -Rifaxan dc'd 9/23 -CM made aware need to clarify if pre auth needed for PO Vanco  HTN (hypertension) -follow w/ increase in coreg and addition of CCB  Chronic respiratory failure with hypoxia due to:   A) COPD (chronic obstructive pulmonary disease)   B) Pulmonary HTN   C) OSA (obstructive sleep apnea) -chronic O2 at home -non compliant with CPAP  DVT prophylaxis: full dose lovenox  Code Status: Full Family Communication: no family present at time of exam  Disposition Plan/Expected LOS: Telemetry Isolation: Contact isolation for MRSA PCR positive status as well as recent diagnosis C. difficile colitis  Consultants: Nephrology ID  Procedures: None  Antibiotics: Oral vancomycin 9/21 >>> Xifaxan 9/21 >>>9/23  HPI/Subjective: Endorses still having loose stools but maybe less frequent. No awareness of tachypalpitations.   Objective: Blood pressure 92/53, pulse 76, temperature 98.4 F (36.9 C), temperature source Oral, resp. rate 18, height 5\' 4"  (1.626 m), weight 81.421 kg (179 lb 8 oz), SpO2 98.00%.  Intake/Output Summary (Last 24 hours) at 06/23/13 1118 Last data filed at 06/23/13 1000  Gross per 24 hour  Intake    278 ml  Output      0 ml  Net    278 ml   Exam: General:  No acute respiratory distress at rest Lungs: Clear to auscultation bilaterally without wheezes or crackles-chronic O2 Cardiovascular: tachycardic - irreg irreg - no appreciable gallup/rub Abdomen: nontender, nondistended, soft, bowel sounds positive, no rebound, no ascites, no appreciable mass Musculoskeletal: No significant cyanosis, clubbing of bilateral lower extremities   Scheduled Meds:  Scheduled Meds: . calcium acetate  1,334 mg  Oral TID WC  . carvedilol  12.5 mg Oral BID WC  . [START ON 06/24/2013] darbepoetin (ARANESP) injection - DIALYSIS  60 mcg Intravenous Q Fri-HD  . diltiazem  60 mg Oral Q6H  . docusate sodium  100 mg Oral BID  . ezetimibe  10 mg Oral QHS  . [START ON 06/24/2013] ferric gluconate (FERRLECIT/NULECIT) IV  125 mg Intravenous Q Fri-HD  . fluticasone  1 puff Inhalation BID  . insulin aspart  0-9 Units Subcutaneous TID WC  . rOPINIRole  1 mg Oral BID WC  . sodium chloride  3 mL Intravenous Q12H  . vancomycin  125 mg Oral QID    Data Reviewed: Basic Metabolic Panel:  Recent Labs Lab 06/19/13 2221 06/20/13 0545 06/22/13 0701 06/23/13 0150  NA 138 138 140 139  K 4.6 4.7 5.1 4.7  CL 98 99 101 102  CO2 26 24 26 26   GLUCOSE 139* 86 94 101*  BUN 61* 68* 47* 27*  CREATININE 7.44* 8.12* 6.38* 4.09*  CALCIUM 8.9 8.8 8.5 8.4  MG  --   --   --  2.3  PHOS  --   --  6.3*  --    Liver Function Tests:  Recent Labs Lab 06/19/13 2221 06/22/13 0701  AST 24  --   ALT 15  --   ALKPHOS 78  --   BILITOT 0.4  --   PROT 6.7  --   ALBUMIN 3.2* 3.0*   CBC:  Recent Labs Lab 06/19/13 2221 06/20/13 0545 06/22/13 0709 06/23/13 0150  WBC 14.8* 14.5* 11.6* 9.8  HGB 11.2* 11.1* 10.1* 10.4*  HCT 37.5 37.1 34.9* 36.2  MCV 107.8* 106.9* 109.1* 110.0*  PLT 194 248 166 175   Cardiac Enzymes:  Recent Labs Lab 06/20/13 1300 06/20/13 2056 06/21/13 0350  TROPONINI <0.30 <0.30 <0.30   BNP (last 3 results)  Recent Labs  08/19/12 2020 10/19/12 1605 04/13/13 1644  PROBNP 10460.0* 17356.0* 12692.0*   CBG:  Recent Labs Lab 06/21/13 2146 06/22/13 1200 06/22/13 1648 06/22/13 2210 06/23/13 0748  GLUCAP 118* 107* 183* 127* 124*    Recent Results (from the past 240 hour(s))  MRSA PCR SCREENING     Status: None   Collection Time    06/19/13  9:01 PM      Result Value Range Status   MRSA by PCR NEGATIVE  NEGATIVE Final   Comment:            The GeneXpert MRSA Assay (FDA     approved  for NASAL specimens     only), is one component of a     comprehensive MRSA colonization     surveillance program. It is not     intended to diagnose MRSA     infection nor to guide or     monitor treatment for     MRSA infections.  CLOSTRIDIUM DIFFICILE BY PCR     Status: Abnormal   Collection Time    06/20/13 11:18 AM      Result Value Range Status   C difficile by pcr POSITIVE (*) NEGATIVE Final   Comment: CRITICAL RESULT  CALLED TO, READ BACK BY AND VERIFIED WITH:     Aretha Parrot RN 14:25 06/20/13 ([email or web address])     Studies:  Recent x-ray studies have been reviewed in detail by the Attending Physician  Junious Silk, ANP [facility] Office  [phone] Pager [phone]  On-Call/Text Page:      [email or web address]      password [email or web address]  If 7PM-7AM, please contact night-coverage [email or web address] Password [email or web address] 06/23/2013, 11:18 AM   LOS: 4 days    I have examined the patient, reviewed the chart and modified the above note which I agree with.   Tien Aispuro,MD [phone] 06/23/2013, 6:09 PM

## 2013-06-23 NOTE — Consult Note (Addendum)
Admit date: 06/19/2013 Referring Physician  Dr. Butler Denmark, [facility] Primary Physician  [name], M.D. Primary Cardiologist  , Dr. Leonette Nutting, [facility] cardiology Reason for Consultation  atrial fibrillation, Coumadin requirement  ASSESSMENT: 1. Paroxysmal atrial fibrillation, with CHADS-VASC .   2. Hypertensive emergency, resolved.  3. End stage kidney disease, on chronic dialysis -  [facility]  4. COPD.   5. Obstructive sleep apnea  PLAN:   1. Agree that long-term anticoagulation is indicated. She does have an increased risk of bleeding. She does have a personal cardiologist --  Dr. Leonette Nutting in [place]. This should be coordinated with him.  2. Additionally, if it is felt that episodes of atrial fibrillation , precipitate acute pulmonary edema, rhythm control with amiodarone would be a consideration.  3. Will speak with Dr. Butler Denmark in a.m. to see if there were other cardiac issues/concerns.  4. Aggressive dialysis to prevent blood pressure elevation and pulmonary edema   HPI:  The patient was admitted by the hospitalist service. After being transferred from [facility] with hypertensive emergency. She was initially to go to the critical care service but by the time of arrival her blood pressure had improved and respiratory failure was also much better. Since that time. She has undergone dialysis and heart failure has significantly improved. Blood pressure is also an excellent control. There is no chest pain associated with the presentation. According to the patient. She does have coronary disease with prior stents. She is asymptomatic at this time   PMH:   Past Medical History  Diagnosis Date  . Carotid artery occlusion     bilat CEA in 2012?  . C. 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 Hypertension   . Atrial fibrillation April  2013  . Pulmonary hypertension   . Secondary hyperparathyroidism, renal   . COPD (chronic obstructive pulmonary  disease)   . Hypercholesterolemia   . History of MI (myocardial infarction)     "they say I had 2 years ago" (08/20/2012)  . GERD (gastroesophageal reflux disease)   . History of pneumonia     "have it alot" (08/20/2012)  . Sleep apnea     "don't wear mask anymore"  . Type II diabetes mellitus   . History of blood transfusion 2013    "3 times so far in 2013:  4 pints one time; 2 pints another; ?# last time" (08/20/2012)  . Iron deficiency anemia   . Seizures 2012    after carotid surgery  . ESRD on dialysis     "[place]; Tues, Thurs; Sat"  . Renal cell carcinoma 2005?  Marland Kitchen On home oxygen therapy     "24/7"   . Acute diverticulitis     Oct 2013, treated medically [facility], sigmoid diverticulitits  . CHF (congestive heart failure)      PSH:   Past Surgical History  Procedure Laterality Date  . Nephrectomy  2005    Right, for Renal cell carcinoma  . Carotid endarterectomy  2012    bilaterally  . Av fistula placement  Jan. 7, 2011    Right  upper arm by Dr. Hart Rochester  . Hd catheter  Aug. 22, 2013    [facility]  . Appendectomy      childhood  . Abdominal hysterectomy  1971?  Marland Kitchen Coronary angioplasty with stent placement  1990's    "1 total"  . Coronary angioplasty  1990's  . Av fistula repair  2013    right  upper arm  . Esophagogastroduodenoscopy N/A 12/29/2012    Procedure: ESOPHAGOGASTRODUODENOSCOPY (EGD);  Surgeon: Petra Kuba, MD;  Location: [facility] ENDOSCOPY;  Service: Endoscopy;  Laterality: N/A;  . Colonoscopy with propofol N/A 12/31/2012    Procedure: COLONOSCOPY WITH PROPOFOL;  Surgeon: Petra Kuba, MD;  Location: [facility] ENDOSCOPY;  Service: Endoscopy;  Laterality: N/A;  currently IP at [facility]    Allergies:  Morphine and related; Lipitor; and Nsaids Prior to Admit Meds:   Prescriptions prior to admission  Medication Sig Dispense Refill  . albuterol (PROVENTIL) (2.5 MG/3ML) 0.083% nebulizer solution Take 2.5 mg by nebulization 2 (two) times daily.      .  calcium acetate (PHOSLO) 667 MG capsule Take 1,334 mg by mouth 3 (three) times daily with meals.      . carvedilol (COREG) 12.5 MG tablet Take 12.5 mg by mouth 2 (two) times daily with a meal.      . ezetimibe (ZETIA) 10 MG tablet Take 10 mg by mouth at bedtime.       . fluticasone (FLOVENT HFA) 110 MCG/ACT inhaler Inhale 2 puffs into the lungs 3 (three) times daily as needed (for wheezing and shortness of breath).      Marland Kitchen glipiZIDE (GLUCOTROL) 5 MG tablet Take 5 mg by mouth daily as needed (if CBG is >150).      Marland Kitchen ipratropium (ATROVENT) 0.02 % nebulizer solution Take 500 mcg by nebulization every 6 (six) hours as needed for wheezing.      Marland Kitchen rOPINIRole (REQUIP) 1 MG tablet Take 1 mg by mouth 2 (two) times daily.       Fam HX:    Family History  Problem Relation Age of Onset  . Cancer Mother     pancreatic  . Stroke Father   . Coronary artery disease Father   . Cancer Brother     Brain  . Kidney disease Brother     Kidney stones   Social HX:    History   Social History  . Marital Status: Widowed    Spouse Name: N/A    Number of Children: N/A  . Years of Education: N/A   Occupational History  . Not on file.   Social History Main Topics  . Smoking status: Former Smoker -- 1.50 packs/day for 45 years    Types: Cigarettes    Quit date: 09/30/2003  . Smokeless tobacco: Never Used  . Alcohol Use: No  . Drug Use: No  . Sexual Activity: No   Other Topics Concern  . Not on file   Social History Narrative  . No narrative on file     Review of Systems: Not helpful   Physical Exam: Blood pressure 148/44, pulse 76, temperature 98.6 F (37 C), temperature source Oral, resp. rate 18, height 5\' 4"  (1.626 m), weight 81.421 kg (179 lb 8 oz), SpO2 100.00%. Weight change: -2.779 kg (-6 lb 2 oz)   Obese, female, appearing older than her stated age of 77. She is lying relatively flat in bed with oxygen on.  Moderate JVD with the patient lying at 30. Left carotid bruit is  heard.  Chest reveals decreased breath sounds without wheezes or rales.  Cardiac exam is relatively unremarkable. No obvious murmur or gallop is heard.  There is no peripheral edema.  Abdomen is nontender.  The patient is awake, and alert. There are no focal, motor or sensory deficits  Labs:   Lab Results  Component Value Date   WBC 9.8 06/23/2013  HGB 10.4* 06/23/2013   HCT 36.2 06/23/2013   MCV 110.0* 06/23/2013   PLT 175 06/23/2013    Recent Labs Lab 06/19/13 2221  06/23/13 0150  NA 138  < > 139  K 4.6  < > 4.7  CL 98  < > 102  CO2 26  < > 26  BUN 61*  < > 27*  CREATININE 7.44*  < > 4.09*  CALCIUM 8.9  < > 8.4  PROT 6.7  --   --   BILITOT 0.4  --   --   ALKPHOS 78  --   --   ALT 15  --   --   AST 24  --   --   GLUCOSE 139*  < > 101*  < > = values in this interval not displayed. No results found for this basename: PTT   Lab Results  Component Value Date   INR 1.03 09/22/2012   INR 0.99 12/18/2009   INR 0.98 10/03/2009   Lab Results  Component Value Date   CKTOTAL 77 10/19/2012   CKMB 5.3* 10/19/2012   TROPONINI <0.30 06/21/2013      Radiology:   EXAM:  PORTABLE CHEST - 1 VIEW  COMPARISON: 06/19/2013  FINDINGS:  Irregular interstitial thickening has mildly improved. Hazy  perihilar airspace opacity has resolved.  The cardiac silhouette is mildly enlarged. No mediastinal or hilar  masses are noted.  IMPRESSION:  Improved pulmonary edema.   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 Systolic function was vigorous. The estimated ejection fraction was in the range of 65% to 70%. There is apical to mid-ventricle systolic cavity obliteration noted. Images were inadequate for LV wall motion assessment. LV diastolic function  cannot be assessed due to EA fusion from tachycardia. - Aortic valve: Sclerosis without stenosis. No regurgitation. - Mitral valve: Moderately calcified annulus. Mild regurgitation. - Left atrium: The atrium was normal in size. - Atrial septum: No defect or patent foramen ovale was identified. - Pulmonary arteries: PA peak pressure: At least 37mm Hg (S). - Inferior vena cava: The vessel was dilated; the respirophasic diameter changes were blunted (< 50%); findings are consistent with elevated central venous pressure - however, this may in part be due to positive pressure ventilation.   Lesleigh Noe 06/23/2013 8:55 PM

## 2013-06-23 NOTE — Progress Notes (Signed)
ANTICOAGULATION CONSULT NOTE  Pharmacy Consult for heparin  Indication: atrial fibrillation  Allergies  Allergen Reactions  . Morphine And Related Nausea And Vomiting  . Lipitor [Atorvastatin] Other (See Comments)    Causes drowsiness  . Nsaids     Unknown reported by previous hospital    Patient Measurements: Height: 5\' 4"  (162.6 cm) Weight: 179 lb 8 oz (81.421 kg) IBW/kg (Calculated) : 54.7 Heparin Dosing Weight: 73 kg Vital Signs: Temp: 98.4 F (36.9 C) (09/25 0525) Temp src: Oral (09/25 0525) BP: 92/53 mmHg (09/25 0525) Pulse Rate: 76 (09/25 0525)  Labs:  Recent Labs  06/20/13 2056 06/21/13 0350 06/22/13 0701 06/22/13 0709 06/23/13 0150 06/23/13 1207  HGB  --   --   --  10.1* 10.4*  --   HCT  --   --   --  34.9* 36.2  --   PLT  --   --   --  166 175  --   HEPARINUNFRC  --   --   --   --  0.35 <0.10*  CREATININE  --   --  6.38*  --  4.09*  --   TROPONINI <0.30 <0.30  --   --   --   --     Estimated Creatinine Clearance: 13 ml/min (by C-G formula based on Cr of 4.09).  Assessment: 72 y.o female with Afib for heparin.  Initial heparin level therapeutic at 0.35, but confirmatory heparin level now undetectable at < 0.1.  Spoke with RN, no issues with IV infusion, drip running appropriately.  No bleeding or complications noted per chart notes.  Goal of Therapy:  Heparin level 0.3-0.7 units/ml Monitor platelets by anticoagulation protocol: Yes   Plan:  1. Increase IV heparin to 1150 units/hr. 2. Recheck heparin level in 8 hrs. 3. Continue daily heparin level and CBC. 4. F/U plans for oral anticoagulation.  Tad Moore, BCPS  Clinical Pharmacist Pager [phone]  06/23/2013 1:41 PM

## 2013-06-24 ENCOUNTER — Inpatient Hospital Stay (HOSPITAL_COMMUNITY): Payer: Medicare Other

## 2013-06-24 DIAGNOSIS — G9340 Encephalopathy, unspecified: Secondary | ICD-10-CM

## 2013-06-24 LAB — GLUCOSE, CAPILLARY
Glucose-Capillary: 87 mg/dL (ref 70–99)
Glucose-Capillary: 148 mg/dL — ABNORMAL HIGH (ref 70–99)
Glucose-Capillary: 116 mg/dL — ABNORMAL HIGH (ref 70–99)

## 2013-06-24 LAB — CBC
HCT: 33.7 % — ABNORMAL LOW (ref 36.0–46.0)
Hemoglobin: 9.8 g/dL — ABNORMAL LOW (ref 12.0–15.0)
MCH: 31.9 pg (ref 26.0–34.0)
MCHC: 29.1 g/dL — ABNORMAL LOW (ref 30.0–36.0)
MCV: 109.8 fL — ABNORMAL HIGH (ref 78.0–100.0)
Platelets: 176 10*3/uL (ref 150–400)
RDW: 15.5 % (ref 11.5–15.5)

## 2013-06-24 LAB — RENAL FUNCTION PANEL
BUN: 47 mg/dL — ABNORMAL HIGH (ref 6–23)
Calcium: 8.9 mg/dL (ref 8.4–10.5)
Creatinine, Ser: 6.38 mg/dL — ABNORMAL HIGH (ref 0.50–1.10)
Glucose, Bld: 103 mg/dL — ABNORMAL HIGH (ref 70–99)
Phosphorus: 6.4 mg/dL — ABNORMAL HIGH (ref 2.3–4.6)
Potassium: 5.3 mEq/L — ABNORMAL HIGH (ref 3.5–5.1)
Sodium: 134 mEq/L — ABNORMAL LOW (ref 135–145)

## 2013-06-24 LAB — PROTIME-INR
INR: 0.95 (ref 0.00–1.49)
Prothrombin Time: 12.5 seconds (ref 11.6–15.2)

## 2013-06-24 LAB — BLOOD GAS, ARTERIAL
Acid-Base Excess: 5.7 mmol/L — ABNORMAL HIGH (ref 0.0–2.0)
Bicarbonate: 32.4 mEq/L — ABNORMAL HIGH (ref 20.0–24.0)
Bicarbonate: 30.4 mEq/L — ABNORMAL HIGH (ref 20.0–24.0)
Drawn by: 296031
Inspiratory PAP: 12
O2 Saturation: 85.8 %
O2 Saturation: 98.9 %
Patient temperature: 98.6
TCO2: 34.7 mmol/L (ref 0–100)
TCO2: 32.5 mmol/L (ref 0–100)
pCO2 arterial: 69.4 mmHg (ref 35.0–45.0)
pH, Arterial: 7.263 — ABNORMAL LOW (ref 7.350–7.450)
pO2, Arterial: 58.8 mmHg — ABNORMAL LOW (ref 80.0–100.0)
pO2, Arterial: 280 mmHg — ABNORMAL HIGH (ref 80.0–100.0)

## 2013-06-24 LAB — MRSA PCR SCREENING: MRSA by PCR: NEGATIVE

## 2013-06-24 MED ORDER — WARFARIN VIDEO
1.0000 | Freq: Once | Status: AC
  Administered 2013-06-25: 1

## 2013-06-24 MED ORDER — WARFARIN - PHARMACIST DOSING INPATIENT
Freq: Every day | Status: DC
  Administered 2013-06-25 – 2013-06-26 (×2)

## 2013-06-24 MED ORDER — HEPARIN BOLUS VIA INFUSION
1000.0000 [IU] | Freq: Once | INTRAVENOUS | Status: AC
  Administered 2013-06-24: 1000 [IU] via INTRAVENOUS
  Filled 2013-06-24: qty 1000

## 2013-06-24 MED ORDER — DILTIAZEM HCL ER COATED BEADS 240 MG PO CP24: 240.0000 mg | ORAL_CAPSULE | Freq: Every day | ORAL | Status: DC

## 2013-06-24 MED ORDER — WARFARIN SODIUM 5 MG PO TABS
5.0000 mg | ORAL_TABLET | Freq: Once | ORAL | Status: AC
  Administered 2013-06-24: 5 mg via ORAL
  Filled 2013-06-24: qty 1

## 2013-06-24 MED ORDER — DARBEPOETIN ALFA-POLYSORBATE 60 MCG/0.3ML IJ SOLN
INTRAMUSCULAR | Status: AC
  Filled 2013-06-24: qty 0.3

## 2013-06-24 MED ORDER — LORAZEPAM 2 MG/ML IJ SOLN: 0.5000 mg | Freq: Once | INTRAMUSCULAR | Status: DC

## 2013-06-24 MED ORDER — VANCOMYCIN 50 MG/ML ORAL SOLUTION: ORAL | Status: DC

## 2013-06-24 MED ORDER — CARVEDILOL 12.5 MG PO TABS: 12.5000 mg | ORAL_TABLET | Freq: Two times a day (BID) | ORAL | Status: DC

## 2013-06-24 MED ORDER — WARFARIN SODIUM 5 MG PO TABS: ORAL_TABLET | ORAL | Status: DC

## 2013-06-24 MED ORDER — COUMADIN BOOK
1.0000 | Freq: Once | Status: AC
  Administered 2013-06-24: 1
  Filled 2013-06-24: qty 1

## 2013-06-24 MED ORDER — CALCIUM CARBONATE ANTACID 500 MG PO CHEW: 2.0000 | CHEWABLE_TABLET | Freq: Three times a day (TID) | ORAL | Status: DC | PRN

## 2013-06-24 NOTE — Progress Notes (Signed)
Assisted with initiation of Bipap with RT for patient with elevated pCO2.  Patient alert = some confusion per RN Morrie Sheldon - co-operative - tol BiPAP - had eaten some grapes about 30 mins ago - no nausea per report - abd soft - patient tol BiPAP - she has used it before.  VSS.  NAD.  Mod tachypnea - states she is a little tired but had HD today.  Transferred to [facility] without incident - handoff to [facility].

## 2013-06-24 NOTE — Discharge Summary (Deleted)
Physician Discharge Summary  [name] ZOX:[id number] DOB: 07-Apr-1941 DOA: 06/19/2013  PCP: [name], MD  Admit date: 06/19/2013 Discharge date: 06/24/2013  Time spent: 45 minutes  Recommendations for Outpatient Follow-up:  1. Followup with Dr. Munley/Cardiologist on Monday, September 29. When you call to make appointment please inform the office that you have been started on Coumadin and at least need to have blood work checked this Monday. 2. Followup with the dialysis center as prior to admission. 3. Followup with your current care physician for routine examination post recent hospitalization  CODE STATUS: During the hospitalization the patient changed herself to a partial code, does not want CPR, defibrillation, antiarrhythmics or vasoactive medications. She is amenable to intubation her  Discharge Diagnoses:  Active Problems:   CKD (chronic kidney disease) stage V requiring chronic dialysis   Acute respiratory failure with hypoxia due to Acute pulmonary edema-resolved   DM2 (diabetes mellitus, type 2)   C. difficile colitis-recurrent on protracted oral vancomycin taper   HTN (hypertension)   COPD (chronic obstructive pulmonary disease)   Chronic respiratory failure with hypoxia   Pulmonary HTN   OSA (obstructive sleep apnea)   Atrial fibrillation-new start chronic anticoagulation   Discharge Condition: stable  Diet recommendation: Renal  Filed Weights   06/24/13 0648 06/24/13 0712 06/24/13 1140  Weight: 82.7 kg (182 lb 5.1 oz) 82.7 kg (182 lb 5.1 oz) 81 kg (178 lb 9.2 oz)    History of present illness:  72 y.o. female who presented initially to [facility]. She had markedly elevated BP's in the ED there with SBPs of 260, acute respiratory failure on BIPAP, was encephalopathic, and in severe pulmonary edema. She was put on Cardene gtt initially as well as given labetalol and NTG paste. She was transferred to [facility]. By the time she arrived to [facility], she was  off the BiPAP, off the cardene gtt, was normotensive, and had normal mentation. CCM asked medicine to admit patient given she no longer required ICU level   Hospital Course:  Acute respiratory failure with hypoxia / Acute pulmonary edema  -improved after medical rxn  -Possibly precipitated by recurrent atrial fibrillation with RVR while at home noting patient does not have any awareness of tachypalpitations  Recurrent atrial fibrillation with RVR - hx of Atrial Fib diagnosed April 2013  Pt had developed tachycardia post HD (rates 140-160) but tolerated well - denied a prior hx of Afib, but records suggested was diagnosed w/ same in [facility] 2013 - must be paroxysmal in occurence as pt has been in NSR majority of hospital stay - full anticoag started 9/24 given CHADVAsc 5 - attempted to control with one time IV cardizem dose followed by oral cardizem but had RVR overnight into 9/25-currently in NSR -TSH 1.629- of note, pt is on coreg only as outpt and since admit discovered "insurance nurse" decreased dose from 12.5 to 6.25 prior to admit- Cardiology recommended long-term anticoagulation and patient at this point is amenable to proceeding-rates well controlled on Cardizem 60 mg 4 times a day and this has been transitioned to [employer] XT 240 mg daily-patient will need to hold this medication on the a.m. of dialysis once dialysis session is complete-in addition carvedilol was also continued for rate control on this medication should be held on morning of dialysis as well and this can also be taken once dialysis treatment is completed.  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 Vanco and Xifaxan)  -has had 3 positive C diff collections: 07/30/12, 10/23/12, and 06/20/13  -ID consulted/ recommend prolonged taper PO Vanco over 6 weeks:  125mg  qid for 1 week -last 4 times a day dose to be on 9/27-(discharge prescription/AVS with detailed information regarding remainder of taper) 125mg  BID for 1 week  125mg  qday for 1 week  125mg  qod for 1 week  125mg  q3days for 14 days  -Rifaxan dc'd 9/23  -CM made aware need to clarify if pre auth needed for PO Vanco   HTN (hypertension)  -follow w/ increase in coreg and addition of CCB   Chronic respiratory failure with hypoxia due to:  A) COPD (chronic obstructive pulmonary disease)  B) Pulmonary HTN  C) OSA (obstructive sleep apnea)  -chronic O2 at home -2-3 L per minute -non compliant with CPAP   Procedures:  None  Consultations:  Cardiology  Nephrology  Discharge Exam: Filed Vitals:   06/24/13 1246  BP: 166/66  Pulse: 84  Temp: 98.9 F (37.2 C)  Resp: 20   General: No acute respiratory distress at rest  Lungs: Clear to auscultation bilaterally without wheezes or crackles-chronic O2  Cardiovascular: tachycardic - irreg irreg - no appreciable gallup/rub  Abdomen: nontender, nondistended, soft, bowel sounds positive, no rebound, no ascites, no appreciable mass  Musculoskeletal: No significant cyanosis, clubbing of bilateral lower extremities   Discharge Instructions       Future Appointments Provider Department Dept Phone   08/30/2013 2:30 PM [facility] [place] [facility] [address] [phone]   08/30/2013 3:40 PM Carma Lair Nickel, NP [facility] [phone]       Medication List         albuterol (2.5 MG/3ML) 0.083% nebulizer solution  Commonly known as:  PROVENTIL  Take 2.5 mg by nebulization 2 (two) times daily.     calcium acetate 667 MG capsule  Commonly known as:  PHOSLO  Take 1,334 mg by mouth 3 (three) times daily with meals.     calcium  carbonate 500 MG chewable tablet  Commonly known as:  TUMS - dosed in mg elemental calcium  Chew 2 tablets (400 mg of elemental calcium total) by mouth 3 (three) times daily as needed for heartburn.     carvedilol 12.5 MG tablet  Commonly known as:  COREG  Take 1 tablet (12.5 mg total) by mouth 2 (two) times daily with a meal. Hold this medication on the morning of dialysis but once dialysis treatment complete he may take later in the afternoon after dialysis treatment complete     diltiazem 240 MG 24 hr capsule  Commonly known as:  CARTIA XT  Take 1 capsule (240 mg total) by mouth daily. Please hold this medication on the morning of dialysis. He may take this medication watch her dialysis session is complete.     ezetimibe 10 MG tablet  Commonly known as:  ZETIA  Take 10 mg by mouth at bedtime.     fluticasone 110 MCG/ACT inhaler  Commonly known as:  FLOVENT HFA  Inhale 2 puffs into the lungs 3 (three) times daily as needed (for wheezing and shortness of breath).  glipiZIDE 5 MG tablet  Commonly known as:  GLUCOTROL  Take 5 mg by mouth daily as needed (if CBG is >150).     ipratropium 0.02 % nebulizer solution  Commonly known as:  ATROVENT  Take 500 mcg by nebulization every 6 (six) hours as needed for wheezing.     rOPINIRole 1 MG tablet  Commonly known as:  REQUIP  Take 1 mg by mouth 2 (two) times daily.     vancomycin 50 mg/mL oral solution  Commonly known as:  VANCOCIN  - 125mg  4 times a day for 1 week (until 9/27)  - 125mg  twice daily for 1 week  (9/28 - 10/4)  - 125mg  once daily for 1 week  (10/5 - 10/12)  - 125mg  every other day for 1 week  (10/13 - 1-/20)   - 125mg  every 3 days for 14 days (10/21 - 11/5)     warfarin 5 MG tablet  Commonly known as:  COUMADIN  Take one tab daily at supper or as adjusted by [facility]madin Clinic       Allergies  Allergen Reactions  . Morphine And Related Nausea And Vomiting  . Lipitor [Atorvastatin] Other (See Comments)     Causes drowsiness  . Nsaids     Unknown reported by previous hospital   Follow-up Information   Follow up with [name], MD.   Specialty:  Internal Medicine   Contact information:   [address] [place] [address] [phone]       Schedule an appointment as soon as possible for a visit with [facility], MD. (He be seen this Monday to have PT INR checked since new start on Coumadin and to discuss recent atrial fibrillation)    Specialty:  Cardiology   Contact information:   [address] [place] [address] [phone]        The results of significant diagnostics from this hospitalization (including imaging, microbiology, ancillary and laboratory) are listed below for reference.    Significant Diagnostic Studies: Dg Chest Port 1 View  06/21/2013   CLINICAL DATA:  Followup pulmonary edema  EXAM: PORTABLE CHEST - 1 VIEW  COMPARISON:  06/19/2013  FINDINGS: Irregular interstitial thickening has mildly improved. Hazy perihilar airspace opacity has resolved.  The cardiac silhouette is mildly enlarged. No mediastinal or hilar masses are noted.  IMPRESSION: Improved pulmonary edema.   Electronically Signed   By: Amie Portland   On: 06/21/2013 13:52   Dg Abd Portable 1v  06/21/2013   *RADIOLOGY REPORT*  Clinical Data: Abdominal discomfort.  Possible colonic thickening.  PORTABLE ABDOMEN - 1 VIEW  Comparison:  None.  Findings: The bowel gas pattern is normal.  No radio-opaque calculi or other significant radiographic abnormality are seen. Advanced vascular calcification.  Surgical clips right mid abdomen.  IMPRESSION: Negative for acute abnormality.   Original Report Authenticated By: Davonna Belling, M.D.    Microbiology: Recent Results (from the past 240 hour(s))  MRSA PCR SCREENING     Status: None   Collection Time    06/19/13  9:01 PM      Result Value Range Status   MRSA by PCR NEGATIVE  NEGATIVE Final   Comment:            The GeneXpert MRSA Assay (FDA      approved for NASAL specimens     only), is one component of a     comprehensive MRSA colonization     surveillance program. It is not  intended to diagnose MRSA     infection nor to guide or     monitor treatment for     MRSA infections.  CLOSTRIDIUM DIFFICILE BY PCR     Status: Abnormal   Collection Time    06/20/13 11:18 AM      Result Value Range Status   C difficile by pcr POSITIVE (*) NEGATIVE Final   Comment: CRITICAL RESULT CALLED TO, READ BACK BY AND VERIFIED WITH:     Aretha Parrot RN 14:25 06/20/13 ([email or web address])     Labs: Basic Metabolic Panel:  Recent Labs Lab 06/19/13 2221 06/20/13 0545 06/22/13 0701 06/23/13 0150 06/24/13 0721  NA 138 138 140 139 134*  K 4.6 4.7 5.1 4.7 5.3*  CL 98 99 101 102 96  CO2 26 24 26 26 27   GLUCOSE 139* 86 94 101* 103*  BUN 61* 68* 47* 27* 47*  CREATININE 7.44* 8.12* 6.38* 4.09* 6.38*  CALCIUM 8.9 8.8 8.5 8.4 8.9  MG  --   --   --  2.3  --   PHOS  --   --  6.3*  --  6.4*   Liver Function Tests:  Recent Labs Lab 06/19/13 2221 06/22/13 0701 06/24/13 0721  AST 24  --   --   ALT 15  --   --   ALKPHOS 78  --   --   BILITOT 0.4  --   --   PROT 6.7  --   --   ALBUMIN 3.2* 3.0* 2.9*   No results found for this basename: LIPASE, AMYLASE,  in the last 168 hours No results found for this basename: AMMONIA,  in the last 168 hours CBC:  Recent Labs Lab 06/19/13 2221 06/20/13 0545 06/22/13 0709 06/23/13 0150 06/24/13 0721  WBC 14.8* 14.5* 11.6* 9.8 13.5*  HGB 11.2* 11.1* 10.1* 10.4* 9.8*  HCT 37.5 37.1 34.9* 36.2 33.7*  MCV 107.8* 106.9* 109.1* 110.0* 109.8*  PLT 194 248 166 175 176   Cardiac Enzymes:  Recent Labs Lab 06/20/13 1300 06/20/13 2056 06/21/13 0350  TROPONINI <0.30 <0.30 <0.30   BNP: BNP (last 3 results)  Recent Labs  08/19/12 2020 10/19/12 1605 04/13/13 1644  PROBNP 10460.0* 17356.0* 12692.0*   CBG:  Recent Labs Lab 06/23/13 0748 06/23/13 1208 06/23/13 1723 06/23/13 2137 06/24/13 1241   GLUCAP 124* 131* 96 129* 87       Signed:  ELLIS,ALLISON L. ANP [facility] 06/24/2013, 3:36 PM

## 2013-06-24 NOTE — Progress Notes (Signed)
Subjective:  In bed comfortable, no SOB.   Objective:  Vital Signs in the last 24 hours: Temp:  [97.7 F (36.5 C)-98.9 F (37.2 C)] 98.9 F (37.2 C) (09/26 1246) Pulse Rate:  [51-84] 84 (09/26 1246) Resp:  [18-27] 20 (09/26 1246) BP: (86-179)/(27-66) 166/66 mmHg (09/26 1246) SpO2:  [95 %-100 %] 98 % (09/26 1246) Weight:  [80 kg (176 lb 5.9 oz)-82.7 kg (182 lb 5.1 oz)] 81 kg (178 lb 9.2 oz) (09/26 1140)  Intake/Output from previous day: 09/25 0701 - 09/26 0700 In: 869.6 [P.O.:500; I.V.:369.6] Out: 60 [Urine:60]   Physical Exam: General: Well developed, well nourished, in no acute distress. Head:  Normocephalic and atraumatic. Lungs: Decreased BS Bilat.  Heart: Normal S1 and S2.  No murmur, rubs or gallops.  Abdomen: soft, non-tender, positive bowel sounds. Obese Extremities: No clubbing or cyanosis. No edema. Neurologic: Alert and oriented x 3.    Lab Results:  Recent Labs  06/23/13 0150 06/24/13 0721  WBC 9.8 13.5*  HGB 10.4* 9.8*  PLT 175 176    Recent Labs  06/23/13 0150 06/24/13 0721  NA 139 134*  K 4.7 5.3*  CL 102 96  CO2 26 27  GLUCOSE 101* 103*  BUN 27* 47*  CREATININE 4.09* 6.38*   No results found for this basename: TROPONINI, CK, MB,  in the last 72 hours Hepatic Function Panel  Recent Labs  06/24/13 0721  ALBUMIN 2.9*   No results found for this basename: CHOL,  in the last 72 hours No results found for this basename: PROTIME,  in the last 72 hours Telemetry: NSR. No AFIB Personally viewed.    Assessment/Plan:  Active Problems:   CKD (chronic kidney disease) stage V requiring chronic dialysis   DM2 (diabetes mellitus, type 2)   COPD (chronic obstructive pulmonary disease)   Acute respiratory failure with hypoxia   Acute pulmonary edema   C. difficile colitis   Chronic respiratory failure with hypoxia   HTN (hypertension)   Pulmonary HTN   OSA (obstructive sleep apnea)   Atrial fibrillation  -Now in NSR -Agree with [name],  agree that anticoagulation is indicated. -Additionally, if it is felt that episodes of atrial fibrillation , precipitate acute pulmonary edema, rhythm control with amiodarone would be a consideration.  -Aggressive dialysis to prevent blood pressure elevation and pulmonary edema She does have a personal cardiologist -- [name] in [place]. This should be coordinated with him.   [name] 06/24/2013, 3:41 PM

## 2013-06-24 NOTE — Progress Notes (Signed)
ANTICOAGULATION CONSULT NOTE - Follow Up Consult  Pharmacy Consult for heparin Indication: atrial fibrillation  Labs:  Recent Labs  06/21/13 0350 06/22/13 0701 06/22/13 0709 06/23/13 0150 06/23/13 1207 06/24/13 0035  HGB  --   --  10.1* 10.4*  --   --   HCT  --   --  34.9* 36.2  --   --   PLT  --   --  166 175  --   --   HEPARINUNFRC  --   --   --  0.35 <0.10* 0.14*  CREATININE  --  6.38*  --  4.09*  --   --   TROPONINI <0.30  --   --   --   --   --     Assessment: 72yo female remains subtherapeutic on heparin after rate increase.  Goal of Therapy:  Heparin level 0.3-0.7 units/ml   Plan:  Will rebolus with heparin 1000 units x1 and increase gtt by 3 units/kg/hr to 1400 units/hr and check level in 8hr.  Vernard Gambles, PharmD, BCPS  06/24/2013,1:41 AM

## 2013-06-24 NOTE — Progress Notes (Signed)
Renal Service Daily Progress Note [facility]  Subjective: Feels tired today, on HD  Physical Exam:  Blood pressure 108/52, pulse 67, temperature 97.7 F (36.5 C), temperature source Oral, resp. rate 25, height 5\' 4"  (1.626 m), weight 82.7 kg (182 lb 5.1 oz), SpO2 99.00%. Gen: elderly, chronically ill-appearing, NAD, more drowsy today than yesterday Heart: RRR no rub or gallop Lungs: occ ant wheezes, post lung fields clear Abdomen: soft, NT, obese, + BS Extremities: No LE edema Access: R AVF + bruit  Outpatient HD: (MWF [facility])  4hr F160 400/1.5 81kg 2K/2.5Ca RUA AVF No heparin  Hectorol none EPO 7400 Venofer 50mg /wk  Assessment/Plan: 1. Dyspnea / pulm edema - resolved 2. Paroxsymal Atrial Fib- on IV heparin, cardiology has seen, back in NSR  3. ESRD cont HD MWF 4. Anemia Hb 9.8 < 11.6, Resume ESA with HD tomorrow if still admitted. Incr op Epo. 5. Metabolic bone disease - Ca 8.4 (9.8 corrected) cont binders, no vit D 6. HTN/volume- will try to get to 80kg today if BP tolerates 7. Hx recurrent C. diff - on PO vanc per pharmacy, taper over 6 wks per ID 8. Hx COPD on home O2 9. DM 2- glipizide 10. Dispo- per primary  Vinson Moselle  MD Pager [phone]    Cell  [phone] 06/24/2013, 9:53 AM    Recent Labs Lab 06/22/13 0701 06/23/13 0150 06/24/13 0721  NA 140 139 134*  K 5.1 4.7 5.3*  CL 101 102 96  CO2 26 26 27   GLUCOSE 94 101* 103*  BUN 47* 27* 47*  CREATININE 6.38* 4.09* 6.38*  CALCIUM 8.5 8.4 8.9  PHOS 6.3*  --  6.4*    Recent Labs Lab 06/19/13 2221 06/22/13 0701 06/24/13 0721  AST 24  --   --   ALT 15  --   --   ALKPHOS 78  --   --   BILITOT 0.4  --   --   PROT 6.7  --   --   ALBUMIN 3.2* 3.0* 2.9*    Recent Labs Lab 06/22/13 0709 06/23/13 0150 06/24/13 0721  WBC 11.6* 9.8 13.5*  HGB 10.1* 10.4* 9.8*  HCT 34.9* 36.2 33.7*  MCV 109.1* 110.0* 109.8*  PLT 166 175 176   . calcium acetate  1,334 mg Oral TID WC  . carvedilol  12.5  mg Oral BID WC  . darbepoetin      . darbepoetin (ARANESP) injection - DIALYSIS  60 mcg Intravenous Q Fri-HD  . diltiazem  60 mg Oral Q6H  . docusate sodium  100 mg Oral BID  . ezetimibe  10 mg Oral QHS  . ferric gluconate (FERRLECIT/NULECIT) IV  125 mg Intravenous Q Fri-HD  . fluticasone  1 puff Inhalation BID  . insulin aspart  0-9 Units Subcutaneous TID WC  . LORazepam  0.5 mg Intravenous Once  . rOPINIRole  1 mg Oral BID WC  . sodium chloride  3 mL Intravenous Q12H  . vancomycin  125 mg Oral QID   . heparin 1,400 Units/hr (06/24/13 0149)   sodium chloride, sodium chloride, acetaminophen, acetaminophen, calcium carbonate, feeding supplement (NEPRO CARB STEADY), heparin, levalbuterol, lidocaine (PF), lidocaine-prilocaine, ondansetron (ZOFRAN) IV, ondansetron, pentafluoroprop-tetrafluoroeth

## 2013-06-24 NOTE — Progress Notes (Signed)
[facility] Progress Note [name]   [name] ZOX:[id number] DOB: 1940-11-22 DOA: 06/19/2013 PCP: [name], MD  Brief narrative: 72 y.o. female who presented initially to [facility]. She had markedly elevated BP's in the ED there with SBPs of 260, acute respiratory failure on BIPAP, was encephalopathic, and in severe pulmonary edema. She was put on Cardene gtt initially as well as given labetalol and NTG paste. She was transferred to [facility] at [facility]. By the time she arrived to [facility], she was off the BiPAP, off the cardene gtt, was normotensive, and had normal mentation. CCM  asked medicine to admit patient given she no longer required ICU level.  Assessment/Plan:  Acute respiratory failure with hypoxia / Acute pulmonary edema -improved after medical rxn -continue chronic O2  Recurrent AF - hx of Atrial Fib diagnosed April 2013  Pt had developed tachycardia today post HD (rates 140-160) but tolerated well - denied a prior hx of Afib, but records suggested was diagnosed w/ same in [facility] 2013 - must be paroxysmal in occurence as pt has been in NSR majority of hospital stay - full anticoag started 9/24 given CHADVAsc 5 - attempted to control with one time IV cardizem dose followed by oral cardizem but had RVR overnight into 9/25-currently in NSR -TSH 1.629- of note, pt is on coreg only as outpt and since admit discovered "insurance nurse" decreased dose from 12.5 to 6.25 prior to admit-pt reluctant to proceed with Coumadin- consult Cardiology   Confusion/mild delirium -new today after HD- was not like this while in HD -with h/o COPD will check ABG to r/o hypercarbia and check PCXR  Chest pain -EKG with nonspecific ST changes/similar to July 2014 EKG -enzymes negative x 3 -sx have resolved   CKD (chronic kidney disease) stage V requiring chronic dialysis -per renal  DM2 (diabetes mellitus, type 2) -CBG controlled - no change in tx plan today    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 Vanco and Xifaxan) -has had 3 positive C diff collections: 07/30/12, 10/23/12, and 06/20/13 -ID consulted/ recommend prolonged taper PO Vanco over 6 weeks: 125mg  qid for 1 week  125mg  BID for 1 week  125mg  qday for 1 week  125mg  qod for 1 week  125mg  q3days for 14 days -Rifaxan dc'd 9/23 -CM made aware need to clarify if pre auth needed for PO Vanco-9/26: insurance WILL NOT PAY at all for PO Vanco- in the least will need a letter of appeal!!- may need to consider SNF for rehab and completes Vanco  Deconditioning -PT/OT eval -expect will need HHRN,PT- face to face completed  HTN (hypertension) -follow w/ increase in coreg and addition of CCB  Chronic respiratory failure with hypoxia due to:   A) COPD (chronic obstructive pulmonary disease)   B) Pulmonary HTN   C) OSA (obstructive sleep apnea) -chronic O2 at home -non compliant with CPAP  DVT prophylaxis: full dose lovenox  Code Status: Full Family Communication: no family present at time of exam  Disposition Plan/Expected LOS: Telemetry Isolation: Contact isolation for MRSA PCR positive status as well as recent diagnosis C. difficile colitis  Consultants: Nephrology ID  Procedures: None  Antibiotics: Oral vancomycin 9/21 >>> Xifaxan 9/21 >>>9/23  HPI/Subjective: Examined earlier in HD- alert and eager to dc home- Later examined on floor and increased confusion and more greyish in appearance- daughter agress pt not herself  Objective: Blood  pressure 158/52, pulse 81, temperature 98.9 F (37.2 C), temperature source Oral, resp. rate 20, height 5\' 4"  (1.626 m), weight 81 kg (178 lb 9.2 oz), SpO2 96.00%.  Intake/Output Summary (Last 24 hours) at 06/24/13 1622 Last data filed at 06/24/13 1300  Gross per 24 hour  Intake 849.64 ml  Output   2860 ml  Net -2010.36 ml   Exam: General: No acute respiratory distress at  rest-more confused this afternoon Lungs: Clear to auscultation bilaterally without wheezes or crackles-chronic O2 Cardiovascular: tachycardic - irreg irreg - no appreciable gallup/rub Abdomen: nontender, nondistended, soft, bowel sounds positive, no rebound, no ascites, no appreciable mass Musculoskeletal: No significant cyanosis, clubbing of bilateral lower extremities   Scheduled Meds:  Scheduled Meds: . calcium acetate  1,334 mg Oral TID WC  . carvedilol  12.5 mg Oral BID WC  . darbepoetin (ARANESP) injection - DIALYSIS  60 mcg Intravenous Q Fri-HD  . diltiazem  60 mg Oral Q6H  . docusate sodium  100 mg Oral BID  . ezetimibe  10 mg Oral QHS  . ferric gluconate (FERRLECIT/NULECIT) IV  125 mg Intravenous Q Fri-HD  . fluticasone  1 puff Inhalation BID  . insulin aspart  0-9 Units Subcutaneous TID WC  . LORazepam  0.5 mg Intravenous Once  . rOPINIRole  1 mg Oral BID WC  . sodium chloride  3 mL Intravenous Q12H  . vancomycin  125 mg Oral QID    Data Reviewed: Basic Metabolic Panel:  Recent Labs Lab 06/19/13 2221 06/20/13 0545 06/22/13 0701 06/23/13 0150 06/24/13 0721  NA 138 138 140 139 134*  K 4.6 4.7 5.1 4.7 5.3*  CL 98 99 101 102 96  CO2 26 24 26 26 27   GLUCOSE 139* 86 94 101* 103*  BUN 61* 68* 47* 27* 47*  CREATININE 7.44* 8.12* 6.38* 4.09* 6.38*  CALCIUM 8.9 8.8 8.5 8.4 8.9  MG  --   --   --  2.3  --   PHOS  --   --  6.3*  --  6.4*   Liver Function Tests:  Recent Labs Lab 06/19/13 2221 06/22/13 0701 06/24/13 0721  AST 24  --   --   ALT 15  --   --   ALKPHOS 78  --   --   BILITOT 0.4  --   --   PROT 6.7  --   --   ALBUMIN 3.2* 3.0* 2.9*   CBC:  Recent Labs Lab 06/19/13 2221 06/20/13 0545 06/22/13 0709 06/23/13 0150 06/24/13 0721  WBC 14.8* 14.5* 11.6* 9.8 13.5*  HGB 11.2* 11.1* 10.1* 10.4* 9.8*  HCT 37.5 37.1 34.9* 36.2 33.7*  MCV 107.8* 106.9* 109.1* 110.0* 109.8*  PLT 194 248 166 175 176   Cardiac Enzymes:  Recent Labs Lab  06/20/13 1300 06/20/13 2056 06/21/13 0350  TROPONINI <0.30 <0.30 <0.30   BNP (last 3 results)  Recent Labs  08/19/12 2020 10/19/12 1605 04/13/13 1644  PROBNP 10460.0* 17356.0* 12692.0*   CBG:  Recent Labs Lab 06/23/13 0748 06/23/13 1208 06/23/13 1723 06/23/13 2137 06/24/13 1241  GLUCAP 124* 131* 96 129* 87    Recent Results (from the past 240 hour(s))  MRSA PCR SCREENING     Status: None   Collection Time    06/19/13  9:01 PM      Result Value Range Status   MRSA by PCR NEGATIVE  NEGATIVE Final   Comment:            The GeneXpert MRSA  Assay (FDA     approved for NASAL specimens     only), is one component of a     comprehensive MRSA colonization     surveillance program. It is not     intended to diagnose MRSA     infection nor to guide or     monitor treatment for     MRSA infections.  CLOSTRIDIUM DIFFICILE BY PCR     Status: Abnormal   Collection Time    06/20/13 11:18 AM      Result Value Range Status   C difficile by pcr POSITIVE (*) NEGATIVE Final   Comment: CRITICAL RESULT CALLED TO, READ BACK BY AND VERIFIED WITH:     Aretha Parrot RN 14:25 06/20/13 ([email or web address])     Studies:  Recent x-ray studies have been reviewed in detail by the Attending Physician  Junious Silk, ANP [facility] Office  [phone] Pager [phone]  On-Call/Text Page:      [email or web address]      password [email or web address]  If 7PM-7AM, please contact night-coverage [email or web address] Password [email or web address] 06/24/2013, 4:22 PM   LOS: 5 days   Addendum: ABG reveals hypercarbic resp failure.  I have examined the patient, reviewed the chart and agree with the above note.Transfer to SDU for BiPAP. Repeat ABG in 3 hrs.    Zhaire Locker,MD [phone] 06/24/2013, 4:57 PM

## 2013-06-24 NOTE — Care Management Note (Signed)
   CARE MANAGEMENT NOTE 06/24/2013  Patient:  [name],[name]   Account Number:  [id number]  Date Initiated:  06/21/2013  Documentation initiated by:  [facility]  Subjective/Objective Assessment:   HTN crisis     Action/Plan:   06/24/13 Pt for possible d/c on Vancomycin solution, however with benefits check pt insurance will not cover. Pt not stable for d/c, will reeval need for meds later.   Anticipated DC Date:  06/28/2013   Anticipated DC Plan:  HOME W HOME HEALTH SERVICES      DC Planning Services  CM consult      Choice offered to / List presented to:             Status of service:  In process, will continue to follow Medicare Important Message given?   (If response is "NO", the following Medicare IM given date fields will be blank) Date Medicare IM given:   Date Additional Medicare IM given:    Discharge Disposition:    Per UR Regulation:  Reviewed for med. necessity/level of care/duration of stay  If discussed at Long Length of Stay Meetings, dates discussed:    Comments:    06/24/2013 Pt insurance would not cover Vancomycin Solution, would require letter to insurance provider requesting evaluation and possible approval. However pt has declined some and is not ready for d/c at this time, will reeval for needs over the weekend. Johny Shock RN MPH, case manager, [phone]  06-21-13 3:45pm Avie Arenas, RNBSN [phone]   According to [employer] [facility] Rifaxim only comes in 200 mg which the patient would pay 15% of the total cost of the drug. They did not have that cost.  Vancomycin Soultion only comes in 25 or 50 mg and that would be Cook capsule.  This would be the same price since the member has low income subsidy.  Prior Berkley Harvey is required.  Please call [phone].

## 2013-06-24 NOTE — Progress Notes (Signed)
ANTICOAGULATION CONSULT NOTE - Follow Up Consult  Pharmacy Consult for Heparin  Indication: atrial fibrillation  Allergies  Allergen Reactions  . Morphine And Related Nausea And Vomiting  . Lipitor [Atorvastatin] Other (See Comments)    Causes drowsiness  . Nsaids     Unknown reported by previous hospital    Patient Measurements: Height: 5\' 4"  (162.6 cm) Weight: 182 lb 5.1 oz (82.7 kg) IBW/kg (Calculated) : 54.7 Heparin Dosing Weight:   Vital Signs: Temp: 97.7 F (36.5 C) (09/26 0648) Temp src: Oral (09/26 0648) BP: 108/52 mmHg (09/26 0830) Pulse Rate: 67 (09/26 0830)  Labs:  Recent Labs  06/22/13 0701  06/22/13 0709  06/23/13 0150 06/23/13 1207 06/24/13 0035 06/24/13 0721  HGB  --   < > 10.1*  --  10.4*  --   --  9.8*  HCT  --   --  34.9*  --  36.2  --   --  33.7*  PLT  --   --  166  --  175  --   --  176  HEPARINUNFRC  --   --   --   < > 0.35 <0.10* 0.14* 0.40  CREATININE 6.38*  --   --   --  4.09*  --   --  6.38*  < > = values in this interval not displayed.  Estimated Creatinine Clearance: 8.4 ml/min (by C-G formula based on Cr of 6.38).   Medications:  Scheduled:  . calcium acetate  1,334 mg Oral TID WC  . carvedilol  12.5 mg Oral BID WC  . darbepoetin (ARANESP) injection - DIALYSIS  60 mcg Intravenous Q Fri-HD  . diltiazem  60 mg Oral Q6H  . docusate sodium  100 mg Oral BID  . ezetimibe  10 mg Oral QHS  . ferric gluconate (FERRLECIT/NULECIT) IV  125 mg Intravenous Q Fri-HD  . fluticasone  1 puff Inhalation BID  . insulin aspart  0-9 Units Subcutaneous TID WC  . LORazepam  0.5 mg Intravenous Once  . rOPINIRole  1 mg Oral BID WC  . sodium chloride  3 mL Intravenous Q12H  . vancomycin  125 mg Oral QID    Assessment: 72 y.o. Female with heparin for A.Fib. Heparin level is therapeutic at 0.4 on heparin 1400 units. No change in rate necessary. Continue daily HL and CBC while on heparin. Goal of Therapy:  Heparin level 0.3-0.7 units/ml Monitor  platelets by anticoagulation protocol: Yes   Plan:  Will continue heparin at 1400 units/hr. Daily HL and CBC.  Oral anticoagulation will be needed and will be managed by her cardiologis.  [name] 06/24/2013,8:55 AM

## 2013-06-24 NOTE — Progress Notes (Signed)
CRITICAL VALUE ALERT  Critical value received:  ABG CO2 of 74  Date of notification:  06/24/13  Time of notification:  16:50  Critical value read back:yes  Nurse who received alert:  Dianna Limbo  MD notified (1st page):  Rizwan  Time of first page:  16:55  MD notified (2nd page):   Time of second page:  Responding MD:  Butler Denmark  Time MD responded:  16:57

## 2013-06-24 NOTE — Consult Note (Signed)
PHARMACY CONSULT NOTE  Pharmacy Consult:  Add Coumadin [with Heparin bridging, patient already on Heparin infusion ] Indication: atrial fibrillation  Active Problems: Active Problems:   CKD (chronic kidney disease) stage V requiring chronic dialysis   DM2 (diabetes mellitus, type 2)   COPD (chronic obstructive pulmonary disease)   Acute respiratory failure with hypoxia   Acute pulmonary edema   C. difficile colitis   Chronic respiratory failure with hypoxia   HTN (hypertension)   Pulmonary HTN   OSA (obstructive sleep apnea)   Atrial fibrillation   Labs:  Recent Labs  06/22/13 0701 06/22/13 0709  06/23/13 0150 06/23/13 1207 06/24/13 0035 06/24/13 0721 06/24/13 1800  HGB  --  10.1*  --  10.4*  --   --  9.8*  --   HCT  --  34.9*  --  36.2  --   --  33.7*  --   PLT  --  166  --  175  --   --  176  --   LABPROT  --   --   --   --   --   --   --  12.5  INR  --   --   --   --   --   --   --  0.95  HEPARINUNFRC  --   --   < > 0.35 <0.10* 0.14* 0.40  --   CREATININE 6.38*  --   --  4.09*  --   --  6.38*  --   < > = values in this interval not displayed. Lab Results  Component Value Date   INR 0.95 06/24/2013   INR 1.03 09/22/2012   INR 0.99 12/18/2009   Estimated Creatinine Clearance: 8.4 ml/min (by C-G formula based on Cr of 6.38).  Medical / Surgical History: Past Medical History  Diagnosis Date  . Carotid artery occlusion     bilat CEA in 2012?  . C. 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 Hypertension   . Atrial fibrillation April  2013  . Pulmonary hypertension   . Secondary hyperparathyroidism, renal   . COPD (chronic obstructive pulmonary disease)   . Hypercholesterolemia   . History of MI (myocardial infarction)     "they say I had 2 years ago" (08/20/2012)  . GERD (gastroesophageal reflux disease)   . History of pneumonia     "have it alot" (08/20/2012)  . Sleep apnea     "don't wear mask anymore"  . Type II diabetes mellitus   .  History of blood transfusion 2013    "3 times so far in 2013:  4 pints one time; 2 pints another; ?# last time" (08/20/2012)  . Iron deficiency anemia   . Seizures 2012    after carotid surgery  . ESRD on dialysis     "[facility]; Tues, Thurs; Sat"  . Renal cell carcinoma 2005?  Marland Kitchen On home oxygen therapy     "24/7"   . Acute diverticulitis     Oct 2013, treated medically [facility], sigmoid diverticulitits  . CHF (congestive heart failure)    Past Surgical History  Procedure Laterality Date  . Nephrectomy  2005    Right, for Renal cell carcinoma  . Carotid endarterectomy  2012    bilaterally  . Av fistula placement  Jan. 7, 2011    Right  upper arm by Dr. Hart [place]  . Hd catheter  Aug. 22, 2013    [facility]  .  Appendectomy      childhood  . Abdominal hysterectomy  1971?  Marland Kitchen Coronary angioplasty with stent placement  1990's    "1 total"  . Coronary angioplasty  1990's  . Av fistula repair  2013    right upper arm  . Esophagogastroduodenoscopy N/A 12/29/2012    Procedure: ESOPHAGOGASTRODUODENOSCOPY (EGD);  Surgeon: Petra Kuba, MD;  Location: [facility] ENDOSCOPY;  Service: Endoscopy;  Laterality: N/A;  . Colonoscopy with propofol N/A 12/31/2012    Procedure: COLONOSCOPY WITH PROPOFOL;  Surgeon: Petra Kuba, MD;  Location: [facility] ENDOSCOPY;  Service: Endoscopy;  Laterality: N/A;  currently IP at [facility] 3307    Medications:  Scheduled:  . calcium acetate  1,334 mg Oral TID WC  . carvedilol  12.5 mg Oral BID WC  . darbepoetin (ARANESP) injection - DIALYSIS  60 mcg Intravenous Q Fri-HD  . diltiazem  60 mg Oral Q6H  . docusate sodium  100 mg Oral BID  . ezetimibe  10 mg Oral QHS  . ferric gluconate (FERRLECIT/NULECIT) IV  125 mg Intravenous Q Fri-HD  . fluticasone  1 puff Inhalation BID  . insulin aspart  0-9 Units Subcutaneous TID WC  . LORazepam  0.5 mg Intravenous Once  . rOPINIRole  1 mg Oral BID WC  . sodium chloride  3 mL Intravenous Q12H  . vancomycin  125 mg Oral QID    Infusions:  . heparin 1,400 Units/hr (06/24/13 1800)    Assessment:  71 y.o.female continuing with Heparin infusion for atrial fibrillation.  Coumadin will be started for long-term VTE prophylaxis.   Baseline INR 0.95, Last Heparin level within therapeutic range 0.4 units/ml.  No bleeding complications noted  Coumadin Predictor Score = 3.   Goal of Therapy:   INR 2-3  Heparin level 0.3-0.7 units/ml      Plan:   Continue Heparin bridging until INR therapeutic.  Coumadin 5 mg today. Daily Heparin Levels, INR, CBC.  Monitor for bleeding complications.   Kaidence Sant, Elisha Headland,  Pharm.D.. 06/24/2013,  6:48 PM

## 2013-06-24 NOTE — Progress Notes (Addendum)
Patient transferred from 5W to 3S.  Patient with labored breathing and periods of confusion.  ABG checked with elevated CO2.  Bipap orders placed along with transfer to stepdown unit.  Report called to [employer].  RRT placed patient on Bipap.  Ambu bag with patient for transfer. Medications sent with patient.  Daughter [name] called and notified of transfer.

## 2013-06-24 NOTE — Progress Notes (Signed)
Placed pt on BIPAP and transported to 3316.  Pt tolerated well, RT to monitor

## 2013-06-24 NOTE — Procedures (Signed)
I was present at this dialysis session. I have reviewed the session itself and made appropriate changes.   Vinson Moselle  MD Pager [phone]    Cell  [phone] 06/24/2013, 10:10 AM

## 2013-06-25 DIAGNOSIS — J9620 Acute and chronic respiratory failure, unspecified whether with hypoxia or hypercapnia: Secondary | ICD-10-CM

## 2013-06-25 DIAGNOSIS — G4733 Obstructive sleep apnea (adult) (pediatric): Secondary | ICD-10-CM

## 2013-06-25 LAB — CBC
Hemoglobin: 10.1 g/dL — ABNORMAL LOW (ref 12.0–15.0)
MCHC: 28.6 g/dL — ABNORMAL LOW (ref 30.0–36.0)
Platelets: 159 10*3/uL (ref 150–400)
RBC: 3.19 MIL/uL — ABNORMAL LOW (ref 3.87–5.11)
RDW: 15.4 % (ref 11.5–15.5)
WBC: 11.4 10*3/uL — ABNORMAL HIGH (ref 4.0–10.5)

## 2013-06-25 LAB — BLOOD GAS, ARTERIAL
Acid-Base Excess: 3 mmol/L — ABNORMAL HIGH (ref 0.0–2.0)
Drawn by: 27733
Inspiratory PAP: 12
RATE: 10 resp/min
pCO2 arterial: 61.2 mmHg (ref 35.0–45.0)
pO2, Arterial: 109 mmHg — ABNORMAL HIGH (ref 80.0–100.0)

## 2013-06-25 LAB — BASIC METABOLIC PANEL
BUN: 27 mg/dL — ABNORMAL HIGH (ref 6–23)
Creatinine, Ser: 4.77 mg/dL — ABNORMAL HIGH (ref 0.50–1.10)
GFR calc Af Amer: 10 mL/min — ABNORMAL LOW (ref >90)
GFR calc non Af Amer: 8 mL/min — ABNORMAL LOW (ref >90)
Glucose, Bld: 93 mg/dL (ref 70–99)

## 2013-06-25 LAB — GLUCOSE, CAPILLARY
Glucose-Capillary: 102 mg/dL — ABNORMAL HIGH (ref 70–99)
Glucose-Capillary: 103 mg/dL — ABNORMAL HIGH (ref 70–99)

## 2013-06-25 LAB — PROTIME-INR
INR: 1.09 (ref 0.00–1.49)
Prothrombin Time: 13.9 seconds (ref 11.6–15.2)

## 2013-06-25 LAB — HEPARIN LEVEL (UNFRACTIONATED): Heparin Unfractionated: 0.19 IU/mL — ABNORMAL LOW (ref 0.30–0.70)

## 2013-06-25 MED ORDER — NEPRO/CARBSTEADY PO LIQD: 237.0000 mL | ORAL | Status: DC | PRN

## 2013-06-25 MED ORDER — SODIUM CHLORIDE 0.9 % IV SOLN: 100.0000 mL | INTRAVENOUS | Status: DC | PRN

## 2013-06-25 MED ORDER — PENTAFLUOROPROP-TETRAFLUOROETH EX AERO: 1.0000 "application " | INHALATION_SPRAY | CUTANEOUS | Status: DC | PRN

## 2013-06-25 MED ORDER — LIDOCAINE-PRILOCAINE 2.5-2.5 % EX CREA: 1.0000 "application " | TOPICAL_CREAM | CUTANEOUS | Status: DC | PRN

## 2013-06-25 MED ORDER — HEPARIN SODIUM (PORCINE) 1000 UNIT/ML DIALYSIS: 1000.0000 [IU] | INTRAMUSCULAR | Status: DC | PRN

## 2013-06-25 MED ORDER — ALTEPLASE 2 MG IJ SOLR
2.0000 mg | Freq: Once | INTRAMUSCULAR | Status: DC | PRN
  Filled 2013-06-25: qty 2

## 2013-06-25 MED ORDER — WARFARIN SODIUM 5 MG PO TABS
5.0000 mg | ORAL_TABLET | Freq: Once | ORAL | Status: AC
  Administered 2013-06-25: 5 mg via ORAL
  Filled 2013-06-25: qty 1

## 2013-06-25 MED ORDER — LIDOCAINE HCL (PF) 1 % IJ SOLN: 5.0000 mL | INTRAMUSCULAR | Status: DC | PRN

## 2013-06-25 NOTE — Progress Notes (Signed)
PT Cancellation Note  Patient Details Name: [name] MRN: [id number] DOB: 28-May-1941   Cancelled Treatment:    Reason Eval/Treat Not Completed: Medical issues which prohibited therapy  Pt currently on bipap until at least 1100 when labs are rechecked.  HD is planned for the PM today.  PT to follow up tomorrow.   Ilda Foil 06/25/2013, 10:06 AM

## 2013-06-25 NOTE — Progress Notes (Signed)
Discussed code status with patient.  She would want to be intubated for resp failure but does not want CPR, defibrillation/cardioversion or medications administered for any cardiac arrest.  Vinson Moselle  MD Pager [phone]    Cell  [phone] 06/25/2013, 7:34 AM

## 2013-06-25 NOTE — Progress Notes (Signed)
[facility] Progress Note  TEAM 1 - Stepdown ICU Team   LARETHA LUEPKE NFA:[id number] DOB: 09-Apr-1941 DOA: 06/19/2013 PCP: [name], MD  Brief narrative: 72 y.o. female who presented initially to [facility]. She had markedly elevated BP's in the ED there with SBPs of 260, acute respiratory failure on BIPAP, was encephalopathic, and in severe pulmonary edema. She was put on Cardene gtt initially as well as given labetalol and NTG paste. She was transferred to [facility] at [facility]. By the time she arrived to [facility], she was off the BiPAP, off the cardene gtt, was normotensive, and had normal mentation. CCM  asked medicine to admit patient given she no longer required ICU level.  Assessment/Plan:  Acute respiratory failure with hypoxia / Acute pulmonary edema - fluid management via dialysis - extra dialysis today due to pulm edema  Currently with acute hypercarbic resp failure - placed on BiPAP yesterday with out much improvement in ABG seen - will consult Pulm for assistance  Chronic respiratory failure with hypoxia due to:   A) COPD (chronic obstructive pulmonary disease)   B) Pulmonary HTN   C) OSA (obstructive sleep apnea) -chronic O2 at home -non compliant with CPAP  Recurrent AF - hx of Atrial Fib diagnosed April 2013  Pt had developed tachycardia today post HD (rates 140-160) but tolerated well - denied a prior hx of Afib, but records suggested was diagnosed w/ same in [employer] 2013 - must be paroxysmal in occurence as pt has been in NSR majority of hospital stay - full anticoag started 9/24 given CHADVAsc 5 - attempted to control with one time IV cardizem dose followed by oral cardizem but had RVR overnight into 9/25-currently in NSR -TSH 1.629- of note, pt is on coreg only as outpt and since admit discovered "insurance nurse" recommended decreas dose from 12.5 to 6.25 prior to admit -Coumadin started - per cardiology note- low threshold to start Amio for paroxysmal  A-fib but may not be a good idea with COPD  Confusion/mild delirium -new 9/26 after HD- was not like this while in HD - ABG reveals hypercarbic resp failure which is likely the cause  Chest pain -EKG with nonspecific ST changes/similar to July 2014 EKG -enzymes negative x 3 -sx have resolved   CKD (chronic kidney disease) stage V requiring chronic dialysis -per renal  DM2 (diabetes mellitus, type 2) -CBG controlled - no change in tx plan today   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 Vanco and Xifaxan) -has had 3 positive C diff collections: 07/30/12, 10/23/12, and 06/20/13 -ID consulted/ recommend prolonged taper PO Vanco over 6 weeks: 125mg  qid for 1 week  125mg  BID for 1 week  125mg  qday for 1 week  125mg  qod for 1 week  125mg  q3days for 14 days -Rifaxan dc'd 9/23 - insurance WILL NOT PAY at all for PO Vanco- in the least will need a letter of appeal!!- may need to consider SNF for rehab and completes Vanco  Deconditioning -PT/OT eval -expect will need HHRN,PT- face to face completed  HTN (hypertension) -follow w/ increase in coreg and addition of CCB   DVT prophylaxis: full dose lovenox  Code Status: Full Family Communication: no family present at time of exam  Disposition Plan/Expected LOS: TsDU Isolation: Contact isolation for MRSA PCR positive status as well as recent diagnosis C. difficile colitis  Consultants: Nephrology ID  Procedures: None  Antibiotics: Oral vancomycin 9/21 >>>  Xifaxan 9/21 >>>9/23  HPI/Subjective: Pt alert and wanting to eat, however, BiPAP cannot be removed due to persistent resp failure   Objective: Blood pressure 168/42, pulse 72, temperature 97.3 F (36.3 C), temperature source Axillary, resp. rate 20, height 5\' 4"  (1.626 m), weight 81.8 kg (180 lb 5.4 oz), SpO2 94.00%.  Intake/Output Summary (Last 24 hours) at 06/25/13 1343 Last data filed at 06/25/13 0700   Gross per 24 hour  Intake    290 ml  Output      0 ml  Net    290 ml   Exam: General: No acute respiratory distress at rest- Lungs: Clear to auscultation bilaterally without wheezes or crackles-chronic O2 Cardiovascular: tachycardic - irreg irreg - no appreciable gallup/rub Abdomen: nontender, nondistended, soft, bowel sounds positive, no rebound, no ascites, no appreciable mass Musculoskeletal: No significant cyanosis, clubbing of bilateral lower extremities   Scheduled Meds:  Scheduled Meds: . calcium acetate  1,334 mg Oral TID WC  . carvedilol  12.5 mg Oral BID WC  . darbepoetin (ARANESP) injection - DIALYSIS  60 mcg Intravenous Q Fri-HD  . diltiazem  60 mg Oral Q6H  . docusate sodium  100 mg Oral BID  . ezetimibe  10 mg Oral QHS  . ferric gluconate (FERRLECIT/NULECIT) IV  125 mg Intravenous Q Fri-HD  . fluticasone  1 puff Inhalation BID  . insulin aspart  0-9 Units Subcutaneous TID WC  . LORazepam  0.5 mg Intravenous Once  . rOPINIRole  1 mg Oral BID WC  . sodium chloride  3 mL Intravenous Q12H  . vancomycin  125 mg Oral QID  . warfarin  5 mg Oral ONCE-1800  . warfarin  1 each Does not apply Once  . Warfarin - Pharmacist Dosing Inpatient   Does not apply q1800    Data Reviewed: Basic Metabolic Panel:  Recent Labs Lab 06/20/13 0545 06/22/13 0701 06/23/13 0150 06/24/13 0721 06/25/13 1214  NA 138 140 139 134* 136  K 4.7 5.1 4.7 5.3* 4.5  CL 99 101 102 96 98  CO2 24 26 26 27 28   GLUCOSE 86 94 101* 103* 93  BUN 68* 47* 27* 47* 27*  CREATININE 8.12* 6.38* 4.09* 6.38* 4.77*  CALCIUM 8.8 8.5 8.4 8.9 9.0  MG  --   --  2.3  --   --   PHOS  --  6.3*  --  6.4*  --    Liver Function Tests:  Recent Labs Lab 06/19/13 2221 06/22/13 0701 06/24/13 0721  AST 24  --   --   ALT 15  --   --   ALKPHOS 78  --   --   BILITOT 0.4  --   --   PROT 6.7  --   --   ALBUMIN 3.2* 3.0* 2.9*   CBC:  Recent Labs Lab 06/20/13 0545 06/22/13 0709 06/23/13 0150 06/24/13 0721  06/25/13 0435  WBC 14.5* 11.6* 9.8 13.5* 11.4*  HGB 11.1* 10.1* 10.4* 9.8* 10.1*  HCT 37.1 34.9* 36.2 33.7* 35.3*  MCV 106.9* 109.1* 110.0* 109.8* 110.7*  PLT 248 166 175 176 159   Cardiac Enzymes:  Recent Labs Lab 06/20/13 1300 06/20/13 2056 06/21/13 0350  TROPONINI <0.30 <0.30 <0.30   BNP (last 3 results)  Recent Labs  08/19/12 2020 10/19/12 1605 04/13/13 1644  PROBNP 10460.0* 17356.0* 12692.0*   CBG:  Recent Labs Lab 06/23/13 2137 06/24/13 1241 06/24/13 1700 06/24/13 2132 06/25/13 0719  GLUCAP 129* 87 148* 116* 102*    Recent  Results (from the past 240 hour(s))  MRSA PCR SCREENING     Status: None   Collection Time    06/19/13  9:01 PM      Result Value Range Status   MRSA by PCR NEGATIVE  NEGATIVE Final   Comment:            The GeneXpert MRSA Assay (FDA     approved for NASAL specimens     only), is one component of a     comprehensive MRSA colonization     surveillance program. It is not     intended to diagnose MRSA     infection nor to guide or     monitor treatment for     MRSA infections.  CLOSTRIDIUM DIFFICILE BY PCR     Status: Abnormal   Collection Time    06/20/13 11:18 AM      Result Value Range Status   C difficile by pcr POSITIVE (*) NEGATIVE Final   Comment: CRITICAL RESULT CALLED TO, READ BACK BY AND VERIFIED WITH:     Aretha Parrot RN 14:25 06/20/13 ([email or web address])  MRSA PCR SCREENING     Status: None   Collection Time    06/24/13  6:06 PM      Result Value Range Status   MRSA by PCR NEGATIVE  NEGATIVE Final   Comment:            The GeneXpert MRSA Assay (FDA     approved for NASAL specimens     only), is one component of a     comprehensive MRSA colonization     surveillance program. It is not     intended to diagnose MRSA     infection nor to guide or     monitor treatment for     MRSA infections.     Studies:  Recent x-ray studies have been reviewed in detail by the Attending Physician  Calvert Cantor, MD [phone] If  7PM-7AM, please contact night-coverage [email or web address] Password [email or web address] 06/25/2013, 1:43 PM   LOS: 6 days

## 2013-06-25 NOTE — Progress Notes (Signed)
ANTICOAGULATION CONSULT NOTE - Follow Up Consult  Pharmacy Consult for Heparin and Coumadin Indication: atrial fibrillation  Allergies  Allergen Reactions  . Morphine And Related Nausea And Vomiting  . Lipitor [Atorvastatin] Other (See Comments)    Causes drowsiness  . Nsaids     Unknown reported by previous hospital    Patient Measurements: Height: 5\' 4"  (162.6 cm) Weight: 180 lb 5.4 oz (81.8 kg) IBW/kg (Calculated) : 54.7 Heparin Dosing Weight:   Vital Signs: Temp: 97.8 F (36.6 C) (09/27 0722) Temp src: Axillary (09/27 0722) BP: 137/48 mmHg (09/27 0722) Pulse Rate: 65 (09/27 0924)  Labs:  Recent Labs  06/23/13 0150  06/24/13 0035 06/24/13 0721 06/24/13 1800 06/25/13 0435  HGB 10.4*  --   --  9.8*  --  10.1*  HCT 36.2  --   --  33.7*  --  35.3*  PLT 175  --   --  176  --  159  LABPROT  --   --   --   --  12.5 13.9  INR  --   --   --   --  0.95 1.09  HEPARINUNFRC 0.35  < > 0.14* 0.40  --  0.19*  CREATININE 4.09*  --   --  6.38*  --   --   < > = values in this interval not displayed.  Estimated Creatinine Clearance: 8.4 ml/min (by C-G formula based on Cr of 6.38).   Medications:  Scheduled:  . calcium acetate  1,334 mg Oral TID WC  . carvedilol  12.5 mg Oral BID WC  . darbepoetin (ARANESP) injection - DIALYSIS  60 mcg Intravenous Q Fri-HD  . diltiazem  60 mg Oral Q6H  . docusate sodium  100 mg Oral BID  . ezetimibe  10 mg Oral QHS  . ferric gluconate (FERRLECIT/NULECIT) IV  125 mg Intravenous Q Fri-HD  . fluticasone  1 puff Inhalation BID  . insulin aspart  0-9 Units Subcutaneous TID WC  . LORazepam  0.5 mg Intravenous Once  . rOPINIRole  1 mg Oral BID WC  . sodium chloride  3 mL Intravenous Q12H  . vancomycin  125 mg Oral QID  . warfarin  1 each Does not apply Once  . Warfarin - Pharmacist Dosing Inpatient   Does not apply q1800    Assessment: 72yo female with new onset AFib.  Heparin was adjusted early this AM, currently awaiting f/u heparin  level.  INR 1.09.  Hg 10.1 and pltc 159.  No bleeding problems noted.  Goal of Therapy:  Heparin level 0.3-0.7 units/ml, INR 2-3 Monitor platelets by anticoagulation protocol: Yes   Plan:  1.  Repeat Coumadin 5mg  2.  F/U Heparin level at [facility]  Marisue Humble, PharmD Clinical Pharmacist [facility]

## 2013-06-25 NOTE — Progress Notes (Signed)
ANTICOAGULATION CONSULT NOTE - Follow Up Consult  Pharmacy Consult for heparin Indication: atrial fibrillation  Labs:  Recent Labs  06/22/13 0701  06/23/13 0150  06/24/13 0035 06/24/13 0721 06/24/13 1800 06/25/13 0435  HGB  --   < > 10.4*  --   --  9.8*  --  10.1*  HCT  --   < > 36.2  --   --  33.7*  --  35.3*  PLT  --   < > 175  --   --  176  --  159  LABPROT  --   --   --   --   --   --  12.5 13.9  INR  --   --   --   --   --   --  0.95 1.09  HEPARINUNFRC  --   --  0.35  < > 0.14* 0.40  --  0.19*  CREATININE 6.38*  --  4.09*  --   --  6.38*  --   --   < > = values in this interval not displayed.   Assessment: 72yo female now subtherapeutic on heparin after one level at goal.  Goal of Therapy:  Heparin level 0.3-0.7 units/ml   Plan:  Will increase heparin gtt by 2-3 units/kg/hr to 1600 units/hr and check level in 8hr.  Vernard Gambles, PharmD, BCPS  06/25/2013,6:07 AM

## 2013-06-25 NOTE — Procedures (Signed)
I was present at this dialysis session. I have reviewed the session itself and made appropriate changes.   Vinson Moselle  MD Pager [phone]    Cell  [phone] 06/25/2013, 1:27 PM

## 2013-06-25 NOTE — Progress Notes (Signed)
Subjective:  Currently on Bipap.  Not uncomfortable.  Not severely SOB.  No chest pain  Objective:  Vital Signs in the last 24 hours: BP 137/48  Pulse 65  Temp(Src) 97.8 F (36.6 C) (Axillary)  Resp 24  Ht 5\' 4"  (1.626 m)  Wt 81.8 kg (180 lb 5.4 oz)  BMI 30.94 kg/m2  SpO2 89%  Physical Exam: Obese WF in NAD on Bipap Lungs:  Reduce breath sounds Cardiac:  Regular rhythm, normal S1 and S2, no S3, 1/6 murmur Abdomen:  Soft, nontender, no masses Extremities:  No edema present, fistula in right arm  Intake/Output from previous day: 09/26 0701 - 09/27 0700 In: 770 [P.O.:480; I.V.:290] Out: 2800  Weight Filed Weights   06/24/13 1140 06/24/13 1800 06/25/13 0340  Weight: 81 kg (178 lb 9.2 oz) 82.8 kg (182 lb 8.7 oz) 81.8 kg (180 lb 5.4 oz)    Lab Results: Basic Metabolic Panel:  Recent Labs  40/98/11 0150 06/24/13 0721  NA 139 134*  K 4.7 5.3*  CL 102 96  CO2 26 27  GLUCOSE 101* 103*  BUN 27* 47*  CREATININE 4.09* 6.38*    CBC:  Recent Labs  06/24/13 0721 06/25/13 0435  WBC 13.5* 11.4*  HGB 9.8* 10.1*  HCT 33.7* 35.3*  MCV 109.8* 110.7*  PLT 176 159   BNP    Component Value Date/Time   PROBNP 12692.0* 04/13/2013 1644   Telemetry: Sinus rhythm.  One 4 beat run of VT overmight  Assessment/Plan:  1. PAF currently in normal sinus rhythm.  Being anticoagulated 2. Acute respiratory failure 3. 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  I would have a low threshold for amiodarone.     Darden Palmer  MD [facility]  06/25/2013, 9:45 AM

## 2013-06-25 NOTE — Progress Notes (Signed)
Renal Service Daily Progress Note [facility]  Subjective: Had SOB last night with hypercarbia on ABG. CXR showed improved edema but still with IS edema pattern. On bipap now. HD yest 2800 off with mostly stable BP  Physical Exam:  Blood pressure 137/48, pulse 71, temperature 97.8 F (36.6 C), temperature source Axillary, resp. rate 23, height 5\' 4"  (1.626 m), weight 81.8 kg (180 lb 5.4 oz), SpO2 99.00%. Gen: elderly, chronically ill-appearing, NAD, more drowsy today than yesterday Heart: RRR no rub or gallop Lungs: occ ant wheezes, post lung fields clear Abdomen: soft, NT, obese, + BS Extremities: No LE edema Access: R AVF + bruit  Outpatient HD: (MWF [facility])  4hr F160 400/1.5 81kg 2K/2.5Ca RUA AVF No heparin  Hectorol none EPO 7400 Venofer 50mg /wk  Assessment/Plan: 1. A/C resp failure- on bipap, cxr improved but still wet and SOB, plan extra HD today for volume 2. Paroxsymal afib- low dose metoprolol for coreg, po dilt started, back in NSR, IV heparin and warfarin started per cardiology. BP may be an issue with added dilt and vol issues 3. ESRD, usual HD MWF 4. Anemia Hb 9.8 < 11.6, on darbe 60ug/fri for now 5. Metabolic bone disease - Ca wnl, cont binders, no vit D 6. HTN/volume- push volume down w HD today, off coreg > on metoprolol and dilt added for afib 7. Hx recurrent C. diff - on PO vanc per pharmacy, taper over 6 wks per ID 8. Hx COPD on home O2 9. DM 2- glipizide  Vinson Moselle  MD Pager [phone]    Cell  [phone] 06/25/2013, 8:25 AM    Recent Labs Lab 06/22/13 0701 06/23/13 0150 06/24/13 0721  NA 140 139 134*  K 5.1 4.7 5.3*  CL 101 102 96  CO2 26 26 27   GLUCOSE 94 101* 103*  BUN 47* 27* 47*  CREATININE 6.38* 4.09* 6.38*  CALCIUM 8.5 8.4 8.9  PHOS 6.3*  --  6.4*    Recent Labs Lab 06/19/13 2221 06/22/13 0701 06/24/13 0721  AST 24  --   --   ALT 15  --   --   ALKPHOS 78  --   --   BILITOT 0.4  --   --   PROT 6.7  --   --    ALBUMIN 3.2* 3.0* 2.9*    Recent Labs Lab 06/23/13 0150 06/24/13 0721 06/25/13 0435  WBC 9.8 13.5* 11.4*  HGB 10.4* 9.8* 10.1*  HCT 36.2 33.7* 35.3*  MCV 110.0* 109.8* 110.7*  PLT 175 176 159   . calcium acetate  1,334 mg Oral TID WC  . carvedilol  12.5 mg Oral BID WC  . darbepoetin (ARANESP) injection - DIALYSIS  60 mcg Intravenous Q Fri-HD  . diltiazem  60 mg Oral Q6H  . docusate sodium  100 mg Oral BID  . ezetimibe  10 mg Oral QHS  . ferric gluconate (FERRLECIT/NULECIT) IV  125 mg Intravenous Q Fri-HD  . fluticasone  1 puff Inhalation BID  . insulin aspart  0-9 Units Subcutaneous TID WC  . LORazepam  0.5 mg Intravenous Once  . rOPINIRole  1 mg Oral BID WC  . sodium chloride  3 mL Intravenous Q12H  . vancomycin  125 mg Oral QID  . warfarin  1 each Does not apply Once  . Warfarin - Pharmacist Dosing Inpatient   Does not apply q1800   . heparin 1,600 Units/hr (06/25/13 0700)   sodium chloride, sodium chloride, acetaminophen, acetaminophen, calcium carbonate, feeding  supplement (NEPRO CARB STEADY), heparin, levalbuterol, lidocaine (PF), lidocaine-prilocaine, ondansetron (ZOFRAN) IV, ondansetron, pentafluoroprop-tetrafluoroeth

## 2013-06-25 NOTE — Consult Note (Addendum)
PULMONARY  / CRITICAL CARE MEDICINE  Name: [name] MRN: [id number] DOB: 1940-11-18    ADMISSION DATE:  06/19/2013 CONSULTATION DATE:  06/25/2013  REFERRING MD :  Dr. Butler Denmark. PRIMARY SERVICE:  TRH.  CHIEF COMPLAINT:  SOB  BRIEF PATIENT DESCRIPTION: 72 year old female with extensive PMH including COPD and pulmonary HTN and ESRD who presented to the hospital with a hypertensive emergency.  Patient was admitted to the SDU and BP stabilized.  On 9/27 an ABG was done that showed respiratory acidosis with pH of 7.295 and CO2 of 60.  Patient was started on BiPAP and PCCM was called on consultation.  SIGNIFICANT EVENTS / STUDIES:  9/26 respiratory failure on BiPAP.  LINES / TUBES: PIV  CULTURES: 9/22 C. Diff positive 9/26 MRSA negative  ANTIBIOTICS: 9/22 PO Vanc>>>  PAST MEDICAL HISTORY :  Past Medical History  Diagnosis Date  . Carotid artery occlusion     bilat CEA in 2012?  . C. 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 Hypertension   . Atrial fibrillation April  2013  . Pulmonary hypertension   . Secondary hyperparathyroidism, renal   . COPD (chronic obstructive pulmonary disease)   . Hypercholesterolemia   . History of MI (myocardial infarction)     "they say I had 2 years ago" (08/20/2012)  . GERD (gastroesophageal reflux disease)   . History of pneumonia     "have it alot" (08/20/2012)  . Sleep apnea     "don't wear mask anymore"  . Type II diabetes mellitus   . History of blood transfusion 2013    "3 times so far in 2013:  4 pints one time; 2 pints another; ?# last time" (08/20/2012)  . Iron deficiency anemia   . Seizures 2012    after carotid surgery  . ESRD on dialysis     "[place]; Tues, Thurs; Sat"  . Renal cell carcinoma 2005?  Marland Kitchen On home oxygen therapy     "24/7"   . Acute diverticulitis     Oct 2013, treated medically [facility], sigmoid diverticulitits  . CHF (congestive heart failure)    Past Surgical History  Procedure  Laterality Date  . Nephrectomy  2005    Right, for Renal cell carcinoma  . Carotid endarterectomy  2012    bilaterally  . Av fistula placement  Jan. 7, 2011    Right  upper arm by Dr. Hart [place]  . Hd catheter  Aug. 22, 2013    [facility]  . Appendectomy      childhood  . Abdominal hysterectomy  1971?  Marland Kitchen Coronary angioplasty with stent placement  1990's    "1 total"  . Coronary angioplasty  1990's  . Av fistula repair  2013    right upper arm  . Esophagogastroduodenoscopy N/A 12/29/2012    Procedure: ESOPHAGOGASTRODUODENOSCOPY (EGD);  Surgeon: Petra Kuba, MD;  Location: [facility] ENDOSCOPY;  Service: Endoscopy;  Laterality: N/A;  . Colonoscopy with propofol N/A 12/31/2012    Procedure: COLONOSCOPY WITH PROPOFOL;  Surgeon: Petra Kuba, MD;  Location: [facility] ENDOSCOPY;  Service: Endoscopy;  Laterality: N/A;  currently IP at [facility] [id number]   Prior to Admission medications   Medication Sig Start Date End Date Taking? Authorizing Provider  albuterol (PROVENTIL) (2.5 MG/3ML) 0.083% nebulizer solution Take 2.5 mg by nebulization 2 (two) times daily.   Yes Historical Provider, MD  calcium acetate (PHOSLO) 667 MG capsule Take 1,334 mg by mouth 3 (three) times  daily with meals.   Yes Historical Provider, MD  ezetimibe (ZETIA) 10 MG tablet Take 10 mg by mouth at bedtime.    Yes Historical Provider, MD  fluticasone (FLOVENT HFA) 110 MCG/ACT inhaler Inhale 2 puffs into the lungs 3 (three) times daily as needed (for wheezing and shortness of breath).   Yes Historical Provider, MD  glipiZIDE (GLUCOTROL) 5 MG tablet Take 5 mg by mouth daily as needed (if CBG is >150).   Yes Historical Provider, MD  ipratropium (ATROVENT) 0.02 % nebulizer solution Take 500 mcg by nebulization every 6 (six) hours as needed for wheezing.   Yes Historical Provider, MD  rOPINIRole (REQUIP) 1 MG tablet Take 1 mg by mouth 2 (two) times daily.   Yes Historical Provider, MD  calcium carbonate (TUMS - DOSED IN MG ELEMENTAL CALCIUM) 500 MG  chewable tablet Chew 2 tablets (400 mg of elemental calcium total) by mouth 3 (three) times daily as needed for heartburn. 06/24/13   Russella Dar, NP  carvedilol (COREG) 12.5 MG tablet Take 1 tablet (12.5 mg total) by mouth 2 (two) times daily with a meal. Hold this medication on the morning of dialysis but once dialysis treatment complete he may take later in the afternoon after dialysis treatment complete 06/24/13   Russella Dar, NP  diltiazem (CARTIA XT) 240 MG 24 hr capsule Take 1 capsule (240 mg total) by mouth daily. Please hold this medication on the morning of dialysis. He may take this medication watch her dialysis session is complete. 06/24/13   Russella Dar, NP  vancomycin (VANCOCIN) 50 mg/mL oral solution 125mg  4 times a day for 1 week (until 9/27) 125mg  twice daily for 1 week  (9/28 - 10/4) 125mg  once daily for 1 week  (10/5 - 10/12) 125mg  every other day for 1 week  (10/13 - 1-/20)  125mg  every 3 days for 14 days (10/21 - 11/5) 06/24/13   Russella Dar, NP  warfarin (COUMADIN) 5 MG tablet Take one tab daily at supper or as adjusted by [facility] 06/24/13   Russella Dar, NP   Allergies  Allergen Reactions  . Morphine And Related Nausea And Vomiting  . Lipitor [Atorvastatin] Other (See Comments)    Causes drowsiness  . Nsaids     Unknown reported by previous hospital    FAMILY HISTORY:  Family History  Problem Relation Age of Onset  . Cancer Mother     pancreatic  . Stroke Father   . Coronary artery disease Father   . Cancer Brother     Brain  . Kidney disease Brother     Kidney stones   SOCIAL HISTORY:  reports that she quit smoking about 9 years ago. Her smoking use included Cigarettes. She has a 67.5 pack-year smoking history. She has never used smokeless tobacco. She reports that she does not drink alcohol or use illicit drugs.  REVIEW OF SYSTEMS:   Constitutional: Negative for fever, chills, weight loss, malaise/fatigue and diaphoresis.  HENT:  Negative for hearing loss, ear pain, nosebleeds, congestion, sore throat, neck pain, tinnitus and ear discharge.   Eyes: Negative for blurred vision, double vision, photophobia, pain, discharge and redness.  Respiratory: Negative for cough, hemoptysis, sputum production, shortness of breath, wheezing and stridor.   Cardiovascular: Negative for chest pain, palpitations, orthopnea, claudication, leg swelling and PND.  Gastrointestinal: Negative for heartburn, nausea, vomiting, abdominal pain, diarrhea, constipation, blood in stool and melena.  Genitourinary: Negative for dysuria, urgency, frequency, hematuria and flank  pain.  Musculoskeletal: Negative for myalgias, back pain, joint pain and falls.  Skin: Negative for itching and rash.  Neurological: Negative for dizziness, tingling, tremors, sensory change, speech change, focal weakness, seizures, loss of consciousness, weakness and headaches.  Endo/Heme/Allergies: Negative for environmental allergies and polydipsia. Does not bruise/bleed easily.  SUBJECTIVE:   VITAL SIGNS: Temp:  [97.3 F (36.3 C)-100.4 F (38 C)] 97.7 F (36.5 C) (09/27 1522) Pulse Rate:  [65-89] 78 (09/27 1522) Resp:  [16-25] 21 (09/27 1522) BP: (100-176)/(38-85) 123/54 mmHg (09/27 1522) SpO2:  [89 %-100 %] 93 % (09/27 1522) FiO2 (%):  [28 %-80 %] 28 % (09/27 1121) Weight:  [78.3 kg (172 lb 9.9 oz)-82.8 kg (182 lb 8.7 oz)] 78.3 kg (172 lb 9.9 oz) (09/27 1522)  PHYSICAL EXAMINATION: General:  Chronically ill appearing obese female, in no acute distress on [place].  Mentating well and conversing. Neuro:  Alert and interactive, moving all ext to command. HEENT:  [place]/AT, PERRL, EOM-I and MMM. Neck:  Supple, -LAN and -thyromegally. Cardiovascular:  RRR, Nl S1/S2, -M/R/G. Lungs:  Decrease BS at the bases, distant BS overall. Abdomen:  Soft, obese, NT, ND and +BS. Musculoskeletal:  1+ edema bilaterally and -tenderness. Skin:  Multiple bruises, intact otherwise.   Recent  Labs Lab 06/23/13 0150 06/24/13 0721 06/25/13 1214  NA 139 134* 136  K 4.7 5.3* 4.5  CL 102 96 98  CO2 26 27 28   BUN 27* 47* 27*  CREATININE 4.09* 6.38* 4.77*  GLUCOSE 101* 103* 93    Recent Labs Lab 06/23/13 0150 06/24/13 0721 06/25/13 0435  HGB 10.4* 9.8* 10.1*  HCT 36.2 33.7* 35.3*  WBC 9.8 13.5* 11.4*  PLT 175 176 159   Dg Chest Port 1 View  06/24/2013   CLINICAL DATA:  Severe shortness of breath. Oxygen dependent. Former smoker. Prior history of coronary artery stenting. Current history of hypertension, diabetes, and end-stage renal disease on hemodialysis.  EXAM: PORTABLE CHEST - 1 VIEW  COMPARISON:  06/21/2013, 06/19/2013, 12/28/2012, 01/10/2012 ([place] [facility]). Two-view chest x-ray 04/13/2013 [facility] toggle case.  FINDINGS: Cardiac silhouette moderately enlarged. Mild diffuse interstitial pulmonary edema. No confluent airspace consolidation. No visible pleural effusions.  IMPRESSION: Mild CHF and/or fluid overload, with stable moderate cardiomegaly and mild diffuse interstitial pulmonary edema.   Electronically Signed   By: Hulan Saas   On: 06/24/2013 17:12    ASSESSMENT / PLAN:  72 year old female with some acute on chronic respiratory failure.  COPD, obesity hypoventilation, OSA not compliant with CPAP and pulmonary HTN.  Chronically on 3L [place].  At this point, I see no indication for intubation or ICU transfer.  I hear no wheezing to suggest a COPD exacerbation per se.  CXR is also not consistent with PNA.  Pulmonary edema is more evident on CXR from 9/26.  Recommendations: - PRN albuterol as needed. - Keep on the dry side as possible. - No need for steroids or abx at this time. - Recommend CPAP when asleep as I suspect patient is starting to accumulate more CO2 while asleep resulting in hypercarbia while awake. - Strongly recommend CPAP use at home and that was relayed to the patient. - IS per RT protocol. - Titrate of O2 for sat of 88-92% (she  is 97 on 3L which is her home O2). - Mobilization and OOB to chair as tolerated. - No need for further ABGs. - Watch for ARDS in a patient with C. Diff colitis.  PCCM will follow with  you.  Alyson Reedy, M.D. Pulmonary and Critical Care Medicine [facility] Pager: [phone]  06/25/2013, 4:20 PM

## 2013-06-26 DIAGNOSIS — R0902 Hypoxemia: Secondary | ICD-10-CM

## 2013-06-26 DIAGNOSIS — J9610 Chronic respiratory failure, unspecified whether with hypoxia or hypercapnia: Secondary | ICD-10-CM

## 2013-06-26 LAB — CBC
HCT: 40.4 % (ref 36.0–46.0)
MCH: 31.8 pg (ref 26.0–34.0)
MCHC: 29.2 g/dL — ABNORMAL LOW (ref 30.0–36.0)
MCV: 108.9 fL — ABNORMAL HIGH (ref 78.0–100.0)
Platelets: 194 10*3/uL (ref 150–400)
RBC: 3.71 MIL/uL — ABNORMAL LOW (ref 3.87–5.11)
RDW: 15.5 % (ref 11.5–15.5)

## 2013-06-26 LAB — GLUCOSE, CAPILLARY
Glucose-Capillary: 156 mg/dL — ABNORMAL HIGH (ref 70–99)
Glucose-Capillary: 137 mg/dL — ABNORMAL HIGH (ref 70–99)
Glucose-Capillary: 86 mg/dL (ref 70–99)

## 2013-06-26 MED ORDER — WARFARIN SODIUM 7.5 MG PO TABS
7.5000 mg | ORAL_TABLET | Freq: Once | ORAL | Status: AC
  Administered 2013-06-26: 7.5 mg via ORAL
  Filled 2013-06-26: qty 1

## 2013-06-26 MED ORDER — ROPINIROLE HCL 1 MG PO TABS
1.0000 mg | ORAL_TABLET | Freq: Once | ORAL | Status: AC
  Administered 2013-06-26: 1 mg via ORAL

## 2013-06-26 MED ORDER — DILTIAZEM HCL ER COATED BEADS 180 MG PO CP24
180.0000 mg | ORAL_CAPSULE | Freq: Every day | ORAL | Status: DC
  Administered 2013-06-26 – 2013-06-27 (×2): 180 mg via ORAL
  Filled 2013-06-26 (×2): qty 1

## 2013-06-26 NOTE — Progress Notes (Addendum)
[facility] Progress Note [place] TEAM 1 - Stepdown ICU Team   [name] ZOX:[id number] DOB: 1941-09-02 DOA: 06/19/2013 PCP: [name], MD  Brief narrative: 72 y.o. female who presented initially to [facility]. She had markedly elevated BP's in the ED there with SBPs of 260, acute respiratory failure on BIPAP, was encephalopathic, and in severe pulmonary edema. She was put on Cardene gtt initially as well as given labetalol and NTG paste. She was transferred to [facility]. By the time she arrived to [facility], she was off the BiPAP, off the cardene gtt, was normotensive, and had normal mentation. CCM  asked medicine to admit patient given she no longer required ICU level.  Assessment/Plan:  Acute respiratory failure with hypoxia / Acute pulmonary edema - fluid management via dialysis - extra dialysis yesterday due to pulm edema  Currently with acute hypercarbic resp failure - placed on BiPAP - asked pulm to give input on how to correct hypercarbia and acidosis as >12 hrs on BiPAP did not improve it - recommended to use CPAP every night- have ordered it today- pt agrees to it  Chronic respiratory failure with hypoxia due to:   A) COPD (chronic obstructive pulmonary disease)   B) Pulmonary HTN   C) OSA (obstructive sleep apnea) -chronic O2 at home- will need to keep < 92% to correct CO2 retention-  -non compliant with CPAP- agrees to it now  Recurrent AF - hx of Atrial Fib diagnosed April 2013  - denied a prior hx of Afib, but records suggested was diagnosed w/ same in [employer] 2013   - full anticoag given CHADVAsc 5 - -Coumadin started  -TSH 1.629 - of note, pt is on coreg only as outpt and since admit discovered "insurance nurse" recommended decreas dose from 12.5 to 6.25 prior to admit- we have increased it back to 12.5  - have changed Cardizem to long acting today - per cardiology note- low threshold to start Amio for paroxysmal A-fib but may not be a good idea with  COPD  Confusion/mild delirium -new 9/26 after HD- was not like this while in HD - ABG reveals hypercarbic resp failure  - pt much better now  Chest pain -EKG with nonspecific ST changes/similar to July 2014 EKG -enzymes negative x 3 -sx have resolved   CKD (chronic kidney disease) stage V requiring chronic dialysis -per renal  DM2 (diabetes mellitus, type 2) -CBG controlled - no change in tx plan today   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 -endorsed had been on oral meds for about 1 week - Flagyl -has had 3 positive C diff collections: 07/30/12, 10/23/12, and 06/20/13 -ID consulted/ recommend prolonged taper PO Vanco over 6 weeks: 125mg  qid for 1 week  125mg  BID for 1 week  125mg  qday for 1 week  125mg  qod for 1 week  125mg  q3days for 14 days -Rifaxan dc'd 9/23 - insurance WILL NOT PAY at all for PO Vanco- in the least will need a letter of appeal - have spoken with case management again today- over the weekend, nothing can be done- she will leave a message for the case manager for tomorrow  Deconditioning -PT/OT eval still pending  HTN (hypertension) -follow w/ increase in coreg and addition of CCB   DVT prophylaxis:SCDs Code Status: partial Family Communication: no family present at time of exam  Disposition Plan/Expected LOS: ts to tele- keep on team 1 for now Isolation: Contact isolation for MRSA PCR positive status as  well as recent diagnosis C. difficile colitis  Consultants: Nephrology ID  Procedures: None  Antibiotics: Oral vancomycin 9/21 >>> Xifaxan 9/21 >>>9/23  HPI/Subjective: Pt alert - has no complaints- discussed the need for CPAP while sleeping and she now agrees   Objective: Blood pressure 135/47, pulse 79, temperature 98.5 F (36.9 C), temperature source Oral, resp. rate 20, height 5\' 4"  (1.626 m), weight 79.2 kg (174 lb 9.7 oz), SpO2 96.00%.  Intake/Output Summary (Last 24 hours) at 06/26/13 0847 Last data filed at 06/26/13  0600  Gross per 24 hour  Intake    562 ml  Output   3050 ml  Net  -2488 ml   Exam: General: No acute respiratory distress at rest- Lungs: Clear to auscultation bilaterally without wheezes or crackles-chronic O2 Cardiovascular: RRR - no appreciable gallup/rub Abdomen: nontender, nondistended, soft, bowel sounds positive, no rebound, no ascites, no appreciable mass Musculoskeletal: No significant cyanosis, clubbing of bilateral lower extremities   Scheduled Meds:  Scheduled Meds: . calcium acetate  1,334 mg Oral TID WC  . carvedilol  12.5 mg Oral BID WC  . darbepoetin (ARANESP) injection - DIALYSIS  60 mcg Intravenous Q Fri-HD  . diltiazem  180 mg Oral Daily  . docusate sodium  100 mg Oral BID  . ezetimibe  10 mg Oral QHS  . ferric gluconate (FERRLECIT/NULECIT) IV  125 mg Intravenous Q Fri-HD  . fluticasone  1 puff Inhalation BID  . insulin aspart  0-9 Units Subcutaneous TID WC  . LORazepam  0.5 mg Intravenous Once  . rOPINIRole  1 mg Oral BID WC  . sodium chloride  3 mL Intravenous Q12H  . vancomycin  125 mg Oral QID  . Warfarin - Pharmacist Dosing Inpatient   Does not apply q1800    Data Reviewed: Basic Metabolic Panel:  Recent Labs Lab 06/20/13 0545 06/22/13 0701 06/23/13 0150 06/24/13 0721 06/25/13 1214  NA 138 140 139 134* 136  K 4.7 5.1 4.7 5.3* 4.5  CL 99 101 102 96 98  CO2 24 26 26 27 28   GLUCOSE 86 94 101* 103* 93  BUN 68* 47* 27* 47* 27*  CREATININE 8.12* 6.38* 4.09* 6.38* 4.77*  CALCIUM 8.8 8.5 8.4 8.9 9.0  MG  --   --  2.3  --   --   PHOS  --  6.3*  --  6.4*  --    Liver Function Tests:  Recent Labs Lab 06/19/13 2221 06/22/13 0701 06/24/13 0721  AST 24  --   --   ALT 15  --   --   ALKPHOS 78  --   --   BILITOT 0.4  --   --   PROT 6.7  --   --   ALBUMIN 3.2* 3.0* 2.9*   CBC:  Recent Labs Lab 06/22/13 0709 06/23/13 0150 06/24/13 0721 06/25/13 0435 06/26/13 0600  WBC 11.6* 9.8 13.5* 11.4* 12.5*  HGB 10.1* 10.4* 9.8* 10.1* 11.8*  HCT  34.9* 36.2 33.7* 35.3* 40.4  MCV 109.1* 110.0* 109.8* 110.7* 108.9*  PLT 166 175 176 159 194   Cardiac Enzymes:  Recent Labs Lab 06/20/13 1300 06/20/13 2056 06/21/13 0350  TROPONINI <0.30 <0.30 <0.30   BNP (last 3 results)  Recent Labs  08/19/12 2020 10/19/12 1605 04/13/13 1644  PROBNP 10460.0* 17356.0* 12692.0*   CBG:  Recent Labs Lab 06/24/13 2132 06/25/13 0719 06/25/13 1700 06/25/13 2140 06/26/13 0727  GLUCAP 116* 102* 103* 157* 85    Recent Results (from the past 240  hour(s))  MRSA PCR SCREENING     Status: None   Collection Time    06/19/13  9:01 PM      Result Value Range Status   MRSA by PCR NEGATIVE  NEGATIVE Final   Comment:            The GeneXpert MRSA Assay (FDA     approved for NASAL specimens     only), is one component of a     comprehensive MRSA colonization     surveillance program. It is not     intended to diagnose MRSA     infection nor to guide or     monitor treatment for     MRSA infections.  CLOSTRIDIUM DIFFICILE BY PCR     Status: Abnormal   Collection Time    06/20/13 11:18 AM      Result Value Range Status   C difficile by pcr POSITIVE (*) NEGATIVE Final   Comment: CRITICAL RESULT CALLED TO, READ BACK BY AND VERIFIED WITH:     Aretha Parrot RN 14:25 06/20/13 ([email or web address])  MRSA PCR SCREENING     Status: None   Collection Time    06/24/13  6:06 PM      Result Value Range Status   MRSA by PCR NEGATIVE  NEGATIVE Final   Comment:            The GeneXpert MRSA Assay (FDA     approved for NASAL specimens     only), is one component of a     comprehensive MRSA colonization     surveillance program. It is not     intended to diagnose MRSA     infection nor to guide or     monitor treatment for     MRSA infections.     Studies:  Recent x-ray studies have been reviewed in detail by the Attending Physician  Calvert Cantor, MD [phone] If 7PM-7AM, please contact night-coverage [email or web address] Password [facility] 06/26/2013, 8:47 AM    LOS: 7 days

## 2013-06-26 NOTE — Progress Notes (Signed)
Placed pt. On CPAP auto titrate (min: 5, max: 15) via nasal mask with 2L O2 bled in. Pt. Is tolerating CPAP well at this time without any complications.

## 2013-06-26 NOTE — Progress Notes (Signed)
PULMONARY  / CRITICAL CARE MEDICINE  Name: [name] MRN: [id number] DOB: [birth year]    ADMISSION DATE:  06/19/2013 CONSULTATION DATE:  06/25/2013  REFERRING MD :  Dr. Butler Denmark. PRIMARY SERVICE:  TRH.  CHIEF COMPLAINT:  SOB  BRIEF PATIENT DESCRIPTION: 72 year old female with extensive PMH including COPD and pulmonary HTN and ESRD who presented to the hospital with a hypertensive emergency.  Patient was admitted to the SDU and BP stabilized.  On 9/27 an ABG was done that showed respiratory acidosis with pH of 7.295 and CO2 of 60.  Patient was started on BiPAP and PCCM was called on consultation.  SIGNIFICANT EVENTS / STUDIES:  9/26 respiratory failure on BiPAP.  LINES / TUBES: PIV  CULTURES: 9/22 C. Diff positive 9/26 MRSA negative  ANTIBIOTICS: 9/22 PO Vanc>>>   SUBJECTIVE: Still a little dyspnea, much improved. She has an old CPAP/ [employer], nasal mask, unknown pressure, prescribed years ago by Dr Blenda Nicely in [place] after sleep study there. She has not followed with him in a long time. She uses it on those nights when she feels short of breath.  VITAL SIGNS: Temp:  [97.3 F (36.3 C)-98.8 F (37.1 C)] 98.5 F (36.9 C) (09/28 0759) Pulse Rate:  [68-89] 74 (09/28 0759) Resp:  [16-26] 21 (09/28 0759) BP: (107-176)/(42-85) 134/55 mmHg (09/28 0759) SpO2:  [89 %-96 %] 93 % (09/28 0759) FiO2 (%):  [28 %] 28 % (09/27 1121) Weight:  [78.3 kg (172 lb 9.9 oz)-79.2 kg (174 lb 9.7 oz)] 79.2 kg (174 lb 9.7 oz) (09/28 0300)  PHYSICAL EXAMINATION: General:  Chronically ill appearing obese female, in no acute distress on [place].  Mentating well and conversing.Up in chair Neuro:  Alert and interactive, moving all ext to command. HEENT:  [place]/AT, PERRL, EOM-I and MMM. Neck:  Supple, -LAN and -thyromegally. Cardiovascular:  RRR, Nl S1/S2, -M/R/G. Lungs:  Decrease BS at the bases, distant BS overall. Abdomen:  Soft, obese, NT, ND and +BS. Musculoskeletal:  1+ edema bilaterally and  -tenderness. Skin:  Multiple bruises, intact otherwise.   Recent Labs Lab 06/23/13 0150 06/24/13 0721 06/25/13 1214  NA 139 134* 136  K 4.7 5.3* 4.5  CL 102 96 98  CO2 26 27 28   BUN 27* 47* 27*  CREATININE 4.09* 6.38* 4.77*  GLUCOSE 101* 103* 93    Recent Labs Lab 06/24/13 0721 06/25/13 0435 06/26/13 0600  HGB 9.8* 10.1* 11.8*  HCT 33.7* 35.3* 40.4  WBC 13.5* 11.4* 12.5*  PLT 176 159 194   Dg Chest Port 1 View  06/24/2013   CLINICAL DATA:  Severe shortness of breath. Oxygen dependent. Former smoker. Prior history of coronary artery stenting. Current history of hypertension, diabetes, and end-stage renal disease on hemodialysis.  EXAM: PORTABLE CHEST - 1 VIEW  COMPARISON:  06/21/2013, 06/19/2013, 12/28/2012, 01/10/2012 ([place] [facility]). Two-view chest x-ray 04/13/2013 [facility] toggle case.  FINDINGS: Cardiac silhouette moderately enlarged. Mild diffuse interstitial pulmonary edema. No confluent airspace consolidation. No visible pleural effusions.  IMPRESSION: Mild CHF and/or fluid overload, with stable moderate cardiomegaly and mild diffuse interstitial pulmonary edema.   Electronically Signed   By: Hulan Saas   On: 06/24/2013 17:12    ASSESSMENT / PLAN:  72 year old female with some acute on chronic respiratory failure.  COPD, obesity hypoventilation, OSA not compliant with CPAP and pulmonary HTN.  Chronically on 3L [place].  Recommendations: - PRN albuterol as needed. - Keep on the dry side as possible. - No need  for steroids or abx at this time. - Recommend CPAP when asleep as I suspect patient is starting to accumulate more CO2 while asleep resulting in hypercarbia while awake. - Strongly recommend CPAP use at home and that was relayed to the patient.  - Recommend she establish with [facility] for COPD and sleep medicine/ CPAP management after discharge. - IS per RT protocol. - Titrate of O2 for sat of 88-92% (she is 97 on 3L which is her home  O2). - Mobilization and OOB to chair as tolerated. - No need for further ABGs. - Watch for ARDS in a patient with C. Diff colitis.  PCCM will follow with you.  CD Maple Hudson,  M.D. Pulmonary and Critical Care Medicine [facility] [phone]    p- [phone]  After hours Pager: [phone]  06/26/2013, 10:49 AM

## 2013-06-26 NOTE — Progress Notes (Signed)
ANTICOAGULATION CONSULT NOTE - Follow Up Consult  Pharmacy Consult for Coumadin Indication: atrial fibrillation  Allergies  Allergen Reactions  . Morphine And Related Nausea And Vomiting  . Lipitor [Atorvastatin] Other (See Comments)    Causes drowsiness  . Nsaids     Unknown reported by previous hospital    Patient Measurements: Height: 5\' 4"  (162.6 cm) Weight: 174 lb 9.7 oz (79.2 kg) IBW/kg (Calculated) : 54.7 Heparin Dosing Weight:   Vital Signs: Temp: 98.5 F (36.9 C) (09/28 0759) Temp src: Oral (09/28 0759) BP: 134/55 mmHg (09/28 0759) Pulse Rate: 74 (09/28 0759)  Labs:  Recent Labs  06/24/13 0721 06/24/13 1800 06/25/13 0435 06/25/13 1214 06/26/13 0600  HGB 9.8*  --  10.1*  --  11.8*  HCT 33.7*  --  35.3*  --  40.4  PLT 176  --  159  --  194  LABPROT  --  12.5 13.9  --  13.3  INR  --  0.95 1.09  --  1.03  HEPARINUNFRC 0.40  --  0.19*  --  <0.10*  CREATININE 6.38*  --   --  4.77*  --     Estimated Creatinine Clearance: 11 ml/min (by C-G formula based on Cr of 4.77).   Medications:  Scheduled:  . calcium acetate  1,334 mg Oral TID WC  . carvedilol  12.5 mg Oral BID WC  . darbepoetin (ARANESP) injection - DIALYSIS  60 mcg Intravenous Q Fri-HD  . diltiazem  180 mg Oral Daily  . docusate sodium  100 mg Oral BID  . ezetimibe  10 mg Oral QHS  . ferric gluconate (FERRLECIT/NULECIT) IV  125 mg Intravenous Q Fri-HD  . fluticasone  1 puff Inhalation BID  . insulin aspart  0-9 Units Subcutaneous TID WC  . LORazepam  0.5 mg Intravenous Once  . rOPINIRole  1 mg Oral BID WC  . sodium chloride  3 mL Intravenous Q12H  . vancomycin  125 mg Oral QID  . Warfarin - Pharmacist Dosing Inpatient   Does not apply q1800    Assessment: 72yo female with new AFib.  INR 1.03- moving slowly.  Heparin d/c'd.  Hg 11.8 and pltc wnl.  No bleeding problems noted.  Goal of Therapy:  INR 2-3 Monitor platelets by anticoagulation protocol: Yes   Plan:  1.  Coumadin 7.5mg  2.   F/U in AM  Marisue Humble, PharmD Clinical Pharmacist [facility]

## 2013-06-26 NOTE — Evaluation (Signed)
Physical Therapy Evaluation Patient Details Name: [name] MRN: [id number] DOB: September 02, 1941 Today's Date: 06/26/2013 Time: 7846-9629 PT Time Calculation (min): 22 min  PT Assessment / Plan / Recommendation History of Present Illness  Pt admitted with acute pulmonary edema.  She has dyalisis MWF.    Clinical Impression  Pt admitted with acute pulmonary edema. Pt currently with functional limitations due to the deficits listed below (see PT Problem List).  Pt will benefit from skilled PT to increase their independence and safety with mobility to allow discharge to the venue listed below. Pt's daughter is available PRN for supervision/assist when pt d/c'd home.      PT Assessment  Patient needs continued PT services    Follow Up Recommendations  Home health PT    Does the patient have the potential to tolerate intense rehabilitation      Barriers to Discharge        Equipment Recommendations  None recommended by PT    Recommendations for Other Services     Frequency Min 3X/week    Precautions / Restrictions Precautions Precautions: None Restrictions Weight Bearing Restrictions: No   Pertinent Vitals/Pain 0/10      Mobility  Bed Mobility Bed Mobility: Supine to Sit Supine to Sit: 4: Min assist Transfers Transfers: Sit to Stand;Stand to Sit Sit to Stand: 4: Min assist;From bed;From chair/3-in-1;With upper extremity assist Stand to Sit: 4: Min assist;With upper extremity assist;To chair/3-in-1 Details for Transfer Assistance: assist to control descent, verbal cues for hand placement Ambulation/Gait Ambulation/Gait Assistance: 4: Min guard Ambulation Distance (Feet): 65 Feet Assistive device: Rolling walker Ambulation/Gait Assistance Details: verbal cues to look forward and keep RW close Gait Pattern: Decreased stride length Gait velocity: decreased    Exercises     PT Diagnosis: Difficulty walking;Generalized weakness  PT Problem List: Decreased  strength;Decreased activity tolerance;Decreased mobility;Decreased balance PT Treatment Interventions: DME instruction;Gait training;Stair training;Functional mobility training;Therapeutic activities;Therapeutic exercise;Patient/family education     PT Goals(Current goals can be found in the care plan section) Acute Rehab PT Goals Patient Stated Goal: home PT Goal Formulation: With patient Time For Goal Achievement: 07/10/13 Potential to Achieve Goals: Good  Visit Information  Last PT Received On: 06/26/13 Assistance Needed: +1 History of Present Illness: Pt admitted with acute pulmonary edema.  She has dyalisis MWF.         Prior Functioning  Home Living Family/patient expects to be discharged to:: Private residence Living Arrangements: Alone Available Help at Discharge: Family;Available PRN/intermittently Type of Home: House Home Access: Stairs to enter [employer] of Steps: 1 Entrance Stairs-Rails: None Home Layout: One level Home Equipment: Environmental consultant - 2 wheels Prior Function Level of Independence: Independent Communication Communication: No difficulties    Cognition  Cognition Arousal/Alertness: Awake/alert Behavior During Therapy: WFL for tasks assessed/performed Overall Cognitive Status: Within Functional Limits for tasks assessed    Extremity/Trunk Assessment     Balance    End of Session PT - End of Session Equipment Utilized During Treatment: Gait belt Activity Tolerance: Patient limited by fatigue Patient left: in chair;with call bell/phone within reach Nurse Communication: Mobility status  GP     Ilda Foil 06/26/2013, 11:39 AM  Aida Raider, PT  Office # [phone] Pager [phone]

## 2013-06-26 NOTE — Progress Notes (Signed)
Subjective:  Her breathing is improved today and she is no longer on BiPAP. No chest discomfort. 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  Objective:  Vital Signs in the last 24 hours: BP 134/55  Pulse 74  Temp(Src) 98.5 F (36.9 C) (Oral)  Resp 21  Ht 5\' 4"  (1.626 m)  Wt 79.2 kg (174 lb 9.7 oz)  BMI 29.96 kg/m2  SpO2 93%  Physical Exam: Obese WF in NAD on Bipap Lungs:  Reduce breath sounds Cardiac:  Regular rhythm, normal S1 and S2, no S3, 1/6 murmur Abdomen:  Soft, nontender, no masses Extremities:  No edema present, fistula in right arm  Intake/Output from previous day: 09/27 0701 - 09/28 0700 In: 598 [P.O.:240; I.V.:358] Out: 3050 [Urine:50] Weight Filed Weights   06/25/13 0340 06/25/13 1522 06/26/13 0300  Weight: 81.8 kg (180 lb 5.4 oz) 78.3 kg (172 lb 9.9 oz) 79.2 kg (174 lb 9.7 oz)    Lab Results: Basic Metabolic Panel:  Recent Labs  16/10/96 0721 06/25/13 1214  NA 134* 136  K 5.3* 4.5  CL 96 98  CO2 27 28  GLUCOSE 103* 93  BUN 47* 27*  CREATININE 6.38* 4.77*    CBC:  Recent Labs  06/25/13 0435 06/26/13 0600  WBC 11.4* 12.5*  HGB 10.1* 11.8*  HCT 35.3* 40.4  MCV 110.7* 108.9*  PLT 159 194   BNP    Component Value Date/Time   PROBNP 12692.0* 04/13/2013 1644   Telemetry: Sinus rhythm.  One 4 beat run of VT overmight  Assessment/Plan:  1. PAF currently in normal sinus rhythm.  Being anticoagulated 2. Acute respiratory failure-resolved 3. 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  I would have a low threshold for amiodarone.     Darden Palmer  MD [facility]  06/26/2013, 9:53 AM

## 2013-06-26 NOTE — Progress Notes (Signed)
Pt transferred to 2W08 per MD order. Report called to receiving nurse and all questions answered.

## 2013-06-26 NOTE — Progress Notes (Signed)
[facility] Progress Note  Subjective:   Sitting up at bedside. Feels much better but not quite at baseline. Denies pain. No CPAP last night - says it was not in the room. Does not like to use her home machine. Encouraged.  Objective Filed Vitals:   06/25/13 2007 06/26/13 0046 06/26/13 0300 06/26/13 0759  BP:  136/49 135/47 134/55  Pulse:  75 79 74  Temp:  98.7 F (37.1 C) 98.5 F (36.9 C) 98.5 F (36.9 C)  TempSrc:  Oral Oral Oral  Resp:  19 20 21   Height:      Weight:   79.2 kg (174 lb 9.7 oz)   SpO2: 94% 95% 96% 93%   Physical Exam General: Obese, alert, cooperative, NAD Heart: RRR Lungs: Diffuse crackles bilat, diminished at bases, on 2L O2 via [place] Abdomen: Obese, soft, NT, nl BS Extremities: No LE edema Dialysis Access: RUA AVF + bruit  Outpatient HD: (MWF [facility])  4hr F160 400/1.5 81kg 2K/2.5Ca RUA AVF No heparin  Hectorol none EPO 7400 Venofer 50mg /wk   Assessment/Plan: 1. Acute resp failure/ hypercarbia - also residual pulm edema on last cxr. Had extra HD yest with 3kg off, down to 79kg, HD tomorrow to try to lower dry wt further.   2. Paroxsymal afib - Now in SR. On coreg. Dilt and Warfarin started per cardiology. Off IV metoprolol. Tolerating for now.  3. ESRD, usual HD MWF. K 4.5 4. Anemia Hb 11.8 > 9.8 < 11.6. On Aranesp 60 q Fri. Last Tsat 19% in late Aug on weekly Fe. Repeat iron studies. May need Fe load. 5. Metabolic bone disease - Ca 9 (9.8 corrected) ^P 6.4 Cont Phoslo 2 ac and monitor. Last PTH 184, no vit D 6. HTN/volume - SBPs 130s on coreg and dilt. Under edw s/p serial HD but still with crackles on exam. Net UF 3L on Sat. Ok for HD tomorrow. Lower edw at d/c 7. Nutrition - alb 2.9. Renal diet, multivitamin, nepro 8. Hx recurrent C. diff - on PO vanc per pharmacy, taper over 6 wks per ID 9. Hx COPD on home O2 10. DM 2- glipizide  Scot Jun. Broadus John, PA-C [facility] Pager [phone] 06/26/2013,10:36 AM  LOS: 7 days    Patient seen and examined.  Agree with assessment and plan as above.  Better after HD yest, continue to pull vol down with HD tomorrow. Vinson Moselle  MD Pager [phone]    Cell  [phone] 06/26/2013, 2:42 PM     Additional Objective Labs: Basic Metabolic Panel:  Recent Labs Lab 06/22/13 0701 06/23/13 0150 06/24/13 0721 06/25/13 1214  NA 140 139 134* 136  K 5.1 4.7 5.3* 4.5  CL 101 102 96 98  CO2 26 26 27 28   GLUCOSE 94 101* 103* 93  BUN 47* 27* 47* 27*  CREATININE 6.38* 4.09* 6.38* 4.77*  CALCIUM 8.5 8.4 8.9 9.0  PHOS 6.3*  --  6.4*  --    Liver Function Tests:  Recent Labs Lab 06/19/13 2221 06/22/13 0701 06/24/13 0721  AST 24  --   --   ALT 15  --   --   ALKPHOS 78  --   --   BILITOT 0.4  --   --   PROT 6.7  --   --   ALBUMIN 3.2* 3.0* 2.9*   No results found for this basename: LIPASE, AMYLASE,  in the last 168 hours CBC:  Recent Labs Lab 06/22/13 0709 06/23/13 0150 06/24/13 2130  06/25/13 0435 06/26/13 0600  WBC 11.6* 9.8 13.5* 11.4* 12.5*  HGB 10.1* 10.4* 9.8* 10.1* 11.8*  HCT 34.9* 36.2 33.7* 35.3* 40.4  MCV 109.1* 110.0* 109.8* 110.7* 108.9*  PLT 166 175 176 159 194   Blood Culture    Component Value Date/Time   SDES TRACHEAL ASPIRATE 10/19/2012 1838   SPECREQUEST NONE 10/19/2012 1838   CULT  Value: ABUNDANT MORAXELLA CATARRHALIS(BRANHAMELLA) Note: BETA LACTAMASE POSITIVE 10/19/2012 1838   REPTSTATUS 10/21/2012 FINAL 10/19/2012 1838    Cardiac Enzymes:  Recent Labs Lab 06/20/13 1300 06/20/13 2056 06/21/13 0350  TROPONINI <0.30 <0.30 <0.30   CBG:  Recent Labs Lab 06/24/13 2132 06/25/13 0719 06/25/13 1700 06/25/13 2140 06/26/13 0727  GLUCAP 116* 102* 103* 157* 85   Iron Studies: No results found for this basename: IRON, TIBC, TRANSFERRIN, FERRITIN,  in the last 72 hours @lablastinr3 @ Studies/Results: Dg Chest Port 1 View  06/24/2013   CLINICAL DATA:  Severe shortness of breath. Oxygen dependent. Former smoker. Prior  history of coronary artery stenting. Current history of hypertension, diabetes, and end-stage renal disease on hemodialysis.  EXAM: PORTABLE CHEST - 1 VIEW  COMPARISON:  06/21/2013, 06/19/2013, 12/28/2012, 01/10/2012 ( and [facility]). Two-view chest x-ray 04/13/2013 [facility] toggle case.  FINDINGS: Cardiac silhouette moderately enlarged. Mild diffuse interstitial pulmonary edema. No confluent airspace consolidation. No visible pleural effusions.  IMPRESSION: Mild CHF and/or fluid overload, with stable moderate cardiomegaly and mild diffuse interstitial pulmonary edema.   Electronically Signed   By: Hulan Saas   On: 06/24/2013 17:12   Medications:   . calcium acetate  1,334 mg Oral TID WC  . carvedilol  12.5 mg Oral BID WC  . darbepoetin (ARANESP) injection - DIALYSIS  60 mcg Intravenous Q Fri-HD  . diltiazem  180 mg Oral Daily  . docusate sodium  100 mg Oral BID  . ezetimibe  10 mg Oral QHS  . ferric gluconate (FERRLECIT/NULECIT) IV  125 mg Intravenous Q Fri-HD  . fluticasone  1 puff Inhalation BID  . insulin aspart  0-9 Units Subcutaneous TID WC  . LORazepam  0.5 mg Intravenous Once  . rOPINIRole  1 mg Oral BID WC  . sodium chloride  3 mL Intravenous Q12H  . vancomycin  125 mg Oral QID  . warfarin  7.5 mg Oral ONCE-1800  . Warfarin - Pharmacist Dosing Inpatient   Does not apply [phone]

## 2013-06-27 LAB — CBC
HCT: 35.9 % — ABNORMAL LOW (ref 36.0–46.0)
Hemoglobin: 11 g/dL — ABNORMAL LOW (ref 12.0–15.0)
MCH: 32.3 pg (ref 26.0–34.0)
MCV: 105.3 fL — ABNORMAL HIGH (ref 78.0–100.0)
RBC: 3.41 MIL/uL — ABNORMAL LOW (ref 3.87–5.11)
WBC: 14.4 10*3/uL — ABNORMAL HIGH (ref 4.0–10.5)

## 2013-06-27 LAB — RENAL FUNCTION PANEL
Albumin: 3.1 g/dL — ABNORMAL LOW (ref 3.5–5.2)
CO2: 27 mEq/L (ref 19–32)
Calcium: 9.4 mg/dL (ref 8.4–10.5)
GFR calc Af Amer: 8 mL/min — ABNORMAL LOW (ref >90)
Glucose, Bld: 103 mg/dL — ABNORMAL HIGH (ref 70–99)
Sodium: 133 mEq/L — ABNORMAL LOW (ref 135–145)

## 2013-06-27 LAB — BLOOD GAS, ARTERIAL
Bicarbonate: 28.7 mEq/L — ABNORMAL HIGH (ref 20.0–24.0)
Delivery systems: POSITIVE
Inspiratory PAP: 10
TCO2: 30.6 mmol/L (ref 0–100)
pCO2 arterial: 60.6 mmHg (ref 35.0–45.0)
pH, Arterial: 7.292 — ABNORMAL LOW (ref 7.350–7.450)
pO2, Arterial: 77.7 mmHg — ABNORMAL LOW (ref 80.0–100.0)

## 2013-06-27 LAB — IRON AND TIBC
Iron: 42 ug/dL (ref 42–135)
Saturation Ratios: 21 % (ref 20–55)
UIBC: 161 ug/dL (ref 125–400)

## 2013-06-27 MED ORDER — VANCOMYCIN HCL 125 MG PO CAPS: ORAL_CAPSULE | ORAL | Status: DC

## 2013-06-27 MED ORDER — WARFARIN SODIUM 7.5 MG PO TABS
7.5000 mg | ORAL_TABLET | Freq: Once | ORAL | Status: DC
  Filled 2013-06-27: qty 1

## 2013-06-27 NOTE — Discharge Summary (Signed)
Physician Discharge Summary  [name]:[id number] DOB: Sep 05, 1941 DOA: 06/19/2013  PCP: [name], MD  Admit date: 06/19/2013 Discharge date: 06/27/2013  Time spent: 45 minutes  Recommendations for Outpatient Follow-up:  1. Please follow up on patient's blood pressures as she was admitted for hypertensive emergency 2. Follow-up on PT/INR check, the patient started on Coumadin during this hospitalization, I instructed patient to present to her primary care physician's office on thursday, 06/30/2013 for PT/INR check.  Discharge Diagnoses:  Active Problems:   CKD (chronic kidney disease) stage V requiring chronic dialysis   DM2 (diabetes mellitus, type 2)   COPD (chronic obstructive pulmonary disease)   Acute respiratory failure with hypoxia   Acute pulmonary edema   C. difficile colitis   Chronic respiratory failure with hypoxia   HTN (hypertension)   Pulmonary HTN   OSA (obstructive sleep apnea)   Atrial fibrillation   Discharge Condition: Stable/improved  Diet recommendation: Renal diet  Filed Weights   06/27/13 0500 06/27/13 0645 06/27/13 1102  Weight: 79.379 kg (175 lb) 78.5 kg (173 lb 1 oz) 75.5 kg (166 lb 7.2 oz)    History of present illness:  72 y.o. female who presented initially to [facility]. She had markedly elevated BP's in the ED there with SBPs of 260, acute respiratory failure on BIPAP, was encephalopathic, and in severe pulmonary edema. She was put on Cardene gtt initially as well as given labetalol and NTG paste. She was transferred to [facility] at [facility]. By the time she arrived to [facility], she was off the BiPAP, off the cardene gtt, was normotensive, and had normal mentation. CCM asked medicine to admit patient given she no longer required ICU level.   Hospital Course:  Patient is a pleasant 72 year old female with a past medical history of end-stage renal disease, undergoing hemodialysis on Mondays Wednesdays and Fridays, history of hypertension, who  presented as a transfer from [facility] on 06/19/2013. Patient initially presented to that facility with hypertensive emergency, evidence by severe pulmonary edema, encephalopathy, acute hypoxemic respiratory failure requiring BiPAP. She was started on a Cardene drip at that facility as well as administered nitroglycerin and labetalol. Patient's blood pressures improving with Cardene drip, as this was discontinued by the time she arrived to this facility. Given significant improvement, rather than admission to intensive care unit, she was admitted to the step down unit. Cardiology and nephrology were consulted. She had presented in atrial fibrillation with rapid ventricular response for which Dr. Katrinka Blazing of cardiology recommended initiating anticoagulation given the presence of multiple risk factors for thromboembolism. It was conceivable that episodes of atrial fibrillation may have also precipitated acute Pulmonary Edema as well, and with her underlying cardiomyopathy, it was likely that patient had low tolerance to A. Fib with RVR.   With regard to her end-stage renal disease, Dr. Arlean Hopping of nephrology was consulted as she underwent her usual hemodialysis on Mondays Wednesdays and Fridays during this hospitalization. Her hospitalization was complicated by the development of an acute hypoxemic hypercarbic respiratory failure as a rapid response was called on 06/24/2013. Patient was placed on BiPAP, as chest x-ray showing persistent pulmonary edema. Nephrology recommending removal of extra volume during hemodialysis. With regard to her hypertension, she was maintained on Coreg, and started on diltiazem extend release 240 mg by mouth daily. Other issues during this hospitalization, patient having recurrent C. Difficile colitis, having 3 positive C. Difficile collections on 07/30/2012, 10/23/2012 and 06/20/2013. She was started on oral vancomycin. Infectious disease was consulted regarding vancomycin  taper.  Infectious disease recommended oral vancomycin 125 mg 4 times a day x1 week then 125 mg twice a day x1 week then on a 25 mg daily for one week then 25 mg every other day for one week then 125 mg every 3 days x14 days. Social work was consulted for prescription assistance on this medication. By 06/27/2013 she reported feeling well, having no chest discomfort, with significant improvement to her shortness of breath. At baseline she uses 2-3 L supplemental oxygen via nasal cannula, as she was requiring 1-2 L on the day of discharge. Telemetry was reviewed, there were no significant changes, without evidence of A. Fib with RVR.cardiology did recommend that if there are issues with recurrent agent for ablation amiodarone would be a possibility. Given significant clinical improvement, she was discharged in stable condition on 06/27/2013.   Procedures:  Patient undergoing hemodialysis during this hospitalization  Consultations:  Nephrology  Pulmonary critical care  Cardiology  Discharge Exam: Filed Vitals:   06/27/13 1102  BP: 135/57  Pulse: 72  Temp: 97.8 F (36.6 C)  Resp: 21    General: Patient is in no acute distress she is awake alert. She is sitting at bedside chair, supplemental oxygen at 2 L. She reports feeling much better and is requesting to go home today. Cardiovascular: regular rate and rhythm normal S1-S2 Respiratory: lungs overall clear to auscultation bilaterally no wheezing rhonchi or rales Abdomen: Soft nontender nondistended positive bowels Extremities: No edema  Discharge Instructions  Discharge Orders   Future Appointments Provider Department Dept Phone   08/30/2013 2:30 PM [facility] [address] [phone]   08/30/2013 3:40 PM Carma Lair Nickel, NP [facility] [phone]   Future Orders Complete By Expires   Call MD for:  difficulty breathing, headache or visual disturbances  As directed    Call MD for:   extreme fatigue  As directed    Call MD for:  persistant nausea and vomiting  As directed    Call MD for:  redness, tenderness, or signs of infection (pain, swelling, redness, odor or green/yellow discharge around incision site)  As directed    Call MD for:  severe uncontrolled pain  As directed    Diet - low sodium heart healthy  As directed    Diet - low sodium heart healthy  As directed    Discharge instructions  As directed    Comments:     Keep follow up appointments Take your medications as directed   Discharge instructions  As directed    Comments:     Please follow up at your doctor's office for a PT/INR level to check coumadin.   Increase activity slowly  As directed    Increase activity slowly  As directed        Medication List         albuterol (2.5 MG/3ML) 0.083% nebulizer solution  Commonly known as:  PROVENTIL  Take 2.5 mg by nebulization 2 (two) times daily.     calcium acetate 667 MG capsule  Commonly known as:  PHOSLO  Take 1,334 mg by mouth 3 (three) times daily with meals.     calcium carbonate 500 MG chewable tablet  Commonly known as:  TUMS - dosed in mg elemental calcium  Chew 2 tablets (400 mg of elemental calcium total) by mouth 3 (three) times daily as needed for heartburn.     carvedilol 12.5 MG tablet  Commonly known as:  COREG  Take 1 tablet (12.5 mg total) by mouth 2 (two) times daily with a meal. Hold this medication on the morning of dialysis but once dialysis treatment complete he may take later in the afternoon after dialysis treatment complete     diltiazem 240 MG 24 hr capsule  Commonly known as:  CARTIA XT  Take 1 capsule (240 mg total) by mouth daily. Please hold this medication on the morning of dialysis. He may take this medication watch her dialysis session is complete.     ezetimibe 10 MG tablet  Commonly known as:  ZETIA  Take 10 mg by mouth at bedtime.     fluticasone 110 MCG/ACT inhaler  Commonly known as:  FLOVENT HFA   Inhale 2 puffs into the lungs 3 (three) times daily as needed (for wheezing and shortness of breath).     glipiZIDE 5 MG tablet  Commonly known as:  GLUCOTROL  Take 5 mg by mouth daily as needed (if CBG is >150).     ipratropium 0.02 % nebulizer solution  Commonly known as:  ATROVENT  Take 500 mcg by nebulization every 6 (six) hours as needed for wheezing.     rOPINIRole 1 MG tablet  Commonly known as:  REQUIP  Take 1 mg by mouth 2 (two) times daily.     vancomycin 125 MG capsule  Commonly known as:  VANCOCIN HCL  - 125 mg PO QID for 1 week, followed by 125 mg PO BID for 1 week, then 125 mg PO daily for 1 week, then 125 mg PO every other day for 1 week, then 125 mg q 3 days for 14 days  - Qty Suf for taper     warfarin 5 MG tablet  Commonly known as:  COUMADIN  Take one tab daily at supper or as adjusted by [facility]       Allergies  Allergen Reactions  . Morphine And Related Nausea And Vomiting  . Lipitor [Atorvastatin] Other (See Comments)    Causes drowsiness  . Nsaids     Unknown reported by previous hospital       Follow-up Information   Follow up with [name], MD.   Specialty:  Internal Medicine   Contact information:   [address] [place] [address] [phone]       Schedule an appointment as soon as possible for a visit with [facility], MD. (He be seen this Monday to have PT INR checked since new start on Coumadin and to discuss recent atrial fibrillation)    Specialty:  Cardiology   Contact information:   [address] [place] [address] [phone]        The results of significant diagnostics from this hospitalization (including imaging, microbiology, ancillary and laboratory) are listed below for reference.    Significant Diagnostic Studies: Dg Chest Port 1 View  06/24/2013   CLINICAL DATA:  Severe shortness of breath. Oxygen dependent. Former smoker. Prior history of coronary artery stenting. Current  history of hypertension, diabetes, and end-stage renal disease on hemodialysis.  EXAM: PORTABLE CHEST - 1 VIEW  COMPARISON:  06/21/2013, 06/19/2013, 12/28/2012, 01/10/2012 ([facility]). Two-view chest x-ray 04/13/2013 [facility] toggle case.  FINDINGS: Cardiac silhouette moderately enlarged. Mild diffuse interstitial pulmonary edema. No confluent airspace consolidation. No visible pleural effusions.  IMPRESSION: Mild CHF and/or fluid overload, with stable moderate cardiomegaly and mild diffuse interstitial pulmonary edema.   Electronically Signed   By: Hulan Saas   On: 06/24/2013 17:12  Dg Chest Port 1 View  06/21/2013   CLINICAL DATA:  Followup pulmonary edema  EXAM: PORTABLE CHEST - 1 VIEW  COMPARISON:  06/19/2013  FINDINGS: Irregular interstitial thickening has mildly improved. Hazy perihilar airspace opacity has resolved.  The cardiac silhouette is mildly enlarged. No mediastinal or hilar masses are noted.  IMPRESSION: Improved pulmonary edema.   Electronically Signed   By: Amie Portland   On: 06/21/2013 13:52   Dg Abd Portable 1v  06/21/2013   *RADIOLOGY REPORT*  Clinical Data: Abdominal discomfort.  Possible colonic thickening.  PORTABLE ABDOMEN - 1 VIEW  Comparison:  None.  Findings: The bowel gas pattern is normal.  No radio-opaque calculi or other significant radiographic abnormality are seen. Advanced vascular calcification.  Surgical clips right mid abdomen.  IMPRESSION: Negative for acute abnormality.   Original Report Authenticated By: Davonna Belling, M.D.    Microbiology: Recent Results (from the past 240 hour(s))  MRSA PCR SCREENING     Status: None   Collection Time    06/19/13  9:01 PM      Result Value Range Status   MRSA by PCR NEGATIVE  NEGATIVE Final   Comment:            The GeneXpert MRSA Assay (FDA     approved for NASAL specimens     only), is one component of a     comprehensive MRSA colonization     surveillance program. It is not     intended to  diagnose MRSA     infection nor to guide or     monitor treatment for     MRSA infections.  CLOSTRIDIUM DIFFICILE BY PCR     Status: Abnormal   Collection Time    06/20/13 11:18 AM      Result Value Range Status   C difficile by pcr POSITIVE (*) NEGATIVE Final   Comment: CRITICAL RESULT CALLED TO, READ BACK BY AND VERIFIED WITH:     Aretha Parrot RN 14:25 06/20/13 ([email or web address])  MRSA PCR SCREENING     Status: None   Collection Time    06/24/13  6:06 PM      Result Value Range Status   MRSA by PCR NEGATIVE  NEGATIVE Final   Comment:            The GeneXpert MRSA Assay (FDA     approved for NASAL specimens     only), is one component of a     comprehensive MRSA colonization     surveillance program. It is not     intended to diagnose MRSA     infection nor to guide or     monitor treatment for     MRSA infections.     Labs: Basic Metabolic Panel:  Recent Labs Lab 06/22/13 0701 06/23/13 0150 06/24/13 0721 06/25/13 1214 06/27/13 0620  NA 140 139 134* 136 133*  K 5.1 4.7 5.3* 4.5 4.2  CL 101 102 96 98 93*  CO2 26 26 27 28 27   GLUCOSE 94 101* 103* 93 103*  BUN 47* 27* 47* 27* 46*  CREATININE 6.38* 4.09* 6.38* 4.77* 5.67*  CALCIUM 8.5 8.4 8.9 9.0 9.4  MG  --  2.3  --   --   --   PHOS 6.3*  --  6.4*  --  4.3   Liver Function Tests:  Recent Labs Lab 06/22/13 0701 06/24/13 0721 06/27/13 0620  ALBUMIN 3.0* 2.9* 3.1*   No results found for this basename: LIPASE,  AMYLASE,  in the last 168 hours No results found for this basename: AMMONIA,  in the last 168 hours CBC:  Recent Labs Lab 06/23/13 0150 06/24/13 0721 06/25/13 0435 06/26/13 0600 06/27/13 0619  WBC 9.8 13.5* 11.4* 12.5* 14.4*  HGB 10.4* 9.8* 10.1* 11.8* 11.0*  HCT 36.2 33.7* 35.3* 40.4 35.9*  MCV 110.0* 109.8* 110.7* 108.9* 105.3*  PLT 175 176 159 194 209   Cardiac Enzymes:  Recent Labs Lab 06/20/13 2056 06/21/13 0350  TROPONINI <0.30 <0.30   BNP: BNP (last 3 results)  Recent Labs   08/19/12 2020 10/19/12 1605 04/13/13 1644  PROBNP 10460.0* 17356.0* 12692.0*   CBG:  Recent Labs Lab 06/26/13 0727 06/26/13 1334 06/26/13 1635 06/26/13 2117 06/27/13 1134  GLUCAP 85 156* 137* 86 117*       Signed:  Coretta Leisey  [facility] 06/27/2013, 2:06 PM

## 2013-06-27 NOTE — Procedures (Signed)
Patient was seen on dialysis and the procedure was supervised. BFR 400 Via RUE AVG BP is 174/71.  Patient appears to be tolerating treatment well and is asking about going home today after HD.

## 2013-06-27 NOTE — Progress Notes (Addendum)
Patient Name: [name] Date of Encounter: 06/27/2013    SUBJECTIVE:she feels well. She was to go home. No chest pain. Breathing is improved.  TELEMETRY:  No recent significant recurrence of atrial fib.: Filed Vitals:   06/27/13 0930 06/27/13 1000 06/27/13 1030 06/27/13 1102  BP: 114/55 126/56 100/46 135/57  Pulse: 76 71 70 72  Temp:    97.8 F (36.6 C)  TempSrc:    Oral  Resp: 17 22 23 21   Height:      Weight:    75.5 kg (166 lb 7.2 oz)  SpO2:    98%    Intake/Output Summary (Last 24 hours) at 06/27/13 1246 Last data filed at 06/27/13 1102  Gross per 24 hour  Intake    240 ml  Output   3000 ml  Net  -2760 ml    LABS: Basic Metabolic Panel:  Recent Labs  95/62/13 1214 06/27/13 0620  NA 136 133*  K 4.5 4.2  CL 98 93*  CO2 28 27  GLUCOSE 93 103*  BUN 27* 46*  CREATININE 4.77* 5.67*  CALCIUM 9.0 9.4  PHOS  --  4.3   CBC:  Recent Labs  06/26/13 0600 06/27/13 0619  WBC 12.5* 14.4*  HGB 11.8* 11.0*  HCT 40.4 35.9*  MCV 108.9* 105.3*  PLT 194 209    Radiology/Studies:  No new data  Physical Exam: Blood pressure 135/57, pulse 72, temperature 97.8 F (36.6 C), temperature source Oral, resp. rate 21, height 5\' 4"  (1.626 m), weight 75.5 kg (166 lb 7.2 oz), SpO2 98.00%. Weight change: 1.08 kg (2 lb 6.1 oz)   unchanged  ASSESSMENT:  1. 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 Acute on chronic diastolic heart failure, improved with aggressive dialysis   3. No recurrence of atrial fibrillation, but high risk for embolic stroke   Plan:  1. Agree with anticoagulation  2. If recurrent atrial ffibrillation would start amiodarone  3. Dr. Leonette Nutting, [place] [facility] is her cardiologist in  [facility]  Signed, Veatrice Kells W 06/27/2013, 12:46 PM

## 2013-06-27 NOTE — Progress Notes (Signed)
Patient ID: [name], female   DOB: [birth year], 72 y.o.   MRN: [id number]  [facility] Progress Note    Subjective:   Feels better and wants to know if she can go home today   Objective:   BP 183/74  Pulse 72  Temp(Src) 98.4 F (36.9 C) (Oral)  Resp 23  Ht 5\' 4"  (1.626 m)  Wt 78.5 kg (173 lb 1 oz)  BMI 29.69 kg/m2  SpO2 98%  Intake/Output: I/O last 3 completed shifts: In: 610 [P.O.:480; I.V.:130] Out: 50 [Urine:50]   Intake/Output this shift:    Weight change: 1.08 kg (2 lb 6.1 oz)  Physical Exam: Gen:WD elderly WF in NAD CVS:no rub Resp:CTA WUJ:WJXBJY Ext:no edema, RUE AVF +T/B  Labs: BMET  Recent Labs Lab 06/22/13 0701 06/23/13 0150 06/24/13 0721 06/25/13 1214 06/27/13 0620  NA 140 139 134* 136 133*  K 5.1 4.7 5.3* 4.5 4.2  CL 101 102 96 98 93*  CO2 26 26 27 28 27   GLUCOSE 94 101* 103* 93 103*  BUN 47* 27* 47* 27* 46*  CREATININE 6.38* 4.09* 6.38* 4.77* 5.67*  ALBUMIN 3.0*  --  2.9*  --  3.1*  CALCIUM 8.5 8.4 8.9 9.0 9.4  PHOS 6.3*  --  6.4*  --  4.3   CBC  Recent Labs Lab 06/24/13 0721 06/25/13 0435 06/26/13 0600 06/27/13 0619  WBC 13.5* 11.4* 12.5* 14.4*  HGB 9.8* 10.1* 11.8* 11.0*  HCT 33.7* 35.3* 40.4 35.9*  MCV 109.8* 110.7* 108.9* 105.3*  PLT 176 159 194 209    @IMGRELPRIORS @ Medications:    . calcium acetate  1,334 mg Oral TID WC  . carvedilol  12.5 mg Oral BID WC  . darbepoetin (ARANESP) injection - DIALYSIS  60 mcg Intravenous Q Fri-HD  . diltiazem  180 mg Oral Daily  . docusate sodium  100 mg Oral BID  . ezetimibe  10 mg Oral QHS  . ferric gluconate (FERRLECIT/NULECIT) IV  125 mg Intravenous Q Fri-HD  . fluticasone  1 puff Inhalation BID  . insulin aspart  0-9 Units Subcutaneous TID WC  . LORazepam  0.5 mg Intravenous Once  . rOPINIRole  1 mg Oral BID WC  . sodium chloride  3 mL Intravenous Q12H  . vancomycin  125 mg Oral QID  . Warfarin - Pharmacist Dosing Inpatient   Does not apply q1800    Outpatient HD: (MWF [facility])  4hr F160 400/1.5 [EDW was 81kg now will be 75.5kg] 2K/2.5Ca RUA AVF No heparin  Hectorol none EPO 7400 Venofer 50mg /wk   Assessment/ Plan:   1. Malignant HTN and pulmonary edema- markedly improved with meds and UF with HD.  EDW is now 5kg below what it was as an outpt.  2. ESRD cont with HD qMWF with new lower EDW of 75.5kg 3. Resp acidosis- was on BiPap, appreciate PCCM input. 4. Anemia:cont withESA and IV Iron 5. Vascular access- RUE AVF stable 6. Nutrition:will need ONS and protein supplement 7. Hypertension:improved with UF and meds.  May need to stop Na modeling as an oupt if BP increases with HD.  8. Paroxsymal afib - Now in SR. On coreg. Dilt and Warfarin started per cardiology. Off IV metoprolol. Tolerating for now. 9. Hx recurrent C. diff - on PO vanc per pharmacy, taper over 6 wks per ID Hx  10. COPD on home O2 but nonadherent with CPAP 11. DM 2- glipizide 12. Disposition- per Primary svc but stable from renal standpoint.   Jahan Friedlander  A 06/27/2013, 8:28 AM

## 2013-06-27 NOTE — Progress Notes (Signed)
ANTICOAGULATION CONSULT NOTE - Follow Up Consult  Pharmacy Consult for Warfarin Indication: atrial fibrillation  Allergies  Allergen Reactions  . Morphine And Related Nausea And Vomiting  . Lipitor [Atorvastatin] Other (See Comments)    Causes drowsiness  . Nsaids     Unknown reported by previous hospital    Patient Measurements: Height: 5\' 4"  (162.6 cm) Weight: 173 lb 1 oz (78.5 kg) (standing) IBW/kg (Calculated) : 54.7  Vital Signs: Temp: 98.4 F (36.9 C) (09/29 0645) Temp src: Oral (09/29 0645) BP: 100/46 mmHg (09/29 1030) Pulse Rate: 70 (09/29 1030)  Labs:  Recent Labs  06/25/13 0435 06/25/13 1214 06/26/13 0600 06/27/13 0405 06/27/13 0619 06/27/13 0620  HGB 10.1*  --  11.8*  --  11.0*  --   HCT 35.3*  --  40.4  --  35.9*  --   PLT 159  --  194  --  209  --   LABPROT 13.9  --  13.3 15.4*  --   --   INR 1.09  --  1.03 1.25  --   --   HEPARINUNFRC 0.19*  --  <0.10*  --   --   --   CREATININE  --  4.77*  --   --   --  5.67*    Estimated Creatinine Clearance: 9.2 ml/min (by C-G formula based on Cr of 5.67).  Assessment: 72 y/o F with new afib starting warfarin. INR 1.25<1.03, Hgb 11, Plts 209, ESRD on HD MWF, no overt bleeding.   Goal of Therapy:  INR 2-3 Monitor platelets by anticoagulation protocol: Yes   Plan:  -Warfarin 7.5 mg PO x 1 -Daily PT/INR -Monitor for bleeding in HD patient  Thank you for allowing me to take part in this patient's care,  Abran Duke, PharmD Clinical Pharmacist Phone: [phone] Pager: [phone] 06/27/2013 10:58 AM

## 2013-08-04 ENCOUNTER — Other Ambulatory Visit: Payer: Self-pay

## 2013-08-27 ENCOUNTER — Inpatient Hospital Stay (HOSPITAL_COMMUNITY)
Admission: EM | Admit: 2013-08-27 | Discharge: 2013-08-31 | DRG: 291 | Disposition: A | Discharge status: Home / Self Care | Payer: Medicare Other | Attending: Internal Medicine | Admitting: Internal Medicine

## 2013-08-27 ENCOUNTER — Emergency Department (HOSPITAL_COMMUNITY): Payer: Medicare Other

## 2013-08-27 ENCOUNTER — Encounter (HOSPITAL_COMMUNITY): Payer: Self-pay | Admitting: Emergency Medicine

## 2013-08-27 DIAGNOSIS — I2510 Atherosclerotic heart disease of native coronary artery without angina pectoris: Secondary | ICD-10-CM | POA: Diagnosis present

## 2013-08-27 DIAGNOSIS — Z992 Dependence on renal dialysis: Secondary | ICD-10-CM

## 2013-08-27 DIAGNOSIS — N186 End stage renal disease: Secondary | ICD-10-CM

## 2013-08-27 DIAGNOSIS — Z87891 Personal history of nicotine dependence: Secondary | ICD-10-CM

## 2013-08-27 DIAGNOSIS — I252 Old myocardial infarction: Secondary | ICD-10-CM

## 2013-08-27 DIAGNOSIS — G4730 Sleep apnea, unspecified: Secondary | ICD-10-CM | POA: Diagnosis present

## 2013-08-27 DIAGNOSIS — I739 Peripheral vascular disease, unspecified: Secondary | ICD-10-CM | POA: Diagnosis present

## 2013-08-27 DIAGNOSIS — N2581 Secondary hyperparathyroidism of renal origin: Secondary | ICD-10-CM | POA: Diagnosis present

## 2013-08-27 DIAGNOSIS — Z905 Acquired absence of kidney: Secondary | ICD-10-CM

## 2013-08-27 DIAGNOSIS — J810 Acute pulmonary edema: Secondary | ICD-10-CM

## 2013-08-27 DIAGNOSIS — I4891 Unspecified atrial fibrillation: Secondary | ICD-10-CM

## 2013-08-27 DIAGNOSIS — M899 Disorder of bone, unspecified: Secondary | ICD-10-CM | POA: Diagnosis present

## 2013-08-27 DIAGNOSIS — G4733 Obstructive sleep apnea (adult) (pediatric): Secondary | ICD-10-CM

## 2013-08-27 DIAGNOSIS — E213 Hyperparathyroidism, unspecified: Secondary | ICD-10-CM

## 2013-08-27 DIAGNOSIS — Z9981 Dependence on supplemental oxygen: Secondary | ICD-10-CM

## 2013-08-27 DIAGNOSIS — K219 Gastro-esophageal reflux disease without esophagitis: Secondary | ICD-10-CM | POA: Diagnosis present

## 2013-08-27 DIAGNOSIS — J811 Chronic pulmonary edema: Secondary | ICD-10-CM | POA: Diagnosis present

## 2013-08-27 DIAGNOSIS — R001 Bradycardia, unspecified: Secondary | ICD-10-CM

## 2013-08-27 DIAGNOSIS — Z8614 Personal history of Methicillin resistant Staphylococcus aureus infection: Secondary | ICD-10-CM

## 2013-08-27 DIAGNOSIS — I498 Other specified cardiac arrhythmias: Secondary | ICD-10-CM | POA: Diagnosis present

## 2013-08-27 DIAGNOSIS — D62 Acute posthemorrhagic anemia: Secondary | ICD-10-CM

## 2013-08-27 DIAGNOSIS — I10 Essential (primary) hypertension: Secondary | ICD-10-CM

## 2013-08-27 DIAGNOSIS — R079 Chest pain, unspecified: Secondary | ICD-10-CM

## 2013-08-27 DIAGNOSIS — I5033 Acute on chronic diastolic (congestive) heart failure: Principal | ICD-10-CM

## 2013-08-27 DIAGNOSIS — R55 Syncope and collapse: Secondary | ICD-10-CM

## 2013-08-27 DIAGNOSIS — I120 Hypertensive chronic kidney disease with stage 5 chronic kidney disease or end stage renal disease: Secondary | ICD-10-CM | POA: Diagnosis present

## 2013-08-27 DIAGNOSIS — E119 Type 2 diabetes mellitus without complications: Secondary | ICD-10-CM

## 2013-08-27 DIAGNOSIS — G9340 Encephalopathy, unspecified: Secondary | ICD-10-CM

## 2013-08-27 DIAGNOSIS — J9620 Acute and chronic respiratory failure, unspecified whether with hypoxia or hypercapnia: Secondary | ICD-10-CM

## 2013-08-27 DIAGNOSIS — J4489 Other specified chronic obstructive pulmonary disease: Secondary | ICD-10-CM | POA: Diagnosis present

## 2013-08-27 DIAGNOSIS — R791 Abnormal coagulation profile: Secondary | ICD-10-CM | POA: Diagnosis present

## 2013-08-27 DIAGNOSIS — T45515A Adverse effect of anticoagulants, initial encounter: Secondary | ICD-10-CM | POA: Diagnosis present

## 2013-08-27 DIAGNOSIS — D631 Anemia in chronic kidney disease: Secondary | ICD-10-CM

## 2013-08-27 DIAGNOSIS — I509 Heart failure, unspecified: Secondary | ICD-10-CM | POA: Diagnosis present

## 2013-08-27 DIAGNOSIS — J449 Chronic obstructive pulmonary disease, unspecified: Secondary | ICD-10-CM

## 2013-08-27 DIAGNOSIS — Z9861 Coronary angioplasty status: Secondary | ICD-10-CM

## 2013-08-27 DIAGNOSIS — I2789 Other specified pulmonary heart diseases: Secondary | ICD-10-CM | POA: Diagnosis present

## 2013-08-27 DIAGNOSIS — Z7901 Long term (current) use of anticoagulants: Secondary | ICD-10-CM

## 2013-08-27 DIAGNOSIS — Z85528 Personal history of other malignant neoplasm of kidney: Secondary | ICD-10-CM

## 2013-08-27 DIAGNOSIS — J9601 Acute respiratory failure with hypoxia: Secondary | ICD-10-CM

## 2013-08-27 LAB — CBC
HCT: 33.3 % — ABNORMAL LOW (ref 36.0–46.0)
Hemoglobin: 9.8 g/dL — ABNORMAL LOW (ref 12.0–15.0)
MCH: 32.3 pg (ref 26.0–34.0)
MCHC: 29.4 g/dL — ABNORMAL LOW (ref 30.0–36.0)
RDW: 16 % — ABNORMAL HIGH (ref 11.5–15.5)

## 2013-08-27 LAB — PRO B NATRIURETIC PEPTIDE: Pro B Natriuretic peptide (BNP): 14565 pg/mL — ABNORMAL HIGH (ref 0–125)

## 2013-08-27 LAB — PROTIME-INR: INR: 0.93 (ref 0.00–1.49)

## 2013-08-27 LAB — BASIC METABOLIC PANEL
BUN: 41 mg/dL — ABNORMAL HIGH (ref 6–23)
Calcium: 9.1 mg/dL (ref 8.4–10.5)
Creatinine, Ser: 5.47 mg/dL — ABNORMAL HIGH (ref 0.50–1.10)
GFR calc Af Amer: 8 mL/min — ABNORMAL LOW (ref >90)
GFR calc non Af Amer: 7 mL/min — ABNORMAL LOW (ref >90)
Glucose, Bld: 115 mg/dL — ABNORMAL HIGH (ref 70–99)

## 2013-08-27 LAB — MAGNESIUM: Magnesium: 2.3 mg/dL (ref 1.5–2.5)

## 2013-08-27 MED ORDER — GLUCAGON HCL (RDNA) 1 MG IJ SOLR: 1.0000 mg | Freq: Once | INTRAMUSCULAR | Status: DC

## 2013-08-27 MED ORDER — ATROPINE SULFATE 1 MG/ML IJ SOLN
INTRAMUSCULAR | Status: AC
  Filled 2013-08-27: qty 1

## 2013-08-27 MED ORDER — METHYLPREDNISOLONE SODIUM SUCC 125 MG IJ SOLR
125.0000 mg | Freq: Once | INTRAMUSCULAR | Status: AC
Start: 2013-08-27 — End: 2013-08-27
  Administered 2013-08-27: 125 mg via INTRAVENOUS
  Filled 2013-08-27: qty 2

## 2013-08-27 MED ORDER — SODIUM CHLORIDE 0.9 % IV SOLN
1.0000 g | INTRAVENOUS | Status: AC
  Administered 2013-08-27: 1 g via INTRAVENOUS
  Filled 2013-08-27: qty 10

## 2013-08-27 MED ORDER — GLUCAGON HCL (RDNA) 1 MG IJ SOLR: 1.0000 mg | Freq: Once | INTRAMUSCULAR | Status: DC | PRN

## 2013-08-27 MED ORDER — GLUCAGON HCL (RDNA) 1 MG IJ SOLR
INTRAMUSCULAR | Status: AC
  Filled 2013-08-27: qty 1

## 2013-08-27 MED ORDER — ATROPINE SULFATE 1 MG/ML IJ SOLN
INTRAMUSCULAR | Status: DC | PRN
  Administered 2013-08-27: 1 mg via INTRAVENOUS

## 2013-08-27 MED ORDER — ALBUTEROL (5 MG/ML) CONTINUOUS INHALATION SOLN
10.0000 mg/h | INHALATION_SOLUTION | Freq: Once | RESPIRATORY_TRACT | Status: AC
  Administered 2013-08-27: 10 mg/h via RESPIRATORY_TRACT
  Filled 2013-08-27: qty 20

## 2013-08-27 MED ORDER — ATROPINE SULFATE 1 MG/ML IJ SOLN: 1.0000 mg | Freq: Once | INTRAMUSCULAR | Status: DC

## 2013-08-27 MED ORDER — ASPIRIN 81 MG PO CHEW
324.0000 mg | CHEWABLE_TABLET | Freq: Once | ORAL | Status: AC
  Administered 2013-08-27: 324 mg via ORAL
  Filled 2013-08-27: qty 4

## 2013-08-27 MED ORDER — GLUCAGON HCL (RDNA) 1 MG IJ SOLR
1.0000 mg | Freq: Once | INTRAMUSCULAR | Status: AC
  Administered 2013-08-27: 1 mg via INTRAVENOUS

## 2013-08-27 NOTE — ED Provider Notes (Signed)
TIME SEEN: 10:15 PM  CHIEF COMPLAINT: Chest pain, shortness of breath, near syncope  HPI: Patient is a 72 year old female with a history of hypertension, A. fib on Coumadin, diabetes, COPD on 3 L of oxygen chronically, prior MI, CHF, end-stage renal disease on hemodialysis Monday, Wednesday and Friday he presents the emergency department with chest pain, shortness of breath and near syncope at home today. Patient reports that she has been feeling tired and weak all day today. She states she was watching her granddaughter and when she got up to get her granddaughter something TEE she began developing a substernal chest pressure without radiation, felt short of breath and felt like she was going to pass out. She reports this lasted several minutes but improved with rest. She states with any mild exertion she had similar symptoms. She states her last stress test was many years ago. She states this does not feel like her COPD exacerbations as she feels that her breathing at rest is at baseline currently. Denies any lower extremity swelling or pain. No prior history of PE or DVT. No recent fever or cough.  ROS: See HPI Constitutional: no fever  Eyes: no drainage  ENT: no runny nose   Cardiovascular:  chest pain  Resp: SOB  GI: no vomiting GU: no dysuria Integumentary: no rash  Allergy: no hives  Musculoskeletal: no leg swelling  Neurological: no slurred speech ROS otherwise negative  PAST MEDICAL HISTORY/PAST SURGICAL HISTORY:  Past Medical History  Diagnosis Date  . Carotid artery occlusion     bilat CEA in 2012?  . C. 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 Hypertension   . Atrial fibrillation April  2013  . Pulmonary hypertension   . Secondary hyperparathyroidism, renal   . COPD (chronic obstructive pulmonary disease)   . Hypercholesterolemia   . History of MI (myocardial infarction)     "they say I had 2 years ago" (08/20/2012)  . GERD (gastroesophageal reflux  disease)   . History of pneumonia     "have it alot" (08/20/2012)  . Sleep apnea     "don't wear mask anymore"  . Type II diabetes mellitus   . History of blood transfusion 2013    "3 times so far in 2013:  4 pints one time; 2 pints another; ?# last time" (08/20/2012)  . Iron deficiency anemia   . Seizures 2012    after carotid surgery  . ESRD on dialysis     "[place]; Tues, Thurs; Sat"  . Renal cell carcinoma 2005?  Marland Kitchen On home oxygen therapy     "24/7"   . Acute diverticulitis     Oct 2013, treated medically [facility], sigmoid diverticulitits  . CHF (congestive heart failure)     MEDICATIONS:  Prior to Admission medications   Medication Sig Start Date End Date Taking? Authorizing Provider  albuterol (PROVENTIL) (2.5 MG/3ML) 0.083% nebulizer solution Take 2.5 mg by nebulization 2 (two) times daily.    Historical Provider, MD  calcium acetate (PHOSLO) 667 MG capsule Take 1,334 mg by mouth 3 (three) times daily with meals.    Historical Provider, MD  calcium carbonate (TUMS - DOSED IN MG ELEMENTAL CALCIUM) 500 MG chewable tablet Chew 2 tablets (400 mg of elemental calcium total) by mouth 3 (three) times daily as needed for heartburn. 06/24/13   Russella Dar, NP  carvedilol (COREG) 12.5 MG tablet Take 1 tablet (12.5 mg total) by mouth 2 (two) times daily with  a meal. Hold this medication on the morning of dialysis but once dialysis treatment complete he may take later in the afternoon after dialysis treatment complete 06/24/13   Russella Dar, NP  diltiazem (CARTIA XT) 240 MG 24 hr capsule Take 1 capsule (240 mg total) by mouth daily. Please hold this medication on the morning of dialysis. He may take this medication watch her dialysis session is complete. 06/24/13   Russella Dar, NP  ezetimibe (ZETIA) 10 MG tablet Take 10 mg by mouth at bedtime.     Historical Provider, MD  fluticasone (FLOVENT HFA) 110 MCG/ACT inhaler Inhale 2 puffs into the lungs 3 (three) times daily as  needed (for wheezing and shortness of breath).    Historical Provider, MD  glipiZIDE (GLUCOTROL) 5 MG tablet Take 5 mg by mouth daily as needed (if CBG is >150).    Historical Provider, MD  ipratropium (ATROVENT) 0.02 % nebulizer solution Take 500 mcg by nebulization every 6 (six) hours as needed for wheezing.    Historical Provider, MD  rOPINIRole (REQUIP) 1 MG tablet Take 1 mg by mouth 2 (two) times daily.    Historical Provider, MD  vancomycin (VANCOCIN HCL) 125 MG capsule 125 mg PO QID for 1 week, followed by 125 mg PO BID for 1 week, then 125 mg PO daily for 1 week, then 125 mg PO every other day for 1 week, then 125 mg q 3 days for 14 days Qty Suf for taper 06/27/13   Jeralyn Bennett, MD  warfarin (COUMADIN) 5 MG tablet Take one tab daily at supper or as adjusted by [facility] 06/24/13   Russella Dar, NP    ALLERGIES:  Allergies  Allergen Reactions  . Morphine And Related Nausea And Vomiting  . Lipitor [Atorvastatin] Other (See Comments)    Causes drowsiness  . Nsaids     Unknown reported by previous hospital    SOCIAL HISTORY:  History  Substance Use Topics  . Smoking status: Former Smoker -- 1.50 packs/day for 45 years    Types: Cigarettes    Quit date: 09/30/2003  . Smokeless tobacco: Never Used  . Alcohol Use: No    FAMILY HISTORY: Family History  Problem Relation Age of Onset  . Cancer Mother     pancreatic  . Stroke Father   . Coronary artery disease Father   . Cancer Brother     Brain  . Kidney disease Brother     Kidney stones    EXAM: BP 120/50  Temp(Src) 98.1 F (36.7 C) (Oral)  Resp 19  SpO2 95% CONSTITUTIONAL: Alert and oriented and responds appropriately to questions. Well-appearing; well-nourished HEAD: Normocephalic EYES: Conjunctivae clear, PERRL ENT: normal nose; no rhinorrhea; moist mucous membranes; pharynx without lesions noted NECK: Supple, no meningismus, no LAD  CARD: Bradycardic; S1 and S2 appreciated; no murmurs, no clicks, no  rubs, no gallops RESP: Normal chest excursion without splinting or tachypnea; breath sounds diminished at bilateral bases with diffuse expiratory wheeze, no rhonchi or rales ABD/GI: Normal bowel sounds; non-distended; soft, non-tender, no rebound, no guarding BACK:  The back appears normal and is non-tender to palpation, there is no CVA tenderness EXT: Normal ROM in all joints; non-tender to palpation; no edema; normal capillary refill; no cyanosis    SKIN: Normal color for age and race; warm NEURO: Moves all extremities equally PSYCH: The patient's mood and manner are appropriate. Grooming and personal hygiene are appropriate.  MEDICAL DECISION MAKING: Patient here with chest pain,  shortness breath and near syncopal event. She has a history of coronary artery disease. Will check EKG, cardiac labs, chest x-ray. We'll also give albuterol treatment given her wheezing, diminished aeration. Patient will need admission.  ED PROGRESS: EKG shows junctional rhythm. Patient is now having episodes of significant pauses where she becomes very symptomatic with chest pain and then becomes unresponsive. Patient is on amiodarone, diltiazem and carvedilol. She reports she has taken her diltiazem carvedilol this morning. We'll give atropine, glucagon and calcium. Will place transcutaneous pacer pads on patient. Her cardiologist is Dr. Dulce Sellar in [place].  Troponin is negative. Will discuss with cardiology for evaluation.   Discussed with Dr. Katha Cabal with cardiology who agrees with plan and recommends medicine admission. He feels this is likely an echogenic given she is on diltiazem and carvedilol and amiodarone. Recommends holding these medications at this time.   Labs show leukocytosis which is chronic per patient. Potassium is 4.2. Creatinine is baseline.  Pt stable.  Will d/w medicine for admission.   Date: 08/27/2013 22:03  Rate: 49  Rhythm: Junctional rhythm  QRS Axis: normal  Intervals: normal  ST/T  Wave abnormalities: isolated T wave inversion in avL  Conduction Disutrbances: none  Narrative Interpretation: Junctional rhythm that is new compared to prior EKG in September 2014, Q waves in anterior leads and T wave inversion isolated in aVL that is old compared to prior EKG September 2014      CRITICAL CARE Performed by: Raelyn Number   Total critical care time: 30 minutes  Critical care time was exclusive of separately billable procedures and treating other patients.  Critical care was necessary to treat or prevent imminent or life-threatening deterioration.  Critical care was time spent personally by me on the following activities: development of treatment plan with patient and/or surrogate as well as nursing, discussions with consultants, evaluation of patient's response to treatment, examination of patient, obtaining history from patient or surrogate, ordering and performing treatments and interventions, ordering and review of laboratory studies, ordering and review of radiographic studies, pulse oximetry and re-evaluation of patient's condition.   Layla Maw Ward, DO 08/28/13 [phone]

## 2013-08-27 NOTE — ED Notes (Signed)
Per EMS: Pt reports she started feeling weak, then started to develop CP. Pressure resolved shortly after EMS's arrival. Mild associated SOB, known history of COPD. CBG-166. NAD, Ax4. Pt last had dialysis on Friday. 22 R hand.

## 2013-08-27 NOTE — ED Notes (Addendum)
Pt had an episode of chest pain, during an episode of asystole last around 5 seconds.

## 2013-08-27 NOTE — ED Notes (Signed)
Pt experienced another episode of CP with an asystolic event lasting approximately 10 seconds. PA and EDP  At the bedside

## 2013-08-28 ENCOUNTER — Encounter (HOSPITAL_COMMUNITY): Payer: Self-pay | Admitting: Physician Assistant

## 2013-08-28 DIAGNOSIS — I4891 Unspecified atrial fibrillation: Secondary | ICD-10-CM

## 2013-08-28 DIAGNOSIS — R55 Syncope and collapse: Secondary | ICD-10-CM | POA: Diagnosis present

## 2013-08-28 DIAGNOSIS — J449 Chronic obstructive pulmonary disease, unspecified: Secondary | ICD-10-CM

## 2013-08-28 DIAGNOSIS — R001 Bradycardia, unspecified: Secondary | ICD-10-CM | POA: Diagnosis present

## 2013-08-28 DIAGNOSIS — I10 Essential (primary) hypertension: Secondary | ICD-10-CM

## 2013-08-28 DIAGNOSIS — I498 Other specified cardiac arrhythmias: Secondary | ICD-10-CM

## 2013-08-28 DIAGNOSIS — E119 Type 2 diabetes mellitus without complications: Secondary | ICD-10-CM

## 2013-08-28 DIAGNOSIS — N189 Chronic kidney disease, unspecified: Secondary | ICD-10-CM

## 2013-08-28 DIAGNOSIS — D631 Anemia in chronic kidney disease: Secondary | ICD-10-CM | POA: Diagnosis present

## 2013-08-28 DIAGNOSIS — J810 Acute pulmonary edema: Secondary | ICD-10-CM

## 2013-08-28 DIAGNOSIS — N186 End stage renal disease: Secondary | ICD-10-CM

## 2013-08-28 DIAGNOSIS — I5033 Acute on chronic diastolic (congestive) heart failure: Principal | ICD-10-CM

## 2013-08-28 DIAGNOSIS — R079 Chest pain, unspecified: Secondary | ICD-10-CM

## 2013-08-28 DIAGNOSIS — I509 Heart failure, unspecified: Secondary | ICD-10-CM

## 2013-08-28 LAB — CBC
HCT: 28.3 % — ABNORMAL LOW (ref 36.0–46.0)
Hemoglobin: 8.5 g/dL — ABNORMAL LOW (ref 12.0–15.0)
MCH: 32.4 pg (ref 26.0–34.0)
MCHC: 30 g/dL (ref 30.0–36.0)
MCV: 108 fL — ABNORMAL HIGH (ref 78.0–100.0)
Platelets: 216 10*3/uL (ref 150–400)
RDW: 16 % — ABNORMAL HIGH (ref 11.5–15.5)

## 2013-08-28 LAB — BASIC METABOLIC PANEL
BUN: 44 mg/dL — ABNORMAL HIGH (ref 6–23)
Chloride: 92 mEq/L — ABNORMAL LOW (ref 96–112)
Creatinine, Ser: 5.95 mg/dL — ABNORMAL HIGH (ref 0.50–1.10)
Glucose, Bld: 190 mg/dL — ABNORMAL HIGH (ref 70–99)
Potassium: 4.5 mEq/L (ref 3.5–5.1)
Sodium: 136 mEq/L (ref 135–145)

## 2013-08-28 LAB — TROPONIN I
Troponin I: <0.3 ng/mL
Troponin I: <0.3 ng/mL (ref <0.30)

## 2013-08-28 LAB — GLUCOSE, CAPILLARY
Glucose-Capillary: 186 mg/dL — ABNORMAL HIGH (ref 70–99)
Glucose-Capillary: 130 mg/dL — ABNORMAL HIGH (ref 70–99)
Glucose-Capillary: 135 mg/dL — ABNORMAL HIGH (ref 70–99)

## 2013-08-28 LAB — MRSA PCR SCREENING: MRSA by PCR: POSITIVE — AB

## 2013-08-28 LAB — TSH: TSH: 0.851 u[IU]/mL (ref 0.350–4.500)

## 2013-08-28 MED ORDER — INSULIN ASPART 100 UNIT/ML ~~LOC~~ SOLN: 0.0000 [IU] | Freq: Every day | SUBCUTANEOUS | Status: DC

## 2013-08-28 MED ORDER — ONDANSETRON HCL 4 MG/2ML IJ SOLN: 4.0000 mg | Freq: Four times a day (QID) | INTRAMUSCULAR | Status: DC | PRN

## 2013-08-28 MED ORDER — ROPINIROLE HCL 1 MG PO TABS
1.0000 mg | ORAL_TABLET | Freq: Two times a day (BID) | ORAL | Status: DC
  Administered 2013-08-28 – 2013-08-31 (×8): 1 mg via ORAL
  Filled 2013-08-28 (×9): qty 1

## 2013-08-28 MED ORDER — ALUM & MAG HYDROXIDE-SIMETH 200-200-20 MG/5ML PO SUSP: 30.0000 mL | Freq: Four times a day (QID) | ORAL | Status: DC | PRN

## 2013-08-28 MED ORDER — CALCIUM ACETATE 667 MG PO CAPS
667.0000 mg | ORAL_CAPSULE | Freq: Two times a day (BID) | ORAL | Status: DC | PRN
  Filled 2013-08-28: qty 1

## 2013-08-28 MED ORDER — WARFARIN - PHARMACIST DOSING INPATIENT: Freq: Every day | Status: DC

## 2013-08-28 MED ORDER — ALBUTEROL SULFATE (5 MG/ML) 0.5% IN NEBU
2.5000 mg | INHALATION_SOLUTION | Freq: Four times a day (QID) | RESPIRATORY_TRACT | Status: DC | PRN
  Administered 2013-08-29: 2.5 mg via RESPIRATORY_TRACT
  Filled 2013-08-28: qty 0.5

## 2013-08-28 MED ORDER — OXYCODONE HCL 5 MG PO TABS: 5.0000 mg | ORAL_TABLET | ORAL | Status: DC | PRN

## 2013-08-28 MED ORDER — BIOTENE DRY MOUTH MT LIQD
15.0000 mL | Freq: Two times a day (BID) | OROMUCOSAL | Status: DC
  Administered 2013-08-28 – 2013-08-31 (×7): 15 mL via OROMUCOSAL

## 2013-08-28 MED ORDER — FLUTICASONE PROPIONATE HFA 110 MCG/ACT IN AERO: 2.0000 | INHALATION_SPRAY | Freq: Three times a day (TID) | RESPIRATORY_TRACT | Status: DC | PRN

## 2013-08-28 MED ORDER — AMLODIPINE BESYLATE 5 MG PO TABS
5.0000 mg | ORAL_TABLET | Freq: Every day | ORAL | Status: DC
  Administered 2013-08-28 – 2013-08-31 (×4): 5 mg via ORAL
  Filled 2013-08-28 (×4): qty 1

## 2013-08-28 MED ORDER — SODIUM CHLORIDE 0.9 % IJ SOLN: 3.0000 mL | INTRAMUSCULAR | Status: DC | PRN

## 2013-08-28 MED ORDER — SODIUM CHLORIDE 0.9 % IV SOLN: 250.0000 mL | INTRAVENOUS | Status: DC | PRN

## 2013-08-28 MED ORDER — ATROPINE SULFATE 0.1 MG/ML IJ SOLN
1.0000 mg | INTRAMUSCULAR | Status: DC | PRN
  Filled 2013-08-28 (×2): qty 10

## 2013-08-28 MED ORDER — RENA-VITE PO TABS
1.0000 | ORAL_TABLET | Freq: Every day | ORAL | Status: DC
  Administered 2013-08-28 – 2013-08-30 (×4): 1 via ORAL
  Filled 2013-08-28 (×5): qty 1

## 2013-08-28 MED ORDER — ZOLPIDEM TARTRATE 5 MG PO TABS: 5.0000 mg | ORAL_TABLET | Freq: Every evening | ORAL | Status: DC | PRN

## 2013-08-28 MED ORDER — MUPIROCIN 2 % EX OINT
1.0000 | TOPICAL_OINTMENT | Freq: Two times a day (BID) | CUTANEOUS | Status: DC
Start: 2013-08-28 — End: 2013-08-31
  Administered 2013-08-28 – 2013-08-31 (×7): 1 via NASAL
  Filled 2013-08-28: qty 22

## 2013-08-28 MED ORDER — GLIPIZIDE 5 MG PO TABS
5.0000 mg | ORAL_TABLET | Freq: Every day | ORAL | Status: DC | PRN
  Filled 2013-08-28: qty 1

## 2013-08-28 MED ORDER — CHLORHEXIDINE GLUCONATE CLOTH 2 % EX PADS
6.0000 | MEDICATED_PAD | Freq: Every day | CUTANEOUS | Status: DC
  Administered 2013-08-28 – 2013-08-31 (×4): 6 via TOPICAL

## 2013-08-28 MED ORDER — CALCIUM ACETATE 667 MG PO CAPS
1334.0000 mg | ORAL_CAPSULE | Freq: Three times a day (TID) | ORAL | Status: DC
  Administered 2013-08-28 – 2013-08-31 (×8): 1334 mg via ORAL
  Filled 2013-08-28 (×13): qty 2

## 2013-08-28 MED ORDER — ACETAMINOPHEN 325 MG PO TABS: 650.0000 mg | ORAL_TABLET | Freq: Four times a day (QID) | ORAL | Status: DC | PRN

## 2013-08-28 MED ORDER — ACETAMINOPHEN 650 MG RE SUPP: 650.0000 mg | Freq: Four times a day (QID) | RECTAL | Status: DC | PRN

## 2013-08-28 MED ORDER — INSULIN ASPART 100 UNIT/ML ~~LOC~~ SOLN
0.0000 [IU] | Freq: Three times a day (TID) | SUBCUTANEOUS | Status: DC
  Administered 2013-08-28: 2 [IU] via SUBCUTANEOUS
  Administered 2013-08-28 (×2): 1 [IU] via SUBCUTANEOUS

## 2013-08-28 MED ORDER — EZETIMIBE 10 MG PO TABS
10.0000 mg | ORAL_TABLET | Freq: Every day | ORAL | Status: DC
  Administered 2013-08-28 – 2013-08-30 (×4): 10 mg via ORAL
  Filled 2013-08-28 (×5): qty 1

## 2013-08-28 MED ORDER — IPRATROPIUM BROMIDE 0.02 % IN SOLN: 500.0000 ug | Freq: Four times a day (QID) | RESPIRATORY_TRACT | Status: DC | PRN

## 2013-08-28 MED ORDER — WARFARIN SODIUM 5 MG PO TABS
5.0000 mg | ORAL_TABLET | Freq: Every day | ORAL | Status: DC
  Filled 2013-08-28: qty 1

## 2013-08-28 MED ORDER — SODIUM CHLORIDE 0.9 % IJ SOLN
3.0000 mL | Freq: Two times a day (BID) | INTRAMUSCULAR | Status: DC
  Administered 2013-08-28 (×3): 3 mL via INTRAVENOUS
  Administered 2013-08-29: 11:00:00 via INTRAVENOUS

## 2013-08-28 MED ORDER — HYDROMORPHONE HCL PF 1 MG/ML IJ SOLN
0.5000 mg | INTRAMUSCULAR | Status: DC | PRN
  Administered 2013-08-29: 1 mg via INTRAVENOUS

## 2013-08-28 MED ORDER — WARFARIN - PHYSICIAN DOSING INPATIENT: Freq: Every day | Status: DC

## 2013-08-28 MED ORDER — ONDANSETRON HCL 4 MG PO TABS: 4.0000 mg | ORAL_TABLET | Freq: Four times a day (QID) | ORAL | Status: DC | PRN

## 2013-08-28 NOTE — H&P (Signed)
[facility] History and Physical  [name]:[id number] DOB: 1940-09-30 DOA: 08/27/2013  Referring physician:  EDP PCP: [name], MD  Specialists:   Chief Complaint: Chest Pain  HPI: [name] is a 72 y.o. female  with multiple medical problems including ESRD on Dialysis and Atrial fibrillation who presents to the ED with complaints of chest pain  And weakness and feeling as if she would pass out.   She reports having increased DOE.  She was evaluated in the ED and was found to have a junctional rhythm with pauses for close to 10 seconds.  Cardiology was consulted by the EDP and she was referred for medical admission.      Review of Systems: The patient denies anorexia, fever, chills, headaches, weight loss,, vision loss, diplopia, dizziness, decreased hearing, rhinitis, hoarseness, peripheral edema, balance deficits, cough, hemoptysis, abdominal pain, nausea, vomiting, diarrhea, constipation, hematemesis, melena, hematochezia, severe indigestion/heartburn, dysuria, hematuria, incontinence, muscle weakness, suspicious skin lesions, transient blindness, difficulty walking, depression, unusual weight change, abnormal bleeding, enlarged lymph nodes, angioedema, and breast masses.    Past Medical History  Diagnosis Date  . Carotid artery occlusion     bilat CEA in 2012?  . C. 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 Hypertension   . Atrial fibrillation April  2013  . Pulmonary hypertension   . Secondary hyperparathyroidism, renal   . COPD (chronic obstructive pulmonary disease)   . Hypercholesterolemia   . History of MI (myocardial infarction)     "they say I had 2 years ago" (08/20/2012)  . GERD (gastroesophageal reflux disease)   . History of pneumonia     "have it alot" (08/20/2012)  . Sleep apnea     "don't wear mask anymore"  . Type II diabetes mellitus   . History of blood transfusion 2013    "3 times so far in 2013:  4 pints one time; 2  pints another; ?# last time" (08/20/2012)  . Iron deficiency anemia   . Seizures 2012    after carotid surgery  . ESRD on dialysis     "[place]; Tues, Thurs; Sat"  . Renal cell carcinoma 2005?  Marland Kitchen On home oxygen therapy     "24/7"   . Acute diverticulitis     Oct 2013, treated medically [facility], sigmoid diverticulitits  . CHF (congestive heart failure)     Past Surgical History  Procedure Laterality Date  . Nephrectomy  2005    Right, for Renal cell carcinoma  . Carotid endarterectomy  2012    bilaterally  . Av fistula placement  Jan. 7, 2011    Right  upper arm by Dr. Hart [place]  . Hd catheter  Aug. 22, 2013    [facility]  . Appendectomy      childhood  . Abdominal hysterectomy  1971?  Marland Kitchen Coronary angioplasty with stent placement  1990's    "1 total"  . Coronary angioplasty  1990's  . Av fistula repair  2013    right upper arm  . Esophagogastroduodenoscopy N/A 12/29/2012    Procedure: ESOPHAGOGASTRODUODENOSCOPY (EGD);  Surgeon: Petra Kuba, MD;  Location[facility] ENDOSCOPY;  Service: Endoscopy;  Laterality: N/A;  . Colonoscopy with propofol N/A 12/31/2012    Procedure: COLONOSCOPY WITH PROPOFOL;  Surgeon: Petra Kuba, MD;  Location: [facility] ENDOSCOPY;  Service: Endoscopy;  Laterality: N/A;  currently IP at [facility] [id number]    Prior to Admission medications   Medication Sig Start Date End  Date Taking? Authorizing Provider  albuterol (PROVENTIL) (2.5 MG/3ML) 0.083% nebulizer solution Take 2.5 mg by nebulization 2 (two) times daily.   Yes Historical Provider, MD  amiodarone (PACERONE) 400 MG tablet Take 1 tablet by mouth daily. 08/09/13  Yes Historical Provider, MD  calcium acetate (PHOSLO) 667 MG capsule Take 667-1,334 mg by mouth 3 (three) times daily with meals. Takes 2 capsules with meals three times daily Takes 1 capsule with snacks twice daily   Yes Historical Provider, MD  carvedilol (COREG) 12.5 MG tablet Take 1 tablet (12.5 mg total) by mouth 2 (two) times daily with a meal.  Hold this medication on the morning of dialysis but once dialysis treatment complete he may take later in the afternoon after dialysis treatment complete 06/24/13  Yes Russella Dar, NP  diltiazem (CARTIA XT) 240 MG 24 hr capsule Take 1 capsule (240 mg total) by mouth daily. Please hold this medication on the morning of dialysis. He may take this medication watch her dialysis session is complete. 06/24/13  Yes Russella Dar, NP  ezetimibe (ZETIA) 10 MG tablet Take 10 mg by mouth at bedtime.    Yes Historical Provider, MD  ipratropium (ATROVENT) 0.02 % nebulizer solution Take 500 mcg by nebulization every 6 (six) hours as needed for wheezing.   Yes Historical Provider, MD  multivitamin (RENA-VIT) TABS tablet Take 1 tablet by mouth daily.   Yes Historical Provider, MD  calcium carbonate (TUMS - DOSED IN MG ELEMENTAL CALCIUM) 500 MG chewable tablet Chew 2 tablets (400 mg of elemental calcium total) by mouth 3 (three) times daily as needed for heartburn. 06/24/13   Russella Dar, NP  fluticasone (FLOVENT HFA) 110 MCG/ACT inhaler Inhale 2 puffs into the lungs 3 (three) times daily as needed (for wheezing and shortness of breath).    Historical Provider, MD  glipiZIDE (GLUCOTROL) 5 MG tablet Take 5 mg by mouth daily as needed (if CBG is >150).    Historical Provider, MD  rOPINIRole (REQUIP) 1 MG tablet Take 1 mg by mouth 2 (two) times daily.    Historical Provider, MD  warfarin (COUMADIN) 5 MG tablet Take one tab daily at supper or as adjusted by [facility] 06/24/13   Russella Dar, NP    Allergies  Allergen Reactions  . Morphine And Related Nausea And Vomiting  . Lipitor [Atorvastatin] Other (See Comments)    Causes drowsiness  . Nsaids     Unknown reported by previous hospital    Social History:  reports that she quit smoking about 9 years ago. Her smoking use included Cigarettes. She has a 67.5 pack-year smoking history. She has never used smokeless tobacco. She reports that she does not  drink alcohol or use illicit drugs.     Family History  Problem Relation Age of Onset  . Cancer Mother     pancreatic  . Stroke Father   . Coronary artery disease Father   . Cancer Brother     Brain  . Kidney disease Brother     Kidney stones    (be sure to complete)   Physical Exam:  GEN:  Pleasant Obese Elderly 72 y.o. Caucasian female  examined  and in no acute distress; cooperative with exam Filed Vitals:   08/28/13 0300 08/28/13 0400 08/28/13 0500 08/28/13 0600  BP: 125/43 105/39 116/38 115/39  Pulse: 62 57 56 58  Temp:  98.2 F (36.8 C)    TempSrc:  Oral    Resp: 18 20 17  15  Height:      Weight:      SpO2: 92% 94% 96% 95%   Blood pressure 115/39, pulse 58, temperature 98.2 F (36.8 C), temperature source Oral, resp. rate 15, height 5\' 3"  (1.6 m), weight 79.1 kg (174 lb 6.1 oz), SpO2 95.00%. PSYCH: She is alert and oriented x4; does not appear anxious does not appear depressed; affect is normal HEENT: Normocephalic and Atraumatic, Mucous membranes pink; PERRLA; EOM intact; Fundi:  Benign;  No scleral icterus, Nares: Patent, Oropharynx: Clear, Edentulous, Neck:  FROM, no cervical lymphadenopathy nor thyromegaly or carotid bruit; no JVD; Breasts:: Not examined CHEST WALL: No tenderness CHEST: Normal respiration, clear to auscultation bilaterally HEART: Regular rate and rhythm; no murmurs rubs or gallops BACK: No kyphosis or scoliosis; no CVA tenderness ABDOMEN: Positive Bowel Sounds, Obese, soft non-tender; no masses, no organomegaly, no pannus; no intertriginous candida. Rectal Exam: Not done EXTREMITIES: No  cyanosis, clubbing or edema; no ulcerations. Genitalia: not examined PULSES: 2+ and symmetric SKIN: Normal hydration no rash or ulceration CNS: Cranial nerves 2-12 grossly intact no focal neurologic deficit    Labs on Admission:  Basic Metabolic Panel:  Recent Labs Lab 08/27/13 2219 08/27/13 2250 08/28/13 0515  NA 139  --  136  K 4.2  --  4.5  CL  95*  --  92*  CO2 33*  --  24  GLUCOSE 115*  --  190*  BUN 41*  --  44*  CREATININE 5.47*  --  5.95*  CALCIUM 9.1  --  9.0  MG  --  2.3  --    Liver Function Tests: No results found for this basename: AST, ALT, ALKPHOS, BILITOT, PROT, ALBUMIN,  in the last 168 hours No results found for this basename: LIPASE, AMYLASE,  in the last 168 hours No results found for this basename: AMMONIA,  in the last 168 hours CBC:  Recent Labs Lab 08/27/13 2219 08/28/13 0515  WBC 15.6* 13.3*  HGB 9.8* 8.5*  HCT 33.3* 28.3*  MCV 109.9* 108.0*  PLT 245 216   Cardiac Enzymes:  Recent Labs Lab 08/28/13 0001  TROPONINI <0.30    BNP (last 3 results)  Recent Labs  10/19/12 1605 04/13/13 1644 08/27/13 2220  PROBNP 17356.0* 12692.0* 14565.0*   CBG: No results found for this basename: GLUCAP,  in the last 168 hours  Radiological Exams on Admission: Dg Chest Portable 1 View  08/28/2013   CLINICAL DATA:  Chest pain.  EXAM: PORTABLE CHEST - 1 VIEW  COMPARISON:  06/24/2013 and prior chest radiographs  FINDINGS: Cardiomegaly and mild pulmonary vascular congestion noted.  A defibrillator pad overlying the chest is present.  Mild left basilar atelectasis noted.  There is no evidence of focal airspace disease, pulmonary edema, suspicious pulmonary nodule/mass, pleural effusion, or pneumothorax. No acute bony abnormalities are identified.  IMPRESSION: Cardiomegaly with mild pulmonary vascular congestion.   Electronically Signed   By: Laveda Abbe M.D.   On: 08/28/2013 00:00     EKG: Independently reviewed.  Junction rhythm rate 49 No acute ST changes.      Assessment/Plan Principal Problem:   Junctional bradycardia Active Problems:   Chest pain   Pulmonary edema   Atrial fibrillation   Pre-syncope   CKD (chronic kidney disease) stage V requiring chronic dialysis   DM2 (diabetes mellitus, type 2)   COPD (chronic obstructive pulmonary disease)   HTN (hypertension)   Anemia in chronic kidney  disease     1.  Junctional bradycardia-  Admitted to Stepdown Bed continue to monitor rhythm,  PRN IV Atropine for HR < 45.  Diltiazem, Amiodarone, and Carvedilol on hold.    2.   Pre-syncope - Most likely due to #1.    3.   Chest Pain-  Cycle Troponins.    4.   Atrial fibrillation-   Monitor Rhythm, continue coumadin Rx .  Note that Amiodarone and Carvedilol and diltiazem on hold.    5.   Pulm Edema-  Notify Dialysis Team.     6.   DM2- Diet controlled per patient,  Check HbA1C and Add SSI coverage.     7.   COPD- stable  8.   HTN-  On Diltiazem, and Carvedilol Rx but on hold monitor BPs.    9.   CKD Stage V-  Notify dialysis Team to continue dialysis Rx (her Schedule is MWF).     9.  Coagulopathy due to Coumadin Rx-  moniotr PT/InRs and Pharmacy to adjust PRN.           Code Status:   FULL CODE Family Communication:    Family at bedside Disposition Plan:       Inpatient  Time spent:  68 Minutes  Ron Parker [facility] Pager [phone]  If 7PM-7AM, please contact night-coverage [email or web address] Password [facility] 08/28/2013, 7:41 AM

## 2013-08-28 NOTE — Progress Notes (Signed)
[facility] PROGRESS NOTE  Assessment/Plan: *Junctional bradycardia/ Pre-syncope/  Chest pain - Off rate controlling medication symptoms have resolved. - Now back in SR. - Atropine at bedside, awaiting cards consult.   Anemia in chronic kidney disease/ CKD (chronic kidney disease) stage V requiring chronic dialysis - Per renal  Atrial fibrillation - Now in sinus rhythm. - cont coumadin.  HTN (hypertension) - stable cont to monitor.   DM2 (diabetes mellitus, type 2): - increasing due to solumedrol, now d/c. - cont SSI and glipizide.  Pulmonary edema - resolved no JVD, Sat > 90 on RA.  Code Status: FULL CODE  Family Communication: Family at bedside  Disposition Plan: Inpatient    Consultants:  cardiology  Procedures:  cxr  Antibiotics:  None  HPI/Subjective: Feel much better lightheadedness and SOB improved.  Objective: Filed Vitals:   08/28/13 0300 08/28/13 0400 08/28/13 0500 08/28/13 0600  BP: 125/43 105/39 116/38 115/39  Pulse: 62 57 56 58  Temp:  98.2 F (36.8 C)    TempSrc:  Oral    Resp: 18 20 17 15   Height:      Weight:      SpO2: 92% 94% 96% 95%    Intake/Output Summary (Last 24 hours) at 08/28/13 0454 Last data filed at 08/28/13 0100  Gross per 24 hour  Intake    240 ml  Output      0 ml  Net    240 ml   Filed Weights   08/28/13 0000  Weight: 79.1 kg (174 lb 6.1 oz)    Exam:  General: Alert, awake, oriented x3, in no acute distress.  HEENT: No bruits, no goiter. No JVD Heart: Regular rate and rhythm, without murmurs, rubs, gallops.  Lungs: Good air movement, bilateral air movement.  Abdomen: Soft, nontender, nondistended, positive bowel sounds.     Data Reviewed: Basic Metabolic Panel:  Recent Labs Lab 08/27/13 2219 08/27/13 2250 08/28/13 0515  NA 139  --  136  K 4.2  --  4.5  CL 95*  --  92*  CO2 33*  --  24  GLUCOSE 115*  --  190*  BUN 41*  --  44*  CREATININE 5.47*  --  5.95*  CALCIUM 9.1  --  9.0  MG   --  2.3  --    Liver Function Tests: No results found for this basename: AST, ALT, ALKPHOS, BILITOT, PROT, ALBUMIN,  in the last 168 hours No results found for this basename: LIPASE, AMYLASE,  in the last 168 hours No results found for this basename: AMMONIA,  in the last 168 hours CBC:  Recent Labs Lab 08/27/13 2219 08/28/13 0515  WBC 15.6* 13.3*  HGB 9.8* 8.5*  HCT 33.3* 28.3*  MCV 109.9* 108.0*  PLT 245 216   Cardiac Enzymes:  Recent Labs Lab 08/28/13 0001  TROPONINI <0.30   BNP (last 3 results)  Recent Labs  10/19/12 1605 04/13/13 1644 08/27/13 2220  PROBNP 17356.0* 12692.0* 14565.0*   CBG: No results found for this basename: GLUCAP,  in the last 168 hours  Recent Results (from the past 240 hour(s))  MRSA PCR SCREENING     Status: Abnormal   Collection Time    08/28/13  2:01 AM      Result Value Range Status   MRSA by PCR POSITIVE (*) NEGATIVE Final   Comment:            The GeneXpert MRSA Assay (FDA     approved for NASAL specimens  only), is one component of a     comprehensive MRSA colonization     surveillance program. It is not     intended to diagnose MRSA     infection nor to guide or     monitor treatment for     MRSA infections.     RESULT CALLED TO, READ BACK BY AND VERIFIED WITH:     CALLED TO RN [facility] WHITE [id number] @[id number]  THANEY     Studies: Dg Chest Portable 1 View  08/28/2013   CLINICAL DATA:  Chest pain.  EXAM: PORTABLE CHEST - 1 VIEW  COMPARISON:  06/24/2013 and prior chest radiographs  FINDINGS: Cardiomegaly and mild pulmonary vascular congestion noted.  A defibrillator pad overlying the chest is present.  Mild left basilar atelectasis noted.  There is no evidence of focal airspace disease, pulmonary edema, suspicious pulmonary nodule/mass, pleural effusion, or pneumothorax. No acute bony abnormalities are identified.  IMPRESSION: Cardiomegaly with mild pulmonary vascular congestion.   Electronically Signed   By: Laveda Abbe M.D.   On:  08/28/2013 00:00    Scheduled Meds: . atropine  1 mg Intravenous Once  . calcium acetate  1,334 mg Oral TID WC  . ezetimibe  10 mg Oral QHS  . insulin aspart  0-5 Units Subcutaneous QHS  . insulin aspart  0-9 Units Subcutaneous TID WC  . multivitamin  1 tablet Oral QHS  . rOPINIRole  1 mg Oral BID  . sodium chloride  3 mL Intravenous Q12H  . Warfarin - Pharmacist Dosing Inpatient   Does not apply q1800   Continuous Infusions:    Marinda Elk  [facility] Pager [phone]. If 8PM-8AM, please contact night-coverage at [email or web address], password [facility] 08/28/2013, 8:12 AM  LOS: 1 day

## 2013-08-28 NOTE — Progress Notes (Signed)
Utilization Review Completed.[name] T11/30/2014  

## 2013-08-28 NOTE — Consult Note (Signed)
CARDIOLOGY CONSULT NOTE   Patient ID: [name] MRN: [id number] DOB/AGE: September 09, 1941 72 y.o.  Admit date: 08/27/2013  Primary Physician   [name], MD Primary Cardiologist   Dr. Dulce Sellar Reason for Consultation   Chest pain  [name] is a 72 y.o. female with a history of CAD and PAF. She was previously on coumadin but had a GIB in April 2014, so was not on any anticoagulation PTA. She has a history of PAF, unclear how often or how much time she spends in atrial fib.   She was in her usual state of health yesterday am then had sudden onset of sub-xyphoid chest pain associated with feeling her heart "stop" and presyncope.  She came to the ER by EMS and pauses up to 10 seconds were seen. She had external pacing briefly and was admitted. PTA she was on Cardizem, amiodarone and carvedilol. These have all been held. Her initial rhythm was junctional bradycardia, but she is currently in SR. Once in SR, with a normal heart rate, all symptoms resolved.  Past Medical History  Diagnosis Date  . Carotid artery occlusion     bilat CEA in 2012?  . C. 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 Hypertension   . Atrial fibrillation April  2013  . Pulmonary hypertension   . Secondary hyperparathyroidism, renal   . COPD (chronic obstructive pulmonary disease)   . Hypercholesterolemia   . History of MI (myocardial infarction)     "they say I had 2 years ago" (08/20/2012)  . GERD (gastroesophageal reflux disease)   . History of pneumonia     "have it alot" (08/20/2012)  . Sleep apnea     "don't wear mask anymore"  . Type II diabetes mellitus   . History of blood transfusion 2013    "3 times so far in 2013:  4 pints one time; 2 pints another; ?# last time" (08/20/2012)  . Iron deficiency anemia   . Seizures 2012    after carotid surgery  . ESRD on dialysis     "[place]; Tues, Thurs; Sat"  . Renal cell carcinoma 2005?  Marland Kitchen On home oxygen therapy     "24/7"     . Acute diverticulitis     Oct 2013, treated medically [place], sigmoid diverticulitits  . CHF (congestive heart failure)      Past Surgical History  Procedure Laterality Date  . Nephrectomy  2005    Right, for Renal cell carcinoma  . Carotid endarterectomy  2012    bilaterally  . Av fistula placement  Jan. 7, 2011    Right  upper arm by Dr. Hart [place]  . Hd catheter  Aug. 22, 2013    [facility]  . Appendectomy      childhood  . Abdominal hysterectomy  1971?  Marland Kitchen Coronary angioplasty with stent placement  1990's    "1 total"  . Coronary angioplasty  1990's  . Av fistula repair  2013    right upper arm  . Esophagogastroduodenoscopy N/A 12/29/2012    Procedure: ESOPHAGOGASTRODUODENOSCOPY (EGD);  Surgeon: Petra Kuba, MD;  Location: [facility] ENDOSCOPY;  Service: Endoscopy;  Laterality: N/A;  . Colonoscopy with propofol N/A 12/31/2012    Procedure: COLONOSCOPY WITH PROPOFOL;  Surgeon: Petra Kuba, MD;  Location: [facility] ENDOSCOPY;  Service: Endoscopy;  Laterality: N/A;  currently IP at [facility] 3307    Allergies  Allergen Reactions  . Morphine And Related  Nausea And Vomiting  . Lipitor [Atorvastatin] Other (See Comments)    Causes weakness and drowsiness  . Nsaids     Unknown reported by previous hospital    I have reviewed the patient's current medications . antiseptic oral rinse  15 mL Mouth Rinse BID  . atropine  1 mg Intravenous Once  . calcium acetate  1,334 mg Oral TID WC  . Chlorhexidine Gluconate Cloth  6 each Topical Q0600  . ezetimibe  10 mg Oral QHS  . insulin aspart  0-5 Units Subcutaneous QHS  . insulin aspart  0-9 Units Subcutaneous TID WC  . multivitamin  1 tablet Oral QHS  . mupirocin ointment  1 application Nasal BID  . rOPINIRole  1 mg Oral BID  . sodium chloride  3 mL Intravenous Q12H  . Warfarin - Pharmacist Dosing Inpatient   Does not apply q1800     sodium chloride, acetaminophen, acetaminophen, albuterol, alum & mag hydroxide-simeth, atropine,  atropine, calcium acetate, fluticasone, glipiZIDE, HYDROmorphone (DILAUDID) injection, ipratropium, ondansetron (ZOFRAN) IV, ondansetron, oxyCODONE, sodium chloride, zolpidem  Medication Sig  albuterol (PROVENTIL HFA;VENTOLIN HFA) 108 (90 BASE) MCG/ACT inhaler Inhale 1-2 puffs into the lungs 2 (two) times daily as needed for wheezing or shortness of breath.   amiodarone (PACERONE) 400 MG tablet Take 400 mg by mouth daily.   calcium acetate (PHOSLO) 667 MG capsule Take 2,001 mg by mouth 3 (three) times daily with meals.  carvedilol (COREG) 25 MG tablet Take 6.25-25 mg by mouth daily with lunch. On dialysis days Mon.,Wed. Friday takes 25 mg (1 tablet) after dialysis and takes 6.25 mg (1/4th tablet) on non-dialysis days Tues., Thurs. Sat. And Sunday.  diltiazem (CARTIA XT) 240 MG 24 hr capsule Take 1 capsule (240 mg total) by mouth daily. Please hold this medication on the morning of dialysis. He may take this medication watch her dialysis session is complete.  ezetimibe (ZETIA) 10 MG tablet Take 10 mg by mouth at bedtime.   glipiZIDE (GLUCOTROL) 5 MG tablet Take 5 mg by mouth daily as needed (if CBG is >150).  multivitamin (RENA-VIT) TABS tablet Take 1 tablet by mouth daily.  rOPINIRole (REQUIP) 1 MG tablet Take 1 mg by mouth 2 (two) times daily.  fluticasone (FLOVENT HFA) 110 MCG/ACT inhaler Inhale 2 puffs into the lungs 3 (three) times daily as needed (for wheezing and shortness of breath).     History   Social History  . Marital Status: Widowed    Spouse Name: N/A    Number of Children: N/A  . Years of Education: N/A   Occupational History  . Retired    Social History Main Topics  . Smoking status: Former Smoker -- 1.50 packs/day for 45 years    Types: Cigarettes    Quit date: 09/30/2003  . Smokeless tobacco: Never Used  . Alcohol Use: No  . Drug Use: No  . Sexual Activity: No   Other Topics Concern  . Not on file   Social History Narrative  . No narrative on file    Family  Status  Relation Status Death Age  . Mother Deceased 101    Pancreatic cancer  . Father Deceased 26    coronary artery disease  . Brother Deceased     Brain cancer   Family History  Problem Relation Age of Onset  . Cancer Mother     pancreatic  . Stroke Father   . Coronary artery disease Father   . Cancer Brother  Brain  . Kidney disease Brother     Kidney stones     ROS:  Full 14 point review of systems complete and found to be negative unless listed above.  Physical Exam: Blood pressure 109/58, pulse 66, temperature 98.6 F (37 C), temperature source Oral, resp. rate 20, height 5\' 3"  (1.6 m), weight 174 lb 6.1 oz (79.1 kg), SpO2 94.00%.  General: Well developed, well nourished, female in no acute distress Head: Eyes PERRLA, No xanthomas.   Normocephalic and atraumatic, oropharynx without edema or exudate. Dentition: poor Lungs: decreased BS bases Heart: HRRR S1 S2, no rub/gallop, 2/6 murmur. pulses are 2+ all 4 extrem.   Neck: No carotid bruits. No lymphadenopathy.  JVP 12 cm. Abdomen: Bowel sounds present, abdomen soft and non-tender without masses or hernias noted. Msk:  No spine or cva tenderness. No weakness, no joint deformities or effusions. Extremities: No clubbing or cyanosis.  No edema.  Neuro: Alert and oriented X 3. No focal deficits noted. Psych:  Good affect, responds appropriately Skin: No rashes or lesions noted.  Labs:   Lab Results  Component Value Date   WBC 13.3* 08/28/2013   HGB 8.5* 08/28/2013   HCT 28.3* 08/28/2013   MCV 108.0* 08/28/2013   PLT 216 08/28/2013    Recent Labs  08/27/13 2310  INR 0.93     Recent Labs Lab 08/28/13 0515  NA 136  K 4.5  CL 92*  CO2 24  BUN 44*  CREATININE 5.95*  CALCIUM 9.0  GLUCOSE 190*   Magnesium  Date Value Range Status  08/27/2013 2.3  1.5 - 2.5 mg/dL Final    Recent Labs  40/98/11 0001 08/28/13 0515  TROPONINI <0.30 <0.30    Recent Labs  08/27/13 2237  TROPIPOC 0.03   Pro B  Natriuretic peptide (BNP)  Date/Time Value Range Status  08/27/2013 10:20 PM 14565.0* 0 - 125 pg/mL Final  04/13/2013  4:44 PM 12692.0* 0 - 125 pg/mL Final   TSH  Date/Time Value Range Status  06/22/2013  5:45 PM 1.629  0.350 - 4.500 uIU/mL Final     Performed at [facility]   Echo: 10/19/2012 Study Conclusions - Left ventricle: There was severe concentric hypertrophy with increased apical thickness, 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 Systolic function was vigorous. The estimated ejection fraction was in the range of 65% to 70%. There is apical to mid-ventricle systolic cavity obliteration noted. Images were inadequate for LV wall motion assessment. LV diastolic function cannot be assessed due to EA fusion from tachycardia. - Aortic valve: Sclerosis without stenosis. No regurgitation. - Mitral valve: Moderately calcified annulus. Mild regurgitation. - Left atrium: The atrium was normal in size. - Atrial septum: No defect or patent foramen ovale was identified. - Pulmonary arteries: PA peak pressure: At least 37mm Hg (S). - Inferior vena cava: The vessel was dilated; the respirophasic diameter changes were blunted (< 50%); findings are consistent with elevated central venous pressure - however, this may in part be due to positive pressure ventilation.  ECG:  08/27/2013 Junctional Rhythm Vent. rate 49 BPM PR interval * ms QRS duration 75 ms QT/QTc 493/445 ms P-R-T axes -1 60 103  Radiology:  Dg Chest Portable 1 View 08/28/2013   CLINICAL DATA:  Chest pain.  EXAM: PORTABLE CHEST - 1 VIEW  COMPARISON:  06/24/2013 and prior chest radiographs  FINDINGS: Cardiomegaly and mild pulmonary vascular congestion noted.  A defibrillator pad overlying the chest is present.  Mild left basilar atelectasis noted.  There  is no evidence of focal airspace disease, pulmonary edema, suspicious pulmonary nodule/mass, pleural effusion, or pneumothorax. No acute bony  abnormalities are identified.  IMPRESSION: Cardiomegaly with mild pulmonary vascular congestion.   Electronically Signed   By: Laveda Abbe M.D.   On: 08/28/2013 00:00    ASSESSMENT AND PLAN:   The patient was seen today by Dr. Shirlee Latch, the patient evaluated and the data reviewed.  Principal Problem:   Junctional bradycardia Active Problems:   CKD (chronic kidney disease) stage V requiring chronic dialysis   DM2 (diabetes mellitus, type 2)   Pulmonary edema   COPD (chronic obstructive pulmonary disease)   HTN (hypertension)   Atrial fibrillation   Pre-syncope   Anemia in chronic kidney disease   Chest pain   Signed: Theodore Demark, PA-C 08/28/2013 1:08 PM Beeper [phone]  Co-Sign MD  Patient seen with PA, agree with the above note.  1. Bradycardia: Patient had episodes of presyncope starting yesterday (none before).  In ER at [facility], she was noted to have up to 10 second pauses x 2.  She was given atropine and by her description, it sounds like she was externally paced for a period of time.  She was transferred here and noted to be in a junctional bradycardia with rate around 50.  She apparently had another pause either in the ambulance or in the ER here. We have no rhythm strips documenting the pauses.  ECG initially shows junctional bradycardia with rate 49.  She was on Coreg, amiodarone, and diltiazem at home for history of PAF (amiodarone was started in mid-October).  She has been in NSR in the 60s since she has been on the step down unit.  - Hold Coreg, amiodarone, and diltiazem.  - Will follow rhythm.  With further junctional bradycardia or pauses, will need PCM.  If she remains in NSR off the nodal blocking agents, will need to obtain records from her primary cardiologist in [place].  If she has frequent afib with RVR, she will likely need pacing to allow the use of nodal blocking agents.   2. Atrial fibrillation: Paroxysmal.  She is currently in NSR off all nodal blocking agents.   She is not on coumadin due to history of GI bleeding.  See discussion above regarding possible need for pacing to allow nodal agents. Will try to get more details on her history from her primary cardiologist.  3. ESRD: MWF HD.  4. 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  Patient reports NYHA class IIIb symptoms.  She is volume overloaded on exam with elevated JVP.  She will get HD tomorrow.  5. HTN: BP running high.  Will use amlodipine for BP control since she is off Coreg and diltiazem.   Marca Ancona 08/28/2013 1:58 PM

## 2013-08-29 ENCOUNTER — Encounter (HOSPITAL_COMMUNITY): Payer: Self-pay | Admitting: *Deleted

## 2013-08-29 LAB — GLUCOSE, CAPILLARY: Glucose-Capillary: 118 mg/dL — ABNORMAL HIGH (ref 70–99)

## 2013-08-29 LAB — HEPATIC FUNCTION PANEL
Alkaline Phosphatase: 95 U/L (ref 39–117)
Bilirubin, Direct: <0.1 mg/dL (ref 0.0–0.3)
Total Bilirubin: 0.3 mg/dL (ref 0.3–1.2)

## 2013-08-29 MED ORDER — LIDOCAINE HCL (PF) 1 % IJ SOLN: 5.0000 mL | INTRAMUSCULAR | Status: DC | PRN

## 2013-08-29 MED ORDER — SODIUM CHLORIDE 0.9 % IV SOLN: 100.0000 mL | INTRAVENOUS | Status: DC | PRN

## 2013-08-29 MED ORDER — DARBEPOETIN ALFA-POLYSORBATE 60 MCG/0.3ML IJ SOLN
INTRAMUSCULAR | Status: AC
  Administered 2013-08-29: 60 ug via INTRAVENOUS
  Filled 2013-08-29: qty 0.3

## 2013-08-29 MED ORDER — HEPARIN SODIUM (PORCINE) 1000 UNIT/ML DIALYSIS: 1000.0000 [IU] | INTRAMUSCULAR | Status: DC | PRN

## 2013-08-29 MED ORDER — ENOXAPARIN SODIUM 30 MG/0.3ML ~~LOC~~ SOLN
30.0000 mg | SUBCUTANEOUS | Status: DC
  Administered 2013-08-29 – 2013-08-31 (×3): 30 mg via SUBCUTANEOUS
  Filled 2013-08-29 (×3): qty 0.3

## 2013-08-29 MED ORDER — DARBEPOETIN ALFA-POLYSORBATE 60 MCG/0.3ML IJ SOLN
60.0000 ug | INTRAMUSCULAR | Status: DC
  Administered 2013-08-29: 60 ug via INTRAVENOUS

## 2013-08-29 MED ORDER — ALTEPLASE 2 MG IJ SOLR
2.0000 mg | Freq: Once | INTRAMUSCULAR | Status: DC | PRN
  Filled 2013-08-29: qty 2

## 2013-08-29 MED ORDER — AMIODARONE HCL 200 MG PO TABS
200.0000 mg | ORAL_TABLET | Freq: Every day | ORAL | Status: DC
  Administered 2013-08-29 – 2013-08-31 (×3): 200 mg via ORAL
  Filled 2013-08-29 (×3): qty 1

## 2013-08-29 MED ORDER — HYDROMORPHONE HCL PF 1 MG/ML IJ SOLN
INTRAMUSCULAR | Status: AC
  Administered 2013-08-29: 1 mg via INTRAVENOUS
  Filled 2013-08-29: qty 1

## 2013-08-29 MED ORDER — PENTAFLUOROPROP-TETRAFLUOROETH EX AERO: 1.0000 "application " | INHALATION_SPRAY | CUTANEOUS | Status: DC | PRN

## 2013-08-29 MED ORDER — LIDOCAINE-PRILOCAINE 2.5-2.5 % EX CREA
1.0000 "application " | TOPICAL_CREAM | CUTANEOUS | Status: DC | PRN
  Filled 2013-08-29: qty 5

## 2013-08-29 MED ORDER — NEPRO/CARBSTEADY PO LIQD
237.0000 mL | ORAL | Status: DC | PRN
  Filled 2013-08-29: qty 237

## 2013-08-29 MED ORDER — NA FERRIC GLUC CPLX IN SUCROSE 12.5 MG/ML IV SOLN
125.0000 mg | INTRAVENOUS | Status: DC
  Administered 2013-08-29 – 2013-08-31 (×2): 125 mg via INTRAVENOUS
  Filled 2013-08-29 (×4): qty 10

## 2013-08-29 NOTE — Consult Note (Signed)
ELECTROPHYSIOLOGY CONSULT NOTE  Patient ID: [name] MRN: [id number], DOB/AGE: 05/14/1941   Admit date: 08/27/2013 Date of Consult: 08/29/2013  Primary Physician: [name], MD Primary Cardiologist: Dulce Sellar, MD Reason for Consultation: Bradycardia  History of Present Illness [name] is a 72 y.o. female with CAD, COPD, pulmonary hypertension, DM, ESRD on HD, GI bleed April 2014 now off anticoagulation, chronic anemia and PAF and possible diagnosis of Apical HCM who was admitted yesterday with chest pain, epigastric pain and near syncope. She is followed by Dr. Dulce Sellar and is currently taking amiodarone, Coreg and diltiazem. On admission, she was found to have junctional bradycardia with pauses up to 10 seconds in duration. K 4.2, Mg 2.3. Troponin negative. She was given atropine and briefly externally paced. She was then transferred to [facility]. Amiodarone, Coreg and diltiazem are being held. She is now in SR with rate in 70s.  [name] reports she has chronic DOE and chronic fatigue. She reports "sharp" epigastric pain now that worsens with deep inspiration. She denies palpitations or prior history of near syncope or syncope. She denies recent illness, fever or chills.    Past Medical History Past Medical History  Diagnosis Date  . Carotid artery occlusion     bilat CEA in 2012  . C. 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 Hypertension   . Atrial fibrillation April  2013  . Pulmonary hypertension   . Secondary hyperparathyroidism, renal   . COPD (chronic obstructive pulmonary disease)   . Hypercholesterolemia   . History of MI (myocardial infarction)     "they say I had 2 years ago" (08/20/2012)  . GERD (gastroesophageal reflux disease)   . History of pneumonia     "have it alot" (08/20/2012)  . Sleep apnea     "don't wear mask anymore"  . Type II diabetes mellitus   . History of blood transfusion 2013    "3 times so far in 2013:  4 pints one time; 2  pints another; ?# last time" (08/20/2012)  . Iron deficiency anemia   . Seizures 2012    after carotid surgery  . ESRD on dialysis     "[place]; Tues, Thurs; Sat"  . Renal cell carcinoma 2005?  Marland Kitchen On home oxygen therapy     "24/7"   . Acute diverticulitis     Oct 2013, treated medically [facility], sigmoid diverticulitits  . CHF (congestive heart failure)     Past Surgical History Past Surgical History  Procedure Laterality Date  . Nephrectomy  2005    Right, for Renal cell carcinoma  . Carotid endarterectomy  2012    bilaterally; Dr. Hart Rochester  . Av fistula placement  Jan. 7, 2011    Right  upper arm by Dr. Hart Rochester  . Hd catheter  Aug. 22, 2013    [facility]  . Appendectomy      childhood  . Abdominal hysterectomy  1971?  Marland Kitchen Coronary angioplasty with stent placement  1990's    "1 total"  . Coronary angioplasty  1990's  . Av fistula repair  2013    right upper arm  . Esophagogastroduodenoscopy N/A 12/29/2012    Procedure: ESOPHAGOGASTRODUODENOSCOPY (EGD);  Surgeon: Petra Kuba, MD;  Location: [facility] ENDOSCOPY;  Service: Endoscopy;  Laterality: N/A;  . Colonoscopy with propofol N/A 12/31/2012    Procedure: COLONOSCOPY WITH PROPOFOL;  Surgeon: Petra Kuba, MD;  Location: [facility] ENDOSCOPY;  Service: Endoscopy;  Laterality: N/A;  currently IP at [facility] 3307    Allergies/Intolerances Allergies  Allergen Reactions  . Morphine And Related Nausea And Vomiting  . Lipitor [Atorvastatin] Other (See Comments)    Causes weakness and drowsiness  . Nsaids     Unknown reported by previous hospital    Inpatient Medications . amLODipine  5 mg Oral Daily  . antiseptic oral rinse  15 mL Mouth Rinse BID  . atropine  1 mg Intravenous Once  . calcium acetate  1,334 mg Oral TID WC  . Chlorhexidine Gluconate Cloth  6 each Topical Q0600  . ezetimibe  10 mg Oral QHS  . insulin aspart  0-5 Units Subcutaneous QHS  . insulin aspart  0-9 Units Subcutaneous TID WC  . multivitamin  1 tablet Oral  QHS  . mupirocin ointment  1 application Nasal BID  . rOPINIRole  1 mg Oral BID  . sodium chloride  3 mL Intravenous Q12H    Family History Family History  Problem Relation Age of Onset  . Cancer Mother     pancreatic  . Stroke Father   . Coronary artery disease Father   . Cancer Brother     Brain  . Kidney disease Brother     Kidney stones     Social History Social History  . Marital Status: Widowed   Social History Main Topics  . Smoking status: Former Smoker -- 1.50 packs/day for 45 years    Types: Cigarettes    Quit date: 09/30/2003  . Smokeless tobacco: Never Used  . Alcohol Use: No  . Drug Use: No   Review of Systems General: No chills, fever, night sweats or weight changes  Cardiovascular:  No edema, orthopnea, palpitations, paroxysmal nocturnal dyspnea Dermatological: No rash, lesions or masses Respiratory: No cough Urologic: No hematuria, dysuria Abdominal: No nausea, vomiting, diarrhea, bright red blood per rectum, melena, or hematemesis Neurologic: No visual changes, weakness, changes in mental status All other systems reviewed and are otherwise negative except as noted above.  Physical Exam Vitals: Blood pressure 163/75, pulse 71, temperature 98 F (36.7 C), temperature source Oral, resp. rate 22, height 5\' 3"  (1.6 m), weight 179 lb 14.3 oz (81.6 kg), SpO2 99.00%.  General: Well developed, well appearing 72 y.o. female in no acute distress. HEENT: Normocephalic, atraumatic. EOMs intact. Sclera nonicteric. Oropharynx clear.  Neck: Supple. No JVD. Lungs: Respirations regular and unlabored, CTA bilaterally. No wheezes, rales or rhonchi. Heart: RRR. S1, S2 present. No murmurs, rub, S3 or S4. Abdomen: Soft, non-tender, non-distended. BS present x 4 quadrants. No hepatosplenomegaly.  Extremities: No clubbing, cyanosis or edema. DP/PT/Radials 2+ and equal bilaterally. Psych: Normal affect. Neuro: Alert and oriented X 3. Moves all extremities  spontaneously. Musculoskeletal: No kyphosis. Skin: Intact. Warm and dry. No rashes or petechiae in exposed areas.   Labs  Recent Labs  08/28/13 0001 08/28/13 0515 08/28/13 1238  TROPONINI <0.30 <0.30 <0.30   Lab Results  Component Value Date   WBC 13.3* 08/28/2013   HGB 8.5* 08/28/2013   HCT 28.3* 08/28/2013   MCV 108.0* 08/28/2013   PLT 216 08/28/2013    Recent Labs Lab 08/28/13 0515 08/29/13 0503  NA 136  --   K 4.5  --   CL 92*  --   CO2 24  --   BUN 44*  --   CREATININE 5.95*  --   CALCIUM 9.0  --   PROT  --  6.3  BILITOT  --  0.3  ALKPHOS  --  95  ALT  --  8  AST  --  15  GLUCOSE 190*  --     Recent Labs  17-Sep-2013 1620  TSH 0.851    Recent Labs  08/27/13 2310  INR 0.93    Radiology/Studies Dg Chest Portable 1 View 2013-09-17   IMPRESSION: Cardiomegaly with mild pulmonary vascular congestion.    Electronically Signed By: Laveda Abbe M.D. On: 2013/09/17 00:00   Echocardiogram  pending  12-lead ECG on admission - junctional rhythm at 49 bpm Telemetry - SR currently; no further pauses since 08/27/2013 23:00   Assessment and Plan 1. Junctional bradycardia with pauses 2. PAF 3. Epigastric pain 4. CAD 5. ESRD on HD 6. COPD 7. GI bleed April 2014, now off anticoagulation  She remains in SR. Continue to hold amiodarone, Coreg and diltiazem. Follow telemetry. Will request records from Dr. Dulce Sellar.   Dr. Graciela Husbands to see Signed, Rick Duff, PA-C 08/29/2013, 8:08 AM  Pt with bradycardia now improved off amio dilt and bb; unlikelythat stopping amio has had much affect so will resume it and stop the other two and follow Will downtitrate amio >>200 daily  Warfarin was stopped by [facility] understood by patient that amio was to replace it CHADSVAS score 4-5  GI bleeding 4/14 withhemorrhoids Would resume anticoagulation  This is particularly important if pt has HCM as rasied by echo in 9/14  Would doMRI as this has major implications for family and  antocoagulation

## 2013-08-29 NOTE — Progress Notes (Signed)
Pharmacy consulted to dose lovenox for VTE prophylaxis in this 72yof with ESRD on HD. Was on coumadin for PAF but this was discontinued by Dr. Dulce Sellar 07/13/13 due to GI bleed that occurred in April 2014. EP favors resuming anticoagulation given her ChadsVasc score 4-5, but will await cardiac MRI to help with decision.   Plan: 1) Lovenox 30mg  sq q24 2) Follow up further anticoagulation plans  Louie Casa, PharmD, BCPS 08/29/13, 10:12 AM

## 2013-08-29 NOTE — Procedures (Signed)
I was present at this dialysis session, have reviewed the session itself and made  appropriate changes   Vinson Moselle MD  pager [phone]    cell [phone]  08/29/2013, 3:22 PM

## 2013-08-29 NOTE — Progress Notes (Signed)
[facility] Progress Note [place] TEAM 1 - Stepdown/ICU TEAM   [name] WUJ:[id number] DOB: 11/08/1940 DOA: 08/27/2013 PCP: [name], MD  Admit HPI / Brief Narrative: 72 y.o. female with multiple medical problems including ESRD on dialysis and atrial fibrillation who presented to the ED with complaints of chest pain, weakness, and feeling as if she would pass out. She reported having increased DOE. She was evaluated in the ED and was found to be in a junctional rhythm with pauses for close to 10 seconds. Cardiology was consulted by the EDP and she was referred for medical admission.   HPI/Subjective: The pt is seen on HD.  She states that she feels much better thanks to dialysis.  She denies chest pressure/chest pain.  She does admit to intermittent feelings of dizziness/lightheadedness.  She denies shortness of breath.  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 based on echo earlier this year - volume management per HD - EP has ordered a cardiac MRI for further evaluation  ESRD on HD On MWF HD - Nephrology following   HTN Follow blood pressure post dialysis  Anemia in chronic kidney disease  Sleep apnea  DM2 CBG is recently controlled at this time - follow trend  PVD s/p B CEA 2012  MRSA screen + Usual contact precautions  Hx of renal cell CA s/p R nephrectomy 2005  Code Status: FULL Family Communication: no family present at time of exam Disposition Plan:  SDU  Consultants: Cardiology EP Neprhrology  Procedures: None  Antibiotics: None  DVT prophylaxis: lovenox  Objective: Blood pressure 163/40, pulse 65, temperature 98.2 F (36.8 C), temperature source Oral, resp. rate 21, height 5\' 3"  (1.6 m), weight 81.6 kg (179 lb 14.3 oz), SpO2 99.00%.  Intake/Output Summary (Last 24 hours) at 08/29/13 1425 Last data filed at 08/29/13 0509  Gross per 24 hour  Intake    480 ml  Output    825 ml  Net   -345 ml   Exam: General: No acute respiratory distress on dialysis Lungs: Mild bibasilar crackles with no wheeze but good air movement throughout other fields Cardiovascular: Regular rate and rhythm without murmur gallop or rub normal S1 and S2 Abdomen: Nontender, nondistended, soft, bowel sounds positive, no rebound, no ascites, no appreciable mass Extremities: No significant cyanosis, clubbing, or edema bilateral lower extremities  Data Reviewed: Basic Metabolic Panel:  Recent Labs Lab 08/27/13 2219 08/27/13 2250 08/28/13 0515  NA 139  --  136  K 4.2  --  4.5  CL 95*  --  92*  CO2 33*  --  24  GLUCOSE 115*  --  190*  BUN 41*  --  44*  CREATININE 5.47*  --  5.95*  CALCIUM 9.1  --  9.0  MG  --  2.3  --    Liver Function Tests:  Recent Labs Lab 08/29/13 0503  AST 15  ALT 8  ALKPHOS 95  BILITOT 0.3  PROT 6.3  ALBUMIN 3.0*   No results found for this basename: LIPASE, AMYLASE,  in the last 168 hours No results found  for this basename: AMMONIA,  in the last 168 hours CBC:  Recent Labs Lab 08/27/13 2219 08/28/13 0515  WBC 15.6* 13.3*  HGB 9.8* 8.5*  HCT 33.3* 28.3*  MCV 109.9* 108.0*  PLT 245 216   Cardiac Enzymes:  Recent Labs Lab 08/28/13 0001 08/28/13 0515 08/28/13 1238  TROPONINI <0.30 <0.30 <0.30   BNP (last 3 results)  Recent Labs  10/19/12 1605 04/13/13 1644 08/27/13 2220  PROBNP 17356.0* 12692.0* 14565.0*   CBG:  Recent Labs Lab 08/28/13 1238 08/28/13 1700 08/28/13 2056  08/29/13 0809 08/29/13 1224  GLUCAP 130* 135* 180* 118* 101*    Recent Results (from the past 240 hour(s))  MRSA PCR SCREENING     Status: Abnormal   Collection Time    08/28/13  2:01 AM      Result Value Range Status   MRSA by PCR POSITIVE (*) NEGATIVE Final   Comment:            The GeneXpert MRSA Assay (FDA     approved for NASAL specimens     only), is one component of a     comprehensive MRSA colonization     surveillance program. It is not     intended to diagnose MRSA     infection nor to guide or     monitor treatment for     MRSA infections.     RESULT CALLED TO, READ BACK BY AND VERIFIED WITH:     CALLED TO RN [facility] WHITE [id number] @0608  THANEY  MRSA PCR SCREENING     Status: Abnormal   Collection Time    08/28/13  2:00 PM      Result Value Range Status   MRSA by PCR POSITIVE (*) NEGATIVE Final   Comment:            The GeneXpert MRSA Assay (FDA     approved for NASAL specimens     only), is one component of a     comprehensive MRSA colonization     surveillance program. It is not     intended to diagnose MRSA     infection nor to guide or     monitor treatment for     MRSA infections.     RESULT CALLED TO, READ BACK BY AND VERIFIED WITH:     J.[facility] 2039 08/28/13 M.CAMPBELL     Studies:  Recent x-ray studies have been reviewed in detail by the Attending Physician  Scheduled Meds:  Scheduled Meds: . amiodarone  200 mg Oral Daily  . amLODipine  5 mg Oral Daily  . antiseptic oral rinse  15 mL Mouth Rinse BID  . atropine  1 mg Intravenous Once  . calcium acetate  1,334 mg Oral TID WC  . Chlorhexidine Gluconate Cloth  6 each Topical Q0600  . darbepoetin  60 mcg Intravenous Q Mon-HD  . enoxaparin (LOVENOX) injection  30 mg Subcutaneous Q24H  . ezetimibe  10 mg Oral QHS  . ferric gluconate (FERRLECIT/NULECIT) IV  125 mg Intravenous Q M,W,F-HD  . insulin aspart  0-5 Units Subcutaneous QHS  . insulin aspart  0-9 Units Subcutaneous TID WC  .  multivitamin  1 tablet Oral QHS  . mupirocin ointment  1 application Nasal BID  . rOPINIRole  1 mg Oral BID  . sodium chloride  3 mL Intravenous Q12H    Time spent on care of this patient: 35 mins   [facility] T  [facility] Office  [phone] Pager - Text  Page per Loretha Stapler as per below:  On-Call/Text Page:      [email or web address]      password [email or web address]  If 7PM-7AM, please contact night-coverage [email or web address] Password [email or web address] 08/29/2013, 2:25 PM   LOS: 2 days

## 2013-08-29 NOTE — Consult Note (Signed)
Renal Service Consult Note [facility]  ELLIANNA Cook 08/29/2013 Valborg Friar D Requesting Physician:  Dr Sharon Seller  Reason for Consult:  ESRD pt with COPD, syncope and bradycardia HPI: The patient is a 72 y.o. year-old with hx of HTN, afib, copd on home O2, DM2 and esrd presented with chest pain and presyncope.  Found in ED to be in a junctional rhythm with pauses.  Seen by cardiology, was taking amio, BB and diltiazem. She rec'd atropine and temp external pacer. Now is in sinus rhythm and BB and diltiazem are on hold.   Currently pt reports SOB, "feels like fluid", worse with exertion.  Denies CP   ROS  no ha, confusion or hallucination  no abd pain, n/v/d  no skin rash   no problems with HD access  has not missed any HD lately  Past Medical History  Past Medical History  Diagnosis Date  . Carotid artery occlusion     bilat CEA in 2012  . C. 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 Hypertension   . Atrial fibrillation April  2013  . Pulmonary hypertension   . Secondary hyperparathyroidism, renal   . COPD (chronic obstructive pulmonary disease)   . Hypercholesterolemia   . History of MI (myocardial infarction)     "they say I had 2 years ago" (08/20/2012)  . GERD (gastroesophageal reflux disease)   . History of pneumonia     "have it alot" (08/20/2012)  . Sleep apnea     "don't wear mask anymore"  . Type II diabetes mellitus   . History of blood transfusion 2013    "3 times so far in 2013:  4 pints one time; 2 pints another; ?# last time" (08/20/2012)  . Iron deficiency anemia   . Seizures 2012    after carotid surgery  . ESRD on dialysis     "[place]; Tues, Thurs; Sat"  . Renal cell carcinoma 2005?  Marland Kitchen On home oxygen therapy     "24/7"   . Acute diverticulitis     Oct 2013, treated medically [facility], sigmoid diverticulitits  . CHF (congestive heart failure)    Past Surgical History  Past Surgical History  Procedure  Laterality Date  . Nephrectomy  2005    Right, for Renal cell carcinoma  . Carotid endarterectomy  2012    bilaterally; Dr. Hart Rochester  . Av fistula placement  Jan. 7, 2011    Right  upper arm by Dr. Hart Rochester  . Hd catheter  Aug. 22, 2013    [facility]  . Appendectomy      childhood  . Abdominal hysterectomy  1971?  Marland Kitchen Coronary angioplasty with stent placement  1990's    "1 total"  . Coronary angioplasty  1990's  . Av fistula repair  2013    right upper arm  . Esophagogastroduodenoscopy N/A 12/29/2012    Procedure: ESOPHAGOGASTRODUODENOSCOPY (EGD);  Surgeon: Petra Kuba, MD;  Location: [facility] ENDOSCOPY;  Service: Endoscopy;  Laterality: N/A;  . Colonoscopy with propofol N/A 12/31/2012    Procedure: COLONOSCOPY WITH PROPOFOL;  Surgeon: Petra Kuba, MD;  Location: [facility] ENDOSCOPY;  Service: Endoscopy;  Laterality: N/A;  currently IP at [facility] 3307   Family History  Family History  Problem Relation Age of Onset  . Cancer Mother     pancreatic  . Stroke Father   . Coronary artery disease Father   . Cancer Brother     Brain  .  Kidney disease Brother     Kidney stones   Social History  reports that she quit smoking about 9 years ago. Her smoking use included Cigarettes. She has a 67.5 pack-year smoking history. She has never used smokeless tobacco. She reports that she does not drink alcohol or use illicit drugs. Allergies  Allergies  Allergen Reactions  . Morphine And Related Nausea And Vomiting  . Lipitor [Atorvastatin] Other (See Comments)    Causes weakness and drowsiness  . Nsaids     Unknown reported by previous hospital   Home medications Prior to Admission medications   Medication Sig Start Date End Date Taking? Authorizing Provider  albuterol (PROVENTIL HFA;VENTOLIN HFA) 108 (90 BASE) MCG/ACT inhaler Inhale 1-2 puffs into the lungs 2 (two) times daily as needed for wheezing or shortness of breath.    Yes Historical Provider, MD  amiodarone (PACERONE) 400 MG tablet Take 400 mg by  mouth daily.    Yes Historical Provider, MD  calcium acetate (PHOSLO) 667 MG capsule Take 2,001 mg by mouth 3 (three) times daily with meals.   Yes Historical Provider, MD  carvedilol (COREG) 25 MG tablet Take 6.25-25 mg by mouth daily with lunch. On dialysis days Mon.,Wed. Friday takes 25 mg (1 tablet) after dialysis and takes 6.25 mg (1/4th tablet) on non-dialysis days Tues., Thurs. Sat. And Sunday.   Yes Historical Provider, MD  diltiazem (CARTIA XT) 240 MG 24 hr capsule Take 1 capsule (240 mg total) by mouth daily. Please hold this medication on the morning of dialysis. He may take this medication watch her dialysis session is complete. 06/24/13  Yes Russella Dar, NP  ezetimibe (ZETIA) 10 MG tablet Take 10 mg by mouth at bedtime.    Yes Historical Provider, MD  glipiZIDE (GLUCOTROL) 5 MG tablet Take 5 mg by mouth daily as needed (if CBG is >150).   Yes Historical Provider, MD  multivitamin (RENA-VIT) TABS tablet Take 1 tablet by mouth daily.   Yes Historical Provider, MD  rOPINIRole (REQUIP) 1 MG tablet Take 1 mg by mouth 2 (two) times daily.   Yes Historical Provider, MD  fluticasone (FLOVENT HFA) 110 MCG/ACT inhaler Inhale 2 puffs into the lungs 3 (three) times daily as needed (for wheezing and shortness of breath).    Historical Provider, MD   Liver Function Tests  Recent Labs Lab 08/29/13 0503  AST 15  ALT 8  ALKPHOS 95  BILITOT 0.3  PROT 6.3  ALBUMIN 3.0*   No results found for this basename: LIPASE, AMYLASE,  in the last 168 hours CBC  Recent Labs Lab 08/27/13 2219 08/28/13 0515  WBC 15.6* 13.3*  HGB 9.8* 8.5*  HCT 33.3* 28.3*  MCV 109.9* 108.0*  PLT 245 216   Basic Metabolic Panel  Recent Labs Lab 08/27/13 2219 08/28/13 0515  NA 139 136  K 4.2 4.5  CL 95* 92*  CO2 33* 24  GLUCOSE 115* 190*  BUN 41* 44*  CREATININE 5.47* 5.95*  CALCIUM 9.1 9.0     Exam  Blood pressure 181/55, pulse 78, temperature 97.5 F (36.4 C), temperature source Oral, resp. rate  20, height 5\' 3"  (1.6 m), weight 81.6 kg (179 lb 14.3 oz), SpO2 94.00%.  gen: alert, elderly female in no distress  skin: no rash, cyanosis  heent: eomi, sclera anicteric, throat clear  neck: + jvd, no LAN  chest: clear bilat, moderately reduced air movement throughout, no wheezing  cor: regular, rusb 2/6 M, no rub or gallop  abd:  soft, nontender, nd, no ascites or hsm  ext: no leg or UE edema, no joint effusion, no gangrene/ulcers  neuro: alert, ox3, nonfocal  Outpt HD: [place] MWF 4hrs  F160   78kg   2/2.25 bath   400/A1.5   Heparin none   RUA AVF Hectorol- none   EPO 10000   Venofer 100mg /hd (11/26 > 12/6) Recent labs: tsat 23%  pth 129 phos 5.5  Assessment: 1. Bradycardia / presyncope- improved, holding BB and CCB 2. Dyspnea: underlying copd on home O2, possible vol excess, plan HD w vol removal today 3. ESRD: HD tdoay 4. Afib: in nsr, po amio, per cardiology 5. HTN/volume: per cardiology coreg and dilt on hold, lower vol w hd today 6. 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 Anemia of ckd: cont epo, IV fe load 8. COPD: on albuterol nebs, O2 [place]   Vinson Moselle MD  pager [phone]    cell [phone]  08/29/2013, 11:22 AM

## 2013-08-30 ENCOUNTER — Inpatient Hospital Stay (HOSPITAL_COMMUNITY): Payer: Medicare Other

## 2013-08-30 ENCOUNTER — Other Ambulatory Visit (HOSPITAL_COMMUNITY): Payer: Medicare Other

## 2013-08-30 ENCOUNTER — Ambulatory Visit: Payer: Medicare Other | Admitting: Family

## 2013-08-30 DIAGNOSIS — D62 Acute posthemorrhagic anemia: Secondary | ICD-10-CM

## 2013-08-30 DIAGNOSIS — J9600 Acute respiratory failure, unspecified whether with hypoxia or hypercapnia: Secondary | ICD-10-CM

## 2013-08-30 DIAGNOSIS — G9340 Encephalopathy, unspecified: Secondary | ICD-10-CM

## 2013-08-30 LAB — GLUCOSE, CAPILLARY
Glucose-Capillary: 91 mg/dL (ref 70–99)
Glucose-Capillary: 106 mg/dL — ABNORMAL HIGH (ref 70–99)
Glucose-Capillary: 80 mg/dL (ref 70–99)

## 2013-08-30 LAB — URINALYSIS, ROUTINE W REFLEX MICROSCOPIC
Glucose, UA: 250 mg/dL — AB
Nitrite: NEGATIVE
Specific Gravity, Urine: 1.012 (ref 1.005–1.030)
pH: 7 (ref 5.0–8.0)

## 2013-08-30 LAB — URINE MICROSCOPIC-ADD ON

## 2013-08-30 LAB — CBC
HCT: 31.5 % — ABNORMAL LOW (ref 36.0–46.0)
Hemoglobin: 9.2 g/dL — ABNORMAL LOW (ref 12.0–15.0)
MCV: 109.8 fL — ABNORMAL HIGH (ref 78.0–100.0)
RBC: 2.87 MIL/uL — ABNORMAL LOW (ref 3.87–5.11)
WBC: 15.1 10*3/uL — ABNORMAL HIGH (ref 4.0–10.5)

## 2013-08-30 MED ORDER — HYDROCORTISONE 2.5 % RE CREA
TOPICAL_CREAM | Freq: Two times a day (BID) | RECTAL | Status: DC
  Administered 2013-08-30 – 2013-08-31 (×3): via RECTAL
  Filled 2013-08-30: qty 28.35

## 2013-08-30 NOTE — Care Management Note (Signed)
    Page 1 of 1   08/30/2013     12:05:25 PM   CARE MANAGEMENT NOTE 08/30/2013  Patient:  [name], [name]   Account Number:  [id number]  Date Initiated:  08/29/2013  Documentation initiated by:  Junius Creamer  Subjective/Objective Assessment:   adm w bradycardia     Action/Plan:   lives alone?, pcp dr Sharlot Gowda Shary Decamp   Anticipated DC Date:     Anticipated DC Plan:        DC Planning Services  CM consult      Choice offered to / List presented to:             Status of service:   Medicare Important Message given?   (If response is "NO", the following Medicare IM given date fields will be blank) Date Medicare IM given:   Date Additional Medicare IM given:    Discharge Disposition:    Per UR Regulation:  Reviewed for med. necessity/level of care/duration of stay  If discussed at Long Length of Stay Meetings, dates discussed:    Comments:  12/2 1033 Suzanne Jakyri Brunkhorst rn,bsn have asked cm sec to ck for prior auth and what copay would be for eliquis.gave pt 30day free eliquis if goes home on this drug. pt has 45.03 pe rmonth copay but needs prior auth. have filled out [employer] rx form and faxed in to requis prior auth.

## 2013-08-30 NOTE — Progress Notes (Signed)
Pharmacist Heart Failure Core Measure Documentation  Assessment: [name] has an EF documented as 65-70% on 10/22/12 by ECHO.  Rationale: Heart failure patients with left ventricular systolic dysfunction (LVSD) and an EF < 40% should be prescribed an angiotensin converting enzyme inhibitor (ACEI) or angiotensin receptor blocker (ARB) at discharge unless a contraindication is documented in the medical record.  This patient is not currently on an ACEI or ARB for HF.  This note is being placed in the record in order to provide documentation that a contraindication to the use of these agents is present for this encounter.  ACE Inhibitor or Angiotensin Receptor Blocker is contraindicated (specify all that apply)  []   ACEI allergy AND ARB allergy []   Angioedema []   Moderate or severe aortic stenosis []   Hyperkalemia []   Hypotension []   Renal artery stenosis [x]   Worsening renal function, preexisting renal disease or dysfunction   Gardner Candle 08/30/2013 8:32 AM

## 2013-08-30 NOTE — Progress Notes (Signed)
Patient ID: [name]  female  WUJ:[id number]    DOB: 11/03/1940    DOA: 08/27/2013  PCP: [name], MD  Assessment/Plan: Principal Problem: Junctional Bradycardia / presyncope  was on Coreg, amiodarone, and diltiazem at home - care as per Cardiology  - Continue amiodarone per cardiology - Cardiac MRI today - Placed case manager consult for eliquis. 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 Hemodialysis tomorrow.     Subjective: Patient seen and examined, feeling a whole lot better.  Objective: Weight change: 2.4 kg (5 lb 4.7 oz)  Intake/Output Summary (Last 24 hours) at 08/30/13 1557 Last data filed at 08/30/13 1200  Gross per 24 hour  Intake   1080 ml  Output   4325 ml  Net  -3245 ml   Blood pressure 145/46, pulse 74, temperature 97.5 F (36.4 C), temperature source Oral, resp. rate 19, height 5\' 4"  (1.626 m), weight 78.7 kg (173 lb 8 oz), SpO2 99.00%.  Physical Exam: General: Alert and awake, oriented x3, not in any acute  distress. CVS: S1-S2 clear, no murmur rubs or gallops Chest:  mild bibasal crackles  Abdomen: soft nontender, nondistended, normal bowel sounds  Extremities: no cyanosis, clubbing or edema noted bilaterally Neuro: Cranial nerves II-XII intact, no focal neurological deficits  Lab Results: Basic Metabolic Panel:  Recent Labs Lab 08/27/13 2219 08/27/13 2250 08/28/13 0515  NA 139  --  136  K 4.2  --  4.5  CL 95*  --  92*  CO2 33*  --  24  GLUCOSE 115*  --  190*  BUN 41*  --  44*  CREATININE 5.47*  --  5.95*  CALCIUM 9.1  --  9.0  MG  --  2.3  --    Liver Function Tests:  Recent Labs Lab 08/29/13 0503  AST 15  ALT 8  ALKPHOS 95  BILITOT 0.3  PROT 6.3  ALBUMIN 3.0*   No results found for this basename: LIPASE, AMYLASE,  in the last 168 hours No results found for this basename: AMMONIA,  in the last 168 hours CBC:  Recent Labs Lab 08/28/13 0515 08/30/13 0415  WBC 13.3* 15.1*  HGB 8.5* 9.2*  HCT 28.3* 31.5*  MCV 108.0* 109.8*  PLT 216 223   Cardiac Enzymes:  Recent Labs Lab 08/28/13 0001 08/28/13 0515 08/28/13 1238  TROPONINI <0.30 <0.30 <0.30   BNP: No components found with this basename: POCBNP,  CBG:  Recent Labs Lab 08/29/13 0809  08/29/13 1224 08/29/13 2152 08/30/13 0740 08/30/13 1206  GLUCAP 118* 101* 120* 117* 91     Micro Results: Recent Results (from the past 240 hour(s))  MRSA PCR SCREENING     Status: Abnormal   Collection Time    08/28/13  2:01 AM      Result Value Range Status   MRSA by PCR POSITIVE (*) NEGATIVE Final   Comment:            The GeneXpert MRSA Assay (FDA     approved for NASAL specimens     only), is one component of a     comprehensive MRSA colonization     surveillance program. It is not     intended to diagnose MRSA     infection nor to guide or     monitor treatment for     MRSA infections.     RESULT CALLED TO, READ BACK BY AND VERIFIED WITH:     CALLED TO RN [facility] WHITE [phone] @[id number]  THANEY  MRSA  PCR SCREENING     Status: Abnormal   Collection Time    08/28/13  2:00 PM      Result Value Range Status   MRSA by PCR POSITIVE (*) NEGATIVE Final   Comment:            The GeneXpert MRSA Assay (FDA     approved for NASAL specimens     only), is one component of a     comprehensive MRSA colonization     surveillance program. It is not     intended to diagnose MRSA     infection nor to guide or     monitor treatment for     MRSA infections.     RESULT CALLED TO, READ BACK BY AND VERIFIED WITH:     J.[facility] 2039 08/28/13 M.CAMPBELL    Studies/Results: Dg Chest Portable 1 View  08/28/2013   CLINICAL DATA:  Chest pain.  EXAM: PORTABLE CHEST - 1 VIEW  COMPARISON:  06/24/2013 and prior chest radiographs  FINDINGS: Cardiomegaly and mild pulmonary vascular congestion noted.  A defibrillator pad overlying the chest is present.  Mild left basilar atelectasis noted.  There is no evidence of focal airspace disease, pulmonary edema, suspicious pulmonary nodule/mass, pleural effusion, or pneumothorax. No acute bony abnormalities are identified.  IMPRESSION: Cardiomegaly with mild pulmonary vascular congestion.   Electronically Signed   By: Laveda Abbe M.D.   On: 08/28/2013 00:00    Medications: Scheduled Meds: . amiodarone  200 mg Oral Daily  . amLODipine  5 mg Oral Daily  . antiseptic oral rinse  15 mL Mouth Rinse BID  . calcium acetate  1,334 mg Oral TID WC  . Chlorhexidine Gluconate Cloth  6 each Topical Q0600  . darbepoetin  60 mcg Intravenous Q Mon-HD  . enoxaparin (LOVENOX) injection  30 mg Subcutaneous Q24H  . ezetimibe  10 mg Oral QHS  . ferric gluconate (FERRLECIT/NULECIT) IV  125 mg Intravenous Q M,W,F-HD  . hydrocortisone   Rectal BID  . insulin aspart  0-5 Units Subcutaneous QHS  . insulin aspart  0-9 Units Subcutaneous TID WC  . multivitamin  1 tablet Oral QHS  . mupirocin ointment  1 application Nasal BID  . rOPINIRole  1 mg Oral BID      LOS: 3 days   RAI,RIPUDEEP  M.D. [facility] 08/30/2013, 3:57 PM Pager: [phone]  If 7PM-7AM, please contact night-coverage [email or web address] Password [email or web address]

## 2013-08-30 NOTE — Evaluation (Signed)
Physical Therapy Evaluation Patient Details Name: [name] MRN: [id number] DOB: May 02, 1941 Today's Date: 08/30/2013 Time: 5956-3875 PT Time Calculation (min): 12 min  PT Assessment / Plan / Recommendation History of Present Illness  Pt adm with presyncope and bradycardia.  Clinical Impression  Pt doing well with mobility and no further PT needed.  Ready for dc from PT standpoint.    PT Assessment  Patent does not need any further PT services    Follow Up Recommendations  No PT follow up    Does the patient have the potential to tolerate intense rehabilitation      Barriers to Discharge        Equipment Recommendations  None recommended by PT    Recommendations for Other Services     Frequency      Precautions / Restrictions Precautions Precautions: None   Pertinent Vitals/Pain VSS. HR 70's      Mobility  Transfers Transfers: Sit to Stand;Stand to Sit Sit to Stand: 7: Independent Stand to Sit: 7: Independent Ambulation/Gait Ambulation/Gait Assistance: 7: Independent Ambulation Distance (Feet): 20 Feet Assistive device: None Gait Pattern: Within Functional Limits    Exercises     PT Diagnosis:    PT Problem List:   PT Treatment Interventions:       PT Goals(Current goals can be found in the care plan section) Acute Rehab PT Goals PT Goal Formulation: No goals set, d/c therapy  Visit Information  Last PT Received On: 08/30/13 Assistance Needed: +1 History of Present Illness: Pt adm with presyncope and bradycardia.       Prior Functioning  Home Living Family/patient expects to be discharged to:: Private residence Living Arrangements: Children Available Help at Discharge: Family;Available PRN/intermittently Type of Home: House Home Access: Stairs to enter [employer] of Steps: 1 Entrance Stairs-Rails: None Home Layout: One level Home Equipment: Environmental consultant - 2 wheels Prior Function Level of Independence:  Independent Communication Communication: No difficulties    Cognition  Cognition Arousal/Alertness: Awake/alert Behavior During Therapy: WFL for tasks assessed/performed Overall Cognitive Status: Within Functional Limits for tasks assessed    Extremity/Trunk Assessment Upper Extremity Assessment Upper Extremity Assessment: Overall WFL for tasks assessed Lower Extremity Assessment Lower Extremity Assessment: Overall WFL for tasks assessed   Balance Balance Balance Assessed: Yes Static Standing Balance Static Standing - Balance Support: No upper extremity supported Static Standing - Level of Assistance: 7: Independent  End of Session PT - End of Session Activity Tolerance: Patient tolerated treatment well Patient left: in chair;with call bell/phone within reach Nurse Communication: Mobility status (spoke to nurse tech)  GP     Revision [facility] 08/30/2013, 3:32 PM  [facility] PT [phone]

## 2013-08-30 NOTE — Progress Notes (Signed)
Patient Name: [name]      SUBJECTIVE: admitted with near syncope and high grade bradycardia assoc awith amio dilt and beta blockers for PAF with Rapid Af amio continued  Also question of Apical HCM and MRI is pending  Anticoagulation resumed with hx of GI bleeding ? Hemorrhoids all that was found  Feeling better  Past Medical History  Diagnosis Date  . Carotid artery occlusion     bilat CEA in 2012  . C. 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 Hypertension   . Atrial fibrillation April  2013  . Pulmonary hypertension   . Secondary hyperparathyroidism, renal   . COPD (chronic obstructive pulmonary disease)   . Hypercholesterolemia   . History of MI (myocardial infarction)     "they say I had 2 years ago" (08/20/2012)  . GERD (gastroesophageal reflux disease)   . History of pneumonia     "have it alot" (08/20/2012)  . Sleep apnea     "don't wear mask anymore"  . Type II diabetes mellitus   . History of blood transfusion 2013    "3 times so far in 2013:  4 pints one time; 2 pints another; ?# last time" (08/20/2012)  . Iron deficiency anemia   . Seizures 2012    after carotid surgery  . ESRD on dialysis     "[place]; Tues, Thurs; Sat"  . Renal cell carcinoma 2005?  [place] Kitchen On home oxygen therapy     "24/7"   . Acute diverticulitis     Oct 2013, treated medically [facility], sigmoid diverticulitits  . CHF (congestive heart failure)     Scheduled Meds:  Scheduled Meds: . amiodarone  200 mg Oral Daily  . amLODipine  5 mg Oral Daily  . antiseptic oral rinse  15 mL Mouth Rinse BID  . atropine  1 mg Intravenous Once  . calcium acetate  1,334 mg Oral TID WC  . Chlorhexidine Gluconate Cloth  6 each Topical Q0600  . darbepoetin  60 mcg Intravenous Q Mon-HD  . enoxaparin (LOVENOX) injection  30 mg Subcutaneous Q24H  . ezetimibe  10 mg Oral QHS  . ferric gluconate (FERRLECIT/NULECIT) IV  125 mg Intravenous Q M,W,F-HD  . insulin  aspart  0-5 Units Subcutaneous QHS  . insulin aspart  0-9 Units Subcutaneous TID WC  . multivitamin  1 tablet Oral QHS  . mupirocin ointment  1 application Nasal BID  . rOPINIRole  1 mg Oral BID   Continuous Infusions:   PHYSICAL EXAM Filed Vitals:   08/29/13 2300 08/29/13 2330 08/30/13 0300 08/30/13 0420  BP:  132/107  146/80  Pulse:      Temp: 98 F (36.7 C)  97.3 F (36.3 C)   TempSrc: Oral  Oral   Resp:  19  23  Height:      Weight:      SpO2:  94%  100%    RRR Clear AXO\ Skin warm and dry   TELEMETRY: Reviewed telemetry pt in NSR without pauses   Intake/Output Summary (Last 24 hours) at 08/30/13 0821 Last data filed at 08/30/13 0500  Gross per 24 hour  Intake    480 ml  Output   3725 ml  Net  -3245 ml    LABS: Basic Metabolic Panel:  Recent Labs Lab 08/27/13 2219 08/27/13 2250 08/28/13 0515  NA 139  --  136  K 4.2  --  4.5  CL 95*  --  92*  CO2 33*  --  24  GLUCOSE 115*  --  190*  BUN 41*  --  44*  CREATININE 5.47*  --  5.95*  CALCIUM 9.1  --  9.0  MG  --  2.3  --    Cardiac Enzymes:  Recent Labs  08/28/13 0001 08/28/13 0515 08/28/13 1238  TROPONINI <0.30 <0.30 <0.30   CBC:  Recent Labs Lab 08/27/13 2219 08/28/13 0515 08/30/13 0415  WBC 15.6* 13.3* 15.1*  HGB 9.8* 8.5* 9.2*  HCT 33.3* 28.3* 31.5*  MCV 109.9* 108.0* 109.8*  PLT 245 216 223   PROTIME:  Recent Labs  08/27/13 2310  LABPROT 12.3  INR 0.93   Liver Function Tests:  Recent Labs  08/29/13 0503  AST 15  ALT 8  ALKPHOS 95  BILITOT 0.3  PROT 6.3  ALBUMIN 3.0*   No results found for this basename: LIPASE, AMYLASE,  in the last 72 hours BNP: BNP (last 3 results)    ASSESSMENT AND PLAN:  Principal Problem:   Junctional bradycardia Active Problems:   CKD (chronic kidney disease) stage V requiring chronic dialysis   DM2 (diabetes mellitus, type 2)   Pulmonary edema   COPD (chronic obstructive pulmonary disease)   HTN (hypertension)   Atrial  fibrillation   Pre-syncope   Anemia in chronic kidney disease   Chest pain  Rhythm seems to be tolerating amio in absence of dilt and BB Continue amio Would begin warfarin; might also be candidate for apixoban if pt can afford? Social services consultation Transfer to floor is ok Await MRI to clariofy dx of possible HCM  Signed, Sherryl Manges MD  08/30/2013

## 2013-08-30 NOTE — Progress Notes (Signed)
Report called to receiving RN [phone]. Will transfer via WC.

## 2013-08-30 NOTE — Progress Notes (Signed)
[facility] Progress Note    Subjective: No complaints, breathing better after HD yest. Hb up over 9 today   Exam  Blood pressure 132/46, pulse 71, temperature 99 F (37.2 C), temperature source Oral, resp. rate 19, height 5\' 3"  (1.6 m), weight 78.6 kg (173 lb 4.5 oz), SpO2 100.00%. gen: alert, elderly female in no distress  neck: + jvd, no LAN  chest: clear bilat cor: regular, rusb 2/6 M, no rub or gallop  abd: soft, nontender, nd, no ascites or hsm  ext: no leg or UE edema, no joint effusion, no gangrene/ulcers  neuro: alert, ox3, nonfocal   Outpt HD: [place] MWF  4hrs F160 78kg 2/2.25 bath 400/A1.5 Heparin none RUA AVF  Hectorol- none EPO 10000 Venofer 100mg /hd (11/26 > 12/6)  Recent labs: tsat 23% pth 129 phos 5.5   Assessment:  1. Bradycardia / presyncope- improved, holding BB and CCB, still on po amio 2. Dyspnea: better after HD yest 3. ESRD MWF 4. Afib: in nsr, po amio per cardiology. Note that current UTD recommendations for anticoagulation in ESRD patients on dialysis with afib and average stroke risk is for no anticoagulation due to increased bleeding risk and no proven reduction in stroke.  For subgroup pts at very high risk (atrial thrombus, etc) the recommendation is for warfarin. 5. HTN/volume: right at dry weight 6. 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 Anemia of ckd: cont epo, IV fe load. She does not do well with Hb below 9 or so, but today Hb up so will not transfuse.  8. COPD: on albuterol nebs, O2 [place] 9. Dispo: stable from renal standpoint, per primary   Vinson Moselle MD  pager [phone]    cell [phone]  08/30/2013, 10:48 AM   Recent Labs Lab 08/27/13 2219 08/28/13 0515  NA 139 136  K 4.2 4.5  CL 95* 92*  CO2 33* 24  GLUCOSE 115* 190*  BUN 41* 44*  CREATININE 5.47* 5.95*  CALCIUM 9.1 9.0    Recent Labs Lab 08/29/13 0503  AST 15  ALT 8  ALKPHOS 95  BILITOT 0.3  PROT 6.3  ALBUMIN  3.0*    Recent Labs Lab 08/27/13 2219 08/28/13 0515 08/30/13 0415  WBC 15.6* 13.3* 15.1*  HGB 9.8* 8.5* 9.2*  HCT 33.3* 28.3* 31.5*  MCV 109.9* 108.0* 109.8*  PLT 245 216 223   . amiodarone  200 mg Oral Daily  . amLODipine  5 mg Oral Daily  . antiseptic oral rinse  15 mL Mouth Rinse BID  . atropine  1 mg Intravenous Once  . calcium acetate  1,334 mg Oral TID WC  . Chlorhexidine Gluconate Cloth  6 each Topical Q0600  . darbepoetin  60 mcg Intravenous Q Mon-HD  . enoxaparin (LOVENOX) injection  30 mg Subcutaneous Q24H  . ezetimibe  10 mg Oral QHS  . ferric gluconate (FERRLECIT/NULECIT) IV  125 mg Intravenous Q M,W,F-HD  . hydrocortisone   Rectal BID  . insulin aspart  0-5 Units Subcutaneous QHS  . insulin aspart  0-9 Units Subcutaneous TID WC  . multivitamin  1 tablet Oral QHS  . mupirocin ointment  1 application Nasal BID  . rOPINIRole  1 mg Oral BID     acetaminophen, acetaminophen, albuterol, alum & mag hydroxide-simeth, atropine, atropine, calcium acetate, fluticasone, HYDROmorphone (DILAUDID) injection, ipratropium, ondansetron (ZOFRAN) IV, ondansetron, oxyCODONE, zolpidem         gen: alert, elderly female in no distress  skin: no rash, cyanosis  heent: eomi, sclera anicteric, throat clear  neck: + jvd, no LAN  chest: clear bilat, moderately reduced air movement throughout, no wheezing  cor: regular, rusb 2/6 M, no rub or gallop  abd: soft, nontender, nd, no ascites or hsm  ext: no leg or UE edema, no joint effusion, no gangrene/ulcers  neuro: alert, ox3, nonfocal  Outpt HD: [place] MWF  4hrs F160 78kg 2/2.25 bath 400/A1.5 Heparin none RUA AVF  Hectorol- none EPO 10000 Venofer 100mg /hd (11/26 > 12/6)  Recent labs: tsat 23% pth 129 phos 5.5  Assessment:  10. Bradycardia / presyncope- improved, holding BB and CCB 11. Dyspnea: underlying copd on home O2, possible vol excess, plan HD w vol removal today 12. ESRD: HD tdoay 13. Afib: in nsr, po amio, per  cardiology 14. HTN/volume: per cardiology coreg and dilt on hold, lower vol w hd today 15. 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 Anemia of ckd: cont epo, IV fe load 17. COPD: on albuterol nebs, O2 [place] 18.

## 2013-08-31 DIAGNOSIS — I421 Obstructive hypertrophic cardiomyopathy: Secondary | ICD-10-CM

## 2013-08-31 DIAGNOSIS — J9620 Acute and chronic respiratory failure, unspecified whether with hypoxia or hypercapnia: Secondary | ICD-10-CM

## 2013-08-31 DIAGNOSIS — G4733 Obstructive sleep apnea (adult) (pediatric): Secondary | ICD-10-CM

## 2013-08-31 DIAGNOSIS — E213 Hyperparathyroidism, unspecified: Secondary | ICD-10-CM

## 2013-08-31 LAB — BASIC METABOLIC PANEL
BUN: 72 mg/dL — ABNORMAL HIGH (ref 6–23)
Chloride: 96 mEq/L (ref 96–112)
GFR calc Af Amer: 6 mL/min — ABNORMAL LOW (ref >90)
GFR calc non Af Amer: 5 mL/min — ABNORMAL LOW (ref >90)
Glucose, Bld: 88 mg/dL (ref 70–99)
Potassium: 4.8 mEq/L (ref 3.5–5.1)
Sodium: 137 mEq/L (ref 135–145)

## 2013-08-31 LAB — URINE CULTURE: Colony Count: 3000

## 2013-08-31 LAB — CBC
HCT: 30.1 % — ABNORMAL LOW (ref 36.0–46.0)
MCH: 31.5 pg (ref 26.0–34.0)
MCV: 105.2 fL — ABNORMAL HIGH (ref 78.0–100.0)
Platelets: 205 10*3/uL (ref 150–400)
RBC: 2.86 MIL/uL — ABNORMAL LOW (ref 3.87–5.11)
RDW: 15.5 % (ref 11.5–15.5)

## 2013-08-31 LAB — RENAL FUNCTION PANEL
BUN: 78 mg/dL — ABNORMAL HIGH (ref 6–23)
CO2: 25 mEq/L (ref 19–32)
Calcium: 8.4 mg/dL (ref 8.4–10.5)
Creatinine, Ser: 7.44 mg/dL — ABNORMAL HIGH (ref 0.50–1.10)
GFR calc non Af Amer: 5 mL/min — ABNORMAL LOW (ref >90)
Phosphorus: 4.2 mg/dL (ref 2.3–4.6)

## 2013-08-31 LAB — GLUCOSE, CAPILLARY: Glucose-Capillary: 97 mg/dL (ref 70–99)

## 2013-08-31 MED ORDER — IPRATROPIUM BROMIDE 0.02 % IN SOLN: 500.0000 ug | Freq: Four times a day (QID) | RESPIRATORY_TRACT | Status: DC | PRN

## 2013-08-31 MED ORDER — AMIODARONE HCL 200 MG PO TABS: 200.0000 mg | ORAL_TABLET | Freq: Every day | ORAL | Status: DC

## 2013-08-31 MED ORDER — AMLODIPINE BESYLATE 10 MG PO TABS: 10.0000 mg | ORAL_TABLET | Freq: Every day | ORAL | Status: DC

## 2013-08-31 NOTE — Discharge Summary (Signed)
Physician Discharge Summary  NEL STONEKING EAV:[id number] DOB: 12/21/1940 DOA: 08/27/2013  PCP: [name], MD  Admit date: 08/27/2013 Discharge date: 08/31/2013  Time spent: >30 minutes  Recommendations for Outpatient Follow-up:  CBC to follow Hgb level -BMET to follow electrolytes and renal function -reassess BP and adjust medications as needed -patient to have resumption of coumadin for secondary prevention given atrial fibrillation and elevated CHADS score -cardiology service to follow for event monitor and to adjust anti a arrhythmics as needed  Discharge Diagnoses:  Principal Problem:   Junctional bradycardia Active Problems:   CKD (chronic kidney disease) stage V requiring chronic dialysis   DM2 (diabetes mellitus, type 2)   Pulmonary edema   COPD (chronic obstructive pulmonary disease)   HTN (hypertension)   Atrial fibrillation   Pre-syncope   Anemia in chronic kidney disease   Chest pain   Discharge Condition: stable and improved. Will discharge home. Continue HD and follow with cardiology for event monitoring, antiarrhythmic dose adjustment and resumption of coumadin.    Diet recommendation: low sodium diet  Filed Weights   08/29/13 1817 08/30/13 1501 08/31/13 0602  Weight: 78.6 kg (173 lb 4.5 oz) 78.7 kg (173 lb 8 oz) 79.1 kg (174 lb 6.1 oz)    History of present illness:  72 y.o. female with multiple medical problems including ESRD on Dialysis and Atrial fibrillation who presents to the ED with complaints of chest pain And weakness and feeling as if she would pass out. She reports having increased DOE. She was evaluated in the ED and was found to have a junctional rhythm with pauses for close to 10 seconds. Cardiology was consulted by the EDP and she was referred for medical admission.    Hospital Course:  Junctional Bradycardia / presyncope  was on Coreg, amiodarone, and diltiazem at home  -plans is to d/c diltiazem and coreg -continue amiodarone at 1/2  dose -resume coumadin as an outpatient -plan is for event monitor in outpatient setting   Chest pain  -Symptoms not suggestive of acute coronary syndrome -follow up with cardiology at discharge  Chronic parox atrial fibrillation  -On chronic coumadin previously but had GIB April 2014 at which time was stopped  -CHADSVAS score 4-5  -recommendations is to continue amiodarone and resume coumadin as an outpatient   Pulmonary HTN  -On chronic home O2   Acute on chronic diastolic CHF  -apical hypertrophic cardiomyopathy has been r/o by cardiac MRI -continue low sodium diet and volume control with HD -continue rhythm controlled with amiodarone.   ESRD on HD  -On MWF HD  -Electrolytes and volume adjustment to continue to be manage by renal service at discharge.  HTN  -Follow blood pressure post dialysis   Anemia in chronic kidney disease  -per enal service -received aranesp and IV iron while inpatient -Hgb at discharge above 9  Sleep apnea  -continue CPAP  DM2  -Stable and well controlled. Continue glipizide  PVD s/p B CEA 2012  -continue coumadin  MRSA screen +  -patient received decolonization process and was placed in contact precautions    Hx of renal cell CA s/p R nephrectomy 2005   Procedures: See below for x-ray reports  Consultations:  Renal service  Cardiology (electrophysiology)     Discharge Exam: Filed Vitals:   08/31/13 0602  BP: 153/51  Pulse: 76  Temp: 97.7 F (36.5 C)  Resp: 20   General: Alert and awake, oriented x3, in not any acute distress. Afebrile  CVS: S1-S2 clear,  no murmur rubs or gallops  Chest: mild bibasal crackles  Abdomen: soft nontender, nondistended, normal bowel sounds  Extremities: no cyanosis, clubbing or edema noted bilaterally  Neuro: Cranial nerves II-XII intact, no focal neurological deficits    Discharge Instructions  Discharge Orders   Future Appointments Provider Department Dept Phone   09/07/2013 11:00  AM [facility] [address] [phone]   09/07/2013 12:00 PM Carma Lair Nickel, NP [facility] [phone]   Future Orders Complete By Expires   Discharge instructions  As directed    Comments:     Take medications as prescribed Follow a low sodium renal diet Arrange follow up with cardiology in 1 week Follow with PCP in 10 days       Medication List    STOP taking these medications       carvedilol 25 MG tablet  Commonly known as:  COREG     diltiazem 240 MG 24 hr capsule  Commonly known as:  CARTIA XT      TAKE these medications       albuterol 108 (90 BASE) MCG/ACT inhaler  Commonly known as:  PROVENTIL HFA;VENTOLIN HFA  Inhale 1-2 puffs into the lungs 2 (two) times daily as needed for wheezing or shortness of breath.     amiodarone 200 MG tablet  Commonly known as:  PACERONE  Take 1 tablet (200 mg total) by mouth daily.     amLODipine 10 MG tablet  Commonly known as:  NORVASC  Take 1 tablet (10 mg total) by mouth daily.     calcium acetate 667 MG capsule  Commonly known as:  PHOSLO  Take 2,001 mg by mouth 3 (three) times daily with meals.     ezetimibe 10 MG tablet  Commonly known as:  ZETIA  Take 10 mg by mouth at bedtime.     fluticasone 110 MCG/ACT inhaler  Commonly known as:  FLOVENT HFA  Inhale 2 puffs into the lungs 3 (three) times daily as needed (for wheezing and shortness of breath).     glipiZIDE 5 MG tablet  Commonly known as:  GLUCOTROL  Take 5 mg by mouth daily as needed (if CBG is >150).     ipratropium 0.02 % nebulizer solution  Commonly known as:  ATROVENT  Take 2.5 mLs (500 mcg total) by nebulization every 6 (six) hours as needed for wheezing.     multivitamin Tabs tablet  Take 1 tablet by mouth daily.     rOPINIRole 1 MG tablet  Commonly known as:  REQUIP  Take 1 mg by mouth 2 (two) times daily.       Allergies  Allergen Reactions  . Morphine And Related Nausea And  Vomiting  . Lipitor [Atorvastatin] Other (See Comments)    Causes weakness and drowsiness  . Nsaids     Unknown reported by previous hospital       Follow-up Information   Follow up with GRISSO,GREG, MD. Schedule an appointment as soon as possible for a visit in 10 days.   Specialty:  Internal Medicine   Contact information:   [address] [place] [address] [phone]        The results of significant diagnostics from this hospitalization (including imaging, microbiology, ancillary and laboratory) are listed below for reference.    Significant Diagnostic Studies: Dg Chest Portable 1 View  08/28/2013   CLINICAL DATA:  Chest pain.  EXAM: PORTABLE CHEST - 1 VIEW  COMPARISON:  06/24/2013 and prior chest radiographs  FINDINGS: Cardiomegaly and mild pulmonary vascular congestion noted.  A defibrillator pad overlying the chest is present.  Mild left basilar atelectasis noted.  There is no evidence of focal airspace disease, pulmonary edema, suspicious pulmonary nodule/mass, pleural effusion, or pneumothorax. No acute bony abnormalities are identified.  IMPRESSION: Cardiomegaly with mild pulmonary vascular congestion.   Electronically Signed   By: Laveda Abbe M.D.   On: 08/28/2013 00:00   Mr Card Morphology W/o Cm  08/31/2013   CLINICAL DATA:  Assess for apical hypertrophic cardiomyopathy.  EXAM: CARDIAC MRI  TECHNIQUE: The patient was scanned on a 1.5 Tesla GE magnet. A dedicated cardiac coil was used. Functional imaging was done using Fiesta sequences. 2,3, and 4 chamber views were done to assess for RWMA's. Modified Simpson's rule using a short axis stack was used to calculate an ejection fraction on a dedicated work [employer]. No contrast was given due to ESRD.  CONTRAST:  None  FINDINGS: Limited views of the lung fields showed no gross abnormalities. Normal left ventricular size with mild focal basal septal hypertrophy. Otherwise, wall thickness was normal. There  was no apical hypertrophy. Vigorous global systolic function, EF 72%. Normal wall motion. Normal right ventricular size and systolic function. Moderate left atrial enlargement. Mild right atrial enlargement. The aortic valve was trileaflet with mild thickening. No significant aortic stenosis or regurgitation. No more than mild mitral regurgitation.  Contrast was not given due to ESRD.  IMPRESSION: 1. Normal LV size with vigorous systolic function, EF 72%. There was very mild focal basal septal hypertrophy.  2. No evidence for apical hypertrophic cardiomyopathy (or other variants of hypertrophic cardiomyopathy).  Dalton Mclean   Electronically Signed   By: Marca Ancona M.D.   On: 08/31/2013 08:33    Microbiology: Recent Results (from the past 240 hour(s))  MRSA PCR SCREENING     Status: Abnormal   Collection Time    08/28/13  2:01 AM      Result Value Range Status   MRSA by PCR POSITIVE (*) NEGATIVE Final   Comment:            The GeneXpert MRSA Assay (FDA     approved for NASAL specimens     only), is one component of a     comprehensive MRSA colonization     surveillance program. It is not     intended to diagnose MRSA     infection nor to guide or     monitor treatment for     MRSA infections.     RESULT CALLED TO, READ BACK BY AND VERIFIED WITH:     CALLED TO RN [facility] WHITE [id number] @[id number]  THANEY  MRSA PCR SCREENING     Status: Abnormal   Collection Time    08/28/13  2:00 PM      Result Value Range Status   MRSA by PCR POSITIVE (*) NEGATIVE Final   Comment:            The GeneXpert MRSA Assay (FDA     approved for NASAL specimens     only), is one component of a     comprehensive MRSA colonization     surveillance program. It is not     intended to diagnose MRSA     infection nor to guide or     monitor treatment for     MRSA infections.     RESULT CALLED TO, READ BACK BY AND VERIFIED  WITH:     [facility] 2039 08/28/13 M.CAMPBELL     Labs: Basic Metabolic  Panel:  Recent Labs Lab 08/27/13 2219 08/27/13 2250 08/28/13 0515 08/31/13 0700  NA 139  --  136 137  K 4.2  --  4.5 4.8  CL 95*  --  92* 96  CO2 33*  --  24 24  GLUCOSE 115*  --  190* 88  BUN 41*  --  44* 72*  CREATININE 5.47*  --  5.95* 7.09*  CALCIUM 9.1  --  9.0 8.8  MG  --  2.3  --   --    Liver Function Tests:  Recent Labs Lab 08/29/13 0503  AST 15  ALT 8  ALKPHOS 95  BILITOT 0.3  PROT 6.3  ALBUMIN 3.0*   CBC:  Recent Labs Lab 08/27/13 2219 08/28/13 0515 08/30/13 0415  WBC 15.6* 13.3* 15.1*  HGB 9.8* 8.5* 9.2*  HCT 33.3* 28.3* 31.5*  MCV 109.9* 108.0* 109.8*  PLT 245 216 223   Cardiac Enzymes:  Recent Labs Lab 08/28/13 0001 08/28/13 0515 08/28/13 1238  TROPONINI <0.30 <0.30 <0.30   BNP: BNP (last 3 results)  Recent Labs  10/19/12 1605 04/13/13 1644 08/27/13 2220  PROBNP 17356.0* 12692.0* 14565.0*   CBG:  Recent Labs Lab 08/30/13 1206 08/30/13 1623 08/30/13 2117 08/31/13 0647 08/31/13 1110  GLUCAP 91 106* 80 97 71    Signed:  Melanie Openshaw  [facility] 08/31/2013, 1:33 PM

## 2013-08-31 NOTE — Progress Notes (Signed)
ELECTROPHYSIOLOGY ROUNDING NOTE    Patient Name: [name] Date of Encounter: 08/31/2013    SUBJECTIVE:Patient feels well today.  No chest pain, shortness of breath, or palpitations.  Does complain of restless leg syndrome.  She is anticipating HD today.   MRI done - no evidence for HCM   TELEMETRY: Reviewed telemetry pt in sinus rhythm Filed Vitals:   08/30/13 1206 08/30/13 1501 08/30/13 2115 08/31/13 0602  BP: 153/47 145/46 157/55 153/51  Pulse:  74 73 76  Temp: 97.9 F (36.6 C) 97.5 F (36.4 C) 97.9 F (36.6 C) 97.7 F (36.5 C)  TempSrc: Oral Oral Oral Oral  Resp: 21 19 19 20   Height:  5\' 4"  (1.626 m)    Weight:  173 lb 8 oz (78.7 kg)  174 lb 6.1 oz (79.1 kg)  SpO2: 98% 99% 96% 99%    Intake/Output Summary (Last 24 hours) at 08/31/13 0856 Last data filed at 08/31/13 1610  Gross per 24 hour  Intake    600 ml  Output    675 ml  Net    -75 ml    CURRENT MEDICATIONS: . amiodarone  200 mg Oral Daily  . amLODipine  5 mg Oral Daily  . antiseptic oral rinse  15 mL Mouth Rinse BID  . calcium acetate  1,334 mg Oral TID WC  . Chlorhexidine Gluconate Cloth  6 each Topical Q0600  . darbepoetin  60 mcg Intravenous Q Mon-HD  . enoxaparin (LOVENOX) injection  30 mg Subcutaneous Q24H  . ezetimibe  10 mg Oral QHS  . ferric gluconate (FERRLECIT/NULECIT) IV  125 mg Intravenous Q M,W,F-HD  . hydrocortisone   Rectal BID  . insulin aspart  0-5 Units Subcutaneous QHS  . insulin aspart  0-9 Units Subcutaneous TID WC  . multivitamin  1 tablet Oral QHS  . mupirocin ointment  1 application Nasal BID  . rOPINIRole  1 mg Oral BID    LABS: Basic Metabolic Panel:  Recent Labs  96/04/54 0700  NA 137  K 4.8  CL 96  CO2 24  GLUCOSE 88  BUN 72*  CREATININE 7.09*  CALCIUM 8.8   Liver Function Tests:  Recent Labs  08/29/13 0503  AST 15  ALT 8  ALKPHOS 95  BILITOT 0.3  PROT 6.3  ALBUMIN 3.0*   CBC:  Recent Labs  08/30/13 0415  WBC 15.1*  HGB 9.2*  HCT 31.5*    MCV 109.8*  PLT 223   Cardiac Enzymes:  Recent Labs  08/28/13 1238  TROPONINI <0.30   Thyroid Function Tests:  Recent Labs  08/28/13 1620  TSH 0.851    Radiology/Studies:  Dg Chest Portable 1 View 08/28/2013   CLINICAL DATA:  Chest pain.  EXAM: PORTABLE CHEST - 1 VIEW  COMPARISON:  06/24/2013 and prior chest radiographs  FINDINGS: Cardiomegaly and mild pulmonary vascular congestion noted.  A defibrillator pad overlying the chest is present.  Mild left basilar atelectasis noted.  There is no evidence of focal airspace disease, pulmonary edema, suspicious pulmonary nodule/mass, pleural effusion, or pneumothorax. No acute bony abnormalities are identified.  IMPRESSION: Cardiomegaly with mild pulmonary vascular congestion.   Electronically Signed   By: Laveda Abbe M.D.   On: 08/28/2013 00:00   Mr Card Morphology W/o Cm 08/31/2013   CLINICAL DATA:  Assess for apical hypertrophic cardiomyopathy.  EXAM: CARDIAC MRI  TECHNIQUE: The patient was scanned on a 1.5 Tesla GE magnet. A dedicated cardiac coil was used. Functional imaging was done  using Fiesta sequences. 2,3, and 4 chamber views were done to assess for RWMA's. Modified Simpson's rule using a short axis stack was used to calculate an ejection fraction on a dedicated work [employer]. No contrast was given due to ESRD.  CONTRAST:  None  FINDINGS: Limited views of the lung fields showed no gross abnormalities. Normal left ventricular size with mild focal basal septal hypertrophy. Otherwise, wall thickness was normal. There was no apical hypertrophy. Vigorous global systolic function, EF 72%. Normal wall motion. Normal right ventricular size and systolic function. Moderate left atrial enlargement. Mild right atrial enlargement. The aortic valve was trileaflet with mild thickening. No significant aortic stenosis or regurgitation. No more than mild mitral regurgitation.  Contrast was not given due to ESRD.  IMPRESSION: 1. Normal LV  size with vigorous systolic function, EF 72%. There was very mild focal basal septal hypertrophy.  2. No evidence for apical hypertrophic cardiomyopathy (or other variants of hypertrophic cardiomyopathy).  Dalton Mclean   Electronically Signed   By: Marca Ancona M.D.   On: 08/31/2013 08:33    PHYSICAL EXAM Well developed and nourished in no acute distress HENT normal Neck supple with JVP-flat Clear Regular rate and rhythm, no murmurs or gallops Abd-soft with active BS No Clubbing cyanosis edema Skin-warm and dry A & Oriented  Grossly normal sensory and motor function      Principal Problem:   Junctional bradycardia Active Problems:   CKD (chronic kidney disease) stage V requiring chronic dialysis   DM2 (diabetes mellitus, type 2)   Pulmonary edema   COPD (chronic obstructive pulmonary disease)   HTN (hypertension)   Atrial fibrillation   Pre-syncope   Anemia in chronic kidney disease   Chest pain   Increase amlodipine for BP  Otherwise per renal Ok to discharge on amiodarone and coumadin No DILT or BB Would get 30 day event recorder from Primary cardiology to look for recurreent bradycardia

## 2013-08-31 NOTE — Progress Notes (Signed)
[facility] Progress Note    Subjective: No complaints, breathing better after HD yest. Hb up over 9 today   Exam  Blood pressure 153/51, pulse 76, temperature 97.7 F (36.5 C), temperature source Oral, resp. rate 20, height 5\' 4"  (1.626 m), weight 79.1 kg (174 lb 6.1 oz), SpO2 99.00%. gen: alert, elderly female in no distress  neck: + jvd, no LAN  chest: clear bilat cor: regular, rusb 2/6 M, no rub or gallop  abd: soft, nontender, nd, no ascites or hsm  ext: no leg or UE edema, no joint effusion, no gangrene/ulcers  neuro: alert, ox3, nonfocal   Outpt HD: [place] MWF  4hrs   F160    78kg    2/2.25 bath    400/A1.5    Heparin none    RUA AVF  Hectorol- none    EPO 10000    Venofer 100mg /hd (11/26 > 12/6)  Recent labs: tsat 23% pth 129 phos 5.5   Assessment:  1. Bradycardia / presyncope- improved, holding BB and CCB, still on po amio 2. Dyspnea: better after HD yest 3. ESRD MWF: HD today 4. Afib: on amio, per cardiology / primary  5. HTN/volume: right at dry weight 6. 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 Anemia of ckd: cont epo, IV fe load. She does not do well with Hb below 9 or so, but today Hb up so will not transfuse.  8. COPD: on albuterol nebs, O2  9. Dispo: prob d/c today   Vinson Moselle MD  pager [phone]    cell [phone]  08/31/2013, 12:14 PM   Recent Labs Lab 08/27/13 2219 08/28/13 0515 08/31/13 0700  NA 139 136 137  K 4.2 4.5 4.8  CL 95* 92* 96  CO2 33* 24 24  GLUCOSE 115* 190* 88  BUN 41* 44* 72*  CREATININE 5.47* 5.95* 7.09*  CALCIUM 9.1 9.0 8.8    Recent Labs Lab 08/29/13 0503  AST 15  ALT 8  ALKPHOS 95  BILITOT 0.3  PROT 6.3  ALBUMIN 3.0*    Recent Labs Lab 08/27/13 2219 08/28/13 0515 08/30/13 0415  WBC 15.6* 13.3* 15.1*  HGB 9.8* 8.5* 9.2*  HCT 33.3* 28.3* 31.5*  MCV 109.9* 108.0* 109.8*  PLT 245 216 223   . amiodarone  200 mg Oral Daily  . amLODipine  5 mg Oral  Daily  . antiseptic oral rinse  15 mL Mouth Rinse BID  . calcium acetate  1,334 mg Oral TID WC  . Chlorhexidine Gluconate Cloth  6 each Topical Q0600  . darbepoetin  60 mcg Intravenous Q Mon-HD  . enoxaparin (LOVENOX) injection  30 mg Subcutaneous Q24H  . ezetimibe  10 mg Oral QHS  . ferric gluconate (FERRLECIT/NULECIT) IV  125 mg Intravenous Q M,W,F-HD  . hydrocortisone   Rectal BID  . insulin aspart  0-5 Units Subcutaneous QHS  . insulin aspart  0-9 Units Subcutaneous TID WC  . multivitamin  1 tablet Oral QHS  . mupirocin ointment  1 application Nasal BID  . rOPINIRole  1 mg Oral BID     acetaminophen, acetaminophen, albuterol, alum & mag hydroxide-simeth, atropine, calcium acetate, fluticasone, HYDROmorphone (DILAUDID) injection, ipratropium, ondansetron (ZOFRAN) IV, ondansetron, oxyCODONE, zolpidem         gen: alert, elderly female in no distress  skin: no rash, cyanosis  heent: eomi, sclera anicteric, throat clear  neck: + jvd, no LAN  chest: clear bilat, moderately reduced air  movement throughout, no wheezing  cor: regular, rusb 2/6 M, no rub or gallop  abd: soft, nontender, nd, no ascites or hsm  ext: no leg or UE edema, no joint effusion, no gangrene/ulcers  neuro: alert, ox3, nonfocal  Outpt HD: [place] MWF  4hrs F160 78kg 2/2.25 bath 400/A1.5 Heparin none RUA AVF  Hectorol- none EPO 10000 Venofer 100mg /hd (11/26 > 12/6)  Recent labs: tsat 23% pth 129 phos 5.5  Assessment:  10. Bradycardia / presyncope- improved, holding BB and CCB 11. Dyspnea: underlying copd on home O2, possible vol excess, plan HD w vol removal today 12. ESRD: HD tdoay 13. Afib: in nsr, po amio, per cardiology 14. HTN/volume: per cardiology coreg and dilt on hold, lower vol w hd today 15. 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 Anemia of ckd: cont epo, IV fe load 17. COPD: on albuterol nebs, O2 [place] 18.

## 2013-09-06 ENCOUNTER — Encounter: Payer: Self-pay | Admitting: Family

## 2013-09-07 ENCOUNTER — Other Ambulatory Visit (HOSPITAL_COMMUNITY): Payer: Medicare Other

## 2013-09-07 ENCOUNTER — Ambulatory Visit: Payer: Medicare Other | Admitting: Family

## 2013-09-26 ENCOUNTER — Encounter: Payer: Self-pay | Admitting: Family

## 2013-09-27 ENCOUNTER — Ambulatory Visit (INDEPENDENT_AMBULATORY_CARE_PROVIDER_SITE_OTHER): Payer: Medicare Other | Admitting: Family

## 2013-09-27 ENCOUNTER — Encounter: Payer: Self-pay | Admitting: Family

## 2013-09-27 ENCOUNTER — Ambulatory Visit (HOSPITAL_COMMUNITY)
Admission: RE | Admit: 2013-09-27 | Discharge: 2013-09-27 | Disposition: A | Discharge status: Home / Self Care | Payer: Medicare Other | Source: Ambulatory Visit | Attending: Family | Admitting: Family

## 2013-09-27 DIAGNOSIS — I6529 Occlusion and stenosis of unspecified carotid artery: Secondary | ICD-10-CM

## 2013-09-27 DIAGNOSIS — Z48812 Encounter for surgical aftercare following surgery on the circulatory system: Secondary | ICD-10-CM | POA: Insufficient documentation

## 2013-09-27 DIAGNOSIS — M79609 Pain in unspecified limb: Secondary | ICD-10-CM | POA: Insufficient documentation

## 2013-09-27 NOTE — Progress Notes (Signed)
Established Carotid Patient   History of Present Illness  [name] is a 72 y.o. female patient of Dr. Hart Rochester status post bilateral CEAs in early 2011.   Patient has Negative history of TIA or stroke symptom.  The patient denies amaurosis fugax or monocular blindness.  The patient  denies facial drooping.  Pt. denies hemiplegia.  The patient   receptive or expressive aphasia.  Pt. Reports generalized weakness, wearing 3L O2/min. Her right upper arm is her HD access. Dyspnea and fatigue limit her walking.   Patient denies New Medical or Surgical History.  Pt Diabetic: Yes, states in good control, rarely needs po DM medication Pt smoker: former smoker, quit in 2005  Pt meds include: Statin : Yes Betablocker: Yes ASA: Yes, 81 mg daily Other anticoagulants/antiplatelets: states her cardiologist took her off coumadin, unclear from pt. As to reason, she is taking amiodarone for atrial fib.   Past Medical History  Diagnosis Date  . Carotid artery occlusion     bilat CEA in 2012  . C. 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 Hypertension   . Atrial fibrillation April  2013  . Pulmonary hypertension   . Secondary hyperparathyroidism, renal   . COPD (chronic obstructive pulmonary disease)   . Hypercholesterolemia   . History of MI (myocardial infarction)     "they say I had 2 years ago" (08/20/2012)  . GERD (gastroesophageal reflux disease)   . History of pneumonia     "have it alot" (08/20/2012)  . Sleep apnea     "don't wear mask anymore"  . Type II diabetes mellitus   . History of blood transfusion 2013    "3 times so far in 2013:  4 pints one time; 2 pints another; ?# last time" (08/20/2012)  . Iron deficiency anemia   . Seizures 2012    after carotid surgery  . ESRD on dialysis     "[place]; Tues, Thurs; Sat"  . Renal cell carcinoma 2005?  Marland Kitchen On home oxygen therapy     "24/7"   . Acute diverticulitis     Oct 2013, treated medically [facility], sigmoid diverticulitits  . CHF (congestive heart failure)     Social History History  Substance Use Topics  . Smoking status: Former Smoker -- 1.50 packs/day for 45 years    Types: Cigarettes    Quit date: 09/30/2003  . Smokeless tobacco: Never Used  . Alcohol Use: No    Family History Family History  Problem Relation Age of Onset  . Cancer Mother     pancreatic  . Stroke Father   . Coronary artery disease Father   . Cancer Brother     Brain  . Kidney disease Brother     Kidney stones    Surgical History Past Surgical History  Procedure Laterality Date  . Nephrectomy  2005    Right, for Renal cell carcinoma  . Carotid endarterectomy  2012    bilaterally; Dr. Hart Rochester  . Av fistula placement  Jan. 7, 2011    Right  upper arm by Dr. Hart Rochester  . Hd catheter  Aug. 22, 2013    [facility]  . Appendectomy      childhood  . Abdominal hysterectomy  1971?  Marland Kitchen Coronary angioplasty with stent placement  1990's    "1 total"  . Coronary angioplasty  1990's  . Av fistula repair  2013    right upper arm  . Esophagogastroduodenoscopy  N/A 12/29/2012    Procedure: ESOPHAGOGASTRODUODENOSCOPY (EGD);  Surgeon: Petra Kuba, MD;  Location: [facility] ENDOSCOPY;  Service: Endoscopy;  Laterality: N/A;  . Colonoscopy with propofol N/A 12/31/2012    Procedure: COLONOSCOPY WITH PROPOFOL;  Surgeon: Petra Kuba, MD;  Location: [facility] ENDOSCOPY;  Service: Endoscopy;  Laterality: N/A;  currently IP at [facility] [address]    Allergies  Allergen Reactions  . Morphine And Related Nausea And Vomiting  . Lipitor [Atorvastatin] Other (See Comments)    Causes weakness and drowsiness  . Nsaids     Unknown reported by previous hospital    Current Outpatient Prescriptions  Medication Sig Dispense Refill  . albuterol (PROVENTIL HFA;VENTOLIN HFA) 108 (90 BASE) MCG/ACT inhaler Inhale 1-2 puffs into the lungs 2 (two) times daily as needed for wheezing or shortness of breath.       Marland Kitchen amiodarone (PACERONE) 200 MG  tablet Take 1 tablet (200 mg total) by mouth daily.  30 tablet  1  . amLODipine (NORVASC) 10 MG tablet Take 1 tablet (10 mg total) by mouth daily.  30 tablet  1  . calcium acetate (PHOSLO) 667 MG capsule Take 2,001 mg by mouth 3 (three) times daily with meals.      Marland Kitchen ezetimibe (ZETIA) 10 MG tablet Take 10 mg by mouth at bedtime.       . fluticasone (FLOVENT HFA) 110 MCG/ACT inhaler Inhale 2 puffs into the lungs 3 (three) times daily as needed (for wheezing and shortness of breath).      Marland Kitchen glipiZIDE (GLUCOTROL) 5 MG tablet Take 5 mg by mouth daily as needed (if CBG is >150).      Marland Kitchen ipratropium (ATROVENT) 0.02 % nebulizer solution Take 2.5 mLs (500 mcg total) by nebulization every 6 (six) hours as needed for wheezing.  75 mL  2  . multivitamin (RENA-VIT) TABS tablet Take 1 tablet by mouth daily.      Marland Kitchen rOPINIRole (REQUIP) 1 MG tablet Take 1 mg by mouth 2 (two) times daily.       No current facility-administered medications for this visit.    Review of Systems : See HPI for pertinent positives and negatives.  Physical Examination  Filed Vitals:   09/27/13 1359  BP: 101/58  Pulse: 87  Resp: 16    Filed Weights   09/27/13 1359  Weight: 171 lb (77.565 kg)   Body mass index is 29.34 kg/(m^2).  General: WDWN female in NAD GAIT: slow, O2 tank on rollers Eyes: PERRLA Pulmonary:  BBS distant, no Rales, Negative rhonchi, & Negative wheezing.  Cardiac: regular Rhythm ,  Positive Murmurs.  VASCULAR EXAM Carotid Bruits Left Right   positive Transmitted cardiac murmur   Radial pulses are 2+ palpable and equal.                                                                                                                            LE Pulses LEFT RIGHT  POPLITEAL  not palpable   not palpable       POSTERIOR TIBIAL   palpable   not palpable        DORSALIS PEDIS      ANTERIOR TIBIAL  palpable   palpable     Gastrointestinal: soft, nontender, BS WNL, no r/g,  negative  masses.  Musculoskeletal: Negative muscle atrophy/wasting. M/S 5/5 throughout, Extremities without ischemic changes, no edema in LE's.  Neurologic: A&O X 3; Appropriate Affect ; SENSATION ;normal;  Speech is normal CN 2-12 intact, Pain and light touch intact in extremities, Motor exam as listed above.   Non-Invasive Vascular Imaging CAROTID DUPLEX 09/27/2013   Right ICA: <40% stenosis. Left ICA: 40 - 59 % stenosis. Diffuse plaque throughout bilateral CCA's.  These findings indicate increased velocity in the left ICA from previous exam a year ago.  Assessment: SHANAYE RIEF is a 72 y.o. female who presents with s/p bilateral CEA's in 2011, is asymptomatic, right ICA has minimal plaque, left ICA seems to have more plaque than a year ago, but still in the 40-59% stenosis range. The  Left ICA stenosis is slightly  Worse from previous exam a year ago, right ICA stenosis is stable and minimal.  Plan: Follow-up in 1 year with Carotid Duplex scan.   I discussed in depth with the patient the nature of atherosclerosis, and emphasized the importance of maximal medical management including strict control of blood pressure, blood glucose, and lipid levels, obtaining regular exercise, and continued cessation of smoking.  The patient is aware that without maximal medical management the underlying atherosclerotic disease process will progress, limiting the benefit of any interventions. The patient was given information about stroke prevention and what symptoms should prompt the patient to seek immediate medical care. Thank you for allowing [place] to participate in this patient's care.  Charisse March, RN, MSN, FNP-C [facility] Office: [phone]  Clinic Physician: Hart Rochester  09/27/2013 1:48 PM

## 2013-09-27 NOTE — Patient Instructions (Signed)
Stroke Prevention Some medical conditions and behaviors are associated with an increased chance of having a stroke. You may prevent a stroke by making healthy choices and managing medical conditions. Reduce your risk of having a stroke by:  Staying physically active. Get at least 30 minutes of activity on most or all days.  Not smoking. It may also be helpful to avoid exposure to secondhand smoke.  Limiting alcohol use. Moderate alcohol use is considered to be:  No more than 2 drinks per day for men.  No more than 1 drink per day for nonpregnant women.  Eating healthy foods.  Include 5 or more servings of fruits and vegetables a day.  Certain diets may be prescribed to address high blood pressure, high cholesterol, diabetes, or obesity.  Managing your cholesterol levels.  A low-saturated fat, low-trans fat, low-cholesterol, and high-fiber diet may control cholesterol levels.  Take any prescribed medicines to control cholesterol as directed by your caregiver.  Managing your diabetes.  A controlled-carbohydrate, controlled-sugar diet is recommended to manage diabetes.  Take any prescribed medicines to control diabetes as directed by your caregiver.  Controlling your high blood pressure (hypertension).  A low-salt (sodium), low-saturated fat, low-trans fat, and low-cholesterol diet is recommended to manage high blood pressure.  Take any prescribed medicines to control hypertension as directed by your caregiver.  Maintaining a healthy weight.  A reduced-calorie, low-sodium, low-saturated fat, low-trans fat, low-cholesterol diet is recommended to manage weight.  Stopping drug abuse.  Avoiding birth control pills.  Talk to your caregiver about the risks of taking birth control pills if you are over 35 years old, smoke, get migraines, or have ever had a blood clot.  Getting evaluated for sleep disorders (sleep apnea).  Talk to your caregiver about getting a sleep evaluation  if you snore a lot or have excessive sleepiness.  Taking medicines as directed by your caregiver.  For some people, aspirin or blood thinners (anticoagulants) are helpful in reducing the risk of forming abnormal blood clots that can lead to stroke. If you have the irregular heart rhythm of atrial fibrillation, you should be on a blood thinner unless there is a good reason you cannot take them.  Understand all your medicine instructions. SEEK IMMEDIATE MEDICAL CARE IF:   You have sudden weakness or numbness of the face, arm, or leg, especially on one side of the body.  You have sudden confusion.  You have trouble speaking (aphasia) or understanding.  You have sudden trouble seeing in one or both eyes.  You have sudden trouble walking.  You have dizziness.  You have a loss of balance or coordination.  You have a sudden, severe headache with no known cause.  You have new chest pain or an irregular heartbeat. Any of these symptoms may represent a serious problem that is an emergency. Do not wait to see if the symptoms will go away. Get medical help right away. Call your local emergency services (911 in [place] Do not drive yourself to the hospital. Document Released: 10/23/2004 Document Revised: 12/08/2011 Document Reviewed: 03/18/2013 Exit[employer] Patient Information 2014 [employer].  

## 2013-10-11 ENCOUNTER — Encounter: Payer: Self-pay | Admitting: Internal Medicine

## 2013-10-11 ENCOUNTER — Ambulatory Visit (INDEPENDENT_AMBULATORY_CARE_PROVIDER_SITE_OTHER): Payer: Medicare Other | Admitting: Internal Medicine

## 2013-10-11 ENCOUNTER — Ambulatory Visit (INDEPENDENT_AMBULATORY_CARE_PROVIDER_SITE_OTHER)
Admission: RE | Admit: 2013-10-11 | Discharge: 2013-10-11 | Disposition: A | Discharge status: Home / Self Care | Payer: Medicare Other | Source: Ambulatory Visit | Attending: Internal Medicine | Admitting: Internal Medicine

## 2013-10-11 VITALS — BP 130/78 | HR 90 | Temp 98.0°F | Ht 64.0 in | Wt 178.0 lb

## 2013-10-11 DIAGNOSIS — R0989 Other specified symptoms and signs involving the circulatory and respiratory systems: Secondary | ICD-10-CM

## 2013-10-11 DIAGNOSIS — R0609 Other forms of dyspnea: Secondary | ICD-10-CM

## 2013-10-11 DIAGNOSIS — R0600 Dyspnea, unspecified: Secondary | ICD-10-CM

## 2013-10-11 DIAGNOSIS — J9611 Chronic respiratory failure with hypoxia: Secondary | ICD-10-CM

## 2013-10-11 DIAGNOSIS — J9610 Chronic respiratory failure, unspecified whether with hypoxia or hypercapnia: Secondary | ICD-10-CM

## 2013-10-11 DIAGNOSIS — R0902 Hypoxemia: Secondary | ICD-10-CM

## 2013-10-11 DIAGNOSIS — J449 Chronic obstructive pulmonary disease, unspecified: Secondary | ICD-10-CM

## 2013-10-11 MED ORDER — ACLIDINIUM BROMIDE 400 MCG/ACT IN AEPB: 1.0000 | INHALATION_SPRAY | Freq: Two times a day (BID) | RESPIRATORY_TRACT | Status: DC

## 2013-10-11 NOTE — Assessment & Plan Note (Addendum)
DDX of  difficult airways managment all start with A and  include Adherence, Ace Inhibitors, Acid Reflux, Active Sinus Disease, Alpha 1 Antitripsin deficiency, Anxiety masquerading as Airways dz,  ABPA,  allergy(esp in young), Aspiration (esp in elderly), Adverse effects of DPI,  Active smokers, plus two Bs  = Bronchiectasis and Beta blocker use..and one C= CHF  Adherence is always the initial "prime suspect" and is a multilayered concern that requires a "trust but verify" approach in every patient - starting with knowing how to use medications, especially inhalers, correctly, keeping up with refills and understanding the fundamental difference between maintenance and prns vs those medications only taken for a very short course and then stopped and not refilled.  The proper method of use, as well as anticipated side effects, of a metered-dose inhaler are discussed and demonstrated to the patient. Improved effectiveness after extensive coaching during this visit to a level of approximately  90% so start tudorza one bid and see if improves doe and reduces combivent, then change to saba next ov   ? [place]/ asthma > strongly doubt so ok to d/c flovent    Each maintenance medication was reviewed in detail including most importantly the difference between maintenance and as needed and under what circumstances the prns are to be used.  Please see instructions for details which were reviewed in writing and the patient given a copy.

## 2013-10-11 NOTE — Progress Notes (Signed)
Quick Note:  Spoke with pt and notified of results per Dr. Wert. Pt verbalized understanding and denied any questions.  ______ 

## 2013-10-11 NOTE — Assessment & Plan Note (Signed)
10/11/2013   Walked RA x one lap @ 185 stopped due to  desat to 77% from baseline resting sats of 94% and ok sats on 3lpm x one lap  Concerned re use of amiodarone but by her hx she was 02 dep long before the amiodarone and no recent change in severity of resp failure so for now rx = 3lpm at hs and walking more than room to room

## 2013-10-11 NOTE — Progress Notes (Signed)
   Subjective:    Patient ID: [name], female    DOB: 13-Dec-1940  MRN: [id number]  HPI  61 yowf quit smoking 2005 some doe (heavy exertion) at wt 175 and gradually downhill since despite eval by Chodri and rx with inhalers and 02 x around 2009 referred to pulmonary clinic  10/11/2013 by Dr Bea Graff.  10/11/2013 1st [facility] office visit/ Eniola Cerullo cc indolent onset progressive doe x 10 y x one half aisle at grocery store on 02 3lpm and usually goes mb and back  s 02 and then sits down and uses combivent and feels better but never tries to rechallenge herself p combivent.  Never has resting sob.   maint on flovent/ combivent   No change in doe Sunday vs Tuesday p hd on mondays  No obvious day to day or daytime variabilty or assoc chronic cough or cp or chest tightness, subjective wheeze overt sinus or hb symptoms. No unusual exp hx or h/o childhood pna/ asthma or knowledge of premature birth.  Sleeping ok on 02 without nocturnal  or early am exacerbation  of respiratory  c/o's or need for noct saba. Also denies any obvious fluctuation of symptoms with weather or environmental changes or other aggravating or alleviating factors except as outlined above   Current Medications, Allergies, Complete Past Medical History, Past Surgical History, Family History, and Social History were reviewed in [employer] record.            Review of Systems  Constitutional: Negative for fever, chills and unexpected weight change.  HENT: Negative for congestion, dental problem, ear pain, nosebleeds, postnasal drip, rhinorrhea, sinus pressure, sneezing, sore throat, trouble swallowing and voice change.   Eyes: Negative for visual disturbance.  Respiratory: Positive for shortness of breath. Negative for cough and choking.   Cardiovascular: Negative for chest pain and leg swelling.  Gastrointestinal: Negative for vomiting, abdominal pain and diarrhea.  Genitourinary: Negative for  difficulty urinating.  Musculoskeletal: Negative for arthralgias.  Skin: Negative for rash.  Neurological: Negative for tremors, syncope and headaches.  Hematological: Does not bruise/bleed easily.       Objective:   Physical Exam   amb wf nad  Wt Readings from Last 3 Encounters:  10/11/13 178 lb (80.74 kg)  09/27/13 171 lb (77.565 kg)  08/31/13 172 lb 13.5 oz (78.4 kg)       HEENT mild turbinate edema.  Edentulous/ Oropharynx no thrush or excess pnd or cobblestoning.  No JVD or cervical adenopathy. Mild accessory muscle hypertrophy. Trachea midline, nl thryroid. Chest was min hyperinflated by percussion with diminished breath sounds and min increased exp time without wheeze. Hoover sign positive at very end of  inspiration. Regular rate and rhythm without murmur gallop or rub or increase P2 or edema.  Abd: no hsm, nl excursion. Ext warm without cyanosis or clubbing.     CXR  10/11/2013 :  There is no evidence of pneumonia nor pulmonary edema. Low-grade compensated CHF superimposed upon COPD is suspected.          Assessment & Plan:

## 2013-10-11 NOTE — Patient Instructions (Addendum)
Stop flovent  Start tudorza one twice daily and see if breathing improves and need for combivent drops  Please see patient coordinator before you leave today  to schedule portable 02 per [facility] 3lpm continous but no need for 02 at rest or room to room at home - always use it at bedtime though   Please remember to go to the lab and x-ray department downstairs for your tests - we will call you with the results when they are available.     Please schedule a follow up office visit in 4 weeks, sooner if needed with pfts

## 2013-10-13 ENCOUNTER — Telehealth: Payer: Self-pay | Admitting: Internal Medicine

## 2013-10-13 DIAGNOSIS — R0902 Hypoxemia: Secondary | ICD-10-CM

## 2013-10-13 DIAGNOSIS — J9610 Chronic respiratory failure, unspecified whether with hypoxia or hypercapnia: Secondary | ICD-10-CM

## 2013-10-13 NOTE — Telephone Encounter (Signed)
Called and spoke with [facility] and they stated that the office is out to lunch and she will leave a message and have them call us back to verify that they did get the order.

## 2013-10-17 NOTE — Telephone Encounter (Signed)
Spoke with Rodena Piety at [place] states that they got order stating Needs portable 02 at 3lpm continuous not pulsed ([facility]); pt currently uses portable concentrator pulse only; [facility] needs new order stating Change patient from POC to portable tank at 3lpm continuous; Rodena Piety aware that I will send order to [facility] to fax to them. Nothing more needed; will sign off on message.

## 2013-11-10 ENCOUNTER — Ambulatory Visit (INDEPENDENT_AMBULATORY_CARE_PROVIDER_SITE_OTHER): Payer: Medicare Other | Admitting: Internal Medicine

## 2013-11-10 ENCOUNTER — Encounter: Payer: Self-pay | Admitting: Internal Medicine

## 2013-11-10 VITALS — BP 122/60 | HR 85 | Temp 98.1°F | Ht 64.0 in | Wt 177.0 lb

## 2013-11-10 DIAGNOSIS — J9611 Chronic respiratory failure with hypoxia: Secondary | ICD-10-CM

## 2013-11-10 DIAGNOSIS — R0902 Hypoxemia: Secondary | ICD-10-CM

## 2013-11-10 DIAGNOSIS — J449 Chronic obstructive pulmonary disease, unspecified: Secondary | ICD-10-CM

## 2013-11-10 DIAGNOSIS — J4489 Other specified chronic obstructive pulmonary disease: Secondary | ICD-10-CM

## 2013-11-10 DIAGNOSIS — J9610 Chronic respiratory failure, unspecified whether with hypoxia or hypercapnia: Secondary | ICD-10-CM

## 2013-11-10 LAB — PULMONARY FUNCTION TEST
DL/VA % PRED: 82 %
DL/VA: 3.95 ml/min/mmHg/L
DLCO unc % pred: 68 %
DLCO unc: 16.6 ml/min/mmHg
FEF 25-75 POST: 0.91 L/s
FEF 25-75 PRE: 0.56 L/s
FEF2575-%CHANGE-POST: 62 %
FEF2575-%PRED-POST: 50 %
FEF2575-%PRED-PRE: 30 %
FEV1-%CHANGE-POST: 13 %
FEV1-%PRED-PRE: 58 %
FEV1-%Pred-Post: 66 %
FEV1-POST: 1.48 L
FEV1-Pre: 1.3 L
FEV1FVC-%Change-Post: 5 %
FEV1FVC-%PRED-PRE: 80 %
FEV6-%CHANGE-POST: 9 %
FEV6-%Pred-Post: 79 %
FEV6-%Pred-Pre: 73 %
FEV6-PRE: 2.04 L
FEV6-Post: 2.22 L
FEV6FVC-%Change-Post: 0 %
FEV6FVC-%Pred-Post: 102 %
FEV6FVC-%Pred-Pre: 101 %
FVC-%Change-Post: 8 %
FVC-%PRED-POST: 77 %
FVC-%Pred-Pre: 72 %
FVC-Post: 2.28 L
FVC-Pre: 2.11 L
POST FEV1/FVC RATIO: 65 %
Post FEV6/FVC ratio: 97 %
Pre FEV1/FVC ratio: 61 %
Pre FEV6/FVC Ratio: 97 %
RV % pred: 118 %
RV: 2.64 L
TLC % pred: 104 %
TLC: 5.27 L

## 2013-11-10 MED ORDER — ALBUTEROL SULFATE HFA 108 (90 BASE) MCG/ACT IN AERS: INHALATION_SPRAY | RESPIRATORY_TRACT | Status: DC

## 2013-11-10 MED ORDER — ACLIDINIUM BROMIDE 400 MCG/ACT IN AEPB: 1.0000 | INHALATION_SPRAY | Freq: Two times a day (BID) | RESPIRATORY_TRACT | Status: DC

## 2013-11-10 NOTE — Progress Notes (Signed)
PFT done today. 

## 2013-11-10 NOTE — Patient Instructions (Addendum)
Ok with me to adjust your 02 for saturation above 90%  Call me if not happy with tudorza and stop the combivent  Only use your albuterol )(Proair = red)as a rescue medication to be used if you can't catch your breath by resting or doing a relaxed purse lip breathing pattern.  - The less you use it, the better it will work when you need it. - Ok to use up to 2 puffs  every 4 hours if you must but call for immediate appointment if use goes up over your usual need - Don't leave home without it !!  (think of it like the spare tire for your car)    Please schedule a follow up visit in 6 months but call sooner if needed

## 2013-11-10 NOTE — Progress Notes (Signed)
Subjective:    Patient ID: [name], female    DOB: 1941/09/10  MRN: [id number]    Brief patient profile:  25 yowf quit smoking 2005 some doe (heavy exertion) at wt 175 and gradually downhill since despite eval by Chodri and rx with inhalers and 02 x around 2009 referred to pulmonary clinic  10/11/2013 by Dr Bea Graff with COPD GOLD II documented    History of Present Illness  10/11/2013 1st [facility] office visit/ Devanie Galanti cc indolent onset progressive doe x 10 y x one half aisle at grocery store on 02 3lpm and usually goes mb and back  s 02 and then sits down and uses combivent and feels better but never tries to rechallenge herself p combivent.  Never has resting sob.  maint on flovent/ combivent  No change in doe Sunday vs Tuesday p hd on mondays rec Stop flovent Start tudorza one twice daily and see if breathing improves and need for combivent drops Please see patient coordinator before you leave today  to schedule portable 02 per [facility] 3lpm continous but no need for 02 at rest or room to room at home - always use it at bedtime though      11/10/2013 f/u ov/Dayzha Pogosyan re:  GOLD II COPD/02 dep / maint on tudorza only  Chief Complaint  Patient presents with  . Follow-up    Pt states that her breathing is much improved.  Has not needed combivent for rescue.     Wear 02 and walk ok flat and slow "anywhere she wants" now.   No obvious day to day or daytime variabilty or assoc chronic cough or cp or chest tightness, subjective wheeze overt sinus or hb symptoms. No unusual exp hx or h/o childhood pna/ asthma or knowledge of premature birth.  Sleeping ok without nocturnal  or early am exacerbation  of respiratory  c/o\'s or need for noct saba. Also denies any obvious fluctuation of symptoms with weather or environmental changes or other aggravating or alleviating factors except as outlined above   Current Medications, Allergies, Complete Past Medical History, Past Surgical History, Family  History, and Social History were reviewed in [facility] electronic medical record.  ROS  The following are not active complaints unless bolded sore throat, dysphagia, dental problems, itching, sneezing,  nasal congestion or excess/ purulent secretions, ear ache,   fever, chills, sweats, unintended wt loss, pleuritic or exertional cp, hemoptysis,  orthopnea pnd or leg swelling, presyncope, palpitations, heartburn, abdominal pain, anorexia, nausea, vomiting, diarrhea  or change in bowel or urinary habits, change in stools or urine, dysuria,hematuria,  rash, arthralgias, visual complaints, headache, numbness weakness or ataxia or problems with walking or coordination,  change in mood/affect or memory.                  .       Objective:   Physical Exam   amb wf nad  11/10/2013       17 7  Wt Readings from Last 3 Encounters:  10/11/13 178 lb (80.74 kg)  09/27/13 171 lb (77.565 kg)  08/31/13 172 lb 13.5 oz (78.4 kg)       HEENT mild turbinate edema.  Edentulous/ Oropharynx no thrush or excess pnd or cobblestoning.  No JVD or cervical adenopathy. Mild accessory muscle hypertrophy. Trachea midline, nl thryroid. Chest was min hyperinflated by percussion with diminished breath sounds and min increased exp time without wheeze. Hoover sign positive at very end of  inspiration. Regular rate  and rhythm without murmur gallop or rub or increase P2 or edema.  Abd: no hsm, nl excursion. Ext warm without cyanosis or clubbing.     CXR  10/11/2013 :  There is no evidence of pneumonia nor pulmonary edema. Low-grade compensated CHF superimposed upon COPD is suspected.          Assessment & Plan:

## 2013-11-11 NOTE — Assessment & Plan Note (Addendum)
-   02 dep since around 2009  - PFT's 11/10/2013  FEV1 1.30 ( 58%) ratio 61 and 16% better p B2 ,  DLCO  68 and corrects to 82   Adequate control on present rx, reviewed > no change in rx needed  Except change combivent to saba prn now that on LAMA    Each maintenance medication was reviewed in detail including most importantly the difference between maintenance and as needed and under what circumstances the prns are to be used.  Please see instructions for details which were reviewed in writing and the patient given a copy.

## 2013-11-11 NOTE — Assessment & Plan Note (Signed)
02 dep since arond 2009 10/11/2013   Walked RA x one lap @ 185 stopped due to  desat to 77% from baseline resting sats of 94% and ok sats on 3lpm x one lap  Ok to self titrate except at hs

## 2013-11-11 NOTE — Assessment & Plan Note (Signed)
She has been self titrating 02 which is fine with me x at hs should leave it on 2lpm

## 2013-11-18 ENCOUNTER — Telehealth: Payer: Self-pay | Admitting: Internal Medicine

## 2013-11-18 DIAGNOSIS — J449 Chronic obstructive pulmonary disease, unspecified: Secondary | ICD-10-CM

## 2013-11-18 DIAGNOSIS — J811 Chronic pulmonary edema: Secondary | ICD-10-CM

## 2013-11-18 NOTE — Telephone Encounter (Signed)
Order has been placed for the pt to have OCD titration.  i have called mandy at [facility] and she is aware of order that has been placed.  Nothing further is needed.

## 2013-11-18 NOTE — Telephone Encounter (Signed)
lmomtcb x 1 for mandy from [facility] to call back .

## 2013-11-18 NOTE — Telephone Encounter (Signed)
mandy called back wanted to see if ok to get an order for an OCD titration.  The pt had the POC tanks and these were too heavy so they want to make sure that the smaller portable tanks will work for the pt.  MW please advise if ok to send in the order for OCD titration to [facility].  Thanks  Allergies  Allergen Reactions  . Morphine And Related Nausea And Vomiting  . Lipitor [Atorvastatin] Other (See Comments)    Causes weakness and drowsiness  . Nsaids     Unknown reported by previous hospital     Current Outpatient Prescriptions on File Prior to Visit  Medication Sig Dispense Refill  . Aclidinium Bromide (TUDORZA PRESSAIR) 400 MCG/ACT AEPB Inhale 1 puff into the lungs 2 (two) times daily. One twice daily  1 each  11  . albuterol (PROAIR HFA) 108 (90 BASE) MCG/ACT inhaler 2 puffs every 4 hours as needed only  if your can't catch your breath  1 Inhaler  11  . amiodarone (PACERONE) 400 MG tablet Take 400 mg by mouth daily.      Marland Kitchen amLODipine (NORVASC) 10 MG tablet Take 1 tablet (10 mg total) by mouth daily.  30 tablet  1  . aspirin 81 MG tablet Take 81 mg by mouth daily.      . calcium acetate (PHOSLO) 667 MG capsule Take 2,001 mg by mouth 3 (three) times daily with meals.      Marland Kitchen ezetimibe (ZETIA) 10 MG tablet Take 10 mg by mouth at bedtime.       . furosemide (LASIX) 80 MG tablet Take 160 mg by mouth daily.       Marland Kitchen glipiZIDE (GLUCOTROL XL) 2.5 MG 24 hr tablet Take 2.5 mg by mouth daily as needed.       Marland Kitchen rOPINIRole (REQUIP) 2 MG tablet Take 2 mg by mouth at bedtime.       No current facility-administered medications on file prior to visit.

## 2013-11-18 NOTE — Telephone Encounter (Signed)
Ok to order OCD titration

## 2013-12-09 ENCOUNTER — Telehealth: Payer: Self-pay | Admitting: Internal Medicine

## 2013-12-09 DIAGNOSIS — J449 Chronic obstructive pulmonary disease, unspecified: Secondary | ICD-10-CM

## 2013-12-09 NOTE — Telephone Encounter (Signed)
Fine with me to write it whatever way they need it

## 2013-12-09 NOTE — Telephone Encounter (Signed)
Order has been placed. Nothing further needed. 

## 2013-12-09 NOTE — Telephone Encounter (Signed)
Spoke with [employer]. She is requesting order for activox POC. Need order to state change Eclipse POC d/t this being too heavy to activox POC. Order needs to state this exactly per [facility]. Per Leafy Ro pt is able to tolerate pulse dose. Please advise MW of okay? thanks

## 2014-01-31 ENCOUNTER — Telehealth: Payer: Self-pay | Admitting: Internal Medicine

## 2014-01-31 NOTE — Telephone Encounter (Signed)
Called [facility] [address] to see if ventolin/proventil is least expensive for pt. Proventil HFA is being rejected stating proair is preferred Ventolin is being rejected stating same thing. Proair is $50.93. I called [name] and made her aware. She will let pt know. Nothing further needed

## 2014-01-31 NOTE — Telephone Encounter (Signed)
No generics available but should be able to find one form of albuterol hfa that will suffice - if not the other option is Part B albuterol neb prn which is available as generic

## 2014-01-31 NOTE — Telephone Encounter (Signed)
Pt uses [facility] [address] [place] Needing a change in Rx-- ProAir too expensive- $300+. Please advise Dr Melvyn Novas if okay to change to one of the other albuterol HFA

## 2014-02-01 ENCOUNTER — Telehealth: Payer: Self-pay | Admitting: Internal Medicine

## 2014-02-01 NOTE — Telephone Encounter (Signed)
Spoke with [name] Pt needs an alternative rx for Tudorza--this is too expensive.   Please advise Dr Melvyn Novas what we can change this to. Thanks.

## 2014-02-01 NOTE — Telephone Encounter (Signed)
lmomtcb x1 

## 2014-02-01 NOTE — Telephone Encounter (Signed)
The only cheaper alternative is atrovent hfa 2 qid

## 2014-02-02 MED ORDER — IPRATROPIUM BROMIDE HFA 17 MCG/ACT IN AERS: 2.0000 | INHALATION_SPRAY | Freq: Four times a day (QID) | RESPIRATORY_TRACT | Status: DC

## 2014-02-02 NOTE — Telephone Encounter (Signed)
Pt's daughter Fraser Din returned call.  Spoke with Fraser Din and informed her of MW's recommendations regarding a cheaper alternative to pt's [place].  Fraser Din is okay with the switch to Atrovent HFA 2 puffs QID and [facility] [place].  Advised Fraser Din will be happy to do this and encouraged her to please call with any further questions/concerns.    Caprice Renshaw removed from med list; Atrovent sent to pharmacy.

## 2014-02-02 NOTE — Telephone Encounter (Signed)
lmomtcb for [name] x 2

## 2014-02-26 ENCOUNTER — Inpatient Hospital Stay (HOSPITAL_COMMUNITY)
Admission: RE | Admit: 2014-02-26 | Discharge: 2014-02-28 | DRG: 177 | Disposition: A | Discharge status: Home / Self Care | Payer: Medicare Other | Source: Other Acute Inpatient Hospital | Attending: Internal Medicine | Admitting: Internal Medicine

## 2014-02-26 ENCOUNTER — Inpatient Hospital Stay (HOSPITAL_COMMUNITY): Payer: Medicare Other

## 2014-02-26 ENCOUNTER — Encounter (HOSPITAL_COMMUNITY): Payer: Self-pay | Admitting: *Deleted

## 2014-02-26 DIAGNOSIS — G4730 Sleep apnea, unspecified: Secondary | ICD-10-CM | POA: Diagnosis present

## 2014-02-26 DIAGNOSIS — I120 Hypertensive chronic kidney disease with stage 5 chronic kidney disease or end stage renal disease: Secondary | ICD-10-CM | POA: Diagnosis present

## 2014-02-26 DIAGNOSIS — Z8249 Family history of ischemic heart disease and other diseases of the circulatory system: Secondary | ICD-10-CM

## 2014-02-26 DIAGNOSIS — I2510 Atherosclerotic heart disease of native coronary artery without angina pectoris: Secondary | ICD-10-CM | POA: Diagnosis present

## 2014-02-26 DIAGNOSIS — E875 Hyperkalemia: Secondary | ICD-10-CM

## 2014-02-26 DIAGNOSIS — I739 Peripheral vascular disease, unspecified: Secondary | ICD-10-CM | POA: Diagnosis present

## 2014-02-26 DIAGNOSIS — Z886 Allergy status to analgesic agent status: Secondary | ICD-10-CM

## 2014-02-26 DIAGNOSIS — Z992 Dependence on renal dialysis: Secondary | ICD-10-CM

## 2014-02-26 DIAGNOSIS — N039 Chronic nephritic syndrome with unspecified morphologic changes: Secondary | ICD-10-CM

## 2014-02-26 DIAGNOSIS — Z800 Family history of malignant neoplasm of digestive organs: Secondary | ICD-10-CM

## 2014-02-26 DIAGNOSIS — Z9981 Dependence on supplemental oxygen: Secondary | ICD-10-CM

## 2014-02-26 DIAGNOSIS — Z9861 Coronary angioplasty status: Secondary | ICD-10-CM

## 2014-02-26 DIAGNOSIS — I10 Essential (primary) hypertension: Secondary | ICD-10-CM

## 2014-02-26 DIAGNOSIS — N2581 Secondary hyperparathyroidism of renal origin: Secondary | ICD-10-CM | POA: Diagnosis present

## 2014-02-26 DIAGNOSIS — Z888 Allergy status to other drugs, medicaments and biological substances status: Secondary | ICD-10-CM

## 2014-02-26 DIAGNOSIS — Z833 Family history of diabetes mellitus: Secondary | ICD-10-CM

## 2014-02-26 DIAGNOSIS — J811 Chronic pulmonary edema: Secondary | ICD-10-CM

## 2014-02-26 DIAGNOSIS — J810 Acute pulmonary edema: Secondary | ICD-10-CM

## 2014-02-26 DIAGNOSIS — Z87891 Personal history of nicotine dependence: Secondary | ICD-10-CM

## 2014-02-26 DIAGNOSIS — J9620 Acute and chronic respiratory failure, unspecified whether with hypoxia or hypercapnia: Secondary | ICD-10-CM | POA: Diagnosis present

## 2014-02-26 DIAGNOSIS — K219 Gastro-esophageal reflux disease without esophagitis: Secondary | ICD-10-CM | POA: Diagnosis present

## 2014-02-26 DIAGNOSIS — I509 Heart failure, unspecified: Secondary | ICD-10-CM | POA: Diagnosis present

## 2014-02-26 DIAGNOSIS — J441 Chronic obstructive pulmonary disease with (acute) exacerbation: Secondary | ICD-10-CM | POA: Diagnosis present

## 2014-02-26 DIAGNOSIS — A481 Legionnaires' disease: Principal | ICD-10-CM

## 2014-02-26 DIAGNOSIS — Z823 Family history of stroke: Secondary | ICD-10-CM

## 2014-02-26 DIAGNOSIS — Z885 Allergy status to narcotic agent status: Secondary | ICD-10-CM

## 2014-02-26 DIAGNOSIS — D509 Iron deficiency anemia, unspecified: Secondary | ICD-10-CM | POA: Diagnosis present

## 2014-02-26 DIAGNOSIS — I6529 Occlusion and stenosis of unspecified carotid artery: Secondary | ICD-10-CM | POA: Diagnosis present

## 2014-02-26 DIAGNOSIS — Z7982 Long term (current) use of aspirin: Secondary | ICD-10-CM

## 2014-02-26 DIAGNOSIS — N186 End stage renal disease: Secondary | ICD-10-CM

## 2014-02-26 DIAGNOSIS — Z85528 Personal history of other malignant neoplasm of kidney: Secondary | ICD-10-CM

## 2014-02-26 DIAGNOSIS — D631 Anemia in chronic kidney disease: Secondary | ICD-10-CM | POA: Diagnosis present

## 2014-02-26 DIAGNOSIS — E7800 Pure hypercholesterolemia, unspecified: Secondary | ICD-10-CM | POA: Diagnosis present

## 2014-02-26 DIAGNOSIS — J189 Pneumonia, unspecified organism: Secondary | ICD-10-CM | POA: Diagnosis present

## 2014-02-26 DIAGNOSIS — I252 Old myocardial infarction: Secondary | ICD-10-CM

## 2014-02-26 DIAGNOSIS — I4891 Unspecified atrial fibrillation: Secondary | ICD-10-CM

## 2014-02-26 DIAGNOSIS — N189 Chronic kidney disease, unspecified: Secondary | ICD-10-CM

## 2014-02-26 DIAGNOSIS — I272 Pulmonary hypertension, unspecified: Secondary | ICD-10-CM

## 2014-02-26 DIAGNOSIS — E119 Type 2 diabetes mellitus without complications: Secondary | ICD-10-CM

## 2014-02-26 DIAGNOSIS — R0602 Shortness of breath: Secondary | ICD-10-CM

## 2014-02-26 DIAGNOSIS — I2789 Other specified pulmonary heart diseases: Secondary | ICD-10-CM

## 2014-02-26 DIAGNOSIS — J449 Chronic obstructive pulmonary disease, unspecified: Secondary | ICD-10-CM

## 2014-02-26 LAB — CBC WITH DIFFERENTIAL/PLATELET
Basophils Absolute: 0 10*3/uL (ref 0.0–0.1)
Basophils Relative: 0 % (ref 0–1)
EOS PCT: 0 % (ref 0–5)
Eosinophils Absolute: 0 10*3/uL (ref 0.0–0.7)
HEMATOCRIT: 30.9 % — AB (ref 36.0–46.0)
Hemoglobin: 9.1 g/dL — ABNORMAL LOW (ref 12.0–15.0)
LYMPHS ABS: 0.6 10*3/uL — AB (ref 0.7–4.0)
Lymphocytes Relative: 4 % — ABNORMAL LOW (ref 12–46)
MCH: 32.9 pg (ref 26.0–34.0)
MCHC: 29.4 g/dL — ABNORMAL LOW (ref 30.0–36.0)
MCV: 111.6 fL — AB (ref 78.0–100.0)
Monocytes Absolute: 0.1 10*3/uL (ref 0.1–1.0)
Monocytes Relative: 1 % — ABNORMAL LOW (ref 3–12)
NEUTROS ABS: 14.1 10*3/uL — AB (ref 1.7–7.7)
Neutrophils Relative %: 95 % — ABNORMAL HIGH (ref 43–77)
Platelets: 211 10*3/uL (ref 150–400)
RBC: 2.77 MIL/uL — AB (ref 3.87–5.11)
RDW: 16.7 % — ABNORMAL HIGH (ref 11.5–15.5)
WBC: 14.8 10*3/uL — AB (ref 4.0–10.5)

## 2014-02-26 LAB — BASIC METABOLIC PANEL
BUN: 47 mg/dL — AB (ref 6–23)
CHLORIDE: 96 meq/L (ref 96–112)
CO2: 28 mEq/L (ref 19–32)
CREATININE: 7.09 mg/dL — AB (ref 0.50–1.10)
Calcium: 9 mg/dL (ref 8.4–10.5)
GFR calc Af Amer: 6 mL/min — ABNORMAL LOW (ref >90)
GFR calc non Af Amer: 5 mL/min — ABNORMAL LOW (ref >90)
GLUCOSE: 165 mg/dL — AB (ref 70–99)
Potassium: 5.4 mEq/L — ABNORMAL HIGH (ref 3.7–5.3)
Sodium: 139 mEq/L (ref 137–147)

## 2014-02-26 LAB — BLOOD GAS, ARTERIAL
ACID-BASE DEFICIT: 0.4 mmol/L (ref 0.0–2.0)
Acid-base deficit: 0.8 mmol/L (ref 0.0–2.0)
Bicarbonate: 27 mEq/L — ABNORMAL HIGH (ref 20.0–24.0)
Bicarbonate: 24.6 mEq/L — ABNORMAL HIGH (ref 20.0–24.0)
DRAWN BY: 10552
EXPIRATORY PAP: 5
FIO2: 0.4 %
INSPIRATORY PAP: 12
Mode: POSITIVE
O2 CONTENT: 5 L/min
O2 SAT: 85 %
O2 SAT: 98.4 %
PATIENT TEMPERATURE: 98.6
PCO2 ART: 73.9 mmHg — AB (ref 35.0–45.0)
PO2 ART: 142 mmHg — AB (ref 80.0–100.0)
Patient temperature: 98.6
TCO2: 29.2 mmol/L (ref 0–100)
TCO2: 26.1 mmol/L (ref 0–100)
pCO2 arterial: 49.3 mmHg — ABNORMAL HIGH (ref 35.0–45.0)
pH, Arterial: 7.188 — CL (ref 7.350–7.450)
pH, Arterial: 7.318 — ABNORMAL LOW (ref 7.350–7.450)
pO2, Arterial: 62.7 mmHg — ABNORMAL LOW (ref 80.0–100.0)

## 2014-02-26 LAB — GLUCOSE, CAPILLARY
Glucose-Capillary: 169 mg/dL — ABNORMAL HIGH (ref 70–99)
Glucose-Capillary: 173 mg/dL — ABNORMAL HIGH (ref 70–99)
Glucose-Capillary: 149 mg/dL — ABNORMAL HIGH (ref 70–99)
Glucose-Capillary: 186 mg/dL — ABNORMAL HIGH (ref 70–99)

## 2014-02-26 LAB — MRSA PCR SCREENING: MRSA BY PCR: POSITIVE — AB

## 2014-02-26 LAB — STREP PNEUMONIAE URINARY ANTIGEN: Strep Pneumo Urinary Antigen: NEGATIVE

## 2014-02-26 MED ORDER — MUPIROCIN 2 % EX OINT
1.0000 "application " | TOPICAL_OINTMENT | Freq: Two times a day (BID) | CUTANEOUS | Status: DC
  Administered 2014-02-26 – 2014-02-27 (×4): 1 via NASAL
  Filled 2014-02-26: qty 22

## 2014-02-26 MED ORDER — IPRATROPIUM-ALBUTEROL 0.5-2.5 (3) MG/3ML IN SOLN
3.0000 mL | Freq: Four times a day (QID) | RESPIRATORY_TRACT | Status: DC
  Administered 2014-02-26 – 2014-02-28 (×5): 3 mL via RESPIRATORY_TRACT
  Filled 2014-02-26 (×7): qty 3

## 2014-02-26 MED ORDER — ALBUTEROL SULFATE (2.5 MG/3ML) 0.083% IN NEBU
2.5000 mg | INHALATION_SOLUTION | RESPIRATORY_TRACT | Status: DC | PRN
  Administered 2014-02-26 – 2014-02-27 (×3): 2.5 mg via RESPIRATORY_TRACT
  Filled 2014-02-26 (×2): qty 3

## 2014-02-26 MED ORDER — DEXTROSE 5 % IV SOLN: 2.0000 g | INTRAVENOUS | Status: DC

## 2014-02-26 MED ORDER — BUDESONIDE 0.25 MG/2ML IN SUSP
0.2500 mg | Freq: Two times a day (BID) | RESPIRATORY_TRACT | Status: DC
  Administered 2014-02-26 – 2014-02-27 (×4): 0.25 mg via RESPIRATORY_TRACT
  Filled 2014-02-26 (×7): qty 2

## 2014-02-26 MED ORDER — METHYLPREDNISOLONE SODIUM SUCC 125 MG IJ SOLR
60.0000 mg | Freq: Four times a day (QID) | INTRAMUSCULAR | Status: DC
  Administered 2014-02-26 – 2014-02-27 (×5): 60 mg via INTRAVENOUS
  Filled 2014-02-26 (×8): qty 0.96

## 2014-02-26 MED ORDER — FUROSEMIDE 80 MG PO TABS
160.0000 mg | ORAL_TABLET | Freq: Every day | ORAL | Status: DC
  Administered 2014-02-26 – 2014-02-28 (×3): 160 mg via ORAL
  Filled 2014-02-26 (×3): qty 2

## 2014-02-26 MED ORDER — DEXTROSE 5 % IV SOLN
2.0000 g | INTRAVENOUS | Status: DC
  Filled 2014-02-26: qty 2

## 2014-02-26 MED ORDER — HEPARIN SODIUM (PORCINE) 5000 UNIT/ML IJ SOLN
5000.0000 [IU] | Freq: Three times a day (TID) | INTRAMUSCULAR | Status: DC
  Administered 2014-02-26 – 2014-02-28 (×7): 5000 [IU] via SUBCUTANEOUS
  Filled 2014-02-26 (×10): qty 1

## 2014-02-26 MED ORDER — ROPINIROLE HCL 1 MG PO TABS
2.0000 mg | ORAL_TABLET | Freq: Every day | ORAL | Status: DC
  Administered 2014-02-26: 2 mg via ORAL
  Filled 2014-02-26 (×3): qty 2

## 2014-02-26 MED ORDER — VANCOMYCIN HCL IN DEXTROSE 1-5 GM/200ML-% IV SOLN
1000.0000 mg | INTRAVENOUS | Status: DC
  Filled 2014-02-26: qty 200

## 2014-02-26 MED ORDER — SODIUM CHLORIDE 0.9 % IJ SOLN: 3.0000 mL | Freq: Two times a day (BID) | INTRAMUSCULAR | Status: DC

## 2014-02-26 MED ORDER — AMIODARONE HCL 200 MG PO TABS
400.0000 mg | ORAL_TABLET | Freq: Every day | ORAL | Status: DC
  Administered 2014-02-26 – 2014-02-28 (×3): 400 mg via ORAL
  Filled 2014-02-26 (×3): qty 2

## 2014-02-26 MED ORDER — INSULIN ASPART 100 UNIT/ML ~~LOC~~ SOLN
0.0000 [IU] | Freq: Every day | SUBCUTANEOUS | Status: DC
  Administered 2014-02-27: 3 [IU] via SUBCUTANEOUS

## 2014-02-26 MED ORDER — ASPIRIN 81 MG PO CHEW
81.0000 mg | CHEWABLE_TABLET | Freq: Every day | ORAL | Status: DC
  Administered 2014-02-26 – 2014-02-28 (×3): 81 mg via ORAL
  Filled 2014-02-26 (×3): qty 1

## 2014-02-26 MED ORDER — ALBUTEROL SULFATE (2.5 MG/3ML) 0.083% IN NEBU: 2.5000 mg | INHALATION_SOLUTION | RESPIRATORY_TRACT | Status: DC | PRN

## 2014-02-26 MED ORDER — CALCIUM ACETATE 667 MG PO CAPS
2001.0000 mg | ORAL_CAPSULE | Freq: Three times a day (TID) | ORAL | Status: DC
  Administered 2014-02-26 – 2014-02-28 (×6): 2001 mg via ORAL
  Filled 2014-02-26 (×9): qty 3

## 2014-02-26 MED ORDER — EZETIMIBE 10 MG PO TABS
10.0000 mg | ORAL_TABLET | Freq: Every day | ORAL | Status: DC
  Administered 2014-02-26 – 2014-02-27 (×2): 10 mg via ORAL
  Filled 2014-02-26 (×3): qty 1

## 2014-02-26 MED ORDER — DEXTROSE 5 % IV SOLN: 1.0000 g | INTRAVENOUS | Status: DC

## 2014-02-26 MED ORDER — VANCOMYCIN HCL 10 G IV SOLR
1750.0000 mg | Freq: Once | INTRAVENOUS | Status: AC
  Administered 2014-02-26: 1750 mg via INTRAVENOUS
  Filled 2014-02-26: qty 1750

## 2014-02-26 MED ORDER — CHLORHEXIDINE GLUCONATE CLOTH 2 % EX PADS
6.0000 | MEDICATED_PAD | Freq: Every day | CUTANEOUS | Status: DC
  Administered 2014-02-26 – 2014-02-28 (×3): 6 via TOPICAL

## 2014-02-26 MED ORDER — INSULIN ASPART 100 UNIT/ML ~~LOC~~ SOLN
0.0000 [IU] | Freq: Three times a day (TID) | SUBCUTANEOUS | Status: DC
  Administered 2014-02-26: 3 [IU] via SUBCUTANEOUS
  Administered 2014-02-26: 2 [IU] via SUBCUTANEOUS
  Administered 2014-02-27 (×2): 3 [IU] via SUBCUTANEOUS
  Administered 2014-02-28: 5 [IU] via SUBCUTANEOUS

## 2014-02-26 MED ORDER — SODIUM CHLORIDE 0.9 % IV SOLN: 250.0000 mL | INTRAVENOUS | Status: DC | PRN

## 2014-02-26 MED ORDER — METHYLPREDNISOLONE SODIUM SUCC 125 MG IJ SOLR
125.0000 mg | Freq: Once | INTRAMUSCULAR | Status: AC
  Administered 2014-02-26: 125 mg via INTRAVENOUS
  Filled 2014-02-26: qty 2

## 2014-02-26 MED ORDER — SODIUM CHLORIDE 0.9 % IJ SOLN: 3.0000 mL | INTRAMUSCULAR | Status: DC | PRN

## 2014-02-26 NOTE — H&P (Signed)
PCP:   Gilford Rile, MD   Chief Complaint:  Sob and cough  HPI: 73 yo female esrd dialysis mwf, cdiff over a year ago so has minimized her abx since, copd, cafib comes in from transfer from [place] for sob and cough productive for several days.  No fevers.  No n/v/d.  No swelling.  On arrival to  her oxygen sats were 81% on RA.  Improved to 91% on 2 Liters.  Her wbc 14, cr around 6, k nml, cxr shows asympetrical edema vs atypical pna.  She was given albuterol and cefepime along with steroids.  She has arrived to stepdown at [facility].  Denies any pain.  Feels better with the oxygen on.    Review of Systems:  Positive and negative as per HPI otherwise all other systems are negative  Past Medical History: Past Medical History  Diagnosis Date  . Carotid artery occlusion     bilat CEA in 2012  . C. 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 Hypertension   . Atrial fibrillation April  2013  . Pulmonary hypertension   . Secondary hyperparathyroidism, renal   . COPD (chronic obstructive pulmonary disease)   . Hypercholesterolemia   . History of MI (myocardial infarction)     "they say I had 2 years ago" (08/20/2012)  . GERD (gastroesophageal reflux disease)   . History of pneumonia     "have it alot" (08/20/2012)  . Sleep apnea     "don't wear mask anymore"  . Type II diabetes mellitus   . History of blood transfusion 2013    "3 times so far in 2013:  4 pints one time; 2 pints another; ?# last time" (08/20/2012)  . Iron deficiency anemia   . Seizures 2012    after carotid surgery  . ESRD on dialysis     "[place]; Tues, Thurs; Sat"  . On home oxygen therapy     "24/7"   . Acute diverticulitis     Oct 2013, treated medically [facility], sigmoid diverticulitits  . CHF (congestive heart failure)   . Coronary artery disease   . Seizure disorder March 2011  . Myocardial infarction   . Renal cell carcinoma 2005?  Marland Kitchen Cancer of kidney 2005   Past  Surgical History  Procedure Laterality Date  . Nephrectomy  2005    Right, for Renal cell carcinoma  . Carotid endarterectomy  2012    bilaterally; Dr. Kellie Simmering  . Av fistula placement  Jan. 7, 2011    Right  upper arm by Dr. Kellie Simmering  . Hd catheter  Aug. 22, 2013    [facility]  . Appendectomy      childhood  . Abdominal hysterectomy  1971?  Marland Kitchen Coronary angioplasty with stent placement  1990's    "1 total"  . Coronary angioplasty  1990's  . Av fistula repair  2013    right upper arm  . Esophagogastroduodenoscopy N/A 12/29/2012    Procedure: ESOPHAGOGASTRODUODENOSCOPY (EGD);  Surgeon: Jeryl Columbia, MD;  Location: [facility] ENDOSCOPY;  Service: Endoscopy;  Laterality: N/A;  . Colonoscopy with propofol N/A 12/31/2012    Procedure: COLONOSCOPY WITH PROPOFOL;  Surgeon: Jeryl Columbia, MD;  Location: [facility] ENDOSCOPY;  Service: Endoscopy;  Laterality: N/A;  currently IP at [facility] 3307    Medications: Prior to Admission medications   Medication Sig Start Date End Date Taking? Authorizing Provider  albuterol (PROAIR HFA) 108 (90 BASE) MCG/ACT inhaler 2 puffs every 4  hours as needed only  if your can't catch your breath 11/10/13   Tanda Rockers, MD  amiodarone (PACERONE) 400 MG tablet Take 400 mg by mouth daily.    Historical Provider, MD  amLODipine (NORVASC) 10 MG tablet Take 1 tablet (10 mg total) by mouth daily. 08/31/13   Barton Dubois, MD  aspirin 81 MG tablet Take 81 mg by mouth daily.    Historical Provider, MD  calcium acetate (PHOSLO) 667 MG capsule Take 2,001 mg by mouth 3 (three) times daily with meals.    Historical Provider, MD  ezetimibe (ZETIA) 10 MG tablet Take 10 mg by mouth at bedtime.     Historical Provider, MD  furosemide (LASIX) 80 MG tablet Take 160 mg by mouth daily.  09/21/13   Historical Provider, MD  glipiZIDE (GLUCOTROL XL) 2.5 MG 24 hr tablet Take 2.5 mg by mouth daily as needed.     Historical Provider, MD  ipratropium (ATROVENT HFA) 17 MCG/ACT inhaler Inhale 2 puffs into the  lungs 4 (four) times daily. 02/02/14   Tanda Rockers, MD  rOPINIRole (REQUIP) 2 MG tablet Take 2 mg by mouth at bedtime.    Historical Provider, MD    Allergies:   Allergies  Allergen Reactions  . Morphine And Related Nausea And Vomiting  . Lipitor [Atorvastatin] Other (See Comments)    Causes weakness and drowsiness  . Nsaids     Unknown reported by previous hospital    Social History:  reports that she quit smoking about 10 years ago. Her smoking use included Cigarettes. She has a 67.5 pack-year smoking history. She has never used smokeless tobacco. She reports that she does not drink alcohol or use illicit drugs.  Family History: Family History  Problem Relation Age of Onset  . Cancer Mother     pancreatic  . Diabetes Mother   . Stroke Father   . Coronary artery disease Father   . Heart disease Father 63    Heart Disease before age 50  . Hyperlipidemia Father   . Hypertension Father   . Heart attack Father   . Cancer Brother     Brain  . Kidney disease Brother     Kidney stones  . Emphysema Father     smoked  . Pancreatic cancer Mother     Physical Exam: Filed Vitals:   02/26/14 0330 02/26/14 0345 02/26/14 0400 02/26/14 0415  BP: 152/60 139/67 151/62 133/54  Pulse: 94 92 89 90  Temp: 98 F (36.7 C)     TempSrc: Oral     Resp: 25 21 25 22   Height: 5\' 4"  (1.626 m)     Weight: 81.1 kg (178 lb 12.7 oz)     SpO2: 94% 96% 95% 96%   General appearance: alert, cooperative and no distress Head: Normocephalic, without obvious abnormality, atraumatic Eyes: negative Nose: Nares normal. Septum midline. Mucosa normal. No drainage or sinus tenderness. Neck: no JVD and supple, symmetrical, trachea midline Lungs: rhonchi bibasilar Heart: regular rate and rhythm, S1, S2 normal, no murmur, click, rub or gallop Abdomen: soft, non-tender; bowel sounds normal; no masses,  no organomegaly Extremities: extremities normal, atraumatic, no cyanosis or edema Pulses: 2+ and  symmetric Skin: Skin color, texture, turgor normal. No rashes or lesions Neurologic: Grossly normal    Labs on Admission:    All records from [place] reviewed   Radiological Exams on Admission: No results found.  Assessment/Plan  73 yo female esrd with probable pna  Principal Problem:  PNA (pneumonia)-  Place on broad spectrum abx including vanco/cefepime.  pna pathway.  Oxygen prn.    Active Problems:   CKD (chronic kidney disease) stage V requiring chronic dialysis-  Left message for dialysis in am on answering machine.  No emergent needs tonight.   DM2 (diabetes mellitus, type 2)   Pulmonary edema   COPD GOLD II, -  freq nebs.  Will hold off on steroids for now, reconsider if does not improve with abx and bronchodilators   SOB (shortness of breath)   Pulmonary HTN   Anemia in chronic kidney disease  Admit to stepdown, full code.  Jabbar Palmero A Shanon Brow 02/26/2014, 4:33 AM

## 2014-02-26 NOTE — Progress Notes (Signed)
ANTIBIOTIC CONSULT NOTE - INITIAL  Pharmacy Consult for Vancomycin/Cefepime Indication: rule out pneumonia, HCAP  Allergies  Allergen Reactions  . Morphine And Related Nausea And Vomiting  . Lipitor [Atorvastatin] Other (See Comments)    Causes weakness and drowsiness  . Nsaids     Unknown reported by previous hospital   Patient Measurements: Height: 5\' 4"  (162.6 cm) Weight: 178 lb 12.7 oz (81.1 kg) IBW/kg (Calculated) : 54.7  Vital Signs: Temp: 98 F (36.7 C) (05/31 0330) Temp src: Oral (05/31 0330) BP: 133/54 mmHg (05/31 0415) Pulse Rate: 90 (05/31 0415)  Labs: WBC 14.3 (from outside hospital)  Medical History: Past Medical History  Diagnosis Date  . Carotid artery occlusion     bilat CEA in 2012  . C. 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 Hypertension   . Atrial fibrillation April  2013  . Pulmonary hypertension   . Secondary hyperparathyroidism, renal   . COPD (chronic obstructive pulmonary disease)   . Hypercholesterolemia   . History of MI (myocardial infarction)     "they say I had 2 years ago" (08/20/2012)  . GERD (gastroesophageal reflux disease)   . History of pneumonia     "have it alot" (08/20/2012)  . Sleep apnea     "don't wear mask anymore"  . Type II diabetes mellitus   . History of blood transfusion 2013    "3 times so far in 2013:  4 pints one time; 2 pints another; ?# last time" (08/20/2012)  . Iron deficiency anemia   . Seizures 2012    after carotid surgery  . ESRD on dialysis     "[place]; Tues, Thurs; Sat"  . On home oxygen therapy     "24/7"   . Acute diverticulitis     Oct 2013, treated medically [facility], sigmoid diverticulitits  . CHF (congestive heart failure)   . Coronary artery disease   . Seizure disorder March 2011  . Myocardial infarction   . Renal cell carcinoma 2005?  Marland Kitchen Cancer of kidney 2005   Assessment: [name] with ESRD on HD MWF, to start broad spectrum antibiotics for possible  HCAP.  Goal of Therapy:  Pre-HD vancomycin level 15-25 mg/L  Plan:  -Vancomycin 1750 mg IV x1, then 1000 mg IV qHD-MWF -Cefepime 2g IV q1800-MWF -Trend WBC, temp -Drug levels as indicated   [name] 02/26/2014,5:01 AM

## 2014-02-26 NOTE — Progress Notes (Signed)
  [facility] TEAM 1 - Stepdown/ICU TEAM Progress Note  [name]:[id number] DOB: 1941/01/20 DOA: 02/26/2014 PCP: Gilford Rile, MD  Admit HPI / Brief Narrative: 73 yo female w/ hx of ESRD on dialysis M/W/F, recurrent C diff colitis, COPD, and chronic Afib who was transfered from [place] for sob and cough for several days. No fevers. No n/v/d. No swelling. On arrival to [facility] her oxygen sats were 81% on RA, but improved to 91% on 2 Liters. Her wbc 14, cxr revealed asympetric edema vs atypical pna. She was given albuterol and cefepime along with steroids.   HPI/Subjective: Pt seen for f/u visit.   Assessment/Plan:  Acute hypoxic respiratory failure  COPD Gold II  On chronic home oxygen  Possible HCAP  ESRD on M/W/F HD  Chronic Afib   DM2  PVD s/p B CEAs 2011  Code Status: FULL Family Communication: no family present at time of exam Disposition Plan: SDU  Consultants: none  Procedures: none   Antibiotics: maxipime 5/31 >> Vancomycin 5/31 >>  DVT prophylaxis: SQ heparin  Objective: Blood pressure 145/88, pulse 98, temperature 98 F (36.7 C), temperature source Oral, resp. rate 23, height 5\' 4"  (1.626 m), weight 81.1 kg (178 lb 12.7 oz), SpO2 91.00%.  Intake/Output Summary (Last 24 hours) at 02/26/14 0825 Last data filed at 02/26/14 0540  Gross per 24 hour  Intake    500 ml  Output      0 ml  Net    500 ml   Exam: F/U exam completed  Data Reviewed: Basic Metabolic Panel:  Recent Labs Lab 02/26/14 0555  NA 139  K 5.4*  CL 96  CO2 28  GLUCOSE 165*  BUN 47*  CREATININE 7.09*  CALCIUM 9.0   Liver Function Tests: No results found for this basename: AST, ALT, ALKPHOS, BILITOT, PROT, ALBUMIN,  in the last 168 hours No results found for this basename: LIPASE, AMYLASE,  in the last 168 hours No results found for this basename: AMMONIA,  in the last 168 hours  CBC:  Recent Labs Lab 02/26/14 0555  WBC 14.8*  NEUTROABS 14.1*  HGB 9.1*  HCT  30.9*  MCV 111.6*  PLT 211   Cardiac Enzymes: No results found for this basename: CKTOTAL, CKMB, CKMBINDEX, TROPONINI,  in the last 168 hours  BNP (last 3 results)  Recent Labs  04/13/13 1644 08/27/13 2220  PROBNP 12692.0* 14565.0*   CBG: No results found for this basename: GLUCAP,  in the last 168 hours  No results found for this or any previous visit (from the past 240 hour(s)).   Studies:  Recent x-ray studies have been reviewed in detail by the Attending Physician  Scheduled Meds:  Scheduled Meds: . [START ON 02/27/2014] ceFEPime (MAXIPIME) IV  2 g Intravenous Q M,W,F-1800  . heparin  5,000 Units Subcutaneous 3 times per day  . sodium chloride  3 mL Intravenous Q12H  . [START ON 02/27/2014] vancomycin  1,000 mg Intravenous Q M,W,F-HD    Time spent on care of this patient: 25+ mins   Cherene Altes , MD   [facility] Office  [phone] Pager - Text Page per Shea Evans as per below:  On-Call/Text Page:      [email or web address]      password [email or web address]  If 7PM-7AM, please contact night-coverage [email or web address] Password [email or web address] 02/26/2014, 8:25 AM   LOS: 0 days

## 2014-02-26 NOTE — Significant Event (Signed)
Rapid Response Event Note  Overview: Time Called: 0833 Arrival Time: 0835 Event Type: Respiratory  Initial Focused Assessment:  Called by primary Rn for patient with Respiratory distress.  Upon my arrival to patients room, RN and respiratory therapist in room.  Patient sitting on side of bed with increased WOB, SOB, and diaphoretic on nasal cannula 5 lpm.  Breath Sounds wheezes and Rhonchi   Interventions:  Breathing treatment given, ABG done.  Dr. Thereasa Solo at bedside.  118 hr, RR 33, 145/88 SPO2 96%.  Bipap requested   Event Summary:  Rn to call if assistance needed   at      at          [facility]

## 2014-02-26 NOTE — Progress Notes (Signed)
Changed from Servo I to v60

## 2014-02-27 LAB — RENAL FUNCTION PANEL
Albumin: 3 g/dL — ABNORMAL LOW (ref 3.5–5.2)
BUN: 71 mg/dL — AB (ref 6–23)
CHLORIDE: 92 meq/L — AB (ref 96–112)
CO2: 23 mEq/L (ref 19–32)
CREATININE: 8.67 mg/dL — AB (ref 0.50–1.10)
Calcium: 8.9 mg/dL (ref 8.4–10.5)
GFR calc Af Amer: 5 mL/min — ABNORMAL LOW (ref >90)
GFR calc non Af Amer: 4 mL/min — ABNORMAL LOW (ref >90)
GLUCOSE: 186 mg/dL — AB (ref 70–99)
POTASSIUM: 6.4 meq/L — AB (ref 3.7–5.3)
Phosphorus: 5.8 mg/dL — ABNORMAL HIGH (ref 2.3–4.6)
Sodium: 137 mEq/L (ref 137–147)

## 2014-02-27 LAB — GLUCOSE, CAPILLARY
Glucose-Capillary: 155 mg/dL — ABNORMAL HIGH (ref 70–99)
Glucose-Capillary: 162 mg/dL — ABNORMAL HIGH (ref 70–99)
Glucose-Capillary: 261 mg/dL — ABNORMAL HIGH (ref 70–99)

## 2014-02-27 LAB — CBC
HEMATOCRIT: 27.3 % — AB (ref 36.0–46.0)
Hemoglobin: 8.3 g/dL — ABNORMAL LOW (ref 12.0–15.0)
MCH: 33.2 pg (ref 26.0–34.0)
MCHC: 30.4 g/dL (ref 30.0–36.0)
MCV: 109.2 fL — AB (ref 78.0–100.0)
Platelets: 205 10*3/uL (ref 150–400)
RBC: 2.5 MIL/uL — AB (ref 3.87–5.11)
RDW: 16.3 % — ABNORMAL HIGH (ref 11.5–15.5)
WBC: 15.8 10*3/uL — ABNORMAL HIGH (ref 4.0–10.5)

## 2014-02-27 LAB — LEGIONELLA ANTIGEN, URINE: Legionella Antigen, Urine: POSITIVE

## 2014-02-27 MED ORDER — METHYLPREDNISOLONE SODIUM SUCC 125 MG IJ SOLR
60.0000 mg | Freq: Two times a day (BID) | INTRAMUSCULAR | Status: DC
  Administered 2014-02-28: 60 mg via INTRAVENOUS
  Filled 2014-02-27 (×3): qty 0.96

## 2014-02-27 MED ORDER — LEVOFLOXACIN IN D5W 750 MG/150ML IV SOLN
750.0000 mg | Freq: Once | INTRAVENOUS | Status: DC
  Filled 2014-02-27: qty 150

## 2014-02-27 MED ORDER — ROPINIROLE HCL 1 MG PO TABS
2.0000 mg | ORAL_TABLET | Freq: Two times a day (BID) | ORAL | Status: DC
  Administered 2014-02-27 (×2): 2 mg via ORAL
  Administered 2014-02-28: 1 mg via ORAL
  Filled 2014-02-27 (×4): qty 2

## 2014-02-27 MED ORDER — SODIUM CHLORIDE 0.9 % IV SOLN: 62.5000 mg | INTRAVENOUS | Status: DC

## 2014-02-27 MED ORDER — DARBEPOETIN ALFA-POLYSORBATE 40 MCG/0.4ML IJ SOLN
40.0000 ug | INTRAMUSCULAR | Status: DC
  Administered 2014-02-27: 40 ug via INTRAVENOUS
  Filled 2014-02-27: qty 0.4

## 2014-02-27 MED ORDER — RENA-VITE PO TABS
1.0000 | ORAL_TABLET | Freq: Every day | ORAL | Status: DC
  Administered 2014-02-27: 1 via ORAL
  Filled 2014-02-27 (×2): qty 1

## 2014-02-27 MED ORDER — LEVOFLOXACIN IN D5W 500 MG/100ML IV SOLN: 500.0000 mg | INTRAVENOUS | Status: DC

## 2014-02-27 MED ORDER — ISOSORBIDE MONONITRATE ER 30 MG PO TB24
30.0000 mg | ORAL_TABLET | Freq: Every day | ORAL | Status: DC
  Administered 2014-02-27 – 2014-02-28 (×2): 30 mg via ORAL
  Filled 2014-02-27 (×2): qty 1

## 2014-02-27 MED ORDER — DARBEPOETIN ALFA-POLYSORBATE 40 MCG/0.4ML IJ SOLN
INTRAMUSCULAR | Status: AC
  Filled 2014-02-27: qty 0.4

## 2014-02-27 NOTE — Progress Notes (Signed)
**Note De-Identified  Obfuscation** RT note: patient removed from BIPAP per RN to eat breakfast and placed on 2L [place], patient tol. Well. BBS clr/dim. RT to cont. To monitor

## 2014-02-27 NOTE — Progress Notes (Signed)
CRITICAL VALUE ALERT  Critical value received:  Legionella in urine  Date of notification:  02/27/14  Time of notification:  1500  Critical value read back:yes  Nurse who received alert:  Emilia Beck, RN  MD notified (1st page): Dr Thereasa Solo  Time of first page:  1536  MD notified (2nd page):  Time of second page:  Responding MD:  Dr Thereasa Solo  Time MD responded:  1540

## 2014-02-27 NOTE — Progress Notes (Signed)
Critical lab value called from [facility], legionella in urine.  MD paged and notified.  Patient is stable at this time.  Will continue to monitor.

## 2014-02-27 NOTE — Consult Note (Signed)
[facility] Renal Consultation Note  Indication for Consultation:  Management of ESRD/hemodialysis; anemia, hypertension/volume and secondary hyperparathyroidism  HPI: [name] is a 73 y.o. female admitted yesterday evening  From [facility]  Hypoxia with  02 stats in 73s' with CXR at [facility] showing asympetrical edema vs atypical pna.She last had hd in [place] on Friday and has been leaving below her edw with decreased appetite. She has had a productive cough and sob for several days and on arrival to [place] her oxygen sats were 81% on RA. Improved to 91% on 2 Liters. She required Bipap this am but just finished Lunch off bipap on [place] and K of 6.4.   Past Medical History  Diagnosis Date  . Carotid artery occlusion     bilat CEA in 2012  . C. 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 Hypertension   . Atrial fibrillation April  2013  . Pulmonary hypertension   . Secondary hyperparathyroidism, renal   . COPD (chronic obstructive pulmonary disease)   . Hypercholesterolemia   . History of MI (myocardial infarction)     "they say I had 2 years ago" (08/20/2012)  . GERD (gastroesophageal reflux disease)   . History of pneumonia     "have it alot" (08/20/2012)  . Sleep apnea     "don't wear mask anymore"  . Type II diabetes mellitus   . History of blood transfusion 2013    "3 times so far in 2013:  4 pints one time; 2 pints another; ?# last time" (08/20/2012)  . Iron deficiency anemia   . Seizures 2012    after carotid surgery  . ESRD on dialysis     "[place]; Tues, Thurs; Sat"  . On home oxygen therapy     "24/7"   . Acute diverticulitis     Oct 2013, treated medically [facility], sigmoid diverticulitits  . CHF (congestive heart failure)   . Coronary artery disease   . Seizure disorder March 2011  . Myocardial infarction   . Renal cell carcinoma 2005?  Marland Kitchen Cancer of kidney 2005    Past Surgical History  Procedure Laterality  Date  . Nephrectomy  2005    Right, for Renal cell carcinoma  . Carotid endarterectomy  2012    bilaterally; Dr. Kellie Simmering  . Av fistula placement  Jan. 7, 2011    Right  upper arm by Dr. Kellie Simmering  . Hd catheter  Aug. 22, 2013    [facility]  . Appendectomy      childhood  . Abdominal hysterectomy  1971?  Marland Kitchen Coronary angioplasty with stent placement  1990's    "1 total"  . Coronary angioplasty  1990's  . Av fistula repair  2013    right upper arm  . Esophagogastroduodenoscopy N/A 12/29/2012    Procedure: ESOPHAGOGASTRODUODENOSCOPY (EGD);  Surgeon: Jeryl Columbia, MD;  Location: [facility] ENDOSCOPY;  Service: Endoscopy;  Laterality: N/A;  . Colonoscopy with propofol N/A 12/31/2012    Procedure: COLONOSCOPY WITH PROPOFOL;  Surgeon: Jeryl Columbia, MD;  Location: [facility] ENDOSCOPY;  Service: Endoscopy;  Laterality: N/A;  currently IP at [place] History  Problem Relation Age of Onset  . Cancer Mother     pancreatic  . Diabetes Mother   . Stroke Father   . Coronary artery disease Father   . Heart disease Father 44    Heart Disease before age 35  .  Hyperlipidemia Father   . Hypertension Father   . Heart attack Father   . Cancer Brother     Brain  . Kidney disease Brother     Kidney stones  . Emphysema Father     smoked  . Pancreatic cancer Mother    Social  = lives alone with plans to move in with  Daughter  And she   reports that she quit smoking about 10 years ago. Her smoking use included Cigarettes. She has a 67.5 pack-year smoking history. She has never used smokeless tobacco. She reports that she does not drink alcohol or use illicit drugs.   Allergies  Allergen Reactions  . Morphine And Related Nausea And Vomiting  . Lipitor [Atorvastatin] Other (See Comments)    Causes weakness and drowsiness  . Nsaids     Unknown reported by previous hospital    Prior to Admission medications   Medication Sig Start Date End Date Taking? Authorizing Provider  amiodarone (PACERONE)  400 MG tablet Take 400 mg by mouth daily.   Yes Historical Provider, MD  amLODipine (NORVASC) 10 MG tablet Take 1 tablet (10 mg total) by mouth daily. 08/31/13  Yes Barton Dubois, MD  aspirin 81 MG tablet Take 81 mg by mouth daily.   Yes Historical Provider, MD  calcium acetate (PHOSLO) 667 MG capsule Take 2,001 mg by mouth 3 (three) times daily with meals.   Yes Historical Provider, MD  ezetimibe (ZETIA) 10 MG tablet Take 10 mg by mouth at bedtime.    Yes Historical Provider, MD  furosemide (LASIX) 80 MG tablet Take 160 mg by mouth daily.  09/21/13  Yes Historical Provider, MD  glipiZIDE (GLUCOTROL XL) 2.5 MG 24 hr tablet Take 2.5 mg by mouth daily as needed. Take when sugar is 150 or greater   Yes Historical Provider, MD  ipratropium (ATROVENT HFA) 17 MCG/ACT inhaler Inhale 2 puffs into the lungs every 6 (six) hours as needed for wheezing.   Yes Historical Provider, MD  isosorbide mononitrate (IMDUR) 30 MG 24 hr tablet Take 30 mg by mouth daily.  02/21/14  Yes Historical Provider, MD  omeprazole (PRILOSEC) 40 MG capsule Take 40 mg by mouth daily.  02/21/14  Yes Historical Provider, MD  rOPINIRole (REQUIP) 2 MG tablet Take 2 mg by mouth 2 (two) times daily.   Yes Historical Provider, MD     Anti-infectives   Start     Dose/Rate Route Frequency Ordered Stop   02/28/14 1800  ceFEPIme (MAXIPIME) 1 g in dextrose 5 % 50 mL IVPB  Status:  Discontinued     1 g 100 mL/hr over 30 Minutes Intravenous Every T-Th-Sa (1800) 02/26/14 0437 02/26/14 0443   02/28/14 1800  ceFEPIme (MAXIPIME) 2 g in dextrose 5 % 50 mL IVPB  Status:  Discontinued     2 g 100 mL/hr over 30 Minutes Intravenous Every T-Th-Sa (1800) 02/26/14 0443 02/26/14 0500   02/27/14 1800  ceFEPIme (MAXIPIME) 2 g in dextrose 5 % 50 mL IVPB     2 g 100 mL/hr over 30 Minutes Intravenous Every M-W-F (1800) 02/26/14 0500 03/08/14 1759   02/27/14 1200  vancomycin (VANCOCIN) IVPB 1000 mg/200 mL premix     1,000 mg 200 mL/hr over 60 Minutes  Intravenous Every M-W-F (Hemodialysis) 02/26/14 0505     02/26/14 0515  vancomycin (VANCOCIN) 1,750 mg in sodium chloride 0.9 % 500 mL IVPB     1,750 mg 250 mL/hr over 120 Minutes Intravenous  Once 02/26/14 0505  02/26/14 0740      Results for orders placed during the hospital encounter of 02/26/14 (from the past 48 hour(s))  CULTURE, BLOOD (ROUTINE X 2)     Status: None   Collection Time    02/26/14  5:55 AM      Result Value Ref Range   Specimen Description BLOOD LEFT ARM     Special Requests BOTTLES DRAWN AEROBIC AND ANAEROBIC [place] EA     Culture  Setup Time       Value: 02/26/2014 14:18     Performed at [facility]   Culture       Value:        BLOOD CULTURE RECEIVED NO GROWTH TO DATE CULTURE WILL BE HELD FOR 5 DAYS BEFORE ISSUING A FINAL NEGATIVE REPORT     Performed at [facility]   Report Status PENDING    CBC WITH DIFFERENTIAL     Status: Abnormal   Collection Time    02/26/14  5:55 AM      Result Value Ref Range   WBC 14.8 (*) 4.0 - 10.5 K/uL   RBC 2.77 (*) 3.87 - 5.11 MIL/uL   Hemoglobin 9.1 (*) 12.0 - 15.0 g/dL   HCT 30.9 (*) 36.0 - 46.0 %   MCV 111.6 (*) 78.0 - 100.0 fL   MCH 32.9  26.0 - 34.0 pg   MCHC 29.4 (*) 30.0 - 36.0 g/dL   RDW 16.7 (*) 11.5 - 15.5 %   Platelets 211  150 - 400 K/uL   Neutrophils Relative % 95 (*) 43 - 77 %   Lymphocytes Relative 4 (*) 12 - 46 %   Monocytes Relative 1 (*) 3 - 12 %   Eosinophils Relative 0  0 - 5 %   Basophils Relative 0  0 - 1 %   Neutro Abs 14.1 (*) 1.7 - 7.7 K/uL   Lymphs Abs 0.6 (*) 0.7 - 4.0 K/uL   Monocytes Absolute 0.1  0.1 - 1.0 K/uL   Eosinophils Absolute 0.0  0.0 - 0.7 K/uL   Basophils Absolute 0.0  0.0 - 0.1 K/uL   Smear Review MORPHOLOGY UNREMARKABLE    BASIC METABOLIC PANEL     Status: Abnormal   Collection Time    02/26/14  5:55 AM      Result Value Ref Range   Sodium 139  137 - 147 mEq/L   Potassium 5.4 (*) 3.7 - 5.3 mEq/L   Chloride 96  96 - 112 mEq/L   CO2 28  19 - 32 mEq/L    Glucose, Bld 165 (*) 70 - 99 mg/dL   BUN 47 (*) 6 - 23 mg/dL   Creatinine, Ser 7.09 (*) 0.50 - 1.10 mg/dL   Calcium 9.0  8.4 - 10.5 mg/dL   GFR calc non Af Amer 5 (*) >90 mL/min   GFR calc Af Amer 6 (*) >90 mL/min   Comment: (NOTE)     The eGFR has been calculated using the CKD EPI equation.     This calculation has not been validated in all clinical situations.     eGFR's persistently <90 mL/min signify possible Chronic Kidney     Disease.  CULTURE, BLOOD (ROUTINE X 2)     Status: None   Collection Time    02/26/14  6:04 AM      Result Value Ref Range   Specimen Description BLOOD LEFT HAND     Special Requests BOTTLES DRAWN AEROBIC AND ANAEROBIC 5CC EA  Culture  Setup Time       Value: 02/26/2014 14:17     Performed at [facility]   Culture       Value:        BLOOD CULTURE RECEIVED NO GROWTH TO DATE CULTURE WILL BE HELD FOR 5 DAYS BEFORE ISSUING A FINAL NEGATIVE REPORT     Performed at [facility]   Report Status PENDING    MRSA PCR SCREENING     Status: Abnormal   Collection Time    02/26/14  6:13 AM      Result Value Ref Range   MRSA by PCR POSITIVE (*) NEGATIVE   Comment:            The GeneXpert MRSA Assay (FDA     approved for NASAL specimens     only), is one component of a     comprehensive MRSA colonization     surveillance program. It is not     intended to diagnose MRSA     infection nor to guide or     monitor treatment for     MRSA infections.     RESULT CALLED TO, READ BACK BY AND VERIFIED WITH:     MUHURO,D RN 02/26/14 0918 [place] PNEUMONIAE URINARY ANTIGEN     Status: None   Collection Time    02/26/14  7:32 AM      Result Value Ref Range   Strep Pneumo Urinary Antigen NEGATIVE  NEGATIVE   Comment:            Infection due to S. pneumoniae     cannot be absolutely ruled out     since the antigen present     may be below the detection limit     of the test.  BLOOD GAS, ARTERIAL     Status: Abnormal   Collection Time     02/26/14  8:36 AM      Result Value Ref Range   O2 Content 5.0     Delivery systems NASAL CANNULA     pH, Arterial 7.188 (*) 7.350 - 7.450   Comment: CRITICAL RESULT CALLED TO, READ BACK BY AND VERIFIED WITH:     BECKY FLOWERS, RRT, AT 0843, BY JAMES JOHNSON RRT, RCP ON 02/26/2014   pCO2 arterial 73.9 (*) 35.0 - 45.0 mmHg   Comment: CRITICAL RESULT CALLED TO, READ BACK BY AND VERIFIED WITH:      BECKY FLOWERS, RRT, AT 0843, BY JAMES JOHNSON RRT, RCP ON 02/26/2014   pO2, Arterial 62.7 (*) 80.0 - 100.0 mmHg   Bicarbonate 27.0 (*) 20.0 - 24.0 mEq/L   TCO2 29.2  0 - 100 mmol/L   Acid-base deficit 0.4  0.0 - 2.0 mmol/L   O2 Saturation 85.0     Patient temperature 98.6     Collection site LEFT RADIAL     Drawn by [id number]     Sample type ARTERIAL     Allens test (pass/fail) PASS  PASS  BLOOD GAS, ARTERIAL     Status: Abnormal   Collection Time    02/26/14 11:18 AM      Result Value Ref Range   FIO2 0.40     Delivery systems VENTILATOR     Mode BILEVEL POSITIVE AIRWAY PRESSURE     Inspiratory PAP 12.0     Expiratory PAP 5.0     pH, Arterial 7.318 (*) 7.350 - 7.450   pCO2 arterial 49.3 (*) 35.0 - 45.0  mmHg   pO2, Arterial 142.0 (*) 80.0 - 100.0 mmHg   Bicarbonate 24.6 (*) 20.0 - 24.0 mEq/L   TCO2 26.1  0 - 100 mmol/L   Acid-base deficit 0.8  0.0 - 2.0 mmol/L   O2 Saturation 98.4     Patient temperature 98.6     Collection site LEFT RADIAL     Drawn by COLLECTED BY RT     Sample type ARTERIAL DRAW     Allens test (pass/fail) PASS  PASS  GLUCOSE, CAPILLARY     Status: Abnormal   Collection Time    02/26/14 11:49 AM      Result Value Ref Range   Glucose-Capillary 169 (*) 70 - 99 mg/dL   Comment 1 Documented in Chart     Comment 2 Notify RN    GLUCOSE, CAPILLARY     Status: Abnormal   Collection Time    02/26/14  3:40 PM      Result Value Ref Range   Glucose-Capillary 173 (*) 70 - 99 mg/dL  GLUCOSE, CAPILLARY     Status: Abnormal   Collection Time    02/26/14  4:40 PM       Result Value Ref Range   Glucose-Capillary 149 (*) 70 - 99 mg/dL  GLUCOSE, CAPILLARY     Status: Abnormal   Collection Time    02/26/14  9:45 PM      Result Value Ref Range   Glucose-Capillary 186 (*) 70 - 99 mg/dL  RENAL FUNCTION PANEL     Status: Abnormal   Collection Time    02/27/14  4:00 AM      Result Value Ref Range   Sodium 137  137 - 147 mEq/L   Potassium 6.4 (*) 3.7 - 5.3 mEq/L   Comment: HEMOLYSIS AT THIS LEVEL MAY AFFECT RESULT   Chloride 92 (*) 96 - 112 mEq/L   CO2 23  19 - 32 mEq/L   Glucose, Bld 186 (*) 70 - 99 mg/dL   BUN 71 (*) 6 - 23 mg/dL   Creatinine, Ser 8.67 (*) 0.50 - 1.10 mg/dL   Calcium 8.9  8.4 - 10.5 mg/dL   Phosphorus 5.8 (*) 2.3 - 4.6 mg/dL   Albumin 3.0 (*) 3.5 - 5.2 g/dL   GFR calc non Af Amer 4 (*) >90 mL/min   GFR calc Af Amer 5 (*) >90 mL/min   Comment: (NOTE)     The eGFR has been calculated using the CKD EPI equation.     This calculation has not been validated in all clinical situations.     eGFR's persistently <90 mL/min signify possible Chronic Kidney     Disease.  CBC     Status: Abnormal   Collection Time    02/27/14  4:00 AM      Result Value Ref Range   WBC 15.8 (*) 4.0 - 10.5 K/uL   RBC 2.50 (*) 3.87 - 5.11 MIL/uL   Hemoglobin 8.3 (*) 12.0 - 15.0 g/dL   HCT 27.3 (*) 36.0 - 46.0 %   MCV 109.2 (*) 78.0 - 100.0 fL   MCH 33.2  26.0 - 34.0 pg   MCHC 30.4  30.0 - 36.0 g/dL   RDW 16.3 (*) 11.5 - 15.5 %   Platelets 205  150 - 400 K/uL  GLUCOSE, CAPILLARY     Status: Abnormal   Collection Time    02/27/14  8:36 AM      Result Value Ref Range   Glucose-Capillary 155 (*)  70 - 99 mg/dL  GLUCOSE, CAPILLARY     Status: Abnormal   Collection Time    02/27/14 12:20 PM      Result Value Ref Range   Glucose-Capillary 162 (*) 70 - 99 mg/dL     ROS: see hpi history positives  Physical Exam: Filed Vitals:   02/27/14 1229  BP: 132/53  Pulse: 80  Temp: 97 F (36.1 C)  Resp: 17     General: Alert/ talkative, NAD HEENT: [place] MMM,  nonicteric Neck:  No jvd , supple Heart: RRR, no mur, or rub  Lungs: coarse  bilat Rhonchi, and non labored  Abdomen: Obese, bs =+ , soft , nontender Extremities: No Predal; edema Skin: No  overt rash ar pedal; u;cers Neuro: Ox3 , alert , moves all extrem  Dialysis Access:pos/ Bruit  RUA AVF  HD: MWF [place] 3h 6mn   80kg   2/2.25 [place]   Heparin none   RUA AVF  400/A1.5 Aranesp 40 mcg   Units IV/ weekly HD  Venofer 564mweekly hd  Assessment/Plan 1. SOB multifactoral = PNA/ Volume Incr. With Pulmonary edema/ COPD ( nonsmoker) 2. ESRD -  MWF HD K 6.4  Hd today./ fu am labs 3. Hypertension/volume  - Stable bp now / needs lower edw at dc 4. Anemia  - ESA AND Weekly iron  5. Metabolic bone disease -   Calcium Actate binder and no vitd on hd 6. Nutrition - carb mod renal diet  Limiting fluids  5 cups 7. HO chronic A fib- per admit team  DaErnest HaberPA-C [phone]/09/2013, 2:12 PM   Pt seen, examined and agree w A/P as above. ESRD patient on HD with SOB and hypoxemia , classic pulm edema on CXR.  Getting HD now , plan HD again tomorrow for further volume removal.  Will follow.  RoKelly SplinterD pager [phone]  cell [phone]/09/2013, 5:16 PM

## 2014-02-27 NOTE — Progress Notes (Signed)
[place] TEAM 1 - Stepdown/ICU TEAM Progress Note  [name] HWE:[id number] DOB: Jul 22, 1941 DOA: 02/26/2014 PCP: Gilford Rile, MD  Admit HPI / Brief Narrative: 73 yo female w/ hx of ESRD on dialysis M/W/F, recurrent C diff colitis, COPD, and chronic Afib who was transfered from [place] for sob and cough for several days. No fevers. No n/v/d. No swelling. On arrival to [facility] her oxygen sats were 81% on RA, but improved to 91% on 2 Liters. Her wbc was elevated at 14, and a cxr revealed asymmetric edema vs atypical pna. She was given albuterol and cefepime along with steroids.   HPI/Subjective: Pt states she feels better today, but not yet back to baseline.  Is somewhat tachypneic.  States she is "retainting more water" than usual.  Denies cp, n/v, or abdom pain.    Assessment/Plan:  Acute hypoxic respiratory failure Combination of HCAP, acute COPD exac, and moderate volume overload in ESRD  COPD Gold II - acute bronchospastic COPD exac  On chronic home oxygen - bronchospasm improving on current tx - using BIPAP prn so must remain in SDU   Possible HCAP On empiric abx tx  ESRD on M/W/F HD Nephrology was contacted via VM per admitting MD - pt due for HD today - confirmed via direct call that Nephrology aware pt needs HD   Hyperkalemia Due to ESRD - due for HD today   Chronic Afib  Rate controlled/appears to be in NSR at present - on chronic coumadin previously but had GIB April 2014 at which time was stopped - CHADSVAS score 4-5 - EP suggested resuming coumadin anticoag Dec 2014, but it is not clear if this was resumed or not - pt was not on anticoag at admit - will cover w/ full dose lovenox for now if afib recurs, and discuss chronic anticoag further w/ pt during this stay   DM2 CBG reasonably controlled at this time - follow closely w/ use of steroids  PVD s/p B CEAs 2011  MRSA screen +  Code Status: FULL Family Communication: no family present at time of  exam Disposition Plan: SDU  Consultants: Nephrology   Procedures: none   Antibiotics: maxipime 5/31 >> Vancomycin 5/31 >>  DVT prophylaxis: SQ heparin  Objective: Blood pressure 125/52, pulse 81, temperature 97.3 F (36.3 C), temperature source Oral, resp. rate 32, height 5\' 4"  (1.626 m), weight 84.4 kg (186 lb 1.1 oz), SpO2 100.00%.  Intake/Output Summary (Last 24 hours) at 02/27/14 1125 Last data filed at 02/26/14 2108  Gross per 24 hour  Intake    360 ml  Output    100 ml  Net    260 ml   Exam:  General: modest resp distress - some tachypnea - pauses between sentences  Lungs: diffuse exp wheezes - mild bibasilar crackles  Cardiovascular: Regular rate and rhythm without gallop or rub normal S1 and S2 Abdomen: Nontender, nondistended, soft, bowel sounds positive, no rebound, no ascites, no appreciable mass Extremities: No significant cyanosis, clubbing; 1+ edema bilateral lower extremities  Data Reviewed: Basic Metabolic Panel:  Recent Labs Lab 02/26/14 0555 02/27/14 0400  NA 139 137  K 5.4* 6.4*  CL 96 92*  CO2 28 23  GLUCOSE 165* 186*  BUN 47* 71*  CREATININE 7.09* 8.67*  CALCIUM 9.0 8.9  PHOS  --  5.8*   Liver Function Tests:  Recent Labs Lab 02/27/14 0400  ALBUMIN 3.0*   CBC:  Recent Labs Lab 02/26/14 0555 02/27/14 0400  WBC 14.8* 15.8*  NEUTROABS 14.1*  --   HGB 9.1* 8.3*  HCT 30.9* 27.3*  MCV 111.6* 109.2*  PLT 211 205   BNP (last 3 results)  Recent Labs  04/13/13 1644 08/27/13 2220  PROBNP 12692.0* 14565.0*   CBG:  Recent Labs Lab 02/26/14 1149 02/26/14 1540 02/26/14 1640 02/26/14 2145 02/27/14 0836  GLUCAP 169* 173* 149* 186* 155*    Recent Results (from the past 240 hour(s))  CULTURE, BLOOD (ROUTINE X 2)     Status: None   Collection Time    02/26/14  5:55 AM      Result Value Ref Range Status   Specimen Description BLOOD LEFT ARM   Final   Special Requests BOTTLES DRAWN AEROBIC AND ANAEROBIC [place] EA   Final    Culture  Setup Time     Final   Value: 02/26/2014 14:18     Performed at [facility]   Culture     Final   Value:        BLOOD CULTURE RECEIVED NO GROWTH TO DATE CULTURE WILL BE HELD FOR 5 DAYS BEFORE ISSUING A FINAL NEGATIVE REPORT     Performed at [facility]   Report Status PENDING   Incomplete  CULTURE, BLOOD (ROUTINE X 2)     Status: None   Collection Time    02/26/14  6:04 AM      Result Value Ref Range Status   Specimen Description BLOOD LEFT HAND   Final   Special Requests BOTTLES DRAWN AEROBIC AND ANAEROBIC 5CC EA   Final   Culture  Setup Time     Final   Value: 02/26/2014 14:17     Performed at [facility]   Culture     Final   Value:        BLOOD CULTURE RECEIVED NO GROWTH TO DATE CULTURE WILL BE HELD FOR 5 DAYS BEFORE ISSUING A FINAL NEGATIVE REPORT     Performed at [facility]   Report Status PENDING   Incomplete  MRSA PCR SCREENING     Status: Abnormal   Collection Time    02/26/14  6:13 AM      Result Value Ref Range Status   MRSA by PCR POSITIVE (*) NEGATIVE Final   Comment:            The GeneXpert MRSA Assay (FDA     approved for NASAL specimens     only), is one component of a     comprehensive MRSA colonization     surveillance program. It is not     intended to diagnose MRSA     infection nor to guide or     monitor treatment for     MRSA infections.     RESULT CALLED TO, READ BACK BY AND VERIFIED WITH:     MUHURO,D RN 02/26/14 6962 [place]    Studies:  Recent x-ray studies have been reviewed in detail by the Attending Physician  Scheduled Meds:  Scheduled Meds: . amiodarone  400 mg Oral Daily  . aspirin  81 mg Oral Daily  . budesonide (PULMICORT) nebulizer solution  0.25 mg Nebulization BID  . calcium acetate  2,001 mg Oral TID WC  . ceFEPime (MAXIPIME) IV  2 g Intravenous Q M,W,F-1800  . Chlorhexidine Gluconate Cloth  6 each Topical Q0600  . ezetimibe  10 mg Oral QHS  . furosemide  160 mg Oral Daily  .  heparin  5,000 Units Subcutaneous 3 times per  day  . insulin aspart  0-15 Units Subcutaneous TID WC  . insulin aspart  0-5 Units Subcutaneous QHS  . ipratropium-albuterol  3 mL Nebulization Q6H  . isosorbide mononitrate  30 mg Oral Daily  . methylPREDNISolone (SOLU-MEDROL) injection  60 mg Intravenous Q6H  . mupirocin ointment  1 application Nasal BID  . rOPINIRole  2 mg Oral BID  . vancomycin  1,000 mg Intravenous Q M,W,F-HD    Time spent on care of this patient: 35 mins  Cherene Altes , MD   [facility] Office  [phone] Pager - Text Page per Shea Evans as per below:  On-Call/Text Page:      [email or web address]      password [email or web address]  If 7PM-7AM, please contact night-coverage [email or web address] Password [email or web address] 02/27/2014, 11:25 AM   LOS: 1 day

## 2014-02-27 NOTE — Progress Notes (Signed)
ANTIBIOTIC CONSULT NOTE - INITIAL  Pharmacy Consult for Levaquin Indication: Legionella pneumonia  Allergies  Allergen Reactions  . Morphine And Related Nausea And Vomiting  . Lipitor [Atorvastatin] Other (See Comments)    Causes weakness and drowsiness  . Nsaids     Unknown reported by previous hospital    Patient Measurements: Height: 5\' 4"  (162.6 cm) Weight: 186 lb 1.1 oz (84.4 kg) IBW/kg (Calculated) : 54.7  Vital Signs: Temp: 97 F (36.1 C) (06/01 1229) Temp src: Oral (06/01 1229) BP: 132/53 mmHg (06/01 1229) Pulse Rate: 80 (06/01 1229) Intake/Output from previous day: 05/31 0701 - 06/01 0700 In: 600 [P.O.:600] Out: 200 [Urine:200] Intake/Output from this shift: Total I/O In: 240 [P.O.:240] Out: -   Labs:  Recent Labs  02/26/14 0555 02/27/14 0400  WBC 14.8* 15.8*  HGB 9.1* 8.3*  PLT 211 205  CREATININE 7.09* 8.67*   Estimated Creatinine Clearance: 6.2 ml/min (by C-G formula based on Cr of 8.67). No results found for this basename: VANCOTROUGH, Corlis Leak, VANCORANDOM, GENTTROUGH, GENTPEAK, GENTRANDOM, TOBRATROUGH, TOBRAPEAK, TOBRARND, AMIKACINPEAK, AMIKACINTROU, AMIKACIN,  in the last 72 hours   Microbiology: Recent Results (from the past 720 hour(s))  CULTURE, BLOOD (ROUTINE X 2)     Status: None   Collection Time    02/26/14  5:55 AM      Result Value Ref Range Status   Specimen Description BLOOD LEFT ARM   Final   Special Requests BOTTLES DRAWN AEROBIC AND ANAEROBIC [place] EA   Final   Culture  Setup Time     Final   Value: 02/26/2014 14:18     Performed at [facility]   Culture     Final   Value:        BLOOD CULTURE RECEIVED NO GROWTH TO DATE CULTURE WILL BE HELD FOR 5 DAYS BEFORE ISSUING A FINAL NEGATIVE REPORT     Performed at [facility]   Report Status PENDING   Incomplete  CULTURE, BLOOD (ROUTINE X 2)     Status: None   Collection Time    02/26/14  6:04 AM      Result Value Ref Range Status   Specimen Description BLOOD  LEFT HAND   Final   Special Requests BOTTLES DRAWN AEROBIC AND ANAEROBIC 5CC EA   Final   Culture  Setup Time     Final   Value: 02/26/2014 14:17     Performed at [facility]   Culture     Final   Value:        BLOOD CULTURE RECEIVED NO GROWTH TO DATE CULTURE WILL BE HELD FOR 5 DAYS BEFORE ISSUING A FINAL NEGATIVE REPORT     Performed at [facility]   Report Status PENDING   Incomplete  MRSA PCR SCREENING     Status: Abnormal   Collection Time    02/26/14  6:13 AM      Result Value Ref Range Status   MRSA by PCR POSITIVE (*) NEGATIVE Final   Comment:            The GeneXpert MRSA Assay (FDA     approved for NASAL specimens     only), is one component of a     comprehensive MRSA colonization     surveillance program. It is not     intended to diagnose MRSA     infection nor to guide or     monitor treatment for     MRSA infections.  RESULT CALLED TO, READ BACK BY AND VERIFIED WITH:     MUHURO,D RN 02/26/14 [place]    Medical History: Past Medical History  Diagnosis Date  . Carotid artery occlusion     bilat CEA in 2012  . C. 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 Hypertension   . Atrial fibrillation April  2013  . Pulmonary hypertension   . Secondary hyperparathyroidism, renal   . COPD (chronic obstructive pulmonary disease)   . Hypercholesterolemia   . History of MI (myocardial infarction)     "they say I had 2 years ago" (08/20/2012)  . GERD (gastroesophageal reflux disease)   . History of pneumonia     "have it alot" (08/20/2012)  . Sleep apnea     "don't wear mask anymore"  . Type II diabetes mellitus   . History of blood transfusion 2013    "3 times so far in 2013:  4 pints one time; 2 pints another; ?# last time" (08/20/2012)  . Iron deficiency anemia   . Seizures 2012    after carotid surgery  . ESRD on dialysis     "[place]; Tues, Thurs; Sat"  . On home oxygen therapy     "24/7"   . Acute diverticulitis      Oct 2013, treated medically [facility], sigmoid diverticulitits  . CHF (congestive heart failure)   . Coronary artery disease   . Seizure disorder March 2011  . Myocardial infarction   . Renal cell carcinoma 2005?  Marland Kitchen Cancer of kidney 2005    Medications:  Prescriptions prior to admission  Medication Sig Dispense Refill  . amiodarone (PACERONE) 400 MG tablet Take 400 mg by mouth daily.      Marland Kitchen amLODipine (NORVASC) 10 MG tablet Take 1 tablet (10 mg total) by mouth daily.  30 tablet  1  . aspirin 81 MG tablet Take 81 mg by mouth daily.      . calcium acetate (PHOSLO) 667 MG capsule Take 2,001 mg by mouth 3 (three) times daily with meals.      Marland Kitchen ezetimibe (ZETIA) 10 MG tablet Take 10 mg by mouth at bedtime.       . furosemide (LASIX) 80 MG tablet Take 160 mg by mouth daily.       Marland Kitchen glipiZIDE (GLUCOTROL XL) 2.5 MG 24 hr tablet Take 2.5 mg by mouth daily as needed. Take when sugar is 150 or greater      . ipratropium (ATROVENT HFA) 17 MCG/ACT inhaler Inhale 2 puffs into the lungs every 6 (six) hours as needed for wheezing.      . isosorbide mononitrate (IMDUR) 30 MG 24 hr tablet Take 30 mg by mouth daily.       Marland Kitchen omeprazole (PRILOSEC) 40 MG capsule Take 40 mg by mouth daily.       Marland Kitchen rOPINIRole (REQUIP) 2 MG tablet Take 2 mg by mouth 2 (two) times daily.       Assessment: 73 y/o female with ESRD who was admitted with SOB and cough started on vancomycin and cefepime for pneumonia. Urine Legionella antigen is positive. Pharmacy consulted to change therapy to Levaquin. She is afebrile and WBC are elevated.  Goal of Therapy:  Eradication of infection  Plan:  - Levaquin 750 mg IV then 500 mg IV q48h - Consider change to PO as patient improves  [facility], Pharm.D., BCPS Clinical Pharmacist Pager: [phone] 02/27/2014 4:14 PM

## 2014-02-28 DIAGNOSIS — N186 End stage renal disease: Secondary | ICD-10-CM

## 2014-02-28 DIAGNOSIS — J189 Pneumonia, unspecified organism: Secondary | ICD-10-CM | POA: Diagnosis present

## 2014-02-28 DIAGNOSIS — A481 Legionnaires' disease: Principal | ICD-10-CM

## 2014-02-28 DIAGNOSIS — I4891 Unspecified atrial fibrillation: Secondary | ICD-10-CM

## 2014-02-28 DIAGNOSIS — J810 Acute pulmonary edema: Secondary | ICD-10-CM

## 2014-02-28 DIAGNOSIS — J811 Chronic pulmonary edema: Secondary | ICD-10-CM

## 2014-02-28 DIAGNOSIS — J449 Chronic obstructive pulmonary disease, unspecified: Secondary | ICD-10-CM

## 2014-02-28 DIAGNOSIS — E875 Hyperkalemia: Secondary | ICD-10-CM

## 2014-02-28 LAB — BASIC METABOLIC PANEL
BUN: 52 mg/dL — AB (ref 6–23)
CALCIUM: 9 mg/dL (ref 8.4–10.5)
CO2: 25 meq/L (ref 19–32)
Chloride: 95 mEq/L — ABNORMAL LOW (ref 96–112)
Creatinine, Ser: 4.82 mg/dL — ABNORMAL HIGH (ref 0.50–1.10)
GFR calc Af Amer: 10 mL/min — ABNORMAL LOW (ref >90)
GFR calc non Af Amer: 8 mL/min — ABNORMAL LOW (ref >90)
GLUCOSE: 173 mg/dL — AB (ref 70–99)
Potassium: 5 mEq/L (ref 3.7–5.3)
SODIUM: 137 meq/L (ref 137–147)

## 2014-02-28 LAB — GLUCOSE, CAPILLARY: Glucose-Capillary: 210 mg/dL — ABNORMAL HIGH (ref 70–99)

## 2014-02-28 MED ORDER — LEVOFLOXACIN 500 MG PO TABS: 500.0000 mg | ORAL_TABLET | ORAL | Status: DC

## 2014-02-28 MED ORDER — LEVOFLOXACIN 750 MG PO TABS
750.0000 mg | ORAL_TABLET | ORAL | Status: AC
  Administered 2014-02-28: 750 mg via ORAL
  Filled 2014-02-28: qty 1

## 2014-02-28 MED ORDER — LEVOFLOXACIN 500 MG PO TABS: 500.0000 mg | ORAL_TABLET | Freq: Every day | ORAL | Status: DC

## 2014-02-28 MED ORDER — RENA-VITE PO TABS: 1.0000 | ORAL_TABLET | Freq: Every day | ORAL | Status: DC

## 2014-02-28 NOTE — Progress Notes (Signed)
  [facility] Progress Note   Subjective: No more SOB  Filed Vitals:   02/28/14 0830 02/28/14 0900 02/28/14 0930 02/28/14 0936  BP: 144/77 131/57 132/48   Pulse: 77 80 76 75  Temp:      TempSrc:      Resp: 14 22 18 19   Height:      Weight:      SpO2: 99% 100% 100% 97%   Exam: Alert, on HD , no distress No jvd Chest occ basilar rales, mostly clear today RRR no MRG Abd soft, obese, NTND No LE edema Neuro is nf, ox3 RUA AVF patent  HD: MWF [place]  3h 65min   80kg   2/2.25 [place]   Heparin none   RUA AVF   400/A1.5  Aranesp 40 mcg Units IV/ weekly HD Venofer 50mg  weekly hd        Assessment: 1 Dyspnea / hypoxemia- due to pulm edema primarily, question of PNA as well, underlying COPD also; improved 2 ESRD on HD 3 HTN/vol- lowered dry wt 4 Anemia no chg 5 HPTH no chg 6 Chronic afib 7 COPD on home O2  Plan- ok for dc after HD today, she is to still go to usual OP HD tomorrow    Kelly Splinter MD  pager [phone]    cell [phone]  02/28/2014, 9:57 AM     Recent Labs Lab 02/26/14 0555 02/27/14 0400 02/28/14 0700  NA 139 137 137  K 5.4* 6.4* 5.0  CL 96 92* 95*  CO2 28 23 25   GLUCOSE 165* 186* 173*  BUN 47* 71* 52*  CREATININE 7.09* 8.67* 4.82*  CALCIUM 9.0 8.9 9.0  PHOS  --  5.8*  --     Recent Labs Lab 02/27/14 0400  ALBUMIN 3.0*    Recent Labs Lab 02/26/14 0555 02/27/14 0400  WBC 14.8* 15.8*  NEUTROABS 14.1*  --   HGB 9.1* 8.3*  HCT 30.9* 27.3*  MCV 111.6* 109.2*  PLT 211 205   . amiodarone  400 mg Oral Daily  . aspirin  81 mg Oral Daily  . budesonide (PULMICORT) nebulizer solution  0.25 mg Nebulization BID  . calcium acetate  2,001 mg Oral TID WC  . Chlorhexidine Gluconate Cloth  6 each Topical Q0600  . darbepoetin (ARANESP) injection - DIALYSIS  40 mcg Intravenous Q Mon-HD  . ezetimibe  10 mg Oral QHS  . [START ON 03/01/2014] ferric gluconate (FERRLECIT/NULECIT) IV  62.5 mg Intravenous Q Wed-HD  . furosemide  160 mg Oral  Daily  . heparin  5,000 Units Subcutaneous 3 times per day  . insulin aspart  0-15 Units Subcutaneous TID WC  . insulin aspart  0-5 Units Subcutaneous QHS  . ipratropium-albuterol  3 mL Nebulization Q6H  . isosorbide mononitrate  30 mg Oral Daily  . [START ON 03/01/2014] levofloxacin (LEVAQUIN) IV  500 mg Intravenous Q48H  . levofloxacin (LEVAQUIN) IV  750 mg Intravenous Once  . methylPREDNISolone (SOLU-MEDROL) injection  60 mg Intravenous Q12H  . multivitamin  1 tablet Oral QHS  . mupirocin ointment  1 application Nasal BID  . rOPINIRole  2 mg Oral BID     albuterol

## 2014-02-28 NOTE — Discharge Summary (Signed)
Physician Discharge Summary  [name] ZSW:[id number] DOB: Nov 30, 1940 DOA: 02/26/2014  PCP: Gilford Rile, MD  Admit date: 02/26/2014 Discharge date: 02/28/2014  Time spent: 30 minutes  Recommendations for Outpatient Follow-up:  1. Keep usual dialysis appointments in [place] 2. Need to clarify after discharge with PCP if coumadin needs to be resumed. Coumadin stopped after GIB but EP doctor recommended resumption Dec 2014. Currently maintaining NSR.  Discharge Diagnoses:  Principal Problem:   Legionella pneumonia Active Problems:   CKD (chronic kidney disease) stage V requiring chronic dialysis   DM2 (diabetes mellitus, type 2)   Pulmonary HTN   Pulmonary edema-resolved   Anemia in chronic kidney disease   COPD (chronic obstructive pulmonary disease) on chronic oxygen   Discharge Condition: stable  Diet recommendation: Renal, carbohydrate modified  Filed Weights   02/28/14 0350 02/28/14 0715 02/28/14 1039  Weight: 175 lb 4.3 oz (79.5 kg) 176 lb 5.9 oz (80 kg) 170 lb 13.7 oz (77.5 kg)    History of present illness:  73 yo female w/ hx of ESRD on dialysis M/W/F, recurrent C diff colitis, COPD, and chronic Afib who was transfered from [place] for sob and cough for several days. No fevers. No n/v/d. No swelling. On arrival to [facility] her oxygen sats were 81% on RA, but improved to 91% on 2 Liters. Her wbc was elevated at 14, and a cxr revealed asymmetric edema vs atypical pna. She was given albuterol and cefepime along with steroids.   Hospital Course:  Acute on chronic hypoxic respiratory failure/acute pulmonary edema  Combination of HCAP, acute COPD exac, and moderate volume overload in ESRD -continue home oxygen 2-3 L  COPD Gold II - acute bronchospastic COPD exac /pulmonary HTN On chronic home oxygen - bronchospasm improving on current tx -after appropriate treatment for pneumonia and after 2 dialysis treatments she has returned to baseline-pt reports was NOT on  Norvasc prior to admit so this was dc'd- will continue nitrate  Legionella PNA/ HCAP Urine antigen for Legionella was positive. Antibiotics were narrowed to Levaquin 6/1  ESRD on M/W/F HD  Nephrology followed during this admission- has completed two days of HD and has been instructed by Nephrology to return to HD tomorrow (Wednesday)-no indication for Lasix since on HD so this was dc'd this admission  Hyperkalemia  Resolved after HD tx  Chronic Afib  Rate controlled/appears to be in NSR at present - on chronic coumadin previously but had GIB April 2014 at which time was stopped - CHADSVAS score 4-5 - EP suggested resuming coumadin anticoag Dec 2014, but it is not clear if this was resumed or not - pt was not on anticoag at admit - has remained in NSR so recommend PCP investigate this further after dc  DM2  CBG reasonably controlled at this time - follow closely w/ use of steroids   PVD s/p B CEAs 2011   Procedures:  None  Consultations:  Nephrology  Discharge Exam: Filed Vitals:   02/28/14 1205  BP: 115/47  Pulse: 74  Temp: 98.4 F (36.9 C)  Resp: 25   General: No respiratory distress Lungs: A few exp wheezes - mild faint bibasilar crackles -2 L Cardiovascular: Regular rate and rhythm without gallop or rub normal S1 and S2  Abdomen: Nontender, nondistended, soft, bowel sounds positive, no rebound, no ascites, no appreciable mass  Extremities: No significant cyanosis, clubbing; 1+ edema bilateral lower extremities   Discharge Instructions You were cared for by a hospitalist during your hospital stay.  If you have any questions about your discharge medications or the care you received while you were in the hospital after you are discharged, you can call the unit and asked to speak with the hospitalist on call if the hospitalist that took care of you is not available. Once you are discharged, your primary care physician will handle any further medical issues. Please note that  NO REFILLS for any discharge medications will be authorized once you are discharged, as it is imperative that you return to your primary care physician (or establish a relationship with a primary care physician if you do not have one) for your aftercare needs so that they can reassess your need for medications and monitor your lab values.     Medication List    STOP taking these medications       amLODipine 10 MG tablet  Commonly known as:  NORVASC     furosemide 80 MG tablet  Commonly known as:  LASIX      TAKE these medications       amiodarone 400 MG tablet  Commonly known as:  PACERONE  Take 400 mg by mouth daily.     aspirin 81 MG tablet  Take 81 mg by mouth daily.     calcium acetate 667 MG capsule  Commonly known as:  PHOSLO  Take 2,001 mg by mouth 3 (three) times daily with meals.     ezetimibe 10 MG tablet  Commonly known as:  ZETIA  Take 10 mg by mouth at bedtime.     glipiZIDE 2.5 MG 24 hr tablet  Commonly known as:  GLUCOTROL XL  Take 2.5 mg by mouth daily as needed. Take when sugar is 150 or greater     ipratropium 17 MCG/ACT inhaler  Commonly known as:  ATROVENT HFA  Inhale 2 puffs into the lungs every 6 (six) hours as needed for wheezing.     isosorbide mononitrate 30 MG 24 hr tablet  Commonly known as:  IMDUR  Take 30 mg by mouth daily.     levofloxacin 500 MG tablet  Commonly known as:  LEVAQUIN  Take 1 tablet (500 mg total) by mouth daily.     multivitamin Tabs tablet  Take 1 tablet by mouth at bedtime.     omeprazole 40 MG capsule  Commonly known as:  PRILOSEC  Take 40 mg by mouth daily.     rOPINIRole 2 MG tablet  Commonly known as:  REQUIP  Take 2 mg by mouth 2 (two) times daily.       Allergies  Allergen Reactions  . Morphine And Related Nausea And Vomiting  . Lipitor [Atorvastatin] Other (See Comments)    Causes weakness and drowsiness  . Nsaids     Unknown reported by previous hospital   Follow-up Information   Follow up  with Gilford Rile, MD. (Call to be seen in 1-3 weeks after discharge)    Specialty:  Internal Medicine   Contact information:   [place] [phone]       Please follow up. (Return to outpatient dialysis beginning Wednesday 6/3)        The results of significant diagnostics from this hospitalization (including imaging, microbiology, ancillary and laboratory) are listed below for reference.    Significant Diagnostic Studies: Dg Chest Port 1 View  02/26/2014   CLINICAL DATA:  Respiratory distress  EXAM: PORTABLE CHEST - 1 VIEW  COMPARISON:  02/25/2014  FINDINGS: Bilateral diffuse interstitial thickening and  prominence of the central pulmonary vasculature. Bilateral trace pleural effusions. No pneumothorax. Normal cardiomediastinal silhouette. Thoracic aortic atherosclerosis. Unremarkable osseous structures.  IMPRESSION: Bilateral trace pleural effusions and bilateral diffuse interstitial thickening which may reflect mild pulmonary edema versus infection.   Electronically Signed   By: Kathreen Devoid   On: 02/26/2014 09:44    Microbiology: Recent Results (from the past 240 hour(s))  CULTURE, BLOOD (ROUTINE X 2)     Status: None   Collection Time    02/26/14  5:55 AM      Result Value Ref Range Status   Specimen Description BLOOD LEFT ARM   Final   Special Requests BOTTLES DRAWN AEROBIC AND ANAEROBIC [place] EA   Final   Culture  Setup Time     Final   Value: 02/26/2014 14:18     Performed at [facility]   Culture     Final   Value:        BLOOD CULTURE RECEIVED NO GROWTH TO DATE CULTURE WILL BE HELD FOR 5 DAYS BEFORE ISSUING A FINAL NEGATIVE REPORT     Performed at [facility]   Report Status PENDING   Incomplete  CULTURE, BLOOD (ROUTINE X 2)     Status: None   Collection Time    02/26/14  6:04 AM      Result Value Ref Range Status   Specimen Description BLOOD LEFT HAND   Final   Special Requests BOTTLES DRAWN AEROBIC AND ANAEROBIC 5CC EA    Final   Culture  Setup Time     Final   Value: 02/26/2014 14:17     Performed at [facility]   Culture     Final   Value:        BLOOD CULTURE RECEIVED NO GROWTH TO DATE CULTURE WILL BE HELD FOR 5 DAYS BEFORE ISSUING A FINAL NEGATIVE REPORT     Performed at [facility]   Report Status PENDING   Incomplete  MRSA PCR SCREENING     Status: Abnormal   Collection Time    02/26/14  6:13 AM      Result Value Ref Range Status   MRSA by PCR POSITIVE (*) NEGATIVE Final   Comment:            The GeneXpert MRSA Assay (FDA     approved for NASAL specimens     only), is one component of a     comprehensive MRSA colonization     surveillance program. It is not     intended to diagnose MRSA     infection nor to guide or     monitor treatment for     MRSA infections.     RESULT CALLED TO, READ BACK BY AND VERIFIED WITH:     MUHURO,D RN 02/26/14 0918 [place]     Labs: Basic Metabolic Panel:  Recent Labs Lab 02/26/14 0555 02/27/14 0400 02/28/14 0700  NA 139 137 137  K 5.4* 6.4* 5.0  CL 96 92* 95*  CO2 28 23 25   GLUCOSE 165* 186* 173*  BUN 47* 71* 52*  CREATININE 7.09* 8.67* 4.82*  CALCIUM 9.0 8.9 9.0  PHOS  --  5.8*  --    Liver Function Tests:  Recent Labs Lab 02/27/14 0400  ALBUMIN 3.0*   No results found for this basename: LIPASE, AMYLASE,  in the last 168 hours No results found for this basename: AMMONIA,  in the last 168 hours CBC:  Recent Labs  Lab 02/26/14 0555 02/27/14 0400  WBC 14.8* 15.8*  NEUTROABS 14.1*  --   HGB 9.1* 8.3*  HCT 30.9* 27.3*  MCV 111.6* 109.2*  PLT 211 205   Cardiac Enzymes: No results found for this basename: CKTOTAL, CKMB, CKMBINDEX, TROPONINI,  in the last 168 hours BNP: BNP (last 3 results)  Recent Labs  04/13/13 1644 08/27/13 2220  PROBNP 12692.0* 14565.0*   CBG:  Recent Labs Lab 02/26/14 2145 02/27/14 0836 02/27/14 1220 02/27/14 2124 02/28/14 1215  GLUCAP 186* 155* 162* 261* 210*        Signed:  Samella Parr ANP [facility] 02/28/2014, 1:26 PM  Examined patient with ANP Ebony Hail, discussed assessment and plan and agree with the above plan. Discuss plan with patient and answered all questions. Greater than 35 minutes used in direct patient care and medical management of patient with multiple medical problems.

## 2014-02-28 NOTE — Procedures (Signed)
I was present at this dialysis session, have reviewed the session itself and made  appropriate changes  Kelly Splinter MD (pgr) [phone]    ([phone] 02/28/2014, 10:34 AM

## 2014-02-28 NOTE — Discharge Instructions (Signed)
Pneumonia, Adult Pneumonia is an infection of the lungs. It may be caused by a germ (virus or bacteria). Some types of pneumonia can spread easily from person to person. This can happen when you cough or sneeze. HOME CARE  Only take medicine as told by your doctor.  Take your medicine (antibiotics) as told. Finish it even if you start to feel better.  Do not smoke.  You may use a vaporizer or humidifier in your room. This can help loosen thick spit (mucus).  Sleep so you are almost sitting up (semi-upright). This helps reduce coughing.  Rest. A shot (vaccine) can help prevent pneumonia. Shots are often advised for:  People over 81 years old.  Patients on chemotherapy.  People with long-term (chronic) lung problems.  People with immune system problems. GET HELP RIGHT AWAY IF:   You are getting worse.  You cannot control your cough, and you are losing sleep.  You cough up blood.  Your pain gets worse, even with medicine.  You have a fever.  Any of your problems are getting worse, not better.  You have shortness of breath or chest pain. MAKE SURE YOU:   Understand these instructions.  Will watch your condition.  Will get help right away if you are not doing well or get worse. Document Released: 03/03/2008 Document Revised: 12/08/2011 Document Reviewed: 12/06/2010 [facility] Patient Information 2014 [place]. Legionella Antibody Assay This is a blood test used to detect antibodies to a bacteria called Legionella pneumophila in your blood. The test is used to help diagnose an infection called legionellosis, or Legionnaires' Disease. This organism also caused an illness called [place] fever. Detecting the antibody against this bacteria in your blood is the most common and easiest method for diagnosis. PREPARATION FOR TEST No preparation or fasting is necessary. NORMAL FINDINGS  IgM titer: =1:64  Combined IgG/IgA/Igm titer: <1:128 (<4-fold increase in antibody  titer) Ranges for normal findings may vary among different laboratories and hospitals. You should always check with your doctor after having lab work or other tests done to discuss the meaning of your test results and whether your values are considered within normal limits. MEANING OF TEST  Your caregiver will go over the test results with you and discuss the importance and meaning of your results, as well as treatment options and the need for additional tests if necessary. OBTAINING THE TEST RESULTS It is your responsibility to obtain your test results. Ask the lab or department performing the test when and how you will get your results. Document Released: 10/18/2004 Document Revised: 12/08/2011 Document Reviewed: 08/26/2008 [facility] Patient Information 2014 [place], [place].

## 2014-02-28 NOTE — Progress Notes (Signed)
Utilization Review Completed.Tansy Lorek T Dowell6/10/2013  

## 2014-03-04 LAB — CULTURE, BLOOD (ROUTINE X 2)
CULTURE: NO GROWTH
Culture: NO GROWTH

## 2014-04-19 ENCOUNTER — Emergency Department (HOSPITAL_COMMUNITY): Payer: Medicare Other

## 2014-04-19 ENCOUNTER — Encounter (HOSPITAL_COMMUNITY): Payer: Self-pay | Admitting: Emergency Medicine

## 2014-04-19 ENCOUNTER — Inpatient Hospital Stay (HOSPITAL_COMMUNITY)
Admission: EM | Admit: 2014-04-19 | Discharge: 2014-04-20 | DRG: 189 | Disposition: A | Discharge status: Home / Self Care | Payer: Medicare Other | Attending: Internal Medicine | Admitting: Internal Medicine

## 2014-04-19 DIAGNOSIS — G40909 Epilepsy, unspecified, not intractable, without status epilepticus: Secondary | ICD-10-CM | POA: Diagnosis present

## 2014-04-19 DIAGNOSIS — N186 End stage renal disease: Secondary | ICD-10-CM

## 2014-04-19 DIAGNOSIS — N039 Chronic nephritic syndrome with unspecified morphologic changes: Secondary | ICD-10-CM

## 2014-04-19 DIAGNOSIS — J449 Chronic obstructive pulmonary disease, unspecified: Secondary | ICD-10-CM | POA: Diagnosis present

## 2014-04-19 DIAGNOSIS — I447 Left bundle-branch block, unspecified: Secondary | ICD-10-CM | POA: Diagnosis present

## 2014-04-19 DIAGNOSIS — I252 Old myocardial infarction: Secondary | ICD-10-CM

## 2014-04-19 DIAGNOSIS — Z905 Acquired absence of kidney: Secondary | ICD-10-CM | POA: Diagnosis not present

## 2014-04-19 DIAGNOSIS — E7800 Pure hypercholesterolemia, unspecified: Secondary | ICD-10-CM | POA: Diagnosis present

## 2014-04-19 DIAGNOSIS — Z79899 Other long term (current) drug therapy: Secondary | ICD-10-CM | POA: Diagnosis not present

## 2014-04-19 DIAGNOSIS — R0602 Shortness of breath: Secondary | ICD-10-CM | POA: Diagnosis present

## 2014-04-19 DIAGNOSIS — R0609 Other forms of dyspnea: Secondary | ICD-10-CM

## 2014-04-19 DIAGNOSIS — K219 Gastro-esophageal reflux disease without esophagitis: Secondary | ICD-10-CM | POA: Diagnosis present

## 2014-04-19 DIAGNOSIS — Z9861 Coronary angioplasty status: Secondary | ICD-10-CM

## 2014-04-19 DIAGNOSIS — I509 Heart failure, unspecified: Secondary | ICD-10-CM | POA: Diagnosis present

## 2014-04-19 DIAGNOSIS — M949 Disorder of cartilage, unspecified: Secondary | ICD-10-CM

## 2014-04-19 DIAGNOSIS — N189 Chronic kidney disease, unspecified: Secondary | ICD-10-CM

## 2014-04-19 DIAGNOSIS — D631 Anemia in chronic kidney disease: Secondary | ICD-10-CM

## 2014-04-19 DIAGNOSIS — J9601 Acute respiratory failure with hypoxia: Secondary | ICD-10-CM

## 2014-04-19 DIAGNOSIS — I2789 Other specified pulmonary heart diseases: Secondary | ICD-10-CM | POA: Diagnosis present

## 2014-04-19 DIAGNOSIS — J209 Acute bronchitis, unspecified: Secondary | ICD-10-CM | POA: Diagnosis present

## 2014-04-19 DIAGNOSIS — Z833 Family history of diabetes mellitus: Secondary | ICD-10-CM

## 2014-04-19 DIAGNOSIS — J440 Chronic obstructive pulmonary disease with acute lower respiratory infection: Secondary | ICD-10-CM | POA: Diagnosis present

## 2014-04-19 DIAGNOSIS — J9620 Acute and chronic respiratory failure, unspecified whether with hypoxia or hypercapnia: Principal | ICD-10-CM | POA: Diagnosis present

## 2014-04-19 DIAGNOSIS — I428 Other cardiomyopathies: Secondary | ICD-10-CM | POA: Diagnosis present

## 2014-04-19 DIAGNOSIS — E669 Obesity, unspecified: Secondary | ICD-10-CM | POA: Diagnosis present

## 2014-04-19 DIAGNOSIS — J810 Acute pulmonary edema: Secondary | ICD-10-CM

## 2014-04-19 DIAGNOSIS — Z8249 Family history of ischemic heart disease and other diseases of the circulatory system: Secondary | ICD-10-CM

## 2014-04-19 DIAGNOSIS — E119 Type 2 diabetes mellitus without complications: Secondary | ICD-10-CM | POA: Diagnosis present

## 2014-04-19 DIAGNOSIS — Z85528 Personal history of other malignant neoplasm of kidney: Secondary | ICD-10-CM

## 2014-04-19 DIAGNOSIS — I120 Hypertensive chronic kidney disease with stage 5 chronic kidney disease or end stage renal disease: Secondary | ICD-10-CM | POA: Diagnosis present

## 2014-04-19 DIAGNOSIS — Z7982 Long term (current) use of aspirin: Secondary | ICD-10-CM

## 2014-04-19 DIAGNOSIS — R0603 Acute respiratory distress: Secondary | ICD-10-CM

## 2014-04-19 DIAGNOSIS — Z992 Dependence on renal dialysis: Secondary | ICD-10-CM | POA: Diagnosis not present

## 2014-04-19 DIAGNOSIS — Z87891 Personal history of nicotine dependence: Secondary | ICD-10-CM | POA: Diagnosis not present

## 2014-04-19 DIAGNOSIS — E785 Hyperlipidemia, unspecified: Secondary | ICD-10-CM | POA: Diagnosis present

## 2014-04-19 DIAGNOSIS — J9600 Acute respiratory failure, unspecified whether with hypoxia or hypercapnia: Secondary | ICD-10-CM

## 2014-04-19 DIAGNOSIS — Z9981 Dependence on supplemental oxygen: Secondary | ICD-10-CM

## 2014-04-19 DIAGNOSIS — N2581 Secondary hyperparathyroidism of renal origin: Secondary | ICD-10-CM | POA: Diagnosis present

## 2014-04-19 DIAGNOSIS — D509 Iron deficiency anemia, unspecified: Secondary | ICD-10-CM | POA: Diagnosis present

## 2014-04-19 DIAGNOSIS — Z90710 Acquired absence of both cervix and uterus: Secondary | ICD-10-CM

## 2014-04-19 DIAGNOSIS — I10 Essential (primary) hypertension: Secondary | ICD-10-CM | POA: Diagnosis present

## 2014-04-19 DIAGNOSIS — R0989 Other specified symptoms and signs involving the circulatory and respiratory systems: Secondary | ICD-10-CM

## 2014-04-19 DIAGNOSIS — I2510 Atherosclerotic heart disease of native coronary artery without angina pectoris: Secondary | ICD-10-CM | POA: Diagnosis present

## 2014-04-19 DIAGNOSIS — M899 Disorder of bone, unspecified: Secondary | ICD-10-CM | POA: Diagnosis present

## 2014-04-19 DIAGNOSIS — J441 Chronic obstructive pulmonary disease with (acute) exacerbation: Secondary | ICD-10-CM

## 2014-04-19 LAB — CBC
HEMATOCRIT: 30.8 % — AB (ref 36.0–46.0)
HEMOGLOBIN: 8.9 g/dL — AB (ref 12.0–15.0)
MCH: 31.2 pg (ref 26.0–34.0)
MCHC: 28.9 g/dL — AB (ref 30.0–36.0)
MCV: 108.1 fL — AB (ref 78.0–100.0)
Platelets: 209 10*3/uL (ref 150–400)
RBC: 2.85 MIL/uL — ABNORMAL LOW (ref 3.87–5.11)
RDW: 15.6 % — ABNORMAL HIGH (ref 11.5–15.5)
WBC: 11.9 10*3/uL — ABNORMAL HIGH (ref 4.0–10.5)

## 2014-04-19 LAB — I-STAT VENOUS BLOOD GAS, ED
Acid-Base Excess: 6 mmol/L — ABNORMAL HIGH (ref 0.0–2.0)
BICARBONATE: 32.3 meq/L — AB (ref 20.0–24.0)
O2 Saturation: 63 %
PCO2 VEN: 54 mmHg — AB (ref 45.0–50.0)
PH VEN: 7.386 — AB (ref 7.250–7.300)
PO2 VEN: 34 mmHg (ref 30.0–45.0)
TCO2: 34 mmol/L (ref 0–100)

## 2014-04-19 LAB — BASIC METABOLIC PANEL
Anion gap: 16 — ABNORMAL HIGH (ref 5–15)
BUN: 49 mg/dL — ABNORMAL HIGH (ref 6–23)
CHLORIDE: 93 meq/L — AB (ref 96–112)
CO2: 31 meq/L (ref 19–32)
CREATININE: 6.29 mg/dL — AB (ref 0.50–1.10)
Calcium: 8.9 mg/dL (ref 8.4–10.5)
GFR calc Af Amer: 7 mL/min — ABNORMAL LOW (ref >90)
GFR calc non Af Amer: 6 mL/min — ABNORMAL LOW (ref >90)
GLUCOSE: 103 mg/dL — AB (ref 70–99)
Potassium: 4.5 mEq/L (ref 3.7–5.3)
Sodium: 140 mEq/L (ref 137–147)

## 2014-04-19 LAB — MRSA PCR SCREENING: MRSA by PCR: POSITIVE — AB

## 2014-04-19 LAB — HEMOGLOBIN A1C
Hgb A1c MFr Bld: 4.7 % (ref <5.7)
Mean Plasma Glucose: 88 mg/dL (ref <117)

## 2014-04-19 LAB — GLUCOSE, CAPILLARY
Glucose-Capillary: 199 mg/dL — ABNORMAL HIGH (ref 70–99)
Glucose-Capillary: 184 mg/dL — ABNORMAL HIGH (ref 70–99)
Glucose-Capillary: 102 mg/dL — ABNORMAL HIGH (ref 70–99)

## 2014-04-19 LAB — I-STAT TROPONIN, ED: Troponin i, poc: 0.07 ng/mL (ref 0.00–0.08)

## 2014-04-19 LAB — I-STAT CG4 LACTIC ACID, ED: Lactic Acid, Venous: 1.66 mmol/L (ref 0.5–2.2)

## 2014-04-19 LAB — PRO B NATRIURETIC PEPTIDE: Pro B Natriuretic peptide (BNP): 30133 pg/mL — ABNORMAL HIGH (ref 0–125)

## 2014-04-19 MED ORDER — IPRATROPIUM BROMIDE 0.02 % IN SOLN
0.5000 mg | Freq: Four times a day (QID) | RESPIRATORY_TRACT | Status: DC
Start: 2014-04-19 — End: 2014-04-19
  Administered 2014-04-19 (×3): 0.5 mg via RESPIRATORY_TRACT
  Filled 2014-04-19 (×2): qty 2.5

## 2014-04-19 MED ORDER — HEPARIN SODIUM (PORCINE) 5000 UNIT/ML IJ SOLN
5000.0000 [IU] | Freq: Three times a day (TID) | INTRAMUSCULAR | Status: DC
  Administered 2014-04-19 – 2014-04-20 (×3): 5000 [IU] via SUBCUTANEOUS
  Filled 2014-04-19 (×3): qty 1

## 2014-04-19 MED ORDER — LEVALBUTEROL HCL 1.25 MG/0.5ML IN NEBU
1.2500 mg | INHALATION_SOLUTION | Freq: Three times a day (TID) | RESPIRATORY_TRACT | Status: DC
  Administered 2014-04-20 (×2): 1.25 mg via RESPIRATORY_TRACT
  Filled 2014-04-19 (×4): qty 0.5

## 2014-04-19 MED ORDER — INSULIN ASPART 100 UNIT/ML ~~LOC~~ SOLN
0.0000 [IU] | Freq: Three times a day (TID) | SUBCUTANEOUS | Status: DC
  Administered 2014-04-19 – 2014-04-20 (×2): 2 [IU] via SUBCUTANEOUS

## 2014-04-19 MED ORDER — IPRATROPIUM BROMIDE 0.02 % IN SOLN
0.5000 mg | Freq: Three times a day (TID) | RESPIRATORY_TRACT | Status: DC
  Administered 2014-04-20 (×2): 0.5 mg via RESPIRATORY_TRACT
  Filled 2014-04-19 (×2): qty 2.5

## 2014-04-19 MED ORDER — RENA-VITE PO TABS
1.0000 | ORAL_TABLET | Freq: Every day | ORAL | Status: DC
  Filled 2014-04-19: qty 1

## 2014-04-19 MED ORDER — CALCIUM ACETATE 667 MG PO CAPS
1334.0000 mg | ORAL_CAPSULE | Freq: Three times a day (TID) | ORAL | Status: DC
  Administered 2014-04-20 (×2): 1334 mg via ORAL
  Filled 2014-04-19 (×6): qty 2

## 2014-04-19 MED ORDER — LEVOFLOXACIN IN D5W 500 MG/100ML IV SOLN: 500.0000 mg | INTRAVENOUS | Status: DC

## 2014-04-19 MED ORDER — HYDROCODONE-ACETAMINOPHEN 5-325 MG PO TABS
1.0000 | ORAL_TABLET | Freq: Four times a day (QID) | ORAL | Status: DC | PRN
  Administered 2014-04-19: 1 via ORAL
  Filled 2014-04-19 (×2): qty 1

## 2014-04-19 MED ORDER — FUROSEMIDE 10 MG/ML IJ SOLN
80.0000 mg | Freq: Once | INTRAMUSCULAR | Status: AC
  Administered 2014-04-19: 80 mg via INTRAVENOUS
  Filled 2014-04-19: qty 8

## 2014-04-19 MED ORDER — GUAIFENESIN-DM 100-10 MG/5ML PO SYRP
5.0000 mL | ORAL_SOLUTION | ORAL | Status: DC | PRN
  Filled 2014-04-19: qty 5

## 2014-04-19 MED ORDER — ONDANSETRON HCL 4 MG PO TABS
4.0000 mg | ORAL_TABLET | Freq: Four times a day (QID) | ORAL | Status: DC | PRN
  Filled 2014-04-19: qty 1

## 2014-04-19 MED ORDER — ACETAMINOPHEN 325 MG PO TABS
ORAL_TABLET | ORAL | Status: AC
  Filled 2014-04-19: qty 2

## 2014-04-19 MED ORDER — PANTOPRAZOLE SODIUM 40 MG PO TBEC
80.0000 mg | DELAYED_RELEASE_TABLET | Freq: Every day | ORAL | Status: DC
  Administered 2014-04-19 – 2014-04-20 (×2): 80 mg via ORAL
  Filled 2014-04-19 (×3): qty 2

## 2014-04-19 MED ORDER — BUDESONIDE 0.25 MG/2ML IN SUSP
0.2500 mg | Freq: Two times a day (BID) | RESPIRATORY_TRACT | Status: DC
Start: 2014-04-19 — End: 2014-04-20
  Administered 2014-04-19 – 2014-04-20 (×2): 0.25 mg via RESPIRATORY_TRACT
  Filled 2014-04-19 (×5): qty 2

## 2014-04-19 MED ORDER — METOCLOPRAMIDE HCL 5 MG PO TABS
5.0000 mg | ORAL_TABLET | Freq: Two times a day (BID) | ORAL | Status: DC
  Administered 2014-04-19 – 2014-04-20 (×3): 5 mg via ORAL
  Filled 2014-04-19 (×4): qty 1

## 2014-04-19 MED ORDER — DARBEPOETIN ALFA-POLYSORBATE 100 MCG/0.5ML IJ SOLN
80.0000 ug | INTRAMUSCULAR | Status: DC
  Administered 2014-04-19: 80 ug via INTRAVENOUS
  Filled 2014-04-19: qty 0.5

## 2014-04-19 MED ORDER — PREDNISONE 20 MG PO TABS
40.0000 mg | ORAL_TABLET | Freq: Every day | ORAL | Status: DC
  Administered 2014-04-19 – 2014-04-20 (×2): 40 mg via ORAL
  Filled 2014-04-19 (×3): qty 2

## 2014-04-19 MED ORDER — INSULIN ASPART 100 UNIT/ML ~~LOC~~ SOLN: 0.0000 [IU] | Freq: Every day | SUBCUTANEOUS | Status: DC

## 2014-04-19 MED ORDER — DARBEPOETIN ALFA-POLYSORBATE 100 MCG/0.5ML IJ SOLN
INTRAMUSCULAR | Status: AC
  Administered 2014-04-19: 80 ug via INTRAVENOUS
  Filled 2014-04-19: qty 0.5

## 2014-04-19 MED ORDER — ACETAMINOPHEN 650 MG RE SUPP: 650.0000 mg | Freq: Four times a day (QID) | RECTAL | Status: DC | PRN

## 2014-04-19 MED ORDER — IPRATROPIUM-ALBUTEROL 0.5-2.5 (3) MG/3ML IN SOLN
3.0000 mL | Freq: Once | RESPIRATORY_TRACT | Status: AC
  Administered 2014-04-19: 3 mL via RESPIRATORY_TRACT
  Filled 2014-04-19: qty 3

## 2014-04-19 MED ORDER — LEVOFLOXACIN IN D5W 750 MG/150ML IV SOLN
750.0000 mg | Freq: Once | INTRAVENOUS | Status: AC
  Administered 2014-04-19: 750 mg via INTRAVENOUS
  Filled 2014-04-19 (×2): qty 150

## 2014-04-19 MED ORDER — ONDANSETRON HCL 4 MG/2ML IJ SOLN: 4.0000 mg | Freq: Four times a day (QID) | INTRAMUSCULAR | Status: DC | PRN

## 2014-04-19 MED ORDER — METHYLPREDNISOLONE SODIUM SUCC 125 MG IJ SOLR
125.0000 mg | Freq: Once | INTRAMUSCULAR | Status: AC
  Administered 2014-04-19: 125 mg via INTRAVENOUS
  Filled 2014-04-19: qty 2

## 2014-04-19 MED ORDER — ACETAMINOPHEN 325 MG PO TABS
650.0000 mg | ORAL_TABLET | Freq: Four times a day (QID) | ORAL | Status: DC | PRN
  Administered 2014-04-19 – 2014-04-20 (×2): 650 mg via ORAL
  Filled 2014-04-19: qty 2

## 2014-04-19 MED ORDER — LEVALBUTEROL HCL 1.25 MG/0.5ML IN NEBU
1.2500 mg | INHALATION_SOLUTION | Freq: Four times a day (QID) | RESPIRATORY_TRACT | Status: DC
  Administered 2014-04-19 (×2): 1.25 mg via RESPIRATORY_TRACT
  Filled 2014-04-19 (×5): qty 0.5

## 2014-04-19 MED ORDER — HYDROMORPHONE HCL PF 1 MG/ML IJ SOLN: 1.0000 mg | INTRAMUSCULAR | Status: DC | PRN

## 2014-04-19 MED ORDER — ALBUTEROL (5 MG/ML) CONTINUOUS INHALATION SOLN
10.0000 mg/h | INHALATION_SOLUTION | RESPIRATORY_TRACT | Status: DC
  Administered 2014-04-19: 10 mg/h via RESPIRATORY_TRACT
  Filled 2014-04-19: qty 20

## 2014-04-19 MED ORDER — ALBUTEROL SULFATE (2.5 MG/3ML) 0.083% IN NEBU: 10.0000 mg | INHALATION_SOLUTION | Freq: Once | RESPIRATORY_TRACT | Status: DC

## 2014-04-19 MED ORDER — SODIUM CHLORIDE 0.9 % IJ SOLN
3.0000 mL | Freq: Two times a day (BID) | INTRAMUSCULAR | Status: DC
  Administered 2014-04-19 – 2014-04-20 (×3): 3 mL via INTRAVENOUS

## 2014-04-19 MED ORDER — ROPINIROLE HCL 1 MG PO TABS
1.0000 mg | ORAL_TABLET | Freq: Two times a day (BID) | ORAL | Status: DC
  Administered 2014-04-19 – 2014-04-20 (×2): 1 mg via ORAL
  Filled 2014-04-19 (×5): qty 1

## 2014-04-19 MED ORDER — FUROSEMIDE 80 MG PO TABS
160.0000 mg | ORAL_TABLET | Freq: Every day | ORAL | Status: DC
  Administered 2014-04-19 – 2014-04-20 (×2): 160 mg via ORAL
  Filled 2014-04-19 (×2): qty 2

## 2014-04-19 NOTE — ED Provider Notes (Addendum)
CSN: [id number]     Arrival date & time 04/19/14  0340 History   First MD Initiated Contact with Patient 04/19/14 0359     Chief Complaint  Patient presents with  . Shortness of Breath     (Consider location/radiation/quality/duration/timing/severity/associated sxs/prior Treatment) HPI 73 yo woman with multiple chronic medical problems including cardiomyopathy, ESRD, DM, CAD, PAD, pulmonary HTN, COPD, HLD, anemia, seizure and renal cell carcinoma (remotely). She wears 3L/m of O2 RTC.   She comes in from home via EMS with SOB and wheezing. She has a non-productive cough which developed today. No fever. No chest pain. She was last dialyzed on Monday (2d ago) and received a full run. Later that evening she went to [facility] ED for SOB and was treated with nebulized medication - presumably Albuterol. She felt better at the time of d/c and has been using nebs at home but, she has felt increasingly SOB and DOE throughout the day.   She has a home pulse ox and notes that her oxygen level drops into the low 80s when she ambulates 10 feet at home. She has been sleeping in a recliner but, managed to go to sleep and then awoke with SOB at 0130h. She received Albuterol en route and says she feels a minor improvement.   Last hospitlization was 07/2013.   Past Medical History  Diagnosis Date  . Carotid artery occlusion     bilat CEA in 2012  . C. 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 Hypertension   . Atrial fibrillation April  2013  . Pulmonary hypertension   . Secondary hyperparathyroidism, renal   . COPD (chronic obstructive pulmonary disease)   . Hypercholesterolemia   . History of MI (myocardial infarction)     "they say I had 2 years ago" (08/20/2012)  . GERD (gastroesophageal reflux disease)   . History of pneumonia     "have it alot" (08/20/2012)  . Sleep apnea     "don't wear mask anymore"  . Type II diabetes mellitus   . History of blood transfusion  2013    "3 times so far in 2013:  4 pints one time; 2 pints another; ?# last time" (08/20/2012)  . Iron deficiency anemia   . Seizures 2012    after carotid surgery  . ESRD on dialysis     "[place]; Tues, Thurs; Sat"  . On home oxygen therapy     "24/7"   . Acute diverticulitis     Oct 2013, treated medically [facility], sigmoid diverticulitits  . CHF (congestive heart failure)   . Coronary artery disease   . Seizure disorder March 2011  . Myocardial infarction   . Renal cell carcinoma 2005?  Marland Kitchen Cancer of kidney 2005   Past Surgical History  Procedure Laterality Date  . Nephrectomy  2005    Right, for Renal cell carcinoma  . Carotid endarterectomy  2012    bilaterally; Dr. Kellie Simmering  . Av fistula placement  Jan. 7, 2011    Right  upper arm by Dr. Kellie Simmering  . Hd catheter  Aug. 22, 2013    [facility]  . Appendectomy      childhood  . Abdominal hysterectomy  1971?  Marland Kitchen Coronary angioplasty with stent placement  1990's    "1 total"  . Coronary angioplasty  1990's  . Av fistula repair  2013    right upper arm  . Esophagogastroduodenoscopy N/A 12/29/2012  Procedure: ESOPHAGOGASTRODUODENOSCOPY (EGD);  Surgeon: Jeryl Columbia, MD;  Location: [facility] ENDOSCOPY;  Service: Endoscopy;  Laterality: N/A;  . Colonoscopy with propofol N/A 12/31/2012    Procedure: COLONOSCOPY WITH PROPOFOL;  Surgeon: Jeryl Columbia, MD;  Location: [facility] ENDOSCOPY;  Service: Endoscopy;  Laterality: N/A;  currently IP at [place] History  Problem Relation Age of Onset  . Cancer Mother     pancreatic  . Diabetes Mother   . Stroke Father   . Coronary artery disease Father   . Heart disease Father 27    Heart Disease before age 58  . Hyperlipidemia Father   . Hypertension Father   . Heart attack Father   . Cancer Brother     Brain  . Kidney disease Brother     Kidney stones  . Emphysema Father     smoked  . Pancreatic cancer Mother    History  Substance Use Topics  . Smoking status: Former  Smoker -- 1.50 packs/day for 45 years    Types: Cigarettes    Quit date: 09/30/2003  . Smokeless tobacco: Never Used  . Alcohol Use: No   OB History   Grav Para Term Preterm Abortions TAB SAB Ect Mult Living                 Review of Systems Ten point review of symptoms performed and is negative with the exception of symptoms noted above.    Allergies  Morphine and related; Lipitor; and Nsaids  Home Medications   Prior to Admission medications   Medication Sig Start Date End Date Taking? Authorizing Provider  amiodarone (PACERONE) 400 MG tablet Take 400 mg by mouth daily.    Historical Provider, MD  aspirin 81 MG tablet Take 81 mg by mouth daily.    Historical Provider, MD  calcium acetate (PHOSLO) 667 MG capsule Take 2,001 mg by mouth 3 (three) times daily with meals.    Historical Provider, MD  ezetimibe (ZETIA) 10 MG tablet Take 10 mg by mouth at bedtime.     Historical Provider, MD  glipiZIDE (GLUCOTROL XL) 2.5 MG 24 hr tablet Take 2.5 mg by mouth daily as needed. Take when sugar is 150 or greater    Historical Provider, MD  ipratropium (ATROVENT HFA) 17 MCG/ACT inhaler Inhale 2 puffs into the lungs every 6 (six) hours as needed for wheezing.    Historical Provider, MD  isosorbide mononitrate (IMDUR) 30 MG 24 hr tablet Take 30 mg by mouth daily.  02/21/14   Historical Provider, MD  levofloxacin (LEVAQUIN) 500 MG tablet Take 1 tablet (500 mg total) by mouth every other day. 03/02/14   Allie Bossier, MD  multivitamin (RENA-VIT) TABS tablet Take 1 tablet by mouth at bedtime. 02/28/14   Samella Parr, NP  omeprazole (PRILOSEC) 40 MG capsule Take 40 mg by mouth daily.  02/21/14   Historical Provider, MD  rOPINIRole (REQUIP) 2 MG tablet Take 2 mg by mouth 2 (two) times daily.    Historical Provider, MD   BP 156/62  Pulse 94  Temp(Src) 98.9 F (37.2 C) (Oral)  Resp 18  Ht 5\' 4"  (1.626 m)  Wt 172 lb (78.019 kg)  BMI 29.51 kg/m2  SpO2 98% Physical Exam Gen: well developed and well  nourished appearing Head: NCAT Eyes: PERL, EOMI Nose: no epistaixis or rhinorrhea Mouth/throat: mucosa is moist and pink Neck: supple, no stridor Lungs: Respiratory 28 per minute, diffuse wheezing, bibasilar rales most prominent over the  right base  CV: RRR, no murmur, extremities appear well perfused.  Abd: soft, notender, nondistended Back: no ttp, no cva ttp Skin: warm and dry Ext: Dialysis shunt in the right arm has good thrill and was without signs of infection, otherwise normal to inspection, no dependent edema Neuro: CN ii-xii grossly intact, no focal deficits Psyche; normal affect,  calm and cooperative.  ED Course  Procedures (including critical care time) Labs Review  Results for orders placed during the hospital encounter of 04/19/14 (from the past 24 hour(s))  BASIC METABOLIC PANEL     Status: Abnormal   Collection Time    04/19/14  4:10 AM      Result Value Ref Range   Sodium 140  137 - 147 mEq/L   Potassium 4.5  3.7 - 5.3 mEq/L   Chloride 93 (*) 96 - 112 mEq/L   CO2 31  19 - 32 mEq/L   Glucose, Bld 103 (*) 70 - 99 mg/dL   BUN 49 (*) 6 - 23 mg/dL   Creatinine, Ser 6.29 (*) 0.50 - 1.10 mg/dL   Calcium 8.9  8.4 - 10.5 mg/dL   GFR calc non Af Amer 6 (*) >90 mL/min   GFR calc Af Amer 7 (*) >90 mL/min   Anion gap 16 (*) 5 - 15  CBC     Status: Abnormal   Collection Time    04/19/14  4:10 AM      Result Value Ref Range   WBC 11.9 (*) 4.0 - 10.5 K/uL   RBC 2.85 (*) 3.87 - 5.11 MIL/uL   Hemoglobin 8.9 (*) 12.0 - 15.0 g/dL   HCT 30.8 (*) 36.0 - 46.0 %   MCV 108.1 (*) 78.0 - 100.0 fL   MCH 31.2  26.0 - 34.0 pg   MCHC 28.9 (*) 30.0 - 36.0 g/dL   RDW 15.6 (*) 11.5 - 15.5 %   Platelets 209  150 - 400 K/uL  PRO B NATRIURETIC PEPTIDE     Status: Abnormal   Collection Time    04/19/14  4:10 AM      Result Value Ref Range   Pro B Natriuretic peptide (BNP) 30133.0 (*) 0 - 125 pg/mL  I-STAT TROPOININ, ED     Status: None   Collection Time    04/19/14  4:22 AM       Result Value Ref Range   Troponin i, poc 0.07  0.00 - 0.08 ng/mL   Comment 3           I-STAT CG4 LACTIC ACID, ED     Status: None   Collection Time    04/19/14  4:25 AM      Result Value Ref Range   Lactic Acid, Venous 1.66  0.5 - 2.2 mmol/L  I-STAT VENOUS BLOOD GAS, ED     Status: Abnormal   Collection Time    04/19/14  4:41 AM      Result Value Ref Range   pH, Ven 7.386 (*) 7.250 - 7.300   pCO2, Ven 54.0 (*) 45.0 - 50.0 mmHg   pO2, Ven 34.0  30.0 - 45.0 mmHg   Bicarbonate 32.3 (*) 20.0 - 24.0 mEq/L   TCO2 34  0 - 100 mmol/L   O2 Saturation 63.0     Acid-Base Excess 6.0 (*) 0.0 - 2.0 mmol/L   Sample type VENOUS     Comment NOTIFIED PHYSICIAN     DG Chest Port 1 View (Final result)  Result time: 04/19/14 05:20:08  Final result by Rad Results In Interface (04/19/14 05:20:08)    Narrative:   CLINICAL DATA: Shortness of breath.  EXAM: PORTABLE CHEST - 1 VIEW  COMPARISON: Chest radiograph April 17, 2014  FINDINGS: The cardiac silhouette appears mildly enlarged, similar. Calcified aortic knob. Slightly increasing diffuse interstitial prominence, patchy left lower lobe airspace opacity in at least small left, minimal right pleural effusion. No pneumothorax.  Multiple EKG lines overlie the patient and may obscure subtle underlying pathology. Soft tissue planes and included osseous structures are nonsuspicious.  IMPRESSION: Stable cardiomegaly, slightly increasing interstitial prominence suggests pulmonary edema with patchy left lower lobe airspace opacity which may reflect atelectasis and/or confluent edema with left greater than right pleural effusions (minimal to small).   Electronically Signed By: Elon Alas On: 04/19/2014 05:20   EKG: sinus arrythmia, rate 98 bpm, new LBBB when compared to 12/14 study, left axis deviation, wide qrs, no acute ischemic changes identified.   CRITICAL CARE Performed by: Elyn Peers   Total critical care time:  85m  Critical care time was exclusive of separately billable procedures and treating other patients.  Critical care was necessary to treat or prevent imminent or life-threatening deterioration.  Critical care was time spent personally by me on the following activities: development of treatment plan with patient and/or surrogate as well as nursing, discussions with consultants, evaluation of patient's response to treatment, examination of patient, obtaining history from patient or surrogate, ordering and performing treatments and interventions, ordering and review of laboratory studies, ordering and review of radiographic studies, pulse oximetry and re-evaluation of patient's condition.  MDM   Patient with persistent tachypnea, subjective SOB and wheezing despite 1 hour Albuterol 5mg  neb, solumedrol. Respiratory distress is likely multifactorial and secondary to COPD exacerbation, volume overload with pulmonary edema and pulmonary hypertension.   Patient noted to have a new LBBB on today's EKG when compared to most recent from Dec 2014. However, she is without chest pain and troponin is wnl.   Case discussed with Dr. Alcario Drought who has accepted the patient to the Tele Unit.   Case discussed with Dr. Mercy Moore who will arrange urgent dialysis.    Elyn Peers, MD 04/19/14 [place], MD 04/19/14 [phone]

## 2014-04-19 NOTE — Procedures (Signed)
Patient was seen on dialysis and the procedure was supervised.  BFR 400  Via AVF BP is  115/58.   Patient appears to be tolerating treatment well  Aahana Elza A 04/19/2014

## 2014-04-19 NOTE — Progress Notes (Signed)
Patient took 2 tablets of her Requip 1 mg.  Family member has patient's bag of home medications at bedside.  RN educated patient on not taking her home medications.  Requip ordered for 2200 tonight will not be given.  Will continue to monitor patient.

## 2014-04-19 NOTE — ED Notes (Signed)
Pt arrives via EMS from home c/o respiratory distress x1 week. EMS reports expiratory wheezes present. Hx Dialysis (MWF), CHF and COPD. Pt was seen at [facility] on Monday. 170/90 HR 94, 94% on 4L [place] (chronically wears 3L [place]).

## 2014-04-19 NOTE — ED Notes (Signed)
Admit Doctor at bedside.  

## 2014-04-19 NOTE — Consult Note (Signed)
Indication for Consultation:  Management of ESRD/hemodialysis; anemia, hypertension/volume and secondary hyperparathyroidism  HPI: [name] is a 73 y.o. female who presented to the ED with complaints of SOB, worsening since Sunday. She receives HD MWF @ [facility]. Hx DM, HTN, COPD on chronic 3L o2. She last had HD Monday and went to the [facility] ED after, she was told she had fluid on her lungs, they discharged her and told to continue to sleep in her recliner. Last night she began wheezing and developed a nonproductive cough, so she came to the ED this am by EMS for evaluation. Last HD was Monday without complication, she does report sometimes she feels like fluid is pulled too fast and she doesn't feel good. Will arrange for HD today. Says SOB has been gradual- noted more DOE over last several weeks.  Feels better after breathing treatment and halfway through dialysis  Past Medical History  Diagnosis Date  . Carotid artery occlusion     bilat CEA in 2012  . C. 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 Hypertension   . Atrial fibrillation April  2013  . Pulmonary hypertension   . Secondary hyperparathyroidism, renal   . COPD (chronic obstructive pulmonary disease)   . Hypercholesterolemia   . History of MI (myocardial infarction)     "they say I had 2 years ago" (08/20/2012)  . GERD (gastroesophageal reflux disease)   . History of pneumonia     "have it alot" (08/20/2012)  . Sleep apnea     "don't wear mask anymore"  . Type II diabetes mellitus   . History of blood transfusion 2013    "3 times so far in 2013:  4 pints one time; 2 pints another; ?# last time" (08/20/2012)  . Iron deficiency anemia   . Seizures 2012    after carotid surgery  . ESRD on dialysis     "[place]; Tues, Thurs; Sat"  . On home oxygen therapy     "24/7"   . Acute diverticulitis     Oct 2013, treated medically [facility], sigmoid diverticulitits  . CHF (congestive heart failure)    . Coronary artery disease   . Seizure disorder March 2011  . Myocardial infarction   . Renal cell carcinoma 2005?  Marland Kitchen Cancer of kidney 2005   Past Surgical History  Procedure Laterality Date  . Nephrectomy  2005    Right, for Renal cell carcinoma  . Carotid endarterectomy  2012    bilaterally; Dr.  Simmering  . Av fistula placement  Jan. 7, 2011    Right  upper arm by Dr.  Simmering  . Hd catheter  Aug. 22, 2013    [facility]  . Appendectomy      childhood  . Abdominal hysterectomy  1971?  Marland Kitchen Coronary angioplasty with stent placement  1990's    "1 total"  . Coronary angioplasty  1990's  . Av fistula repair  2013    right upper arm  . Esophagogastroduodenoscopy N/A 12/29/2012    Procedure: ESOPHAGOGASTRODUODENOSCOPY (EGD);  Surgeon: Jeryl Columbia, MD;  Location: [facility] ENDOSCOPY;  Service: Endoscopy;  Laterality: N/A;  . Colonoscopy with propofol N/A 12/31/2012    Procedure: COLONOSCOPY WITH PROPOFOL;  Surgeon: Jeryl Columbia, MD;  Location: [facility] ENDOSCOPY;  Service: Endoscopy;  Laterality: N/A;  currently IP at [place] History  Problem Relation Age of Onset  . Cancer Mother     pancreatic  . Diabetes  Mother   . Stroke Father   . Coronary artery disease Father   . Heart disease Father 38    Heart Disease before age 9  . Hyperlipidemia Father   . Hypertension Father   . Heart attack Father   . Cancer Brother     Brain  . Kidney disease Brother     Kidney stones  . Emphysema Father     smoked  . Pancreatic cancer Mother    Social History:  reports that she quit smoking about 10 years ago. Her smoking use included Cigarettes. She has a 67.5 pack-year smoking history. She has never used smokeless tobacco. She reports that she does not drink alcohol or use illicit drugs. Allergies  Allergen Reactions  . Morphine And Related Nausea And Vomiting  . Lipitor [Atorvastatin] Other (See Comments)    Causes weakness and drowsiness  . Nsaids     Unknown reported by previous  hospital   Prior to Admission medications   Medication Sig Start Date End Date Taking? Authorizing Provider  calcium acetate (PHOSLO) 667 MG capsule Take 1,334 mg by mouth 3 (three) times daily with meals.    Yes Historical Provider, MD  furosemide (LASIX) 80 MG tablet Take 160 mg by mouth daily.   Yes Historical Provider, MD  glipiZIDE (GLUCOTROL XL) 2.5 MG 24 hr tablet Take 2.5 mg by mouth daily as needed. Take when sugar is 150 or greater   Yes Historical Provider, MD  ipratropium (ATROVENT HFA) 17 MCG/ACT inhaler Inhale 2 puffs into the lungs every 6 (six) hours as needed for wheezing.   Yes Historical Provider, MD  levalbuterol (XOPENEX) 1.25 MG/3ML nebulizer solution Take 1.25 mg by nebulization every 4 (four) hours as needed for wheezing.   Yes Historical Provider, MD  metoCLOPramide (REGLAN) 5 MG tablet Take 5 mg by mouth 2 (two) times daily.   Yes Historical Provider, MD  multivitamin (RENA-VIT) TABS tablet Take 1 tablet by mouth at bedtime. 02/28/14  Yes Samella Parr, NP  omeprazole (PRILOSEC) 40 MG capsule Take 40 mg by mouth daily.  02/21/14  Yes Historical Provider, MD  rOPINIRole (REQUIP) 1 MG tablet Take 1 mg by mouth 2 (two) times daily.   Yes Historical Provider, MD   Current Facility-Administered Medications  Medication Dose Route Frequency Provider Last Rate Last Dose  . acetaminophen (TYLENOL) tablet 650 mg  650 mg Oral Q6H PRN Ripudeep Krystal Eaton, MD       Or  . acetaminophen (TYLENOL) suppository 650 mg  650 mg Rectal Q6H PRN Ripudeep K Rai, MD      . albuterol (PROVENTIL,VENTOLIN) solution continuous neb  10 mg/hr Nebulization Continuous Elyn Peers, MD 2 mL/hr at 04/19/14 0434 10 mg/hr at 04/19/14 0434  . budesonide (PULMICORT) nebulizer solution 0.25 mg  0.25 mg Nebulization BID Ripudeep K Rai, MD      . calcium acetate (PHOSLO) capsule 1,334 mg  1,334 mg Oral TID WC Ripudeep K Rai, MD      . furosemide (LASIX) tablet 160 mg  160 mg Oral Daily Ripudeep K Rai, MD      .  guaiFENesin-dextromethorphan (ROBITUSSIN DM) 100-10 MG/5ML syrup 5 mL  5 mL Oral Q4H PRN Ripudeep K Rai, MD      . heparin injection 5,000 Units  5,000 Units Subcutaneous 3 times per day Ripudeep Krystal Eaton, MD      . HYDROcodone-acetaminophen (NORCO/VICODIN) 5-325 MG per tablet 1 tablet  1 tablet Oral Q6H PRN Ripudeep Krystal Eaton, MD      .  HYDROmorphone (DILAUDID) injection 1 mg  1 mg Intravenous Q4H PRN Ripudeep K Rai, MD      . insulin aspart (novoLOG) injection 0-5 Units  0-5 Units Subcutaneous QHS Ripudeep K Rai, MD      . insulin aspart (novoLOG) injection 0-9 Units  0-9 Units Subcutaneous TID WC Ripudeep K Rai, MD      . ipratropium (ATROVENT) nebulizer solution 0.5 mg  0.5 mg Nebulization Q6H Ripudeep K Rai, MD      . levalbuterol (XOPENEX) nebulizer solution 1.25 mg  1.25 mg Nebulization 4 times per day Ripudeep Krystal Eaton, MD      . Derrill Memo ON 04/21/2014] levofloxacin (LEVAQUIN) IVPB 500 mg  500 mg Intravenous Q48H Lauren Bajbus, RPH      . levofloxacin (LEVAQUIN) IVPB 750 mg  750 mg Intravenous Once Lauren Bajbus, RPH      . metoCLOPramide (REGLAN) tablet 5 mg  5 mg Oral BID Ripudeep K Rai, MD      . multivitamin (RENA-VIT) tablet 1 tablet  1 tablet Oral QHS Ripudeep K Rai, MD      . ondansetron (ZOFRAN) tablet 4 mg  4 mg Oral Q6H PRN Ripudeep Krystal Eaton, MD       Or  . ondansetron (ZOFRAN) injection 4 mg  4 mg Intravenous Q6H PRN Ripudeep K Rai, MD      . pantoprazole (PROTONIX) EC tablet 80 mg  80 mg Oral Daily Ripudeep K Rai, MD      . predniSONE (DELTASONE) tablet 40 mg  40 mg Oral Q breakfast Ripudeep K Rai, MD      . rOPINIRole (REQUIP) tablet 1 mg  1 mg Oral BID Ripudeep K Rai, MD      . sodium chloride 0.9 % injection 3 mL  3 mL Intravenous Q12H Ripudeep Krystal Eaton, MD       Labs: Basic Metabolic Panel:  Recent Labs Lab 04/19/14 0410  NA 140  K 4.5  CL 93*  CO2 31  GLUCOSE 103*  BUN 49*  CREATININE 6.29*  CALCIUM 8.9   Liver Function Tests: No results found for this basename: AST, ALT,  ALKPHOS, BILITOT, PROT, ALBUMIN,  in the last 168 hours No results found for this basename: LIPASE, AMYLASE,  in the last 168 hours No results found for this basename: AMMONIA,  in the last 168 hours CBC:  Recent Labs Lab 04/19/14 0410  WBC 11.9*  HGB 8.9*  HCT 30.8*  MCV 108.1*  PLT 209   Cardiac Enzymes: No results found for this basename: CKTOTAL, CKMB, CKMBINDEX, TROPONINI,  in the last 168 hours CBG:  Recent Labs Lab 04/19/14 0843  GLUCAP 199*   Iron Studies: No results found for this basename: IRON, TIBC, TRANSFERRIN, FERRITIN,  in the last 72 hours Studies/Results: Dg Chest Port 1 View  04/19/2014   CLINICAL DATA:  Shortness of breath.  EXAM: PORTABLE CHEST - 1 VIEW  COMPARISON:  Chest radiograph April 17, 2014  FINDINGS: The cardiac silhouette appears mildly enlarged, similar. Calcified aortic knob. Slightly increasing diffuse interstitial prominence, patchy left lower lobe airspace opacity in at least small left, minimal right pleural effusion. No pneumothorax.  Multiple EKG lines overlie the patient and may obscure subtle underlying pathology. Soft tissue planes and included osseous structures are nonsuspicious.  IMPRESSION: Stable cardiomegaly, slightly increasing interstitial prominence suggests pulmonary edema with patchy left lower lobe airspace opacity which may reflect atelectasis and/or confluent edema with left greater than right pleural effusions (minimal to small).  Electronically Signed   By: Elon Alas   On: 04/19/2014 05:20    Review of Systems: Negative except for worsening sob since Sunday, dry cough and wheeze developing last night. Decreased appetite for months.   Physical Exam: Filed Vitals:   04/19/14 0515 04/19/14 0530 04/19/14 0715 04/19/14 0755  BP: 155/50 139/50 127/56 108/47  Pulse: 95 93 99 92  Temp:    98.2 F (36.8 C)  TempSrc:    Oral  Resp: 20 25 26 22   Height:      Weight:      SpO2: 100% 100% 94% 93%     General: Well  developed, well nourished, in no acute distress. Head: Normocephalic, atraumatic, sclera non-icteric, mucus membranes are moist Neck: Supple. JVD not elevated. Lungs: Bibasilar rales, scattered wheeze. unlabored Heart: RRR with S1 S2. No murmurs, rubs, or gallops appreciated. Abdomen: Soft, obese, non-tender, non-distended with normoactive bowel sounds. No rebound/guarding. No obvious abdominal masses. M-S:  Strength and tone appear normal for age. Lower extremities:without edema or ischemic changes, no open wounds  Neuro: Alert and oriented X 3. Moves all extremities spontaneously. Psych:  Responds to questions appropriately with a normal affect. Dialysis Access: R AVF +bruit/thrill  Dialysis Orders: MWF @ [facility] 76.5kgs  2K/2.25Ca 4hr No Heparin  R AVF 400/2.5    Aranesp 80 Q week Profile 2  Assessment/Plan: 1.  pulm edema/sob- per primary being treated for COPD exacerbation, prednisone, levaquin. pulm edema on xray. For HD today- will UF as able- may need lower EDW at discharge- I think just very little pulmonary reserve so need to try and keep as dry as possible 2.  ESRD -  MWF @ [place]. HD today. K+ 4.5- UF as able 3.  Hypertension/volume  - 103/47 will need lower EDW at DC.  4.  Anemia  - hgb 8.9- could be contributing . Cont aranesp q weds. No out Fe 5.  Metabolic bone disease -  Ca+ 8.9 continue phoslo 6.  Nutrition - renal diet. multivit 7. DM- per primary 8. COPD- on chronic 3L O2  Shelle Iron, NP [employer] [phone] 04/19/2014, 9:05 AM   Patient seen and examined, agree with above note with above modifications. 73 year old AF with COPD, obesity and ESRD- presenting with progressive gradual SOB- most likely multifactorial.  Being treated for COPD exacerbation and also will attempt to UF as able keeping as dry as possible Corliss Parish, MD 04/19/2014

## 2014-04-19 NOTE — Progress Notes (Signed)
ANTIBIOTIC CONSULT NOTE - INITIAL  Pharmacy Consult for levofloxacin Indication: COPD exacerbation  Allergies  Allergen Reactions  . Morphine And Related Nausea And Vomiting  . Lipitor [Atorvastatin] Other (See Comments)    Causes weakness and drowsiness  . Nsaids     Unknown reported by previous hospital    Patient Measurements: Height: 5\' 4"  (162.6 cm) Weight: 173 lb 11.6 oz (78.8 kg) IBW/kg (Calculated) : 54.7   Vital Signs: Temp: 97.5 F (36.4 C) (07/22 0956) Temp src: Oral (07/22 0956) BP: 117/61 mmHg (07/22 0956) Pulse Rate: 93 (07/22 0956) Intake/Output from previous day:   Intake/Output from this shift:    Labs:  Recent Labs  04/19/14 0410  WBC 11.9*  HGB 8.9*  PLT 209  CREATININE 6.29*   Estimated Creatinine Clearance: 8.2 ml/min (by C-G formula based on Cr of 6.29). No results found for this basename: VANCOTROUGH, VANCOPEAK, VANCORANDOM, GENTTROUGH, GENTPEAK, GENTRANDOM, TOBRATROUGH, TOBRAPEAK, TOBRARND, AMIKACINPEAK, AMIKACINTROU, AMIKACIN,  in the last 72 hours   Microbiology: No results found for this or any previous visit (from the past 720 hour(s)).  Medical History: Past Medical History  Diagnosis Date  . Carotid artery occlusion     bilat CEA in 2012  . C. 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 Hypertension   . Atrial fibrillation April  2013  . Pulmonary hypertension   . Secondary hyperparathyroidism, renal   . COPD (chronic obstructive pulmonary disease)   . Hypercholesterolemia   . History of MI (myocardial infarction)     "they say I had 2 years ago" (08/20/2012)  . GERD (gastroesophageal reflux disease)   . History of pneumonia     "have it alot" (08/20/2012)  . Sleep apnea     "don't wear mask anymore"  . Type II diabetes mellitus   . History of blood transfusion 2013    "3 times so far in 2013:  4 pints one time; 2 pints another; ?# last time" (08/20/2012)  . Iron deficiency anemia   . Seizures 2012   after carotid surgery  . ESRD on dialysis     "[place]; Tues, Thurs; Sat"  . On home oxygen therapy     "24/7"   . Acute diverticulitis     Oct 2013, treated medically [facility], sigmoid diverticulitits  . CHF (congestive heart failure)   . Coronary artery disease   . Seizure disorder March 2011  . Myocardial infarction   . Renal cell carcinoma 2005?  Marland Kitchen Cancer of kidney 2005    Assessment: 35 YOF admitted after not being able to breathe well x4 days PTA. She has nonproductive cough, WBC 11.9, afebrile. She has ESRD on HD MWF- to go for HD today.   Goal of Therapy:  Eradication of infection Proper antibiotic dosing based on hepatic and renal function  Plan:  1. Levofloxacin 750mg  IV x1 followed by 500mg  IV q48h for HD dosing 2. Follow c/s, clinical progression, LOT  Deane Melick D. Jemiah Ellenburg, PharmD, BCPS Clinical Pharmacist Pager: [phone] 04/19/2014 10:10 AM

## 2014-04-19 NOTE — Progress Notes (Signed)
Pt feeling tired. Does not feel like answering questions for admission assessment. Will try again at later time.  Carole Civil, RN

## 2014-04-19 NOTE — ED Notes (Signed)
Pt reports she has been sob since having dialysis on Monday. Pt went to [place] and was given neb tx and told she has fluid on her lungs, when asked what they advised her when she left she was told to sleep in her recliner. Pt reports not being able to make it from her recliner to the bathroom without having to stop and catch her breath. Pt denies any sob, or cp.

## 2014-04-19 NOTE — Progress Notes (Signed)
MRSA screen positive. Placed on precautions in Hemodialysis.

## 2014-04-19 NOTE — H&P (Signed)
History and Physical       Hospital Admission Note Date: 04/19/2014  Patient name: [name] Medical record number: [id number] Date of birth: August 28, 1941 Age: 73 y.o. Gender: female  PCP: Gilford Rile, MD    Chief Complaint:  Shortness of breath for last 4 days  HPI: Patient is a 73 year old female with multiple medical problems including chronic respiratory failure on 3 L O2, cardiomyopathy, ESRD on HD MWF, DM, CAD, PAD, pulmonary HTN, COPD, HLD, anemia presented to ED via EMS with shortness of breath and wheezing. Patient reported that she had her last dialysis on Monday 7/20 however she continued to feel short of breath. Last night she developed wheezing and nonproductive cough but no fevers or chills. She denies any chest pain she feels very congested. Patient reports that she received a full run of hemodialysis on Monday, had 2 L out, however later in the evening she went to [facility] ED with shortness of breath. Patient was treated for acute bronchitis with nebulizers, she felt somewhat better and was discharged home.  Patient has chronic respiratory failure, shortness breath with exertion, sleeps in a recliner, patient was given 1 hour long albuterol treatment with some improvement in the ED. Chest x-ray shows pulmonary edema. Nephrology was consulted by EDP.  Review of Systems:  Constitutional: Denies fever, chills, diaphoresis, poor appetite and +fatigue.  HEENT: Denies photophobia, eye pain, redness, hearing loss, ear pain, +congestion, sore throat, rhinorrhea, sneezing, mouth sores, trouble swallowing, neck pain, neck stiffness and tinnitus.   Respiratory: Please see history of present illness  Cardiovascular: Denies chest pain, palpitations and leg swelling.  Gastrointestinal: Denies nausea, vomiting, abdominal pain, diarrhea, constipation, blood in stool and abdominal distention.  Genitourinary: Denies dysuria,  urgency, frequency, hematuria, flank pain and difficulty urinating.  Musculoskeletal: Denies myalgias, back pain, joint swelling, arthralgias and gait problem.  Skin: Denies pallor, rash and wound.  Neurological: Denies dizziness, seizures, syncope, weakness, light-headedness, numbness and headaches.  Hematological: Denies adenopathy. Easy bruising, personal or family bleeding history  Psychiatric/Behavioral: Denies suicidal ideation, mood changes, confusion, nervousness, sleep disturbance and agitation  Past Medical History: Past Medical History  Diagnosis Date  . Carotid artery occlusion     bilat CEA in 2012  . C. 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 Hypertension   . Atrial fibrillation April  2013  . Pulmonary hypertension   . Secondary hyperparathyroidism, renal   . COPD (chronic obstructive pulmonary disease)   . Hypercholesterolemia   . History of MI (myocardial infarction)     "they say I had 2 years ago" (08/20/2012)  . GERD (gastroesophageal reflux disease)   . History of pneumonia     "have it alot" (08/20/2012)  . Sleep apnea     "don't wear mask anymore"  . Type II diabetes mellitus   . History of blood transfusion 2013    "3 times so far in 2013:  4 pints one time; 2 pints another; ?# last time" (08/20/2012)  . Iron deficiency anemia   . Seizures 2012    after carotid surgery  . ESRD on dialysis     "[place]; Tues, Thurs; Sat"  . On home oxygen therapy     "24/7"   . Acute diverticulitis     Oct 2013, treated medically [facility], sigmoid diverticulitits  . CHF (congestive heart failure)   . Coronary artery disease   . Seizure disorder March 2011  . Myocardial infarction   . Renal  cell carcinoma 2005?  Marland Kitchen Cancer of kidney 2005   Past Surgical History  Procedure Laterality Date  . Nephrectomy  2005    Right, for Renal cell carcinoma  . Carotid endarterectomy  2012    bilaterally; Dr. Kellie Simmering  . Av fistula placement  Jan. 7,  2011    Right  upper arm by Dr. Kellie Simmering  . Hd catheter  Aug. 22, 2013    [facility]  . Appendectomy      childhood  . Abdominal hysterectomy  1971?  Marland Kitchen Coronary angioplasty with stent placement  1990's    "1 total"  . Coronary angioplasty  1990's  . Av fistula repair  2013    right upper arm  . Esophagogastroduodenoscopy N/A 12/29/2012    Procedure: ESOPHAGOGASTRODUODENOSCOPY (EGD);  Surgeon: Jeryl Columbia, MD;  Location: [facility] ENDOSCOPY;  Service: Endoscopy;  Laterality: N/A;  . Colonoscopy with propofol N/A 12/31/2012    Procedure: COLONOSCOPY WITH PROPOFOL;  Surgeon: Jeryl Columbia, MD;  Location: [facility] ENDOSCOPY;  Service: Endoscopy;  Laterality: N/A;  currently IP at [facility] 3307    Medications: Prior to Admission medications   Medication Sig Start Date End Date Taking? Authorizing Provider  calcium acetate (PHOSLO) 667 MG capsule Take 1,334 mg by mouth 3 (three) times daily with meals.    Yes Historical Provider, MD  furosemide (LASIX) 80 MG tablet Take 160 mg by mouth daily.   Yes Historical Provider, MD  glipiZIDE (GLUCOTROL XL) 2.5 MG 24 hr tablet Take 2.5 mg by mouth daily as needed. Take when sugar is 150 or greater   Yes Historical Provider, MD  ipratropium (ATROVENT HFA) 17 MCG/ACT inhaler Inhale 2 puffs into the lungs every 6 (six) hours as needed for wheezing.   Yes Historical Provider, MD  levalbuterol (XOPENEX) 1.25 MG/3ML nebulizer solution Take 1.25 mg by nebulization every 4 (four) hours as needed for wheezing.   Yes Historical Provider, MD  metoCLOPramide (REGLAN) 5 MG tablet Take 5 mg by mouth 2 (two) times daily.   Yes Historical Provider, MD  multivitamin (RENA-VIT) TABS tablet Take 1 tablet by mouth at bedtime. 02/28/14  Yes Samella Parr, NP  omeprazole (PRILOSEC) 40 MG capsule Take 40 mg by mouth daily.  02/21/14  Yes Historical Provider, MD  rOPINIRole (REQUIP) 1 MG tablet Take 1 mg by mouth 2 (two) times daily.   Yes Historical Provider, MD    Allergies:   Allergies   Allergen Reactions  . Morphine And Related Nausea And Vomiting  . Lipitor [Atorvastatin] Other (See Comments)    Causes weakness and drowsiness  . Nsaids     Unknown reported by previous hospital    Social History:  reports that she quit smoking about 10 years ago. Her smoking use included Cigarettes. She has a 67.5 pack-year smoking history. She has never used smokeless tobacco. She reports that she does not drink alcohol or use illicit drugs.  Family History: Family History  Problem Relation Age of Onset  . Cancer Mother     pancreatic  . Diabetes Mother   . Stroke Father   . Coronary artery disease Father   . Heart disease Father 72    Heart Disease before age 29  . Hyperlipidemia Father   . Hypertension Father   . Heart attack Father   . Cancer Brother     Brain  . Kidney disease Brother     Kidney stones  . Emphysema Father     smoked  .  Pancreatic cancer Mother     Physical Exam: Blood pressure 127/56, pulse 99, temperature 98.9 F (37.2 C), temperature source Oral, resp. rate 26, height 5\' 4"  (1.626 m), weight 78.019 kg (172 lb), SpO2 94.00%. General: Alert, awake, oriented x3, in no acute distress. HEENT: normocephalic, atraumatic, anicteric sclera, pink conjunctiva, pupils equal and reactive to light and accomodation, oropharynx clear Neck: supple, no masses or lymphadenopathy, no goiter, no bruits  Heart: Regular rate and rhythm, without murmurs, rubs or gallops. Lungs: Lungs tight with scattered wheezing bilaterally, bibasilar rales  Abdomen: Obese, Soft, nontender, nondistended, positive bowel sounds, no masses. Extremities: No clubbing, cyanosis or edema with positive pedal pulses. Neuro: Grossly intact, no focal neurological deficits, strength 5/5 upper and lower extremities bilaterally Psych: alert and oriented x 3, normal mood and affect Skin: no rashes or lesions, warm and dry   LABS on Admission:  Basic Metabolic Panel:  Recent Labs Lab  04/19/14 0410  NA 140  K 4.5  CL 93*  CO2 31  GLUCOSE 103*  BUN 49*  CREATININE 6.29*  CALCIUM 8.9   Liver Function Tests: No results found for this basename: AST, ALT, ALKPHOS, BILITOT, PROT, ALBUMIN,  in the last 168 hours No results found for this basename: LIPASE, AMYLASE,  in the last 168 hours No results found for this basename: AMMONIA,  in the last 168 hours CBC:  Recent Labs Lab 04/19/14 0410  WBC 11.9*  HGB 8.9*  HCT 30.8*  MCV 108.1*  PLT 209   Cardiac Enzymes: No results found for this basename: CKTOTAL, CKMB, CKMBINDEX, TROPONINI,  in the last 168 hours BNP: No components found with this basename: POCBNP,  CBG: No results found for this basename: GLUCAP,  in the last 168 hours   Radiological Exams on Admission: Dg Chest Port 1 View  04/19/2014   CLINICAL DATA:  Shortness of breath.  EXAM: PORTABLE CHEST - 1 VIEW  COMPARISON:  Chest radiograph April 17, 2014  FINDINGS: The cardiac silhouette appears mildly enlarged, similar. Calcified aortic knob. Slightly increasing diffuse interstitial prominence, patchy left lower lobe airspace opacity in at least small left, minimal right pleural effusion. No pneumothorax.  Multiple EKG lines overlie the patient and may obscure subtle underlying pathology. Soft tissue planes and included osseous structures are nonsuspicious.  IMPRESSION: Stable cardiomegaly, slightly increasing interstitial prominence suggests pulmonary edema with patchy left lower lobe airspace opacity which may reflect atelectasis and/or confluent edema with left greater than right pleural effusions (minimal to small).   Electronically Signed   By: Elon Alas   On: 04/19/2014 05:20    Assessment/Plan Principal Problem:   Acute respiratory failure secondary to pulmonary edema and acute COPD exacerbation/acute bronchitis - Admit to telemetry, nephrology has been consulted by EDP, will arrange urgent hemodialysis, patient also received 80 mg IV Lasix in  ED - Place on scheduled Xopenex and Atrovent nebs, Pulmicort, prednisone 40 mg daily, Levaquin, O2 via nasal cannula   Active Problems:   DM2 (diabetes mellitus, type 2) - Obtain hemoglobin A1c, placed on sliding scale insulin    HTN (hypertension) - Currently stable    COPD (chronic obstructive pulmonary disease) exacerbation - As #1    ESRD (end stage renal disease) on dialysis - Continue hemodialysis, MW F., nephrology has been consulted  DVT prophylaxis: Heparin subcutaneous  CODE STATUS: Full code discussed with the patient  Family Communication: Admission, patients condition and plan of care including tests being ordered have been discussed with the patient and family  who indicates understanding and agree with the plan and Code Status   Further plan will depend as patient's clinical course evolves and further radiologic and laboratory data become available.   Time Spent on Admission: 1 hour  RAI,RIPUDEEP M.D. [facility] 04/19/2014, 7:38 AM Pager: [phone]  If 7PM-7AM, please contact night-coverage [email or web address] Password [email or web address]  **Disclaimer: This note was dictated with voice recognition software. Similar sounding words can inadvertently be transcribed and this note may contain transcription errors which may not have been corrected upon publication of note.**

## 2014-04-20 LAB — BASIC METABOLIC PANEL
Anion gap: 20 — ABNORMAL HIGH (ref 5–15)
BUN: 36 mg/dL — ABNORMAL HIGH (ref 6–23)
CALCIUM: 9.3 mg/dL (ref 8.4–10.5)
CHLORIDE: 93 meq/L — AB (ref 96–112)
CO2: 23 mEq/L (ref 19–32)
Creatinine, Ser: 3.89 mg/dL — ABNORMAL HIGH (ref 0.50–1.10)
GFR calc Af Amer: 12 mL/min — ABNORMAL LOW (ref >90)
GFR calc non Af Amer: 11 mL/min — ABNORMAL LOW (ref >90)
GLUCOSE: 121 mg/dL — AB (ref 70–99)
Potassium: 4.8 mEq/L (ref 3.7–5.3)
Sodium: 136 mEq/L — ABNORMAL LOW (ref 137–147)

## 2014-04-20 LAB — GLUCOSE, CAPILLARY
GLUCOSE-CAPILLARY: 112 mg/dL — AB (ref 70–99)
Glucose-Capillary: 154 mg/dL — ABNORMAL HIGH (ref 70–99)

## 2014-04-20 LAB — CBC
HCT: 30.8 % — ABNORMAL LOW (ref 36.0–46.0)
HEMOGLOBIN: 9.3 g/dL — AB (ref 12.0–15.0)
MCH: 31.5 pg (ref 26.0–34.0)
MCHC: 30.2 g/dL (ref 30.0–36.0)
MCV: 104.4 fL — AB (ref 78.0–100.0)
Platelets: UNDETERMINED 10*3/uL (ref 150–400)
RBC: 2.95 MIL/uL — ABNORMAL LOW (ref 3.87–5.11)
RDW: 15.5 % (ref 11.5–15.5)
WBC: 17.8 10*3/uL — ABNORMAL HIGH (ref 4.0–10.5)

## 2014-04-20 MED ORDER — LEVOFLOXACIN 500 MG PO TABS: 500.0000 mg | ORAL_TABLET | ORAL | Status: DC

## 2014-04-20 MED ORDER — CHLORHEXIDINE GLUCONATE CLOTH 2 % EX PADS
6.0000 | MEDICATED_PAD | Freq: Every day | CUTANEOUS | Status: DC
  Administered 2014-04-20: 6 via TOPICAL

## 2014-04-20 MED ORDER — BIOTENE DRY MOUTH MT LIQD
15.0000 mL | Freq: Two times a day (BID) | OROMUCOSAL | Status: DC
  Administered 2014-04-20: 15 mL via OROMUCOSAL

## 2014-04-20 MED ORDER — MUPIROCIN 2 % EX OINT
1.0000 "application " | TOPICAL_OINTMENT | Freq: Two times a day (BID) | CUTANEOUS | Status: DC
  Administered 2014-04-20: 1 via NASAL
  Filled 2014-04-20: qty 22

## 2014-04-20 MED ORDER — GUAIFENESIN-DM 100-10 MG/5ML PO SYRP: 5.0000 mL | ORAL_SOLUTION | ORAL | Status: DC | PRN

## 2014-04-20 MED ORDER — PREDNISONE (PAK) 10 MG PO TABS: ORAL_TABLET | ORAL | Status: DC

## 2014-04-20 NOTE — Progress Notes (Signed)
Subjective:  Had HD yesterday, removed 3800 tolerated well - feels much better wants to go home Objective Vital signs in last 24 hours: Filed Vitals:   04/19/14 2044 04/19/14 2053 04/20/14 0550 04/20/14 0900  BP: 136/70  153/74 125/69  Pulse: 88  84 80  Temp: 98.4 F (36.9 C)  97.7 F (36.5 C) 97.7 F (36.5 C)  TempSrc: Oral  Oral Oral  Resp: 18  18 17   Height:      Weight: 75.101 kg (165 lb 9.1 oz)     SpO2: 94% 94%  98%   Weight change: 0.781 kg (1 lb 11.6 oz)  Intake/Output Summary (Last 24 hours) at 04/20/14 1023 Last data filed at 04/20/14 0900  Gross per 24 hour  Intake   1323 ml  Output   3852 ml  Net  -2529 ml    Dialysis Orders: MWF @ [place]  76.5kgs 2K/2.25Ca 4hr No Heparin R AVF 400/2.5  Aranesp 80 Q week  Profile 2    Assessment/Plan:  1. pulm edema/sob- multifactorial due to COPD and  pulm edema. Better with treatment to date. Really improved and feels back to baseline 2. ESRD - MWF @ [facility]. HD yest with good volume removal. K+ 4.5- UF as able. will need lower EDW at discharge- I think just very little pulmonary reserve so need to try and keep as dry as possible.  Was taking lasix but not sure is appropriate for her- I discussed this, she will stop 3. Hypertension/volume - BP high enough will need lower EDW at DC. No BP meds 4. Anemia - hgb 8.9-9.3 could be contributing to symptoms. Cont aranesp q weds. No outpt Fe 5. Metabolic bone disease - Ca+ 8.9 continue phoslo 6. Nutrition - renal diet. multivit 7. DM- per primary 8. COPD- on chronic 3L O2 9. Dispo- possible discharge today ? No issues for discharge from my standpoint   [place]: Basic Metabolic Panel:  Recent Labs Lab 04/19/14 0410 04/20/14 0530  NA 140 136*  K 4.5 4.8  CL 93* 93*  CO2 31 23  GLUCOSE 103* 121*  BUN 49* 36*  CREATININE 6.29* 3.89*  CALCIUM 8.9 9.3   Liver Function Tests: No results found for this basename: AST, ALT, ALKPHOS, BILITOT, PROT,  ALBUMIN,  in the last 168 hours No results found for this basename: LIPASE, AMYLASE,  in the last 168 hours No results found for this basename: AMMONIA,  in the last 168 hours CBC:  Recent Labs Lab 04/19/14 0410 04/20/14 0530  WBC 11.9* 17.8*  HGB 8.9* 9.3*  HCT 30.8* 30.8*  MCV 108.1* 104.4*  PLT 209 PLATELET CLUMPS NOTED ON SMEAR, UNABLE TO ESTIMATE   Cardiac Enzymes: No results found for this basename: CKTOTAL, CKMB, CKMBINDEX, TROPONINI,  in the last 168 hours CBG:  Recent Labs Lab 04/19/14 0843 04/19/14 1617 04/19/14 2049 04/20/14 0747  GLUCAP 199* 184* 102* 112*    Iron Studies: No results found for this basename: IRON, TIBC, TRANSFERRIN, FERRITIN,  in the last 72 hours Studies/Results: Dg Chest Port 1 View  04/19/2014   CLINICAL DATA:  Shortness of breath.  EXAM: PORTABLE CHEST - 1 VIEW  COMPARISON:  Chest radiograph April 17, 2014  FINDINGS: The cardiac silhouette appears mildly enlarged, similar. Calcified aortic knob. Slightly increasing diffuse interstitial prominence, patchy left lower lobe airspace opacity in at least small left, minimal right pleural effusion. No pneumothorax.  Multiple EKG lines overlie the patient and may obscure subtle underlying  pathology. Soft tissue planes and included osseous structures are nonsuspicious.  IMPRESSION: Stable cardiomegaly, slightly increasing interstitial prominence suggests pulmonary edema with patchy left lower lobe airspace opacity which may reflect atelectasis and/or confluent edema with left greater than right pleural effusions (minimal to small).   Electronically Signed   By: Elon Alas   On: 04/19/2014 05:20   Medications: Infusions: . albuterol 10 mg/hr (04/19/14 0434)    Scheduled Medications: . budesonide  0.25 mg Nebulization BID  . calcium acetate  1,334 mg Oral TID WC  . darbepoetin (ARANESP) injection - DIALYSIS  80 mcg Intravenous Q Wed-HD  . furosemide  160 mg Oral Daily  . heparin  5,000 Units  Subcutaneous 3 times per day  . insulin aspart  0-5 Units Subcutaneous QHS  . insulin aspart  0-9 Units Subcutaneous TID WC  . ipratropium  0.5 mg Nebulization TID  . levalbuterol  1.25 mg Nebulization TID  . [START ON 04/21/2014] levofloxacin (LEVAQUIN) IV  500 mg Intravenous Q48H  . metoCLOPramide  5 mg Oral BID  . multivitamin  1 tablet Oral QHS  . pantoprazole  80 mg Oral Daily  . predniSONE  40 mg Oral Q breakfast  . rOPINIRole  1 mg Oral BID  . sodium chloride  3 mL Intravenous Q12H    have reviewed scheduled and prn medications.  Physical Exam: General: alert, sitting up in chair, no distress Heart: RRR Lungs: poor air movement likely baseline Abdomen: soft, non tender Extremities: no edema Dialysis Access: R AVF is patent    04/20/2014,10:23 AM  LOS: 1 day

## 2014-04-20 NOTE — Progress Notes (Signed)
Admission note: Late Entry 04/19/14  Arrival Method: Bed Mental Orientation: Alert and oriented X4 Telemetry: Yes  Assessment: See doc flow sheet Skin: Dry, intact IV: Left  forearm  Pain:Denies pain  Tubes: N/A Safety Measures: Fall risk interventions Fall Prevention Safety Plan: bed alarm, yellow socks, etc.  Admission Screening: Complete

## 2014-04-20 NOTE — Progress Notes (Signed)
Chaplain making rounds and stopped in to visit with [name]. She was in good spirits when I arrived. Chaplain engaged in empathetic listening and shared some spiritual conversation. She requested prayer toward the end of her visit that she can "recover and go home". She noted that the love from her family keeps her going and that she has very strong support. She expressed that she was very grateful for the visit.  Delford Field

## 2014-04-20 NOTE — Discharge Summary (Signed)
Physician Discharge Summary  [name] PJA:[id number] DOB: August 08, 1941 DOA: 04/19/2014  PCP: Gilford Rile, MD  Admit date: 04/19/2014 Discharge date: 04/20/2014  Time spent: 30 minutes  Recommendations for Outpatient Follow-up:  Follow up with renal as recommended. Discharge Diagnoses:  Principal Problem:   Acute respiratory failure Active Problems:   DM2 (diabetes mellitus, type 2)   Acute pulmonary edema   HTN (hypertension)   COPD (chronic obstructive pulmonary disease)   ESRD (end stage renal disease) on dialysis   Discharge Condition: improved.   Diet recommendation: renal diet   Filed Weights   04/19/14 0956 04/19/14 1357 04/19/14 2044  Weight: 78.8 kg (173 lb 11.6 oz) 75.1 kg (165 lb 9.1 oz) 75.101 kg (165 lb 9.1 oz)    History of present illness:   Patient is a 73 year old female with multiple medical problems including chronic respiratory failure on 3 L O2, cardiomyopathy, ESRD on HD MWF, DM, CAD, PAD, pulmonary HTN, COPD, HLD, anemia presented to ED via EMS with shortness of breath and wheezing. SHE WAS found to be fluid overloaded and in copd exacerbation.   Hospital Course:  Acute respiratory failure secondary to pulmonary edema and acute COPD exacerbation/acute bronchitis  - Admit to telemetry, nephrology has been consulted by EDP, underwent HD and fluid taken out she was also started on  Xopenex and Atrovent nebs, Pulmicort, prednisone 40 mg daily, Levaquin, O2 via nasal cannula . She reports all her symptoms resolved and no wheezing heard. She was discharged on levaquin and 3 more days of prednisone.   DM2 (diabetes mellitus, type 2)  - resume home medications.  HTN (hypertension)  - Currently stable  COPD (chronic obstructive pulmonary disease) exacerbation  - As #1  ESRD (end stage renal disease) on dialysis  - Continue hemodialysis, MW F., .     Procedures: HD Consultations: Renal  Discharge Exam: Filed Vitals:   04/20/14 0900  BP: 125/69   Pulse: 80  Temp: 97.7 F (36.5 C)  Resp: 17    General: alert afebrile comfortable Cardiovascular: s1s2  Respiratory: ctab, no wheezing or rhonchi  Discharge Instructions You were cared for by a hospitalist during your hospital stay. If you have any questions about your discharge medications or the care you received while you were in the hospital after you are discharged, you can call the unit and asked to speak with the hospitalist on call if the hospitalist that took care of you is not available. Once you are discharged, your primary care physician will handle any further medical issues. Please note that NO REFILLS for any discharge medications will be authorized once you are discharged, as it is imperative that you return to your primary care physician (or establish a relationship with a primary care physician if you do not have one) for your aftercare needs so that they can reassess your need for medications and monitor your lab values.  Discharge Instructions   Discharge instructions    Complete by:  As directed   Follow up with nephrology as recommended.            Medication List    STOP taking these medications       furosemide 80 MG tablet  Commonly known as:  LASIX      TAKE these medications       calcium acetate 667 MG capsule  Commonly known as:  PHOSLO  Take 1,334 mg by mouth 3 (three) times daily with meals.     glipiZIDE 2.5 MG 24  hr tablet  Commonly known as:  GLUCOTROL XL  Take 2.5 mg by mouth daily as needed. Take when sugar is 150 or greater     guaiFENesin-dextromethorphan 100-10 MG/5ML syrup  Commonly known as:  ROBITUSSIN DM  Take 5 mLs by mouth every 4 (four) hours as needed for cough.     ipratropium 17 MCG/ACT inhaler  Commonly known as:  ATROVENT HFA  Inhale 2 puffs into the lungs every 6 (six) hours as needed for wheezing.     levalbuterol 1.25 MG/3ML nebulizer solution  Commonly known as:  XOPENEX  Take 1.25 mg by nebulization every 4  (four) hours as needed for wheezing.     levofloxacin 500 MG tablet  Commonly known as:  LEVAQUIN  Take 1 tablet (500 mg total) by mouth every other day.     metoCLOPramide 5 MG tablet  Commonly known as:  REGLAN  Take 5 mg by mouth 2 (two) times daily.     multivitamin Tabs tablet  Take 1 tablet by mouth at bedtime.     omeprazole 40 MG capsule  Commonly known as:  PRILOSEC  Take 40 mg by mouth daily.     predniSONE 10 MG tablet  Commonly known as:  STERAPRED UNI-PAK  Prednisone 20 mg daily for 3 days and stop     rOPINIRole 1 MG tablet  Commonly known as:  REQUIP  Take 1 mg by mouth 2 (two) times daily.       Allergies  Allergen Reactions  . Morphine And Related Nausea And Vomiting  . Lipitor [Atorvastatin] Other (See Comments)    Causes weakness and drowsiness  . Nsaids     Unknown reported by previous hospital      The results of significant diagnostics from this hospitalization (including imaging, microbiology, ancillary and laboratory) are listed below for reference.    Significant Diagnostic Studies: Dg Chest Port 1 View  04/19/2014   CLINICAL DATA:  Shortness of breath.  EXAM: PORTABLE CHEST - 1 VIEW  COMPARISON:  Chest radiograph April 17, 2014  FINDINGS: The cardiac silhouette appears mildly enlarged, similar. Calcified aortic knob. Slightly increasing diffuse interstitial prominence, patchy left lower lobe airspace opacity in at least small left, minimal right pleural effusion. No pneumothorax.  Multiple EKG lines overlie the patient and may obscure subtle underlying pathology. Soft tissue planes and included osseous structures are nonsuspicious.  IMPRESSION: Stable cardiomegaly, slightly increasing interstitial prominence suggests pulmonary edema with patchy left lower lobe airspace opacity which may reflect atelectasis and/or confluent edema with left greater than right pleural effusions (minimal to small).   Electronically Signed   By: Elon Alas   On:  04/19/2014 05:20    Microbiology: Recent Results (from the past 240 hour(s))  MRSA PCR SCREENING     Status: Abnormal   Collection Time    04/19/14  8:46 AM      Result Value Ref Range Status   MRSA by PCR POSITIVE (*) NEGATIVE Final   Comment:            The GeneXpert MRSA Assay (FDA     approved for NASAL specimens     only), is one component of a     comprehensive MRSA colonization     surveillance program. It is not     intended to diagnose MRSA     infection nor to guide or     monitor treatment for     MRSA infections.     RESULT CALLED  TO, READ BACK BY AND VERIFIED WITH:     K. HAYWOOD RN 10:20 04/19/14 ([email or web address])     Labs: Basic Metabolic Panel:  Recent Labs Lab 04/19/14 0410 04/20/14 0530  NA 140 136*  K 4.5 4.8  CL 93* 93*  CO2 31 23  GLUCOSE 103* 121*  BUN 49* 36*  CREATININE 6.29* 3.89*  CALCIUM 8.9 9.3   Liver Function Tests: No results found for this basename: AST, ALT, ALKPHOS, BILITOT, PROT, ALBUMIN,  in the last 168 hours No results found for this basename: LIPASE, AMYLASE,  in the last 168 hours No results found for this basename: AMMONIA,  in the last 168 hours CBC:  Recent Labs Lab 04/19/14 0410 04/20/14 0530  WBC 11.9* 17.8*  HGB 8.9* 9.3*  HCT 30.8* 30.8*  MCV 108.1* 104.4*  PLT 209 PLATELET CLUMPS NOTED ON SMEAR, UNABLE TO ESTIMATE   Cardiac Enzymes: No results found for this basename: CKTOTAL, CKMB, CKMBINDEX, TROPONINI,  in the last 168 hours BNP: BNP (last 3 results)  Recent Labs  08/27/13 2220 04/19/14 0410  PROBNP 14565.0* 30133.0*   CBG:  Recent Labs Lab 04/19/14 0843 04/19/14 1617 04/19/14 2049 04/20/14 0747 04/20/14 1201  GLUCAP 199* 184* 102* 112* 154*       Signed:  Cisco Kindt  [facility] 04/20/2014, 3:10 PM

## 2014-04-20 NOTE — Progress Notes (Signed)
  Patient Discharge:  Disposition: home with daughter  Education: educated patient on discharge instructions, follow-up medications, and appointments.  Educated patient on COPD and fluid overload, signs and symptoms, and when to call a doctor.  Event organiser given to patient.  Verbalized understanding.    IV: removed  Telemetry: telemetry box 8 removed and returned, CCMD notified   Prescriptions: hard copy of prescriptions given to patient   Transportation:  Escorted to ride via wheelchair with RN  Belongings: gathered by patient, verified by [employer]

## 2014-05-01 ENCOUNTER — Inpatient Hospital Stay (HOSPITAL_COMMUNITY)
Admit: 2014-05-01 | Discharge: 2014-05-04 | DRG: 391 | Disposition: A | Discharge status: Home / Self Care | Payer: Medicare Other | Source: Other Acute Inpatient Hospital | Attending: Internal Medicine | Admitting: Internal Medicine

## 2014-05-01 ENCOUNTER — Telehealth: Payer: Self-pay | Admitting: Family Medicine

## 2014-05-01 ENCOUNTER — Encounter (HOSPITAL_COMMUNITY): Payer: Self-pay | Admitting: *Deleted

## 2014-05-01 DIAGNOSIS — Z79899 Other long term (current) drug therapy: Secondary | ICD-10-CM | POA: Diagnosis not present

## 2014-05-01 DIAGNOSIS — E119 Type 2 diabetes mellitus without complications: Secondary | ICD-10-CM | POA: Diagnosis present

## 2014-05-01 DIAGNOSIS — Z8614 Personal history of Methicillin resistant Staphylococcus aureus infection: Secondary | ICD-10-CM | POA: Diagnosis not present

## 2014-05-01 DIAGNOSIS — I739 Peripheral vascular disease, unspecified: Secondary | ICD-10-CM | POA: Diagnosis present

## 2014-05-01 DIAGNOSIS — I5032 Chronic diastolic (congestive) heart failure: Secondary | ICD-10-CM | POA: Diagnosis present

## 2014-05-01 DIAGNOSIS — J810 Acute pulmonary edema: Secondary | ICD-10-CM

## 2014-05-01 DIAGNOSIS — G9340 Encephalopathy, unspecified: Secondary | ICD-10-CM

## 2014-05-01 DIAGNOSIS — D62 Acute posthemorrhagic anemia: Secondary | ICD-10-CM

## 2014-05-01 DIAGNOSIS — Z2989 Encounter for other specified prophylactic measures: Secondary | ICD-10-CM

## 2014-05-01 DIAGNOSIS — Z85528 Personal history of other malignant neoplasm of kidney: Secondary | ICD-10-CM

## 2014-05-01 DIAGNOSIS — I252 Old myocardial infarction: Secondary | ICD-10-CM

## 2014-05-01 DIAGNOSIS — D631 Anemia in chronic kidney disease: Secondary | ICD-10-CM | POA: Diagnosis present

## 2014-05-01 DIAGNOSIS — Z9861 Coronary angioplasty status: Secondary | ICD-10-CM | POA: Diagnosis not present

## 2014-05-01 DIAGNOSIS — D649 Anemia, unspecified: Secondary | ICD-10-CM | POA: Diagnosis present

## 2014-05-01 DIAGNOSIS — I272 Pulmonary hypertension, unspecified: Secondary | ICD-10-CM | POA: Diagnosis present

## 2014-05-01 DIAGNOSIS — K219 Gastro-esophageal reflux disease without esophagitis: Secondary | ICD-10-CM | POA: Diagnosis present

## 2014-05-01 DIAGNOSIS — R001 Bradycardia, unspecified: Secondary | ICD-10-CM

## 2014-05-01 DIAGNOSIS — I4891 Unspecified atrial fibrillation: Secondary | ICD-10-CM | POA: Diagnosis present

## 2014-05-01 DIAGNOSIS — Z87891 Personal history of nicotine dependence: Secondary | ICD-10-CM

## 2014-05-01 DIAGNOSIS — M899 Disorder of bone, unspecified: Secondary | ICD-10-CM | POA: Diagnosis present

## 2014-05-01 DIAGNOSIS — A481 Legionnaires' disease: Secondary | ICD-10-CM

## 2014-05-01 DIAGNOSIS — K5732 Diverticulitis of large intestine without perforation or abscess without bleeding: Secondary | ICD-10-CM | POA: Diagnosis present

## 2014-05-01 DIAGNOSIS — Z9981 Dependence on supplemental oxygen: Secondary | ICD-10-CM | POA: Diagnosis not present

## 2014-05-01 DIAGNOSIS — E213 Hyperparathyroidism, unspecified: Secondary | ICD-10-CM

## 2014-05-01 DIAGNOSIS — N2581 Secondary hyperparathyroidism of renal origin: Secondary | ICD-10-CM | POA: Diagnosis present

## 2014-05-01 DIAGNOSIS — G4733 Obstructive sleep apnea (adult) (pediatric): Secondary | ICD-10-CM | POA: Diagnosis present

## 2014-05-01 DIAGNOSIS — J449 Chronic obstructive pulmonary disease, unspecified: Secondary | ICD-10-CM | POA: Diagnosis present

## 2014-05-01 DIAGNOSIS — Z418 Encounter for other procedures for purposes other than remedying health state: Secondary | ICD-10-CM

## 2014-05-01 DIAGNOSIS — I120 Hypertensive chronic kidney disease with stage 5 chronic kidney disease or end stage renal disease: Secondary | ICD-10-CM | POA: Diagnosis present

## 2014-05-01 DIAGNOSIS — R55 Syncope and collapse: Secondary | ICD-10-CM

## 2014-05-01 DIAGNOSIS — I509 Heart failure, unspecified: Secondary | ICD-10-CM | POA: Diagnosis present

## 2014-05-01 DIAGNOSIS — Z992 Dependence on renal dialysis: Secondary | ICD-10-CM | POA: Diagnosis not present

## 2014-05-01 DIAGNOSIS — G40909 Epilepsy, unspecified, not intractable, without status epilepticus: Secondary | ICD-10-CM | POA: Diagnosis present

## 2014-05-01 DIAGNOSIS — N039 Chronic nephritic syndrome with unspecified morphologic changes: Secondary | ICD-10-CM

## 2014-05-01 DIAGNOSIS — I2510 Atherosclerotic heart disease of native coronary artery without angina pectoris: Secondary | ICD-10-CM | POA: Diagnosis present

## 2014-05-01 DIAGNOSIS — J438 Other emphysema: Secondary | ICD-10-CM

## 2014-05-01 DIAGNOSIS — A0472 Enterocolitis due to Clostridium difficile, not specified as recurrent: Secondary | ICD-10-CM

## 2014-05-01 DIAGNOSIS — I6529 Occlusion and stenosis of unspecified carotid artery: Secondary | ICD-10-CM

## 2014-05-01 DIAGNOSIS — K5792 Diverticulitis of intestine, part unspecified, without perforation or abscess without bleeding: Secondary | ICD-10-CM | POA: Diagnosis present

## 2014-05-01 DIAGNOSIS — J4489 Other specified chronic obstructive pulmonary disease: Secondary | ICD-10-CM | POA: Diagnosis present

## 2014-05-01 DIAGNOSIS — Z48812 Encounter for surgical aftercare following surgery on the circulatory system: Secondary | ICD-10-CM

## 2014-05-01 DIAGNOSIS — T82898A Other specified complication of vascular prosthetic devices, implants and grafts, initial encounter: Secondary | ICD-10-CM

## 2014-05-01 DIAGNOSIS — J9610 Chronic respiratory failure, unspecified whether with hypoxia or hypercapnia: Secondary | ICD-10-CM | POA: Diagnosis present

## 2014-05-01 DIAGNOSIS — M949 Disorder of cartilage, unspecified: Secondary | ICD-10-CM | POA: Diagnosis present

## 2014-05-01 DIAGNOSIS — N189 Chronic kidney disease, unspecified: Secondary | ICD-10-CM

## 2014-05-01 DIAGNOSIS — D72829 Elevated white blood cell count, unspecified: Secondary | ICD-10-CM

## 2014-05-01 DIAGNOSIS — N186 End stage renal disease: Secondary | ICD-10-CM | POA: Diagnosis present

## 2014-05-01 DIAGNOSIS — R531 Weakness: Secondary | ICD-10-CM

## 2014-05-01 DIAGNOSIS — J9611 Chronic respiratory failure with hypoxia: Secondary | ICD-10-CM

## 2014-05-01 NOTE — Telephone Encounter (Signed)
Received call from [facility] ER about pt w/ diverticulitis on CT, ? ABLA. Baseline hx/o CHF, COPD, ESRD on DH TTS. Hgb 9.8-->7.8 (compared from check today vs. 6 weeks ago). Asymptomatic. Also with 3+ blood in urine w/o UTI findings, but no gross hematuria. Hemoccult negative. CT negative for any urinary pathology. Not on blood thinners. Currently asymptomatic. Started on levaquin and flagyl for diverticulitis coverage. WBC 15. Currrently hemodynamically stable and afebrile. Accepted to tele bed. Team 10.

## 2014-05-02 DIAGNOSIS — K5792 Diverticulitis of intestine, part unspecified, without perforation or abscess without bleeding: Secondary | ICD-10-CM | POA: Diagnosis present

## 2014-05-02 DIAGNOSIS — I509 Heart failure, unspecified: Secondary | ICD-10-CM

## 2014-05-02 DIAGNOSIS — E119 Type 2 diabetes mellitus without complications: Secondary | ICD-10-CM

## 2014-05-02 DIAGNOSIS — J449 Chronic obstructive pulmonary disease, unspecified: Secondary | ICD-10-CM

## 2014-05-02 LAB — CBC WITH DIFFERENTIAL/PLATELET
BASOS ABS: 0 10*3/uL (ref 0.0–0.1)
BASOS PCT: 0 % (ref 0–1)
Eosinophils Absolute: 0.1 10*3/uL (ref 0.0–0.7)
Eosinophils Relative: 1 % (ref 0–5)
HCT: 24.8 % — ABNORMAL LOW (ref 36.0–46.0)
Hemoglobin: 7.2 g/dL — ABNORMAL LOW (ref 12.0–15.0)
Lymphocytes Relative: 11 % — ABNORMAL LOW (ref 12–46)
Lymphs Abs: 1.3 10*3/uL (ref 0.7–4.0)
MCH: 29.9 pg (ref 26.0–34.0)
MCHC: 29 g/dL — AB (ref 30.0–36.0)
MCV: 102.9 fL — ABNORMAL HIGH (ref 78.0–100.0)
Monocytes Absolute: 1.1 10*3/uL — ABNORMAL HIGH (ref 0.1–1.0)
Monocytes Relative: 9 % (ref 3–12)
NEUTROS ABS: 9.5 10*3/uL — AB (ref 1.7–7.7)
NEUTROS PCT: 79 % — AB (ref 43–77)
Platelets: 191 10*3/uL (ref 150–400)
RBC: 2.41 MIL/uL — ABNORMAL LOW (ref 3.87–5.11)
RDW: 15.6 % — AB (ref 11.5–15.5)
WBC: 12.1 10*3/uL — ABNORMAL HIGH (ref 4.0–10.5)

## 2014-05-02 LAB — COMPREHENSIVE METABOLIC PANEL
ALT: 7 U/L (ref 0–35)
ANION GAP: 17 — AB (ref 5–15)
AST: 12 U/L (ref 0–37)
Albumin: 2.3 g/dL — ABNORMAL LOW (ref 3.5–5.2)
Alkaline Phosphatase: 82 U/L (ref 39–117)
BUN: 27 mg/dL — AB (ref 6–23)
CO2: 28 mEq/L (ref 19–32)
Calcium: 8.3 mg/dL — ABNORMAL LOW (ref 8.4–10.5)
Chloride: 93 mEq/L — ABNORMAL LOW (ref 96–112)
Creatinine, Ser: 4.68 mg/dL — ABNORMAL HIGH (ref 0.50–1.10)
GFR calc non Af Amer: 8 mL/min — ABNORMAL LOW (ref >90)
GFR, EST AFRICAN AMERICAN: 10 mL/min — AB (ref >90)
GLUCOSE: 89 mg/dL (ref 70–99)
POTASSIUM: 3.3 meq/L — AB (ref 3.7–5.3)
Sodium: 138 mEq/L (ref 137–147)
TOTAL PROTEIN: 5.4 g/dL — AB (ref 6.0–8.3)
Total Bilirubin: 0.5 mg/dL (ref 0.3–1.2)

## 2014-05-02 LAB — URINALYSIS, ROUTINE W REFLEX MICROSCOPIC
Bilirubin Urine: NEGATIVE
Glucose, UA: 100 mg/dL — AB
Ketones, ur: NEGATIVE mg/dL
Nitrite: NEGATIVE
Protein, ur: 100 mg/dL — AB
Specific Gravity, Urine: 1.009 (ref 1.005–1.030)
Urobilinogen, UA: 0.2 mg/dL (ref 0.0–1.0)
pH: 8 (ref 5.0–8.0)

## 2014-05-02 LAB — HEMOGLOBIN A1C
HEMOGLOBIN A1C: 5 % (ref <5.7)
MEAN PLASMA GLUCOSE: 97 mg/dL (ref <117)

## 2014-05-02 LAB — IRON AND TIBC
Iron: 21 ug/dL — ABNORMAL LOW (ref 42–135)
Saturation Ratios: 14 % — ABNORMAL LOW (ref 20–55)
TIBC: 145 ug/dL — AB (ref 250–470)
UIBC: 124 ug/dL — ABNORMAL LOW (ref 125–400)

## 2014-05-02 LAB — URINE MICROSCOPIC-ADD ON

## 2014-05-02 LAB — RETICULOCYTES
RBC.: 2.09 MIL/uL — AB (ref 3.87–5.11)
RETIC CT PCT: 2.1 % (ref 0.4–3.1)
Retic Count, Absolute: 43.9 10*3/uL (ref 19.0–186.0)

## 2014-05-02 LAB — GLUCOSE, CAPILLARY
GLUCOSE-CAPILLARY: 96 mg/dL (ref 70–99)
GLUCOSE-CAPILLARY: 98 mg/dL (ref 70–99)
GLUCOSE-CAPILLARY: 114 mg/dL — AB (ref 70–99)
GLUCOSE-CAPILLARY: 77 mg/dL (ref 70–99)
Glucose-Capillary: 176 mg/dL — ABNORMAL HIGH (ref 70–99)
Glucose-Capillary: 97 mg/dL (ref 70–99)

## 2014-05-02 LAB — MRSA PCR SCREENING: MRSA by PCR: POSITIVE — AB

## 2014-05-02 LAB — VITAMIN B12: VITAMIN B 12: 655 pg/mL (ref 211–911)

## 2014-05-02 LAB — FERRITIN: Ferritin: 1181 ng/mL — ABNORMAL HIGH (ref 10–291)

## 2014-05-02 LAB — FOLATE: Folate: >20 ng/mL

## 2014-05-02 MED ORDER — INSULIN ASPART 100 UNIT/ML ~~LOC~~ SOLN
0.0000 [IU] | SUBCUTANEOUS | Status: DC
  Administered 2014-05-02: 2 [IU] via SUBCUTANEOUS

## 2014-05-02 MED ORDER — ALBUTEROL SULFATE (2.5 MG/3ML) 0.083% IN NEBU: 2.5000 mg | INHALATION_SOLUTION | RESPIRATORY_TRACT | Status: DC | PRN

## 2014-05-02 MED ORDER — DARBEPOETIN ALFA-POLYSORBATE 100 MCG/0.5ML IJ SOLN
100.0000 ug | INTRAMUSCULAR | Status: DC
  Administered 2014-05-03: 100 ug via INTRAVENOUS
  Filled 2014-05-02: qty 0.5

## 2014-05-02 MED ORDER — INSULIN ASPART 100 UNIT/ML ~~LOC~~ SOLN: 0.0000 [IU] | Freq: Three times a day (TID) | SUBCUTANEOUS | Status: DC

## 2014-05-02 MED ORDER — PANTOPRAZOLE SODIUM 40 MG PO TBEC
80.0000 mg | DELAYED_RELEASE_TABLET | Freq: Every day | ORAL | Status: DC
  Administered 2014-05-02 – 2014-05-04 (×2): 80 mg via ORAL
  Filled 2014-05-02 (×2): qty 2

## 2014-05-02 MED ORDER — ROPINIROLE HCL 1 MG PO TABS
1.0000 mg | ORAL_TABLET | Freq: Two times a day (BID) | ORAL | Status: DC
  Administered 2014-05-02 – 2014-05-04 (×6): 1 mg via ORAL
  Filled 2014-05-02 (×8): qty 1

## 2014-05-02 MED ORDER — ONDANSETRON HCL 4 MG/2ML IJ SOLN: 4.0000 mg | Freq: Four times a day (QID) | INTRAMUSCULAR | Status: DC | PRN

## 2014-05-02 MED ORDER — MUPIROCIN 2 % EX OINT
1.0000 "application " | TOPICAL_OINTMENT | Freq: Two times a day (BID) | CUTANEOUS | Status: DC
  Administered 2014-05-03 – 2014-05-04 (×2): 1 via NASAL
  Filled 2014-05-02: qty 22

## 2014-05-02 MED ORDER — ALBUTEROL SULFATE (2.5 MG/3ML) 0.083% IN NEBU
2.5000 mg | INHALATION_SOLUTION | Freq: Four times a day (QID) | RESPIRATORY_TRACT | Status: DC
  Administered 2014-05-02 (×2): 2.5 mg via RESPIRATORY_TRACT
  Filled 2014-05-02 (×6): qty 3

## 2014-05-02 MED ORDER — CALCIUM ACETATE 667 MG PO CAPS
1334.0000 mg | ORAL_CAPSULE | Freq: Three times a day (TID) | ORAL | Status: DC
  Administered 2014-05-02 – 2014-05-04 (×6): 1334 mg via ORAL
  Filled 2014-05-02 (×10): qty 2

## 2014-05-02 MED ORDER — PIPERACILLIN-TAZOBACTAM IN DEX 2-0.25 GM/50ML IV SOLN
2.2500 g | Freq: Three times a day (TID) | INTRAVENOUS | Status: DC
  Administered 2014-05-02 – 2014-05-04 (×6): 2.25 g via INTRAVENOUS
  Filled 2014-05-02 (×8): qty 50

## 2014-05-02 MED ORDER — IPRATROPIUM BROMIDE 0.02 % IN SOLN: 2.0000 mL | Freq: Four times a day (QID) | RESPIRATORY_TRACT | Status: DC | PRN

## 2014-05-02 MED ORDER — METOCLOPRAMIDE HCL 5 MG PO TABS
5.0000 mg | ORAL_TABLET | Freq: Two times a day (BID) | ORAL | Status: DC
  Administered 2014-05-02 – 2014-05-04 (×5): 5 mg via ORAL
  Filled 2014-05-02 (×8): qty 1

## 2014-05-02 MED ORDER — CHLORHEXIDINE GLUCONATE CLOTH 2 % EX PADS
6.0000 | MEDICATED_PAD | Freq: Every day | CUTANEOUS | Status: DC
  Administered 2014-05-02 – 2014-05-04 (×3): 6 via TOPICAL

## 2014-05-02 MED ORDER — BOOST / RESOURCE BREEZE PO LIQD
1.0000 | Freq: Two times a day (BID) | ORAL | Status: DC
  Administered 2014-05-02 – 2014-05-04 (×3): 1 via ORAL

## 2014-05-02 MED ORDER — SODIUM CHLORIDE 0.9 % IV SOLN
INTRAVENOUS | Status: DC
  Administered 2014-05-02: 01:00:00 via INTRAVENOUS

## 2014-05-02 MED ORDER — RENA-VITE PO TABS
1.0000 | ORAL_TABLET | Freq: Every day | ORAL | Status: DC
  Administered 2014-05-02 – 2014-05-03 (×3): 1 via ORAL
  Filled 2014-05-02 (×5): qty 1

## 2014-05-02 MED ORDER — PIPERACILLIN-TAZOBACTAM IN DEX 2-0.25 GM/50ML IV SOLN
2.2500 g | Freq: Once | INTRAVENOUS | Status: AC
  Administered 2014-05-02: 2.25 g via INTRAVENOUS
  Filled 2014-05-02: qty 50

## 2014-05-02 MED ORDER — ACETAMINOPHEN 325 MG PO TABS: 650.0000 mg | ORAL_TABLET | Freq: Four times a day (QID) | ORAL | Status: DC | PRN

## 2014-05-02 NOTE — Progress Notes (Addendum)
73yo female w/ possible diverticulitis tx'd from [facility] to begin IV ABX.  Will start Zosyn 2.25g IV Q8H for ESRD and monitor CBC and Cx.  Wynona Neat, PharmD, BCPS 05/02/2014 12:50 AM    Adden (05/03/14 at 1300):  No further dose adjustment is needed for zosyn since current dose is appropriate for ESRD on HD.  Pharmacy will sign off.  Re-consult [place] if need further assistance. Dia Sitter, PharmD, BCPS

## 2014-05-02 NOTE — Care Management Note (Signed)
    Page 1 of 1   05/04/2014     4:48:58 PM CARE MANAGEMENT NOTE 05/04/2014  Patient:  [name], [name]   Account Number:  [id number]  Date Initiated:  05/02/2014  Documentation initiated by:  Tomi Bamberger  Subjective/Objective Assessment:   dx diverticulitis, anemia, esrd, htn, copd  admit- lives with daughter     Action/Plan:   Anticipated DC Date:  05/04/2014   Anticipated DC Plan:  [place]  CM consult      Choice offered to / List presented to:             Status of service:  Completed, signed off Medicare Important Message given?  YES (If response is "NO", the following Medicare IM given date fields will be blank) Date Medicare IM given:  05/04/2014 Medicare IM given by:  Tomi Bamberger Date Additional Medicare IM given:   Additional Medicare IM given by:    Discharge Disposition:  HOME/SELF CARE  Per UR Regulation:  Reviewed for med. necessity/level of care/duration of stay  If discussed at [facility] of Stay Meetings, dates discussed:    Comments:

## 2014-05-02 NOTE — Consult Note (Signed)
[facility] Renal Consultation Note    Indication for Consultation:  Management of ESRD/hemodialysis; anemia, hypertension/volume and secondary hyperparathyroidism PCP:  HPI: [name] is a 73 y.o. female with ESRD (MWF [facility]), HTN, PVD, afib, COPD on home O2, hx recurrent C diff reports 2 week history of abdominal pain (upper and lower), has urgent stools at times and can't make it to the bath room. Denies seeing blood in stools, but describes them as small and jelly like; appetite poor, gradually losing weight since starting HD. She was recently admitted in July ( also May) with SOB/ pul edema and EDW as lowered 1.5 kg most recently. Was feeling badly at dialysis yesterday but didn't agree to go to the hospital until the end. She then went to [facility] for evaluation where she was noted to have diverticulitis on CT as evidenced by segmental rectosigmoid wall thickening with surrounding stranding and tr pericolonic fluid per admitting note. WBC ^.  She was started on flagyl and Levaquin at [place]. There was also concern due to Hgb drop from 9.8 to 7.8 with 3 + blood in urine but heme neg by report. She's had some nausea and vomiting coffee today; really hadn't been eating much. Denies any more SOB that usual. 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 She denies fever, chills, cough, SOB. Appetite is poor.   Past Medical History  Diagnosis Date  . Carotid artery occlusion     bilat CEA in 2012  . C. 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 Hypertension   . Atrial fibrillation April  2013  . Pulmonary hypertension   . Secondary hyperparathyroidism, renal   . COPD (chronic obstructive pulmonary disease)   . Hypercholesterolemia   . History of MI (myocardial infarction)     "they say I had 2 years ago" (08/20/2012)  . GERD (gastroesophageal reflux disease)   . History of pneumonia      "have it alot" (08/20/2012)  . Sleep apnea     "don't wear mask anymore"  . Type II diabetes mellitus   . History of blood transfusion 2013    "3 times so far in 2013:  4 pints one time; 2 pints another; ?# last time" (08/20/2012)  . Iron deficiency anemia   . Seizures 2012    after carotid surgery  . ESRD on dialysis     "[place]; Tues, Thurs; Sat"  . On home oxygen therapy     "24/7"   . Acute diverticulitis     Oct 2013, treated medically [facility], sigmoid diverticulitits  . CHF (congestive heart failure)   . Coronary artery disease   . Seizure disorder March 2011  . Myocardial infarction   . Renal cell carcinoma 2005?  Marland Kitchen Cancer of kidney 2005   Past Surgical History  Procedure Laterality Date  . Nephrectomy  2005    Right, for Renal cell carcinoma  . Carotid endarterectomy  2012    bilaterally; Dr. Kellie Simmering  . Av fistula placement  Jan. 7, 2011    Right  upper arm by Dr. Kellie Simmering  . Hd catheter  Aug. 22, 2013    [facility]  . Appendectomy      childhood  . Abdominal hysterectomy  1971?  Marland Kitchen Coronary angioplasty with stent placement  1990's    "1 total"  . Coronary angioplasty  1990's  . Av fistula  repair  2013    right upper arm  . Esophagogastroduodenoscopy N/A 12/29/2012    Procedure: ESOPHAGOGASTRODUODENOSCOPY (EGD);  Surgeon: Jeryl Columbia, MD;  Location: [facility] ENDOSCOPY;  Service: Endoscopy;  Laterality: N/A;  . Colonoscopy with propofol N/A 12/31/2012    Procedure: COLONOSCOPY WITH PROPOFOL;  Surgeon: Jeryl Columbia, MD;  Location: [facility] ENDOSCOPY;  Service: Endoscopy;  Laterality: N/A;  currently IP at [place] History  Problem Relation Age of Onset  . Cancer Mother     pancreatic  . Diabetes Mother   . Stroke Father   . Coronary artery disease Father   . Heart disease Father 19    Heart Disease before age 65  . Hyperlipidemia Father   . Hypertension Father   . Heart attack Father   . Cancer Brother     Brain  . Kidney disease Brother      Kidney stones  . Emphysema Father     smoked  . Pancreatic cancer Mother    Social History:  reports that she quit smoking about 10 years ago. Her smoking use included Cigarettes. She has a 67.5 pack-year smoking history. She has never used smokeless tobacco. She reports that she does not drink alcohol or use illicit drugs. Allergies  Allergen Reactions  . Morphine And Related Nausea And Vomiting  . Lipitor [Atorvastatin] Other (See Comments)    Causes weakness and drowsiness  . Nsaids     Unknown reported by previous hospital   Prior to Admission medications   Medication Sig Start Date End Date Taking? Authorizing Provider  amiodarone (PACERONE) 200 MG tablet Take 200 mg by mouth daily. 04/29/14  Yes Historical Provider, MD  calcium acetate (PHOSLO) 667 MG capsule Take 1,334 mg by mouth 3 (three) times daily with meals.    Yes Historical Provider, MD  glipiZIDE (GLUCOTROL XL) 2.5 MG 24 hr tablet Take 2.5 mg by mouth daily as needed. Take when sugar is 150 or greater   Yes Historical Provider, MD  ipratropium (ATROVENT HFA) 17 MCG/ACT inhaler Inhale 2 puffs into the lungs every 6 (six) hours as needed for wheezing.   Yes Historical Provider, MD  levalbuterol (XOPENEX) 1.25 MG/3ML nebulizer solution Take 1.25 mg by nebulization every 4 (four) hours as needed for wheezing.   Yes Historical Provider, MD  metoCLOPramide (REGLAN) 5 MG tablet Take 5 mg by mouth 2 (two) times daily.   Yes Historical Provider, MD  rOPINIRole (REQUIP) 1 MG tablet Take 1 mg by mouth 2 (two) times daily.   Yes Historical Provider, MD   Current Facility-Administered Medications  Medication Dose Route Frequency Provider Last Rate Last Dose  . acetaminophen (TYLENOL) tablet 650 mg  650 mg Oral Q6H PRN Shanker Kristeen Mans, MD      . albuterol (PROVENTIL) (2.5 MG/3ML) 0.083% nebulizer solution 2.5 mg  2.5 mg Nebulization Q2H PRN Shanker Kristeen Mans, MD      . calcium acetate (PHOSLO) capsule 1,334 mg  1,334 mg Oral TID WC  Shanda Howells, MD   1,334 mg at 05/02/14 2542  . Chlorhexidine Gluconate Cloth 2 % PADS 6 each  6 each Topical Q0600 Shanda Howells, MD   6 each at 05/02/14 0515  . insulin aspart (novoLOG) injection 0-9 Units  0-9 Units Subcutaneous 6 times per day Shanda Howells, MD      . ipratropium (ATROVENT) nebulizer solution 0.4 mg  2 mL Inhalation Q6H PRN Shanda Howells, MD      . metoCLOPramide [facility])  tablet 5 mg  5 mg Oral BID Shanda Howells, MD   5 mg at 05/02/14 4128  . multivitamin (RENA-VIT) tablet 1 tablet  1 tablet Oral QHS Shanda Howells, MD   1 tablet at 05/02/14 0152  . [START ON 05/03/2014] mupirocin ointment (BACTROBAN) 2 % 1 application  1 application Nasal BID Shanda Howells, MD      . ondansetron [facility]) injection 4 mg  4 mg Intravenous Q6H PRN Jonetta Osgood, MD      . pantoprazole (PROTONIX) EC tablet 80 mg  80 mg Oral Daily Shanda Howells, MD   80 mg at 05/02/14 7867  . piperacillin-tazobactam (ZOSYN) IVPB 2.25 g  2.25 g Intravenous Q8H Rogue Bussing, RPH   2.25 g at 05/02/14 0920  . rOPINIRole (REQUIP) tablet 1 mg  1 mg Oral BID Shanda Howells, MD   1 mg at 05/02/14 6720   Labs: Basic Metabolic Panel:  Recent Labs Lab 05/02/14 0550  NA 138  K 3.3*  CL 93*  CO2 28  GLUCOSE 89  BUN 27*  CREATININE 4.68*  CALCIUM 8.3*   Liver Function Tests:  Recent Labs Lab 05/02/14 0550  AST 12  ALT 7  ALKPHOS 82  BILITOT 0.5  PROT 5.4*  ALBUMIN 2.3*  CBC:  Recent Labs Lab 05/02/14 0550  WBC 12.1*  NEUTROABS 9.5*  HGB 7.2*  HCT 24.8*  MCV 102.9*  PLT 191  CBG:  Recent Labs Lab 05/02/14 0056 05/02/14 0401 05/02/14 0759  GLUCAP 97 96 98   Iron Studies:  Recent Labs  05/02/14 0220  IRON 21*  TIBC 145*  FERRITIN 1181*   ROS: As per HPI otherwise negative.  Physical Exam: Filed Vitals:   05/01/14 2250 05/02/14 0124 05/02/14 0402  BP: 147/73  134/63  Pulse: 83 74 74  Temp: 98.2 F (36.8 C)  98.4 F (36.9 C)  TempSrc: Oral  Oral  Resp: 18 20 18    Height: 5\' 4"  (1.626 m)    Weight: 73.619 kg (162 lb 4.8 oz)  74.481 kg (164 lb 3.2 oz)  SpO2: 100% 96% 100%     General: Well developed, overweight elderly lady looks very much her age, in no acute distress wearing O2 Head: Normocephalic, atraumatic, sclera non-icteric, mucus membranes are moist Neck: Supple. JVD not elevated. Lungs:  Occasional soft wheeze. Breathing is unlabored. Heart: RRR with 2/6 murmur Abdomen: Soft, non-tender, non-distended + bowel sounds. No rebound/guarding. . Lower extremities: without edema or ischemic changes, no open wounds  Neuro: Alert and oriented X 3. Moves all extremities spontaneously. Psych:  Responds to questions appropriately with a normal affect. Dialysis Access:right upper AVF + bruit/thrill  Dialysis Orders: Center: [facility] MWF 4 hr EDW 75 180 400/A 1.5 2 K 2.25 Ca profile 2 no var Na AVF ARanesp 80 weekly last given 7/29 ; no Fe or Hectorol no heparin Recent labs:  Hgb 9.4 7/29 - ESA just increased from 60 q 2 weeks to 80 per week; iPTH 384 stable; gets +/- edw was 74.4 post HD 8/3 with UF 1.8 on 8/3   Assessment/Plan: 1.  Acute diverticulitis - on Zosyn 2.  ESRD -  MWF - next HD Wed - lower edw gently 3.  Hypertension/volume  - ok ; edw lowered 5 Kg since May 4.  Anemia  - Hgb 7.2  Trending down was 9.4 7/29 -  tsat 47% 7/24; Fe 21 feritiin 1100 8/3; recheck with pre HD labs - will  transfuse 2 units on HD  Wed if still down, this would also given iron load; will dose aranesp 100 8/5 5.  Metabolic bone disease -  iPTH  384 - not on hectorol 6.  Nutrition - CL - alb low - add resource - CL supplement 7.  Contact isolation - hx MRSA/prior c diff 8.  COPD - nebs plus chronic O2 9.  DM - per primary 10.  Afib - on amio - in NSR; previous dose was 400 at last discharge; per med rec says 200 starting 8/1; she does not know the   name of her medicine; have paged primary - currently not receiving 11.  PVD - s/o bilateral CEA  Myriam Jacobson,  PA-C [facility] Beeper [phone] 05/02/2014, 11:38 AM   I have seen and examined this patient and agree with plan as documented by [facility]. Pt with ESRD, MWF [facility] admitted with abdominal pain and CT evidence for rectosigmoid diverticulitis on outside CT. With clear liqs/ATB's her abd pain is improved. And abd currently soft. BP looks fine. Will stop NS to avoid volume overload. (I see that saline lock ordered but NS is running at 100/hour).  Have spoke to RN.  Plan for dialysis tomorrow on usual schedule.  Pt expressed concern that ATB's for TMT of her diverticulitus might result in CDiff (she had prolonged bout in the past) . Told her to report any diarrhea if she experiences, but explained no other choice but to treat her.  Abbygael Curtiss B,MD 05/02/2014 4:55 PM

## 2014-05-02 NOTE — Progress Notes (Signed)
PATIENT DETAILS Name: [name] Age: 73 y.o. Sex: female Date of Birth: Apr 14, 1941 Admit Date: 05/01/2014 Admitting Physician Shanda Howells, MD DJS:HFWYOV,ZCHY, MD  Subjective: Doing better.  Still has some diffuse abdominal soreness in lower abdomen.  No complaints of N/V/D, CP, SOB.  Assessment/Plan: Active Problems:   Diverticulitis  1- Diverticulitis  -CT scan done in [facility] showing diverticulitis,also noted on most recent colonoscopy 12/2012 w/ internal external hemorrhoids, diverticulosis. -Admitted and placed on zosyn IV, still with lower abdominal pain, but does not look toxic.Leukocytosis has decreased. -blood cultures are pending  2-Anemia  -Acute on chronic issue in setting of ESRD . No overt evidence of acute blood loss. -noted remote hx/o RCC in the past. No active lesions or pathology on imaging.  -Hgb today of 7.2.  Will transfuse during dialysis tomorrow. Renal to dose Aranesp, IV Iron -Continue to follow.   3-ESRD  -HD MWF in [facility]  - Nephrology consult obtained  4-Chronic Diastolic CHF  -diuresiss with HD -pending weight  -clinically euvolemic  -strict Is and Os.   5-HTN  -stable  -continue home regimen.   6-COPD  -stable currently  -no resp complaints  -cont home prn nebs   7-DM  -SSI, A1c   FEN/GI: Advance diet to clear liquids   Prophylaxis: SCDs pending blood work   Disposition: pending further evaluation   Code Status:Full Code   Family Communication None at bedside  Procedures:  None   CONSULTS:  nephrology  Time spent 40 minutes-which includes 50% of the time with face-to-face with patient/ family and coordinating care related to the above assessment and plan.    MEDICATIONS: Scheduled Meds: . albuterol  2.5 mg Nebulization Q6H  . calcium acetate  1,334 mg Oral TID WC  . Chlorhexidine Gluconate Cloth  6 each Topical Q0600  . insulin aspart  0-9 Units Subcutaneous 6 times per day  . metoCLOPramide   5 mg Oral BID  . multivitamin  1 tablet Oral QHS  . [START ON 05/03/2014] mupirocin ointment  1 application Nasal BID  . pantoprazole  80 mg Oral Daily  . piperacillin-tazobactam (ZOSYN)  IV  2.25 g Intravenous Q8H  . rOPINIRole  1 mg Oral BID   Continuous Infusions:  PRN Meds:.ipratropium  Antibiotics: Anti-infectives   Start     Dose/Rate Route Frequency Ordered Stop   05/02/14 0800  piperacillin-tazobactam (ZOSYN) IVPB 2.25 g     2.25 g 100 mL/hr over 30 Minutes Intravenous Every 8 hours 05/02/14 0048     05/02/14 0100  piperacillin-tazobactam (ZOSYN) IVPB 2.25 g     2.25 g 100 mL/hr over 30 Minutes Intravenous  Once 05/02/14 0048 05/02/14 0223       PHYSICAL EXAM: Vital signs in last 24 hours: Filed Vitals:   05/01/14 2250 05/02/14 0124 05/02/14 0402  BP: 147/73  134/63  Pulse: 83 74 74  Temp: 98.2 F (36.8 C)  98.4 F (36.9 C)  TempSrc: Oral  Oral  Resp: 18 20 18   Height: 5\' 4"  (1.626 m)    Weight: 73.619 kg (162 lb 4.8 oz)  74.481 kg (164 lb 3.2 oz)  SpO2: 100% 96% 100%    Weight change:  Filed Weights   05/01/14 2250 05/02/14 0402  Weight: 73.619 kg (162 lb 4.8 oz) 74.481 kg (164 lb 3.2 oz)   Body mass index is 28.17 kg/(m^2).   Gen Exam: Awake and alert with clear speech.   Neck: Supple, No  JVD. Chest: B/L Clear.  No wheezes, crackles, or rhonchi noted CVS: S1 S2 Regular, no murmurs, gallops, or rubs noted Abdomen: soft, BS +, non distended, tender to palpation in lower abdomen Extremities: no edema, lower extremities warm to touch. Neurologic: Non Focal.   Skin: No Rash.  Wounds: N/A.   Intake/Output from previous day: No intake or output data in the 24 hours ending 05/02/14 0851   LAB RESULTS: CBC  Recent Labs Lab 05/02/14 0550  WBC 12.1*  HGB 7.2*  HCT 24.8*  PLT 191  MCV 102.9*  MCH 29.9  MCHC 29.0*  RDW 15.6*  LYMPHSABS 1.3  MONOABS 1.1*  EOSABS 0.1  BASOSABS 0.0    Chemistries   Recent Labs Lab 05/02/14 0550  NA 138  K  3.3*  CL 93*  CO2 28  GLUCOSE 89  BUN 27*  CREATININE 4.68*  CALCIUM 8.3*    CBG:  Recent Labs Lab 05/02/14 0056 05/02/14 0401 05/02/14 0759  GLUCAP 97 96 98    GFR Estimated Creatinine Clearance: 10.7 ml/min (by C-G formula based on Cr of 4.68).  Coagulation profile No results found for this basename: INR, PROTIME,  in the last 168 hours  Cardiac Enzymes No results found for this basename: CK, CKMB, TROPONINI, MYOGLOBIN,  in the last 168 hours  No components found with this basename: POCBNP,  No results found for this basename: DDIMER,  in the last 72 hours No results found for this basename: HGBA1C,  in the last 72 hours No results found for this basename: CHOL, HDL, LDLCALC, TRIG, CHOLHDL, LDLDIRECT,  in the last 72 hours No results found for this basename: TSH, T4TOTAL, FREET3, T3FREE, THYROIDAB,  in the last 72 hours  Recent Labs  05/02/14 0220  RETICCTPCT 2.1   No results found for this basename: LIPASE, AMYLASE,  in the last 72 hours  Urine Studies No results found for this basename: UACOL, UAPR, USPG, UPH, UTP, UGL, UKET, UBIL, UHGB, UNIT, UROB, ULEU, UEPI, UWBC, URBC, UBAC, CAST, CRYS, UCOM, BILUA,  in the last 72 hours  MICROBIOLOGY: Recent Results (from the past 240 hour(s))  MRSA PCR SCREENING     Status: Abnormal   Collection Time    05/01/14 11:03 PM      Result Value Ref Range Status   MRSA by PCR POSITIVE (*) NEGATIVE Final   Comment:            The GeneXpert MRSA Assay (FDA     approved for NASAL specimens     only), is one component of a     comprehensive MRSA colonization     surveillance program. It is not     intended to diagnose MRSA     infection nor to guide or     monitor treatment for     MRSA infections.     RESULT CALLED TO, READ BACK BY AND VERIFIED WITH:     A.MUHAMMED RN [id number] 05/02/14 E.GADDY    RADIOLOGY STUDIES/RESULTS: Dg Chest Port 1 View  04/19/2014   CLINICAL DATA:  Shortness of breath.  EXAM: PORTABLE CHEST - 1  VIEW  COMPARISON:  Chest radiograph April 17, 2014  FINDINGS: The cardiac silhouette appears mildly enlarged, similar. Calcified aortic knob. Slightly increasing diffuse interstitial prominence, patchy left lower lobe airspace opacity in at least small left, minimal right pleural effusion. No pneumothorax.  Multiple EKG lines overlie the patient and may obscure subtle underlying pathology. Soft tissue planes and included osseous structures are nonsuspicious.  IMPRESSION: Stable cardiomegaly, slightly increasing interstitial prominence suggests pulmonary edema with patchy left lower lobe airspace opacity which may reflect atelectasis and/or confluent edema with left greater than right pleural effusions (minimal to small).   Electronically Signed   By: Elon Alas   On: 04/19/2014 05:20    Janifer Adie, PA-S, [place]  [facility] Pager:[phone]  If 7PM-7AM, please contact night-coverage [email or web address] Password [email or web address] 05/02/2014, 8:51 AM   LOS: 1 day   **Disclaimer: This note may have been dictated with voice recognition software. Similar sounding words can inadvertently be transcribed and this note may contain transcription errors which may not have been corrected upon publication of note.**  Attending Patient was seen, examined,treatment plan was discussed with the Physician extender. I have directly reviewed the clinical findings, lab, imaging studies and management of this patient in detail. I have made the necessary changes to the above noted documentation, and agree with the documentation, as recorded by the Physician extender.  Nena Alexander MD [facility].

## 2014-05-02 NOTE — Progress Notes (Signed)
INITIAL NUTRITION ASSESSMENT  DOCUMENTATION CODES Per approved criteria  -Not Applicable   INTERVENTION: - Continue Resource Breeze po BID, each supplement provides 250 kcal and 9 grams of protein - Once diet upgraded, recommend changing oral nutrition supplement to Nepro Shake po TID, each supplement provides 425 kcal and 19 grams protein - Pt was educated on a renal diet using "Use a Meal" booklet. Teach-back method was used and questions were answered. - Diet advancement as medically tolerated per MD discretion.  NUTRITION DIAGNOSIS: Inadequate oral intake related to renal failure on hemodialysis as evidenced by wt loss and reported intake less than estimated needs.   Goal: Pt to meet >/= 90% of their estimated nutrition needs   Monitor:  Weight trends, po intake, acceptance of supplements, I/O's, diet advancement.  Reason for Assessment: MST  73 y.o. female  Admitting Dx: <principal problem not specified>  ASSESSMENT: 73 y.o. female with ESRD (MWF [place]), HTN, PVD, afib, COPD on home O2, hx recurrent C diff reports 2 week history of abdominal pain (upper and lower), has urgent stools at times and can't make it to the bath room. Denies seeing blood in stools, but describes them as small and jelly like; appetite poor, gradually losing weight since starting HD. She was recently admitted in July ( also May) with SOB/ pul edema and EDW as lowered 1.5 kg most recently. Was feeling badly at dialysis yesterday but didn't agree to go to the hospital until the end. She then went to [facility] for evaluation where she was noted to have diverticulitis.  - Pt reports that her weight was 194 lbs one year ago. - She says that she has not been eating well due to "being in and out of the hospital." She reports skipping meals for entire days at times.  - Pt and daughter had questions about a renal diet and what foods were appropriate. RD provided education with handouts. Questions were answered. Pt  was recommended to continue Nepro Shakes when at home to discourage further weight loss. - Pt with no signs of significant fat and/or muscle wasting.  Height: Ht Readings from Last 1 Encounters:  05/01/14 5\' 4"  (1.626 m)    Weight: Wt Readings from Last 1 Encounters:  05/02/14 164 lb 3.2 oz (74.481 kg)    Ideal Body Weight: 54.7 kg  % Ideal Body Weight: 136%  Wt Readings from Last 10 Encounters:  05/02/14 164 lb 3.2 oz (74.481 kg)  04/19/14 165 lb 9.1 oz (75.101 kg)  02/28/14 170 lb 13.7 oz (77.5 kg)  11/10/13 177 lb (80.287 kg)  10/11/13 178 lb (80.74 kg)  09/27/13 171 lb (77.565 kg)  08/31/13 172 lb 13.5 oz (78.4 kg)  06/27/13 166 lb 7.2 oz (75.5 kg)  12/30/12 171 lb 11.2 oz (77.883 kg)  12/30/12 171 lb 11.2 oz (77.883 kg)   BMI:  Body mass index is 28.17 kg/(m^2).  Estimated Nutritional Needs: Kcal: 1850-2150 Protein: 80-90 g Fluid: 1200 ml fluid restriction  Skin: intact  Diet Order: Clear Liquid  EDUCATION NEEDS: -Education needs addressed   Intake/Output Summary (Last 24 hours) at 05/02/14 1430 Last data filed at 05/02/14 1421  Gross per 24 hour  Intake    717 ml  Output     50 ml  Net    667 ml    Last BM: 7/23   Labs:   Recent Labs Lab 05/02/14 0550  NA 138  K 3.3*  CL 93*  CO2 28  BUN 27*  CREATININE  4.68*  CALCIUM 8.3*  GLUCOSE 89    CBG (last 3)   Recent Labs  05/02/14 0401 05/02/14 0759 05/02/14 1152  GLUCAP 96 98 176*    Scheduled Meds: . calcium acetate  1,334 mg Oral TID WC  . Chlorhexidine Gluconate Cloth  6 each Topical Q0600  . [START ON 05/03/2014] darbepoetin (ARANESP) injection - DIALYSIS  100 mcg Intravenous Q Wed-HD  . feeding supplement (RESOURCE BREEZE)  1 Container Oral BID BM  . insulin aspart  0-9 Units Subcutaneous 6 times per day  . metoCLOPramide  5 mg Oral BID  . multivitamin  1 tablet Oral QHS  . [START ON 05/03/2014] mupirocin ointment  1 application Nasal BID  . pantoprazole  80 mg Oral Daily  .  piperacillin-tazobactam (ZOSYN)  IV  2.25 g Intravenous Q8H  . rOPINIRole  1 mg Oral BID    Continuous Infusions:   Past Medical History  Diagnosis Date  . Carotid artery occlusion     bilat CEA in 2012  . C. 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 Hypertension   . Atrial fibrillation April  2013  . Pulmonary hypertension   . Secondary hyperparathyroidism, renal   . COPD (chronic obstructive pulmonary disease)   . Hypercholesterolemia   . History of MI (myocardial infarction)     "they say I had 2 years ago" (08/20/2012)  . GERD (gastroesophageal reflux disease)   . History of pneumonia     "have it alot" (08/20/2012)  . Sleep apnea     "don't wear mask anymore"  . Type II diabetes mellitus   . History of blood transfusion 2013    "3 times so far in 2013:  4 pints one time; 2 pints another; ?# last time" (08/20/2012)  . Iron deficiency anemia   . Seizures 2012    after carotid surgery  . ESRD on dialysis     "[place]; Tues, Thurs; Sat"  . On home oxygen therapy     "24/7"   . Acute diverticulitis     Oct 2013, treated medically [facility], sigmoid diverticulitits  . CHF (congestive heart failure)   . Coronary artery disease   . Seizure disorder March 2011  . Myocardial infarction   . Renal cell carcinoma 2005?  Marland Kitchen Cancer of kidney 2005    Past Surgical History  Procedure Laterality Date  . Nephrectomy  2005    Right, for Renal cell carcinoma  . Carotid endarterectomy  2012    bilaterally; Dr. Kellie Simmering  . Av fistula placement  Jan. 7, 2011    Right  upper arm by Dr. Kellie Simmering  . Hd catheter  Aug. 22, 2013    [facility]  . Appendectomy      childhood  . Abdominal hysterectomy  1971?  Marland Kitchen Coronary angioplasty with stent placement  1990's    "1 total"  . Coronary angioplasty  1990's  . Av fistula repair  2013    right upper arm  . Esophagogastroduodenoscopy N/A 12/29/2012    Procedure: ESOPHAGOGASTRODUODENOSCOPY (EGD);  Surgeon: Jeryl Columbia, MD;  Location: [facility] ENDOSCOPY;  Service: Endoscopy;  Laterality: N/A;  . Colonoscopy with propofol N/A 12/31/2012    Procedure: COLONOSCOPY WITH PROPOFOL;  Surgeon: Jeryl Columbia, MD;  Location: [facility] ENDOSCOPY;  Service: Endoscopy;  Laterality: N/A;  currently IP at [place], LDN

## 2014-05-02 NOTE — H&P (Signed)
Hospitalist Admission History and Physical  Patient name: [name] Medical record number: [id number] Date of birth: 1940-10-14 Age: 73 y.o. Gender: female  Primary Care Provider: Gilford Rile, Cook  Chief Complaint: diverticulitis, anemia   History of Present Illness:This is a 73 y.o. year old female with significant past medical history of multiple medical problems including ESRD w/ HD MWF, HTN, COPD, CHF, diverticulosis  presenting with diverticulitis. Pt noted to have been admitted 72/22-7/23 for acute resp failure 2/2 COPD and volume overload. Please see discharge summary for full details. No resp complaints today. Pt states that she has had progressive worsening lower abd pain over the course of the weekend. Pt went to dialysis today, where she reported pain. Pt was then redirected to [facility] for further evaluation. Pt was noted to have diverticulitis on CT scan. Pt also with ?worsening anemia reported hgb change 9.8-->7.8 compared to bloodwork over 6 week span. Pt asymptomatic with this.  Hemoccult negative per report. UA w/ /3+ blood per report. Pt denies any dysuria, hematuria. Pt denies eating any nuts or seeds.  [place] vitals and labs: T-99.1, BP 132/60, HR 83, Satting 95% on RA, WBC 15.7, Hgb 7.8, K 3.4, bicarb 33, Cr 3.5, BUN 23. UA 3+ blood. EKG NSR. CT Abd/Pel showed segemntal rectosigmoid wall thickening, surrounding stranding and trace pericolonic fluid suggestive of acute diverticulitis.  Pt started on levaquin and flagyl in ER.  On presentation, pain much improved. Sleeping comfortably.    Assessment and Plan: [name] is a 73 y.o. year old female presenting with diverticulitis, anemia   Active Problems:   Diverticulitis   1- Diverticulitis -Place on zosyn IV -blood cultures  -Continue to follow -May broaden if sxs fail to improve. -Noted most recent colonoscopy 12/2012 w/ internal external hemorrhoids, diverticulosis,    2-Anemia  -Acute on chronic issue in  setting of ESRD  -? Blood in urine.  - Will repeat  -noted remote hx/o RCC in the past. No active lesions or pathology on imaging.  -Continue to follow.  -Anemia panel  -repeat hemoccult   3-ESRD -HD MWF in [place] -Renal consult in am  4-CHF  -s/p HD today.  -pending weight  -clinically euvolemic  -strict Is and Os.   5-HTN -stable  -continue home regimen.   6-COPD  -stable currently  -no resp complaints -cont home prn nebs   7-DM -SSI, A1c  FEN/GI: NPO for now.  Prophylaxis: SCDs pending blood work  Disposition: pending further evaluation  Code Status:Full Code    Patient Active Problem List   Diagnosis Date Noted  . Diverticulitis 05/02/2014  . Acute respiratory failure 04/19/2014  . ESRD (end stage renal disease) on dialysis 04/19/2014  . COPD (chronic obstructive pulmonary disease) 02/28/2014  . Pneumonia 02/28/2014  . Pain in limb-Left neck 09/27/2013  . Aftercare following surgery of the circulatory system, [place] 09/27/2013  . Junctional bradycardia 08/28/2013  . Pre-syncope 08/28/2013  . Anemia in chronic kidney disease 08/28/2013  . Chest pain 08/28/2013  . Bradycardia 08/27/2013  . Atrial fibrillation 06/23/2013  . Chronic respiratory failure with hypoxia 06/20/2013  . HTN (hypertension) 06/20/2013  . Pulmonary HTN 06/20/2013  . OSA (obstructive sleep apnea) 06/20/2013  . Weakness generalized 12/28/2012  . GIB (gastrointestinal bleeding) 12/28/2012  . Diabetes 12/28/2012  . Hyperparathyroidism 12/28/2012  . Legionella pneumonia 10/23/2012  . Acute encephalopathy 09/22/2012  . Acute pulmonary edema 09/22/2012  . C. difficile colitis 09/22/2012  . Occlusion and stenosis of carotid artery without mention  of cerebral infarction 08/24/2012  . Leukocytosis 08/19/2012  . Clostridium difficile diarrhea 07/31/2012  . Diverticulitis of sigmoid colon 07/28/2012  . DM2 (diabetes mellitus, type 2) 07/28/2012  . Acute blood loss anemia 07/28/2012  .  Pulmonary edema 07/28/2012  . CKD (chronic kidney disease) stage V requiring chronic dialysis 06/21/2012  . Other complications due to renal dialysis device, implant, and graft 06/21/2012   Past Medical History: Past Medical History  Diagnosis Date  . Carotid artery occlusion     bilat CEA in 2012  . C. 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 Hypertension   . Atrial fibrillation April  2013  . Pulmonary hypertension   . Secondary hyperparathyroidism, renal   . COPD (chronic obstructive pulmonary disease)   . Hypercholesterolemia   . History of MI (myocardial infarction)     "they say I had 2 years ago" (08/20/2012)  . GERD (gastroesophageal reflux disease)   . History of pneumonia     "have it alot" (08/20/2012)  . Sleep apnea     "don't wear mask anymore"  . Type II diabetes mellitus   . History of blood transfusion 2013    "3 times so far in 2013:  4 pints one time; 2 pints another; ?# last time" (08/20/2012)  . Iron deficiency anemia   . Seizures 2012    after carotid surgery  . ESRD on dialysis     "; Tues, Thurs; Sat"  . On home oxygen therapy     "24/7"   . Acute diverticulitis     Oct 2013, treated medically [facility], sigmoid diverticulitits  . CHF (congestive heart failure)   . Coronary artery disease   . Seizure disorder March 2011  . Myocardial infarction   . Renal cell carcinoma 2005?  Marland Kitchen Cancer of kidney 2005    Past Surgical History: Past Surgical History  Procedure Laterality Date  . Nephrectomy  2005    Right, for Renal cell carcinoma  . Carotid endarterectomy  2012    bilaterally; Dr. Kellie Simmering  . Av fistula placement  Jan. 7, 2011    Right  upper arm by Dr. Kellie Simmering  . Hd catheter  Aug. 22, 2013    [facility]  . Appendectomy      childhood  . Abdominal hysterectomy  1971?  Marland Kitchen Coronary angioplasty with stent placement  1990's    "1 total"  . Coronary angioplasty  1990's  . Av fistula repair  2013     right upper arm  . Esophagogastroduodenoscopy N/A 12/29/2012    Procedure: ESOPHAGOGASTRODUODENOSCOPY (EGD);  Surgeon: Jeryl Columbia, Cook;  [facility] ENDOSCOPY;  Service: Endoscopy;  Laterality: N/A;  . Colonoscopy with propofol N/A 12/31/2012    Procedure: COLONOSCOPY WITH PROPOFOL;  Surgeon: Jeryl Columbia, Cook;  Location: [facility] ENDOSCOPY;  Service: Endoscopy;  Laterality: N/A;  currently IP at [place]    Social History: History   Social History  . Marital Status: Widowed    Spouse Name: N/A    Number of Children: N/A  . Years of Education: N/A   Occupational History  . Retired    Social History Main Topics  . Smoking status: Former Smoker -- 1.50 packs/day for 45 years    Types: Cigarettes    Quit date: 09/30/2003  . Smokeless tobacco: Never Used  . Alcohol Use: No  . Drug Use: No  . Sexual Activity: No   Other Topics Concern  . None  Social History Narrative  . None    Family History: Family History  Problem Relation Age of Onset  . Cancer Mother     pancreatic  . Diabetes Mother   . Stroke Father   . Coronary artery disease Father   . Heart disease Father 27    Heart Disease before age 67  . Hyperlipidemia Father   . Hypertension Father   . Heart attack Father   . Cancer Brother     Brain  . Kidney disease Brother     Kidney stones  . Emphysema Father     smoked  . Pancreatic cancer Mother     Allergies: Allergies  Allergen Reactions  . Morphine And Related Nausea And Vomiting  . Lipitor [Atorvastatin] Other (See Comments)    Causes weakness and drowsiness  . Nsaids     Unknown reported by previous hospital    Current Facility-Administered Medications  Medication Dose Route Frequency Provider Last Rate Last Dose  . 0.9 %  sodium chloride infusion   Intravenous Continuous Shanda Howells, Cook      . albuterol (PROVENTIL) (5 MG/ML) 0.5% nebulizer solution 2.5 mg  2.5 mg Nebulization Q6H Shanda Howells, Cook      . calcium acetate (PHOSLO) capsule 1,334 mg   1,334 mg Oral TID WC Shanda Howells, Cook      . insulin aspart (novoLOG) injection 0-9 Units  0-9 Units Subcutaneous 6 times per day Shanda Howells, Cook      . ipratropium (ATROVENT HFA) inhaler 2 puff  2 puff Inhalation Q6H PRN Shanda Howells, Cook      . metoCLOPramide (REGLAN) tablet 5 mg  5 mg Oral BID Shanda Howells, Cook      . multivitamin (RENA-VIT) tablet 1 tablet  1 tablet Oral QHS Shanda Howells, Cook      . pantoprazole (PROTONIX) EC tablet 80 mg  80 mg Oral Daily Shanda Howells, Cook      . rOPINIRole (REQUIP) tablet 1 mg  1 mg Oral BID Shanda Howells, Cook       Review Of Systems: 12 point ROS negative except as noted above in HPI.  Physical Exam: Filed Vitals:   05/01/14 2250  BP: 147/73  Pulse: 83  Temp: 98.2 F (36.8 C)  Resp: 18    General: alert and cooperative HEENT: PERRLA and extra ocular movement intact Heart: S1, S2 normal, no murmur, rub or gallop, regular rate and rhythm Lungs: clear to auscultation, no wheezes or rales and unlabored breathing Abdomen: obese abdomen, + lower abd TTP, + bowel sounds  Extremities: extremities normal, atraumatic, no cyanosis or edema Skin:no rashes, no ecchymoses Neurology: normal without focal findings  Labs and Imaging: Pending    No results found.         Shanda Howells Cook  Pager: [phone]

## 2014-05-02 NOTE — Progress Notes (Signed)
Pt has arrived from [place]. Admissions paged

## 2014-05-03 DIAGNOSIS — K5732 Diverticulitis of large intestine without perforation or abscess without bleeding: Principal | ICD-10-CM

## 2014-05-03 DIAGNOSIS — N039 Chronic nephritic syndrome with unspecified morphologic changes: Secondary | ICD-10-CM

## 2014-05-03 DIAGNOSIS — D631 Anemia in chronic kidney disease: Secondary | ICD-10-CM

## 2014-05-03 DIAGNOSIS — Z992 Dependence on renal dialysis: Secondary | ICD-10-CM

## 2014-05-03 DIAGNOSIS — N186 End stage renal disease: Secondary | ICD-10-CM

## 2014-05-03 DIAGNOSIS — N189 Chronic kidney disease, unspecified: Secondary | ICD-10-CM

## 2014-05-03 LAB — CBC WITH DIFFERENTIAL/PLATELET
BASOS ABS: 0 10*3/uL (ref 0.0–0.1)
BASOS PCT: 0 % (ref 0–1)
Eosinophils Absolute: 0.1 10*3/uL (ref 0.0–0.7)
Eosinophils Relative: 1 % (ref 0–5)
HCT: 24.6 % — ABNORMAL LOW (ref 36.0–46.0)
HEMOGLOBIN: 7.1 g/dL — AB (ref 12.0–15.0)
Lymphocytes Relative: 12 % (ref 12–46)
Lymphs Abs: 1.4 10*3/uL (ref 0.7–4.0)
MCH: 29.8 pg (ref 26.0–34.0)
MCHC: 28.9 g/dL — ABNORMAL LOW (ref 30.0–36.0)
MCV: 103.4 fL — ABNORMAL HIGH (ref 78.0–100.0)
MONOS PCT: 9 % (ref 3–12)
Monocytes Absolute: 1 10*3/uL (ref 0.1–1.0)
NEUTROS PCT: 78 % — AB (ref 43–77)
Neutro Abs: 8.6 10*3/uL — ABNORMAL HIGH (ref 1.7–7.7)
Platelets: 173 10*3/uL (ref 150–400)
RBC: 2.38 MIL/uL — ABNORMAL LOW (ref 3.87–5.11)
RDW: 15.9 % — AB (ref 11.5–15.5)
WBC: 11.1 10*3/uL — ABNORMAL HIGH (ref 4.0–10.5)

## 2014-05-03 LAB — COMPREHENSIVE METABOLIC PANEL
ALK PHOS: 75 U/L (ref 39–117)
ALT: 6 U/L (ref 0–35)
AST: 11 U/L (ref 0–37)
Albumin: 2.2 g/dL — ABNORMAL LOW (ref 3.5–5.2)
Anion gap: 17 — ABNORMAL HIGH (ref 5–15)
BUN: 39 mg/dL — AB (ref 6–23)
CO2: 25 mEq/L (ref 19–32)
Calcium: 8.2 mg/dL — ABNORMAL LOW (ref 8.4–10.5)
Chloride: 93 mEq/L — ABNORMAL LOW (ref 96–112)
Creatinine, Ser: 6.07 mg/dL — ABNORMAL HIGH (ref 0.50–1.10)
GFR calc Af Amer: 7 mL/min — ABNORMAL LOW (ref >90)
GFR calc non Af Amer: 6 mL/min — ABNORMAL LOW (ref >90)
Glucose, Bld: 85 mg/dL (ref 70–99)
Potassium: 4.3 mEq/L (ref 3.7–5.3)
SODIUM: 135 meq/L — AB (ref 137–147)
TOTAL PROTEIN: 5.2 g/dL — AB (ref 6.0–8.3)
Total Bilirubin: 0.4 mg/dL (ref 0.3–1.2)

## 2014-05-03 LAB — GLUCOSE, CAPILLARY
GLUCOSE-CAPILLARY: 81 mg/dL (ref 70–99)
GLUCOSE-CAPILLARY: 133 mg/dL — AB (ref 70–99)
Glucose-Capillary: 81 mg/dL (ref 70–99)

## 2014-05-03 LAB — PREPARE RBC (CROSSMATCH)

## 2014-05-03 LAB — HEMOGLOBIN AND HEMATOCRIT, BLOOD
HEMATOCRIT: 33.3 % — AB (ref 36.0–46.0)
HEMOGLOBIN: 10.3 g/dL — AB (ref 12.0–15.0)

## 2014-05-03 LAB — OCCULT BLOOD X 1 CARD TO LAB, STOOL: FECAL OCCULT BLD: POSITIVE — AB

## 2014-05-03 MED ORDER — PENTAFLUOROPROP-TETRAFLUOROETH EX AERO: 1.0000 "application " | INHALATION_SPRAY | CUTANEOUS | Status: DC | PRN

## 2014-05-03 MED ORDER — DOXERCALCIFEROL 4 MCG/2ML IV SOLN
INTRAVENOUS | Status: AC
  Filled 2014-05-03: qty 2

## 2014-05-03 MED ORDER — DARBEPOETIN ALFA-POLYSORBATE 100 MCG/0.5ML IJ SOLN
INTRAMUSCULAR | Status: AC
  Filled 2014-05-03: qty 0.5

## 2014-05-03 MED ORDER — ALTEPLASE 2 MG IJ SOLR
2.0000 mg | Freq: Once | INTRAMUSCULAR | Status: DC | PRN
  Filled 2014-05-03: qty 2

## 2014-05-03 MED ORDER — SODIUM CHLORIDE 0.9 % IV SOLN: 100.0000 mL | INTRAVENOUS | Status: DC | PRN

## 2014-05-03 MED ORDER — NEPRO/CARBSTEADY PO LIQD
237.0000 mL | ORAL | Status: DC | PRN
  Filled 2014-05-03: qty 237

## 2014-05-03 MED ORDER — SODIUM CHLORIDE 0.9 % IV SOLN
Freq: Once | INTRAVENOUS | Status: DC
Start: 2014-05-03 — End: 2014-05-04

## 2014-05-03 MED ORDER — FUROSEMIDE 10 MG/ML IJ SOLN: 20.0000 mg | Freq: Once | INTRAMUSCULAR | Status: DC

## 2014-05-03 MED ORDER — DIPHENHYDRAMINE HCL 50 MG/ML IJ SOLN: 25.0000 mg | Freq: Once | INTRAMUSCULAR | Status: DC

## 2014-05-03 MED ORDER — LIDOCAINE HCL (PF) 1 % IJ SOLN: 5.0000 mL | INTRAMUSCULAR | Status: DC | PRN

## 2014-05-03 MED ORDER — ACETAMINOPHEN 325 MG PO TABS: 650.0000 mg | ORAL_TABLET | Freq: Once | ORAL | Status: DC

## 2014-05-03 MED ORDER — HEPARIN SODIUM (PORCINE) 1000 UNIT/ML DIALYSIS: 1000.0000 [IU] | INTRAMUSCULAR | Status: DC | PRN

## 2014-05-03 MED ORDER — LIDOCAINE-PRILOCAINE 2.5-2.5 % EX CREA: 1.0000 "application " | TOPICAL_CREAM | CUTANEOUS | Status: DC | PRN

## 2014-05-03 MED ORDER — DOXERCALCIFEROL 4 MCG/2ML IV SOLN
1.0000 ug | INTRAVENOUS | Status: DC
  Administered 2014-05-03: 1 ug via INTRAVENOUS

## 2014-05-03 NOTE — Procedures (Signed)
I have personally attended this patient's dialysis session. 2K2.25Ca bath Goal 2 liters R AVF 400 OLC green To get 2 units of PRBC's for Hb of 7.1 Tolerating well thus far    Jamal Maes, MD [place] Pager 05/03/2014, 8:49 AM

## 2014-05-03 NOTE — Progress Notes (Signed)
Pt fecal occult test came out positive. [place],PA made aware. We will continue to monitor.

## 2014-05-03 NOTE — Progress Notes (Signed)
PATIENT DETAILS Name: [name] Age: 73 y.o. Sex: female Date of Birth: 01-11-1941 Admit Date: 05/01/2014 Admitting Physician Shanda Howells, MD UYQ:[id number],VZDG, MD  Subjective: Doing better.  Abd pain is significantly improved.  Assessment/Plan: Active Problems:   Diverticulitis  1- Diverticulitis  -CT scan done in [facility] showing diverticulitis,also noted on most recent colonoscopy 12/2012 w/ internal external hemorrhoids, diverticulosis. -Admitted and placed on zosyn IV-day 2, tolerating clear liquids, abdomen very soft, will advance to full liquids today, and to soft diet in am. Suspect if clinical improvement, can be discharged in am. Remain afebrile and without any leukocytosis. -blood cultures are pending  2-Anemia  -Acute on chronic issue in setting of ESRD . No overt evidence of acute blood loss. -noted remote hx/o RCC in the past. No active lesions or pathology on imaging.  -Hgb today of 7.1.  Transfused 2 units of PRBC during HD on 8/5. Renal to dose Aranesp, IV Iron -Continue to follow.   3-ESRD  -HD MWF in [place]  - Nephrology consult apprecicated  4-Chronic Diastolic CHF  -diuresiss with HD -pending weight  -clinically euvolemic  -strict Is and Os.   5-HTN  -stable  -not on any antihypertensives at this time  6-COPD  -stable currently  -no resp complaints  -cont home prn nebs   7-DM  -SSI-CBG's stable. A1c 5.0. Only as needed Glipizide as outpatient.  FEN/GI: Advance diet to clear liquids   Prophylaxis: SCDs pending blood work   Disposition: pending further evaluation   Code Status:Full Code   Family Communication None at bedside  Procedures:  None   CONSULTS:  nephrology   MEDICATIONS: Scheduled Meds: . sodium chloride   Intravenous Once  . acetaminophen  650 mg Oral Once  . calcium acetate  1,334 mg Oral TID WC  . Chlorhexidine Gluconate Cloth  6 each Topical Q0600  . darbepoetin (ARANESP) injection - DIALYSIS   100 mcg Intravenous Q Wed-HD  . diphenhydrAMINE  25 mg Intravenous Once  . doxercalciferol  1 mcg Intravenous Q M,W,F-HD  . feeding supplement (RESOURCE BREEZE)  1 Container Oral BID BM  . insulin aspart  0-9 Units Subcutaneous TID AC & HS  . metoCLOPramide  5 mg Oral BID  . multivitamin  1 tablet Oral QHS  . mupirocin ointment  1 application Nasal BID  . pantoprazole  80 mg Oral Daily  . piperacillin-tazobactam (ZOSYN)  IV  2.25 g Intravenous Q8H  . rOPINIRole  1 mg Oral BID   Continuous Infusions:  PRN Meds:.acetaminophen, albuterol, ipratropium, ondansetron (ZOFRAN) IV  Antibiotics: Anti-infectives   Start     Dose/Rate Route Frequency Ordered Stop   05/02/14 0800  piperacillin-tazobactam (ZOSYN) IVPB 2.25 g     2.25 g 100 mL/hr over 30 Minutes Intravenous Every 8 hours 05/02/14 0048     05/02/14 0100  piperacillin-tazobactam (ZOSYN) IVPB 2.25 g     2.25 g 100 mL/hr over 30 Minutes Intravenous  Once 05/02/14 0048 05/02/14 0223       PHYSICAL EXAM: Vital signs in last 24 hours: Filed Vitals:   05/03/14 1023 05/03/14 1030 05/03/14 1130 05/03/14 1200  BP: 193/88 170/78 156/69 141/59  Pulse: 92 80 80 77  Temp: 98.8 F (37.1 C)   98.9 F (37.2 C)  TempSrc: Oral   Oral  Resp:    26  Height:      Weight:    72.5 kg (159 lb 13.3 oz)  SpO2:  99%    Weight change: 0.59 kg (1 lb 4.8 oz) Filed Weights   05/03/14 0500 05/03/14 0755 05/03/14 1200  Weight: 74.208 kg (163 lb 9.6 oz) 74.8 kg (164 lb 14.5 oz) 72.5 kg (159 lb 13.3 oz)   Body mass index is 27.42 kg/(m^2).   Gen Exam: Awake and alert with clear speech.   Neck: Supple, No JVD. Chest: B/L Clear.  No wheezes, crackles, or rhonchi noted CVS: S1 S2 Regular, no murmurs, gallops, or rubs noted Abdomen: soft, BS +, non distended, tender to palpation in lower abdomen Extremities: no edema, lower extremities warm to touch. Neurologic: Non Focal.   Skin: No Rash.  Wounds: N/A.   Intake/Output from previous  day:  Intake/Output Summary (Last 24 hours) at 05/03/14 1323 Last data filed at 05/03/14 1200  Gross per 24 hour  Intake   1897 ml  Output   2000 ml  Net   -103 ml     LAB RESULTS: CBC  Recent Labs Lab 05/02/14 0550 05/03/14 0550  WBC 12.1* 11.1*  HGB 7.2* 7.1*  HCT 24.8* 24.6*  PLT 191 173  MCV 102.9* 103.4*  MCH 29.9 29.8  MCHC 29.0* 28.9*  RDW 15.6* 15.9*  LYMPHSABS 1.3 1.4  MONOABS 1.1* 1.0  EOSABS 0.1 0.1  BASOSABS 0.0 0.0    Chemistries   Recent Labs Lab 05/02/14 0550 05/03/14 0550  NA 138 135*  K 3.3* 4.3  CL 93* 93*  CO2 28 25  GLUCOSE 89 85  BUN 27* 39*  CREATININE 4.68* 6.07*  CALCIUM 8.3* 8.2*    CBG:  Recent Labs Lab 05/02/14 0759 05/02/14 1152 05/02/14 1653 05/02/14 2117 05/03/14 1231  GLUCAP 98 176* 114* 77 81    GFR Estimated Creatinine Clearance: 8.2 ml/min (by C-G formula based on Cr of 6.07).  Coagulation profile No results found for this basename: INR, PROTIME,  in the last 168 hours  Cardiac Enzymes No results found for this basename: CK, CKMB, TROPONINI, MYOGLOBIN,  in the last 168 hours  No components found with this basename: POCBNP,  No results found for this basename: DDIMER,  in the last 72 hours  Recent Labs  05/02/14 0220  HGBA1C 5.0   No results found for this basename: CHOL, HDL, LDLCALC, TRIG, CHOLHDL, LDLDIRECT,  in the last 72 hours No results found for this basename: TSH, T4TOTAL, FREET3, T3FREE, THYROIDAB,  in the last 72 hours  Recent Labs  05/02/14 0220  VITAMINB12 655  FOLATE >20.0  FERRITIN 1181*  TIBC 145*  IRON 21*  RETICCTPCT 2.1   No results found for this basename: LIPASE, AMYLASE,  in the last 72 hours  Urine Studies No results found for this basename: UACOL, UAPR, USPG, UPH, UTP, UGL, UKET, UBIL, UHGB, UNIT, UROB, ULEU, UEPI, UWBC, URBC, UBAC, CAST, CRYS, UCOM, BILUA,  in the last 72 hours  MICROBIOLOGY: Recent Results (from the past 240 hour(s))  MRSA PCR SCREENING      Status: Abnormal   Collection Time    05/01/14 11:03 PM      Result Value Ref Range Status   MRSA by PCR POSITIVE (*) NEGATIVE Final   Comment:            The GeneXpert MRSA Assay (FDA     approved for NASAL specimens     only), is one component of a     comprehensive MRSA colonization     surveillance program. It is not     intended to diagnose  MRSA     infection nor to guide or     monitor treatment for     MRSA infections.     RESULT CALLED TO, READ BACK BY AND VERIFIED WITH:     A.MUHAMMED RN [id number] 05/02/14 E.GADDY  CULTURE, BLOOD (ROUTINE X 2)     Status: None   Collection Time    05/02/14  2:20 AM      Result Value Ref Range Status   Specimen Description BLOOD LEFT ANTECUBITAL   Final   Special Requests BOTTLES DRAWN AEROBIC AND ANAEROBIC 5CC   Final   Culture  Setup Time     Final   Value: 05/02/2014 08:26     Performed at [facility]   Culture     Final   Value:        BLOOD CULTURE RECEIVED NO GROWTH TO DATE CULTURE WILL BE HELD FOR 5 DAYS BEFORE ISSUING A FINAL NEGATIVE REPORT     Performed at [facility]   Report Status PENDING   Incomplete  CULTURE, BLOOD (ROUTINE X 2)     Status: None   Collection Time    05/02/14  2:30 AM      Result Value Ref Range Status   Specimen Description BLOOD LEFT HAND   Final   Special Requests BOTTLES DRAWN AEROBIC AND ANAEROBIC 5CC   Final   Culture  Setup Time     Final   Value: 05/02/2014 08:26     Performed at [facility]   Culture     Final   Value:        BLOOD CULTURE RECEIVED NO GROWTH TO DATE CULTURE WILL BE HELD FOR 5 DAYS BEFORE ISSUING A FINAL NEGATIVE REPORT     Performed at [facility]   Report Status PENDING   Incomplete    RADIOLOGY STUDIES/RESULTS: Dg Chest Port 1 View  04/19/2014   CLINICAL DATA:  Shortness of breath.  EXAM: PORTABLE CHEST - 1 VIEW  COMPARISON:  Chest radiograph April 17, 2014  FINDINGS: The cardiac silhouette appears mildly enlarged, similar. Calcified aortic  knob. Slightly increasing diffuse interstitial prominence, patchy left lower lobe airspace opacity in at least small left, minimal right pleural effusion. No pneumothorax.  Multiple EKG lines overlie the patient and may obscure subtle underlying pathology. Soft tissue planes and included osseous structures are nonsuspicious.  IMPRESSION: Stable cardiomegaly, slightly increasing interstitial prominence suggests pulmonary edema with patchy left lower lobe airspace opacity which may reflect atelectasis and/or confluent edema with left greater than right pleural effusions (minimal to small).   Electronically Signed   By: Elon Alas   On: 04/19/2014 05:20    Oren Binet, MD [facility] Pager:[phone]  If 7PM-7AM, please contact night-coverage [email or web address] Password [email or web address] 05/03/2014, 1:23 PM   LOS: 2 days   **Disclaimer: This note may have been dictated with voice recognition software. Similar sounding words can inadvertently be transcribed and this note may contain transcription errors which may not have been corrected upon publication of note.**

## 2014-05-03 NOTE — Progress Notes (Addendum)
[facility] Progress Note  Subjective:    Feeling some better Says when she sits down still "feels like a knife in by rectum" No visible blood in stool but FOB + Afebrile  Objective Filed Vitals:   05/03/14 0500 05/03/14 0714 05/03/14 0755 05/03/14 0800  BP:  146/65 126/60 156/72  Pulse:  77 78 76  Temp:  98.2 F (36.8 C) 98.3 F (36.8 C)   TempSrc:  Oral Oral   Resp:  20 22 24   Height:      Weight: 74.208 kg (163 lb 9.6 oz)  74.8 kg (164 lb 14.5 oz)   SpO2:  99% 95%    Physical Exam General: Very nice older WF  On dialysis NAD Heart:Regular S1S2 No S3 Lungs: Clear Abdomen: + BS  Mild lower and tenderness to palpation without guarding or rebound Extremities: No LE edema  Dialysis Access: Right AVF  Dialysis Orders:Center: [facility] MWF 4 hr EDW 75 180 400/A 1.5 2 K 2.25 Ca profile 2 no var Na AVF Aranesp 80 weekly last given 7/29 ; no Fe or Hectorol no heparin  Recent labs: Hgb 9.4 7/29 - ESA just increased from 60 q 2 weeks to 80 per week; iPTH 384 stable; gets +/- edw was 74.4 post HD 8/3 with UF 1.8 on 8/3    Recent Labs Lab 05/02/14 0550 05/03/14 0550  NA 138 135*  K 3.3* 4.3  CL 93* 93*  CO2 28 25  GLUCOSE 89 85  BUN 27* 39*  CREATININE 4.68* 6.07*  CALCIUM 8.3* 8.2*   Liver Function Tests:  Recent Labs Lab 05/02/14 0550 05/03/14 0550  AST 12 11  ALT 7 6  ALKPHOS 82 75  BILITOT 0.5 0.4  PROT 5.4* 5.2*  ALBUMIN 2.3* 2.2*   Recent Labs Lab 05/02/14 0550 05/03/14 0550  WBC 12.1* 11.1*  NEUTROABS 9.5* 8.6*  HGB 7.2* 7.1*  HCT 24.8* 24.6*  MCV 102.9* 103.4*  PLT 191 173   Blood Culture    Component Value Date/Time   SDES URINE, CLEAN CATCH 02/26/2014 0732   SPECREQUEST NONE 02/26/2014 0732   CULT  Value: NO GROWTH 5 DAYS Performed at [facility] 02/26/2014 0604   REPTSTATUS 02/27/2014 FINAL 02/26/2014 0732    Recent Labs Lab 05/02/14 0401 05/02/14 0759 05/02/14 1152 05/02/14 1653 05/02/14 2117  GLUCAP 96 98 176*  114* 77   Iron Studies:  Recent Labs  05/02/14 0220  IRON 21*  TIBC 145*  FERRITIN 1181*   Medications:   . sodium chloride   Intravenous Once  . acetaminophen  650 mg Oral Once  . calcium acetate  1,334 mg Oral TID WC  . Chlorhexidine Gluconate Cloth  6 each Topical Q0600  . darbepoetin (ARANESP) injection - DIALYSIS  100 mcg Intravenous Q Wed-HD  . diphenhydrAMINE  25 mg Intravenous Once  . feeding supplement (RESOURCE BREEZE)  1 Container Oral BID BM  . furosemide  20 mg Intravenous Once  . insulin aspart  0-9 Units Subcutaneous TID AC & HS  . metoCLOPramide  5 mg Oral BID  . multivitamin  1 tablet Oral QHS  . mupirocin ointment  1 application Nasal BID  . pantoprazole  80 mg Oral Daily  . piperacillin-tazobactam (ZOSYN)  IV  2.25 g Intravenous Q8H  . rOPINIRole  1 mg Oral BID   Assessment/Plan:   Acute diverticulitis - on Zosyn and appears to be clinically responding  ESRD - MWF - HD today - lower edw gently  Hypertension/volume - ok ; edw lowered 5 Kg since May   Anemia - Hgb 7.1 Trending down was 9.4 7/29 - tsat 47% 7/24; Fe 21 feritiin 1100 8/3. Hb 7.1 Transfuse 2 units on HD; will dose aranesp 100 8/5 (today)  Metabolic bone disease - iPTH 384 - not on hectorol and not sure why not other than goal at her facility is 150-600. Will start at 1 mcg TIW  Nutrition - CL - alb low - add resource - CL supplement   Contact isolation - hx MRSA/prior c diff   COPD - nebs plus chronic O2   DM - per primary   Afib - on amio - in NSR; previous dose was 400 at last discharge; per med rec says 200 starting 8/1; she does not know the name of her medicine - currently not receiving   PVD - s/o bilateral CEA   Jamal Maes, MD [place] Pager 05/03/2014, 9:19 AM

## 2014-05-04 DIAGNOSIS — Z992 Dependence on renal dialysis: Secondary | ICD-10-CM

## 2014-05-04 DIAGNOSIS — J9610 Chronic respiratory failure, unspecified whether with hypoxia or hypercapnia: Secondary | ICD-10-CM

## 2014-05-04 DIAGNOSIS — R0902 Hypoxemia: Secondary | ICD-10-CM

## 2014-05-04 DIAGNOSIS — N186 End stage renal disease: Secondary | ICD-10-CM

## 2014-05-04 DIAGNOSIS — I272 Pulmonary hypertension, unspecified: Secondary | ICD-10-CM | POA: Diagnosis present

## 2014-05-04 DIAGNOSIS — I509 Heart failure, unspecified: Secondary | ICD-10-CM | POA: Diagnosis present

## 2014-05-04 DIAGNOSIS — N2581 Secondary hyperparathyroidism of renal origin: Secondary | ICD-10-CM | POA: Diagnosis present

## 2014-05-04 DIAGNOSIS — K219 Gastro-esophageal reflux disease without esophagitis: Secondary | ICD-10-CM | POA: Diagnosis present

## 2014-05-04 DIAGNOSIS — E119 Type 2 diabetes mellitus without complications: Secondary | ICD-10-CM | POA: Diagnosis present

## 2014-05-04 DIAGNOSIS — J438 Other emphysema: Secondary | ICD-10-CM

## 2014-05-04 LAB — TYPE AND SCREEN
ABO/RH(D): O POS
ANTIBODY SCREEN: NEGATIVE
UNIT DIVISION: 0
Unit division: 0

## 2014-05-04 LAB — CBC WITH DIFFERENTIAL/PLATELET
BASOS PCT: 0 % (ref 0–1)
Basophils Absolute: 0 10*3/uL (ref 0.0–0.1)
Eosinophils Absolute: 0.2 10*3/uL (ref 0.0–0.7)
Eosinophils Relative: 1 % (ref 0–5)
HEMATOCRIT: 32.6 % — AB (ref 36.0–46.0)
HEMOGLOBIN: 9.7 g/dL — AB (ref 12.0–15.0)
LYMPHS ABS: 1.4 10*3/uL (ref 0.7–4.0)
Lymphocytes Relative: 12 % (ref 12–46)
MCH: 29.9 pg (ref 26.0–34.0)
MCHC: 29.8 g/dL — AB (ref 30.0–36.0)
MCV: 100.6 fL — ABNORMAL HIGH (ref 78.0–100.0)
MONO ABS: 1.1 10*3/uL — AB (ref 0.1–1.0)
MONOS PCT: 9 % (ref 3–12)
NEUTROS PCT: 78 % — AB (ref 43–77)
Neutro Abs: 9 10*3/uL — ABNORMAL HIGH (ref 1.7–7.7)
Platelets: 171 10*3/uL (ref 150–400)
RBC: 3.24 MIL/uL — AB (ref 3.87–5.11)
RDW: 16.5 % — ABNORMAL HIGH (ref 11.5–15.5)
WBC: 11.6 10*3/uL — ABNORMAL HIGH (ref 4.0–10.5)

## 2014-05-04 LAB — COMPREHENSIVE METABOLIC PANEL
ALBUMIN: 2.3 g/dL — AB (ref 3.5–5.2)
ALK PHOS: 80 U/L (ref 39–117)
ALT: 6 U/L (ref 0–35)
AST: 11 U/L (ref 0–37)
Anion gap: 15 (ref 5–15)
BUN: 15 mg/dL (ref 6–23)
CALCIUM: 8.8 mg/dL (ref 8.4–10.5)
CO2: 28 mEq/L (ref 19–32)
Chloride: 97 mEq/L (ref 96–112)
Creatinine, Ser: 3.88 mg/dL — ABNORMAL HIGH (ref 0.50–1.10)
GFR calc Af Amer: 12 mL/min — ABNORMAL LOW (ref >90)
GFR calc non Af Amer: 11 mL/min — ABNORMAL LOW (ref >90)
Glucose, Bld: 90 mg/dL (ref 70–99)
POTASSIUM: 4 meq/L (ref 3.7–5.3)
Sodium: 140 mEq/L (ref 137–147)
TOTAL PROTEIN: 5.8 g/dL — AB (ref 6.0–8.3)
Total Bilirubin: 0.5 mg/dL (ref 0.3–1.2)

## 2014-05-04 LAB — GLUCOSE, CAPILLARY
GLUCOSE-CAPILLARY: 89 mg/dL (ref 70–99)
GLUCOSE-CAPILLARY: 116 mg/dL — AB (ref 70–99)

## 2014-05-04 LAB — CLOSTRIDIUM DIFFICILE BY PCR: Toxigenic C. Difficile by PCR: NEGATIVE

## 2014-05-04 LAB — PHOSPHORUS: Phosphorus: 4 mg/dL (ref 2.3–4.6)

## 2014-05-04 MED ORDER — METRONIDAZOLE 500 MG PO TABS
500.0000 mg | ORAL_TABLET | Freq: Four times a day (QID) | ORAL | Status: DC
  Administered 2014-05-04: 500 mg via ORAL
  Filled 2014-05-04 (×4): qty 1

## 2014-05-04 MED ORDER — OMEPRAZOLE 40 MG PO CPDR: 40.0000 mg | DELAYED_RELEASE_CAPSULE | Freq: Every day | ORAL | Status: DC

## 2014-05-04 MED ORDER — RENA-VITE PO TABS: 1.0000 | ORAL_TABLET | Freq: Every day | ORAL | Status: DC

## 2014-05-04 MED ORDER — CIPROFLOXACIN HCL 500 MG PO TABS: 500.0000 mg | ORAL_TABLET | Freq: Two times a day (BID) | ORAL | Status: DC

## 2014-05-04 MED ORDER — METRONIDAZOLE 500 MG PO TABS: 500.0000 mg | ORAL_TABLET | Freq: Three times a day (TID) | ORAL | Status: DC

## 2014-05-04 MED ORDER — CIPROFLOXACIN HCL 500 MG PO TABS
500.0000 mg | ORAL_TABLET | ORAL | Status: DC
  Administered 2014-05-04: 500 mg via ORAL
  Filled 2014-05-04: qty 1

## 2014-05-04 NOTE — H&P (Deleted)
Discharge Summary  PATIENT DETAILS Name: [name] Age: 73 y.o. Sex: female Date of Birth: [birth year] MRN: [id number]. Admit Date: 05/01/2014 Admitting Physician: Shanda Howells, MD FUX:NATFTD,DUKG, MD  Recommendations for Outpatient Follow-up:  1. Discharging on 7 days of Cipro/Flagyl.   2. Continue a soft diet (ex. Mash potatoes, mac & cheese, apple sauce) for a period of 1 week 3. Follow up with PCP if symptoms worsened or reoccur  PRIMARY DISCHARGE DIAGNOSIS:  Active Problems:   Diverticulitis   ESRD on dialysis   Type II diabetes mellitus   CHF (congestive heart failure)   GERD (gastroesophageal reflux disease)   Pulmonary hypertension   Secondary hyperparathyroidism, renal      PAST MEDICAL HISTORY: Past Medical History  Diagnosis Date  . Carotid artery occlusion     bilat CEA in 2012  . C. 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 Hypertension   . Atrial fibrillation April  2013  . Pulmonary hypertension   . Secondary hyperparathyroidism, renal   . COPD (chronic obstructive pulmonary disease)   . Hypercholesterolemia   . History of MI (myocardial infarction)     "they say I had 2 years ago" (08/20/2012)  . GERD (gastroesophageal reflux disease)   . History of pneumonia     "have it alot" (08/20/2012)  . Sleep apnea     "don't wear mask anymore"  . Type II diabetes mellitus   . History of blood transfusion 2013    "3 times so far in 2013:  4 pints one time; 2 pints another; ?# last time" (08/20/2012)  . Iron deficiency anemia   . Seizures 2012    after carotid surgery  . ESRD on dialysis     "[place]; Tues, Thurs; Sat"  . On home oxygen therapy     "24/7"   . Acute diverticulitis     Oct 2013, treated medically [facility], sigmoid diverticulitits  . CHF (congestive heart failure)   . Coronary artery disease   . Seizure disorder March 2011  . Myocardial infarction   . Renal cell carcinoma 2005?  Marland Kitchen Cancer of kidney 2005     DISCHARGE MEDICATIONS:   Medication List         amiodarone 200 MG tablet  Commonly known as:  PACERONE  Take 200 mg by mouth daily.     calcium acetate 667 MG capsule  Commonly known as:  PHOSLO  Take 1,334 mg by mouth 3 (three) times daily with meals.     ciprofloxacin 500 MG tablet  Commonly known as:  CIPRO  Take 1 tablet (500 mg total) by mouth 2 (two) times daily.     glipiZIDE 2.5 MG 24 hr tablet  Commonly known as:  GLUCOTROL XL  Take 2.5 mg by mouth daily as needed. Take when sugar is 150 or greater     ipratropium 17 MCG/ACT inhaler  Commonly known as:  ATROVENT HFA  Inhale 2 puffs into the lungs every 6 (six) hours as needed for wheezing.     levalbuterol 1.25 MG/3ML nebulizer solution  Commonly known as:  XOPENEX  Take 1.25 mg by nebulization every 4 (four) hours as needed for wheezing.     metoCLOPramide 5 MG tablet  Commonly known as:  REGLAN  Take 5 mg by mouth 2 (two) times daily.     metroNIDAZOLE 500 MG tablet  Commonly known as:  FLAGYL  Take 1 tablet (500 mg total) by mouth 3 (three)  times daily.     multivitamin Tabs tablet  Take 1 tablet by mouth at bedtime.     omeprazole 40 MG capsule  Commonly known as:  PRILOSEC  Take 1 capsule (40 mg total) by mouth daily.     rOPINIRole 1 MG tablet  Commonly known as:  REQUIP  Take 1 mg by mouth 2 (two) times daily.        ALLERGIES:   Allergies  Allergen Reactions  . Morphine And Related Nausea And Vomiting  . Lipitor [Atorvastatin] Other (See Comments)    Causes weakness and drowsiness  . Nsaids     Unknown reported by previous hospital    BRIEF HPI:  See H&P, Labs, Consult and Test reports for all details.  In brief, patient is a 73 y.o. year old female with significant past medical history of multiple medical problems including ESRD w/ HD MWF, HTN, COPD, CHF, diverticulosis.  She was recently noted to have been admitted for acute respiratory failure, COPD 2/2, and volume overload  from 07/22 to 7/23.  She went to her PCP with progressively worsening lower abdominal pain that started a couple of days back.  She is a dialysis patient and reported pain while at dialysis.  Patient was sent to [facility] ER and was noted to have Diverticulitis on CT scan.  Also noted to have worsening anemia 9.8 to 7.8 over a 6 week span.  Denied any dysuria, hematuria, eating nuts or seeds.  CONSULTATIONS:   nephrology  PERTINENT RADIOLOGIC STUDIES: CT abdomen pelvis performed at [facility] ER.    PERTINENT LAB RESULTS: CBC:  Recent Labs  05/03/14 0550 05/03/14 1335 05/04/14 0655  WBC 11.1*  --  11.6*  HGB 7.1* 10.3* 9.7*  HCT 24.6* 33.3* 32.6*  PLT 173  --  171   CMET CMP     Component Value Date/Time   NA 140 05/04/2014 0655   K 4.0 05/04/2014 0655   CL 97 05/04/2014 0655   CO2 28 05/04/2014 0655   GLUCOSE 90 05/04/2014 0655   BUN 15 05/04/2014 0655   CREATININE 3.88* 05/04/2014 0655   CALCIUM 8.8 05/04/2014 0655   PROT 5.8* 05/04/2014 0655   ALBUMIN 2.3* 05/04/2014 0655   AST 11 05/04/2014 0655   ALT 6 05/04/2014 0655   ALKPHOS 80 05/04/2014 0655   BILITOT 0.5 05/04/2014 0655   GFRNONAA 11* 05/04/2014 0655   GFRAA 12* 05/04/2014 0655     Recent Labs  05/02/14 0220  HGBA1C 5.0    Recent Labs  05/02/14 0220  VITAMINB12 655  FOLATE >20.0  FERRITIN 1181*  TIBC 145*  IRON 21*  RETICCTPCT 2.1   Microbiology: Recent Results (from the past 240 hour(s))  MRSA PCR SCREENING     Status: Abnormal   Collection Time    05/01/14 11:03 PM      Result Value Ref Range Status   MRSA by PCR POSITIVE (*) NEGATIVE Final   Comment:            The GeneXpert MRSA Assay (FDA     approved for NASAL specimens     only), is one component of a     comprehensive MRSA colonization     surveillance program. It is not     intended to diagnose MRSA     infection nor to guide or     monitor treatment for     MRSA infections.     RESULT CALLED TO, READ BACK BY AND VERIFIED WITH:  A.[facility]  RN 0217 05/02/14 E.GADDY  CULTURE, BLOOD (ROUTINE X 2)     Status: None   Collection Time    05/02/14  2:20 AM      Result Value Ref Range Status   Specimen Description BLOOD LEFT ANTECUBITAL   Final   Special Requests BOTTLES DRAWN AEROBIC AND ANAEROBIC 5CC   Final   Culture  Setup Time     Final   Value: 05/02/2014 08:26     Performed at [facility]   Culture     Final   Value:        BLOOD CULTURE RECEIVED NO GROWTH TO DATE CULTURE WILL BE HELD FOR 5 DAYS BEFORE ISSUING A FINAL NEGATIVE REPORT     Performed at [facility]   Report Status PENDING   Incomplete  CULTURE, BLOOD (ROUTINE X 2)     Status: None   Collection Time    05/02/14  2:30 AM      Result Value Ref Range Status   Specimen Description BLOOD LEFT HAND   Final   Special Requests BOTTLES DRAWN AEROBIC AND ANAEROBIC 5CC   Final   Culture  Setup Time     Final   Value: 05/02/2014 08:26     Performed at [facility]   Culture     Final   Value:        BLOOD CULTURE RECEIVED NO GROWTH TO DATE CULTURE WILL BE HELD FOR 5 DAYS BEFORE ISSUING A FINAL NEGATIVE REPORT     Performed at [facility]   Report Status PENDING   Incomplete  CLOSTRIDIUM DIFFICILE BY PCR     Status: None   Collection Time    05/03/14 10:05 PM      Result Value Ref Range Status   C difficile by pcr NEGATIVE  NEGATIVE Final     BRIEF HOSPITAL COURSE:  Active Problems:  1- Diverticulitis  -CT scan done in [place] ER showed diverticulitis -Most recent colonoscopy 12/2012 showed internal external hemorrhoids and diverticulosis.  -Admitted and placed on Zosyn IV -Put on clear liquids which she tolerated well and was advanced to full liquids and then to a soft diet. -Remained afebrile on day of discharge and blood cultures showed no growth till date -Will discharge on 7 additional days of Ciprofloxacin and Flagyl for a total of 10 days of antibiotics.  2-Anemia  -Acute on chronic issue in setting of ESRD . There was  no overt evidence of acute blood loss.  -noted remote hx/o RCC in the past. No active lesions or pathology on imaging.  -Hgb of 7.1 on 08/05 and was transfused 2 units of PRBC during HD.  Hgb up to 9.7 at discharge. -Renal to dose Aranesp, IV Iron   3-ESRD  -HD MWF in [place]  -Nephrology was consulted and followed the patient thru out her stay. -Creatinine  3.88 on discharge. -Per nephrology   4-Chronic Diastolic CHF  -diuresis with HD  -daily weights were taken -was clinically euvolemic  -strict Is and Os monitored  5-HTN  -stable  -was not on any antihypertensives at this time   6-COPD  -was stable through hospital course -had no resp complaints  -continued on home prn nebs  -Remains on oxygen outpatient as she was prior to admission.  7-DM  -was on SSI-CBG's stable. A1c 5.0.  -discharged on home regimen of Glipizide as outpatient.    TODAY-DAY OF DISCHARGE:  Subjective:   [name] today has  no headache, no chest or abdominal pain, no new weakness, tingling or numbness, feels much better wants to go home today. States her abdominal pain has resolved.  No pain or N/V with diet.  Complains of loose bowel movements this morning but C. Diff results were negative. Her appetite has been fine and has been ambulating without any issues.  Objective:   Blood pressure 136/64, pulse 80, temperature 98.2 F (36.8 C), temperature source Oral, resp. rate 20, height 5\' 4"  (1.626 m), weight 74.2 kg (163 lb 9.3 oz), SpO2 95.00%. No intake or output data in the 24 hours ending 05/04/14 1233 Filed Weights   05/03/14 0755 05/03/14 1200 05/04/14 0405  Weight: 74.8 kg (164 lb 14.5 oz) 72.5 kg (159 lb 13.3 oz) 74.2 kg (163 lb 9.3 oz)    Exam Gen Exam: Awake and alert with clear speech. Sitting up in the chair. Neck: Supple, No JVD. No lymphadenopathy  Chest: decreased breath sounds, but No wheezes, crackles, or rhonchi noted  CVS: S1 S2 Regular, no murmurs, gallops, or rubs noted   Abdomen: soft, BS +, non distended, tender to palpation in lower abdomen  Extremities: no edema, lower extremities warm to touch.  Neurologic: Non Focal.    DISCHARGE CONDITION: Stable  DISPOSITION: Home  DISCHARGE INSTRUCTIONS:    Activity:  As tolerated, no heavy lifting  Diet recommendation: Soft diet for 1 week, then a diabetic/heart-healthy diet    Discharge Instructions   Activity as tolerated - No restrictions    Complete by:  As directed      Call MD for:  persistant nausea and vomiting    Complete by:  As directed      Call MD for:  severe uncontrolled pain    Complete by:  As directed      Diet - low sodium heart healthy    Complete by:  As directed      Diet Carb Modified    Complete by:  As directed            Follow-up Information   Follow up with Gilford Rile, MD In 1 week. (As needed, If symptoms worsen)    Specialty:  Internal Medicine   Contact information:   [place] [address] [phone]         Total Time spent on discharge equals 45 minutes.  Signed: Zannie Kehr, [place], [place] [facility] [phone] 05/04/2014 12:33 PM  **Disclaimer: This note may have been dictated with voice recognition software. Similar sounding words can inadvertently be transcribed and this note may contain transcription errors which may not have been corrected upon publication of note.**

## 2014-05-04 NOTE — Progress Notes (Signed)
[facility] Progress Note  Subjective:    Continues to feel better Abdomen still "a little sore" and says "hemorrhoids are bothering her" Stool pasty this AM Remains afebrile Tolerated HD well yesterday Was transfused 2 units of PRBC's with HD yesterday (8/5) Eating a solid breakfast  Objective Filed Vitals:   05/03/14 1200 05/03/14 1426 05/03/14 2101 05/04/14 0405  BP: 141/59 142/66 139/65 136/64  Pulse: 77 92 81 80  Temp: 98.9 F (37.2 C) 98.5 F (36.9 C) 98.8 F (37.1 C) 98.2 F (36.8 C)  TempSrc: Oral Oral Oral Oral  Resp: 26 20 20 20   Height:      Weight: 72.5 kg (159 lb 13.3 oz)   74.2 kg (163 lb 9.3 oz)  SpO2: 99% 93% 96% 95%   Physical Exam General: Very nice older WF  Wearing O2 (chronic) Heart:Regular S1S2 No S3 Lungs: Clear Abdomen: + BS  Scars from old surgeries.  Minimal lower and tenderness to palpation without guarding or rebound Extremities: No LE edema  Dialysis Access: Right AVF + bruit and thrill  Dialysis Orders:Center: [facility] MWF 4 hr EDW 75 180 400/A 1.5 2 K 2.25 Ca profile 2 no var Na AVF Aranesp 80 weekly last given 7/29 ; no Fe or Hectorol no heparin  Recent labs: Hgb 9.4 7/29 - ESA just increased from 60 q 2 weeks to 80 per week; iPTH 384 stable; gets +/- edw was 74.4 post HD 8/3 with UF 1.8 on 8/3   Weight Trending 05/03/14 1200 72.5 kg (159 lb 13.3 oz)   Post HD 05/03/14 0755 74.8 kg (164 lb 14.5 oz)   Pre HD    Recent Labs Lab 05/02/14 0550 05/03/14 0550  NA 138 135*  K 3.3* 4.3  CL 93* 93*  CO2 28 25  GLUCOSE 89 85  BUN 27* 39*  CREATININE 4.68* 6.07*  CALCIUM 8.3* 8.2*   Liver Function Tests:  Recent Labs Lab 05/02/14 0550 05/03/14 0550  AST 12 11  ALT 7 6  ALKPHOS 82 75  BILITOT 0.5 0.4  PROT 5.4* 5.2*  ALBUMIN 2.3* 2.2*    Recent Labs Lab 05/02/14 0550 05/03/14 0550 05/03/14 1335 05/04/14 0655  WBC 12.1* 11.1*  --  11.6*  NEUTROABS 9.5* 8.6*  --  9.0*  HGB 7.2* 7.1* 10.3* 9.7*  HCT 24.8*  24.6* 33.3* 32.6*  MCV 102.9* 103.4*  --  100.6*  PLT 191 173  --  171   Blood Culture    Component Value Date/Time   SDES BLOOD LEFT HAND 05/02/2014 0230   SPECREQUEST BOTTLES DRAWN AEROBIC AND ANAEROBIC 5CC 05/02/2014 0230   CULT  Value:        BLOOD CULTURE RECEIVED NO GROWTH TO DATE CULTURE WILL BE HELD FOR 5 DAYS BEFORE ISSUING A FINAL NEGATIVE REPORT Performed at [facility] 05/02/2014 0230   REPTSTATUS PENDING 05/02/2014 0230    Recent Labs Lab 05/02/14 2117 05/03/14 1231 05/03/14 1659 05/03/14 2130 05/04/14 0750  GLUCAP 77 81 133* 81 89   Iron Studies:   Recent Labs  05/02/14 0220  IRON 21*  TIBC 145*  FERRITIN 1181*   Medications:   . sodium chloride   Intravenous Once  . acetaminophen  650 mg Oral Once  . calcium acetate  1,334 mg Oral TID WC  . Chlorhexidine Gluconate Cloth  6 each Topical Q0600  . darbepoetin (ARANESP) injection - DIALYSIS  100 mcg Intravenous Q Wed-HD  . diphenhydrAMINE  25 mg Intravenous Once  .  doxercalciferol  1 mcg Intravenous Q M,W,F-HD  . feeding supplement (RESOURCE BREEZE)  1 Container Oral BID BM  . insulin aspart  0-9 Units Subcutaneous TID AC & HS  . metoCLOPramide  5 mg Oral BID  . multivitamin  1 tablet Oral QHS  . mupirocin ointment  1 application Nasal BID  . pantoprazole  80 mg Oral Daily  . piperacillin-tazobactam (ZOSYN)  IV  2.25 g Intravenous Q8H  . rOPINIRole  1 mg Oral BID   Assessment/Plan:   Acute diverticulitis - on Zosyn and clinically responding.  Diet has been advanced to solids  ESRD - MWF - HD yesterday - will be due for tomorrow if still here. No heparin. Will have lower EDW at discharge. Got to 72.5 yesterday with no hypotension. Will put in orders for tomorrow in case still here  Hypertension/volume - ok ; edw lowered 5 Kg since May and as above will be lower at discharge  Anemia - Hgb 7.1 Trending down was 9.4 7/29 - tsat 47% 7/24; Fe 21 feritiin 1100 8/3. Hb 7.1 yesterday -> Transfused 2 units  on HD with post transfusion Hb up to 9.7 today;  dosed aranesp at 865 8/5   Metabolic bone disease - iPTH 384 - not on hectorol and not sure why not other than goal at her facility is 150-600. Started at 1 mcg TIW (8/5)  Nutrition - CL - alb low - add resource - CL supplement   Contact isolation - hx MRSA/prior c diff   COPD - nebs plus chronic O2   DM - per primary   Afib - on amio - in NSR; previous dose was 400 at last discharge; per med rec says 200 starting 8/1; she does not know the name of her medicine - currently not receiving   PVD - s/o bilateral CEA  Dispo - she tells me she will likely go home if tolerates solid food today   Jamal Maes, MD [place] Pager 05/04/2014, 8:50 AM

## 2014-05-04 NOTE — Discharge Instructions (Signed)
Please complete 7 days of cipro and flagyl antibiotics.    Continue with a soft diet until abdominal pain has completely resolved.

## 2014-05-04 NOTE — Discharge Summary (Signed)
Discharge Summary  PATIENT DETAILS Name: [name] Age: 73 y.o. Sex: female Date of Birth: 12/25/1940 MRN: [id number]. Admit Date: 05/01/2014 Admitting Physician: Shanda Howells, MD TLX:[id number],TDHR, MD  Recommendations for Outpatient Follow-up:  1. Discharging on 7 days of Cipro/Flagyl.   2. Continue a soft diet (ex. Mash potatoes, mac & cheese, apple sauce) for a period of 1 week 3. Follow up with PCP if symptoms worsened or reoccur  PRIMARY DISCHARGE DIAGNOSIS:  Active Problems:   Diverticulitis   ESRD on dialysis   Type II diabetes mellitus   CHF (congestive heart failure)   GERD (gastroesophageal reflux disease)   Pulmonary hypertension   Secondary hyperparathyroidism, renal      PAST MEDICAL HISTORY: Past Medical History  Diagnosis Date  . Carotid artery occlusion     bilat CEA in 2012  . C. 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 Hypertension   . Atrial fibrillation April  2013  . Pulmonary hypertension   . Secondary hyperparathyroidism, renal   . COPD (chronic obstructive pulmonary disease)   . Hypercholesterolemia   . History of MI (myocardial infarction)     "they say I had 2 years ago" (08/20/2012)  . GERD (gastroesophageal reflux disease)   . History of pneumonia     "have it alot" (08/20/2012)  . Sleep apnea     "don't wear mask anymore"  . Type II diabetes mellitus   . History of blood transfusion 2013    "3 times so far in 2013:  4 pints one time; 2 pints another; ?# last time" (08/20/2012)  . Iron deficiency anemia   . Seizures 2012    after carotid surgery  . ESRD on dialysis     "[facility]; Tues, Thurs; Sat"  . On home oxygen therapy     "24/7"   . Acute diverticulitis     Oct 2013, treated medically [facility], sigmoid diverticulitits  . CHF (congestive heart failure)   . Coronary artery disease   . Seizure disorder March 2011  . Myocardial infarction   . Renal cell carcinoma 2005?  Marland Kitchen Cancer of kidney 2005      DISCHARGE MEDICATIONS:   Medication List         amiodarone 200 MG tablet  Commonly known as:  PACERONE  Take 200 mg by mouth daily.     calcium acetate 667 MG capsule  Commonly known as:  PHOSLO  Take 1,334 mg by mouth 3 (three) times daily with meals.     ciprofloxacin 500 MG tablet  Commonly known as:  CIPRO  Take 1 tablet (500 mg total) by mouth 2 (two) times daily.     glipiZIDE 2.5 MG 24 hr tablet  Commonly known as:  GLUCOTROL XL  Take 2.5 mg by mouth daily as needed. Take when sugar is 150 or greater     ipratropium 17 MCG/ACT inhaler  Commonly known as:  ATROVENT HFA  Inhale 2 puffs into the lungs every 6 (six) hours as needed for wheezing.     levalbuterol 1.25 MG/3ML nebulizer solution  Commonly known as:  XOPENEX  Take 1.25 mg by nebulization every 4 (four) hours as needed for wheezing.     metoCLOPramide 5 MG tablet  Commonly known as:  REGLAN  Take 5 mg by mouth 2 (two) times daily.     metroNIDAZOLE 500 MG tablet  Commonly known as:  FLAGYL  Take 1 tablet (500 mg total) by mouth 3 (  three) times daily.     multivitamin Tabs tablet  Take 1 tablet by mouth at bedtime.     omeprazole 40 MG capsule  Commonly known as:  PRILOSEC  Take 1 capsule (40 mg total) by mouth daily.     rOPINIRole 1 MG tablet  Commonly known as:  REQUIP  Take 1 mg by mouth 2 (two) times daily.        ALLERGIES:   Allergies  Allergen Reactions  . Morphine And Related Nausea And Vomiting  . Lipitor [Atorvastatin] Other (See Comments)    Causes weakness and drowsiness  . Nsaids     Unknown reported by previous hospital    BRIEF HPI:  See H&P, Labs, Consult and Test reports for all details.  In brief, patient is a 73 y.o. year old female with significant past medical history of multiple medical problems including ESRD w/ HD MWF, HTN, COPD, CHF, diverticulosis.  She was recently noted to have been admitted for acute respiratory failure, COPD 2/2, and volume overload  from 07/22 to 7/23.  She went to her PCP with progressively worsening lower abdominal pain that started a couple of days back.  She is a dialysis patient and reported pain while at dialysis.  Patient was sent to [facility] ER and was noted to have Diverticulitis on CT scan.  Also noted to have worsening anemia 9.8 to 7.8 over a 6 week span.  Denied any dysuria, hematuria, eating nuts or seeds.  CONSULTATIONS:   nephrology  PERTINENT RADIOLOGIC STUDIES: CT abdomen pelvis performed at [facility] ER.    PERTINENT LAB RESULTS: CBC:  Recent Labs  05/03/14 0550 05/03/14 1335 05/04/14 0655  WBC 11.1*  --  11.6*  HGB 7.1* 10.3* 9.7*  HCT 24.6* 33.3* 32.6*  PLT 173  --  171   CMET CMP     Component Value Date/Time   NA 140 05/04/2014 0655   K 4.0 05/04/2014 0655   CL 97 05/04/2014 0655   CO2 28 05/04/2014 0655   GLUCOSE 90 05/04/2014 0655   BUN 15 05/04/2014 0655   CREATININE 3.88* 05/04/2014 0655   CALCIUM 8.8 05/04/2014 0655   PROT 5.8* 05/04/2014 0655   ALBUMIN 2.3* 05/04/2014 0655   AST 11 05/04/2014 0655   ALT 6 05/04/2014 0655   ALKPHOS 80 05/04/2014 0655   BILITOT 0.5 05/04/2014 0655   GFRNONAA 11* 05/04/2014 0655   GFRAA 12* 05/04/2014 0655     Recent Labs  05/02/14 0220  HGBA1C 5.0    Recent Labs  05/02/14 0220  VITAMINB12 655  FOLATE >20.0  FERRITIN 1181*  TIBC 145*  IRON 21*  RETICCTPCT 2.1   Microbiology: Recent Results (from the past 240 hour(s))  MRSA PCR SCREENING     Status: Abnormal   Collection Time    05/01/14 11:03 PM      Result Value Ref Range Status   MRSA by PCR POSITIVE (*) NEGATIVE Final   Comment:            The GeneXpert MRSA Assay (FDA     approved for NASAL specimens     only), is one component of a     comprehensive MRSA colonization     surveillance program. It is not     intended to diagnose MRSA     infection nor to guide or     monitor treatment for     MRSA infections.     RESULT CALLED TO, READ BACK BY AND VERIFIED  WITH:     A.MUHAMMED  RN [id number] 05/02/14 E.GADDY  CULTURE, BLOOD (ROUTINE X 2)     Status: None   Collection Time    05/02/14  2:20 AM      Result Value Ref Range Status   Specimen Description BLOOD LEFT ANTECUBITAL   Final   Special Requests BOTTLES DRAWN AEROBIC AND ANAEROBIC 5CC   Final   Culture  Setup Time     Final   Value: 05/02/2014 08:26     Performed at [facility]   Culture     Final   Value:        BLOOD CULTURE RECEIVED NO GROWTH TO DATE CULTURE WILL BE HELD FOR 5 DAYS BEFORE ISSUING A FINAL NEGATIVE REPORT     Performed at [facility]   Report Status PENDING   Incomplete  CULTURE, BLOOD (ROUTINE X 2)     Status: None   Collection Time    05/02/14  2:30 AM      Result Value Ref Range Status   Specimen Description BLOOD LEFT HAND   Final   Special Requests BOTTLES DRAWN AEROBIC AND ANAEROBIC 5CC   Final   Culture  Setup Time     Final   Value: 05/02/2014 08:26     Performed at [facility]   Culture     Final   Value:        BLOOD CULTURE RECEIVED NO GROWTH TO DATE CULTURE WILL BE HELD FOR 5 DAYS BEFORE ISSUING A FINAL NEGATIVE REPORT     Performed at [facility]   Report Status PENDING   Incomplete  CLOSTRIDIUM DIFFICILE BY PCR     Status: None   Collection Time    05/03/14 10:05 PM      Result Value Ref Range Status   C difficile by pcr NEGATIVE  NEGATIVE Final     BRIEF HOSPITAL COURSE:  Active Problems:  1- Diverticulitis  -CT scan done in [place] ER showed diverticulitis -Most recent colonoscopy 12/2012 showed internal external hemorrhoids and diverticulosis.  -Admitted and placed on Zosyn IV -Put on clear liquids which she tolerated well and was advanced to full liquids and then to a soft diet which the patient tolerated -Remained afebrile on day of discharge and blood cultures showed no growth at time of d/c -Will discharge on 7 additional days of Ciprofloxacin and Flagyl for a total of 10 days of antibiotics.  2-Anemia  -Acute on chronic  issue in setting of ESRD . There was no overt evidence of acute blood loss.  -noted remote hx/o RCC in the past. No active lesions or pathology on imaging.  -Hgb of 7.1 on 08/05 and was transfused 2 units of PRBC during HD.  Hgb up to 9.7 at discharge. -Renal to dose Aranesp, IV Iron   3-ESRD  -HD MWF in [place]  -Nephrology was consulted and followed the patient thru out her stay. -Creatinine  3.88 on discharge. -Per nephrology   4-Chronic Diastolic CHF  -diuresis with HD  -daily weights were taken -was clinically euvolemic  -strict Is and Os monitored  5-HTN  -stable  -was not on any antihypertensives at this time   6-COPD/Chronic Respiratory Failure  -was stable through hospital course -had no resp complaints  -continued on home prn nebs  -Remains on oxygen outpatient (3L) as she was prior to admission.  7-DM  -was on SSI-CBG's stable. A1c 5.0.  -discharged on home regimen of Glipizide as outpatient.  TODAY-DAY OF DISCHARGE:  Subjective:   Lea Baine today has no headache, no chest or abdominal pain, no new weakness, tingling or numbness, feels much better wants to go home today. States her abdominal pain has resolved.  No pain or N/V with diet.  Complains of loose bowel movements this morning but C. Diff results were negative. Her appetite has been fine and has been ambulating without any issues.  Objective:   Blood pressure 136/64, pulse 80, temperature 98.2 F (36.8 C), temperature source Oral, resp. rate 20, height 5\' 4"  (1.626 m), weight 74.2 kg (163 lb 9.3 oz), SpO2 95.00%. No intake or output data in the 24 hours ending 05/04/14 1233 Filed Weights   05/03/14 0755 05/03/14 1200 05/04/14 0405  Weight: 74.8 kg (164 lb 14.5 oz) 72.5 kg (159 lb 13.3 oz) 74.2 kg (163 lb 9.3 oz)    Exam Gen Exam: Awake and alert with clear speech. Sitting up in the chair. Neck: Supple, No JVD. No lymphadenopathy  Chest: decreased breath sounds.  fine bibasilar crackles  CVS:  S1 S2 Regular, no murmurs, gallops, or rubs noted  Abdomen: soft, BS +, non distended, tender to palpation in lower abdomen  Extremities: no edema, lower extremities warm to touch.  Neurologic: Non Focal.    DISCHARGE CONDITION: Stable  DISPOSITION: Home  DISCHARGE INSTRUCTIONS:    Activity:  As tolerated, no heavy lifting  Diet recommendation: Soft diet for 1 week, then a diabetic/heart-healthy diet    Discharge Instructions   Activity as tolerated - No restrictions    Complete by:  As directed      Call MD for:  persistant nausea and vomiting    Complete by:  As directed      Call MD for:  severe uncontrolled pain    Complete by:  As directed      Diet - low sodium heart healthy    Complete by:  As directed      Diet Carb Modified    Complete by:  As directed            Follow-up Information   Follow up with Gilford Rile, MD In 1 week. (As needed, If symptoms worsen)    Specialty:  Internal Medicine   Contact information:   [place] [id number] [phone]         Total Time spent on discharge equals 45 minutes.  Signed: Zannie Kehr, [place], [place] [facility] [phone] 05/04/2014 12:33 PM  **Disclaimer: This note may have been dictated with voice recognition software. Similar sounding words can inadvertently be transcribed and this note may contain transcription errors which may not have been corrected upon publication of note.**  Attending  Patient was seen, examined,treatment plan was discussed with the Physician extender. I have directly reviewed the clinical findings, lab, imaging studies and management of this patient in detail. I have made the necessary changes to the above noted documentation, and agree with the documentation, as recorded by the Physician extender.  Orson Eva , DO

## 2014-05-04 NOTE — Discharge Summary (Signed)
[name] to be D/C'd Home per MD order.  Discussed with the patient and all questions fully answered.    Medication List         amiodarone 200 MG tablet  Commonly known as:  PACERONE  Take 200 mg by mouth daily.     calcium acetate 667 MG capsule  Commonly known as:  PHOSLO  Take 1,334 mg by mouth 3 (three) times daily with meals.     ciprofloxacin 500 MG tablet  Commonly known as:  CIPRO  Take 1 tablet (500 mg total) by mouth 2 (two) times daily.     glipiZIDE 2.5 MG 24 hr tablet  Commonly known as:  GLUCOTROL XL  Take 2.5 mg by mouth daily as needed. Take when sugar is 150 or greater     ipratropium 17 MCG/ACT inhaler  Commonly known as:  ATROVENT HFA  Inhale 2 puffs into the lungs every 6 (six) hours as needed for wheezing.     levalbuterol 1.25 MG/3ML nebulizer solution  Commonly known as:  XOPENEX  Take 1.25 mg by nebulization every 4 (four) hours as needed for wheezing.     metoCLOPramide 5 MG tablet  Commonly known as:  REGLAN  Take 5 mg by mouth 2 (two) times daily.     metroNIDAZOLE 500 MG tablet  Commonly known as:  FLAGYL  Take 1 tablet (500 mg total) by mouth 3 (three) times daily.     multivitamin Tabs tablet  Take 1 tablet by mouth at bedtime.     omeprazole 40 MG capsule  Commonly known as:  PRILOSEC  Take 1 capsule (40 mg total) by mouth daily.     rOPINIRole 1 MG tablet  Commonly known as:  REQUIP  Take 1 mg by mouth 2 (two) times daily.        VVS, Skin clean, dry and intact without evidence of skin break down, no evidence of skin tears noted. IV catheter discontinued intact. Site without signs and symptoms of complications. Dressing and pressure applied.  An After Visit Summary was printed and given to the patient.  D/c education completed with patient/family including follow up instructions, medication list, d/c activities limitations if indicated, with other d/c instructions as indicated by MD - patient able to verbalize  understanding, all questions fully answered.   Patient instructed to return to ED, call 911, or call MD for any changes in condition.   Patient escorted via [place], and D/C home via private auto.  Audria Nine F 05/04/2014 1:58 PM

## 2014-05-08 LAB — CULTURE, BLOOD (ROUTINE X 2)
CULTURE: NO GROWTH
Culture: NO GROWTH

## 2014-05-09 ENCOUNTER — Ambulatory Visit (INDEPENDENT_AMBULATORY_CARE_PROVIDER_SITE_OTHER): Payer: Medicare Other | Admitting: Internal Medicine

## 2014-05-09 ENCOUNTER — Encounter: Payer: Self-pay | Admitting: Internal Medicine

## 2014-05-09 VITALS — BP 108/72 | HR 74 | Temp 98.0°F | Ht 64.0 in | Wt 164.0 lb

## 2014-05-09 DIAGNOSIS — J9611 Chronic respiratory failure with hypoxia: Secondary | ICD-10-CM

## 2014-05-09 DIAGNOSIS — J9610 Chronic respiratory failure, unspecified whether with hypoxia or hypercapnia: Secondary | ICD-10-CM

## 2014-05-09 DIAGNOSIS — R0902 Hypoxemia: Secondary | ICD-10-CM

## 2014-05-09 DIAGNOSIS — J449 Chronic obstructive pulmonary disease, unspecified: Secondary | ICD-10-CM

## 2014-05-09 NOTE — Progress Notes (Signed)
Subjective:    Patient ID: [name], female    DOB: Dec 31, 1940  MRN: [id number]    Brief patient profile:  36 yowf quit smoking 2005 some doe (heavy exertion) at wt 175 and gradually downhill since despite eval by [name] and rx with inhalers and 02 x around 2009 referred to pulmonary clinic  10/11/2013 by [name] with COPD GOLD II documented    History of Present Illness  10/11/2013 1st [facility] office visit/ [name] cc indolent onset progressive doe x 10 y x one half aisle at grocery store on 02 3lpm and usually goes mb and back  s 02 and then sits down and uses combivent and feels better but never tries to rechallenge herself p combivent.  Never has resting sob.  maint on flovent/ combivent  No change in doe Sunday vs Tuesday p hd on mondays rec Stop flovent Start tudorza one twice daily and see if breathing improves and need for combivent drops Please see patient coordinator before you leave today  to schedule portable 02 per [name] 3lpm continous but no need for 02 at rest or room to room at home - always use it at bedtime though      11/10/2013 f/u ov/[name] re:  GOLD II COPD/02 dep / maint on tudorza only  Chief Complaint  Patient presents with  . Follow-up    Pt states that her breathing is much improved.  Has not needed combivent for rescue.   Wear 02 and walk ok flat and slow "anywhere she wants" now rec Ok with me to adjust your 02 for saturation above 90% Call me if not happy with tudorza and stop the combivent Only use your albuterol )(Proair = red)as a rescue medication    05/09/2014 f/u ov/[name] re: COPD II/ 02 dep Chief Complaint  Patient presents with  . Follow-up    Pt states that her breathing is unchanged since the last visit. No new co's today. She is using atrovent inhaler for rescue just about every night.   only using atrovent at hs No longer using tudorza Not needing neb saba at all, doesn't seem much better on saba hfa    No obvious day to  day or daytime variabilty or assoc chronic cough or cp or chest tightness, subjective wheeze overt sinus or hb symptoms. No unusual exp hx or h/o childhood pna/ asthma or knowledge of premature birth.  Sleeping ok without nocturnal  or early am exacerbation  of respiratory  c/o's or need for noct saba. Also denies any obvious fluctuation of symptoms with weather or environmental changes or other aggravating or alleviating factors except as outlined above   Current Medications, Allergies, Complete Past Medical History, Past Surgical History, Family History, and Social History were reviewed in [employer] record.  ROS  The following are not active complaints unless bolded sore throat, dysphagia, dental problems, itching, sneezing,  nasal congestion or excess/ purulent secretions, ear ache,   fever, chills, sweats, unintended wt loss, pleuritic or exertional cp, hemoptysis,  orthopnea pnd or leg swelling, presyncope, palpitations, heartburn, abdominal pain, anorexia, nausea, vomiting, diarrhea  or change in bowel or urinary habits, change in stools or urine, dysuria,hematuria,  rash, arthralgias, visual complaints, headache, numbness weakness or ataxia or problems with walking or coordination,  change in mood/affect or memory.      .      Objective:   Physical Exam   amb wf nad  11/10/2013  177 >  05/09/2014 164  Wt Readings from Last 3 Encounters:  10/11/13 178 lb (80.74 kg)  09/27/13 171 lb (77.565 kg)  08/31/13 172 lb 13.5 oz (78.4 kg)       HEENT mild turbinate edema.  Edentulous/ Oropharynx no thrush or excess pnd or cobblestoning.  No JVD or cervical adenopathy. Mild accessory muscle hypertrophy. Trachea midline, nl thryroid. Chest was min hyperinflated by percussion with diminished breath sounds and min increased exp time without wheeze. Hoover sign positive at very end of  inspiration. Regular rate and rhythm without murmur gallop or rub or increase P2 or  edema.  Abd: no hsm, nl excursion. Ext warm without cyanosis or clubbing.     CXR  10/11/2013 :  There is no evidence of pneumonia nor pulmonary edema. Low-grade compensated CHF superimposed upon COPD is suspected.          Assessment & Plan:

## 2014-05-09 NOTE — Patient Instructions (Addendum)
Change atrovent to 2 puffs four times a day automatically   Only use your albuterol (proair)  as a rescue medication to be used if you can't catch your breath by resting or doing a relaxed purse lip breathing pattern.  - The less you use it, the better it will work when you need it. - Ok to use up to 2 puffs  every 4 hours if you must but call for immediate appointment if use goes up over your usual need - Don't leave home without it !!  (think of it like the spare tire for your car)   Continue 02 3lpm 24/7   Please schedule a follow up visit in 3 months but call sooner if needed

## 2014-05-10 NOTE — Assessment & Plan Note (Addendum)
02 dep since arond 2009 10/11/2013   Walked RA x one lap @ 185 stopped due to  desat to 77% from baseline resting sats of 94% and ok sats on 3lpm x one lap - 05/09/2014   Walked 3lpm x one lap @ 185 stopped due to  Dizzy, no sob or desat  Adequate control on present rx, reviewed > no change in rx needed

## 2014-05-10 NOTE — Assessment & Plan Note (Addendum)
-   02 dep since around 2009  - PFT's 11/10/2013  FEV1 1.30 ( 58%) ratio 61 and 16% better p B2 ,  DLCO  68 and corrects to 82    DDX of  difficult airways management all start with A and  include Adherence, Ace Inhibitors, Acid Reflux, Active Sinus Disease, Alpha 1 Antitripsin deficiency, Anxiety masquerading as Airways dz,  ABPA,  allergy(esp in young), Aspiration (esp in elderly), Adverse effects of DPI,  Active smokers, plus two Bs  = Bronchiectasis and Beta blocker use..and one C= CHF  Adherence is always the initial "prime suspect" and is a multilayered concern that requires a "trust but verify" approach in every patient - starting with knowing how to use medications, especially inhalers, correctly, keeping up with refills and understanding the fundamental difference between maintenance and prns vs those medications only taken for a very short course and then stopped and not refilled.  - can't afford tudorza but helped -The proper method of use, as well as anticipated side effects, of a metered-dose inhaler are discussed and demonstrated to the patient. Improved effectiveness after extensive coaching during this visit to a level of approximately  75% so change atrovent hfa to 2 qid and use saba prn   ? Acid (or non-acid) GERD > always difficult to exclude as up to 75% of pts in some series report no assoc GI/ Heartburn symptoms> rec continue max (24h)  acid suppression and diet restrictions/ reviewed      See instructions for specific recommendations which were reviewed directly with the patient who was given a copy with highlighter outlining the key components.

## 2014-05-21 ENCOUNTER — Emergency Department (HOSPITAL_COMMUNITY): Payer: Medicare Other

## 2014-05-21 ENCOUNTER — Encounter (HOSPITAL_COMMUNITY): Payer: Self-pay | Admitting: Emergency Medicine

## 2014-05-21 ENCOUNTER — Inpatient Hospital Stay (HOSPITAL_COMMUNITY)
Admission: EM | Admit: 2014-05-21 | Discharge: 2014-05-26 | DRG: 371 | Disposition: A | Discharge status: Home / Self Care | Payer: Medicare Other | Attending: Internal Medicine | Admitting: Internal Medicine

## 2014-05-21 DIAGNOSIS — I739 Peripheral vascular disease, unspecified: Secondary | ICD-10-CM | POA: Diagnosis present

## 2014-05-21 DIAGNOSIS — Z885 Allergy status to narcotic agent status: Secondary | ICD-10-CM

## 2014-05-21 DIAGNOSIS — I509 Heart failure, unspecified: Secondary | ICD-10-CM | POA: Diagnosis present

## 2014-05-21 DIAGNOSIS — I120 Hypertensive chronic kidney disease with stage 5 chronic kidney disease or end stage renal disease: Secondary | ICD-10-CM | POA: Diagnosis present

## 2014-05-21 DIAGNOSIS — G8929 Other chronic pain: Secondary | ICD-10-CM | POA: Diagnosis present

## 2014-05-21 DIAGNOSIS — J189 Pneumonia, unspecified organism: Secondary | ICD-10-CM | POA: Diagnosis present

## 2014-05-21 DIAGNOSIS — J441 Chronic obstructive pulmonary disease with (acute) exacerbation: Secondary | ICD-10-CM | POA: Diagnosis present

## 2014-05-21 DIAGNOSIS — Z9981 Dependence on supplemental oxygen: Secondary | ICD-10-CM | POA: Diagnosis not present

## 2014-05-21 DIAGNOSIS — K219 Gastro-esophageal reflux disease without esophagitis: Secondary | ICD-10-CM | POA: Diagnosis present

## 2014-05-21 DIAGNOSIS — Z800 Family history of malignant neoplasm of digestive organs: Secondary | ICD-10-CM | POA: Diagnosis not present

## 2014-05-21 DIAGNOSIS — Z9861 Coronary angioplasty status: Secondary | ICD-10-CM

## 2014-05-21 DIAGNOSIS — I2510 Atherosclerotic heart disease of native coronary artery without angina pectoris: Secondary | ICD-10-CM | POA: Diagnosis present

## 2014-05-21 DIAGNOSIS — Z992 Dependence on renal dialysis: Secondary | ICD-10-CM | POA: Diagnosis not present

## 2014-05-21 DIAGNOSIS — I252 Old myocardial infarction: Secondary | ICD-10-CM

## 2014-05-21 DIAGNOSIS — N2581 Secondary hyperparathyroidism of renal origin: Secondary | ICD-10-CM | POA: Diagnosis present

## 2014-05-21 DIAGNOSIS — I4891 Unspecified atrial fibrillation: Secondary | ICD-10-CM | POA: Diagnosis present

## 2014-05-21 DIAGNOSIS — N186 End stage renal disease: Secondary | ICD-10-CM | POA: Diagnosis present

## 2014-05-21 DIAGNOSIS — Z888 Allergy status to other drugs, medicaments and biological substances status: Secondary | ICD-10-CM | POA: Diagnosis not present

## 2014-05-21 DIAGNOSIS — N039 Chronic nephritic syndrome with unspecified morphologic changes: Secondary | ICD-10-CM | POA: Diagnosis present

## 2014-05-21 DIAGNOSIS — E119 Type 2 diabetes mellitus without complications: Secondary | ICD-10-CM | POA: Diagnosis present

## 2014-05-21 DIAGNOSIS — A0472 Enterocolitis due to Clostridium difficile, not specified as recurrent: Principal | ICD-10-CM | POA: Diagnosis present

## 2014-05-21 DIAGNOSIS — M899 Disorder of bone, unspecified: Secondary | ICD-10-CM | POA: Diagnosis present

## 2014-05-21 DIAGNOSIS — E7800 Pure hypercholesterolemia, unspecified: Secondary | ICD-10-CM | POA: Diagnosis present

## 2014-05-21 DIAGNOSIS — D631 Anemia in chronic kidney disease: Secondary | ICD-10-CM | POA: Diagnosis present

## 2014-05-21 DIAGNOSIS — R0602 Shortness of breath: Secondary | ICD-10-CM | POA: Diagnosis present

## 2014-05-21 DIAGNOSIS — I2789 Other specified pulmonary heart diseases: Secondary | ICD-10-CM | POA: Diagnosis present

## 2014-05-21 DIAGNOSIS — Z833 Family history of diabetes mellitus: Secondary | ICD-10-CM | POA: Diagnosis not present

## 2014-05-21 DIAGNOSIS — Z79899 Other long term (current) drug therapy: Secondary | ICD-10-CM | POA: Diagnosis not present

## 2014-05-21 DIAGNOSIS — J181 Lobar pneumonia, unspecified organism: Secondary | ICD-10-CM

## 2014-05-21 DIAGNOSIS — R0603 Acute respiratory distress: Secondary | ICD-10-CM

## 2014-05-21 DIAGNOSIS — Z8249 Family history of ischemic heart disease and other diseases of the circulatory system: Secondary | ICD-10-CM

## 2014-05-21 DIAGNOSIS — I10 Essential (primary) hypertension: Secondary | ICD-10-CM

## 2014-05-21 DIAGNOSIS — Z85528 Personal history of other malignant neoplasm of kidney: Secondary | ICD-10-CM | POA: Diagnosis not present

## 2014-05-21 DIAGNOSIS — J9600 Acute respiratory failure, unspecified whether with hypoxia or hypercapnia: Secondary | ICD-10-CM | POA: Diagnosis present

## 2014-05-21 DIAGNOSIS — M949 Disorder of cartilage, unspecified: Secondary | ICD-10-CM | POA: Diagnosis present

## 2014-05-21 DIAGNOSIS — D72829 Elevated white blood cell count, unspecified: Secondary | ICD-10-CM

## 2014-05-21 DIAGNOSIS — J9601 Acute respiratory failure with hypoxia: Secondary | ICD-10-CM | POA: Diagnosis present

## 2014-05-21 DIAGNOSIS — Z886 Allergy status to analgesic agent status: Secondary | ICD-10-CM

## 2014-05-21 DIAGNOSIS — G4730 Sleep apnea, unspecified: Secondary | ICD-10-CM | POA: Diagnosis present

## 2014-05-21 DIAGNOSIS — Z791 Long term (current) use of non-steroidal anti-inflammatories (NSAID): Secondary | ICD-10-CM

## 2014-05-21 DIAGNOSIS — N189 Chronic kidney disease, unspecified: Secondary | ICD-10-CM

## 2014-05-21 DIAGNOSIS — R109 Unspecified abdominal pain: Secondary | ICD-10-CM | POA: Diagnosis present

## 2014-05-21 LAB — I-STAT ARTERIAL BLOOD GAS, ED
Acid-Base Excess: 3 mmol/L — ABNORMAL HIGH (ref 0.0–2.0)
Bicarbonate: 30 mEq/L — ABNORMAL HIGH (ref 20.0–24.0)
O2 Saturation: 91 %
TCO2: 32 mmol/L (ref 0–100)
pCO2 arterial: 59.7 mmHg (ref 35.0–45.0)
pH, Arterial: 7.309 — ABNORMAL LOW (ref 7.350–7.450)
pO2, Arterial: 68 mmHg — ABNORMAL LOW (ref 80.0–100.0)

## 2014-05-21 LAB — CBC
HCT: 33.1 % — ABNORMAL LOW (ref 36.0–46.0)
HEMOGLOBIN: 9.7 g/dL — AB (ref 12.0–15.0)
MCH: 28.7 pg (ref 26.0–34.0)
MCHC: 29.3 g/dL — AB (ref 30.0–36.0)
MCV: 97.9 fL (ref 78.0–100.0)
Platelets: 250 10*3/uL (ref 150–400)
RBC: 3.38 MIL/uL — ABNORMAL LOW (ref 3.87–5.11)
RDW: 16.8 % — ABNORMAL HIGH (ref 11.5–15.5)
WBC: 14.9 10*3/uL — ABNORMAL HIGH (ref 4.0–10.5)

## 2014-05-21 LAB — I-STAT CG4 LACTIC ACID, ED: Lactic Acid, Venous: 1.44 mmol/L (ref 0.5–2.2)

## 2014-05-21 LAB — COMPREHENSIVE METABOLIC PANEL
ALT: 7 U/L (ref 0–35)
ANION GAP: 18 — AB (ref 5–15)
AST: 17 U/L (ref 0–37)
Albumin: 2.6 g/dL — ABNORMAL LOW (ref 3.5–5.2)
Alkaline Phosphatase: 92 U/L (ref 39–117)
BUN: 31 mg/dL — ABNORMAL HIGH (ref 6–23)
CALCIUM: 8.6 mg/dL (ref 8.4–10.5)
CO2: 25 mEq/L (ref 19–32)
CREATININE: 6.61 mg/dL — AB (ref 0.50–1.10)
Chloride: 90 mEq/L — ABNORMAL LOW (ref 96–112)
GFR, EST AFRICAN AMERICAN: 6 mL/min — AB (ref >90)
GFR, EST NON AFRICAN AMERICAN: 6 mL/min — AB (ref >90)
GLUCOSE: 95 mg/dL (ref 70–99)
Potassium: 4.7 mEq/L (ref 3.7–5.3)
Sodium: 133 mEq/L — ABNORMAL LOW (ref 137–147)
Total Bilirubin: 0.2 mg/dL — ABNORMAL LOW (ref 0.3–1.2)
Total Protein: 6.1 g/dL (ref 6.0–8.3)

## 2014-05-21 LAB — URINALYSIS, ROUTINE W REFLEX MICROSCOPIC
Bilirubin Urine: NEGATIVE
Glucose, UA: 100 mg/dL — AB
HGB URINE DIPSTICK: NEGATIVE
Ketones, ur: NEGATIVE mg/dL
Nitrite: NEGATIVE
Specific Gravity, Urine: 1.012 (ref 1.005–1.030)
UROBILINOGEN UA: 0.2 mg/dL (ref 0.0–1.0)
pH: 7.5 (ref 5.0–8.0)

## 2014-05-21 LAB — URINE MICROSCOPIC-ADD ON

## 2014-05-21 MED ORDER — IPRATROPIUM BROMIDE 0.02 % IN SOLN
0.5000 mg | Freq: Once | RESPIRATORY_TRACT | Status: AC
  Administered 2014-05-21: 0.5 mg via RESPIRATORY_TRACT
  Filled 2014-05-21: qty 2.5

## 2014-05-21 MED ORDER — SODIUM CHLORIDE 0.9 % IV BOLUS (SEPSIS)
500.0000 mL | Freq: Once | INTRAVENOUS | Status: AC
  Administered 2014-05-21: 500 mL via INTRAVENOUS

## 2014-05-21 MED ORDER — ALBUTEROL (5 MG/ML) CONTINUOUS INHALATION SOLN
10.0000 mg/h | INHALATION_SOLUTION | Freq: Once | RESPIRATORY_TRACT | Status: AC
  Administered 2014-05-21: 10 mg/h via RESPIRATORY_TRACT
  Filled 2014-05-21: qty 20

## 2014-05-21 MED ORDER — METHYLPREDNISOLONE SODIUM SUCC 125 MG IJ SOLR
125.0000 mg | Freq: Once | INTRAMUSCULAR | Status: AC
  Administered 2014-05-21: 125 mg via INTRAVENOUS
  Filled 2014-05-21: qty 2

## 2014-05-21 MED ORDER — ONDANSETRON HCL 4 MG/2ML IJ SOLN
4.0000 mg | Freq: Once | INTRAMUSCULAR | Status: AC
  Administered 2014-05-22: 4 mg via INTRAVENOUS
  Filled 2014-05-21: qty 2

## 2014-05-21 MED ORDER — PIPERACILLIN-TAZOBACTAM 3.375 G IVPB 30 MIN
3.3750 g | Freq: Once | INTRAVENOUS | Status: AC
  Administered 2014-05-21: 3.375 g via INTRAVENOUS
  Filled 2014-05-21: qty 50

## 2014-05-21 MED ORDER — MORPHINE SULFATE 4 MG/ML IJ SOLN
4.0000 mg | Freq: Once | INTRAMUSCULAR | Status: DC
  Filled 2014-05-21: qty 1

## 2014-05-21 MED ORDER — VANCOMYCIN HCL IN DEXTROSE 1-5 GM/200ML-% IV SOLN
1000.0000 mg | Freq: Once | INTRAVENOUS | Status: AC
  Administered 2014-05-21: 1000 mg via INTRAVENOUS
  Filled 2014-05-21: qty 200

## 2014-05-21 NOTE — ED Provider Notes (Signed)
CSN: [id number]     Arrival date & time 05/21/14  2018 History   First MD Initiated Contact with Patient 05/21/14 2022     Chief Complaint  Patient presents with  . Shortness of Breath     (Consider location/radiation/quality/duration/timing/severity/associated sxs/prior Treatment) Patient is a 72 y.o. female presenting with shortness of breath. The history is provided by the patient.  Shortness of Breath Severity:  Severe Onset quality:  Gradual Timing:  Constant Progression:  Worsening Chronicity:  Chronic Context: not URI   Relieved by:  Nothing Worsened by:  Nothing tried Associated symptoms: cough   Associated symptoms: no chest pain, no fever and no vomiting     Past Medical History  Diagnosis Date  . Carotid artery occlusion     bilat CEA in 2012  . C. 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 Hypertension   . Atrial fibrillation April  2013  . Pulmonary hypertension   . Secondary hyperparathyroidism, renal   . COPD (chronic obstructive pulmonary disease)   . Hypercholesterolemia   . History of MI (myocardial infarction)     "they say I had 2 years ago" (08/20/2012)  . GERD (gastroesophageal reflux disease)   . History of pneumonia     "have it alot" (08/20/2012)  . Sleep apnea     "don't wear mask anymore"  . Type II diabetes mellitus   . History of blood transfusion 2013    "3 times so far in 2013:  4 pints one time; 2 pints another; ?# last time" (08/20/2012)  . Iron deficiency anemia   . Seizures 2012    after carotid surgery  . ESRD on dialysis     "[place]; Tues, Thurs; Sat"  . On home oxygen therapy     "24/7"   . Acute diverticulitis     Oct 2013, treated medically [facility], sigmoid diverticulitits  . CHF (congestive heart failure)   . Coronary artery disease   . Seizure disorder March 2011  . Myocardial infarction   . Renal cell carcinoma 2005?  Marland Kitchen Cancer of kidney 2005   Past Surgical History  Procedure  Laterality Date  . Nephrectomy  2005    Right, for Renal cell carcinoma  . Carotid endarterectomy  2012    bilaterally; Dr. Kellie Simmering  . Av fistula placement  Jan. 7, 2011    Right  upper arm by Dr. Kellie Simmering  . Hd catheter  Aug. 22, 2013    [facility]  . Appendectomy      childhood  . Abdominal hysterectomy  1971?  Marland Kitchen Coronary angioplasty with stent placement  1990's    "1 total"  . Coronary angioplasty  1990's  . Av fistula repair  2013    right upper arm  . Esophagogastroduodenoscopy N/A 12/29/2012    Procedure: ESOPHAGOGASTRODUODENOSCOPY (EGD);  Surgeon: Jeryl Columbia, MD;  Location: [facility] ENDOSCOPY;  Service: Endoscopy;  Laterality: N/A;  . Colonoscopy with propofol N/A 12/31/2012    Procedure: COLONOSCOPY WITH PROPOFOL;  Surgeon: Jeryl Columbia, MD;  Location: [facility] ENDOSCOPY;  Service: Endoscopy;  Laterality: N/A;  currently IP at [place] History  Problem Relation Age of Onset  . Cancer Mother     pancreatic  . Diabetes Mother   . Stroke Father   . Coronary artery disease Father   . Heart disease Father 99    Heart Disease before age 56  . Hyperlipidemia Father   . Hypertension  Father   . Heart attack Father   . Cancer Brother     Brain  . Kidney disease Brother     Kidney stones  . Emphysema Father     smoked  . Pancreatic cancer Mother    History  Substance Use Topics  . Smoking status: Former Smoker -- 1.50 packs/day for 45 years    Types: Cigarettes    Quit date: 09/30/2003  . Smokeless tobacco: Never Used  . Alcohol Use: No   OB History   Grav Para Term Preterm Abortions TAB SAB Ect Mult Living                 Review of Systems  Constitutional: Negative for fever.  Respiratory: Positive for cough and shortness of breath.   Cardiovascular: Negative for chest pain.  Gastrointestinal: Negative for vomiting.  All other systems reviewed and are negative.     Allergies  Lipitor; Morphine and related; and Nsaids  Home Medications   Prior to  Admission medications   Medication Sig Start Date End Date Taking? Authorizing Provider  amiodarone (PACERONE) 200 MG tablet Take 200 mg by mouth daily. 04/29/14  Yes Historical Provider, MD  calcium acetate (PHOSLO) 667 MG capsule Take 2,001 mg by mouth 3 (three) times daily with meals.    Yes Historical Provider, MD  dicyclomine (BENTYL) 10 MG capsule Take 10 mg by mouth daily as needed for spasms.   Yes Historical Provider, MD  ezetimibe (ZETIA) 10 MG tablet Take 10 mg by mouth at bedtime.   Yes Historical Provider, MD  fluconazole (DIFLUCAN) 100 MG tablet Take 50-100 mg by mouth daily. Takes 100mg  after dialysis Takes 50mg  on non dialysis days until completed   Yes Historical Provider, MD  glipiZIDE (GLUCOTROL XL) 10 MG 24 hr tablet Take 10 mg by mouth daily as needed (for FSBS >150).   Yes Historical Provider, MD  HYDROcodone-acetaminophen (NORCO/VICODIN) 5-325 MG per tablet Take 0.5 tablets by mouth every 6 (six) hours as needed for moderate pain.   Yes Historical Provider, MD  ipratropium (ATROVENT HFA) 17 MCG/ACT inhaler Inhale 2 puffs into the lungs every 6 (six) hours as needed for wheezing.   Yes Historical Provider, MD  metoCLOPramide (REGLAN) 5 MG tablet Take 5 mg by mouth 2 (two) times daily.   Yes Historical Provider, MD  multivitamin (RENA-VIT) TABS tablet Take 1 tablet by mouth daily with lunch.   Yes Historical Provider, MD  rOPINIRole (REQUIP) 1 MG tablet Take 1 mg by mouth 2 (two) times daily.   Yes Historical Provider, MD  metroNIDAZOLE (FLAGYL) 500 MG tablet Take 1 tablet (500 mg total) by mouth 3 (three) times daily. 05/04/14   Marianne L York, PA-C   BP 102/42  Pulse 102  Temp(Src) 98.8 F (37.1 C) (Oral)  Resp 22  SpO2 94% Physical Exam  Nursing note and vitals reviewed. Constitutional: She is oriented to person, place, and time. She appears well-developed and well-nourished. No distress.  HENT:  Head: Normocephalic and atraumatic.  Mouth/Throat: Oropharynx is clear  and moist.  Eyes: EOM are normal. Pupils are equal, round, and reactive to light.  Neck: Normal range of motion. Neck supple.  Cardiovascular: Normal rate and regular rhythm.  Exam reveals no friction rub.   No murmur heard. Pulmonary/Chest: Effort normal and breath sounds normal. No respiratory distress. She has no wheezes. She has no rales.  Abdominal: Soft. She exhibits no distension. There is no tenderness. There is no rebound.  Musculoskeletal: Normal  range of motion. She exhibits no edema.  Neurological: She is alert and oriented to person, place, and time.  Skin: No rash noted. She is not diaphoretic.    ED Course  Procedures (including critical care time) Labs Review Labs Reviewed  CBC - Abnormal; Notable for the following:    WBC 14.9 (*)    RBC 3.38 (*)    Hemoglobin 9.7 (*)    HCT 33.1 (*)    MCHC 29.3 (*)    RDW 16.8 (*)    All other components within normal limits  COMPREHENSIVE METABOLIC PANEL - Abnormal; Notable for the following:    Sodium 133 (*)    Chloride 90 (*)    BUN 31 (*)    Creatinine, Ser 6.61 (*)    Albumin 2.6 (*)    Total Bilirubin 0.2 (*)    GFR calc non Af Amer 6 (*)    GFR calc Af Amer 6 (*)    Anion gap 18 (*)    All other components within normal limits  URINALYSIS, ROUTINE W REFLEX MICROSCOPIC - Abnormal; Notable for the following:    Color, Urine AMBER (*)    APPearance CLOUDY (*)    Glucose, UA 100 (*)    Protein, ur >300 (*)    Leukocytes, UA TRACE (*)    All other components within normal limits  URINE MICROSCOPIC-ADD ON - Abnormal; Notable for the following:    Squamous Epithelial / LPF MANY (*)    All other components within normal limits  I-STAT ARTERIAL BLOOD GAS, ED - Abnormal; Notable for the following:    pH, Arterial 7.309 (*)    pCO2 arterial 59.7 (*)    pO2, Arterial 68.0 (*)    Bicarbonate 30.0 (*)    Acid-Base Excess 3.0 (*)    All other components within normal limits  CULTURE, BLOOD (ROUTINE X 2)  CULTURE, BLOOD  (ROUTINE X 2)  BLOOD GAS, ARTERIAL  I-STAT CG4 LACTIC ACID, ED    Imaging Review Dg Chest Port 1 View  05/21/2014   CLINICAL DATA:  Difficulty breathing and cough  EXAM: PORTABLE CHEST - 1 VIEW  COMPARISON:  March 01, 2014  FINDINGS: There is cardiomegaly with pulmonary venous hypertension. There is generalized interstitial edema. There is a small area of infiltrate in the lateral right base. No adenopathy. There is atherosclerotic change in aorta.  IMPRESSION: Findings felt to represent chronic congestive heart failure. Superimposed infiltrate lateral right base.   Electronically Signed   By: Lowella Grip M.D.   On: 05/21/2014 21:25     EKG Interpretation None     Angiocath insertion Performed by: Osvaldo Shipper  Consent: Verbal consent obtained. Risks and benefits: risks, benefits and alternatives were discussed Time out: Immediately prior to procedure a "time out" was called to verify the correct patient, procedure, equipment, support staff and site/side marked as required.  Preparation: Patient was prepped and draped in the usual sterile fashion.  Vein Location: L AC  Yes Ultrasound Guided  Gauge: 18  Normal blood return and flush without difficulty Patient tolerance: Patient tolerated the procedure well with no immediate complications.     MDM   Final diagnoses:  Healthcare-associated pneumonia  Acute respiratory distress  Chronic obstructive pulmonary disease with acute exacerbation    73 year old female with history of COPD, CHF presents with acute shortness of breath. Worsening over the past 2 days, but once worsening tonight. No chest pain. No fevers and having chills. She's on 4  L at baseline for chronic COPD here, as her oxygen for tachypnea. Is satting okay on her normal 4 L but had increased work of breathing. Put on 15 L nonrebreather with some relief. Diffusely wheezing with rhonchi. Concern for COPD exacerbation. Given steroids and albuterol. Will  obtain ABG, other labs, chest x-ray appeared Chest x-ray shows pneumonia. Given pain consistent for age As she is a dialysis patient. Admitted to step down. She is feeling better and that her oxygen weaned down back to her baseline 4 L.    Evelina Bucy, MD 05/22/14 [phone]

## 2014-05-21 NOTE — Progress Notes (Signed)
ABG    Component Value Date/Time   PHART 7.309* 05/21/2014 2151   PCO2ART 59.7* 05/21/2014 2151   PO2ART 68.0* 05/21/2014 2151   HCO3 30.0* 05/21/2014 2151   TCO2 32 05/21/2014 2151   ACIDBASEDEF 0.8 02/26/2014 1118   O2SAT 91.0 05/21/2014 2151   Pt on 3lpm 02 (home regimen).

## 2014-05-21 NOTE — ED Notes (Addendum)
Presents with SOB, increased WOB and productive cough. Breath sounds diminished bilaterally. Dialysis PT days MWF. SOB began last week and progressed. Pt able to speak in short phrases.  Hot to touch, fatigue, endorses nausea and vomiting and lack of appetite

## 2014-05-22 ENCOUNTER — Encounter (HOSPITAL_COMMUNITY): Payer: Self-pay | Admitting: Internal Medicine

## 2014-05-22 ENCOUNTER — Inpatient Hospital Stay (HOSPITAL_COMMUNITY): Payer: Medicare Other

## 2014-05-22 DIAGNOSIS — R109 Unspecified abdominal pain: Secondary | ICD-10-CM

## 2014-05-22 DIAGNOSIS — J9601 Acute respiratory failure with hypoxia: Secondary | ICD-10-CM | POA: Diagnosis present

## 2014-05-22 DIAGNOSIS — J189 Pneumonia, unspecified organism: Secondary | ICD-10-CM

## 2014-05-22 DIAGNOSIS — J9600 Acute respiratory failure, unspecified whether with hypoxia or hypercapnia: Secondary | ICD-10-CM

## 2014-05-22 DIAGNOSIS — Z992 Dependence on renal dialysis: Secondary | ICD-10-CM

## 2014-05-22 DIAGNOSIS — N186 End stage renal disease: Secondary | ICD-10-CM

## 2014-05-22 LAB — GLUCOSE, CAPILLARY
GLUCOSE-CAPILLARY: 137 mg/dL — AB (ref 70–99)
Glucose-Capillary: 194 mg/dL — ABNORMAL HIGH (ref 70–99)
Glucose-Capillary: 188 mg/dL — ABNORMAL HIGH (ref 70–99)

## 2014-05-22 LAB — CBC WITH DIFFERENTIAL/PLATELET
Basophils Absolute: 0 10*3/uL (ref 0.0–0.1)
Basophils Relative: 0 % (ref 0–1)
EOS ABS: 0 10*3/uL (ref 0.0–0.7)
Eosinophils Relative: 0 % (ref 0–5)
HEMATOCRIT: 31.5 % — AB (ref 36.0–46.0)
HEMOGLOBIN: 9.3 g/dL — AB (ref 12.0–15.0)
Lymphocytes Relative: 3 % — ABNORMAL LOW (ref 12–46)
Lymphs Abs: 0.4 10*3/uL — ABNORMAL LOW (ref 0.7–4.0)
MCH: 28.7 pg (ref 26.0–34.0)
MCHC: 29.5 g/dL — ABNORMAL LOW (ref 30.0–36.0)
MCV: 97.2 fL (ref 78.0–100.0)
MONO ABS: 0.1 10*3/uL (ref 0.1–1.0)
MONOS PCT: 1 % — AB (ref 3–12)
Neutro Abs: 14.1 10*3/uL — ABNORMAL HIGH (ref 1.7–7.7)
Neutrophils Relative %: 96 % — ABNORMAL HIGH (ref 43–77)
Platelets: 263 10*3/uL (ref 150–400)
RBC: 3.24 MIL/uL — AB (ref 3.87–5.11)
RDW: 16.8 % — ABNORMAL HIGH (ref 11.5–15.5)
WBC: 14.6 10*3/uL — ABNORMAL HIGH (ref 4.0–10.5)

## 2014-05-22 LAB — COMPREHENSIVE METABOLIC PANEL
ALBUMIN: 2.4 g/dL — AB (ref 3.5–5.2)
ALK PHOS: 98 U/L (ref 39–117)
ALT: 7 U/L (ref 0–35)
AST: 15 U/L (ref 0–37)
Anion gap: 16 — ABNORMAL HIGH (ref 5–15)
BUN: 35 mg/dL — ABNORMAL HIGH (ref 6–23)
CALCIUM: 8.6 mg/dL (ref 8.4–10.5)
CO2: 26 mEq/L (ref 19–32)
Chloride: 90 mEq/L — ABNORMAL LOW (ref 96–112)
Creatinine, Ser: 7.09 mg/dL — ABNORMAL HIGH (ref 0.50–1.10)
GFR calc non Af Amer: 5 mL/min — ABNORMAL LOW (ref >90)
GFR, EST AFRICAN AMERICAN: 6 mL/min — AB (ref >90)
GLUCOSE: 237 mg/dL — AB (ref 70–99)
POTASSIUM: 4.8 meq/L (ref 3.7–5.3)
Sodium: 132 mEq/L — ABNORMAL LOW (ref 137–147)
TOTAL PROTEIN: 5.9 g/dL — AB (ref 6.0–8.3)
Total Bilirubin: 0.3 mg/dL (ref 0.3–1.2)

## 2014-05-22 LAB — TROPONIN I

## 2014-05-22 LAB — LIPASE, BLOOD: Lipase: 12 U/L (ref 11–59)

## 2014-05-22 LAB — CLOSTRIDIUM DIFFICILE BY PCR: CDIFFPCR: POSITIVE — AB

## 2014-05-22 LAB — CBG MONITORING, ED: Glucose-Capillary: 264 mg/dL — ABNORMAL HIGH (ref 70–99)

## 2014-05-22 MED ORDER — ROPINIROLE HCL 1 MG PO TABS
1.0000 mg | ORAL_TABLET | Freq: Two times a day (BID) | ORAL | Status: DC
  Administered 2014-05-22 – 2014-05-26 (×10): 1 mg via ORAL
  Filled 2014-05-22 (×13): qty 1

## 2014-05-22 MED ORDER — DOCUSATE SODIUM 100 MG PO CAPS
100.0000 mg | ORAL_CAPSULE | Freq: Two times a day (BID) | ORAL | Status: DC
  Administered 2014-05-22: 100 mg via ORAL
  Filled 2014-05-22 (×11): qty 1

## 2014-05-22 MED ORDER — PENTAFLUOROPROP-TETRAFLUOROETH EX AERO: 1.0000 "application " | INHALATION_SPRAY | CUTANEOUS | Status: DC | PRN

## 2014-05-22 MED ORDER — SODIUM CHLORIDE 0.9 % IJ SOLN
3.0000 mL | Freq: Two times a day (BID) | INTRAMUSCULAR | Status: DC
  Administered 2014-05-22 – 2014-05-24 (×4): 3 mL via INTRAVENOUS

## 2014-05-22 MED ORDER — SODIUM CHLORIDE 0.9 % IV SOLN: 100.0000 mL | INTRAVENOUS | Status: DC | PRN

## 2014-05-22 MED ORDER — FLUCONAZOLE 100 MG PO TABS
100.0000 mg | ORAL_TABLET | Freq: Once | ORAL | Status: AC
  Administered 2014-05-22: 100 mg via ORAL
  Filled 2014-05-22: qty 1

## 2014-05-22 MED ORDER — HYDROCODONE-ACETAMINOPHEN 5-325 MG PO TABS
0.5000 | ORAL_TABLET | Freq: Four times a day (QID) | ORAL | Status: DC | PRN
  Administered 2014-05-23 – 2014-05-24 (×2): 0.5 via ORAL
  Filled 2014-05-22 (×2): qty 1

## 2014-05-22 MED ORDER — ALTEPLASE 2 MG IJ SOLR: 2.0000 mg | Freq: Once | INTRAMUSCULAR | Status: DC | PRN

## 2014-05-22 MED ORDER — IOHEXOL 300 MG/ML  SOLN
25.0000 mL | INTRAMUSCULAR | Status: AC
  Administered 2014-05-22 (×2): 25 mL via ORAL

## 2014-05-22 MED ORDER — MUPIROCIN 2 % EX OINT
1.0000 "application " | TOPICAL_OINTMENT | Freq: Two times a day (BID) | CUTANEOUS | Status: DC
  Administered 2014-05-22 – 2014-05-26 (×9): 1 via NASAL
  Filled 2014-05-22: qty 22

## 2014-05-22 MED ORDER — VANCOMYCIN 50 MG/ML ORAL SOLUTION
125.0000 mg | Freq: Four times a day (QID) | ORAL | Status: DC
Start: 2014-05-22 — End: 2014-05-26
  Administered 2014-05-22 – 2014-05-26 (×14): 125 mg via ORAL
  Filled 2014-05-22 (×17): qty 2.5

## 2014-05-22 MED ORDER — AMIODARONE HCL 200 MG PO TABS
200.0000 mg | ORAL_TABLET | Freq: Every day | ORAL | Status: DC
  Administered 2014-05-22 – 2014-05-26 (×5): 200 mg via ORAL
  Filled 2014-05-22 (×5): qty 1

## 2014-05-22 MED ORDER — FLUCONAZOLE 50 MG PO TABS
50.0000 mg | ORAL_TABLET | Freq: Once | ORAL | Status: AC
  Administered 2014-05-23: 50 mg via ORAL
  Filled 2014-05-22: qty 1

## 2014-05-22 MED ORDER — ALTEPLASE 2 MG IJ SOLR
2.0000 mg | Freq: Once | INTRAMUSCULAR | Status: DC | PRN
  Filled 2014-05-22: qty 2

## 2014-05-22 MED ORDER — ONDANSETRON HCL 4 MG/2ML IJ SOLN: 4.0000 mg | Freq: Four times a day (QID) | INTRAMUSCULAR | Status: DC | PRN

## 2014-05-22 MED ORDER — LIDOCAINE-PRILOCAINE 2.5-2.5 % EX CREA
1.0000 "application " | TOPICAL_CREAM | CUTANEOUS | Status: DC | PRN
  Filled 2014-05-22: qty 5

## 2014-05-22 MED ORDER — SODIUM CHLORIDE 0.9 % IJ SOLN
3.0000 mL | Freq: Two times a day (BID) | INTRAMUSCULAR | Status: DC
  Administered 2014-05-22 – 2014-05-25 (×9): 3 mL via INTRAVENOUS

## 2014-05-22 MED ORDER — ENOXAPARIN SODIUM 30 MG/0.3ML ~~LOC~~ SOLN
30.0000 mg | SUBCUTANEOUS | Status: DC
  Administered 2014-05-22 – 2014-05-23 (×2): 30 mg via SUBCUTANEOUS
  Filled 2014-05-22 (×3): qty 0.3

## 2014-05-22 MED ORDER — LIDOCAINE HCL (PF) 1 % IJ SOLN: 5.0000 mL | INTRAMUSCULAR | Status: DC | PRN

## 2014-05-22 MED ORDER — LIDOCAINE-PRILOCAINE 2.5-2.5 % EX CREA: 1.0000 "application " | TOPICAL_CREAM | CUTANEOUS | Status: DC | PRN

## 2014-05-22 MED ORDER — CALCIUM ACETATE 667 MG PO CAPS
2001.0000 mg | ORAL_CAPSULE | Freq: Three times a day (TID) | ORAL | Status: DC
  Administered 2014-05-22 – 2014-05-26 (×10): 2001 mg via ORAL
  Filled 2014-05-22 (×17): qty 3

## 2014-05-22 MED ORDER — HYDROCODONE-ACETAMINOPHEN 5-325 MG PO TABS
1.0000 | ORAL_TABLET | Freq: Once | ORAL | Status: AC
  Administered 2014-05-22: 1 via ORAL
  Filled 2014-05-22: qty 1

## 2014-05-22 MED ORDER — HEPARIN SODIUM (PORCINE) 1000 UNIT/ML DIALYSIS: 1000.0000 [IU] | INTRAMUSCULAR | Status: DC | PRN

## 2014-05-22 MED ORDER — ACETAMINOPHEN 325 MG PO TABS: 650.0000 mg | ORAL_TABLET | Freq: Four times a day (QID) | ORAL | Status: DC | PRN

## 2014-05-22 MED ORDER — DICYCLOMINE HCL 10 MG PO CAPS
10.0000 mg | ORAL_CAPSULE | Freq: Every day | ORAL | Status: DC | PRN
  Filled 2014-05-22: qty 1

## 2014-05-22 MED ORDER — ACETAMINOPHEN 650 MG RE SUPP: 650.0000 mg | Freq: Four times a day (QID) | RECTAL | Status: DC | PRN

## 2014-05-22 MED ORDER — METOCLOPRAMIDE HCL 5 MG PO TABS
5.0000 mg | ORAL_TABLET | Freq: Two times a day (BID) | ORAL | Status: DC
  Administered 2014-05-22 – 2014-05-26 (×8): 5 mg via ORAL
  Filled 2014-05-22 (×11): qty 1

## 2014-05-22 MED ORDER — HEPARIN SODIUM (PORCINE) 1000 UNIT/ML DIALYSIS
1000.0000 [IU] | INTRAMUSCULAR | Status: DC | PRN
Start: 2014-05-22 — End: 2014-05-22

## 2014-05-22 MED ORDER — DEXTROSE 5 % IV SOLN
2.0000 g | INTRAVENOUS | Status: DC
  Administered 2014-05-22: 2 g via INTRAVENOUS
  Filled 2014-05-22 (×2): qty 2

## 2014-05-22 MED ORDER — NEPRO/CARBSTEADY PO LIQD: 237.0000 mL | ORAL | Status: DC | PRN

## 2014-05-22 MED ORDER — GLIPIZIDE ER 10 MG PO TB24
10.0000 mg | ORAL_TABLET | Freq: Every day | ORAL | Status: DC | PRN
  Filled 2014-05-22: qty 1

## 2014-05-22 MED ORDER — DARBEPOETIN ALFA-POLYSORBATE 100 MCG/0.5ML IJ SOLN
100.0000 ug | INTRAMUSCULAR | Status: DC
  Administered 2014-05-24: 100 ug via INTRAVENOUS
  Filled 2014-05-22: qty 0.5

## 2014-05-22 MED ORDER — CHLORHEXIDINE GLUCONATE CLOTH 2 % EX PADS
6.0000 | MEDICATED_PAD | Freq: Every day | CUTANEOUS | Status: DC
  Administered 2014-05-22 – 2014-05-26 (×4): 6 via TOPICAL

## 2014-05-22 MED ORDER — NEPRO/CARBSTEADY PO LIQD
237.0000 mL | ORAL | Status: DC
  Administered 2014-05-23 – 2014-05-26 (×3): 237 mL via ORAL
  Filled 2014-05-22 (×5): qty 237

## 2014-05-22 MED ORDER — NEPRO/CARBSTEADY PO LIQD
237.0000 mL | ORAL | Status: DC | PRN
  Filled 2014-05-22: qty 237

## 2014-05-22 MED ORDER — RENA-VITE PO TABS
1.0000 | ORAL_TABLET | Freq: Every day | ORAL | Status: DC
  Administered 2014-05-22 – 2014-05-26 (×5): 1 via ORAL
  Filled 2014-05-22 (×5): qty 1

## 2014-05-22 MED ORDER — VANCOMYCIN HCL 500 MG IV SOLR
500.0000 mg | Freq: Once | INTRAVENOUS | Status: AC
  Administered 2014-05-22: 500 mg via INTRAVENOUS
  Filled 2014-05-22: qty 500

## 2014-05-22 MED ORDER — IOHEXOL 300 MG/ML  SOLN
100.0000 mL | Freq: Once | INTRAMUSCULAR | Status: AC | PRN
  Administered 2014-05-22: 100 mL via INTRAVENOUS

## 2014-05-22 MED ORDER — EZETIMIBE 10 MG PO TABS
10.0000 mg | ORAL_TABLET | Freq: Every day | ORAL | Status: DC
  Administered 2014-05-22 – 2014-05-25 (×5): 10 mg via ORAL
  Filled 2014-05-22 (×7): qty 1

## 2014-05-22 MED ORDER — CETYLPYRIDINIUM CHLORIDE 0.05 % MT LIQD
7.0000 mL | Freq: Two times a day (BID) | OROMUCOSAL | Status: DC
  Administered 2014-05-22 – 2014-05-26 (×9): 7 mL via OROMUCOSAL

## 2014-05-22 MED ORDER — SODIUM CHLORIDE 0.9 % IV SOLN
100.0000 mL | INTRAVENOUS | Status: DC | PRN
Start: 2014-05-22 — End: 2014-05-22

## 2014-05-22 MED ORDER — FLUCONAZOLE 100 MG PO TABS: 50.0000 mg | ORAL_TABLET | Freq: Every day | ORAL | Status: DC

## 2014-05-22 MED ORDER — CEFEPIME HCL 2 G IJ SOLR
2.0000 g | Freq: Once | INTRAMUSCULAR | Status: AC
  Administered 2014-05-22: 2 g via INTRAVENOUS
  Filled 2014-05-22: qty 2

## 2014-05-22 MED ORDER — SACCHAROMYCES BOULARDII 250 MG PO CAPS
250.0000 mg | ORAL_CAPSULE | Freq: Two times a day (BID) | ORAL | Status: DC
  Administered 2014-05-22 – 2014-05-26 (×8): 250 mg via ORAL
  Filled 2014-05-22 (×10): qty 1

## 2014-05-22 MED ORDER — ONDANSETRON HCL 4 MG PO TABS: 4.0000 mg | ORAL_TABLET | Freq: Four times a day (QID) | ORAL | Status: DC | PRN

## 2014-05-22 MED ORDER — VANCOMYCIN HCL IN DEXTROSE 750-5 MG/150ML-% IV SOLN
750.0000 mg | INTRAVENOUS | Status: DC
  Administered 2014-05-22 – 2014-05-24 (×2): 750 mg via INTRAVENOUS
  Filled 2014-05-22 (×4): qty 150

## 2014-05-22 MED ORDER — BOOST / RESOURCE BREEZE PO LIQD
1.0000 | ORAL | Status: DC
  Administered 2014-05-23 – 2014-05-26 (×3): 1 via ORAL

## 2014-05-22 MED ORDER — INSULIN ASPART 100 UNIT/ML ~~LOC~~ SOLN
0.0000 [IU] | Freq: Three times a day (TID) | SUBCUTANEOUS | Status: DC
Start: 2014-05-22 — End: 2014-05-26
  Administered 2014-05-22 (×2): 2 [IU] via SUBCUTANEOUS
  Administered 2014-05-24 – 2014-05-25 (×2): 1 [IU] via SUBCUTANEOUS
  Administered 2014-05-25: 2 [IU] via SUBCUTANEOUS

## 2014-05-22 NOTE — Progress Notes (Signed)
ANTIBIOTIC CONSULT NOTE - INITIAL  Pharmacy Consult for vancomycin PO Indication: Cdiff  Allergies  Allergen Reactions  . Lipitor [Atorvastatin] Other (See Comments)    Causes weakness and drowsiness  . Morphine And Related Nausea And Vomiting  . Nsaids Other (See Comments)    Unknown reported by previous hospital    Patient Measurements: Height: 5\' 4"  (162.6 cm) Weight: 169 lb 12.1 oz (77 kg) IBW/kg (Calculated) : 54.7  Vital Signs: Temp: 97.8 F (36.6 C) (08/24 2141) Temp src: Oral (08/24 2141) BP: 128/68 mmHg (08/24 2141) Pulse Rate: 80 (08/24 2141) Intake/Output from previous day: 08/23 0701 - 08/24 0700 In: -  Out: 100 [Urine:100] Intake/Output from this shift:    Labs:  Recent Labs  05/21/14 2056 05/22/14 0453  WBC 14.9* 14.6*  HGB 9.7* 9.3*  PLT 250 263  CREATININE 6.61* 7.09*   Estimated Creatinine Clearance: 7.2 ml/min (by C-G formula based on Cr of 7.09). No results found for this basename: VANCOTROUGH, Corlis Leak, VANCORANDOM, [place], GENTPEAK, GENTRANDOM, TOBRATROUGH, TOBRAPEAK, TOBRARND, AMIKACINPEAK, AMIKACINTROU, AMIKACIN,  in the last 72 hours   Microbiology: Recent Results (from the past 720 hour(s))  MRSA PCR SCREENING     Status: Abnormal   Collection Time    05/01/14 11:03 PM      Result Value Ref Range Status   MRSA by PCR POSITIVE (*) NEGATIVE Final   Comment:            The GeneXpert MRSA Assay (FDA     approved for NASAL specimens     only), is one component of a     comprehensive MRSA colonization     surveillance program. It is not     intended to diagnose MRSA     infection nor to guide or     monitor treatment for     MRSA infections.     RESULT CALLED TO, READ BACK BY AND VERIFIED WITH:     A.MUHAMMED RN [id number] 05/02/14 E.GADDY  CULTURE, BLOOD (ROUTINE X 2)     Status: None   Collection Time    05/02/14  2:20 AM      Result Value Ref Range Status   Specimen Description BLOOD LEFT ANTECUBITAL   Final   Special Requests  BOTTLES DRAWN AEROBIC AND ANAEROBIC 5CC   Final   Culture  Setup Time     Final   Value: 05/02/2014 08:26     Performed at [facility]   Culture     Final   Value: NO GROWTH 5 DAYS     Performed at [facility]   Report Status 05/08/2014 FINAL   Final  CULTURE, BLOOD (ROUTINE X 2)     Status: None   Collection Time    05/02/14  2:30 AM      Result Value Ref Range Status   Specimen Description BLOOD LEFT HAND   Final   Special Requests BOTTLES DRAWN AEROBIC AND ANAEROBIC 5CC   Final   Culture  Setup Time     Final   Value: 05/02/2014 08:26     Performed at [facility]   Culture     Final   Value: NO GROWTH 5 DAYS     Performed at [facility]   Report Status 05/08/2014 FINAL   Final  CLOSTRIDIUM DIFFICILE BY PCR     Status: None   Collection Time    05/03/14 10:05 PM      Result Value Ref Range Status  C difficile by pcr NEGATIVE  NEGATIVE Final  CLOSTRIDIUM DIFFICILE BY PCR     Status: Abnormal   Collection Time    05/22/14  3:33 PM      Result Value Ref Range Status   C difficile by pcr POSITIVE (*) NEGATIVE Final   Comment: CRITICAL RESULT CALLED TO, READ BACK BY AND VERIFIED WITH:     N.OMVEHMCNO,BS AT 1815 BY L.PITT 05/22/14   Medications:  Anti-infectives   Start     Dose/Rate Route Frequency Ordered Stop   05/23/14 1000  fluconazole (DIFLUCAN) tablet 50 mg     50 mg Oral  Once 05/22/14 0328     05/22/14 2300  vancomycin (VANCOCIN) 50 mg/mL oral solution 125 mg     125 mg Oral 4 times daily 05/22/14 2223 06/05/14 2159   05/22/14 1800  ceFEPIme (MAXIPIME) 2 g in dextrose 5 % 50 mL IVPB     2 g 100 mL/hr over 30 Minutes Intravenous Every M-W-F (1800) 05/22/14 0213     05/22/14 1200  vancomycin (VANCOCIN) IVPB 750 mg/150 ml premix     750 mg 150 mL/hr over 60 Minutes Intravenous Every M-W-F (Hemodialysis) 05/22/14 0213     05/22/14 1000  fluconazole (DIFLUCAN) tablet 50-100 mg  Status:  Discontinued     50-100 mg Oral Daily 05/22/14  0140 05/22/14 0327   05/22/14 1000  fluconazole (DIFLUCAN) tablet 100 mg     100 mg Oral  Once 05/22/14 0328 05/22/14 1557   05/22/14 0600  ceFEPIme (MAXIPIME) 2 g in dextrose 5 % 50 mL IVPB     2 g 100 mL/hr over 30 Minutes Intravenous  Once 05/22/14 0144 05/22/14 0701   05/22/14 0145  vancomycin (VANCOCIN) 500 mg in sodium chloride 0.9 % 100 mL IVPB     500 mg 100 mL/hr over 60 Minutes Intravenous  Once 05/22/14 0144 05/22/14 0500   05/21/14 2145  vancomycin (VANCOCIN) IVPB 1000 mg/200 mL premix     1,000 mg 200 mL/hr over 60 Minutes Intravenous  Once 05/21/14 2142 05/22/14 0020   05/21/14 2145  piperacillin-tazobactam (ZOSYN) IVPB 3.375 g     3.375 g 100 mL/hr over 30 Minutes Intravenous  Once 05/21/14 2142 05/21/14 2222     Assessment: 69 yof with a history of Cdiff to start PO vancomycin for a new positive CDiff PCR. Pt has been on PO vanc in the past. Afebrile, WBC 14.6.  Goal of Therapy:  Resolution of Cdiff  Plan:  1. Vancomycin 125mg  PO QID - low threshold to increase if no response 2. May need to consider vanc taper, adding rifaximin due to recurrent nature of cdiff - defer to MD 3. F/u clinical status  Caroleen Stoermer, Rande Lawman 05/22/2014,10:24 PM

## 2014-05-22 NOTE — Progress Notes (Signed)
The patient arrived to 3E23 from the ED at 0200.  She was oriented to the unit and placed on telemetry.  She is A&Ox4 and having dyspnea on exertion.  She is on 3L of O2 (home dependent) and her O2 sat level is in the mid-90s.  Rhonchi can be heard throughout all lung bases.  She is on contact precautions for both MRSA and r/o C. diff.  The call bell was placed within reach and the bed alarm was turned on.

## 2014-05-22 NOTE — Progress Notes (Signed)
Patient admitted after midnight by Dr. Raliegh Ip. Please see H&P.   A/P: Acute respiratory failure - on 4 L O2 at home -combination of mild fluid overload with pneumonia/COPD.  -empiric antibiotics for pneumonia. For HD today   COPD -  -Continue nebulizer and Pulmicort.   Abdominal pain with recent admission for diverticulitis - c/o pain in rectum not abd -CT abdomen and pelvis  -stool studies- patient reports more constipation than diarrhea to me  -chronic pain medications.  Lipase ok   ESRD on hemodialysis on Monday Wednesday and Friday - -left message for dialysis.   Chronic anemia probably from ESRD - follow CBC  Patient is asking to go home as her granddaughter is [place] the [place] this [place]  Eulogio Bear DO

## 2014-05-22 NOTE — Progress Notes (Signed)
INITIAL NUTRITION ASSESSMENT  DOCUMENTATION CODES Per approved criteria  -Not Applicable   INTERVENTION: Provide Resource Breeze once daily, provides 250 kcal and 9 grams of protein Provide Nepro Shake once daily, provides 425 kcal and 20 grams of protein Encourage PO intake as tolerated  NUTRITION DIAGNOSIS: Inadequate oral intake related to poor appetite and nausea as evidenced by pt's report and meal completion <50%.   Goal: Pt to meet >/= 90% of their estimated nutrition needs   Monitor:  PO intake, weight trend, labs  Reason for Assessment: Malnutrition Screening Tool (MST), score of 3  73 y.o. female  Admitting Dx: Acute respiratory failure with hypoxia  ASSESSMENT: 73 y.o. female who was recently admitted to the hospital for diverticulitis presents with complaints of shortness of breath patient states that she has been having productive cough for last one week. Patient has been short of breath even at rest. Denies having missed any dialysis. Denies any chest pain fever chills. Patient also states that she has been having abdominal pain with increasing nausea vomiting with loose stools over the last 10 days. In the ER chest x-ray showed features concerning for fluid overload and possible pneumonia. Patient was initially on 100% nonrebreather and quickly improved with nebulizer and patient was started on empiric antibiotics for pneumonia.   Pt was seen by RD during previous admission on 05/02/14 at which time pt reported weight loss and eating poorly due to being in and out of the hospital; she reported weighing 194 lbs one year ago. Pt reports her appetite has been poor for the past few weeks since previous admission. She denies any additional weight loss. She ate 25% of her breakfast and states this is about the amount she eats at every meal. She has been using protein bars at home to get extra protein and calories; unsure of name. She received Resource breeze during previous  admission which she liked and she is agreeable to trying Nepro Shakes this admission.   Nutrition Focused Physical Exam:  Subcutaneous Fat:  Orbital Region: wnl Upper Arm Region: mild wasting Thoracic and Lumbar Region: NA  Muscle:  Temple Region: wnl Clavicle Bone Region: wnl Clavicle and Acromion Bone Region: wnl Scapular Bone Region: NA Dorsal Hand: wnl Patellar Region: mild wasting Anterior Thigh Region: mild wasting Posterior Calf Region: mild wasting  Edema: none noted  Height: Ht Readings from Last 1 Encounters:  05/22/14 5\' 4"  (1.626 m)    Weight: Wt Readings from Last 1 Encounters:  05/22/14 169 lb 12.1 oz (77 kg)    Ideal Body Weight: 120 lbs  % Ideal Body Weight: 141%  Wt Readings from Last 10 Encounters:  05/22/14 169 lb 12.1 oz (77 kg)  05/09/14 164 lb (74.39 kg)  05/04/14 163 lb 9.3 oz (74.2 kg)  04/19/14 165 lb 9.1 oz (75.101 kg)  02/28/14 170 lb 13.7 oz (77.5 kg)  11/10/13 177 lb (80.287 kg)  10/11/13 178 lb (80.74 kg)  09/27/13 171 lb (77.565 kg)  08/31/13 172 lb 13.5 oz (78.4 kg)  06/27/13 166 lb 7.2 oz (75.5 kg)    Usual Body Weight: 166 lbs (Septemebr 2014)  % Usual Body Weight: 102%  BMI:  Body mass index is 29.12 kg/(m^2).  Estimated Nutritional Needs: Kcal: 1800-2000 Protein: 80-90 grams Fluid: 1.2 L per MD  Skin: intact  Diet Order: Diabetic/Renal ;25% meal completion per nursing notes  EDUCATION NEEDS: -No education needs identified at this time   Intake/Output Summary (Last 24 hours) at 05/22/14 1557 Last  data filed at 05/22/14 1411  Gross per 24 hour  Intake    120 ml  Output   3000 ml  Net  -2880 ml    Last BM: 8/23  Labs:   Recent Labs Lab 05/21/14 2056 05/22/14 0453  NA 133* 132*  K 4.7 4.8  CL 90* 90*  CO2 25 26  BUN 31* 35*  CREATININE 6.61* 7.09*  CALCIUM 8.6 8.6  GLUCOSE 95 237*    CBG (last 3)   Recent Labs  05/22/14 0213 05/22/14 0659  GLUCAP 264* 194*    Scheduled Meds: .  amiodarone  200 mg Oral Daily  . antiseptic oral rinse  7 mL Mouth Rinse BID  . calcium acetate  2,001 mg Oral TID WC  . ceFEPime (MAXIPIME) IV  2 g Intravenous Q M,W,F-1800  . Chlorhexidine Gluconate Cloth  6 each Topical Q0600  . [START ON 05/24/2014] darbepoetin (ARANESP) injection - DIALYSIS  100 mcg Intravenous Q Wed-HD  . docusate sodium  100 mg Oral BID  . enoxaparin (LOVENOX) injection  30 mg Subcutaneous Q24H  . ezetimibe  10 mg Oral QHS  . fluconazole  100 mg Oral Once  . [START ON 05/23/2014] fluconazole  50 mg Oral Once  . insulin aspart  0-9 Units Subcutaneous TID WC  . metoCLOPramide  5 mg Oral BID  . multivitamin  1 tablet Oral Q lunch  . mupirocin ointment  1 application Nasal BID  . rOPINIRole  1 mg Oral BID  . sodium chloride  3 mL Intravenous Q12H  . sodium chloride  3 mL Intravenous Q12H  . vancomycin  750 mg Intravenous Q M,W,F-HD    Continuous Infusions:   Past Medical History  Diagnosis Date  . Carotid artery occlusion     bilat CEA in 2012  . C. 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 Hypertension   . Atrial fibrillation April  2013  . Pulmonary hypertension   . Secondary hyperparathyroidism, renal   . COPD (chronic obstructive pulmonary disease)   . Hypercholesterolemia   . History of MI (myocardial infarction)     "they say I had 2 years ago" (08/20/2012)  . GERD (gastroesophageal reflux disease)   . History of pneumonia     "have it alot" (08/20/2012)  . Sleep apnea     "don't wear mask anymore"  . Type II diabetes mellitus   . History of blood transfusion 2013    "3 times so far in 2013:  4 pints one time; 2 pints another; ?# last time" (08/20/2012)  . Iron deficiency anemia   . Seizures 2012    after carotid surgery  . ESRD on dialysis     "[place]; Tues, Thurs; Sat"  . On home oxygen therapy     "24/7"   . Acute diverticulitis     Oct 2013, treated medically [facility], sigmoid diverticulitits  . CHF  (congestive heart failure)   . Coronary artery disease   . Seizure disorder March 2011  . Myocardial infarction   . Renal cell carcinoma 2005?  Marland Kitchen Cancer of kidney 2005    Past Surgical History  Procedure Laterality Date  . Nephrectomy  2005    Right, for Renal cell carcinoma  . Carotid endarterectomy  2012    bilaterally; Dr. Kellie Simmering  . Av fistula placement  Jan. 7, 2011    Right  upper arm by Dr. Kellie Simmering  . Hd catheter  Aug. 22, 2013  [facility]  . Appendectomy      childhood  . Abdominal hysterectomy  1971?  Marland Kitchen Coronary angioplasty with stent placement  1990's    "1 total"  . Coronary angioplasty  1990's  . Av fistula repair  2013    right upper arm  . Esophagogastroduodenoscopy N/A 12/29/2012    Procedure: ESOPHAGOGASTRODUODENOSCOPY (EGD);  Surgeon: Jeryl Columbia, MD;  Location: [facility] ENDOSCOPY;  Service: Endoscopy;  Laterality: N/A;  . Colonoscopy with propofol N/A 12/31/2012    Procedure: COLONOSCOPY WITH PROPOFOL;  Surgeon: Jeryl Columbia, MD;  Location: [facility] ENDOSCOPY;  Service: Endoscopy;  Laterality: N/A;  currently IP at [place], LDN Inpatient Clinical Dietitian Pager: [phone] After Hours Pager: [phone]

## 2014-05-22 NOTE — Progress Notes (Signed)
Difficulty during cannulation of venous site today in HD.  Had to re-stick three times due to clots.  PA aware and will contact IR for evaluation of access.

## 2014-05-22 NOTE — Consult Note (Signed)
[facility] Renal Consultation Note  Indication for Consultation:  Management of ESRD/hemodialysis; anemia, hypertension/volume and secondary hyperparathyroidism  HPI: [name] is a 73 y.o. female with a history of hypertension, atrial fibrillation, COPD on home oxygen, and ESRD on dialysis at the [facility] who presented to the ED last night with worsening dyspnea and productive cough for one week.  She denies any fever or chills, but was hospitalized 7/22-23 for COPD exacerbation and again 8/3-6 for abdominal pain related to diverticulitis.  She continues to have occasional post-prandial nausea and vomiting and occasional loose stools.  Chest x-ray suggests interstitial edema and possible infiltrate at the lateral right base.  She has missed no dialysis and usually reaches her UF goal.  CT of the abdomen showed multiple low-density subscapular foci with the spleen, suggesting small volume splenic infarcts vs interval development of lymphangiomas, but no other explanation for abdominal pain.  Dialysis Orders:  MWF @ [facility] 4 hrs     74 kg      40/A1.5     2K/2.25Ca      Profile 2     AVF @ [facility]      No Heparin  Calcitriol 0.25 mcg per HD       Aranesp 100 mcg on Wed        Nop Venofer  Past Medical History  Diagnosis Date  . Carotid artery occlusion     bilat CEA in 2012  . C. 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 Hypertension   . Atrial fibrillation April  2013  . Pulmonary hypertension   . Secondary hyperparathyroidism, renal   . COPD (chronic obstructive pulmonary disease)   . Hypercholesterolemia   . History of MI (myocardial infarction)     "they say I had 2 years ago" (08/20/2012)  . GERD (gastroesophageal reflux disease)   . History of pneumonia     "have it alot" (08/20/2012)  . Sleep apnea     "don't wear mask anymore"  . Type II diabetes mellitus   . History of blood transfusion 2013    "3 times so far in 2013:  4 pints one  time; 2 pints another; ?# last time" (08/20/2012)  . Iron deficiency anemia   . Seizures 2012    after carotid surgery  . ESRD on dialysis     "[place]; Tues, Thurs; Sat"  . On home oxygen therapy     "24/7"   . Acute diverticulitis     Oct 2013, treated medically [facility], sigmoid diverticulitits  . CHF (congestive heart failure)   . Coronary artery disease   . Seizure disorder March 2011  . Myocardial infarction   . Renal cell carcinoma 2005?  Marland Kitchen Cancer of kidney 2005   Past Surgical History  Procedure Laterality Date  . Nephrectomy  2005    Right, for Renal cell carcinoma  . Carotid endarterectomy  2012    bilaterally; Dr. Kellie Simmering  . Av fistula placement  Jan. 7, 2011    Right  upper arm by Dr. Kellie Simmering  . Hd catheter  Aug. 22, 2013    [facility]  . Appendectomy      childhood  . Abdominal hysterectomy  1971?  Marland Kitchen Coronary angioplasty with stent placement  1990's    "1 total"  . Coronary angioplasty  1990's  . Av fistula repair  2013    right upper arm  . Esophagogastroduodenoscopy N/A 12/29/2012  Procedure: ESOPHAGOGASTRODUODENOSCOPY (EGD);  Surgeon: Jeryl Columbia, MD;  Location: [facility] ENDOSCOPY;  Service: Endoscopy;  Laterality: N/A;  . Colonoscopy with propofol N/A 12/31/2012    Procedure: COLONOSCOPY WITH PROPOFOL;  Surgeon: Jeryl Columbia, MD;  Location: [facility] ENDOSCOPY;  Service: Endoscopy;  Laterality: N/A;  currently IP at [place] History  Problem Relation Age of Onset  . Cancer Mother     pancreatic  . Diabetes Mother   . Stroke Father   . Coronary artery disease Father   . Heart disease Father 64    Heart Disease before age 97  . Hyperlipidemia Father   . Hypertension Father   . Heart attack Father   . Cancer Brother     Brain  . Kidney disease Brother     Kidney stones  . Emphysema Father     smoked  . Pancreatic cancer Mother    Social History  She quit smoking about 10 years ago after using 1 1/2 packs a day during a 67.5 pack-year  smoking history. She denies any history of alcohol or illicit drug use.  She previously drove a dump truck and has lived with her daughter since her husband died.  Allergies  Allergen Reactions  . Lipitor [Atorvastatin] Other (See Comments)    Causes weakness and drowsiness  . Morphine And Related Nausea And Vomiting  . Nsaids Other (See Comments)    Unknown reported by previous hospital   Prior to Admission medications   Medication Sig Start Date End Date Taking? Authorizing Provider  amiodarone (PACERONE) 200 MG tablet Take 200 mg by mouth daily. 04/29/14  Yes Historical Provider, MD  calcium acetate (PHOSLO) 667 MG capsule Take 2,001 mg by mouth 3 (three) times daily with meals.    Yes Historical Provider, MD  dicyclomine (BENTYL) 10 MG capsule Take 10 mg by mouth daily as needed for spasms.   Yes Historical Provider, MD  ezetimibe (ZETIA) 10 MG tablet Take 10 mg by mouth at bedtime.   Yes Historical Provider, MD  fluconazole (DIFLUCAN) 100 MG tablet Take 50-100 mg by mouth daily. Takes 114m after dialysis Takes 572mon non dialysis days until completed   Yes Historical Provider, MD  glipiZIDE (GLUCOTROL XL) 10 MG 24 hr tablet Take 10 mg by mouth daily as needed (for FSBS >150).   Yes Historical Provider, MD  HYDROcodone-acetaminophen (NORCO/VICODIN) 5-325 MG per tablet Take 0.5 tablets by mouth every 6 (six) hours as needed for moderate pain.   Yes Historical Provider, MD  ipratropium (ATROVENT HFA) 17 MCG/ACT inhaler Inhale 2 puffs into the lungs every 6 (six) hours as needed for wheezing.   Yes Historical Provider, MD  metoCLOPramide (REGLAN) 5 MG tablet Take 5 mg by mouth 2 (two) times daily.   Yes Historical Provider, MD  multivitamin (RENA-VIT) TABS tablet Take 1 tablet by mouth daily with lunch.   Yes Historical Provider, MD  rOPINIRole (REQUIP) 1 MG tablet Take 1 mg by mouth 2 (two) times daily.   Yes Historical Provider, MD  metroNIDAZOLE (FLAGYL) 500 MG tablet Take 1 tablet (500  mg total) by mouth 3 (three) times daily. 05/04/14   MaMelton AlarPA-C   Labs:  Results for orders placed during the hospital encounter of 05/21/14 (from the past 48 hour(s))  CBC     Status: Abnormal   Collection Time    05/21/14  8:56 PM      Result Value Ref Range   WBC 14.9 (*)  4.0 - 10.5 K/uL   RBC 3.38 (*) 3.87 - 5.11 MIL/uL   Hemoglobin 9.7 (*) 12.0 - 15.0 g/dL   HCT 33.1 (*) 36.0 - 46.0 %   MCV 97.9  78.0 - 100.0 fL   MCH 28.7  26.0 - 34.0 pg   MCHC 29.3 (*) 30.0 - 36.0 g/dL   RDW 16.8 (*) 11.5 - 15.5 %   Platelets 250  150 - 400 K/uL  COMPREHENSIVE METABOLIC PANEL     Status: Abnormal   Collection Time    05/21/14  8:56 PM      Result Value Ref Range   Sodium 133 (*) 137 - 147 mEq/L   Potassium 4.7  3.7 - 5.3 mEq/L   Chloride 90 (*) 96 - 112 mEq/L   CO2 25  19 - 32 mEq/L   Glucose, Bld 95  70 - 99 mg/dL   BUN 31 (*) 6 - 23 mg/dL   Creatinine, Ser 6.61 (*) 0.50 - 1.10 mg/dL   Calcium 8.6  8.4 - 10.5 mg/dL   Total Protein 6.1  6.0 - 8.3 g/dL   Albumin 2.6 (*) 3.5 - 5.2 g/dL   AST 17  0 - 37 U/L   ALT 7  0 - 35 U/L   Alkaline Phosphatase 92  39 - 117 U/L   Total Bilirubin 0.2 (*) 0.3 - 1.2 mg/dL   GFR calc non Af Amer 6 (*) >90 mL/min   GFR calc Af Amer 6 (*) >90 mL/min   Comment: (NOTE)     The eGFR has been calculated using the CKD EPI equation.     This calculation has not been validated in all clinical situations.     eGFR's persistently <90 mL/min signify possible Chronic Kidney     Disease.   Anion gap 18 (*) 5 - 15  I-STAT CG4 LACTIC ACID, ED     Status: None   Collection Time    05/21/14  9:14 PM      Result Value Ref Range   Lactic Acid, Venous 1.44  0.5 - 2.2 mmol/L  I-STAT ARTERIAL BLOOD GAS, ED     Status: Abnormal   Collection Time    05/21/14  9:51 PM      Result Value Ref Range   pH, Arterial 7.309 (*) 7.350 - 7.450   pCO2 arterial 59.7 (*) 35.0 - 45.0 mmHg   pO2, Arterial 68.0 (*) 80.0 - 100.0 mmHg   Bicarbonate 30.0 (*) 20.0 - 24.0 mEq/L    TCO2 32  0 - 100 mmol/L   O2 Saturation 91.0     Acid-Base Excess 3.0 (*) 0.0 - 2.0 mmol/L   Patient temperature 98.6 F     Collection site RADIAL, ALLEN'S TEST ACCEPTABLE     Drawn by RT     Sample type ARTERIAL     Comment NOTIFIED PHYSICIAN    URINALYSIS, ROUTINE W REFLEX MICROSCOPIC     Status: Abnormal   Collection Time    05/21/14 10:02 PM      Result Value Ref Range   Color, Urine AMBER (*) YELLOW   Comment: BIOCHEMICALS MAY BE AFFECTED BY COLOR   APPearance CLOUDY (*) CLEAR   Specific Gravity, Urine 1.012  1.005 - 1.030   pH 7.5  5.0 - 8.0   Glucose, UA 100 (*) NEGATIVE mg/dL   Hgb urine dipstick NEGATIVE  NEGATIVE   Bilirubin Urine NEGATIVE  NEGATIVE   Ketones, ur NEGATIVE  NEGATIVE mg/dL   Protein,  ur >300 (*) NEGATIVE mg/dL   Urobilinogen, UA 0.2  0.0 - 1.0 mg/dL   Nitrite NEGATIVE  NEGATIVE   Leukocytes, UA TRACE (*) NEGATIVE  URINE MICROSCOPIC-ADD ON     Status: Abnormal   Collection Time    05/21/14 10:02 PM      Result Value Ref Range   Squamous Epithelial / LPF MANY (*) RARE   WBC, UA 0-2  <3 WBC/hpf   Bacteria, UA RARE  RARE  CBG MONITORING, ED     Status: Abnormal   Collection Time    05/22/14  2:13 AM      Result Value Ref Range   Glucose-Capillary 264 (*) 70 - 99 mg/dL  COMPREHENSIVE METABOLIC PANEL     Status: Abnormal   Collection Time    05/22/14  4:53 AM      Result Value Ref Range   Sodium 132 (*) 137 - 147 mEq/L   Potassium 4.8  3.7 - 5.3 mEq/L   Chloride 90 (*) 96 - 112 mEq/L   CO2 26  19 - 32 mEq/L   Glucose, Bld 237 (*) 70 - 99 mg/dL   BUN 35 (*) 6 - 23 mg/dL   Creatinine, Ser 7.09 (*) 0.50 - 1.10 mg/dL   Calcium 8.6  8.4 - 10.5 mg/dL   Total Protein 5.9 (*) 6.0 - 8.3 g/dL   Albumin 2.4 (*) 3.5 - 5.2 g/dL   AST 15  0 - 37 U/L   ALT 7  0 - 35 U/L   Alkaline Phosphatase 98  39 - 117 U/L   Total Bilirubin 0.3  0.3 - 1.2 mg/dL   GFR calc non Af Amer 5 (*) >90 mL/min   GFR calc Af Amer 6 (*) >90 mL/min   Comment: (NOTE)     The eGFR  has been calculated using the CKD EPI equation.     This calculation has not been validated in all clinical situations.     eGFR's persistently <90 mL/min signify possible Chronic Kidney     Disease.   Anion gap 16 (*) 5 - 15  CBC WITH DIFFERENTIAL     Status: Abnormal   Collection Time    05/22/14  4:53 AM      Result Value Ref Range   WBC 14.6 (*) 4.0 - 10.5 K/uL   RBC 3.24 (*) 3.87 - 5.11 MIL/uL   Hemoglobin 9.3 (*) 12.0 - 15.0 g/dL   HCT 31.5 (*) 36.0 - 46.0 %   MCV 97.2  78.0 - 100.0 fL   MCH 28.7  26.0 - 34.0 pg   MCHC 29.5 (*) 30.0 - 36.0 g/dL   RDW 16.8 (*) 11.5 - 15.5 %   Platelets 263  150 - 400 K/uL   Neutrophils Relative % 96 (*) 43 - 77 %   Neutro Abs 14.1 (*) 1.7 - 7.7 K/uL   Lymphocytes Relative 3 (*) 12 - 46 %   Lymphs Abs 0.4 (*) 0.7 - 4.0 K/uL   Monocytes Relative 1 (*) 3 - 12 %   Monocytes Absolute 0.1  0.1 - 1.0 K/uL   Eosinophils Relative 0  0 - 5 %   Eosinophils Absolute 0.0  0.0 - 0.7 K/uL   Basophils Relative 0  0 - 1 %   Basophils Absolute 0.0  0.0 - 0.1 K/uL  LIPASE, BLOOD     Status: None   Collection Time    05/22/14  4:53 AM      Result Value  Ref Range   Lipase 12  11 - 59 U/L  TROPONIN I     Status: None   Collection Time    05/22/14  4:53 AM      Result Value Ref Range   Troponin I <0.30  <0.30 ng/mL   Comment:            Due to the release kinetics of cTnI,     a negative result within the first hours     of the onset of symptoms does not rule out     myocardial infarction with certainty.     If myocardial infarction is still suspected,     repeat the test at appropriate intervals.  GLUCOSE, CAPILLARY     Status: Abnormal   Collection Time    05/22/14  6:59 AM      Result Value Ref Range   Glucose-Capillary 194 (*) 70 - 99 mg/dL   Constitutional: negative for chills, fatigue, fevers and sweats Ears, nose, mouth, throat, and face: negative for earaches, hoarseness, nasal congestion and sore throat Respiratory: positive for cough,  dyspnea on exertion and sputum, negative for hemoptysis Cardiovascular: positive for dyspnea, negative for chest pain, orthopnea and palpitations Gastrointestinal: positive for ocasional loose stools, negative for abdominal pain, nausea and vomiting Genitourinary:negative, anuric Musculoskeletal:negative for arthralgias, back pain, myalgias and neck pain Neurological: negative for dizziness, gait problems, headaches, paresthesia and speech problems  Physical Exam: Filed Vitals:   05/22/14 1227  BP: 172/71  Pulse: 85  Temp:   Resp: 25     General appearance: alert, cooperative and no distress Head: Normocephalic, without obvious abnormality, atraumatic Neck: no adenopathy, no carotid bruit, no JVD and supple, symmetrical, trachea midline Resp: clear to auscultation bilaterally Cardio: regular rate and rhythm, S1, S2 normal, no murmur, click, rub or gallop GI: soft, non-tender; bowel sounds normal; no masses,  no organomegaly Extremities: extremities normal, atraumatic, no cyanosis or edema Neurologic: Grossly normal Dialysis Access: AVF @ [facility] with + bruit   Assessment/Plan: 1. Acute respiratory failure - likely fluid overloaded with COPD and possibly PNA, on Vancomycin & Cefepime, HD pending for fluid removal.   2. ESRD - HD on MWF @ [facility], K 4.8.  HD today. 3. Hypertension/volume - BP 172/71, no meds; reaching EDW during recent HD, 3.7 L over EDW today.  UF goal of 4 L as tolerated today. 4. Anemia - Hgb 9.3 on Aranesp 100 mcg on Wed. 5. Metabolic bone disease - Ca 8.6 (9.9 corrected); Calcitriol 0.25 mcg, Phoslo 3 with meals. 6. Nutrition - Alb 2.4, renal carb-mod diet, multivitamin. 7. A-fib - on Amiodarone, BB. 8. COPD - on neb & home O2. 9. Hx PVD - s/p B carotid endarterectomy 10. DM - per primary. 11. Hx Diverticulitis - @ [facility] 8/3-6, treated w/ IV Zosyn, then PO Cipro & Flagyl.  LYLES,CHARLES 05/22/2014, 12:46 PM   Attending Nephrologist:  Roney Jaffe, MD  Pt seen,  examined and agree w A/P as above. ESRD patient with SOB and pulm edema on CXR and CT.  Plan for HD today with maximum UF.  Will follow.  Kelly Splinter MD pager [phone]    cell [phone] 05/22/2014, 1:59 PM

## 2014-05-22 NOTE — Progress Notes (Signed)
ANTIBIOTIC CONSULT NOTE - INITIAL  Pharmacy Consult for Vancomycin/Cefepime  Indication: pneumonia  Allergies  Allergen Reactions  . Lipitor [Atorvastatin] Other (See Comments)    Causes weakness and drowsiness  . Morphine And Related Nausea And Vomiting  . Nsaids Other (See Comments)    Unknown reported by previous hospital    Patient Measurements: 74.4 kg  Vital Signs: Temp: 98.8 F (37.1 C) (08/23 2026) Temp src: Oral (08/23 2026) BP: 103/36 mmHg (08/24 0100) Pulse Rate: 87 (08/24 0100)  Labs:  Recent Labs  05/21/14 2056  WBC 14.9*  HGB 9.7*  PLT 250  CREATININE 6.61*   Assessment: [name] with ESRD on HD MWF here with shortness of breath to start broad spectrum anti-biotics. Leukocytosis present, other labs as above.   Goal of Therapy:  Pre-HD vancomycin level 15-25 mg/L  Plan:  -Vancomycin 1500 mg IV x 1 LOAD, then 750 mg IV qHD MWF -Cefepime 2g IV x 1, then 2g IV q1800 on HD days  -Trend WBC, temp, renal function  -Drug levels as indicated   Narda Bonds 05/22/2014,1:43 AM

## 2014-05-22 NOTE — Procedures (Signed)
I was present at this dialysis session, have reviewed the session itself and made  appropriate changes  Kelly Splinter MD (pgr) [phone]    [phone] 05/22/2014, 2:41 PM

## 2014-05-22 NOTE — H&P (Signed)
[facility] History and Physical  [name] GHW:[id number] DOB: [birth year] DOA: 05/21/2014  Referring physician: ER physician. PCP: Gilford Rile, MD   Chief Complaint: Shortness of breath.  HPI: [name] is a 73 y.o. female who was recently admitted to the hospital for diverticulitis presents with complaints of shortness of breath patient states that she has been having productive cough for last one week. Patient has been short of breath even at rest. Denies having missed any dialysis. Denies any chest pain fever chills. Patient also states that she has been having abdominal pain with increasing nausea vomiting with loose stools over the last 10 days. In the ER chest x-ray showed features concerning for fluid overload and possible pneumonia. Patient was initially on 100% nonrebreather and quickly improved with nebulizer and patient was started on empiric antibiotics for pneumonia. At this time on my exam patient is not in distress and is able to complete sentences without any difficulty.   Review of Systems: As presented in the history of presenting illness, rest negative.  Past Medical History  Diagnosis Date  . Carotid artery occlusion     bilat CEA in 2012  . C. 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 Hypertension   . Atrial fibrillation April  2013  . Pulmonary hypertension   . Secondary hyperparathyroidism, renal   . COPD (chronic obstructive pulmonary disease)   . Hypercholesterolemia   . History of MI (myocardial infarction)     "they say I had 2 years ago" (08/20/2012)  . GERD (gastroesophageal reflux disease)   . History of pneumonia     "have it alot" (08/20/2012)  . Sleep apnea     "don't wear mask anymore"  . Type II diabetes mellitus   . History of blood transfusion 2013    "3 times so far in 2013:  4 pints one time; 2 pints another; ?# last time" (08/20/2012)  . Iron deficiency anemia   . Seizures 2012    after carotid surgery  .  ESRD on dialysis     "[place]; Tues, Thurs; Sat"  . On home oxygen therapy     "24/7"   . Acute diverticulitis     Oct 2013, treated medically [facility], sigmoid diverticulitits  . CHF (congestive heart failure)   . Coronary artery disease   . Seizure disorder March 2011  . Myocardial infarction   . Renal cell carcinoma 2005?  Marland Kitchen Cancer of kidney 2005   Past Surgical History  Procedure Laterality Date  . Nephrectomy  2005    Right, for Renal cell carcinoma  . Carotid endarterectomy  2012    bilaterally; Dr. Kellie Simmering  . Av fistula placement  Jan. 7, 2011    Right  upper arm by Dr. Kellie Simmering  . Hd catheter  Aug. 22, 2013    [facility]  . Appendectomy      childhood  . Abdominal hysterectomy  1971?  Marland Kitchen Coronary angioplasty with stent placement  1990's    "1 total"  . Coronary angioplasty  1990's  . Av fistula repair  2013    right upper arm  . Esophagogastroduodenoscopy N/A 12/29/2012    Procedure: ESOPHAGOGASTRODUODENOSCOPY (EGD);  Surgeon: Jeryl Columbia, MD;  Location: [facility] ENDOSCOPY;  Service: Endoscopy;  Laterality: N/A;  . Colonoscopy with propofol N/A 12/31/2012    Procedure: COLONOSCOPY WITH PROPOFOL;  Surgeon: Jeryl Columbia, MD;  Location: [facility] ENDOSCOPY;  Service: Endoscopy;  Laterality: N/A;  currently  IP at [place]   Social History:  reports that she quit smoking about 10 years ago. Her smoking use included Cigarettes. She has a 67.5 pack-year smoking history. She has never used smokeless tobacco. She reports that she does not drink alcohol or use illicit drugs. Where does patient live home. Can patient participate in ADLs? Yes.  Allergies  Allergen Reactions  . Lipitor [Atorvastatin] Other (See Comments)    Causes weakness and drowsiness  . Morphine And Related Nausea And Vomiting  . Nsaids Other (See Comments)    Unknown reported by previous hospital    Family History:  Family History  Problem Relation Age of Onset  . Cancer Mother     pancreatic  .  Diabetes Mother   . Stroke Father   . Coronary artery disease Father   . Heart disease Father 42    Heart Disease before age 78  . Hyperlipidemia Father   . Hypertension Father   . Heart attack Father   . Cancer Brother     Brain  . Kidney disease Brother     Kidney stones  . Emphysema Father     smoked  . Pancreatic cancer Mother       Prior to Admission medications   Medication Sig Start Date End Date Taking? Authorizing Provider  amiodarone (PACERONE) 200 MG tablet Take 200 mg by mouth daily. 04/29/14  Yes Historical Provider, MD  calcium acetate (PHOSLO) 667 MG capsule Take 2,001 mg by mouth 3 (three) times daily with meals.    Yes Historical Provider, MD  dicyclomine (BENTYL) 10 MG capsule Take 10 mg by mouth daily as needed for spasms.   Yes Historical Provider, MD  ezetimibe (ZETIA) 10 MG tablet Take 10 mg by mouth at bedtime.   Yes Historical Provider, MD  fluconazole (DIFLUCAN) 100 MG tablet Take 50-100 mg by mouth daily. Takes 100mg  after dialysis Takes 50mg  on non dialysis days until completed   Yes Historical Provider, MD  glipiZIDE (GLUCOTROL XL) 10 MG 24 hr tablet Take 10 mg by mouth daily as needed (for FSBS >150).   Yes Historical Provider, MD  HYDROcodone-acetaminophen (NORCO/VICODIN) 5-325 MG per tablet Take 0.5 tablets by mouth every 6 (six) hours as needed for moderate pain.   Yes Historical Provider, MD  ipratropium (ATROVENT HFA) 17 MCG/ACT inhaler Inhale 2 puffs into the lungs every 6 (six) hours as needed for wheezing.   Yes Historical Provider, MD  metoCLOPramide (REGLAN) 5 MG tablet Take 5 mg by mouth 2 (two) times daily.   Yes Historical Provider, MD  multivitamin (RENA-VIT) TABS tablet Take 1 tablet by mouth daily with lunch.   Yes Historical Provider, MD  rOPINIRole (REQUIP) 1 MG tablet Take 1 mg by mouth 2 (two) times daily.   Yes Historical Provider, MD  metroNIDAZOLE (FLAGYL) 500 MG tablet Take 1 tablet (500 mg total) by mouth 3 (three) times daily. 05/04/14    Melton Alar, PA-C    Physical Exam: Filed Vitals:   05/21/14 2245 05/21/14 2330 05/21/14 2345 05/22/14 0000  BP: 102/42 92/52 95/51  103/42  Pulse: 102 68 96 94  Temp:      TempSrc:      Resp: 22 25 24 24   SpO2: 94% 92% 92% 90%     General:  Well-developed well-nourished.  Eyes: Anicteric no pallor.  ENT: No discharge from ears eyes nose mouth.  Neck: No mass felt.  Cardiovascular: S1-S2 heard.  Respiratory: No rhonchi or crepitations.  Abdomen: Soft  nontender bowel sounds present. No guarding or rigidity.  Skin: No rash.  Musculoskeletal: No edema.  Psychiatric: Appears normal.  Neurologic: Alert awake oriented to time place and person. Moves all extremities.  Labs on Admission:  Basic Metabolic Panel:  Recent Labs Lab 05/21/14 2056  NA 133*  K 4.7  CL 90*  CO2 25  GLUCOSE 95  BUN 31*  CREATININE 6.61*  CALCIUM 8.6   Liver Function Tests:  Recent Labs Lab 05/21/14 2056  AST 17  ALT 7  ALKPHOS 92  BILITOT 0.2*  PROT 6.1  ALBUMIN 2.6*   No results found for this basename: LIPASE, AMYLASE,  in the last 168 hours No results found for this basename: AMMONIA,  in the last 168 hours CBC:  Recent Labs Lab 05/21/14 2056  WBC 14.9*  HGB 9.7*  HCT 33.1*  MCV 97.9  PLT 250   Cardiac Enzymes: No results found for this basename: CKTOTAL, CKMB, CKMBINDEX, TROPONINI,  in the last 168 hours  BNP (last 3 results)  Recent Labs  08/27/13 2220 04/19/14 0410  PROBNP 14565.0* 30133.0*   CBG: No results found for this basename: GLUCAP,  in the last 168 hours  Radiological Exams on Admission: Dg Chest Port 1 View  05/21/2014   CLINICAL DATA:  Difficulty breathing and cough  EXAM: PORTABLE CHEST - 1 VIEW  COMPARISON:  March 01, 2014  FINDINGS: There is cardiomegaly with pulmonary venous hypertension. There is generalized interstitial edema. There is a small area of infiltrate in the lateral right base. No adenopathy. There is atherosclerotic  change in aorta.  IMPRESSION: Findings felt to represent chronic congestive heart failure. Superimposed infiltrate lateral right base.   Electronically Signed   By: Lowella Grip M.D.   On: 05/21/2014 21:25     Assessment/Plan Principal Problem:   Acute respiratory failure with hypoxia Active Problems:   Pneumonia   ESRD on dialysis   Abdominal pain   Acute respiratory failure   1. Acute respiratory failure - probably a combination of mild fluid overload with pneumonia and COPD. Patient presently is not in acute distress and is able to complete sentences without difficulty. Patient will continue on empiric antibiotics for pneumonia. Have left message for dialysis. 2. COPD - patient was initially mildly wheezing in the ER and on my exam patient is wheezing and is largely resolved. Continue nebulizer and Pulmicort. 3. Abdominal pain with recent admission for diverticulitis - abdomen appears benign on exam but patient states she has not been able to eat well over the last few days with persistent nausea and diarrhea. Check CT abdomen and pelvis and stool studies. Patient is on chronic pain medications. Check lipase. 4. ESRD on hemodialysis on Monday Wednesday and Friday - have left message for dialysis. 5. Chronic anemia probably from ESRD - follow CBC.    Code Status: Full code.  Family Communication: Patient's daughter at the bedside.  Disposition Plan: Admit to inpatient.    KAKRAKANDY,ARSHAD N. [facility] Pager [phone].  If 7PM-7AM, please contact night-coverage [email or web address] Password [facility] 05/22/2014, 12:45 AM

## 2014-05-23 ENCOUNTER — Inpatient Hospital Stay (HOSPITAL_COMMUNITY): Payer: Medicare Other

## 2014-05-23 DIAGNOSIS — A0472 Enterocolitis due to Clostridium difficile, not specified as recurrent: Principal | ICD-10-CM

## 2014-05-23 DIAGNOSIS — D72829 Elevated white blood cell count, unspecified: Secondary | ICD-10-CM

## 2014-05-23 DIAGNOSIS — I10 Essential (primary) hypertension: Secondary | ICD-10-CM

## 2014-05-23 LAB — GLUCOSE, CAPILLARY
GLUCOSE-CAPILLARY: 120 mg/dL — AB (ref 70–99)
Glucose-Capillary: 105 mg/dL — ABNORMAL HIGH (ref 70–99)
Glucose-Capillary: 127 mg/dL — ABNORMAL HIGH (ref 70–99)

## 2014-05-23 MED ORDER — IOHEXOL 300 MG/ML  SOLN
100.0000 mL | Freq: Once | INTRAMUSCULAR | Status: AC | PRN
  Administered 2014-05-23: 1 mL via INTRAVENOUS

## 2014-05-23 MED ORDER — ALBUTEROL SULFATE (2.5 MG/3ML) 0.083% IN NEBU
2.5000 mg | INHALATION_SOLUTION | RESPIRATORY_TRACT | Status: DC | PRN
  Administered 2014-05-23 – 2014-05-24 (×5): 2.5 mg via RESPIRATORY_TRACT
  Filled 2014-05-23 (×5): qty 3

## 2014-05-23 NOTE — Progress Notes (Signed)
Subjective:  Still with cough, but breathing better since dialysis yesterday, frequent diarrhea, no nausea or vomiting  Objective: Vital signs in last 24 hours: Temp:  [97.6 F (36.4 C)-98 F (36.7 C)] 97.6 F (36.4 C) (08/25 0537) Pulse Rate:  [77-85] 82 (08/25 0537) Resp:  [16-28] 20 (08/25 0537) BP: (118-172)/(45-71) 130/71 mmHg (08/25 0537) SpO2:  [90 %-98 %] 98 % (08/25 0537) Weight:  [76.915 kg (169 lb 9.1 oz)-77.7 kg (171 lb 4.8 oz)] 76.915 kg (169 lb 9.1 oz) (08/25 0537) Weight change: 3.129 kg (6 lb 14.4 oz)  Intake/Output from previous day: 08/24 0701 - 08/25 0700 In: 350 [P.O.:300; IV Piggyback:50] Out: 2900 [Urine:150] Intake/Output this shift: Total I/O In: 360 [P.O.:360] Out: -   Lab Results:  Recent Labs  05/21/14 2056 05/22/14 0453  WBC 14.9* 14.6*  HGB 9.7* 9.3*  HCT 33.1* 31.5*  PLT 250 263   BMET:  Recent Labs  05/21/14 2056 05/22/14 0453  NA 133* 132*  K 4.7 4.8  CL 90* 90*  CO2 25 26  GLUCOSE 95 237*  BUN 31* 35*  CREATININE 6.61* 7.09*  CALCIUM 8.6 8.6  ALBUMIN 2.6* 2.4*   No results found for this basename: PTH,  in the last 72 hours Iron Studies: No results found for this basename: IRON, TIBC, TRANSFERRIN, FERRITIN,  in the last 72 hours  Studies/Results: Ct Abdomen W Contrast  05/22/2014   CLINICAL DATA:  Abdominal pain. Right nephrectomy for renal cell carcinoma 10 years ago. Appendectomy.  EXAM: CT ABDOMEN WITH CONTRAST  TECHNIQUE: Multidetector CT imaging of the abdomen was performed using the standard protocol following bolus administration of intravenous contrast.  CONTRAST:  166mL OMNIPAQUE IOHEXOL 300 MG/ML  SOLN  COMPARISON:  05/01/2014  FINDINGS: Lower Chest: Bibasilar atelectasis. Mild cardiomegaly with small bilateral pleural effusions.  Abdomen: Normal liver. Splenic size upper normal with subcentimeter nonspecific peripheral hypo attenuating foci within. These are of indeterminate acuity, but appear new since the most recent  contrast-enhanced study of 07/27/2012.  Normal stomach, pancreas. Gallbladder borderline distended, without pericholecystic edema or biliary ductal dilatation.  Normal right adrenal gland. Similar left adrenal thickening/nodularity.  Moderate left renal atrophy. Minimally complex lower pole left renal cyst (calcification dependently). Other left renal lesions which are consistent with cysts. Right nephrectomy, without locally recurrent disease.  Advanced aortic and branch vessel atherosclerosis. No retroperitoneal or retrocrural adenopathy.  Extensive colonic diverticulosis. Normal terminal ileum. Normal abdominal small bowel without ascites.  Bones/Musculoskeletal:  No acute osseous abnormality.  IMPRESSION: 1. Multiple low-density subcapsular foci within the spleen. These are indeterminate, but appear new since 07/27/2012. Differential considerations include small volume splenic infarcts versus interval development of lymphangiomas. 2. No other explanation for abdominal pain. 3. Borderline gallbladder distention, without specific evidence of acute cholecystitis. 4. Right nephrectomy without locally recurrent or metastatic disease. 5. Small bilateral pleural effusions. 6. Advanced atherosclerosis.   Electronically Signed   By: Abigail Miyamoto M.D.   On: 05/22/2014 08:17   EXAM: General appearance:  Alert, in no apparent distress Resp:  Expiratory wheezes bilaterally Cardio:  RRR without murmur GI:  + BS, soft and nontender Extremities:  No edema Access:  AVF @ [facility] with + bruit  Dialysis Orders: MWF @ [facility]  4 hrs 74 kg 40/A1.5 2K/2.25Ca Profile 2 AVF @ [facility] No Heparin  Calcitriol 0.25 mcg per HD Aranesp 100 mcg on Wed No Venofer  Assessment/Plan: 1. Acute respiratory failure - likely fluid overload with COPD and possibly PNA, on Vancomycin & Cefepime,  improved with HD.  2. C diff colitis - + yesterday, started Vancomycin PO; recent PO Cipro & Flagyl for diverticulitis. 3. Poorly functioning AVF - removed  clots yesterday, off Heparin for Hx GIB, fistulogram per IR today. 4. ESRD - HD on MWF @ [facility], K 4.8. HD tomorrow.  5. HTN/volume - BP 130/71, no meds; wt 77 kg s/p net UF 2.8 L yesterday.  6. Anemia - Hgb 9.3 on Aranesp 100 mcg on Wed.  7. Metabolic bone disease - Ca 8.6 (9.9 corrected); Calcitriol 0.25 mcg, Phoslo 3 with meals.  8. Nutrition - Alb 2.4, renal carb-mod diet, multivitamin.  9. A-fib - on Amiodarone, BB.  10. COPD - on neb & home O2.  11. Hx PVD - s/p B carotid endarterectomy  12. DM - per primary.  13. Hx Diverticulitis - @ [facility] 8/3-6, treated w/ IV Zosyn, then PO Cipro & Flagyl.     LOS: 2 days   LYLES,CHARLES 05/23/2014,9:35 AM   I have seen and examined this patient and agree with plan as outlined by C. Lyles, PA-C.  Hopeful discharge tomorrow after HD if she continues to improve. Tiger Spieker A,MD 05/23/2014 1:47 PM

## 2014-05-23 NOTE — Progress Notes (Signed)
PROGRESS NOTE  [name]:[id number] DOB: 01-02-1941 DOA: 05/21/2014 PCP: Gilford Rile, MD  Assessment/Plan: Acute respiratory failure - on 3 L O2 at home  -combination of mild fluid overload with pneumonia/COPD.  -empiric antibiotics for pneumonia.  S/p HD  COPD -  -Continue nebulizer and Pulmicort.   Abdominal pain with recent admission for diverticulitis - c/o pain in rectum not abd  -CT abdomen and pelvis ok -c diff +- treat with oral vanc for 2 weeks after abx done -chronic pain medications.  Lipase ok   ESRD on hemodialysis on Monday Wednesday and Friday -    Chronic anemia probably from ESRD - follow CBC   Code Status: full Family Communication: patient Disposition Plan: home in AM after dialysis   Consultants:  nephro  Procedures:  HD    HPI/Subjective: Patient feeling much better- anxious to go home -had a wedding this weekend for her granddaughter - no further rectal pain  Objective: Filed Vitals:   05/23/14 0537  BP: 130/71  Pulse: 82  Temp: 97.6 F (36.4 C)  Resp: 20    Intake/Output Summary (Last 24 hours) at 05/23/14 0847 Last data filed at 05/23/14 0630  Gross per 24 hour  Intake    350 ml  Output   2900 ml  Net  -2550 ml   Filed Weights   05/22/14 1004 05/22/14 1411 05/23/14 0537  Weight: 77.7 kg (171 lb 4.8 oz) 77 kg (169 lb 12.1 oz) 76.915 kg (169 lb 9.1 oz)    Exam:   General:  A+Ox3, NAD  Cardiovascular: rrr  Respiratory: minimal expiratory wheezing  Abdomen: +BS, soft  Musculoskeletal: no edema   Data Reviewed: Basic Metabolic Panel:  Recent Labs Lab 05/21/14 2056 05/22/14 0453  NA 133* 132*  K 4.7 4.8  CL 90* 90*  CO2 25 26  GLUCOSE 95 237*  BUN 31* 35*  CREATININE 6.61* 7.09*  CALCIUM 8.6 8.6   Liver Function Tests:  Recent Labs Lab 05/21/14 2056 05/22/14 0453  AST 17 15  ALT 7 7  ALKPHOS 92 98  BILITOT 0.2* 0.3  PROT 6.1 5.9*  ALBUMIN 2.6* 2.4*    Recent Labs Lab 05/22/14 0453    LIPASE 12   No results found for this basename: AMMONIA,  in the last 168 hours CBC:  Recent Labs Lab 05/21/14 2056 05/22/14 0453  WBC 14.9* 14.6*  NEUTROABS  --  14.1*  HGB 9.7* 9.3*  HCT 33.1* 31.5*  MCV 97.9 97.2  PLT 250 263   Cardiac Enzymes:  Recent Labs Lab 05/22/14 0453  TROPONINI <0.30   BNP (last 3 results)  Recent Labs  08/27/13 2220 04/19/14 0410  PROBNP 14565.0* 30133.0*   CBG:  Recent Labs Lab 05/22/14 0213 05/22/14 0659 05/22/14 1735 05/22/14 2144 05/23/14 0603  GLUCAP 264* 194* 188* 137* 120*    Recent Results (from the past 240 hour(s))  CULTURE, BLOOD (ROUTINE X 2)     Status: None   Collection Time    05/21/14  8:56 PM      Result Value Ref Range Status   Specimen Description BLOOD LEFT ARM   Final   Special Requests BOTTLES DRAWN AEROBIC ONLY 4CC   Final   Culture  Setup Time     Final   Value: 05/22/2014 01:48     Performed at [facility]   Culture     Final   Value:        BLOOD CULTURE RECEIVED NO GROWTH TO DATE  CULTURE WILL BE HELD FOR 5 DAYS BEFORE ISSUING A FINAL NEGATIVE REPORT     Performed at [facility]   Report Status PENDING   Incomplete  CLOSTRIDIUM DIFFICILE BY PCR     Status: Abnormal   Collection Time    05/22/14  3:33 PM      Result Value Ref Range Status   C difficile by pcr POSITIVE (*) NEGATIVE Final   Comment: CRITICAL RESULT CALLED TO, READ BACK BY AND VERIFIED WITH:     R.COLUMBRES,RN AT 1815 BY L.PITT 05/22/14     Studies: Ct Abdomen W Contrast  05/22/2014   CLINICAL DATA:  Abdominal pain. Right nephrectomy for renal cell carcinoma 10 years ago. Appendectomy.  EXAM: CT ABDOMEN WITH CONTRAST  TECHNIQUE: Multidetector CT imaging of the abdomen was performed using the standard protocol following bolus administration of intravenous contrast.  CONTRAST:  156mL OMNIPAQUE IOHEXOL 300 MG/ML  SOLN  COMPARISON:  05/01/2014  FINDINGS: Lower Chest: Bibasilar atelectasis. Mild cardiomegaly with  small bilateral pleural effusions.  Abdomen: Normal liver. Splenic size upper normal with subcentimeter nonspecific peripheral hypo attenuating foci within. These are of indeterminate acuity, but appear new since the most recent contrast-enhanced study of 07/27/2012.  Normal stomach, pancreas. Gallbladder borderline distended, without pericholecystic edema or biliary ductal dilatation.  Normal right adrenal gland. Similar left adrenal thickening/nodularity.  Moderate left renal atrophy. Minimally complex lower pole left renal cyst (calcification dependently). Other left renal lesions which are consistent with cysts. Right nephrectomy, without locally recurrent disease.  Advanced aortic and branch vessel atherosclerosis. No retroperitoneal or retrocrural adenopathy.  Extensive colonic diverticulosis. Normal terminal ileum. Normal abdominal small bowel without ascites.  Bones/Musculoskeletal:  No acute osseous abnormality.  IMPRESSION: 1. Multiple low-density subcapsular foci within the spleen. These are indeterminate, but appear new since 07/27/2012. Differential considerations include small volume splenic infarcts versus interval development of lymphangiomas. 2. No other explanation for abdominal pain. 3. Borderline gallbladder distention, without specific evidence of acute cholecystitis. 4. Right nephrectomy without locally recurrent or metastatic disease. 5. Small bilateral pleural effusions. 6. Advanced atherosclerosis.   Electronically Signed   By: Abigail Miyamoto M.D.   On: 05/22/2014 08:17   Dg Chest Port 1 View  05/21/2014   CLINICAL DATA:  Difficulty breathing and cough  EXAM: PORTABLE CHEST - 1 VIEW  COMPARISON:  March 01, 2014  FINDINGS: There is cardiomegaly with pulmonary venous hypertension. There is generalized interstitial edema. There is a small area of infiltrate in the lateral right base. No adenopathy. There is atherosclerotic change in aorta.  IMPRESSION: Findings felt to represent chronic  congestive heart failure. Superimposed infiltrate lateral right base.   Electronically Signed   By: Lowella Grip M.D.   On: 05/21/2014 21:25    Scheduled Meds: . amiodarone  200 mg Oral Daily  . antiseptic oral rinse  7 mL Mouth Rinse BID  . calcium acetate  2,001 mg Oral TID WC  . ceFEPime (MAXIPIME) IV  2 g Intravenous Q M,W,F-1800  . Chlorhexidine Gluconate Cloth  6 each Topical Q0600  . [START ON 05/24/2014] darbepoetin (ARANESP) injection - DIALYSIS  100 mcg Intravenous Q Wed-HD  . docusate sodium  100 mg Oral BID  . enoxaparin (LOVENOX) injection  30 mg Subcutaneous Q24H  . ezetimibe  10 mg Oral QHS  . feeding supplement (NEPRO CARB STEADY)  237 mL Oral Q24H  . feeding supplement (RESOURCE BREEZE)  1 Container Oral Q24H  . fluconazole  50 mg Oral Once  .  insulin aspart  0-9 Units Subcutaneous TID WC  . metoCLOPramide  5 mg Oral BID  . multivitamin  1 tablet Oral Q lunch  . mupirocin ointment  1 application Nasal BID  . rOPINIRole  1 mg Oral BID  . saccharomyces boulardii  250 mg Oral BID  . sodium chloride  3 mL Intravenous Q12H  . sodium chloride  3 mL Intravenous Q12H  . vancomycin  125 mg Oral QID  . vancomycin  750 mg Intravenous Q M,W,F-HD   Continuous Infusions:  Antibiotics Given (last 72 hours)   Date/Time Action Medication Dose Rate   05/22/14 0400 Given   vancomycin (VANCOCIN) 500 mg in sodium chloride 0.9 % 100 mL IVPB 500 mg 100 mL/hr   05/22/14 0631 Given   ceFEPIme (MAXIPIME) 2 g in dextrose 5 % 50 mL IVPB 2 g 100 mL/hr   05/22/14 1340 Given   vancomycin (VANCOCIN) IVPB 750 mg/150 ml premix 750 mg 150 mL/hr   05/22/14 1808 Given   ceFEPIme (MAXIPIME) 2 g in dextrose 5 % 50 mL IVPB 2 g 100 mL/hr   05/22/14 2230 Given   vancomycin (VANCOCIN) 50 mg/mL oral solution 125 mg 125 mg       Principal Problem:   Acute respiratory failure with hypoxia Active Problems:   Pneumonia   ESRD on dialysis   Abdominal pain   Acute respiratory failure    Time  spent: 25 min    [name]  [facility] Pager [phone] If 7PM-7AM, please contact night-coverage at [email or web address], password [facility] 05/23/2014, 8:47 AM  LOS: 2 days

## 2014-05-23 NOTE — Care Management Note (Addendum)
    Page 1 of 1   05/26/2014     4:45:55 PM CARE MANAGEMENT NOTE 05/26/2014  Patient:  [name],[name]   Account Number:  [id number]  Date Initiated:  05/23/2014  Documentation initiated by:  HUTCHINSON,CRYSTAL  Subjective/Objective Assessment:   CHF and CAP     Action/Plan:   CM to follow for disposition needs   Anticipated DC Date:  05/26/2014   Anticipated DC Plan:  HOME/SELF CARE  In-house referral  [place]  CM consult      Choice offered to / List presented to:             Status of service:  Completed, signed off Medicare Important Message given?  YES (If response is "NO", the following Medicare IM given date fields will be blank) Date Medicare IM given:  05/24/2014 Medicare IM given by:  [facility] Date Additional Medicare IM given:  05/26/2014 Additional Medicare IM given by:  Adali Pennings  Discharge Disposition:  HOME/SELF CARE  Per UR Regulation:  Reviewed for med. necessity/level of care/duration of stay  If discussed at [place] of Stay Meetings, dates discussed:   05/25/2014    Comments:  05/25/14 [place], RN, BSN, [place] [phone] PT IS COVERED 100% FOR 125MG TABLET. 50 MG SOLUTION NOT COVERED. PT HAS MET OUT-OF-POCKET. TABLET REQUIRES PRIOR AUTH [id number].  05/24/14 [place], RN,BSN, NCM [phone] Spoke with pt at bedside regarding discharge planning.  Pt wishes to speak with financial counselor regarding mounting hospital bills; will notify Aleene Davidson, [facility].  Crystal Hutchinson RN, BSN, MSHL, CCM  Nurse - Case Manager,  (Unit Learned432 [phone]  05/23/2014 HD:  M-W-F

## 2014-05-23 NOTE — Procedures (Signed)
Normally functioning right upper arm native AVF without peripheral or central stenosis.  No intervention performed.  No immediate post procedural complication.

## 2014-05-23 NOTE — Progress Notes (Signed)
UR completed Jess Sulak K. Aljean Horiuchi, RN, BSN, MSHL, CCM  05/23/2014 4:25 PM

## 2014-05-24 LAB — CBC
HCT: 28.8 % — ABNORMAL LOW (ref 36.0–46.0)
Hemoglobin: 8.3 g/dL — ABNORMAL LOW (ref 12.0–15.0)
MCH: 28.3 pg (ref 26.0–34.0)
MCHC: 28.8 g/dL — ABNORMAL LOW (ref 30.0–36.0)
MCV: 98.3 fL (ref 78.0–100.0)
Platelets: 240 10*3/uL (ref 150–400)
RBC: 2.93 MIL/uL — ABNORMAL LOW (ref 3.87–5.11)
RDW: 16.9 % — ABNORMAL HIGH (ref 11.5–15.5)
WBC: 15.4 10*3/uL — ABNORMAL HIGH (ref 4.0–10.5)

## 2014-05-24 LAB — GLUCOSE, CAPILLARY
GLUCOSE-CAPILLARY: 113 mg/dL — AB (ref 70–99)
Glucose-Capillary: 109 mg/dL — ABNORMAL HIGH (ref 70–99)
Glucose-Capillary: 126 mg/dL — ABNORMAL HIGH (ref 70–99)

## 2014-05-24 LAB — RENAL FUNCTION PANEL
ALBUMIN: 2.2 g/dL — AB (ref 3.5–5.2)
ANION GAP: 14 (ref 5–15)
BUN: 46 mg/dL — ABNORMAL HIGH (ref 6–23)
CO2: 25 mEq/L (ref 19–32)
Calcium: 8.2 mg/dL — ABNORMAL LOW (ref 8.4–10.5)
Chloride: 95 mEq/L — ABNORMAL LOW (ref 96–112)
Creatinine, Ser: 6.14 mg/dL — ABNORMAL HIGH (ref 0.50–1.10)
GFR calc Af Amer: 7 mL/min — ABNORMAL LOW (ref >90)
GFR calc non Af Amer: 6 mL/min — ABNORMAL LOW (ref >90)
Glucose, Bld: 130 mg/dL — ABNORMAL HIGH (ref 70–99)
PHOSPHORUS: 5.4 mg/dL — AB (ref 2.3–4.6)
POTASSIUM: 4.4 meq/L (ref 3.7–5.3)
Sodium: 134 mEq/L — ABNORMAL LOW (ref 137–147)

## 2014-05-24 MED ORDER — ENOXAPARIN SODIUM 30 MG/0.3ML ~~LOC~~ SOLN
30.0000 mg | SUBCUTANEOUS | Status: DC
  Administered 2014-05-24 – 2014-05-26 (×3): 30 mg via SUBCUTANEOUS
  Filled 2014-05-24 (×3): qty 0.3

## 2014-05-24 MED ORDER — DARBEPOETIN ALFA-POLYSORBATE 100 MCG/0.5ML IJ SOLN
INTRAMUSCULAR | Status: AC
  Filled 2014-05-24: qty 0.5

## 2014-05-24 MED ORDER — ALTEPLASE 2 MG IJ SOLR
2.0000 mg | Freq: Once | INTRAMUSCULAR | Status: DC | PRN
  Filled 2014-05-24: qty 2

## 2014-05-24 MED ORDER — NEPRO/CARBSTEADY PO LIQD
237.0000 mL | ORAL | Status: DC | PRN
  Filled 2014-05-24: qty 237

## 2014-05-24 MED ORDER — SODIUM CHLORIDE 0.9 % IV SOLN: 100.0000 mL | INTRAVENOUS | Status: DC | PRN

## 2014-05-24 MED ORDER — CEFUROXIME AXETIL 500 MG PO TABS
500.0000 mg | ORAL_TABLET | Freq: Every day | ORAL | Status: DC
  Administered 2014-05-24 – 2014-05-25 (×2): 500 mg via ORAL
  Filled 2014-05-24 (×3): qty 1

## 2014-05-24 MED ORDER — HEPARIN SODIUM (PORCINE) 1000 UNIT/ML DIALYSIS: 1000.0000 [IU] | INTRAMUSCULAR | Status: DC | PRN

## 2014-05-24 MED ORDER — HYDROCODONE-ACETAMINOPHEN 5-325 MG PO TABS
1.0000 | ORAL_TABLET | Freq: Four times a day (QID) | ORAL | Status: DC | PRN
  Administered 2014-05-25 – 2014-05-26 (×4): 1 via ORAL
  Filled 2014-05-24 (×5): qty 1

## 2014-05-24 MED ORDER — PENTAFLUOROPROP-TETRAFLUOROETH EX AERO: 1.0000 "application " | INHALATION_SPRAY | CUTANEOUS | Status: DC | PRN

## 2014-05-24 MED ORDER — LIDOCAINE HCL (PF) 1 % IJ SOLN: 5.0000 mL | INTRAMUSCULAR | Status: DC | PRN

## 2014-05-24 MED ORDER — LIDOCAINE-PRILOCAINE 2.5-2.5 % EX CREA
1.0000 "application " | TOPICAL_CREAM | CUTANEOUS | Status: DC | PRN
  Filled 2014-05-24: qty 5

## 2014-05-24 NOTE — Progress Notes (Signed)
Subjective:  Seen on dialysis, frequent watery diarrhea, breathing better, but still with cough  Objective: Vital signs in last 24 hours: Temp:  [97.7 F (36.5 C)-98 F (36.7 C)] 97.7 F (36.5 C) (08/26 0730) Pulse Rate:  [75-83] 83 (08/26 0900) Resp:  [15-22] 15 (08/26 0900) BP: (100-151)/(37-89) 100/66 mmHg (08/26 0900) SpO2:  [94 %-100 %] 94 % (08/26 0730) Weight:  [73.8 kg (162 lb 11.2 oz)] 73.8 kg (162 lb 11.2 oz) (08/26 0730) Weight change:   Intake/Output from previous day: 08/25 0701 - 08/26 0700 In: 720 [P.O.:720] Out: 200 [Urine:200] Intake/Output this shift:   Lab Results:  Recent Labs  05/22/14 0453 05/24/14 0515  WBC 14.6* 15.4*  HGB 9.3* 8.3*  HCT 31.5* 28.8*  PLT 263 240   BMET:  Recent Labs  05/22/14 0453 05/24/14 0515  NA 132* 134*  K 4.8 4.4  CL 90* 95*  CO2 26 25  GLUCOSE 237* 130*  BUN 35* 46*  CREATININE 7.09* 6.14*  CALCIUM 8.6 8.2*  ALBUMIN 2.4* 2.2*   No results found for this basename: PTH,  in the last 72 hours Iron Studies: No results found for this basename: IRON, TIBC, TRANSFERRIN, FERRITIN,  in the last 72 hours  Studies/Results: Ir Shuntogram/ Fistulagram Right Mod Sed  05/23/2014   INDICATION: Decreased flows at dialysis.  EXAM: DIALYSIS AV SHUNTOGRAM/FISTULAGRAM  COMPARISON:  Right upper extremity native AV fistulagram -09/20/2012  MEDICATIONS: None.  CONTRAST:  50 mL OMNIPAQUE IOHEXOL 300 MG/ML  SOLN  ANESTHESIA/SEDATION: None  FLUOROSCOPY TIME:  30 seconds.  COMPLICATIONS: None immediate  PROCEDURE: The skin overlying the right upper arm native av fistula was prepped with Betadine in a sterile fashion, and a sterile drape was applied covering the operative field. A diagnostic shunt study was performed via an 18 gauge angiocatheter introduced into venous outflow. Venous drainage was assessed to the level of the central veins in the chest. Proximal shunt was studied by reflux maneuver with temporary compression of venous outflow.   The angiocath was removed and hemostasis was achieved with manual compression. A dressing was placed. The patient tolerated the procedure well without immediate post procedural complication.  FINDINGS: The native right upper arm brachiocephalic AV fistula is widely patent through the level of the cephalic arch. There is a very minimal collateralization of flow from the hypertrophied cephalic vein to the deep venous system of the right upper arm though these venous collaterals do not result in significant steal phenomena.  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  The central venous system of the right upper extremity is widely patent to the level of the right atrium.  Reflux shuntogram demonstrates wide patency of the arterial limb and anastomosis.  IMPRESSION: Normally functioning right upper arm brachiocephalic AV fistula without peripheral or central stenosis. No intervention performed.  ACCESS: This access remains amenable to future percutaneous interventions as clinically indicated.   Electronically Signed   By: Sandi Mariscal M.D.   On: 05/23/2014 16:54   EXAM:  General appearance: Alert, in no apparent distress  Resp: Expiratory wheezes bilaterally  Cardio: RRR without murmur  GI: + BS, soft and nontender  Extremities: No edema  Access: AVF @ RUA with BFR 400 cc/min  Dialysis Orders: MWF @ [facility]  4 hrs 74 kg 40/A1.5 2K/2.25Ca Profile 2 AVF @ RUA No Heparin  Calcitriol 0.25 mcg per HD Aranesp 100 mcg on Wed No  Venofer  Assessment/Plan: 1. Acute respiratory failure - likely fluid overload with COPD and possibly PNA, on Vancomycin & Cefepime, improved with HD.  2. C diff colitis - + on 8/24, started Vancomycin PO; recent PO Cipro & Flagyl for diverticulitis. 3. Poorly functioning AVF - removed clots on 8/24, off Heparin for Hx GIB, normal fistulogram per IR yesterday.  4. ESRD - HD on MWF @  [facility], K 4.4.  HD today. 5. HTN/volume - BP 100/66, no meds; below EDW.  6. Anemia - Hgb down to 8.3 on Aranesp 100 mcg on Wed.  7. Metabolic bone disease - Ca 8.6 (10 corrected); Calcitriol 0.25 mcg, Phoslo 3 with meals.  8. Nutrition - Alb 2.2, renal carb-mod diet, multivitamin.  9. A-fib - on Amiodarone, BB.  10. COPD - on neb & home O2  11. DM - per primary.  12. Hx Diverticulitis - @ [facility] 8/3-6, treated w/ IV Zosyn, then PO Cipro & Flagyl.    LOS: 3 days   LYLES,CHARLES 05/24/2014,9:24 AM  Pt seen, examined and agree w A/P as above. Lower dry wt if possible w HD today. Kelly Splinter MD pager [phone]    cell [phone] 05/24/2014, 9:48 AM

## 2014-05-24 NOTE — Progress Notes (Signed)
PROGRESS NOTE  [name]:[id number] DOB: [birth year] DOA: 05/21/2014 PCP: Gilford Rile, MD  Assessment/Plan: Acute respiratory failure - on 3 L O2 at home for COPD -combination of mild fluid overload with pneumonia/COPD.  -improving, STop Vanc/Cefepime, Blood Cx negative -change to PO levaquin for 4more days  -volume managed with HD  COPD -  -Continue nebulizer and Pulmicort.   Cdiff colitis -continue PO Vanc per pharmacy -stop other Abx, as soon as possible  ESRD on hemodialysis on Monday Wednesday and Friday - per Renal  Anemia of chronic disease -aranesp with HD  DM -Dc glipizide, SSI  Code Status: full Family Communication: patient Disposition Plan: home when stable   Consultants:  Renal  Procedures:  HD    HPI/Subjective: Still with profuse diarrhea, breathing improving, still coughing   Objective: Filed Vitals:   05/24/14 1049  BP: 134/57  Pulse: 87  Temp: 97.9 F (36.6 C)  Resp: 23    Intake/Output Summary (Last 24 hours) at 05/24/14 1127 Last data filed at 05/24/14 1049  Gross per 24 hour  Intake    360 ml  Output   3200 ml  Net  -2840 ml   Filed Weights   05/23/14 0537 05/24/14 0730 05/24/14 1049  Weight: 76.915 kg (169 lb 9.1 oz) 73.8 kg (162 lb 11.2 oz) 71.1 kg (156 lb 12 oz)    Exam:   General:  A+Ox3, NAD  Cardiovascular: rrr  Respiratory: ronchi at B bases  Abdomen: +BS, soft, NT, BS present  Musculoskeletal: no edema   Data Reviewed: Basic Metabolic Panel:  Recent Labs Lab 05/21/14 2056 05/22/14 0453 05/24/14 0515  NA 133* 132* 134*  K 4.7 4.8 4.4  CL 90* 90* 95*  CO2 25 26 25   GLUCOSE 95 237* 130*  BUN 31* 35* 46*  CREATININE 6.61* 7.09* 6.14*  CALCIUM 8.6 8.6 8.2*  PHOS  --   --  5.4*   Liver Function Tests:  Recent Labs Lab 05/21/14 2056 05/22/14 0453 05/24/14 0515  AST 17 15  --   ALT 7 7  --   ALKPHOS 92 98  --   BILITOT 0.2* 0.3  --   PROT 6.1 5.9*  --   ALBUMIN 2.6* 2.4* 2.2*     Recent Labs Lab 05/22/14 0453  LIPASE 12   No results found for this basename: AMMONIA,  in the last 168 hours CBC:  Recent Labs Lab 05/21/14 2056 05/22/14 0453 05/24/14 0515  WBC 14.9* 14.6* 15.4*  NEUTROABS  --  14.1*  --   HGB 9.7* 9.3* 8.3*  HCT 33.1* 31.5* 28.8*  MCV 97.9 97.2 98.3  PLT 250 263 240   Cardiac Enzymes:  Recent Labs Lab 05/22/14 0453  TROPONINI <0.30   BNP (last 3 results)  Recent Labs  08/27/13 2220 04/19/14 0410  PROBNP 14565.0* 30133.0*   CBG:  Recent Labs Lab 05/22/14 2144 05/23/14 0603 05/23/14 1641 05/23/14 2103 05/24/14 0703  GLUCAP 137* 120* 105* 127* 109*    Recent Results (from the past 240 hour(s))  CULTURE, BLOOD (ROUTINE X 2)     Status: None   Collection Time    05/21/14  8:56 PM      Result Value Ref Range Status   Specimen Description BLOOD LEFT ARM   Final   Special Requests BOTTLES DRAWN AEROBIC ONLY 4CC   Final   Culture  Setup Time     Final   Value: 05/22/2014 01:48     Performed at [facility]  Culture     Final   Value:        BLOOD CULTURE RECEIVED NO GROWTH TO DATE CULTURE WILL BE HELD FOR 5 DAYS BEFORE ISSUING A FINAL NEGATIVE REPORT     Performed at [facility]   Report Status PENDING   Incomplete  CLOSTRIDIUM DIFFICILE BY PCR     Status: Abnormal   Collection Time    05/22/14  3:33 PM      Result Value Ref Range Status   C difficile by pcr POSITIVE (*) NEGATIVE Final   Comment: CRITICAL RESULT CALLED TO, READ BACK BY AND VERIFIED WITH:     R.COLUMBRES,RN AT 1815 BY L.PITT 05/22/14     Studies: Ir Shuntogram/ Fistulagram Right Mod Sed  05/23/2014   INDICATION: Decreased flows at dialysis.  EXAM: DIALYSIS AV SHUNTOGRAM/FISTULAGRAM  COMPARISON:  Right upper extremity native AV fistulagram -09/20/2012  MEDICATIONS: None.  CONTRAST:  50 mL OMNIPAQUE IOHEXOL 300 MG/ML  SOLN  ANESTHESIA/SEDATION: None  FLUOROSCOPY TIME:  30 seconds.  COMPLICATIONS: None immediate  PROCEDURE: The  skin overlying the right upper arm native av fistula was prepped with Betadine in a sterile fashion, and a sterile drape was applied covering the operative field. A diagnostic shunt study was performed via an 18 gauge angiocatheter introduced into venous outflow. Venous drainage was assessed to the level of the central veins in the chest. Proximal shunt was studied by reflux maneuver with temporary compression of venous outflow.  The angiocath was removed and hemostasis was achieved with manual compression. A dressing was placed. The patient tolerated the procedure well without immediate post procedural complication.  FINDINGS: The native right upper arm brachiocephalic AV fistula is widely patent through the level of the cephalic arch. There is a very minimal collateralization of flow from the hypertrophied cephalic vein to the deep venous system of the right upper arm though these venous collaterals do not result in significant steal phenomena.  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  The central venous system of the right upper extremity is widely patent to the level of the right atrium.  Reflux shuntogram demonstrates wide patency of the arterial limb and anastomosis.  IMPRESSION: Normally functioning right upper arm brachiocephalic AV fistula without peripheral or central stenosis. No intervention performed.  ACCESS: This access remains amenable to future percutaneous interventions as clinically indicated.   Electronically Signed   By: Sandi Mariscal M.D.   On: 05/23/2014 16:54    Scheduled Meds: . amiodarone  200 mg Oral Daily  . antiseptic oral rinse  7 mL Mouth Rinse BID  . calcium acetate  2,001 mg Oral TID WC  . Chlorhexidine Gluconate Cloth  6 each Topical Q0600  . darbepoetin (ARANESP) injection - DIALYSIS  100 mcg Intravenous Q Wed-HD  . docusate sodium  100 mg Oral BID  . enoxaparin  (LOVENOX) injection  30 mg Subcutaneous Q24H  . ezetimibe  10 mg Oral QHS  . feeding supplement (NEPRO CARB STEADY)  237 mL Oral Q24H  . feeding supplement (RESOURCE BREEZE)  1 Container Oral Q24H  . insulin aspart  0-9 Units Subcutaneous TID WC  . metoCLOPramide  5 mg Oral BID  . multivitamin  1 tablet Oral Q lunch  . mupirocin ointment  1 application Nasal BID  . rOPINIRole  1 mg Oral BID  . saccharomyces boulardii  250 mg Oral BID  . sodium  chloride  3 mL Intravenous Q12H  . sodium chloride  3 mL Intravenous Q12H  . vancomycin  125 mg Oral QID   Continuous Infusions:  Antibiotics Given (last 72 hours)   Date/Time Action Medication Dose Rate   05/22/14 0400 Given   vancomycin (VANCOCIN) 500 mg in sodium chloride 0.9 % 100 mL IVPB 500 mg 100 mL/hr   05/22/14 0631 Given   ceFEPIme (MAXIPIME) 2 g in dextrose 5 % 50 mL IVPB 2 g 100 mL/hr   05/22/14 1340 Given   vancomycin (VANCOCIN) IVPB 750 mg/150 ml premix 750 mg 150 mL/hr   05/22/14 1808 Given   ceFEPIme (MAXIPIME) 2 g in dextrose 5 % 50 mL IVPB 2 g 100 mL/hr   05/22/14 2230 Given   vancomycin (VANCOCIN) 50 mg/mL oral solution 125 mg 125 mg    05/23/14 1013 Given   vancomycin (VANCOCIN) 50 mg/mL oral solution 125 mg 125 mg    05/23/14 1754 Given   vancomycin (VANCOCIN) 50 mg/mL oral solution 125 mg 125 mg    05/23/14 1755 Given   vancomycin (VANCOCIN) 50 mg/mL oral solution 125 mg 125 mg    05/23/14 2251 Given   vancomycin (VANCOCIN) 50 mg/mL oral solution 125 mg 125 mg    05/24/14 1001 Given   vancomycin (VANCOCIN) IVPB 750 mg/150 ml premix 750 mg 150 mL/hr      Principal Problem:   Acute respiratory failure with hypoxia Active Problems:   Pneumonia   ESRD on dialysis   Abdominal pain   Acute respiratory failure    Time spent: 25 min    [place] [facility] Pager [phone] If 7PM-7AM, please contact night-coverage at [email or web address], password [facility] 05/24/2014, 11:27 AM  LOS: 3 days

## 2014-05-25 DIAGNOSIS — N189 Chronic kidney disease, unspecified: Secondary | ICD-10-CM

## 2014-05-25 DIAGNOSIS — D631 Anemia in chronic kidney disease: Secondary | ICD-10-CM

## 2014-05-25 DIAGNOSIS — N039 Chronic nephritic syndrome with unspecified morphologic changes: Secondary | ICD-10-CM

## 2014-05-25 LAB — GLUCOSE, CAPILLARY
GLUCOSE-CAPILLARY: 99 mg/dL (ref 70–99)
GLUCOSE-CAPILLARY: 99 mg/dL (ref 70–99)
Glucose-Capillary: 105 mg/dL — ABNORMAL HIGH (ref 70–99)
Glucose-Capillary: 122 mg/dL — ABNORMAL HIGH (ref 70–99)
Glucose-Capillary: 160 mg/dL — ABNORMAL HIGH (ref 70–99)

## 2014-05-25 LAB — CBC
HEMATOCRIT: 32.7 % — AB (ref 36.0–46.0)
Hemoglobin: 9.4 g/dL — ABNORMAL LOW (ref 12.0–15.0)
MCH: 28.5 pg (ref 26.0–34.0)
MCHC: 28.7 g/dL — ABNORMAL LOW (ref 30.0–36.0)
MCV: 99.1 fL (ref 78.0–100.0)
Platelets: 223 10*3/uL (ref 150–400)
RBC: 3.3 MIL/uL — ABNORMAL LOW (ref 3.87–5.11)
RDW: 16.4 % — ABNORMAL HIGH (ref 11.5–15.5)
WBC: 11.8 10*3/uL — ABNORMAL HIGH (ref 4.0–10.5)

## 2014-05-25 NOTE — Evaluation (Addendum)
Physical Therapy Evaluation Patient Details Name: [name] MRN: [id number] DOB: September 09, 1941 Today's Date: 05/25/2014   History of Present Illness  73 y.o. female with h/o ESRD on HD, DM, COPD (on 3L O2)  admitted with N/V, loose stools, cough, SOB. Dx of fluid overload, PNA, c-diff, acute respiratory failure.   Clinical Impression  *Pt ambulated 250' without assistive device, on 3L O2. SaO2 93% with walking. Pt is at baseline with mobility. No further acute PT indicated. OK to DC home from PT standpoint. **    Follow Up Recommendations No PT follow up    Equipment Recommendations  None recommended by PT    Recommendations for Other Services       Precautions / Restrictions Precautions Precautions: None Restrictions Weight Bearing Restrictions: No      Mobility  Bed Mobility Overal bed mobility: Independent                Transfers Overall transfer level: Modified independent Equipment used: None             General transfer comment: used armrests on recliner to push up  Ambulation/Gait Ambulation/Gait assistance: Independent Ambulation Distance (Feet): 250 Feet Assistive device: None Gait Pattern/deviations: WFL(Within Functional Limits)   Gait velocity interpretation: at or above normal speed for age/gender General Gait Details: for last 30' of ambulation pt occaissionally readched for wall/handrail for balance 2* fatigue, pt states this is baseline for her. Pt walked with 3L O2, SaO2 93% while walking.   Stairs            Wheelchair Mobility    Modified Rankin (Stroke Patients Only)       Balance Overall balance assessment: Modified Independent (pt "furniture walks" at home; denies falls in past 1 year)                                           Pertinent Vitals/Pain Pain Assessment: 0-10 Pain Score: 7  Pain Location: "private parts due to infection" Pain Descriptors / Indicators: Burning Pain Intervention(s):  Monitored during session;RN gave pain meds during session    Home Living Family/patient expects to be discharged to:: Private residence Living Arrangements: Children Available Help at Discharge: Family;Available PRN/intermittently Type of Home: House Home Access: Stairs to enter Entrance Stairs-Rails: Right Entrance Stairs-Number of Steps: 5 Home Layout: One level Home Equipment: Walker - 2 wheels Additional Comments: has home O2, was on 3L continuous    Prior Function Level of Independence: Independent               Hand Dominance        Extremity/Trunk Assessment   Upper Extremity Assessment: Overall WFL for tasks assessed           Lower Extremity Assessment: Overall WFL for tasks assessed      Cervical / Trunk Assessment: Normal  Communication   Communication: No difficulties  Cognition Arousal/Alertness: Awake/alert Behavior During Therapy: WFL for tasks assessed/performed Overall Cognitive Status: Within Functional Limits for tasks assessed                      General Comments      Exercises        Assessment/Plan    PT Assessment Patent does not need any further PT services  PT Diagnosis     PT Problem List    PT Treatment Interventions  PT Goals (Current goals can be found in the Care Plan section) Acute Rehab PT Goals PT Goal Formulation: No goals set, d/c therapy    Frequency     Barriers to discharge        Co-evaluation               End of Session Equipment Utilized During Treatment: Oxygen Activity Tolerance: Patient tolerated treatment well Patient left: in bed;with call bell/phone within reach Nurse Communication: Mobility status         Time: 7903-8333 PT Time Calculation (min): 23 min   Charges:   PT Evaluation $Initial PT Evaluation Tier I: 1 Procedure PT Treatments $Gait Training: 8-22 mins   PT G Codes:          Philomena Doheny 05/25/2014, 11:22 AM [phone]

## 2014-05-25 NOTE — Progress Notes (Addendum)
PROGRESS NOTE  [name]:[id number] DOB: [birth year] DOA: 05/21/2014 PCP: Gilford Rile, MD  Assessment/Plan: Acute respiratory failure - on 3 L O2 at home for COPD -combination of mild fluid overload with pneumonia/COPD.  -improving, STopped Vanc/Cefepime, Blood Cx negative -changed to PO levaquin day 2/4  -volume managed with HD  COPD  -Continue nebulizer and Pulmicort.   Cdiff colitis -still with moderate diarrhea -continue PO Vanc QID for 2 weeks after completing PO Levaquin -stop other Abx, as soon as possible  ESRD on hemodialysis on Monday Wednesday and Friday - per Renal  Anemia of chronic disease -aranesp with HD  DM -Dc glipizide, SSI  Code Status: full Family Communication: patient Disposition Plan: home when diarrhea slows down   Consultants:  Renal  Procedures:  HD  HPI/Subjective: Diarrhea x3  by 8:30am today Breathing good  Objective: Filed Vitals:   05/25/14 0434  BP: 143/79  Pulse: 79  Temp: 98 F (36.7 C)  Resp: 20    Intake/Output Summary (Last 24 hours) at 05/25/14 1241 Last data filed at 05/25/14 0903  Gross per 24 hour  Intake    600 ml  Output    500 ml  Net    100 ml   Filed Weights   05/24/14 0730 05/24/14 1049 05/25/14 0434  Weight: 73.8 kg (162 lb 11.2 oz) 71.1 kg (156 lb 12 oz) 70.9 kg (156 lb 4.9 oz)    Exam:   General:  A+Ox3, NAD  Cardiovascular: rrr  Respiratory: ronchi at B bases  Abdomen: +BS, soft, NT, BS present  Musculoskeletal: no edema   Data Reviewed: Basic Metabolic Panel:  Recent Labs Lab 05/21/14 2056 05/22/14 0453 05/24/14 0515  NA 133* 132* 134*  K 4.7 4.8 4.4  CL 90* 90* 95*  CO2 25 26 25   GLUCOSE 95 237* 130*  BUN 31* 35* 46*  CREATININE 6.61* 7.09* 6.14*  CALCIUM 8.6 8.6 8.2*  PHOS  --   --  5.4*   Liver Function Tests:  Recent Labs Lab 05/21/14 2056 05/22/14 0453 05/24/14 0515  AST 17 15  --   ALT 7 7  --   ALKPHOS 92 98  --   BILITOT 0.2* 0.3  --   PROT 6.1  5.9*  --   ALBUMIN 2.6* 2.4* 2.2*    Recent Labs Lab 05/22/14 0453  LIPASE 12   No results found for this basename: AMMONIA,  in the last 168 hours CBC:  Recent Labs Lab 05/21/14 2056 05/22/14 0453 05/24/14 0515 05/25/14 0730  WBC 14.9* 14.6* 15.4* 11.8*  NEUTROABS  --  14.1*  --   --   HGB 9.7* 9.3* 8.3* 9.4*  HCT 33.1* 31.5* 28.8* 32.7*  MCV 97.9 97.2 98.3 99.1  PLT 250 263 240 223   Cardiac Enzymes:  Recent Labs Lab 05/22/14 0453  TROPONINI <0.30   BNP (last 3 results)  Recent Labs  08/27/13 2220 04/19/14 0410  PROBNP 14565.0* 30133.0*   CBG:  Recent Labs Lab 05/24/14 1134 05/24/14 1559 05/24/14 2227 05/25/14 0511 05/25/14 1217  GLUCAP 113* 126* 105* 99 122*    Recent Results (from the past 240 hour(s))  CULTURE, BLOOD (ROUTINE X 2)     Status: None   Collection Time    05/21/14  8:56 PM      Result Value Ref Range Status   Specimen Description BLOOD LEFT ARM   Final   Special Requests BOTTLES DRAWN AEROBIC ONLY 4CC   Final   Culture  Setup  Time     Final   Value: 05/22/2014 01:48     Performed at [facility]   Culture     Final   Value:        BLOOD CULTURE RECEIVED NO GROWTH TO DATE CULTURE WILL BE HELD FOR 5 DAYS BEFORE ISSUING A FINAL NEGATIVE REPORT     Performed at [facility]   Report Status PENDING   Incomplete  CLOSTRIDIUM DIFFICILE BY PCR     Status: Abnormal   Collection Time    05/22/14  3:33 PM      Result Value Ref Range Status   C difficile by pcr POSITIVE (*) NEGATIVE Final   Comment: CRITICAL RESULT CALLED TO, READ BACK BY AND VERIFIED WITH:     R.COLUMBRES,RN AT 1815 BY L.PITT 05/22/14     Studies: Ir Shuntogram/ Fistulagram Right Mod Sed  05/23/2014   INDICATION: Decreased flows at dialysis.  EXAM: DIALYSIS AV SHUNTOGRAM/FISTULAGRAM  COMPARISON:  Right upper extremity native AV fistulagram -09/20/2012  MEDICATIONS: None.  CONTRAST:  50 mL OMNIPAQUE IOHEXOL 300 MG/ML  SOLN  ANESTHESIA/SEDATION: None   FLUOROSCOPY TIME:  30 seconds.  COMPLICATIONS: None immediate  PROCEDURE: The skin overlying the right upper arm native av fistula was prepped with Betadine in a sterile fashion, and a sterile drape was applied covering the operative field. A diagnostic shunt study was performed via an 18 gauge angiocatheter introduced into venous outflow. Venous drainage was assessed to the level of the central veins in the chest. Proximal shunt was studied by reflux maneuver with temporary compression of venous outflow.  The angiocath was removed and hemostasis was achieved with manual compression. A dressing was placed. The patient tolerated the procedure well without immediate post procedural complication.  FINDINGS: The native right upper arm brachiocephalic AV fistula is widely patent through the level of the cephalic arch. There is a very minimal collateralization of flow from the hypertrophied cephalic vein to the deep venous system of the right upper arm though these venous collaterals do not result in significant steal phenomena.  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  The central venous system of the right upper extremity is widely patent to the level of the right atrium.  Reflux shuntogram demonstrates wide patency of the arterial limb and anastomosis.  IMPRESSION: Normally functioning right upper arm brachiocephalic AV fistula without peripheral or central stenosis. No intervention performed.  ACCESS: This access remains amenable to future percutaneous interventions as clinically indicated.   Electronically Signed   By: Sandi Mariscal M.D.   On: 05/23/2014 16:54    Scheduled Meds: . amiodarone  200 mg Oral Daily  . antiseptic oral rinse  7 mL Mouth Rinse BID  . calcium acetate  2,001 mg Oral TID WC  . cefUROXime  500 mg Oral Q supper  . Chlorhexidine Gluconate Cloth  6 each Topical Q0600  . darbepoetin  (ARANESP) injection - DIALYSIS  100 mcg Intravenous Q Wed-HD  . docusate sodium  100 mg Oral BID  . enoxaparin (LOVENOX) injection  30 mg Subcutaneous Q24H  . ezetimibe  10 mg Oral QHS  . feeding supplement (NEPRO CARB STEADY)  237 mL Oral Q24H  . feeding supplement (RESOURCE BREEZE)  1 Container Oral Q24H  . insulin aspart  0-9 Units Subcutaneous TID WC  . metoCLOPramide  5 mg Oral BID  . multivitamin  1 tablet Oral  Q lunch  . mupirocin ointment  1 application Nasal BID  . rOPINIRole  1 mg Oral BID  . saccharomyces boulardii  250 mg Oral BID  . sodium chloride  3 mL Intravenous Q12H  . sodium chloride  3 mL Intravenous Q12H  . vancomycin  125 mg Oral QID   Continuous Infusions:  Antibiotics Given (last 72 hours)   Date/Time Action Medication Dose Rate   05/22/14 1340 Given   vancomycin (VANCOCIN) IVPB 750 mg/150 ml premix 750 mg 150 mL/hr   05/22/14 1808 Given   ceFEPIme (MAXIPIME) 2 g in dextrose 5 % 50 mL IVPB 2 g 100 mL/hr   05/22/14 2230 Given   vancomycin (VANCOCIN) 50 mg/mL oral solution 125 mg 125 mg    05/23/14 1013 Given   vancomycin (VANCOCIN) 50 mg/mL oral solution 125 mg 125 mg    05/23/14 1754 Given   vancomycin (VANCOCIN) 50 mg/mL oral solution 125 mg 125 mg    05/23/14 1755 Given   vancomycin (VANCOCIN) 50 mg/mL oral solution 125 mg 125 mg    05/23/14 2251 Given   vancomycin (VANCOCIN) 50 mg/mL oral solution 125 mg 125 mg    05/24/14 1001 Given   vancomycin (VANCOCIN) IVPB 750 mg/150 ml premix 750 mg 150 mL/hr   05/24/14 1150 Given   vancomycin (VANCOCIN) 50 mg/mL oral solution 125 mg 125 mg    05/24/14 1530 Given   vancomycin (VANCOCIN) 50 mg/mL oral solution 125 mg 125 mg    05/24/14 1726 Given   vancomycin (VANCOCIN) 50 mg/mL oral solution 125 mg 125 mg    05/24/14 1745 Given   cefUROXime (CEFTIN) tablet 500 mg 500 mg    05/24/14 2235 Given   vancomycin (VANCOCIN) 50 mg/mL oral solution 125 mg 125 mg    05/25/14 1054 Given   vancomycin (VANCOCIN) 50  mg/mL oral solution 125 mg 125 mg       Principal Problem:   Acute respiratory failure with hypoxia Active Problems:   Pneumonia   ESRD on dialysis   Abdominal pain   Acute respiratory failure    Time spent: 25 min    [place] [facility] Pager [phone] If 7PM-7AM, please contact night-coverage at [email or web address], password [facility] 05/25/2014, 12:41 PM  LOS: 4 days

## 2014-05-25 NOTE — Progress Notes (Signed)
Subjective:  No current complaints, breathing well, one loose stool overnight  Objective: Vital signs in last 24 hours: Temp:  [97.7 F (36.5 C)-98.4 F (36.9 C)] 98 F (36.7 C) (08/27 0434) Pulse Rate:  [79-92] 79 (08/27 0434) Resp:  [18-20] 20 (08/27 0434) BP: (120-158)/(36-79) 143/79 mmHg (08/27 0434) SpO2:  [95 %-100 %] 95 % (08/27 0434) Weight:  [70.9 kg (156 lb 4.9 oz)] 70.9 kg (156 lb 4.9 oz) (08/27 0434) Weight change:   Intake/Output from previous day: 08/26 0701 - 08/27 0700 In: 480 [P.O.:480] Out: 3300 [Urine:300] Intake/Output this shift: Total I/O In: 240 [P.O.:240] Out: -   Lab Results:  Recent Labs  05/24/14 0515 05/25/14 0730  WBC 15.4* 11.8*  HGB 8.3* 9.4*  HCT 28.8* 32.7*  PLT 240 223   BMET:  Recent Labs  05/24/14 0515  NA 134*  K 4.4  CL 95*  CO2 25  GLUCOSE 130*  BUN 46*  CREATININE 6.14*  CALCIUM 8.2*  ALBUMIN 2.2*   No results found for this basename: PTH,  in the last 72 hours Iron Studies: No results found for this basename: IRON, TIBC, TRANSFERRIN, FERRITIN,  in the last 72 hours  Studies/Results: No results found.  EXAM:  General appearance: Alert, in no apparent distress  Resp: Mild expiratory wheezes bilaterally  Cardio: RRR without murmur  GI: + BS, soft and nontender  Extremities: No edema  Access: AVF @ RUA with + bruit  Dialysis Orders: MWF @ [facility]  4 hrs 74 kg 40/A1.5 2K/2.25Ca Profile 2 AVF @ RUA No Heparin  Calcitriol 0.25 mcg per HD Aranesp 100 mcg on Wed No Venofer  Assessment/Plan: 1. Acute respiratory failure - likely fluid overload with COPD and possibly PNA, on Vancomycin & Cefepime, improved with HD.  2. C diff colitis - + on 8/24, started Vancomycin PO; recent PO Cipro & Flagyl for diverticulitis.  3. Poorly functioning AVF - removed clots on 8/24, off Heparin for Hx GIB, normal fistulogram per IR 8/25.  4. ESRD - HD on MWF @ [facility], K 4.4. HD tomorrow.  5. HTN/volume - BP 143/79, no meds; below EDW.  Lower  EDW before DC. 6. Anemia - Hgb 9.4 on Aranesp 100 mcg on Wed.  7. Metabolic bone disease - Ca 8.2 (9.6 corrected); Calcitriol 0.25 mcg, Phoslo 3 with meals.  8. Nutrition - Alb 2.2, renal carb-mod diet, multivitamin.  9. A-fib - on Amiodarone, BB.  10. COPD - on neb & home O2  11. DM - per primary.  12. Hx Diverticulitis - @ [facility] 8/3-6, treated w/ IV Zosyn, then PO Cipro & Flagyl.    LOS: 4 days   LYLES,CHARLES 05/25/2014,10:58 AM  Pt seen, examined and agree w A/P as above.  Kelly Splinter MD pager [phone]    cell [phone] 05/25/2014, 2:29 PM

## 2014-05-26 LAB — RENAL FUNCTION PANEL
ALBUMIN: 2.1 g/dL — AB (ref 3.5–5.2)
ANION GAP: 21 — AB (ref 5–15)
BUN: 44 mg/dL — AB (ref 6–23)
CHLORIDE: 93 meq/L — AB (ref 96–112)
CO2: 18 mEq/L — ABNORMAL LOW (ref 19–32)
Calcium: 8.5 mg/dL (ref 8.4–10.5)
Creatinine, Ser: 6.02 mg/dL — ABNORMAL HIGH (ref 0.50–1.10)
GFR calc Af Amer: 7 mL/min — ABNORMAL LOW (ref >90)
GFR, EST NON AFRICAN AMERICAN: 6 mL/min — AB (ref >90)
Glucose, Bld: 80 mg/dL (ref 70–99)
PHOSPHORUS: 4.3 mg/dL (ref 2.3–4.6)
POTASSIUM: 5 meq/L (ref 3.7–5.3)
SODIUM: 132 meq/L — AB (ref 137–147)

## 2014-05-26 LAB — CBC
HEMATOCRIT: 32.6 % — AB (ref 36.0–46.0)
Hemoglobin: 9.4 g/dL — ABNORMAL LOW (ref 12.0–15.0)
MCH: 28.8 pg (ref 26.0–34.0)
MCHC: 28.8 g/dL — ABNORMAL LOW (ref 30.0–36.0)
MCV: 100 fL (ref 78.0–100.0)
Platelets: 216 10*3/uL (ref 150–400)
RBC: 3.26 MIL/uL — ABNORMAL LOW (ref 3.87–5.11)
RDW: 16.7 % — AB (ref 11.5–15.5)
WBC: 12.5 10*3/uL — AB (ref 4.0–10.5)

## 2014-05-26 LAB — GLUCOSE, CAPILLARY: Glucose-Capillary: 96 mg/dL (ref 70–99)

## 2014-05-26 MED ORDER — VANCOMYCIN HCL 125 MG PO CAPS: 125.0000 mg | ORAL_CAPSULE | Freq: Four times a day (QID) | ORAL | Status: DC

## 2014-05-26 MED ORDER — NEPRO/CARBSTEADY PO LIQD
237.0000 mL | ORAL | Status: DC | PRN
Start: 2014-05-26 — End: 2014-05-26
  Filled 2014-05-26: qty 237

## 2014-05-26 MED ORDER — HEPARIN SODIUM (PORCINE) 1000 UNIT/ML DIALYSIS
1000.0000 [IU] | INTRAMUSCULAR | Status: DC | PRN
  Filled 2014-05-26: qty 1

## 2014-05-26 MED ORDER — SODIUM CHLORIDE 0.9 % IV SOLN: 100.0000 mL | INTRAVENOUS | Status: DC | PRN

## 2014-05-26 MED ORDER — CEFUROXIME AXETIL 500 MG PO TABS: 500.0000 mg | ORAL_TABLET | Freq: Every day | ORAL | Status: DC

## 2014-05-26 MED ORDER — VANCOMYCIN 50 MG/ML ORAL SOLUTION: 125.0000 mg | Freq: Four times a day (QID) | ORAL | Status: DC

## 2014-05-26 MED ORDER — ALTEPLASE 2 MG IJ SOLR
2.0000 mg | Freq: Once | INTRAMUSCULAR | Status: DC | PRN
  Filled 2014-05-26: qty 2

## 2014-05-26 MED ORDER — LIDOCAINE HCL (PF) 1 % IJ SOLN: 5.0000 mL | INTRAMUSCULAR | Status: DC | PRN

## 2014-05-26 MED ORDER — LIDOCAINE-PRILOCAINE 2.5-2.5 % EX CREA
1.0000 "application " | TOPICAL_CREAM | CUTANEOUS | Status: DC | PRN
  Filled 2014-05-26: qty 5

## 2014-05-26 MED ORDER — PENTAFLUOROPROP-TETRAFLUOROETH EX AERO: 1.0000 "application " | INHALATION_SPRAY | CUTANEOUS | Status: DC | PRN

## 2014-05-26 NOTE — Progress Notes (Signed)
Subjective:   On HD, for DC today, go [place] the beach [place]. Feels well. No SOB  Objective Filed Vitals:   05/25/14 2025 05/26/14 0528 05/26/14 0953 05/26/14 0958  BP: 130/45 120/53 125/52 146/58  Pulse: 77 79 79 76  Temp: 97.9 F (36.6 C) 97.9 F (36.6 C) 97.4 F (36.3 C)   TempSrc: Oral Oral Oral   Resp: 18 18 22 20   Height:      Weight:  71.895 kg (158 lb 8 oz) 75.9 kg (167 lb 5.3 oz)   SpO2: 100%      Physical Exam General: alert and oriented. No acute distress. On HD Heart: RRR, no murmur Lungs: CTA, unlabored Abdomen: soft, nontender +BS Extremities: NO edema Dialysis Access: R AVF patent on HD  Dialysis Orders: MWF @ [facility]  4 hrs 74 kg 40/A1.5 2K/2.25Ca Profile 2 AVF @ RUA No Heparin  Calcitriol 0.25 mcg per HD Aranesp 100 mcg on Wed No Venofer  Assessment/Plan:  1. Acute respiratory failure - likely fluid overload with COPD and possibly PNA, on Vancomycin & Ceftin, improved with HD. Blood cx negative. Can cont anti outpt or po for DC? 2. C diff colitis - + on 8/24, started Vancomycin PO; recent PO Cipro & Flagyl for diverticulitis.  3. Poorly functioning AVF - removed clots on 8/24, off Heparin for Hx GIB, normal fistulogram per IR 8/25.  4. ESRD - HD on MWF @ [facility], K 5. HD now, uf goal 3L 5. HTN/volume - BP 171/62, no meds; below EDW. Lower EDW at DC. 6. Anemia - Hgb 9.4 on Aranesp 100 mcg on Wed.  7. Metabolic bone disease - Ca 8.5 (10 corrected); Calcitriol 0.25 mcg, Phoslo 3 with meals.  8. Nutrition - Alb 2.1 renal carb-mod diet, multivitamin.  9. A-fib - on Amiodarone, BB.  10. COPD - on neb & home O2  11. DM - per primary.  12. Hx Diverticulitis - @ [facility] 8/3-6, treated w/ IV Zosyn, then PO Cipro & Flagyl.   Shelle Iron, NP [place] [phone] 05/26/2014,10:12 AM  LOS: 5 days   Pt seen, examined and agree w A/P as above. For DC today.  Kelly Splinter MD pager [phone]    cell [phone] 05/26/2014, 12:48 PM    Additional  Objective Labs: Basic Metabolic Panel:  Recent Labs Lab 05/22/14 0453 05/24/14 0515 05/26/14 0631  NA 132* 134* 132*  K 4.8 4.4 5.0  CL 90* 95* 93*  CO2 26 25 18*  GLUCOSE 237* 130* 80  BUN 35* 46* 44*  CREATININE 7.09* 6.14* 6.02*  CALCIUM 8.6 8.2* 8.5  PHOS  --  5.4* 4.3   Liver Function Tests:  Recent Labs Lab 05/21/14 2056 05/22/14 0453 05/24/14 0515 05/26/14 0631  AST 17 15  --   --   ALT 7 7  --   --   ALKPHOS 92 98  --   --   BILITOT 0.2* 0.3  --   --   PROT 6.1 5.9*  --   --   ALBUMIN 2.6* 2.4* 2.2* 2.1*    Recent Labs Lab 05/22/14 0453  LIPASE 12   CBC:  Recent Labs Lab 05/21/14 2056 05/22/14 0453 05/24/14 0515 05/25/14 0730 05/26/14 0631  WBC 14.9* 14.6* 15.4* 11.8* 12.5*  NEUTROABS  --  14.1*  --   --   --   HGB 9.7* 9.3* 8.3* 9.4* 9.4*  HCT 33.1* 31.5* 28.8* 32.7* 32.6*  MCV 97.9 97.2 98.3 99.1 100.0  PLT 250 263 240 223 216   Blood Culture    Component Value Date/Time   SDES BLOOD LEFT ARM 05/21/2014 2056   SPECREQUEST BOTTLES DRAWN AEROBIC ONLY 4CC 05/21/2014 2056   CULT  Value:        BLOOD CULTURE RECEIVED NO GROWTH TO DATE CULTURE WILL BE HELD FOR 5 DAYS BEFORE ISSUING A FINAL NEGATIVE REPORT Performed at [facility] 05/21/2014 2056   REPTSTATUS PENDING 05/21/2014 2056    Cardiac Enzymes:  Recent Labs Lab 05/22/14 0453  TROPONINI <0.30   CBG:  Recent Labs Lab 05/25/14 0511 05/25/14 1217 05/25/14 1651 05/25/14 2022 05/26/14 0641  GLUCAP 99 122* 160* 99 96   Iron Studies: No results found for this basename: IRON, TIBC, TRANSFERRIN, FERRITIN,  in the last 72 hours @lablastinr3 @ Studies/Results: No results found. Medications:   . amiodarone  200 mg Oral Daily  . antiseptic oral rinse  7 mL Mouth Rinse BID  . calcium acetate  2,001 mg Oral TID WC  . cefUROXime  500 mg Oral Q supper  . Chlorhexidine Gluconate Cloth  6 each Topical Q0600  . darbepoetin (ARANESP) injection - DIALYSIS  100 mcg Intravenous Q  Wed-HD  . docusate sodium  100 mg Oral BID  . enoxaparin (LOVENOX) injection  30 mg Subcutaneous Q24H  . ezetimibe  10 mg Oral QHS  . feeding supplement (NEPRO CARB STEADY)  237 mL Oral Q24H  . feeding supplement (RESOURCE BREEZE)  1 Container Oral Q24H  . insulin aspart  0-9 Units Subcutaneous TID WC  . metoCLOPramide  5 mg Oral BID  . multivitamin  1 tablet Oral Q lunch  . mupirocin ointment  1 application Nasal BID  . rOPINIRole  1 mg Oral BID  . saccharomyces boulardii  250 mg Oral BID  . sodium chloride  3 mL Intravenous Q12H  . sodium chloride  3 mL Intravenous Q12H  . vancomycin  125 mg Oral QID

## 2014-05-26 NOTE — Discharge Summary (Signed)
Physician Discharge Summary  [name] ZCH:[id number] DOB: 19-Feb-1941 DOA: 05/21/2014  PCP: Gilford Rile, MD  Admit date: 05/21/2014 Discharge date: 05/26/2014  Time spent: 45 minutes  Recommendations for Outpatient Follow-up:  1. PCP Dr.Grisso 9/3 2. Stop Oral Vancomycin on 9/13  Discharge Diagnoses:  Principal Problem:   Acute respiratory failure with hypoxia Active Problems:   Pneumonia   Volume overload   ESRD on dialysis   Abdominal pain   Acute respiratory failure   Cdiff colitis   Anemia of chronic disease  Discharge Condition: stable, anxious to be discharged today for Family wedding tomorrow  Diet recommendation: Renal  Filed Weights   05/25/14 0434 05/26/14 0528 05/26/14 0953  Weight: 70.9 kg (156 lb 4.9 oz) 71.895 kg (158 lb 8 oz) 75.9 kg (167 lb 5.3 oz)    History of present illness:  [name] is a 73 y.o. female who was recently admitted to the hospital for diverticulitis, ESRD on HD presented with complaints of shortness of breath patient stated that she has been having productive cough for last one week. Patient had been short of breath even at rest.  Patient also reported  having abdominal pain with increasing nausea vomiting with loose stools over the last 10 days. In the ER chest x-ray showed features concerning for fluid overload and possible pneumonia.   Hospital Course:  Acute on chronic respiratory failure - on 3 L O2 at home for COPD  -due to combination of mild fluid overload with pneumonia/COPD.  -improving, STopped Vanc/Cefepime, Blood Cx negative  -changed to PO ceftin day 3/4 now -volume managed with HD  -back to 3L Home O2 and stable  COPD  -Continue nebulizer and Pulmicort.   Cdiff colitis  -started having diarrhea immediately on admission and tested positive for Cdiff -still with moderate diarrhea but anxious and adamant to be discharged due to family wedding tomorrow -continue PO Vanc QID for 2 weeks after completing PO Ceftin,  stop Date 9/13  ESRD on hemodialysis on Monday Wednesday and Friday - per Renal   Anemia of chronic disease  -aranesp with HD   DM  -resume glipizide   Consultations:  Renal  Discharge Exam: Filed Vitals:   05/26/14 1100  BP: 148/58  Pulse: 76  Temp:   Resp: 19    General: AAOx3 Cardiovascular: S1S2/RRR Respiratory: CTAB  Discharge Instructions You were cared for by a hospitalist during your hospital stay. If you have any questions about your discharge medications or the care you received while you were in the hospital after you are discharged, you can call the unit and asked to speak with the hospitalist on call if the hospitalist that took care of you is not available. Once you are discharged, your primary care physician will handle any further medical issues. Please note that NO REFILLS for any discharge medications will be authorized once you are discharged, as it is imperative that you return to your primary care physician (or establish a relationship with a primary care physician if you do not have one) for your aftercare needs so that they can reassess your need for medications and monitor your lab values.  Discharge Instructions   Discharge instructions    Complete by:  As directed   Renal Diet     Increase activity slowly    Complete by:  As directed             Medication List    STOP taking these medications  metroNIDAZOLE 500 MG tablet  Commonly known as:  FLAGYL      TAKE these medications       amiodarone 200 MG tablet  Commonly known as:  PACERONE  Take 200 mg by mouth daily.     calcium acetate 667 MG capsule  Commonly known as:  PHOSLO  Take 2,001 mg by mouth 3 (three) times daily with meals.     cefUROXime 500 MG tablet  Commonly known as:  CEFTIN  Take 1 tablet (500 mg total) by mouth daily with supper. For 1 day     dicyclomine 10 MG capsule  Commonly known as:  BENTYL  Take 10 mg by mouth daily as needed for spasms.      ezetimibe 10 MG tablet  Commonly known as:  ZETIA  Take 10 mg by mouth at bedtime.     fluconazole 100 MG tablet  Commonly known as:  DIFLUCAN  - Take 50-100 mg by mouth daily. Takes 100mg  after dialysis  - Takes 50mg  on non dialysis days until completed     glipiZIDE 10 MG 24 hr tablet  Commonly known as:  GLUCOTROL XL  Take 10 mg by mouth daily as needed (for FSBS >150).     HYDROcodone-acetaminophen 5-325 MG per tablet  Commonly known as:  NORCO/VICODIN  Take 0.5 tablets by mouth every 6 (six) hours as needed for moderate pain.     ipratropium 17 MCG/ACT inhaler  Commonly known as:  ATROVENT HFA  Inhale 2 puffs into the lungs every 6 (six) hours as needed for wheezing.     metoCLOPramide 5 MG tablet  Commonly known as:  REGLAN  Take 5 mg by mouth 2 (two) times daily.     multivitamin Tabs tablet  Take 1 tablet by mouth daily with lunch.     rOPINIRole 1 MG tablet  Commonly known as:  REQUIP  Take 1 mg by mouth 2 (two) times daily.     vancomycin 50 mg/mL oral solution  Commonly known as:  VANCOCIN  Take 2.5 mLs (125 mg total) by mouth 4 (four) times daily. For 16days. Please dispense 16day supply       Allergies  Allergen Reactions  . Lipitor [Atorvastatin] Other (See Comments)    Causes weakness and drowsiness  . Morphine And Related Nausea And Vomiting  . Nsaids Other (See Comments)    Unknown reported by previous hospital       Follow-up Information   Follow up with Gilford Rile, MD. Schedule an appointment as soon as possible for a visit on 06/01/2014. (@2 :45 pm spoke with Maudie Mercury )    Specialty:  Internal Medicine   Contact information:   [place] [address] [phone]        The results of significant diagnostics from this hospitalization (including imaging, microbiology, ancillary and laboratory) are listed below for reference.    Significant Diagnostic Studies: Ct Abdomen W Contrast  05/22/2014   CLINICAL DATA:  Abdominal pain.  Right nephrectomy for renal cell carcinoma 10 years ago. Appendectomy.  EXAM: CT ABDOMEN WITH CONTRAST  TECHNIQUE: Multidetector CT imaging of the abdomen was performed using the standard protocol following bolus administration of intravenous contrast.  CONTRAST:  135mL OMNIPAQUE IOHEXOL 300 MG/ML  SOLN  COMPARISON:  05/01/2014  FINDINGS: Lower Chest: Bibasilar atelectasis. Mild cardiomegaly with small bilateral pleural effusions.  Abdomen: Normal liver. Splenic size upper normal with subcentimeter nonspecific peripheral hypo attenuating foci within. These are of indeterminate acuity, but appear  new since the most recent contrast-enhanced study of 07/27/2012.  Normal stomach, pancreas. Gallbladder borderline distended, without pericholecystic edema or biliary ductal dilatation.  Normal right adrenal gland. Similar left adrenal thickening/nodularity.  Moderate left renal atrophy. Minimally complex lower pole left renal cyst (calcification dependently). Other left renal lesions which are consistent with cysts. Right nephrectomy, without locally recurrent disease.  Advanced aortic and branch vessel atherosclerosis. No retroperitoneal or retrocrural adenopathy.  Extensive colonic diverticulosis. Normal terminal ileum. Normal abdominal small bowel without ascites.  Bones/Musculoskeletal:  No acute osseous abnormality.  IMPRESSION: 1. Multiple low-density subcapsular foci within the spleen. These are indeterminate, but appear new since 07/27/2012. Differential considerations include small volume splenic infarcts versus interval development of lymphangiomas. 2. No other explanation for abdominal pain. 3. Borderline gallbladder distention, without specific evidence of acute cholecystitis. 4. Right nephrectomy without locally recurrent or metastatic disease. 5. Small bilateral pleural effusions. 6. Advanced atherosclerosis.   Electronically Signed   By: Abigail Miyamoto M.D.   On: 05/22/2014 08:17   Dg Chest Port 1  View  05/21/2014   CLINICAL DATA:  Difficulty breathing and cough  EXAM: PORTABLE CHEST - 1 VIEW  COMPARISON:  March 01, 2014  FINDINGS: There is cardiomegaly with pulmonary venous hypertension. There is generalized interstitial edema. There is a small area of infiltrate in the lateral right base. No adenopathy. There is atherosclerotic change in aorta.  IMPRESSION: Findings felt to represent chronic congestive heart failure. Superimposed infiltrate lateral right base.   Electronically Signed   By: Lowella Grip M.D.   On: 05/21/2014 21:25   Ir Shuntogram/ Fistulagram Right Mod Sed  05/23/2014   INDICATION: Decreased flows at dialysis.  EXAM: DIALYSIS AV SHUNTOGRAM/FISTULAGRAM  COMPARISON:  Right upper extremity native AV fistulagram -09/20/2012  MEDICATIONS: None.  CONTRAST:  50 mL OMNIPAQUE IOHEXOL 300 MG/ML  SOLN  ANESTHESIA/SEDATION: None  FLUOROSCOPY TIME:  30 seconds.  COMPLICATIONS: None immediate  PROCEDURE: The skin overlying the right upper arm native av fistula was prepped with Betadine in a sterile fashion, and a sterile drape was applied covering the operative field. A diagnostic shunt study was performed via an 18 gauge angiocatheter introduced into venous outflow. Venous drainage was assessed to the level of the central veins in the chest. Proximal shunt was studied by reflux maneuver with temporary compression of venous outflow.  The angiocath was removed and hemostasis was achieved with manual compression. A dressing was placed. The patient tolerated the procedure well without immediate post procedural complication.  FINDINGS: The native right upper arm brachiocephalic AV fistula is widely patent through the level of the cephalic arch. There is a very minimal collateralization of flow from the hypertrophied cephalic vein to the deep venous system of the right upper arm though these venous collaterals do not result in significant steal phenomena.  There is mild aneurysmal dilatation of the  arterial and venous stick sounds of the upper arm fistula without any apparent mural thrombus formation.  The central venous system of the right upper extremity is widely patent to the level of the right atrium.  Reflux shuntogram demonstrates wide patency of the arterial limb and anastomosis.  IMPRESSION: Normally functioning right upper arm brachiocephalic AV fistula without peripheral or central stenosis. No intervention performed.  ACCESS: This access remains amenable to future percutaneous interventions as clinically indicated.   Electronically Signed   By: Sandi Mariscal M.D.   On: 05/23/2014 16:54    Microbiology: Recent Results (from the past 240 hour(s))  CULTURE, BLOOD (  ROUTINE X 2)     Status: None   Collection Time    05/21/14  8:56 PM      Result Value Ref Range Status   Specimen Description BLOOD LEFT ARM   Final   Special Requests BOTTLES DRAWN AEROBIC ONLY 4CC   Final   Culture  Setup Time     Final   Value: 05/22/2014 01:48     Performed at [facility]   Culture     Final   Value:        BLOOD CULTURE RECEIVED NO GROWTH TO DATE CULTURE WILL BE HELD FOR 5 DAYS BEFORE ISSUING A FINAL NEGATIVE REPORT     Performed at [facility]   Report Status PENDING   Incomplete  CLOSTRIDIUM DIFFICILE BY PCR     Status: Abnormal   Collection Time    05/22/14  3:33 PM      Result Value Ref Range Status   C difficile by pcr POSITIVE (*) NEGATIVE Final   Comment: CRITICAL RESULT CALLED TO, READ BACK BY AND VERIFIED WITH:     R.COLUMBRES,RN AT 1815 BY L.PITT 05/22/14     Labs: Basic Metabolic Panel:  Recent Labs Lab 05/21/14 2056 05/22/14 0453 05/24/14 0515 05/26/14 0631  NA 133* 132* 134* 132*  K 4.7 4.8 4.4 5.0  CL 90* 90* 95* 93*  CO2 25 26 25  18*  GLUCOSE 95 237* 130* 80  BUN 31* 35* 46* 44*  CREATININE 6.61* 7.09* 6.14* 6.02*  CALCIUM 8.6 8.6 8.2* 8.5  PHOS  --   --  5.4* 4.3   Liver Function Tests:  Recent Labs Lab 05/21/14 2056 05/22/14 0453  05/24/14 0515 05/26/14 0631  AST 17 15  --   --   ALT 7 7  --   --   ALKPHOS 92 98  --   --   BILITOT 0.2* 0.3  --   --   PROT 6.1 5.9*  --   --   ALBUMIN 2.6* 2.4* 2.2* 2.1*    Recent Labs Lab 05/22/14 0453  LIPASE 12   No results found for this basename: AMMONIA,  in the last 168 hours CBC:  Recent Labs Lab 05/21/14 2056 05/22/14 0453 05/24/14 0515 05/25/14 0730 05/26/14 0631  WBC 14.9* 14.6* 15.4* 11.8* 12.5*  NEUTROABS  --  14.1*  --   --   --   HGB 9.7* 9.3* 8.3* 9.4* 9.4*  HCT 33.1* 31.5* 28.8* 32.7* 32.6*  MCV 97.9 97.2 98.3 99.1 100.0  PLT 250 263 240 223 216   Cardiac Enzymes:  Recent Labs Lab 05/22/14 0453  TROPONINI <0.30   BNP: BNP (last 3 results)  Recent Labs  08/27/13 2220 04/19/14 0410  PROBNP 14565.0* 30133.0*   CBG:  Recent Labs Lab 05/25/14 0511 05/25/14 1217 05/25/14 1651 05/25/14 2022 05/26/14 0641  GLUCAP 99 122* 160* 99 96       Signed:  Nachman Sundt  [facility] 05/26/2014, 11:25 AM

## 2014-05-27 ENCOUNTER — Emergency Department (HOSPITAL_COMMUNITY)
Admission: EM | Admit: 2014-05-27 | Discharge: 2014-05-27 | Disposition: A | Discharge status: Home / Self Care | Payer: Medicare Other | Attending: Emergency Medicine | Admitting: Emergency Medicine

## 2014-05-27 ENCOUNTER — Encounter (HOSPITAL_COMMUNITY): Payer: Self-pay | Admitting: Emergency Medicine

## 2014-05-27 ENCOUNTER — Emergency Department (HOSPITAL_COMMUNITY): Payer: Medicare Other

## 2014-05-27 DIAGNOSIS — Z9981 Dependence on supplemental oxygen: Secondary | ICD-10-CM | POA: Diagnosis not present

## 2014-05-27 DIAGNOSIS — Z862 Personal history of diseases of the blood and blood-forming organs and certain disorders involving the immune mechanism: Secondary | ICD-10-CM | POA: Insufficient documentation

## 2014-05-27 DIAGNOSIS — E7800 Pure hypercholesterolemia, unspecified: Secondary | ICD-10-CM | POA: Diagnosis not present

## 2014-05-27 DIAGNOSIS — Z85528 Personal history of other malignant neoplasm of kidney: Secondary | ICD-10-CM | POA: Diagnosis not present

## 2014-05-27 DIAGNOSIS — Z79899 Other long term (current) drug therapy: Secondary | ICD-10-CM | POA: Diagnosis not present

## 2014-05-27 DIAGNOSIS — Z9861 Coronary angioplasty status: Secondary | ICD-10-CM | POA: Insufficient documentation

## 2014-05-27 DIAGNOSIS — I252 Old myocardial infarction: Secondary | ICD-10-CM | POA: Diagnosis not present

## 2014-05-27 DIAGNOSIS — I509 Heart failure, unspecified: Secondary | ICD-10-CM | POA: Insufficient documentation

## 2014-05-27 DIAGNOSIS — Z8701 Personal history of pneumonia (recurrent): Secondary | ICD-10-CM | POA: Diagnosis not present

## 2014-05-27 DIAGNOSIS — I2789 Other specified pulmonary heart diseases: Secondary | ICD-10-CM | POA: Diagnosis not present

## 2014-05-27 DIAGNOSIS — R197 Diarrhea, unspecified: Secondary | ICD-10-CM | POA: Diagnosis present

## 2014-05-27 DIAGNOSIS — I120 Hypertensive chronic kidney disease with stage 5 chronic kidney disease or end stage renal disease: Secondary | ICD-10-CM | POA: Diagnosis not present

## 2014-05-27 DIAGNOSIS — K219 Gastro-esophageal reflux disease without esophagitis: Secondary | ICD-10-CM | POA: Diagnosis not present

## 2014-05-27 DIAGNOSIS — G4730 Sleep apnea, unspecified: Secondary | ICD-10-CM | POA: Diagnosis not present

## 2014-05-27 DIAGNOSIS — Z8669 Personal history of other diseases of the nervous system and sense organs: Secondary | ICD-10-CM | POA: Diagnosis not present

## 2014-05-27 DIAGNOSIS — J441 Chronic obstructive pulmonary disease with (acute) exacerbation: Secondary | ICD-10-CM | POA: Diagnosis not present

## 2014-05-27 DIAGNOSIS — R1110 Vomiting, unspecified: Secondary | ICD-10-CM | POA: Diagnosis not present

## 2014-05-27 DIAGNOSIS — Z87891 Personal history of nicotine dependence: Secondary | ICD-10-CM | POA: Diagnosis not present

## 2014-05-27 DIAGNOSIS — E119 Type 2 diabetes mellitus without complications: Secondary | ICD-10-CM | POA: Diagnosis not present

## 2014-05-27 DIAGNOSIS — Z8619 Personal history of other infectious and parasitic diseases: Secondary | ICD-10-CM | POA: Insufficient documentation

## 2014-05-27 DIAGNOSIS — I2510 Atherosclerotic heart disease of native coronary artery without angina pectoris: Secondary | ICD-10-CM | POA: Insufficient documentation

## 2014-05-27 DIAGNOSIS — R109 Unspecified abdominal pain: Secondary | ICD-10-CM | POA: Diagnosis not present

## 2014-05-27 DIAGNOSIS — N186 End stage renal disease: Secondary | ICD-10-CM | POA: Insufficient documentation

## 2014-05-27 DIAGNOSIS — Z992 Dependence on renal dialysis: Secondary | ICD-10-CM | POA: Diagnosis not present

## 2014-05-27 LAB — COMPREHENSIVE METABOLIC PANEL
ALK PHOS: 96 U/L (ref 39–117)
ALT: 12 U/L (ref 0–35)
ANION GAP: 13 (ref 5–15)
AST: 19 U/L (ref 0–37)
Albumin: 2.5 g/dL — ABNORMAL LOW (ref 3.5–5.2)
BUN: 21 mg/dL (ref 6–23)
CO2: 28 mEq/L (ref 19–32)
Calcium: 9.1 mg/dL (ref 8.4–10.5)
Chloride: 96 mEq/L (ref 96–112)
Creatinine, Ser: 3.91 mg/dL — ABNORMAL HIGH (ref 0.50–1.10)
GFR calc non Af Amer: 11 mL/min — ABNORMAL LOW (ref >90)
GFR, EST AFRICAN AMERICAN: 12 mL/min — AB (ref >90)
GLUCOSE: 108 mg/dL — AB (ref 70–99)
Potassium: 4 mEq/L (ref 3.7–5.3)
Sodium: 137 mEq/L (ref 137–147)
Total Bilirubin: 0.2 mg/dL — ABNORMAL LOW (ref 0.3–1.2)
Total Protein: 6.4 g/dL (ref 6.0–8.3)

## 2014-05-27 LAB — CBC WITH DIFFERENTIAL/PLATELET
BASOS PCT: 0 % (ref 0–1)
Basophils Absolute: 0 10*3/uL (ref 0.0–0.1)
EOS PCT: 0 % (ref 0–5)
Eosinophils Absolute: 0.1 10*3/uL (ref 0.0–0.7)
HEMATOCRIT: 42.1 % (ref 36.0–46.0)
HEMOGLOBIN: 12.3 g/dL (ref 12.0–15.0)
LYMPHS ABS: 1.5 10*3/uL (ref 0.7–4.0)
Lymphocytes Relative: 10 % — ABNORMAL LOW (ref 12–46)
MCH: 28.7 pg (ref 26.0–34.0)
MCHC: 29.2 g/dL — ABNORMAL LOW (ref 30.0–36.0)
MCV: 98.1 fL (ref 78.0–100.0)
MONOS PCT: 5 % (ref 3–12)
Monocytes Absolute: 0.7 10*3/uL (ref 0.1–1.0)
Neutro Abs: 12 10*3/uL — ABNORMAL HIGH (ref 1.7–7.7)
Neutrophils Relative %: 84 % — ABNORMAL HIGH (ref 43–77)
Platelets: 213 10*3/uL (ref 150–400)
RBC: 4.29 MIL/uL (ref 3.87–5.11)
RDW: 16.8 % — ABNORMAL HIGH (ref 11.5–15.5)
WBC: 14.2 10*3/uL — ABNORMAL HIGH (ref 4.0–10.5)

## 2014-05-27 LAB — LACTIC ACID, PLASMA: LACTIC ACID, VENOUS: 0.9 mmol/L (ref 0.5–2.2)

## 2014-05-27 LAB — LIPASE, BLOOD: Lipase: 18 U/L (ref 11–59)

## 2014-05-27 MED ORDER — ONDANSETRON 4 MG PO TBDP: 4.0000 mg | ORAL_TABLET | Freq: Three times a day (TID) | ORAL | Status: AC | PRN

## 2014-05-27 MED ORDER — MORPHINE SULFATE 2 MG/ML IJ SOLN: 2.0000 mg | Freq: Once | INTRAMUSCULAR | Status: DC

## 2014-05-27 MED ORDER — ACETAMINOPHEN 500 MG PO TABS
1000.0000 mg | ORAL_TABLET | Freq: Once | ORAL | Status: AC
Start: 2014-05-27 — End: 2014-05-27
  Administered 2014-05-27: 1000 mg via ORAL
  Filled 2014-05-27: qty 2

## 2014-05-27 MED ORDER — ONDANSETRON 4 MG PO TBDP
4.0000 mg | ORAL_TABLET | Freq: Once | ORAL | Status: AC
Start: 2014-05-27 — End: 2014-05-27
  Administered 2014-05-27: 4 mg via ORAL
  Filled 2014-05-27: qty 1

## 2014-05-27 MED ORDER — ONDANSETRON HCL 4 MG/2ML IJ SOLN: 4.0000 mg | Freq: Once | INTRAMUSCULAR | Status: DC

## 2014-05-27 NOTE — ED Notes (Signed)
Patient had c-diff during her hospital stay and has been being treated for it while in the hospital.

## 2014-05-27 NOTE — ED Provider Notes (Signed)
CSN: [id number]     Arrival date & time 05/27/14  0037 History   First MD Initiated Contact with Patient 05/27/14 0245     Chief Complaint  Patient presents with  . Emesis  . Diarrhea     (Consider location/radiation/quality/duration/timing/severity/associated sxs/prior Treatment) HPI  [name] is a 73 y.o. female with a past medical history of COPD, end-stage renal disease presenting today with abdominal pain vomiting and diarrhea. Patient was just discharged earlier today for the same. She was admitted and found to be in fluid overload, pneumonia, and also have an abdominal infection. Patient was also treated for C. difficile during her hospital course. She was sent home with oral vancomycin and Ceftin. After patient took her oral antibiotic today, she had onset of severe abdominal pain with vomiting 4 times. She's also been having episodes of diarrhea. She states this feels similar to her prior admission. She denies any fevers at home. There's been no chest pain or shortness of breath. Her abdominal pain is diffuse and constant. She denies having fluid overload currently.  10 Systems reviewed and are negative for acute change except as noted in the HPI.    Past Medical History  Diagnosis Date  . Carotid artery occlusion     bilat CEA in 2012  . C. 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 Hypertension   . Atrial fibrillation April  2013  . Pulmonary hypertension   . Secondary hyperparathyroidism, renal   . COPD (chronic obstructive pulmonary disease)   . Hypercholesterolemia   . History of MI (myocardial infarction)     "they say I had 2 years ago" (08/20/2012)  . GERD (gastroesophageal reflux disease)   . History of pneumonia     "have it alot" (08/20/2012)  . Sleep apnea     "don't wear mask anymore"  . Type II diabetes mellitus   . History of blood transfusion 2013    "3 times so far in 2013:  4 pints one time; 2 pints another; ?# last time"  (08/20/2012)  . Iron deficiency anemia   . Seizures 2012    after carotid surgery  . ESRD on dialysis     "[facility]; Tues, Thurs; Sat"  . On home oxygen therapy     "24/7"   . Acute diverticulitis     Oct 2013, treated medically [facility], sigmoid diverticulitits  . CHF (congestive heart failure)   . Coronary artery disease   . Seizure disorder March 2011  . Myocardial infarction   . Renal cell carcinoma 2005?  Marland Kitchen Cancer of kidney 2005   Past Surgical History  Procedure Laterality Date  . Nephrectomy  2005    Right, for Renal cell carcinoma  . Carotid endarterectomy  2012    bilaterally; Dr. Kellie Simmering  . Av fistula placement  Jan. 7, 2011    Right  upper arm by Dr. Kellie Simmering  . Hd catheter  Aug. 22, 2013    [facility]  . Appendectomy      childhood  . Abdominal hysterectomy  1971?  Marland Kitchen Coronary angioplasty with stent placement  1990's    "1 total"  . Coronary angioplasty  1990's  . Av fistula repair  2013    right upper arm  . Esophagogastroduodenoscopy N/A 12/29/2012    Procedure: ESOPHAGOGASTRODUODENOSCOPY (EGD);  Surgeon: Jeryl Columbia, MD;  Location: [facility] ENDOSCOPY;  Service: Endoscopy;  Laterality: N/A;  . Colonoscopy with propofol N/A 12/31/2012  Procedure: COLONOSCOPY WITH PROPOFOL;  Surgeon: Jeryl Columbia, MD;  Location: [facility] ENDOSCOPY;  Service: Endoscopy;  Laterality: N/A;  currently IP at [facility] History  Problem Relation Age of Onset  . Cancer Mother     pancreatic  . Diabetes Mother   . Stroke Father   . Coronary artery disease Father   . Heart disease Father 63    Heart Disease before age 35  . Hyperlipidemia Father   . Hypertension Father   . Heart attack Father   . Cancer Brother     Brain  . Kidney disease Brother     Kidney stones  . Emphysema Father     smoked  . Pancreatic cancer Mother    History  Substance Use Topics  . Smoking status: Former Smoker -- 1.50 packs/day for 45 years    Types: Cigarettes    Quit date: 09/30/2003   . Smokeless tobacco: Never Used  . Alcohol Use: No   OB History   Grav Para Term Preterm Abortions TAB SAB Ect Mult Living                 Review of Systems    Allergies  Lipitor; Morphine and related; and Nsaids  Home Medications   Prior to Admission medications   Medication Sig Start Date End Date Taking? Authorizing Provider  amiodarone (PACERONE) 200 MG tablet Take 200 mg by mouth daily. 04/29/14  Yes Historical Provider, MD  calcium acetate (PHOSLO) 667 MG capsule Take 2,001 mg by mouth 3 (three) times daily with meals.    Yes Historical Provider, MD  cefUROXime (CEFTIN) 500 MG tablet Take 1 tablet (500 mg total) by mouth daily with supper. For 1 day 05/26/14  Yes Domenic Polite, MD  dicyclomine (BENTYL) 10 MG capsule Take 10 mg by mouth daily as needed for spasms.   Yes Historical Provider, MD  ezetimibe (ZETIA) 10 MG tablet Take 10 mg by mouth at bedtime.   Yes Historical Provider, MD  fluconazole (DIFLUCAN) 100 MG tablet Take 50-100 mg by mouth daily. Takes 100mg  after dialysis Takes 50mg  on non dialysis days until completed   Yes Historical Provider, MD  glipiZIDE (GLUCOTROL XL) 10 MG 24 hr tablet Take 10 mg by mouth daily as needed (for FSBS >150).   Yes Historical Provider, MD  HYDROcodone-acetaminophen (NORCO/VICODIN) 5-325 MG per tablet Take 0.5 tablets by mouth every 6 (six) hours as needed for moderate pain.   Yes Historical Provider, MD  ipratropium (ATROVENT HFA) 17 MCG/ACT inhaler Inhale 2 puffs into the lungs every 6 (six) hours as needed for wheezing.   Yes Historical Provider, MD  metoCLOPramide (REGLAN) 5 MG tablet Take 5 mg by mouth 2 (two) times daily.   Yes Historical Provider, MD  multivitamin (RENA-VIT) TABS tablet Take 1 tablet by mouth daily with lunch.   Yes Historical Provider, MD  rOPINIRole (REQUIP) 1 MG tablet Take 1 mg by mouth 2 (two) times daily.   Yes Historical Provider, MD  vancomycin (VANCOCIN) 125 MG capsule Take 1 capsule (125 mg total) by  mouth 4 (four) times daily. For 16days 05/26/14  Yes Domenic Polite, MD  ondansetron (ZOFRAN-ODT) 4 MG disintegrating tablet Take 1 tablet (4 mg total) by mouth every 8 (eight) hours as needed for nausea or vomiting. 05/27/14   Everlene Balls, MD   BP 136/44  Pulse 81  Temp(Src) 98.3 F (36.8 C) (Oral)  Resp 18  Ht 5\' 4"  (1.626 m)  Wt 152  lb (68.947 kg)  BMI 26.08 kg/m2  SpO2 99% Physical Exam  Nursing note and vitals reviewed. Constitutional: She is oriented to person, place, and time. She appears well-developed and well-nourished. No distress.  HENT:  Head: Normocephalic and atraumatic.  Nose: Nose normal.  Mouth/Throat: Oropharynx is clear and moist. No oropharyngeal exudate.  Eyes: Conjunctivae and EOM are normal. Pupils are equal, round, and reactive to light. No scleral icterus.  Neck: Normal range of motion. Neck supple. No JVD present. No tracheal deviation present. No thyromegaly present.  Cardiovascular: Normal rate, regular rhythm and normal heart sounds.  Exam reveals no gallop and no friction rub.   No murmur heard. Pulmonary/Chest: Effort normal. No respiratory distress. She has wheezes. She exhibits no tenderness.  Intermittent wheezes heard in the posterior fields. Prolonged expiratory phase noted.  Abdominal: Soft. Bowel sounds are normal. She exhibits no distension and no mass. There is no tenderness. There is no rebound and no guarding.  Musculoskeletal: Normal range of motion. She exhibits no edema and no tenderness.  Right upper extremity AV fistula with palpable thrill  Lymphadenopathy:    She has no cervical adenopathy.  Neurological: She is alert and oriented to person, place, and time.  Skin: Skin is warm and dry. No rash noted. She is not diaphoretic. No erythema. No pallor.    ED Course  Procedures (including critical care time) Labs Review Labs Reviewed  CBC WITH DIFFERENTIAL - Abnormal; Notable for the following:    WBC 14.2 (*)    MCHC 29.2 (*)    RDW  16.8 (*)    Neutrophils Relative % 84 (*)    Neutro Abs 12.0 (*)    Lymphocytes Relative 10 (*)    All other components within normal limits  COMPREHENSIVE METABOLIC PANEL - Abnormal; Notable for the following:    Glucose, Bld 108 (*)    Creatinine, Ser 3.91 (*)    Albumin 2.5 (*)    Total Bilirubin 0.2 (*)    GFR calc non Af Amer 11 (*)    GFR calc Af Amer 12 (*)    All other components within normal limits  LACTIC ACID, PLASMA  LIPASE, BLOOD    Imaging Review Dg Chest 2 View  05/27/2014   CLINICAL DATA:  Multiple emesis, diarrhea, generalized weakness and fatigue today. Hemodialysis yesterday. Recent hospital discharge for pneumonia.  EXAM: CHEST  2 VIEW  COMPARISON:  05/21/2014  FINDINGS: Cardiac enlargement with pulmonary vascular congestion and mild perihilar edema. Findings are improving since previous study. No pleural effusions. No pneumothorax. No focal consolidation. Calcification of the aorta.  IMPRESSION: Improving congestive changes since previous study.   Electronically Signed   By: Lucienne Capers M.D.   On: 05/27/2014 02:42     EKG Interpretation None      MDM   Final diagnoses:  Abdominal pain, unspecified abdominal location    Patient presents today for onset of abdominal pain vomiting and diarrhea. Her laboratory values are all at their baseline. Patient's white count was slightly elevated at 14, however her lab values always reveal an elevated white blood cell count. In the setting of her recent infections this is not significant for any worsening infection. Patient was given nausea and pain medication the emergency department her symptoms improved. She is not able to tolerate by mouth, which she was not able to at home. Patient's oxygen saturation was low at triage but responded to oxygen that she wears at home for her COPD. Patient got a  dialysis treatment earlier today during admission as well. Patient was observed in the emergency department for 6 hours  without any deterioration in her clinical status. Her vital signs remain at her baseline. She was given abdominal pain discharge instructions. Patient safe for discharge.  Everlene Balls, MD 05/27/14 [phone]

## 2014-05-27 NOTE — ED Notes (Signed)
MD at bedside. 

## 2014-05-27 NOTE — ED Notes (Signed)
Patient transported to X-ray 

## 2014-05-27 NOTE — ED Notes (Signed)
Patient discharged with all personal belongings. 

## 2014-05-27 NOTE — Discharge Instructions (Signed)
Abdominal Pain Ms. Spira, you were seen today for abdominal pain.  Your labs are at their baseline.  Your white blood cell count is a little high at 14.  This can be a marker of infection.  Be sure to follow up with your regular doctor within 2 days to have this re-checked.  You can take zofran at home for the nausea and continue on your antibiotics.  For any worsening or any new symptoms, return to the ED immediately for repeat evaluation.  Thank you. Many things can cause belly (abdominal) pain. Most times, the belly pain is not dangerous. Many cases of belly pain can be watched and treated at home. HOME CARE   Do not take medicines that help you go poop (laxatives) unless told to by your doctor.  Only take medicine as told by your doctor.  Eat or drink as told by your doctor. Your doctor will tell you if you should be on a special diet. GET HELP IF:  You do not know what is causing your belly pain.  You have belly pain while you are sick to your stomach (nauseous) or have runny poop (diarrhea).  You have pain while you pee or poop.  Your belly pain wakes you up at night.  You have belly pain that gets worse or better when you eat.  You have belly pain that gets worse when you eat fatty foods.  You have a fever. GET HELP RIGHT AWAY IF:   The pain does not go away within 2 hours.  You keep throwing up (vomiting).  The pain changes and is only in the right or left part of the belly.  You have bloody or tarry looking poop. MAKE SURE YOU:   Understand these instructions.  Will watch your condition.  Will get help right away if you are not doing well or get worse. Document Released: 03/03/2008 Document Revised: 09/20/2013 Document Reviewed: 05/25/2013 [facility] [employer] Patient Information 2015 [place], [place]. This information is not intended to replace advice given to you by your health care provider. Make sure you discuss any questions you have with your health care provider.

## 2014-05-27 NOTE — ED Notes (Addendum)
Unable to obtain IV access or blood draw. Another RN to attempt. EDP made aware.

## 2014-05-27 NOTE — ED Notes (Signed)
Pt. reports multiple emesis , diarrhea , generalized weakness/fatigue onset today , hemodialysis yesterday and was just discharged from hospital yesterday Dx: Pneumonia. O2 sat low at triage = 82% 3 lpm/[facility] / hypotensive 93/73.

## 2014-05-28 LAB — CULTURE, BLOOD (ROUTINE X 2)
Culture: NO GROWTH
Culture: NO GROWTH

## 2014-06-06 ENCOUNTER — Encounter (HOSPITAL_COMMUNITY): Payer: Self-pay | Admitting: Emergency Medicine

## 2014-06-06 ENCOUNTER — Emergency Department (HOSPITAL_COMMUNITY): Payer: Medicare Other

## 2014-06-06 ENCOUNTER — Inpatient Hospital Stay (HOSPITAL_COMMUNITY)
Admission: EM | Admit: 2014-06-06 | Discharge: 2014-06-13 | DRG: 698 | Disposition: A | Discharge status: Home / Self Care | Payer: Medicare Other | Attending: Internal Medicine | Admitting: Internal Medicine

## 2014-06-06 ENCOUNTER — Inpatient Hospital Stay (HOSPITAL_COMMUNITY): Payer: Medicare Other

## 2014-06-06 DIAGNOSIS — N2581 Secondary hyperparathyroidism of renal origin: Secondary | ICD-10-CM | POA: Diagnosis present

## 2014-06-06 DIAGNOSIS — N186 End stage renal disease: Secondary | ICD-10-CM | POA: Diagnosis present

## 2014-06-06 DIAGNOSIS — J9620 Acute and chronic respiratory failure, unspecified whether with hypoxia or hypercapnia: Secondary | ICD-10-CM | POA: Diagnosis present

## 2014-06-06 DIAGNOSIS — R0602 Shortness of breath: Secondary | ICD-10-CM | POA: Diagnosis present

## 2014-06-06 DIAGNOSIS — G4733 Obstructive sleep apnea (adult) (pediatric): Secondary | ICD-10-CM | POA: Diagnosis present

## 2014-06-06 DIAGNOSIS — K5732 Diverticulitis of large intestine without perforation or abscess without bleeding: Secondary | ICD-10-CM | POA: Diagnosis present

## 2014-06-06 DIAGNOSIS — Z9981 Dependence on supplemental oxygen: Secondary | ICD-10-CM | POA: Diagnosis not present

## 2014-06-06 DIAGNOSIS — J4489 Other specified chronic obstructive pulmonary disease: Secondary | ICD-10-CM | POA: Diagnosis present

## 2014-06-06 DIAGNOSIS — E7800 Pure hypercholesterolemia, unspecified: Secondary | ICD-10-CM | POA: Diagnosis present

## 2014-06-06 DIAGNOSIS — I272 Pulmonary hypertension, unspecified: Secondary | ICD-10-CM

## 2014-06-06 DIAGNOSIS — A481 Legionnaires' disease: Secondary | ICD-10-CM

## 2014-06-06 DIAGNOSIS — I252 Old myocardial infarction: Secondary | ICD-10-CM

## 2014-06-06 DIAGNOSIS — I2510 Atherosclerotic heart disease of native coronary artery without angina pectoris: Secondary | ICD-10-CM | POA: Diagnosis present

## 2014-06-06 DIAGNOSIS — E8779 Other fluid overload: Secondary | ICD-10-CM | POA: Diagnosis not present

## 2014-06-06 DIAGNOSIS — J9611 Chronic respiratory failure with hypoxia: Secondary | ICD-10-CM | POA: Diagnosis present

## 2014-06-06 DIAGNOSIS — A0472 Enterocolitis due to Clostridium difficile, not specified as recurrent: Secondary | ICD-10-CM | POA: Diagnosis present

## 2014-06-06 DIAGNOSIS — E440 Moderate protein-calorie malnutrition: Secondary | ICD-10-CM | POA: Diagnosis present

## 2014-06-06 DIAGNOSIS — Z79899 Other long term (current) drug therapy: Secondary | ICD-10-CM | POA: Diagnosis not present

## 2014-06-06 DIAGNOSIS — I4891 Unspecified atrial fibrillation: Secondary | ICD-10-CM | POA: Diagnosis present

## 2014-06-06 DIAGNOSIS — Z85528 Personal history of other malignant neoplasm of kidney: Secondary | ICD-10-CM

## 2014-06-06 DIAGNOSIS — E119 Type 2 diabetes mellitus without complications: Secondary | ICD-10-CM | POA: Diagnosis present

## 2014-06-06 DIAGNOSIS — N321 Vesicointestinal fistula: Secondary | ICD-10-CM | POA: Diagnosis present

## 2014-06-06 DIAGNOSIS — Z87891 Personal history of nicotine dependence: Secondary | ICD-10-CM

## 2014-06-06 DIAGNOSIS — I10 Essential (primary) hypertension: Secondary | ICD-10-CM | POA: Diagnosis present

## 2014-06-06 DIAGNOSIS — R197 Diarrhea, unspecified: Secondary | ICD-10-CM | POA: Diagnosis present

## 2014-06-06 DIAGNOSIS — I480 Paroxysmal atrial fibrillation: Secondary | ICD-10-CM

## 2014-06-06 DIAGNOSIS — I120 Hypertensive chronic kidney disease with stage 5 chronic kidney disease or end stage renal disease: Secondary | ICD-10-CM | POA: Diagnosis present

## 2014-06-06 DIAGNOSIS — J189 Pneumonia, unspecified organism: Secondary | ICD-10-CM | POA: Diagnosis present

## 2014-06-06 DIAGNOSIS — Z992 Dependence on renal dialysis: Secondary | ICD-10-CM | POA: Diagnosis not present

## 2014-06-06 DIAGNOSIS — I482 Chronic atrial fibrillation, unspecified: Secondary | ICD-10-CM

## 2014-06-06 DIAGNOSIS — N823 Fistula of vagina to large intestine: Secondary | ICD-10-CM

## 2014-06-06 DIAGNOSIS — I6529 Occlusion and stenosis of unspecified carotid artery: Secondary | ICD-10-CM

## 2014-06-06 DIAGNOSIS — D649 Anemia, unspecified: Secondary | ICD-10-CM | POA: Diagnosis present

## 2014-06-06 DIAGNOSIS — N189 Chronic kidney disease, unspecified: Secondary | ICD-10-CM

## 2014-06-06 DIAGNOSIS — J449 Chronic obstructive pulmonary disease, unspecified: Secondary | ICD-10-CM | POA: Diagnosis present

## 2014-06-06 DIAGNOSIS — D631 Anemia in chronic kidney disease: Secondary | ICD-10-CM

## 2014-06-06 DIAGNOSIS — I2789 Other specified pulmonary heart diseases: Secondary | ICD-10-CM | POA: Diagnosis present

## 2014-06-06 DIAGNOSIS — J9601 Acute respiratory failure with hypoxia: Secondary | ICD-10-CM

## 2014-06-06 DIAGNOSIS — R109 Unspecified abdominal pain: Secondary | ICD-10-CM | POA: Diagnosis present

## 2014-06-06 DIAGNOSIS — R001 Bradycardia, unspecified: Secondary | ICD-10-CM

## 2014-06-06 DIAGNOSIS — Z9861 Coronary angioplasty status: Secondary | ICD-10-CM | POA: Diagnosis not present

## 2014-06-06 HISTORY — DX: Disorder of kidney and ureter, unspecified: N28.9

## 2014-06-06 LAB — PRO B NATRIURETIC PEPTIDE: Pro B Natriuretic peptide (BNP): >70000 pg/mL — ABNORMAL HIGH (ref 0–125)

## 2014-06-06 LAB — CBC
HCT: 30.4 % — ABNORMAL LOW (ref 36.0–46.0)
HEMOGLOBIN: 8.6 g/dL — AB (ref 12.0–15.0)
MCH: 27.9 pg (ref 26.0–34.0)
MCHC: 28.3 g/dL — AB (ref 30.0–36.0)
MCV: 98.7 fL (ref 78.0–100.0)
PLATELETS: 212 10*3/uL (ref 150–400)
RBC: 3.08 MIL/uL — AB (ref 3.87–5.11)
RDW: 17.7 % — ABNORMAL HIGH (ref 11.5–15.5)
WBC: 13.3 10*3/uL — ABNORMAL HIGH (ref 4.0–10.5)

## 2014-06-06 LAB — I-STAT TROPONIN, ED: Troponin i, poc: 0.08 ng/mL (ref 0.00–0.08)

## 2014-06-06 LAB — CBC WITH DIFFERENTIAL/PLATELET
BASOS ABS: 0.1 10*3/uL (ref 0.0–0.1)
Basophils Relative: 0 % (ref 0–1)
EOS PCT: 3 % (ref 0–5)
Eosinophils Absolute: 0.3 10*3/uL (ref 0.0–0.7)
HCT: 30.6 % — ABNORMAL LOW (ref 36.0–46.0)
HEMOGLOBIN: 8.7 g/dL — AB (ref 12.0–15.0)
LYMPHS PCT: 8 % — AB (ref 12–46)
Lymphs Abs: 1 10*3/uL (ref 0.7–4.0)
MCH: 27.9 pg (ref 26.0–34.0)
MCHC: 28.4 g/dL — ABNORMAL LOW (ref 30.0–36.0)
MCV: 98.1 fL (ref 78.0–100.0)
MONOS PCT: 6 % (ref 3–12)
Monocytes Absolute: 0.8 10*3/uL (ref 0.1–1.0)
Neutro Abs: 10.7 10*3/uL — ABNORMAL HIGH (ref 1.7–7.7)
Neutrophils Relative %: 83 % — ABNORMAL HIGH (ref 43–77)
PLATELETS: 210 10*3/uL (ref 150–400)
RBC: 3.12 MIL/uL — AB (ref 3.87–5.11)
RDW: 17.8 % — ABNORMAL HIGH (ref 11.5–15.5)
WBC: 12.9 10*3/uL — ABNORMAL HIGH (ref 4.0–10.5)

## 2014-06-06 LAB — URINALYSIS, ROUTINE W REFLEX MICROSCOPIC
BILIRUBIN URINE: NEGATIVE
Glucose, UA: 100 mg/dL — AB
KETONES UR: NEGATIVE mg/dL
Leukocytes, UA: NEGATIVE
NITRITE: NEGATIVE
Specific Gravity, Urine: 1.012 (ref 1.005–1.030)
Urobilinogen, UA: 0.2 mg/dL (ref 0.0–1.0)
pH: 7 (ref 5.0–8.0)

## 2014-06-06 LAB — I-STAT CHEM 8, ED
BUN: 27 mg/dL — ABNORMAL HIGH (ref 6–23)
CALCIUM ION: 1.01 mmol/L — AB (ref 1.13–1.30)
CHLORIDE: 97 meq/L (ref 96–112)
Creatinine, Ser: 5.2 mg/dL — ABNORMAL HIGH (ref 0.50–1.10)
Glucose, Bld: 103 mg/dL — ABNORMAL HIGH (ref 70–99)
HEMATOCRIT: 30 % — AB (ref 36.0–46.0)
Hemoglobin: 10.2 g/dL — ABNORMAL LOW (ref 12.0–15.0)
Potassium: 4.7 mEq/L (ref 3.7–5.3)
Sodium: 132 mEq/L — ABNORMAL LOW (ref 137–147)
TCO2: 31 mmol/L (ref 0–100)

## 2014-06-06 LAB — I-STAT CG4 LACTIC ACID, ED: Lactic Acid, Venous: 0.63 mmol/L (ref 0.5–2.2)

## 2014-06-06 LAB — COMPREHENSIVE METABOLIC PANEL
ALBUMIN: 2.3 g/dL — AB (ref 3.5–5.2)
ALK PHOS: 95 U/L (ref 39–117)
ALT: 11 U/L (ref 0–35)
AST: 15 U/L (ref 0–37)
Anion gap: 13 (ref 5–15)
BILIRUBIN TOTAL: 0.3 mg/dL (ref 0.3–1.2)
BUN: 28 mg/dL — AB (ref 6–23)
CHLORIDE: 93 meq/L — AB (ref 96–112)
CO2: 29 meq/L (ref 19–32)
CREATININE: 5.07 mg/dL — AB (ref 0.50–1.10)
Calcium: 8.3 mg/dL — ABNORMAL LOW (ref 8.4–10.5)
GFR calc Af Amer: 9 mL/min — ABNORMAL LOW (ref >90)
GFR, EST NON AFRICAN AMERICAN: 8 mL/min — AB (ref >90)
Glucose, Bld: 101 mg/dL — ABNORMAL HIGH (ref 70–99)
POTASSIUM: 5 meq/L (ref 3.7–5.3)
Sodium: 135 mEq/L — ABNORMAL LOW (ref 137–147)
Total Protein: 5.8 g/dL — ABNORMAL LOW (ref 6.0–8.3)

## 2014-06-06 LAB — URINE MICROSCOPIC-ADD ON

## 2014-06-06 LAB — TSH: TSH: 1.91 u[IU]/mL (ref 0.350–4.500)

## 2014-06-06 LAB — TROPONIN I

## 2014-06-06 LAB — CREATININE, SERUM
Creatinine, Ser: 5.21 mg/dL — ABNORMAL HIGH (ref 0.50–1.10)
GFR calc Af Amer: 9 mL/min — ABNORMAL LOW (ref >90)
GFR calc non Af Amer: 7 mL/min — ABNORMAL LOW (ref >90)

## 2014-06-06 LAB — GLUCOSE, CAPILLARY: Glucose-Capillary: 75 mg/dL (ref 70–99)

## 2014-06-06 LAB — MRSA PCR SCREENING: MRSA by PCR: NEGATIVE

## 2014-06-06 MED ORDER — ONDANSETRON HCL 4 MG PO TABS: 4.0000 mg | ORAL_TABLET | Freq: Four times a day (QID) | ORAL | Status: DC | PRN

## 2014-06-06 MED ORDER — LEVALBUTEROL HCL 0.63 MG/3ML IN NEBU
0.6300 mg | INHALATION_SOLUTION | Freq: Four times a day (QID) | RESPIRATORY_TRACT | Status: DC
  Administered 2014-06-06 – 2014-06-08 (×6): 0.63 mg via RESPIRATORY_TRACT
  Filled 2014-06-06 (×14): qty 3

## 2014-06-06 MED ORDER — FLUCONAZOLE 50 MG PO TABS
50.0000 mg | ORAL_TABLET | ORAL | Status: DC
  Filled 2014-06-06: qty 1

## 2014-06-06 MED ORDER — SODIUM CHLORIDE 0.9 % IJ SOLN
3.0000 mL | Freq: Two times a day (BID) | INTRAMUSCULAR | Status: DC
  Administered 2014-06-06 – 2014-06-10 (×4): 3 mL via INTRAVENOUS

## 2014-06-06 MED ORDER — FLUCONAZOLE 50 MG PO TABS: 100.0000 mg | ORAL_TABLET | ORAL | Status: DC

## 2014-06-06 MED ORDER — ENOXAPARIN SODIUM 30 MG/0.3ML ~~LOC~~ SOLN
30.0000 mg | SUBCUTANEOUS | Status: DC
  Administered 2014-06-06 – 2014-06-07 (×2): 30 mg via SUBCUTANEOUS
  Filled 2014-06-06: qty 0.3

## 2014-06-06 MED ORDER — IOHEXOL 350 MG/ML SOLN
100.0000 mL | Freq: Once | INTRAVENOUS | Status: AC | PRN
  Administered 2014-06-06: 100 mL via INTRAVENOUS

## 2014-06-06 MED ORDER — ONDANSETRON HCL 4 MG/2ML IJ SOLN
4.0000 mg | Freq: Four times a day (QID) | INTRAMUSCULAR | Status: DC | PRN
  Filled 2014-06-06 (×4): qty 2

## 2014-06-06 MED ORDER — CETYLPYRIDINIUM CHLORIDE 0.05 % MT LIQD
7.0000 mL | Freq: Two times a day (BID) | OROMUCOSAL | Status: DC
  Administered 2014-06-07 – 2014-06-13 (×10): 7 mL via OROMUCOSAL

## 2014-06-06 MED ORDER — ONDANSETRON HCL 4 MG/2ML IJ SOLN: 4.0000 mg | Freq: Three times a day (TID) | INTRAMUSCULAR | Status: DC | PRN

## 2014-06-06 MED ORDER — ROPINIROLE HCL 1 MG PO TABS
1.0000 mg | ORAL_TABLET | Freq: Two times a day (BID) | ORAL | Status: DC
  Administered 2014-06-06 – 2014-06-13 (×14): 1 mg via ORAL
  Filled 2014-06-06 (×16): qty 1

## 2014-06-06 MED ORDER — PIPERACILLIN-TAZOBACTAM IN DEX 2-0.25 GM/50ML IV SOLN
2.2500 g | Freq: Three times a day (TID) | INTRAVENOUS | Status: DC
  Filled 2014-06-06 (×3): qty 50

## 2014-06-06 MED ORDER — METOCLOPRAMIDE HCL 5 MG PO TABS
5.0000 mg | ORAL_TABLET | Freq: Two times a day (BID) | ORAL | Status: DC
  Administered 2014-06-06: 5 mg via ORAL
  Filled 2014-06-06 (×3): qty 1

## 2014-06-06 MED ORDER — EZETIMIBE 10 MG PO TABS
10.0000 mg | ORAL_TABLET | Freq: Every day | ORAL | Status: DC
  Administered 2014-06-06: 10 mg via ORAL
  Filled 2014-06-06 (×2): qty 1

## 2014-06-06 MED ORDER — VANCOMYCIN HCL 125 MG PO CAPS: 125.0000 mg | ORAL_CAPSULE | Freq: Four times a day (QID) | ORAL | Status: DC

## 2014-06-06 MED ORDER — ACETAMINOPHEN 650 MG RE SUPP: 650.0000 mg | Freq: Four times a day (QID) | RECTAL | Status: DC | PRN

## 2014-06-06 MED ORDER — OXYCODONE-ACETAMINOPHEN 5-325 MG PO TABS: 2.0000 | ORAL_TABLET | Freq: Once | ORAL | Status: DC

## 2014-06-06 MED ORDER — INSULIN ASPART 100 UNIT/ML ~~LOC~~ SOLN
0.0000 [IU] | Freq: Three times a day (TID) | SUBCUTANEOUS | Status: DC
  Administered 2014-06-10 – 2014-06-11 (×2): 2 [IU] via SUBCUTANEOUS

## 2014-06-06 MED ORDER — HYDROCODONE-ACETAMINOPHEN 5-325 MG PO TABS: 0.5000 | ORAL_TABLET | Freq: Four times a day (QID) | ORAL | Status: DC | PRN

## 2014-06-06 MED ORDER — VANCOMYCIN HCL 10 G IV SOLR
1250.0000 mg | Freq: Once | INTRAVENOUS | Status: DC
  Filled 2014-06-06: qty 1250

## 2014-06-06 MED ORDER — ONDANSETRON 4 MG PO TBDP: 8.0000 mg | ORAL_TABLET | Freq: Once | ORAL | Status: DC

## 2014-06-06 MED ORDER — GLIPIZIDE ER 10 MG PO TB24
10.0000 mg | ORAL_TABLET | Freq: Every day | ORAL | Status: DC | PRN
  Filled 2014-06-06: qty 1

## 2014-06-06 MED ORDER — ACETAMINOPHEN 325 MG PO TABS
650.0000 mg | ORAL_TABLET | Freq: Four times a day (QID) | ORAL | Status: DC | PRN
  Administered 2014-06-08 – 2014-06-11 (×2): 650 mg via ORAL
  Filled 2014-06-06 (×2): qty 2

## 2014-06-06 MED ORDER — DICYCLOMINE HCL 10 MG PO CAPS
10.0000 mg | ORAL_CAPSULE | Freq: Every day | ORAL | Status: DC | PRN
  Filled 2014-06-06: qty 1

## 2014-06-06 MED ORDER — IOHEXOL 300 MG/ML  SOLN
25.0000 mL | Freq: Once | INTRAMUSCULAR | Status: AC | PRN
  Administered 2014-06-06: 25 mL via ORAL

## 2014-06-06 MED ORDER — VANCOMYCIN 50 MG/ML ORAL SOLUTION
125.0000 mg | Freq: Four times a day (QID) | ORAL | Status: DC
  Administered 2014-06-06 – 2014-06-13 (×25): 125 mg via ORAL
  Filled 2014-06-06 (×30): qty 2.5

## 2014-06-06 MED ORDER — AMIODARONE HCL 200 MG PO TABS
200.0000 mg | ORAL_TABLET | Freq: Every day | ORAL | Status: DC
  Administered 2014-06-06 – 2014-06-13 (×7): 200 mg via ORAL
  Filled 2014-06-06 (×9): qty 1

## 2014-06-06 NOTE — ED Notes (Signed)
Pt due for dialysis tomorrow. Complaining of SOB since dialysis appointment yesterday. Pt diagnosed with Cdiff and PNA approx 2 weeks ago. Pt complaining of on going abd pain 10/10. Pt currently being treated for Cdiff and PNA. Pt also complained of CP for 30 minutes yesterday. No CP today. When EMS arrived, pt's sats 67% on 3L [place] (pt wears 3L [place] at home). Placed on nonrebreather, sats 100%. EMS placed back on 4L [place] sats 95%. CBG 113, BP 140/80, HR 87. Pt was intubated x2 and shocked x2 in the last 6 months.

## 2014-06-06 NOTE — Progress Notes (Addendum)
[facility] History and Physical  [name] YKD:[id number] DOB: 26-Aug-1941 DOA: 06/06/2014  Referring physician: ER  PCP: Gilford Rile, MD   Chief Complaint: Emesis  .  Diarrhea    HPI:  73 year old female with history of A. fib not on blood thinner medication, COPD, prior MI, diabetes, seizure, end-stage renal disease on hemodialysis Monday Wednesday Friday, presents for evaluation of abdominal pain and worsening shortness of breath. Patient was discharged on 8/6 for acute diverticulitis noted on CT scan treated with IV Zosyn subsequently switched to ciprofloxacin and Flagyl. She was again admitted on 8/24 with shortness of breath, was diagnosed with C. difficile and pneumonia discharged on 8/29 on cefuroxime and oral vancomycin. She presented to the ED on the same day she was discharged, observed in the ER for 6 hours and because of the stability in her clinical status was discharged again. She presents again today with similar complaints. States that she is still taking her by mouth vancomycin. She tolerated her hemodialysis with a full session yesterday.  Patient noticed urine mixed with Stool   coming from both vagina and rectum .Marland Kitchen This is new. She reports worsening abdominal cramping has been persistence of diarrhea for weeks since her diagnosis of C. difficile. Continues to endorse having loose stools but has had only 3 bowel movements since this morning. She also complaining of worsening shortness of breath since yesterday. Has severe COPD and baseline. Report an episode of diffuse anterior chest pain for about 30 minutes yesterday but that has resolved. No chest pain today. She use supplemental action at home at 3 L on nasal cannula and EMS was contacted because she experiencing worsening shortness of breath. Initially patient was set at 67% on 3 L. She was placed on nonrebreather mask and sats improved to 100%. She is currently on 4 L nasal cannula with adequate oxygenation.   At  this time her primary complaint is shortness of breath and low abnormal pain both are chronic in nature. She denies fever, severe headache, pleuritic chest pain. She has had been admitted several times in the past 6 months, was intuba of ted twice and shocked twice in the past 6 months.         Review of Systems: negative for the following  Constitutional: Denies fever, chills, diaphoresis, appetite change and fatigue.  HEENT: Denies photophobia, eye pain, redness, hearing loss, ear pain, congestion, sore throat, rhinorrhea, sneezing, mouth sores, trouble swallowing, neck pain, neck stiffness and tinnitus.  Respiratory: : Positive for cough and shortness of breath..  Cardiovascular: Denies chest pain, palpitations and leg swelling.  Gastrointestinal: Denies nausea, vomiting, abdominal pain, diarrhea, constipation, blood in stool and abdominal distention.  Genitourinary: Denies dysuria, urgency, frequency, hematuria, flank pain and difficulty urinating.  Musculoskeletal: Denies myalgias, back pain, joint swelling, arthralgias and gait problem.  Skin: Denies pallor, rash and wound.  Neurological: Denies dizziness, seizures, syncope, weakness, light-headedness, numbness and headaches.  Hematological: Denies adenopathy. Easy bruising, personal or family bleeding history  Psychiatric/Behavioral: Denies suicidal ideation, mood changes, confusion, nervousness, sleep disturbance and agitation       Past Medical History  Diagnosis Date  . Carotid artery occlusion     bilat CEA in 2012  . C. 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 Hypertension   . Atrial fibrillation April  2013  . Pulmonary hypertension   . Secondary hyperparathyroidism, renal   . COPD (chronic obstructive pulmonary disease)   . Hypercholesterolemia   .  History of MI (myocardial infarction)     "they say I had 2 years ago" (08/20/2012)  . GERD (gastroesophageal reflux disease)   . History of  pneumonia     "have it alot" (08/20/2012)  . Sleep apnea     "don't wear mask anymore"  . Type II diabetes mellitus   . History of blood transfusion 2013    "3 times so far in 2013:  4 pints one time; 2 pints another; ?# last time" (08/20/2012)  . Iron deficiency anemia   . Seizures 2012    after carotid surgery  . ESRD on dialysis     "[place]; Tues, Thurs; Sat"  . On home oxygen therapy     "24/7"   . Acute diverticulitis     Oct 2013, treated medically [facility], sigmoid diverticulitits  . CHF (congestive heart failure)   . Coronary artery disease   . Seizure disorder March 2011  . Myocardial infarction   . Renal cell carcinoma 2005?  Marland Kitchen Cancer of kidney 2005     Past Surgical History  Procedure Laterality Date  . Nephrectomy  2005    Right, for Renal cell carcinoma  . Carotid endarterectomy  2012    bilaterally; Dr. Kellie Simmering  . Av fistula placement  Jan. 7, 2011    Right  upper arm by Dr. Kellie Simmering  . Hd catheter  Aug. 22, 2013    [facility]  . Appendectomy      childhood  . Abdominal hysterectomy  1971?  Marland Kitchen Coronary angioplasty with stent placement  1990's    "1 total"  . Coronary angioplasty  1990's  . Av fistula repair  2013    right upper arm  . Esophagogastroduodenoscopy N/A 12/29/2012    Procedure: ESOPHAGOGASTRODUODENOSCOPY (EGD);  Surgeon: Jeryl Columbia, MD;  Location: [facility] ENDOSCOPY;  Service: Endoscopy;  Laterality: N/A;  . Colonoscopy with propofol N/A 12/31/2012    Procedure: COLONOSCOPY WITH PROPOFOL;  Surgeon: Jeryl Columbia, MD;  Location: [facility] ENDOSCOPY;  Service: Endoscopy;  Laterality: N/A;  currently IP at [place] History:  reports that she quit smoking about 10 years ago. Her smoking use included Cigarettes. She has a 67.5 pack-year smoking history. She has never used smokeless tobacco. She reports that she does not drink alcohol or use illicit drugs.    Allergies  Allergen Reactions  . Lipitor [Atorvastatin] Other (See  Comments)    Causes weakness and drowsiness  . Morphine And Related Nausea And Vomiting  . Nsaids Other (See Comments)    Unknown reported by previous hospital    Family History  Problem Relation Age of Onset  . Cancer Mother     pancreatic  . Diabetes Mother   . Stroke Father   . Coronary artery disease Father   . Heart disease Father 45    Heart Disease before age 15  . Hyperlipidemia Father   . Hypertension Father   . Heart attack Father   . Cancer Brother     Brain  . Kidney disease Brother     Kidney stones  . Emphysema Father     smoked  . Pancreatic cancer Mother      Prior to Admission medications   Medication Sig Start Date End Date Taking? Authorizing Provider  amiodarone (PACERONE) 200 MG tablet Take 200 mg by mouth daily. 04/29/14   Historical Provider, MD  calcium acetate (PHOSLO) 667 MG capsule Take 2,001 mg by mouth 3 (  three) times daily with meals.     Historical Provider, MD  cefUROXime (CEFTIN) 500 MG tablet Take 1 tablet (500 mg total) by mouth daily with supper. For 1 day 05/26/14   Domenic Polite, MD  dicyclomine (BENTYL) 10 MG capsule Take 10 mg by mouth daily as needed for spasms.    Historical Provider, MD  ezetimibe (ZETIA) 10 MG tablet Take 10 mg by mouth at bedtime.    Historical Provider, MD  fluconazole (DIFLUCAN) 100 MG tablet Take 50-100 mg by mouth daily. Takes 100mg  after dialysis Takes 50mg  on non dialysis days until completed    Historical Provider, MD  glipiZIDE (GLUCOTROL XL) 10 MG 24 hr tablet Take 10 mg by mouth daily as needed (for FSBS >150).    Historical Provider, MD  HYDROcodone-acetaminophen (NORCO/VICODIN) 5-325 MG per tablet Take 0.5 tablets by mouth every 6 (six) hours as needed for moderate pain.    Historical Provider, MD  ipratropium (ATROVENT HFA) 17 MCG/ACT inhaler Inhale 2 puffs into the lungs every 6 (six) hours as needed for wheezing.    Historical Provider, MD  metoCLOPramide (REGLAN) 5 MG tablet Take 5 mg by mouth 2 (two)  times daily.    Historical Provider, MD  multivitamin (RENA-VIT) TABS tablet Take 1 tablet by mouth daily with lunch.    Historical Provider, MD  ondansetron (ZOFRAN-ODT) 4 MG disintegrating tablet Take 1 tablet (4 mg total) by mouth every 8 (eight) hours as needed for nausea or vomiting. 05/27/14   Everlene Balls, MD  rOPINIRole (REQUIP) 1 MG tablet Take 1 mg by mouth 2 (two) times daily.    Historical Provider, MD  vancomycin (VANCOCIN) 125 MG capsule Take 1 capsule (125 mg total) by mouth 4 (four) times daily. For 16days 05/26/14   Domenic Polite, MD     Physical Exam: Filed Vitals:   06/06/14 1530 06/06/14 1615 06/06/14 1645 06/06/14 1700  BP: 160/47 166/68 166/66 157/64  Pulse: 84 82 85 84  Temp:      TempSrc:      Resp: 21 23 19 15   Height:      Weight:      SpO2: 97% 97% 97% 97%     Constitutional: Vital signs reviewed. Patient is a well-developed and well-nourished in no acute distress and cooperative with exam. Alert and oriented x3.  Head: Normocephalic and atraumatic  Ear: TM normal bilaterally  Mouth: no erythema or exudates, MMM  Eyes: PERRL, EOMI, conjunctivae normal, No scleral icterus.  Neck: Supple, Trachea midline normal ROM, No JVD, mass, thyromegaly, or carotid bruit present.  Cardiovascular: RRR, S1 normal, S2 normal, no MRG, pulses symmetric and intact bilaterally  Pulmonary/Chest: CTAB, no wheezes, rales, or rhonchi Intermittent wheezes heard in the posterior fields. Prolonged expiratory phase noted Abdominal: Soft. Non-tender, non-distended, bowel sounds are normal, no masses, organomegaly, or guarding present.  GU: no CVA tenderness Musculoskeletal: No joint deformities, erythema, or stiffness, ROM full and no nontender,Right upper extremity AV fistula with palpable thrill  Ext: no edema and no cyanosis, pulses palpable bilaterally (DP and PT)  Hematology: no cervical, inginal, or axillary adenopathy.  Neurological: A&O x3, Strenght is normal and symmetric  bilaterally, cranial nerve II-XII are grossly intact, no focal motor deficit, sensory intact to light touch bilaterally.  Skin: Warm, dry and intact. No rash, cyanosis, or clubbing.  Psychiatric: Normal mood and affect. speech and behavior is normal. Judgment and thought content normal. Cognition and memory are normal.       Labs on  Admission:    Basic Metabolic Panel:  Recent Labs Lab 06/06/14 1620  NA 132*  135*  K 4.7  5.0  CL 97  93*  CO2 29  GLUCOSE 103*  101*  BUN 27*  28*  CREATININE 5.20*  5.07*  CALCIUM 8.3*   Liver Function Tests:  Recent Labs Lab 06/06/14 1620  AST 15  ALT 11  ALKPHOS 95  BILITOT 0.3  PROT 5.8*  ALBUMIN 2.3*   No results found for this basename: LIPASE, AMYLASE,  in the last 168 hours No results found for this basename: AMMONIA,  in the last 168 hours CBC:  Recent Labs Lab 06/06/14 1620  WBC 12.9*  NEUTROABS 10.7*  HGB 8.7*  10.2*  HCT 30.6*  30.0*  MCV 98.1  PLT 210   Cardiac Enzymes: No results found for this basename: CKTOTAL, CKMB, CKMBINDEX, TROPONINI,  in the last 168 hours  BNP (last 3 results)  Recent Labs  08/27/13 2220 04/19/14 0410 06/06/14 1620  PROBNP 14565.0* 30133.0* >70000.0*      CBG: No results found for this basename: GLUCAP,  in the last 168 hours  Radiological Exams on Admission: Dg Chest Portable 1 View  06/06/2014   CLINICAL DATA:  Abdominal pain and shortness of breath  EXAM: PORTABLE CHEST - 1 VIEW  COMPARISON:  05/27/2014  FINDINGS: Cardiac shadow is stable in appearance. Increasing infiltrate is noted in the medial right lung base as well as in the lateral left lung base. Diffuse interstitial changes remain with mild interstitial edema.  IMPRESSION: Increasing bibasilar infiltrates.   Electronically Signed   By: Inez Catalina M.D.   On: 06/06/2014 16:03    EKG: Independently reviewed  Assessment/Plan Active Problems:   HCAP (healthcare-associated pneumonia)   Abdominal pain,  acute   1- abdominal pain, recent episode of Diverticulitis/C. difficile colitis abdominal pain,- Most recent CT scan done on 8/24 showed multiple subcapsular infarcts within the spleen -Most recent colonoscopy 12/2012 showed internal external hemorrhoids and diverticulosis.  -Admitted and placed on Zosyn IV in August for diverticulitis  -Subsequently discharged with 7 additional days of Ciprofloxacin and Flagyl for a total of 10 days of antibiotics. Following which she developed C. difficile colitis Discharged 8/29 on oral vancomycin, still taking it QID  Endorses continued abdominal cramping and diarrhea Given complicated history and the fact that the patient could possibly have a  Colovesical fistula will repeat CT scan of the abdomen   2-Anemia hemoglobin stable -Acute on chronic issue in setting of ESRD . There was no overt evidence of acute blood loss.  -Hgb of 7.1 on 08/05 and was transfused 2 units of PRBC during HD. Hgb up to 9.7 at discharge.    3-ESRD  -HD MWF in [place]  -Nephrology was consulted , Dr. Posey Pronto to see the patient    4-shortness of breath, persistent infiltrates, history of Chronic Diastolic CHF  -diuresis with HD yesterday, patient appears stable Did not feel that she needs urgent dialysis tonight Concern about residual pneumonia on CXR  Given history of C. difficile we'll hold off on broad-spectrum antibiotics at this time Given severe hypoxia upon presentation will obtain a CT chest to rule out PE, pneumonia, CHF   5-HTN  -stable  -was not on any antihypertensives at this time   6-COPD/Chronic Respiratory Failure  CT of the chest pending -continued on home prn nebs  On oxygen outpatient (3L) as she was prior to admission.   7-DM  Started on SSI-CBG's stable. A1c  5.0.  Continue glipizide      Code Status:   full Family Communication: bedside Disposition Plan: admit   Time spent: 70 mins   [facility] Pager  [phone]  If 7PM-7AM, please contact night-coverage [email or web address] Password [email or web address] 06/06/2014, 5:25 PM

## 2014-06-06 NOTE — ED Notes (Signed)
Attempted IV. RN at bedside with ultrasound for labs and IV access

## 2014-06-06 NOTE — Progress Notes (Signed)
ANTIBIOTIC CONSULT NOTE - INITIAL  Pharmacy Consult for vancomycin/zosyn Indication: pneumonia  Allergies  Allergen Reactions  . Lipitor [Atorvastatin] Other (See Comments)    Causes weakness and drowsiness  . Morphine And Related Nausea And Vomiting  . Nsaids Other (See Comments)    Unknown reported by previous hospital    Patient Measurements: Height: 5\' 4"  (162.6 cm) Weight: 160 lb (72.576 kg) IBW/kg (Calculated) : 54.7 Adjusted: 62 kg  Vital Signs: Temp: 98.6 F (37 C) (09/08 1518) Temp src: Oral (09/08 1518) BP: 166/68 mmHg (09/08 1615) Pulse Rate: 82 (09/08 1615) Intake/Output from previous day:   Intake/Output from this shift:    Labs:  Recent Labs  06/06/14 1620  HGB 10.2*  CREATININE 5.20*   Estimated Creatinine Clearance: 9.6 ml/min (by C-G formula based on Cr of 5.2). No results found for this basename: VANCOTROUGH, Corlis Leak, VANCORANDOM, [place], GENTPEAK, GENTRANDOM, TOBRATROUGH, TOBRAPEAK, TOBRARND, AMIKACINPEAK, AMIKACINTROU, AMIKACIN,  in the last 72 hours   Microbiology: Recent Results (from the past 720 hour(s))  CULTURE, BLOOD (ROUTINE X 2)     Status: None   Collection Time    05/21/14  8:56 PM      Result Value Ref Range Status   Specimen Description BLOOD LEFT ARM   Final   Special Requests BOTTLES DRAWN AEROBIC ONLY 4CC   Final   Culture  Setup Time     Final   Value: 05/22/2014 01:48     Performed at [facility]   Culture     Final   Value: NO GROWTH 5 DAYS     Performed at [facility]   Report Status 05/28/2014 FINAL   Final  CULTURE, BLOOD (ROUTINE X 2)     Status: None   Collection Time    05/21/14  9:30 PM      Result Value Ref Range Status   Specimen Description BLOOD LEFT ANTECUBITAL   Final   Special Requests BOTTLES DRAWN AEROBIC AND ANAEROBIC 5CC EA   Final   Culture  Setup Time     Final   Value: 05/22/2014 01:48     Performed at [facility]   Culture     Final   Value: NO GROWTH 5 DAYS      Performed at [facility]   Report Status 05/28/2014 FINAL   Final  CLOSTRIDIUM DIFFICILE BY PCR     Status: Abnormal   Collection Time    05/22/14  3:33 PM      Result Value Ref Range Status   C difficile by pcr POSITIVE (*) NEGATIVE Final   Comment: CRITICAL RESULT CALLED TO, READ BACK BY AND VERIFIED WITH:     [email or web address],EX AT 1815 BY L.PITT 05/22/14    Medical History: Past Medical History  Diagnosis Date  . Carotid artery occlusion     bilat CEA in 2012  . C. 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 Hypertension   . Atrial fibrillation April  2013  . Pulmonary hypertension   . Secondary hyperparathyroidism, renal   . COPD (chronic obstructive pulmonary disease)   . Hypercholesterolemia   . History of MI (myocardial infarction)     "they say I had 2 years ago" (08/20/2012)  . GERD (gastroesophageal reflux disease)   . History of pneumonia     "have it alot" (08/20/2012)  . Sleep apnea     "don't wear mask anymore"  . Type II diabetes mellitus   .  History of blood transfusion 2013    "3 times so far in 2013:  4 pints one time; 2 pints another; ?# last time" (08/20/2012)  . Iron deficiency anemia   . Seizures 2012    after carotid surgery  . ESRD on dialysis     "[place]; Tues, Thurs; Sat"  . On home oxygen therapy     "24/7"   . Acute diverticulitis     Oct 2013, treated medically [facility], sigmoid diverticulitits  . CHF (congestive heart failure)   . Coronary artery disease   . Seizure disorder March 2011  . Myocardial infarction   . Renal cell carcinoma 2005?  Marland Kitchen Cancer of kidney 2005    Assessment: 77 YOF with hx of ESRD, recent hospitalization for pneumonia and c-diff (discharged 2 weeks ago) who presented with worsening sob. Pharmacy is consulted to start vancomycin and zosyn empiric for HCAP.   Goal of Therapy:  Pre-HD vancomycin level 15-25 mcg/ml  Plan:  - Vancomycin loading dose 1250mg  IV x1 - Zosyn 2.25 g  IV Q 8 hrs - f/u HD schedule and tolerance to order maintenance dose.     Maryanna Shape, PharmD, BCPS  Clinical Pharmacist  Pager: [phone]   06/06/2014,4:29 PM

## 2014-06-06 NOTE — ED Provider Notes (Signed)
CSN: [id number]     Arrival date & time    History   First MD Initiated Contact with Patient 06/06/14 1503     Chief Complaint  Patient presents with  . Marine scientist     (Consider location/radiation/quality/duration/timing/severity/associated sxs/prior Treatment) HPI  73 year old female with history of A. fib not on blood thinner medication, COPD, prior MI, diabetes, seizure, end-stage renal disease currently on hemodialysis presents for evaluation of abdominal pain or shortness of breath. Patient lives at home with her daughter. She was diagnosed with C. difficile and pneumonia last month and was hospitalized and released approximately 2 weeks ago. She is currently taking medication for that. A week ago she noticed stool coming from both vagina and rectum where she had a bowel movement. This is new. She reports worsening abdominal cramping has been persistence for weeks since her diagnosis of C. difficile. Continues to endorse having loose stools once every hour with minimal improvement. She also complaining of worsening shortness of breath since yesterday. Report an episode of diffuse anterior chest pain for about 30 yesterday but that has resolved. No chest pain today. She use supplemental action at home at 3 L on nasal cannula and EMS was contacted because she experiencing worsening shortness of breath. Initially patient was set at 67% on 3 L. She was placed on nonrebreather mask and sats improved to 100%. She is currently on 4 L nasal cannula with adequate oxygenation. At this time her primary complaint is shortness of breath and low abnormal pain both are chronic in nature. She denies fever, severe headache, pleuritic chest pain.  She has had been admitted several times in the past 6 months, was intubated twice and shocked twice in the past 6 months.  Had dialysis yesterday.     Past Medical History  Diagnosis Date  . Carotid artery occlusion     bilat CEA in 2012  . C. 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 Hypertension   . Atrial fibrillation April  2013  . Pulmonary hypertension   . Secondary hyperparathyroidism, renal   . COPD (chronic obstructive pulmonary disease)   . Hypercholesterolemia   . History of MI (myocardial infarction)     "they say I had 2 years ago" (08/20/2012)  . GERD (gastroesophageal reflux disease)   . History of pneumonia     "have it alot" (08/20/2012)  . Sleep apnea     "don't wear mask anymore"  . Type II diabetes mellitus   . History of blood transfusion 2013    "3 times so far in 2013:  4 pints one time; 2 pints another; ?# last time" (08/20/2012)  . Iron deficiency anemia   . Seizures 2012    after carotid surgery  . ESRD on dialysis     "[place]; Tues, Thurs; Sat"  . On home oxygen therapy     "24/7"   . Acute diverticulitis     Oct 2013, treated medically [facility], sigmoid diverticulitits  . CHF (congestive heart failure)   . Coronary artery disease   . Seizure disorder March 2011  . Myocardial infarction   . Renal cell carcinoma 2005?  Marland Kitchen Cancer of kidney 2005   Past Surgical History  Procedure Laterality Date  . Nephrectomy  2005    Right, for Renal cell carcinoma  . Carotid endarterectomy  2012    bilaterally; Dr. Kellie Simmering  . Av fistula placement  Jan. 7, 2011  Right  upper arm by Dr. Kellie Simmering  . Hd catheter  Aug. 22, 2013    [facility]  . Appendectomy      childhood  . Abdominal hysterectomy  1971?  Marland Kitchen Coronary angioplasty with stent placement  1990's    "1 total"  . Coronary angioplasty  1990's  . Av fistula repair  2013    right upper arm  . Esophagogastroduodenoscopy N/A 12/29/2012    Procedure: ESOPHAGOGASTRODUODENOSCOPY (EGD);  Surgeon: Jeryl Columbia, MD;  Location: [facility] ENDOSCOPY;  Service: Endoscopy;  Laterality: N/A;  . Colonoscopy with propofol N/A 12/31/2012    Procedure: COLONOSCOPY WITH PROPOFOL;  Surgeon: Jeryl Columbia, MD;  Location: [facility] ENDOSCOPY;  Service:  Endoscopy;  Laterality: N/A;  currently IP at [place] History  Problem Relation Age of Onset  . Cancer Mother     pancreatic  . Diabetes Mother   . Stroke Father   . Coronary artery disease Father   . Heart disease Father 8    Heart Disease before age 58  . Hyperlipidemia Father   . Hypertension Father   . Heart attack Father   . Cancer Brother     Brain  . Kidney disease Brother     Kidney stones  . Emphysema Father     smoked  . Pancreatic cancer Mother    History  Substance Use Topics  . Smoking status: Former Smoker -- 1.50 packs/day for 45 years    Types: Cigarettes    Quit date: 09/30/2003  . Smokeless tobacco: Never Used  . Alcohol Use: No   OB History   Grav Para Term Preterm Abortions TAB SAB Ect Mult Living                 Review of Systems  All other systems reviewed and are negative.     Allergies  Lipitor; Morphine and related; and Nsaids  Home Medications   Prior to Admission medications   Medication Sig Start Date End Date Taking? Authorizing Provider  amiodarone (PACERONE) 200 MG tablet Take 200 mg by mouth daily. 04/29/14   Historical Provider, MD  calcium acetate (PHOSLO) 667 MG capsule Take 2,001 mg by mouth 3 (three) times daily with meals.     Historical Provider, MD  cefUROXime (CEFTIN) 500 MG tablet Take 1 tablet (500 mg total) by mouth daily with supper. For 1 day 05/26/14   Domenic Polite, MD  dicyclomine (BENTYL) 10 MG capsule Take 10 mg by mouth daily as needed for spasms.    Historical Provider, MD  ezetimibe (ZETIA) 10 MG tablet Take 10 mg by mouth at bedtime.    Historical Provider, MD  fluconazole (DIFLUCAN) 100 MG tablet Take 50-100 mg by mouth daily. Takes 100mg  after dialysis Takes 50mg  on non dialysis days until completed    Historical Provider, MD  glipiZIDE (GLUCOTROL XL) 10 MG 24 hr tablet Take 10 mg by mouth daily as needed (for FSBS >150).    Historical Provider, MD  HYDROcodone-acetaminophen (NORCO/VICODIN) 5-325  MG per tablet Take 0.5 tablets by mouth every 6 (six) hours as needed for moderate pain.    Historical Provider, MD  ipratropium (ATROVENT HFA) 17 MCG/ACT inhaler Inhale 2 puffs into the lungs every 6 (six) hours as needed for wheezing.    Historical Provider, MD  metoCLOPramide (REGLAN) 5 MG tablet Take 5 mg by mouth 2 (two) times daily.    Historical Provider, MD  multivitamin (RENA-VIT) TABS tablet Take 1 tablet by  mouth daily with lunch.    Historical Provider, MD  ondansetron (ZOFRAN-ODT) 4 MG disintegrating tablet Take 1 tablet (4 mg total) by mouth every 8 (eight) hours as needed for nausea or vomiting. 05/27/14   Everlene Balls, MD  rOPINIRole (REQUIP) 1 MG tablet Take 1 mg by mouth 2 (two) times daily.    Historical Provider, MD  vancomycin (VANCOCIN) 125 MG capsule Take 1 capsule (125 mg total) by mouth 4 (four) times daily. For 16days 05/26/14   Domenic Polite, MD   BP 173/89  Pulse 91  Temp(Src) 98.3 F (36.8 C) (Oral)  Resp 22  SpO2 94% Physical Exam  Nursing note and vitals reviewed. Constitutional: She is oriented to person, place, and time. She appears well-developed and well-nourished. No distress.  HENT:  Head: Atraumatic.  Mouth is dry  Eyes: Conjunctivae are normal.  Neck: Neck supple. No JVD present.  Cardiovascular: Normal rate and regular rhythm.   Murmur heard. Pulmonary/Chest: Effort normal and breath sounds normal.  Decreased breath sounds with prolonged expiratory phase with faint crackles  Abdominal: Soft. Bowel sounds are normal. There is tenderness (diffuse abdominal tenderness most significant to low abdomen without guarding or rebound tenderness.).  Genitourinary:  Chaperone present:  On speculum exam pt has moderate loose stool in vaginal vault.  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  Ttp.    Musculoskeletal: She exhibits edema (Trace pitting edema to bilateral lower extremity).  Neurological: She is alert and oriented to  person, place, and time.  Skin: No rash noted.  Psychiatric: She has a normal mood and affect.    ED Course  Procedures (including critical care time)  Patient with multiple comorbidities who is a dialysis patient he with chronic worsening lower abdominal pain with a known diagnosis of C. difficile and having worsening shortness of breath with history of CHF and COPD. Patient also mentioned noticing fecal material, from her vagina and having bowel movement. This is concerning for rectovaginal fistula. She has history of diverticulitis in the past. At this time patient is without any significant respiratory distress, however EMS report patient sats at 67% on 3 L of oxygen prior to arrival. Recent hospitalization and recent diagnosis and treatment of HCAP.  Cannot r/u PE due to age, but low suspicion.  Work up initiated.  Care discussed with Dr. Ashok Cordia.    4:08 PM Chest x-ray demonstrated worsening migraine basilar infiltrates. We'll continue to treat her HCAP With Zosyn and vanc pharmacy to dose.  Hypoxia improves with supplemental O2.    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  Plan do consult medicine for admission.  Pt allergic to morphine, pain medication offered, pt declined.  Will continue with close monitoring, abx treatment, and will need admission.    4:58 PM i have consulted [facility], Dr. Allyson Sabal, who agrees to admit pt to step down, team 10, under her care.  Pt is on isolation precaution.  Pt made aware of plan.    CRITICAL CARE Performed by: Domenic Moras Total critical care time: 30 min Critical care time was exclusive of separately billable procedures and treating other patients. Critical care was necessary to treat or prevent imminent or life-threatening deterioration. Critical care was time spent personally by me on the following activities: development of treatment plan with patient and/or surrogate as  well as nursing, discussions with consultants, evaluation of patient's response to treatment, examination of patient, obtaining history from patient  or surrogate, ordering and performing treatments and interventions, ordering and review of laboratory studies, ordering and review of radiographic studies, pulse oximetry and re-evaluation of patient's condition.   Labs Review Labs Reviewed  CBC WITH DIFFERENTIAL - Abnormal; Notable for the following:    WBC 12.9 (*)    RBC 3.12 (*)    Hemoglobin 8.7 (*)    HCT 30.6 (*)    MCHC 28.4 (*)    RDW 17.8 (*)    Neutrophils Relative % 83 (*)    Neutro Abs 10.7 (*)    Lymphocytes Relative 8 (*)    All other components within normal limits  I-STAT CHEM 8, ED - Abnormal; Notable for the following:    Sodium 132 (*)    BUN 27 (*)    Creatinine, Ser 5.20 (*)    Glucose, Bld 103 (*)    Calcium, Ion 1.01 (*)    Hemoglobin 10.2 (*)    HCT 30.0 (*)    All other components within normal limits  URINALYSIS, ROUTINE W REFLEX MICROSCOPIC  PRO B NATRIURETIC PEPTIDE  COMPREHENSIVE METABOLIC PANEL  I-STAT CG4 LACTIC ACID, ED  Randolm Idol, ED    Imaging Review Dg Chest Portable 1 View  06/06/2014   CLINICAL DATA:  Abdominal pain and shortness of breath  EXAM: PORTABLE CHEST - 1 VIEW  COMPARISON:  05/27/2014  FINDINGS: Cardiac shadow is stable in appearance. Increasing infiltrate is noted in the medial right lung base as well as in the lateral left lung base. Diffuse interstitial changes remain with mild interstitial edema.  IMPRESSION: Increasing bibasilar infiltrates.   Electronically Signed   By: Inez Catalina M.D.   On: 06/06/2014 16:03     EKG Interpretation   Date/Time:  Tuesday June 06 2014 15:10:09 EDT Ventricular Rate:  87 PR Interval:  189 QRS Duration: 156 QT Interval:  448 QTC Calculation: 539 R Axis:   -12 Text Interpretation:  Sinus rhythm Left bundle branch block No significant  change since last tracing Confirmed by Ashok Cordia   MD, Lennette Bihari ([id number]) on  06/06/2014 3:32:53 PM      MDM   Final diagnoses:  HCAP (healthcare-associated pneumonia)  Acute respiratory failure with hypoxia  Clostridium difficile diarrhea  Rectovaginal fistula    BP 166/68  Pulse 82  Temp(Src) 98.6 F (37 C) (Oral)  Resp 23  Ht 5\' 4"  (1.626 m)  Wt 160 lb (72.576 kg)  BMI 27.45 kg/m2  SpO2 97%  I have reviewed nursing notes and vital signs. I personally reviewed the imaging tests through PACS system  I reviewed available ER/hospitalization records thought the EMR     Domenic Moras, [place] 06/06/14 1659

## 2014-06-06 NOTE — ED Notes (Signed)
pre arrival documentation, vitals, primary assessment and screening documented in error

## 2014-06-06 NOTE — ED Notes (Signed)
Pt to receive CT scan before transport to [place]; Report given to floor

## 2014-06-07 ENCOUNTER — Encounter (HOSPITAL_COMMUNITY): Payer: Self-pay | Admitting: General Surgery

## 2014-06-07 DIAGNOSIS — N321 Vesicointestinal fistula: Secondary | ICD-10-CM | POA: Diagnosis present

## 2014-06-07 LAB — GLUCOSE, CAPILLARY
GLUCOSE-CAPILLARY: 87 mg/dL (ref 70–99)
Glucose-Capillary: 98 mg/dL (ref 70–99)
Glucose-Capillary: 102 mg/dL — ABNORMAL HIGH (ref 70–99)

## 2014-06-07 LAB — COMPREHENSIVE METABOLIC PANEL
ALBUMIN: 2.1 g/dL — AB (ref 3.5–5.2)
ALK PHOS: 84 U/L (ref 39–117)
ALT: 9 U/L (ref 0–35)
AST: 13 U/L (ref 0–37)
Anion gap: 13 (ref 5–15)
BUN: 32 mg/dL — ABNORMAL HIGH (ref 6–23)
CO2: 29 mEq/L (ref 19–32)
Calcium: 8.1 mg/dL — ABNORMAL LOW (ref 8.4–10.5)
Chloride: 93 mEq/L — ABNORMAL LOW (ref 96–112)
Creatinine, Ser: 5.59 mg/dL — ABNORMAL HIGH (ref 0.50–1.10)
GFR calc Af Amer: 8 mL/min — ABNORMAL LOW (ref >90)
GFR calc non Af Amer: 7 mL/min — ABNORMAL LOW (ref >90)
Glucose, Bld: 133 mg/dL — ABNORMAL HIGH (ref 70–99)
POTASSIUM: 4.6 meq/L (ref 3.7–5.3)
SODIUM: 135 meq/L — AB (ref 137–147)
Total Bilirubin: 0.2 mg/dL — ABNORMAL LOW (ref 0.3–1.2)
Total Protein: 5.3 g/dL — ABNORMAL LOW (ref 6.0–8.3)

## 2014-06-07 LAB — CBC
HEMATOCRIT: 27.6 % — AB (ref 36.0–46.0)
HEMOGLOBIN: 7.9 g/dL — AB (ref 12.0–15.0)
MCH: 28.4 pg (ref 26.0–34.0)
MCHC: 28.6 g/dL — AB (ref 30.0–36.0)
MCV: 99.3 fL (ref 78.0–100.0)
Platelets: 235 10*3/uL (ref 150–400)
RBC: 2.78 MIL/uL — ABNORMAL LOW (ref 3.87–5.11)
RDW: 17.6 % — ABNORMAL HIGH (ref 11.5–15.5)
WBC: 11.8 10*3/uL — ABNORMAL HIGH (ref 4.0–10.5)

## 2014-06-07 LAB — CLOSTRIDIUM DIFFICILE BY PCR: Toxigenic C. Difficile by PCR: NEGATIVE

## 2014-06-07 LAB — PREPARE RBC (CROSSMATCH)

## 2014-06-07 LAB — TROPONIN I: Troponin I: <0.3 ng/mL (ref <0.30)

## 2014-06-07 MED ORDER — DEXTROSE-NACL 5-0.45 % IV SOLN
INTRAVENOUS | Status: DC
  Administered 2014-06-07: 21:00:00 via INTRAVENOUS

## 2014-06-07 MED ORDER — PENTAFLUOROPROP-TETRAFLUOROETH EX AERO: 1.0000 "application " | INHALATION_SPRAY | CUTANEOUS | Status: DC | PRN

## 2014-06-07 MED ORDER — NEPRO/CARBSTEADY PO LIQD
237.0000 mL | ORAL | Status: DC | PRN
  Filled 2014-06-07: qty 237

## 2014-06-07 MED ORDER — ALBUTEROL SULFATE (2.5 MG/3ML) 0.083% IN NEBU: 2.5000 mg | INHALATION_SOLUTION | Freq: Once | RESPIRATORY_TRACT | Status: DC

## 2014-06-07 MED ORDER — MORPHINE SULFATE 2 MG/ML IJ SOLN: 1.0000 mg | INTRAMUSCULAR | Status: DC | PRN

## 2014-06-07 MED ORDER — SODIUM CHLORIDE 0.9 % IV SOLN: 100.0000 mL | INTRAVENOUS | Status: DC | PRN

## 2014-06-07 MED ORDER — CALCIUM ACETATE 667 MG PO CAPS
2001.0000 mg | ORAL_CAPSULE | Freq: Three times a day (TID) | ORAL | Status: DC
  Administered 2014-06-07: 2001 mg via ORAL
  Filled 2014-06-07 (×6): qty 3

## 2014-06-07 MED ORDER — IPRATROPIUM BROMIDE 0.02 % IN SOLN
RESPIRATORY_TRACT | Status: AC
  Filled 2014-06-07: qty 2.5

## 2014-06-07 MED ORDER — IPRATROPIUM BROMIDE 0.02 % IN SOLN
RESPIRATORY_TRACT | Status: AC
  Administered 2014-06-07: 0.5 mg via RESPIRATORY_TRACT
  Filled 2014-06-07: qty 2.5

## 2014-06-07 MED ORDER — KCL IN DEXTROSE-NACL 20-5-0.9 MEQ/L-%-% IV SOLN
INTRAVENOUS | Status: DC
  Administered 2014-06-07: 09:00:00 via INTRAVENOUS
  Filled 2014-06-07 (×2): qty 1000

## 2014-06-07 MED ORDER — HEPARIN SODIUM (PORCINE) 1000 UNIT/ML DIALYSIS: 1000.0000 [IU] | INTRAMUSCULAR | Status: DC | PRN

## 2014-06-07 MED ORDER — DARBEPOETIN ALFA-POLYSORBATE 60 MCG/0.3ML IJ SOLN
INTRAMUSCULAR | Status: AC
Start: 2014-06-07 — End: 2014-06-08
  Filled 2014-06-07: qty 0.6

## 2014-06-07 MED ORDER — DARBEPOETIN ALFA-POLYSORBATE 150 MCG/0.3ML IJ SOLN
120.0000 ug | INTRAMUSCULAR | Status: DC
  Administered 2014-06-07: 120 ug via INTRAVENOUS

## 2014-06-07 MED ORDER — CALCITRIOL 0.25 MCG PO CAPS
0.2500 ug | ORAL_CAPSULE | ORAL | Status: DC
  Administered 2014-06-07 – 2014-06-09 (×2): 0.25 ug via ORAL
  Filled 2014-06-07 (×3): qty 1

## 2014-06-07 MED ORDER — DARBEPOETIN ALFA-POLYSORBATE 150 MCG/0.3ML IJ SOLN
120.0000 ug | INTRAMUSCULAR | Status: DC
  Administered 2014-06-07: 120 ug via INTRAVENOUS
  Filled 2014-06-07 (×2): qty 0.3

## 2014-06-07 MED ORDER — SODIUM CHLORIDE 0.9 % IV SOLN: Freq: Once | INTRAVENOUS | Status: DC

## 2014-06-07 MED ORDER — ALBUTEROL SULFATE (2.5 MG/3ML) 0.083% IN NEBU
INHALATION_SOLUTION | RESPIRATORY_TRACT | Status: AC
  Administered 2014-06-07: 2.5 mg
  Filled 2014-06-07: qty 3

## 2014-06-07 MED ORDER — DARBEPOETIN ALFA-POLYSORBATE 60 MCG/0.3ML IJ SOLN
INTRAMUSCULAR | Status: AC
  Filled 2014-06-07: qty 0.6

## 2014-06-07 MED ORDER — ONDANSETRON HCL 4 MG/2ML IJ SOLN
4.0000 mg | Freq: Four times a day (QID) | INTRAMUSCULAR | Status: DC
  Administered 2014-06-07 – 2014-06-12 (×20): 4 mg via INTRAVENOUS
  Filled 2014-06-07 (×17): qty 2

## 2014-06-07 MED ORDER — ALTEPLASE 2 MG IJ SOLR
2.0000 mg | Freq: Once | INTRAMUSCULAR | Status: DC | PRN
  Filled 2014-06-07: qty 2

## 2014-06-07 MED ORDER — METOPROLOL TARTRATE 1 MG/ML IV SOLN
5.0000 mg | INTRAVENOUS | Status: DC
  Administered 2014-06-07 – 2014-06-09 (×9): 5 mg via INTRAVENOUS
  Filled 2014-06-07 (×15): qty 5

## 2014-06-07 MED ORDER — DARBEPOETIN ALFA-POLYSORBATE 150 MCG/0.3ML IJ SOLN: 120.0000 ug | INTRAMUSCULAR | Status: DC

## 2014-06-07 MED ORDER — LIDOCAINE HCL (PF) 1 % IJ SOLN: 5.0000 mL | INTRAMUSCULAR | Status: DC | PRN

## 2014-06-07 MED ORDER — HEPARIN SODIUM (PORCINE) 5000 UNIT/ML IJ SOLN
5000.0000 [IU] | Freq: Three times a day (TID) | INTRAMUSCULAR | Status: DC
  Administered 2014-06-07 – 2014-06-11 (×11): 5000 [IU] via SUBCUTANEOUS
  Filled 2014-06-07 (×17): qty 1

## 2014-06-07 MED ORDER — LIDOCAINE-PRILOCAINE 2.5-2.5 % EX CREA: 1.0000 "application " | TOPICAL_CREAM | CUTANEOUS | Status: DC | PRN

## 2014-06-07 MED ORDER — DEXTROSE 5 % IV SOLN
1.0000 g | INTRAVENOUS | Status: DC
  Administered 2014-06-07 – 2014-06-12 (×6): 1 g via INTRAVENOUS
  Filled 2014-06-07 (×7): qty 10

## 2014-06-07 MED ORDER — IPRATROPIUM BROMIDE 0.02 % IN SOLN
0.5000 mg | Freq: Four times a day (QID) | RESPIRATORY_TRACT | Status: DC | PRN
  Administered 2014-06-07 – 2014-06-12 (×4): 0.5 mg via RESPIRATORY_TRACT
  Filled 2014-06-07 (×3): qty 2.5

## 2014-06-07 MED ORDER — METRONIDAZOLE IN NACL 5-0.79 MG/ML-% IV SOLN
500.0000 mg | Freq: Four times a day (QID) | INTRAVENOUS | Status: DC
  Administered 2014-06-07 – 2014-06-13 (×23): 500 mg via INTRAVENOUS
  Filled 2014-06-07 (×28): qty 100

## 2014-06-07 NOTE — Progress Notes (Signed)
[name]  [name] DJM:[id number] DOB: [birth year] DOA: 06/06/2014 PCP: Gilford Rile, MD  Brief narrative: 73 year old female patient with past history of afib not on chronic anticoagulation, COPD, diabetes, seizure disorder, chronic kidney disease on dialysis. She presented to the emergency department because of ongoing abdominal pain and increasing shortness of breath. Patient had recently been hospitalized and subsequently discharged on 8/6 for acute diverticulitis noted on CT scan. She was discharged on Cipro and Flagyl. She was readmitted on 8/24 were shortness of breath with subsequent diagnosed with C. difficile colitis and pneumonia. She was discharged on 8/29 on cefuroxime and oral vancomycin. She returned to the the ER on the same date. She was observed in the ER for 6 hours and was stable and had no indications for readmission so she was discharged from the ER.  She returns to the ER on 9/8 after noticing urine next is stool coming both from her vagina and rectum which was a new finding. She also reported increasing and worsening abdominal cramping with diarrhea. Also noticed increased shortness of breath for the past 24 hours. She reported an episode of diffuse anterior chest pain duration 30 minutes 24 hours prior to presentation but this has not recurred. She is on chronic oxygen at home at 3 L per minute. Upon their arrival her saturations were initially 67% on 3 L of oxygen. She was placed on 100% nonrebreather mask and saturations improved to 100%. Upon the admitting physician's exam the patient was stable on 4 L nasal cannula oxygen. In the ER her white count was 13,300 with a normal platelet count . She was afebrile.  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 Gen. surgery has been consulted. Nephrology is also following to manage her  dialysis.  HPI/Subjective: Endorsing less abdominal discomfort. Still with stool-like appearance to mucoid vaginal drainage.  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 difficile colitis-appreciate Gen. surgery assistance-agree with current bowel rest and antibiotics but suspect resection with temporary colostomy will likely be necessary-although NPO will need to be cautious with IV fluid hydration since she is a dialysis patient -current blood pressure in hypertensive range-convert medications to IV route where possible-utilize IV narcotics with scheduled Zofran noting history of nausea on IV morphine    C. difficile colitis -Continue oral vancomycin-most recent C. difficile PCR on 9/8 was negative    Acute respiratory failure with hypoxia/? HCAP versus volume overload -Continue empiric antibiotics and pulmonary toileting-continue dialysis awaiting overhydration     CKD (chronic kidney disease) stage V requiring chronic dialysis -Per nephrology-dialysis MWF    DM2 (diabetes mellitus, type 2) -CBGs well controlled on SSI    HTN (hypertension) -Uncontrolled and likely exacerbated by ongoing abdominal pain-begin scheduled IV Lopressor-avoid hydralazine to minimize risk for tachycardia   Paroxysmal atrial fibrillation -Continue by mouth amiodarone-adding beta blocker IV for hypertensive control    Chronic respiratory failure with hypoxia due to:   A) Pulmonary HTN   B) OSA (obstructive sleep apnea)   C) COPD (chronic obstructive pulmonary disease) -All appear compensated at this point-on 3 L nasal cannula at home  DVT prophylaxis: Lovenox Code Status: Full Family Communication: No family at bedside Disposition Plan/Expected LOS: Step down  Consultants: Nephrology General surgery  Procedures: None  Cultures: 9/8 C.  difficile negative - was positive  05/22/2014  Antibiotics: Oral vancomycin 8/23 >>>  Objective: Blood pressure 185/84, pulse 113, temperature 98.6 F (37 C), temperature source Oral, resp. rate 26, height 5\' 4"  (1.626 m), weight 160 lb (72.576 kg), SpO2 96.00%.  Intake/Output Summary (Last 24 hours) at 06/07/14 1354 Last data filed at 06/07/14 1100  Gross per 24 hour  Intake    500 ml  Output      0 ml  Net    500 ml   Exam: Gen: No acute respiratory distress Chest: Bilateral basilar crackles, 4 L Cardiac: Regular rate and rhythm, S1-S2, no rubs murmurs or gallops, no peripheral edema, no JVD Abdomen: Soft nontender nondistended without obvious hepatosplenomegaly, no ascites Extremities: Symmetrical in appearance without cyanosis, clubbing or effusion  Scheduled Meds:  Scheduled Meds: . sodium chloride   Intravenous Once  . albuterol  2.5 mg Nebulization Once  . amiodarone  200 mg Oral Daily  . antiseptic oral rinse  7 mL Mouth Rinse BID  . calcitRIOL  0.25 mcg Oral Q M,W,F  . calcium acetate  2,001 mg Oral TID WC  . cefTRIAXone (ROCEPHIN)  IV  1 g Intravenous Q24H  . darbepoetin (ARANESP) injection - DIALYSIS  120 mcg Intravenous Q Wed-HD  . enoxaparin (LOVENOX) injection  30 mg Subcutaneous Q24H  . insulin aspart  0-15 Units Subcutaneous TID WC  . ipratropium      . levalbuterol  0.63 mg Nebulization Q6H  . metronidazole  500 mg Intravenous Q6H  . ondansetron (ZOFRAN) IV  4 mg Intravenous 4 times per day  . rOPINIRole  1 mg Oral BID  . sodium chloride  3 mL Intravenous Q12H  . vancomycin  125 mg Oral 4 times per day    Data Reviewed: Basic Metabolic Panel:  Recent Labs Lab 06/06/14 1620 06/06/14 1710 06/07/14 0013  NA 132*  135*  --  135*  K 4.7  5.0  --  4.6  CL 97  93*  --  93*  CO2 29  --  29  GLUCOSE 103*  101*  --  133*  BUN 27*  28*  --  32*  CREATININE 5.20*  5.07* 5.21* 5.59*  CALCIUM 8.3*  --  8.1*   Liver Function Tests:  Recent Labs Lab 06/06/14 1620 06/07/14 0013   AST 15 13  ALT 11 9  ALKPHOS 95 84  BILITOT 0.3 0.2*  PROT 5.8* 5.3*  ALBUMIN 2.3* 2.1*   CBC:  Recent Labs Lab 06/06/14 1620 06/06/14 1710 06/07/14 0013  WBC 12.9* 13.3* 11.8*  NEUTROABS 10.7*  --   --   HGB 8.7*  10.2* 8.6* 7.9*  HCT 30.6*  30.0* 30.4* 27.6*  MCV 98.1 98.7 99.3  PLT 210 212 235   Cardiac Enzymes:  Recent Labs Lab 06/06/14 1713 06/07/14 0013  TROPONINI <0.30 <0.30   CBG:  Recent Labs Lab 06/06/14 2154 06/07/14 0818 06/07/14 1239  GLUCAP 75 98 102*    Recent Results (from the past 240 hour(s))  MRSA PCR SCREENING     Status: None   Collection Time    06/06/14  9:16 PM      Result Value Ref Range Status   MRSA by PCR NEGATIVE  NEGATIVE Final   Comment:            The GeneXpert MRSA Assay (FDA     approved for NASAL specimens     only), is one component of a     comprehensive  MRSA colonization     surveillance program. It is not     intended to diagnose MRSA     infection nor to guide or     monitor treatment for     MRSA infections.  CLOSTRIDIUM DIFFICILE BY PCR     Status: None   Collection Time    06/06/14 10:44 PM      Result Value Ref Range Status   C difficile by pcr NEGATIVE  NEGATIVE Final     Studies:  Recent x-ray studies have been reviewed in detail by the Attending Physician  Time spent :  83mins      Allison Ellis, ANP [facility] Office  [phone] Pager [phone]   **If unable to reach the above provider after paging please contact the [facility] @ [phone]  On-Call/Text Page:      [email or web address]      password [email or web address]  If 7PM-7AM, please contact night-coverage [email or web address] Password [email or web address] 06/07/2014, 1:54 PM   LOS: 1 day   I have personally examined this patient and reviewed the entire database. I have reviewed the above note, made any necessary editorial changes, and agree with its content.  Cherene Altes, MD [facility]

## 2014-06-07 NOTE — Procedures (Signed)
I was present at this dialysis session. I have reviewed the session itself and made appropriate changes.   Under EDW on our scales but BP up and SOB with bibasilar crackles and diminished elsewhere. UF Goal 3L.  For dyspnea will also have A/A neb with RT. Sats ok but inc WOB.   Pearson Grippe  MD 06/07/2014, 1:20 PM

## 2014-06-07 NOTE — Consult Note (Signed)
I have seen and examined the patient and agree with the assessment and plans. Given her overall medical health, nutrition, etc, she would need a resection and a temporary colostomy.  Will at least give her a trial of bowel rest and antibiotics, but surgery will more than likely be necessary.  Kouper Spinella A. Ninfa Linden  MD, FACS

## 2014-06-07 NOTE — Consult Note (Signed)
Reason for Consult: colovaginal fistula Referring Physician: Dr. 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  She was readmitted yesterday with HCAP and complaints of abdominal pain.  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  We have therefore been asked to evaluate the patient.  [name] reports abdominal pain over the last 3 months.  Symptoms have been intermittent associated with diarrhea.  Over the last 1.5 weeks she has developed feculent drainage from her vagina. She denies melena or hematochezia.  Reports weight loss over the last year.  Her appetite is poor. She has been on antibiotics in the recent month.    C diff by PCR was negative 9/8.  She has a white count white is down to 11.8K.  She been on Vancomycin for treatment of pneumonia.  The patient had a colonoscopy by Dr. Watt Climes in April of 2014 which showed significant sigmoid diverticulosis, mild transverse and descending.  A CT of abdomen and pelvis showed mild sigmoid diverticulitis and a colovaginal fistula.     Past Medical History  Diagnosis Date  . Carotid artery occlusion     bilat CEA in 2012  . C. 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 Hypertension   . Atrial fibrillation April  2013  . Pulmonary hypertension   . Secondary hyperparathyroidism, renal   . COPD (chronic obstructive pulmonary disease)   . Hypercholesterolemia   . History of MI (myocardial infarction)     "they say I had 2 years ago" (08/20/2012)  . GERD (gastroesophageal reflux disease)   . History of pneumonia     "have it alot" (08/20/2012)  . Sleep apnea     "don't wear mask anymore"  . Type II diabetes mellitus   . History of blood transfusion 2013    "3 times so far in  2013:  4 pints one time; 2 pints another; ?# last time" (08/20/2012)  . Iron deficiency anemia   . Seizures 2012    after carotid surgery  . ESRD on dialysis     "[facility]; Tues, Thurs; Sat"  . On home oxygen therapy     "24/7"   . Acute diverticulitis     Oct 2013, treated medically [facility], sigmoid diverticulitits  . CHF (congestive heart failure)   . Coronary artery disease   . Seizure disorder March 2011  . Myocardial infarction   . Renal cell carcinoma 2005?  Marland Kitchen Cancer of kidney 2005  . Renal insufficiency     Past Surgical History  Procedure Laterality Date  . Nephrectomy  2005    Right, for Renal cell carcinoma  . Carotid endarterectomy  2012    bilaterally; Dr. Kellie Simmering  . Av fistula placement  Jan. 7, 2011    Right  upper arm by Dr. Kellie Simmering  . Hd catheter  Aug. 22, 2013    [facility]  . Appendectomy      childhood  . Abdominal hysterectomy  1971?  Marland Kitchen Coronary angioplasty with stent placement  1990's    "1 total"  . Coronary angioplasty  1990's  . Av fistula repair  2013    right upper arm  . Esophagogastroduodenoscopy N/A 12/29/2012    Procedure: ESOPHAGOGASTRODUODENOSCOPY (EGD);  Surgeon: Altamese Dilling  Drema Balzarine, MD;  Location: [facility];  Service: Endoscopy;  Laterality: N/A;  . Colonoscopy with propofol N/A 12/31/2012    Procedure: COLONOSCOPY WITH PROPOFOL;  Surgeon: Jeryl Columbia, MD;  Location: [facility];  Service: Endoscopy;  Laterality: N/A;  currently IP at [facility] History  Problem Relation Age of Onset  . Cancer Mother     pancreatic  . Diabetes Mother   . Stroke Father   . Coronary artery disease Father   . Heart disease Father 13    Heart Disease before age 109  . Hyperlipidemia Father   . Hypertension Father   . Heart attack Father   . Cancer Brother     Brain  . Kidney disease Brother     Kidney stones  . Emphysema Father     smoked  . Pancreatic cancer Mother     Social History:  reports that she quit smoking about 10  years ago. Her smoking use included Cigarettes. She has a 67.5 pack-year smoking history. She has never used smokeless tobacco. She reports that she does not drink alcohol or use illicit drugs.  Allergies:  Allergies  Allergen Reactions  . Lipitor [Atorvastatin] Other (See Comments)    Causes weakness and drowsiness  . Morphine And Related Nausea And Vomiting  . Nsaids Other (See Comments)    Unknown reported by previous hospital    Medications:  Scheduled Meds: . sodium chloride   Intravenous Once  . amiodarone  200 mg Oral Daily  . antiseptic oral rinse  7 mL Mouth Rinse BID  . calcitRIOL  0.25 mcg Oral Q M,W,F  . calcium acetate  2,001 mg Oral TID WC  . cefTRIAXone (ROCEPHIN)  IV  1 g Intravenous Q24H  . darbepoetin (ARANESP) injection - DIALYSIS  120 mcg Intravenous Q Wed-HD  . enoxaparin (LOVENOX) injection  30 mg Subcutaneous Q24H  . insulin aspart  0-15 Units Subcutaneous TID WC  . levalbuterol  0.63 mg Nebulization Q6H  . metronidazole  500 mg Intravenous Q6H  . ondansetron (ZOFRAN) IV  4 mg Intravenous 4 times per day  . rOPINIRole  1 mg Oral BID  . sodium chloride  3 mL Intravenous Q12H  . vancomycin  125 mg Oral 4 times per day   Continuous Infusions: . dextrose 5 % and 0.9 % NaCl with KCl 20 mEq/L 75 mL/hr at 06/07/14 1100   PRN Meds:.acetaminophen, morphine injection, ondansetron (ZOFRAN) IV, ondansetron   Results for orders placed during the hospital encounter of 06/06/14 (from the past 48 hour(s))  I-STAT TROPOININ, ED     Status: None   Collection Time    06/06/14  4:18 PM      Result Value Ref Range   Troponin i, poc 0.08  0.00 - 0.08 ng/mL   Comment 3            Comment: Due to the release kinetics of cTnI,     a negative result within the first hours     of the onset of symptoms does not rule out     myocardial infarction with certainty.     If myocardial infarction is still suspected,     repeat the test at appropriate intervals.  CBC WITH  DIFFERENTIAL     Status: Abnormal   Collection Time    06/06/14  4:20 PM      Result Value Ref Range   WBC 12.9 (*) 4.0 - 10.5 K/uL   RBC  3.12 (*) 3.87 - 5.11 MIL/uL   Hemoglobin 8.7 (*) 12.0 - 15.0 g/dL   HCT 30.6 (*) 36.0 - 46.0 %   MCV 98.1  78.0 - 100.0 fL   MCH 27.9  26.0 - 34.0 pg   MCHC 28.4 (*) 30.0 - 36.0 g/dL   RDW 17.8 (*) 11.5 - 15.5 %   Platelets 210  150 - 400 K/uL   Neutrophils Relative % 83 (*) 43 - 77 %   Neutro Abs 10.7 (*) 1.7 - 7.7 K/uL   Lymphocytes Relative 8 (*) 12 - 46 %   Lymphs Abs 1.0  0.7 - 4.0 K/uL   Monocytes Relative 6  3 - 12 %   Monocytes Absolute 0.8  0.1 - 1.0 K/uL   Eosinophils Relative 3  0 - 5 %   Eosinophils Absolute 0.3  0.0 - 0.7 K/uL   Basophils Relative 0  0 - 1 %   Basophils Absolute 0.1  0.0 - 0.1 K/uL  I-STAT CHEM 8, ED     Status: Abnormal   Collection Time    06/06/14  4:20 PM      Result Value Ref Range   Sodium 132 (*) 137 - 147 mEq/L   Potassium 4.7  3.7 - 5.3 mEq/L   Chloride 97  96 - 112 mEq/L   BUN 27 (*) 6 - 23 mg/dL   Creatinine, Ser 5.20 (*) 0.50 - 1.10 mg/dL   Glucose, Bld 103 (*) 70 - 99 mg/dL   Calcium, Ion 1.01 (*) 1.13 - 1.30 mmol/L   TCO2 31  0 - 100 mmol/L   Hemoglobin 10.2 (*) 12.0 - 15.0 g/dL   HCT 30.0 (*) 36.0 - 46.0 %  PRO B NATRIURETIC PEPTIDE     Status: Abnormal   Collection Time    06/06/14  4:20 PM      Result Value Ref Range   Pro B Natriuretic peptide (BNP) >70000.0 (*) 0 - 125 pg/mL   Comment: REPEATED TO VERIFY  COMPREHENSIVE METABOLIC PANEL     Status: Abnormal   Collection Time    06/06/14  4:20 PM      Result Value Ref Range   Sodium 135 (*) 137 - 147 mEq/L   Potassium 5.0  3.7 - 5.3 mEq/L   Chloride 93 (*) 96 - 112 mEq/L   CO2 29  19 - 32 mEq/L   Glucose, Bld 101 (*) 70 - 99 mg/dL   BUN 28 (*) 6 - 23 mg/dL   Creatinine, Ser 5.07 (*) 0.50 - 1.10 mg/dL   Calcium 8.3 (*) 8.4 - 10.5 mg/dL   Total Protein 5.8 (*) 6.0 - 8.3 g/dL   Albumin 2.3 (*) 3.5 - 5.2 g/dL   AST 15  0 - 37 U/L    ALT 11  0 - 35 U/L   Alkaline Phosphatase 95  39 - 117 U/L   Total Bilirubin 0.3  0.3 - 1.2 mg/dL   GFR calc non Af Amer 8 (*) >90 mL/min   GFR calc Af Amer 9 (*) >90 mL/min   Comment: (NOTE)     The eGFR has been calculated using the CKD EPI equation.     This calculation has not been validated in all clinical situations.     eGFR's persistently <90 mL/min signify possible Chronic Kidney     Disease.   Anion gap 13  5 - 15  I-STAT CG4 LACTIC ACID, ED     Status: None   Collection  Time    06/06/14  4:21 PM      Result Value Ref Range   Lactic Acid, Venous 0.63  0.5 - 2.2 mmol/L  URINALYSIS, ROUTINE W REFLEX MICROSCOPIC     Status: Abnormal   Collection Time    06/06/14  4:39 PM      Result Value Ref Range   Color, Urine YELLOW  YELLOW   APPearance CLEAR  CLEAR   Specific Gravity, Urine 1.012  1.005 - 1.030   pH 7.0  5.0 - 8.0   Glucose, UA 100 (*) NEGATIVE mg/dL   Hgb urine dipstick TRACE (*) NEGATIVE   Bilirubin Urine NEGATIVE  NEGATIVE   Ketones, ur NEGATIVE  NEGATIVE mg/dL   Protein, ur >300 (*) NEGATIVE mg/dL   Urobilinogen, UA 0.2  0.0 - 1.0 mg/dL   Nitrite NEGATIVE  NEGATIVE   Leukocytes, UA NEGATIVE  NEGATIVE  URINE MICROSCOPIC-ADD ON     Status: Abnormal   Collection Time    06/06/14  4:39 PM      Result Value Ref Range   Squamous Epithelial / LPF FEW (*) RARE   WBC, UA 0-2  <3 WBC/hpf   RBC / HPF 0-2  <3 RBC/hpf   Bacteria, UA RARE  RARE   Casts HYALINE CASTS (*) NEGATIVE  CBC     Status: Abnormal   Collection Time    06/06/14  5:10 PM      Result Value Ref Range   WBC 13.3 (*) 4.0 - 10.5 K/uL   RBC 3.08 (*) 3.87 - 5.11 MIL/uL   Hemoglobin 8.6 (*) 12.0 - 15.0 g/dL   HCT 30.4 (*) 36.0 - 46.0 %   MCV 98.7  78.0 - 100.0 fL   MCH 27.9  26.0 - 34.0 pg   MCHC 28.3 (*) 30.0 - 36.0 g/dL   RDW 17.7 (*) 11.5 - 15.5 %   Platelets 212  150 - 400 K/uL  CREATININE, SERUM     Status: Abnormal   Collection Time    06/06/14  5:10 PM      Result Value Ref Range    Creatinine, Ser 5.21 (*) 0.50 - 1.10 mg/dL   GFR calc non Af Amer 7 (*) >90 mL/min   GFR calc Af Amer 9 (*) >90 mL/min   Comment: (NOTE)     The eGFR has been calculated using the CKD EPI equation.     This calculation has not been validated in all clinical situations.     eGFR's persistently <90 mL/min signify possible Chronic Kidney     Disease.  TSH     Status: None   Collection Time    06/06/14  5:13 PM      Result Value Ref Range   TSH 1.910  0.350 - 4.500 uIU/mL  TROPONIN I     Status: None   Collection Time    06/06/14  5:13 PM      Result Value Ref Range   Troponin I <0.30  <0.30 ng/mL   Comment:            Due to the release kinetics of cTnI,     a negative result within the first hours     of the onset of symptoms does not rule out     myocardial infarction with certainty.     If myocardial infarction is still suspected,     repeat the test at appropriate intervals.  MRSA PCR SCREENING     Status: None  Collection Time    06/06/14  9:16 PM      Result Value Ref Range   MRSA by PCR NEGATIVE  NEGATIVE   Comment:            The GeneXpert MRSA Assay (FDA     approved for NASAL specimens     only), is one component of a     comprehensive MRSA colonization     surveillance program. It is not     intended to diagnose MRSA     infection nor to guide or     monitor treatment for     MRSA infections.  GLUCOSE, CAPILLARY     Status: None   Collection Time    06/06/14  9:54 PM      Result Value Ref Range   Glucose-Capillary 75  70 - 99 mg/dL  CLOSTRIDIUM DIFFICILE BY PCR     Status: None   Collection Time    06/06/14 10:44 PM      Result Value Ref Range   C difficile by pcr NEGATIVE  NEGATIVE  TROPONIN I     Status: None   Collection Time    06/07/14 12:13 AM      Result Value Ref Range   Troponin I <0.30  <0.30 ng/mL   Comment:            Due to the release kinetics of cTnI,     a negative result within the first hours     of the onset of symptoms does not  rule out     myocardial infarction with certainty.     If myocardial infarction is still suspected,     repeat the test at appropriate intervals.  COMPREHENSIVE METABOLIC PANEL     Status: Abnormal   Collection Time    06/07/14 12:13 AM      Result Value Ref Range   Sodium 135 (*) 137 - 147 mEq/L   Potassium 4.6  3.7 - 5.3 mEq/L   Chloride 93 (*) 96 - 112 mEq/L   CO2 29  19 - 32 mEq/L   Glucose, Bld 133 (*) 70 - 99 mg/dL   BUN 32 (*) 6 - 23 mg/dL   Creatinine, Ser 5.59 (*) 0.50 - 1.10 mg/dL   Calcium 8.1 (*) 8.4 - 10.5 mg/dL   Total Protein 5.3 (*) 6.0 - 8.3 g/dL   Albumin 2.1 (*) 3.5 - 5.2 g/dL   AST 13  0 - 37 U/L   ALT 9  0 - 35 U/L   Alkaline Phosphatase 84  39 - 117 U/L   Total Bilirubin 0.2 (*) 0.3 - 1.2 mg/dL   GFR calc non Af Amer 7 (*) >90 mL/min   GFR calc Af Amer 8 (*) >90 mL/min   Comment: (NOTE)     The eGFR has been calculated using the CKD EPI equation.     This calculation has not been validated in all clinical situations.     eGFR's persistently <90 mL/min signify possible Chronic Kidney     Disease.   Anion gap 13  5 - 15  CBC     Status: Abnormal   Collection Time    06/07/14 12:13 AM      Result Value Ref Range   WBC 11.8 (*) 4.0 - 10.5 K/uL   RBC 2.78 (*) 3.87 - 5.11 MIL/uL   Hemoglobin 7.9 (*) 12.0 - 15.0 g/dL   HCT 27.6 (*) 36.0 - 46.0 %  MCV 99.3  78.0 - 100.0 fL   MCH 28.4  26.0 - 34.0 pg   MCHC 28.6 (*) 30.0 - 36.0 g/dL   RDW 17.6 (*) 11.5 - 15.5 %   Platelets 235  150 - 400 K/uL  GLUCOSE, CAPILLARY     Status: None   Collection Time    06/07/14  8:18 AM      Result Value Ref Range   Glucose-Capillary 98  70 - 99 mg/dL  TYPE AND SCREEN     Status: None   Collection Time    06/07/14 10:00 AM      Result Value Ref Range   ABO/RH(D) O POS     Antibody Screen NEG     Sample Expiration 06/10/2014     Unit Number [id number]     Blood Component Type RED CELLS,LR     Unit division 00     Status of Unit ALLOCATED     Transfusion Status  OK TO TRANSFUSE     Crossmatch Result Compatible     Unit Number [id number]     Blood Component Type RED CELLS,LR     Unit division 00     Status of Unit ALLOCATED     Transfusion Status OK TO TRANSFUSE     Crossmatch Result Compatible    PREPARE RBC (CROSSMATCH)     Status: None   Collection Time    06/07/14 10:00 AM      Result Value Ref Range   Order Confirmation ORDER PROCESSED BY BLOOD BANK      Ct Angio Chest Pe W/cm &/or Wo Cm  06/06/2014   CLINICAL DATA:  Shortness of breath, evaluate for pneumonia or kidney.  EXAM: CT ANGIOGRAPHY CHEST  CT ABDOMEN AND PELVIS WITH CONTRAST  TECHNIQUE: Multidetector CT imaging of the chest was performed using the standard protocol during bolus administration of intravenous contrast. Multiplanar CT image reconstructions and MIPs were obtained to evaluate the vascular anatomy. Multidetector CT imaging of the abdomen and pelvis was performed using the standard protocol during bolus administration of intravenous contrast.  CONTRAST:  111mL OMNIPAQUE IOHEXOL 350 MG/ML SOLN  COMPARISON:  CT abdomen pelvis dated 05/28/2014.  FINDINGS: CTA CHEST FINDINGS  No evidence of pulmonary embolism.  Interlobular septal thickening, upper lobe predominant, suspicious for mild interstitial edema. Associated cardiomegaly with small bilateral pleural effusions, right greater than left.  Patchy right lower lobe opacity (series 4/ image 67), atelectasis versus pneumonia. Lingular and left lower lobe opacity, likely atelectasis.  No suspicious pulmonary nodules. Underlying moderate centrilobular and paraseptal emphysematous changes. No pneumothorax.  Visualized right thyroid is mildly nodular.  Coronary atherosclerosis. Atherosclerotic calcifications of the aortic arch.  Thoracic lymphadenopathy, including:  --10 mm short axis right paratracheal node (series 4/ image 28)  --12 mm short axis prevascular node (series 4/ image 35)  --14 mm short axis right paratracheal node (series 4/  image 38)  --16 mm short axis left hilar node (series 4/image 50)  --15 mm short axis subcarinal node (series 4/ image 51)  --11 mm short axis right perihilar node (series 4/ image 52)  Degenerative changes of the visualized thoracic spine.  CT ABDOMEN and PELVIS FINDINGS  Liver is notable for a 1.7 x 1.1 cm hypoenhancing lesion in the lateral segment left hepatic lobe (series 09/BDZHG 20), of uncertain etiology, but likely present on unenhanced CT from 2013.  Spleen is within the upper limits of normal for size.  Pancreas and right adrenal gland are within normal  limits. Mild nodular thickening of the left adrenal gland, without discrete mass.  Gallbladder is mildly distended, without associated inflammatory changes. No intrahepatic or extrahepatic ductal dilatation.  Status post a right nephrectomy. 2.1 cm lateral interpolar left renal cyst (series 11/ image 38). Additional small left renal cyst. No hydronephrosis.  No evidence of bowel obstruction. Colonic diverticulosis, particularly involving the sigmoid colon. Mild pericolonic stranding/fluid along the distal sigmoid colon (series 11/image 74), raising the possibility of mild sigmoid diverticulitis. No drainable fluid collection/abscess. No free air.  Associated patent fistulous track between sigmoid colon and vaginal cuff in this patient status post hysterectomy (series 11/ images 75-80).  No adnexal masses.  Bladder is mildly thick-walled but underdistended.  Atherosclerotic calcifications of the abdominal aorta and branch vessels.  No abdominopelvic ascites.  No suspicious abdominopelvic lymphadenopathy.  Mild degenerative changes of the lumbar spine.  Review of the MIP images confirms the above findings.  IMPRESSION: No evidence of pulmonary embolism.  Cardiomegaly with mild interstitial edema and small bilateral pleural effusions, right greater than left.  Patchy right lower lobe opacity, atelectasis versus pneumonia. Lingular and left lower lobe opacity,  likely atelectasis.  Thoracic lymphadenopathy, possibly reactive, although indeterminate.  Possible mild sigmoid diverticulitis. No drainable fluid collection/abscess. No free air.  Associated patent fistulous track between the sigmoid colon and vaginal cuff.   Electronically Signed   By: Julian Hy M.D.   On: 06/06/2014 21:02   Ct Abdomen Pelvis W Contrast  06/06/2014   CLINICAL DATA:  Shortness of breath, evaluate for pneumonia or kidney.  EXAM: CT ANGIOGRAPHY CHEST  CT ABDOMEN AND PELVIS WITH CONTRAST  TECHNIQUE: Multidetector CT imaging of the chest was performed using the standard protocol during bolus administration of intravenous contrast. Multiplanar CT image reconstructions and MIPs were obtained to evaluate the vascular anatomy. Multidetector CT imaging of the abdomen and pelvis was performed using the standard protocol during bolus administration of intravenous contrast.  CONTRAST:  190m OMNIPAQUE IOHEXOL 350 MG/ML SOLN  COMPARISON:  CT abdomen pelvis dated 05/28/2014.  FINDINGS: CTA CHEST FINDINGS  No evidence of pulmonary embolism.  Interlobular septal thickening, upper lobe predominant, suspicious for mild interstitial edema. Associated cardiomegaly with small bilateral pleural effusions, right greater than left.  Patchy right lower lobe opacity (series 4/ image 67), atelectasis versus pneumonia. Lingular and left lower lobe opacity, likely atelectasis.  No suspicious pulmonary nodules. Underlying moderate centrilobular and paraseptal emphysematous changes. No pneumothorax.  Visualized right thyroid is mildly nodular.  Coronary atherosclerosis. Atherosclerotic calcifications of the aortic arch.  Thoracic lymphadenopathy, including:  --10 mm short axis right paratracheal node (series 4/ image 28)  --12 mm short axis prevascular node (series 4/ image 35)  --14 mm short axis right paratracheal node (series 4/ image 38)  --16 mm short axis left hilar node (series 4/image 50)  --15 mm short axis  subcarinal node (series 4/ image 51)  --11 mm short axis right perihilar node (series 4/ image 52)  Degenerative changes of the visualized thoracic spine.  CT ABDOMEN and PELVIS FINDINGS  Liver is notable for a 1.7 x 1.1 cm hypoenhancing lesion in the lateral segment left hepatic lobe (series 138/HWEXH20), of uncertain etiology, but likely present on unenhanced CT from 2013.  Spleen is within the upper limits of normal for size.  Pancreas and right adrenal gland are within normal limits. Mild nodular thickening of the left adrenal gland, without discrete mass.  Gallbladder is mildly distended, without associated inflammatory changes. No intrahepatic or extrahepatic  ductal dilatation.  Status post a right nephrectomy. 2.1 cm lateral interpolar left renal cyst (series 11/ image 38). Additional small left renal cyst. No hydronephrosis.  No evidence of bowel obstruction. Colonic diverticulosis, particularly involving the sigmoid colon. Mild pericolonic stranding/fluid along the distal sigmoid colon (series 11/image 74), raising the possibility of mild sigmoid diverticulitis. No drainable fluid collection/abscess. No free air.  Associated patent fistulous track between sigmoid colon and vaginal cuff in this patient status post hysterectomy (series 11/ images 75-80).  No adnexal masses.  Bladder is mildly thick-walled but underdistended.  Atherosclerotic calcifications of the abdominal aorta and branch vessels.  No abdominopelvic ascites.  No suspicious abdominopelvic lymphadenopathy.  Mild degenerative changes of the lumbar spine.  Review of the MIP images confirms the above findings.  IMPRESSION: No evidence of pulmonary embolism.  Cardiomegaly with mild interstitial edema and small bilateral pleural effusions, right greater than left.  Patchy right lower lobe opacity, atelectasis versus pneumonia. Lingular and left lower lobe opacity, likely atelectasis.  Thoracic lymphadenopathy, possibly reactive, although  indeterminate.  Possible mild sigmoid diverticulitis. No drainable fluid collection/abscess. No free air.  Associated patent fistulous track between the sigmoid colon and vaginal cuff.   Electronically Signed   By: Julian Hy M.D.   On: 06/06/2014 21:02   Dg Chest Portable 1 View  06/06/2014   CLINICAL DATA:  Abdominal pain and shortness of breath  EXAM: PORTABLE CHEST - 1 VIEW  COMPARISON:  05/27/2014  FINDINGS: Cardiac shadow is stable in appearance. Increasing infiltrate is noted in the medial right lung base as well as in the lateral left lung base. Diffuse interstitial changes remain with mild interstitial edema.  IMPRESSION: Increasing bibasilar infiltrates.   Electronically Signed   By: Inez Catalina M.D.   On: 06/06/2014 16:03    Review of Systems  Constitutional: Positive for weight loss. Negative for fever, chills, malaise/fatigue and diaphoresis.  Eyes: Negative for blurred vision.  Respiratory: Positive for shortness of breath. Negative for cough, hemoptysis and wheezing.   Cardiovascular: Negative for chest pain, palpitations, claudication, leg swelling and PND.  Gastrointestinal: Positive for abdominal pain and diarrhea. Negative for nausea, vomiting, constipation, blood in stool and melena.  Neurological: Negative for dizziness, tingling, tremors, loss of consciousness, weakness and headaches.  Psychiatric/Behavioral: Negative.    Blood pressure 168/76, pulse 90, temperature 98.5 F (36.9 C), temperature source Oral, resp. rate 26, height _0  (1.626 m), weight 160 lb (72.576 kg), SpO2 95.00%. Physical Exam  Constitutional: She is oriented to person, place, and time. She appears well-developed and well-nourished. No distress.  Neck: Normal range of motion. Neck supple.  Cardiovascular: Normal rate, regular rhythm, normal heart sounds and intact distal pulses.  Exam reveals no gallop and no friction rub.   No murmur heard. Respiratory: Effort normal and breath sounds normal.  She has no wheezes.  GI: Soft. Bowel sounds are normal. She exhibits no distension and no mass. There is no rebound and no guarding.  TTP RLQ  Musculoskeletal: Normal range of motion. She exhibits no edema and no tenderness.  Neurological: She is alert and oriented to person, place, and time.  Skin: Skin is warm and dry. No rash noted. She is not diaphoretic. No erythema. No pallor.  Psychiatric: She has a normal mood and affect. Her behavior is normal. Judgment and thought content normal.    Assessment/Plan: Anemia ESRD/HD CAD/Atrial fibrillation COPD on home 02 HCAP-on [facility] DM Hx C diff colitis--negative 9/8  Mild sigmoid diverticulitis  Colovaginal  fistula  she will need surgery, but this is non urgent.  She is at an increased risk of requiring a colostomy given her anemia, renal disease, PNA and diverticulitis.  Furthermore, she is at an increased risk of surgical complications including but not limited to respiratory failure given her COPD and active infection.  Therefore we would like to optimize her medically before proceeding with any  surgical interventions.  We recommend starting TPN.  I will add rocephin and flagyl.  Continue with bowel rest.   We will follow her clinically.  This was discussed with the patient and her daughter.  Thank you for the consult. We will follow along.   Charee Tumblin ANP-BC 06/07/2014, 11:43 AM

## 2014-06-07 NOTE — Care Management Note (Addendum)
    Page 1 of 1   06/09/2014     9:16:01 AM CARE MANAGEMENT NOTE 06/09/2014  Patient:  Blasko,Shivon A   Account Number:  [id number]  Date Initiated:  06/07/2014  Documentation initiated by:  Elissa Hefty  Subjective/Objective Assessment:   adm w resp failure     Action/Plan:   lives w fam, pcp dr Gilford Rile   Anticipated DC Date:     Anticipated DC Plan:           Choice offered to / List presented to:             Status of service:   Medicare Important Message given?  YES (If response is "NO", the following Medicare IM given date fields will be blank) Date Medicare IM given:  06/09/2014 Medicare IM given by:  Elissa Hefty Date Additional Medicare IM given:   Additional Medicare IM given by:    Discharge Disposition:    Per UR Regulation:  Reviewed for med. necessity/level of care/duration of stay  If discussed at [place] of Stay Meetings, dates discussed:    Comments:

## 2014-06-07 NOTE — Progress Notes (Signed)
Pt admitted from ED. Pt SOB upon exertion. On 4L [place], NSR. Pt had multiple episodes of diarrhea before coming to unit. Nurse initiated protocol for C Diff done; awaiting C Diff results. Pt does have hx of C diff from last admission on 8/24. Will continue to monitor.

## 2014-06-07 NOTE — Consult Note (Signed)
[place] 06/07/2014 Rexene Agent Requesting Physician:  Thereasa Solo  Reason for Consult:  ESRD MWF, C dificile colitis, rectovaginal fistula HPI:  73F ESRD MWF [place] via AVF and recent admissions in August of this year x2 for diverticulitis treated with IV antibiotics and subsequent Clostridium difficile colitis and healthcare acquired pneumonia. She was last discharged 05/18/2014 on an outpatient regimen of oral vancomycin, oral Ceftin. She returned to the ER yesterday with lower bowel pain, feculent discharge from her vagina, shortness of breath. 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  Since her last discharge she has attended all dialysis treatments. Her target weight is 72 kg but has left underbite 0.2-1.4 kg. She has received full treatments.  Since admission she has been continued on oral vancomycin. She has not started any new antibiotics.   Filed Weights   06/06/14 1518  Weight: 72.576 kg (160 lb)    I/O last 3 completed shifts: In: 63 [P.O.:340] Out: -   ROS Balance of 12 systems is negative w/ exceptions as above  Outpt HD Orders Unit: [facility] Days: MWF Time: 4h Dialyzer: F160 EDW: 72,g K/Ca: 22.25 Access: RUA AVF Needle Size: 15g BFR/DFR: 400/A1.5 UF Proflie: Profile 2 VDRA: Calcitriol 0.20mcg PO qMWF EPO: 120mg  Aranesp qWk IV Fe: none Heparin: none Most Recent Phos / PTH: 4.4 / 384 on 8/31 and 7/24 Most Recent TSAT: 30 Most Recent eKT/V: 1.46  PMH  Past Medical History  Diagnosis Date  . Carotid artery occlusion     bilat CEA in 2012  . C. 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 Hypertension   . Atrial fibrillation April  2013  . Pulmonary hypertension   . Secondary hyperparathyroidism, renal   . COPD (chronic obstructive pulmonary disease)   . Hypercholesterolemia   . History of MI (myocardial infarction)     "they say I had 2 years ago" (08/20/2012)  . GERD (gastroesophageal reflux disease)   .  History of pneumonia     "have it alot" (08/20/2012)  . Sleep apnea     "don't wear mask anymore"  . Type II diabetes mellitus   . History of blood transfusion 2013    "3 times so far in 2013:  4 pints one time; 2 pints another; ?# last time" (08/20/2012)  . Iron deficiency anemia   . Seizures 2012    after carotid surgery  . ESRD on dialysis     "[facility]; Tues, Thurs; Sat"  . On home oxygen therapy     "24/7"   . Acute diverticulitis     Oct 2013, treated medically [facility], sigmoid diverticulitits  . CHF (congestive heart failure)   . Coronary artery disease   . Seizure disorder March 2011  . Myocardial infarction   . Renal cell carcinoma 2005?  Marland Kitchen Cancer of kidney 2005  . Renal insufficiency    PSH  Past Surgical History  Procedure Laterality Date  . Nephrectomy  2005    Right, for Renal cell carcinoma  . Carotid endarterectomy  2012    bilaterally; Dr. Kellie Simmering  . Av fistula placement  Jan. 7, 2011    Right  upper arm by Dr. Kellie Simmering  . Hd catheter  Aug. 22, 2013    [facility]  . Appendectomy      childhood  . Abdominal hysterectomy  1971?  Marland Kitchen Coronary angioplasty with stent placement  1990's    "1 total"  . Coronary angioplasty  1990's  .  Av fistula repair  2013    right upper arm  . Esophagogastroduodenoscopy N/A 12/29/2012    Procedure: ESOPHAGOGASTRODUODENOSCOPY (EGD);  Surgeon: Jeryl Columbia, MD;  Location: [facility] ENDOSCOPY;  Service: Endoscopy;  Laterality: N/A;  . Colonoscopy with propofol N/A 12/31/2012    Procedure: COLONOSCOPY WITH PROPOFOL;  Surgeon: Jeryl Columbia, MD;  Location: [facility] ENDOSCOPY;  Service: Endoscopy;  Laterality: N/A;  currently IP at [place]   [place]  Family History  Problem Relation Age of Onset  . Cancer Mother     pancreatic  . Diabetes Mother   . Stroke Father   . Coronary artery disease Father   . Heart disease Father 87    Heart Disease before age 4  . Hyperlipidemia Father   . Hypertension Father   . Heart attack Father    . Cancer Brother     Brain  . Kidney disease Brother     Kidney stones  . Emphysema Father     smoked  . Pancreatic cancer Mother    SH  reports that she quit smoking about 10 years ago. Her smoking use included Cigarettes. She has a 67.5 pack-year smoking history. She has never used smokeless tobacco. She reports that she does not drink alcohol or use illicit drugs. Allergies  Allergies  Allergen Reactions  . Lipitor [Atorvastatin] Other (See Comments)    Causes weakness and drowsiness  . Morphine And Related Nausea And Vomiting  . Nsaids Other (See Comments)    Unknown reported by previous hospital   Home medications Prior to Admission medications   Medication Sig Start Date End Date Taking? Authorizing Provider  amiodarone (PACERONE) 200 MG tablet Take 200 mg by mouth daily. 04/29/14   Historical Provider, MD  calcium acetate (PHOSLO) 667 MG capsule Take 2,001 mg by mouth 3 (three) times daily with meals.     Historical Provider, MD  cefUROXime (CEFTIN) 500 MG tablet Take 1 tablet (500 mg total) by mouth daily with supper. For 1 day 05/26/14   Domenic Polite, MD  dicyclomine (BENTYL) 10 MG capsule Take 10 mg by mouth daily as needed for spasms.    Historical Provider, MD  ezetimibe (ZETIA) 10 MG tablet Take 10 mg by mouth at bedtime.    Historical Provider, MD  fluconazole (DIFLUCAN) 100 MG tablet Take 50-100 mg by mouth daily. Takes 100mg  after dialysis Takes 50mg  on non dialysis days until completed    Historical Provider, MD  glipiZIDE (GLUCOTROL XL) 10 MG 24 hr tablet Take 10 mg by mouth daily as needed (for FSBS >150).    Historical Provider, MD  HYDROcodone-acetaminophen (NORCO/VICODIN) 5-325 MG per tablet Take 0.5 tablets by mouth every 6 (six) hours as needed for moderate pain.    Historical Provider, MD  ipratropium (ATROVENT HFA) 17 MCG/ACT inhaler Inhale 2 puffs into the lungs every 6 (six) hours as needed for wheezing.    Historical Provider, MD  metoCLOPramide (REGLAN) 5  MG tablet Take 5 mg by mouth 2 (two) times daily.    Historical Provider, MD  multivitamin (RENA-VIT) TABS tablet Take 1 tablet by mouth daily with lunch.    Historical Provider, MD  ondansetron (ZOFRAN-ODT) 4 MG disintegrating tablet Take 1 tablet (4 mg total) by mouth every 8 (eight) hours as needed for nausea or vomiting. 05/27/14   Everlene Balls, MD  rOPINIRole (REQUIP) 1 MG tablet Take 1 mg by mouth 2 (two) times daily.    Historical Provider, MD  vancomycin (VANCOCIN) 125 MG  capsule Take 1 capsule (125 mg total) by mouth 4 (four) times daily. For 16days 05/26/14   Domenic Polite, MD    Current Medications Scheduled Meds: . sodium chloride   Intravenous Once  . amiodarone  200 mg Oral Daily  . antiseptic oral rinse  7 mL Mouth Rinse BID  . enoxaparin (LOVENOX) injection  30 mg Subcutaneous Q24H  . insulin aspart  0-15 Units Subcutaneous TID WC  . levalbuterol  0.63 mg Nebulization Q6H  . ondansetron (ZOFRAN) IV  4 mg Intravenous 4 times per day  . rOPINIRole  1 mg Oral BID  . sodium chloride  3 mL Intravenous Q12H  . vancomycin  125 mg Oral 4 times per day   Continuous Infusions: . dextrose 5 % and 0.9 % NaCl with KCl 20 mEq/L 75 mL/hr at 06/07/14 0852   PRN Meds:.acetaminophen, morphine injection, ondansetron (ZOFRAN) IV, ondansetron  CBC  Recent Labs Lab 06/06/14 1620 06/06/14 1710 06/07/14 0013  WBC 12.9* 13.3* 11.8*  NEUTROABS 10.7*  --   --   HGB 8.7*  10.2* 8.6* 7.9*  HCT 30.6*  30.0* 30.4* 27.6*  MCV 98.1 98.7 99.3  PLT 210 212 794   Basic Metabolic Panel  Recent Labs Lab 06/06/14 1620 06/06/14 1710 06/07/14 0013  NA 132*  135*  --  135*  K 4.7  5.0  --  4.6  CL 97  93*  --  93*  CO2 29  --  29  GLUCOSE 103*  101*  --  133*  BUN 27*  28*  --  32*  CREATININE 5.20*  5.07* 5.21* 5.59*  CALCIUM 8.3*  --  8.1*    Physical Exam  Blood pressure 168/76, pulse 90, temperature 98.5 F (36.9 C), temperature source Oral, resp. rate 26, height 5\' 4"  (1.626  m), weight 72.576 kg (160 lb), SpO2 95.00%. GEN: NAD, frail appearing ENT: ENT, poor dentition EYES: EOMI CV: RRR PULM: distant BS, CTAB ABD: s/nt/nd SKIN: no rashes/lesions EXT:No LEE   A/P 73 year old female on chronic hemodialysis Monday Wednesday Friday at the [facility] via a right upper arm AV fistula presenting with ongoing Clostridium difficile colitis and a new finding of a rectovaginal fistula.  1. ESRD MWF at [facility] via RUA AVF: We will continue on maintenance Monday Wednesday Friday treatments. First target weight is 70.5 kg. No heparin given history of GI bleed. Recent fistulogram 825 for poor functiis in and a on however no intervention. 2. HTN/Vol: As above. Blood pressures have been stable 3. Anemia: Continue on weekly Aranesp, 120 mg. She is due today. Last iron studies were replete. 4. MBD: Outpatient calcium phosphorus and PTH are reasonable. No changes. Continue calcitriol. Continue binders. 5. Clostridium difficile colitis: per primary 6. Rectovaginal fistula: Per primary and consultants; use a different narcotic other than morphine in the setting of renal failure. 7. COPD 8. DM  Pearson Grippe MD 06/07/2014, 9:15 AM

## 2014-06-07 NOTE — ED Provider Notes (Signed)
Medical screening examination/treatment/procedure(s) were conducted as a shared visit with non-physician practitioner(s) and myself.  I personally evaluated the patient during the encounter.   EKG Interpretation   Date/Time:  Tuesday June 06 2014 15:10:09 EDT Ventricular Rate:  87 PR Interval:  189 QRS Duration: 156 QT Interval:  448 QTC Calculation: 539 R Axis:   -12 Text Interpretation:  Sinus rhythm Left bundle branch block No significant  change since last tracing Confirmed by Biridiana Twardowski  MD, Lennette Bihari ([id number]) on  06/06/2014 3:32:53 PM      Pt c/o chrnoic abd pain, diarrhea.  States similar symptoms to admission last month. States now it seems diarrhea coming from both vagina and rectum. Also notes intermittent cough/cough not resolved.  Chest w few basilar rales. abd soft nt. Labs. Imaging.   Mirna Mires, MD 06/07/14 [phone]

## 2014-06-07 NOTE — Progress Notes (Addendum)
MD on call  Paged about pt's Hgb of 7.9. Will continue to monitor.

## 2014-06-08 ENCOUNTER — Inpatient Hospital Stay (HOSPITAL_COMMUNITY): Payer: Medicare Other

## 2014-06-08 DIAGNOSIS — I4891 Unspecified atrial fibrillation: Secondary | ICD-10-CM

## 2014-06-08 DIAGNOSIS — R52 Pain, unspecified: Secondary | ICD-10-CM

## 2014-06-08 DIAGNOSIS — N186 End stage renal disease: Secondary | ICD-10-CM

## 2014-06-08 DIAGNOSIS — A0472 Enterocolitis due to Clostridium difficile, not specified as recurrent: Secondary | ICD-10-CM

## 2014-06-08 DIAGNOSIS — N321 Vesicointestinal fistula: Principal | ICD-10-CM

## 2014-06-08 DIAGNOSIS — E119 Type 2 diabetes mellitus without complications: Secondary | ICD-10-CM

## 2014-06-08 DIAGNOSIS — I2789 Other specified pulmonary heart diseases: Secondary | ICD-10-CM

## 2014-06-08 DIAGNOSIS — N824 Other female intestinal-genital tract fistulae: Secondary | ICD-10-CM

## 2014-06-08 DIAGNOSIS — G4733 Obstructive sleep apnea (adult) (pediatric): Secondary | ICD-10-CM

## 2014-06-08 DIAGNOSIS — Z992 Dependence on renal dialysis: Secondary | ICD-10-CM

## 2014-06-08 DIAGNOSIS — J9600 Acute respiratory failure, unspecified whether with hypoxia or hypercapnia: Secondary | ICD-10-CM

## 2014-06-08 DIAGNOSIS — R109 Unspecified abdominal pain: Secondary | ICD-10-CM

## 2014-06-08 LAB — CBC
HEMATOCRIT: 35.6 % — AB (ref 36.0–46.0)
HEMOGLOBIN: 10.7 g/dL — AB (ref 12.0–15.0)
MCH: 28.6 pg (ref 26.0–34.0)
MCHC: 30.1 g/dL (ref 30.0–36.0)
MCV: 95.2 fL (ref 78.0–100.0)
Platelets: 168 10*3/uL (ref 150–400)
RBC: 3.74 MIL/uL — ABNORMAL LOW (ref 3.87–5.11)
RDW: 19.3 % — AB (ref 11.5–15.5)
WBC: 10 10*3/uL (ref 4.0–10.5)

## 2014-06-08 LAB — TYPE AND SCREEN
ABO/RH(D): O POS
Antibody Screen: NEGATIVE
Unit division: 0
Unit division: 0

## 2014-06-08 LAB — PROTIME-INR
INR: 1.17 (ref 0.00–1.49)
PROTHROMBIN TIME: 14.9 s (ref 11.6–15.2)

## 2014-06-08 LAB — GLUCOSE, CAPILLARY
GLUCOSE-CAPILLARY: 87 mg/dL (ref 70–99)
Glucose-Capillary: 95 mg/dL (ref 70–99)
Glucose-Capillary: 93 mg/dL (ref 70–99)
Glucose-Capillary: 122 mg/dL — ABNORMAL HIGH (ref 70–99)

## 2014-06-08 LAB — COMPREHENSIVE METABOLIC PANEL
ALT: 9 U/L (ref 0–35)
ANION GAP: 13 (ref 5–15)
AST: 14 U/L (ref 0–37)
Albumin: 2.1 g/dL — ABNORMAL LOW (ref 3.5–5.2)
Alkaline Phosphatase: 87 U/L (ref 39–117)
BUN: 16 mg/dL (ref 6–23)
CALCIUM: 8.2 mg/dL — AB (ref 8.4–10.5)
CO2: 27 mEq/L (ref 19–32)
Chloride: 97 mEq/L (ref 96–112)
Creatinine, Ser: 3.72 mg/dL — ABNORMAL HIGH (ref 0.50–1.10)
GFR calc Af Amer: 13 mL/min — ABNORMAL LOW (ref >90)
GFR calc non Af Amer: 11 mL/min — ABNORMAL LOW (ref >90)
GLUCOSE: 88 mg/dL (ref 70–99)
Potassium: 4.6 mEq/L (ref 3.7–5.3)
Sodium: 137 mEq/L (ref 137–147)
TOTAL PROTEIN: 5.4 g/dL — AB (ref 6.0–8.3)
Total Bilirubin: 0.4 mg/dL (ref 0.3–1.2)

## 2014-06-08 LAB — PREALBUMIN: Prealbumin: 8.3 mg/dL — ABNORMAL LOW (ref 17.0–34.0)

## 2014-06-08 LAB — APTT: aPTT: 33 seconds (ref 24–37)

## 2014-06-08 MED ORDER — TRACE MINERALS CR-CU-F-FE-I-MN-MO-SE-ZN IV SOLN
INTRAVENOUS | Status: AC
  Administered 2014-06-08: 17:00:00 via INTRAVENOUS
  Filled 2014-06-08: qty 1000

## 2014-06-08 MED ORDER — LEVALBUTEROL HCL 0.63 MG/3ML IN NEBU
0.6300 mg | INHALATION_SOLUTION | Freq: Three times a day (TID) | RESPIRATORY_TRACT | Status: DC
  Administered 2014-06-08 – 2014-06-10 (×5): 0.63 mg via RESPIRATORY_TRACT
  Filled 2014-06-08 (×10): qty 3

## 2014-06-08 MED ORDER — FAT EMULSION 20 % IV EMUL
250.0000 mL | INTRAVENOUS | Status: AC
  Administered 2014-06-08: 250 mL via INTRAVENOUS
  Filled 2014-06-08: qty 250

## 2014-06-08 MED ORDER — DEXTROSE-NACL 5-0.45 % IV SOLN
INTRAVENOUS | Status: DC
  Administered 2014-06-08: 18:00:00 via INTRAVENOUS

## 2014-06-08 NOTE — Progress Notes (Signed)
PARENTERAL NUTRITION CONSULT NOTE - INITIAL  Pharmacy Consult for TPN Indication: Colovesical fistula   Allergies  Allergen Reactions  . Lipitor [Atorvastatin] Other (See Comments)    Causes weakness and drowsiness  . Morphine And Related Nausea And Vomiting  . Nsaids Other (See Comments)    Unknown reported by previous hospital    Patient Measurements: Height: 5\' 4"  (162.6 cm) Weight: 156 lb 12 oz (71.1 kg) IBW/kg (Calculated) : 54.7   Vital Signs: Temp: 98.7 F (37.1 C) (09/10 0750) Temp src: Oral (09/10 0750) BP: 137/44 mmHg (09/10 0600) Pulse Rate: 69 (09/10 0600) Intake/Output from previous day: 09/09 0701 - 09/10 0700 In: 1360 [I.V.:490; Blood:670; IV Piggyback:200] Out: 2488  Intake/Output from this shift:    Labs:  Recent Labs  06/06/14 1710 06/07/14 0013 06/08/14 0500  WBC 13.3* 11.8* 10.0  HGB 8.6* 7.9* 10.7*  HCT 30.4* 27.6* 35.6*  PLT 212 235 168     Recent Labs  06/06/14 1620 06/06/14 1710 06/07/14 0013 06/08/14 0500  NA 132*  135*  --  135* 137  K 4.7  5.0  --  4.6 4.6  CL 97  93*  --  93* 97  CO2 29  --  29 27  GLUCOSE 103*  101*  --  133* 88  BUN 27*  28*  --  32* 16  CREATININE 5.20*  5.07* 5.21* 5.59* 3.72*  CALCIUM 8.3*  --  8.1* 8.2*  PROT 5.8*  --  5.3* 5.4*  ALBUMIN 2.3*  --  2.1* 2.1*  AST 15  --  13 14  ALT 11  --  9 9  ALKPHOS 95  --  84 87  BILITOT 0.3  --  0.2* 0.4   Estimated Creatinine Clearance: 13.2 ml/min (by C-G formula based on Cr of 3.72).    Recent Labs  06/07/14 1239 06/07/14 2106 06/08/14 0748  GLUCAP 102* 87 87    Medical History: Past Medical History  Diagnosis Date  . Carotid artery occlusion     bilat CEA in 2012  . C. 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 Hypertension   . Atrial fibrillation April  2013  . Pulmonary hypertension   . Secondary hyperparathyroidism, renal   . COPD (chronic obstructive pulmonary disease)   . Hypercholesterolemia   . History of  MI (myocardial infarction)     "they say I had 2 years ago" (08/20/2012)  . GERD (gastroesophageal reflux disease)   . History of pneumonia     "have it alot" (08/20/2012)  . Sleep apnea     "don't wear mask anymore"  . Type II diabetes mellitus   . History of blood transfusion 2013    "3 times so far in 2013:  4 pints one time; 2 pints another; ?# last time" (08/20/2012)  . Iron deficiency anemia   . Seizures 2012    after carotid surgery  . ESRD on dialysis     "[place]; Tues, Thurs; Sat"  . On home oxygen therapy     "24/7"   . Acute diverticulitis     Oct 2013, treated medically [facility], sigmoid diverticulitits  . CHF (congestive heart failure)   . Coronary artery disease   . Seizure disorder March 2011  . Myocardial infarction   . Renal cell carcinoma 2005?  Marland Kitchen Cancer of kidney 2005  . Renal insufficiency     Medications:  Prescriptions prior to admission  Medication Sig Dispense Refill  .  amiodarone (PACERONE) 200 MG tablet Take 200 mg by mouth daily.      . calcium acetate (PHOSLO) 667 MG capsule Take 2,001 mg by mouth 3 (three) times daily with meals.       . cefUROXime (CEFTIN) 500 MG tablet Take 1 tablet (500 mg total) by mouth daily with supper. For 1 day  1 tablet  0  . dicyclomine (BENTYL) 10 MG capsule Take 10 mg by mouth daily as needed for spasms.      Marland Kitchen ezetimibe (ZETIA) 10 MG tablet Take 10 mg by mouth at bedtime.      . fluconazole (DIFLUCAN) 100 MG tablet Take 50-100 mg by mouth daily. Takes 100mg  after dialysis Takes 50mg  on non dialysis days until completed      . glipiZIDE (GLUCOTROL XL) 10 MG 24 hr tablet Take 10 mg by mouth daily as needed (for FSBS >150).      Marland Kitchen HYDROcodone-acetaminophen (NORCO/VICODIN) 5-325 MG per tablet Take 0.5 tablets by mouth every 6 (six) hours as needed for moderate pain.      Marland Kitchen ipratropium (ATROVENT HFA) 17 MCG/ACT inhaler Inhale 2 puffs into the lungs every 6 (six) hours as needed for wheezing.      .  metoCLOPramide (REGLAN) 5 MG tablet Take 5 mg by mouth 2 (two) times daily.      . multivitamin (RENA-VIT) TABS tablet Take 1 tablet by mouth daily with lunch.      . ondansetron (ZOFRAN-ODT) 4 MG disintegrating tablet Take 1 tablet (4 mg total) by mouth every 8 (eight) hours as needed for nausea or vomiting.  10 tablet  0  . rOPINIRole (REQUIP) 1 MG tablet Take 1 mg by mouth 2 (two) times daily.      . vancomycin (VANCOCIN) 125 MG capsule Take 1 capsule (125 mg total) by mouth 4 (four) times daily. For 16days  64 capsule  0    Insulin Requirements in the past 24 hours:  None   Assessment: 71 YOF who presented to the ED with colovesical fistula which was noted on CT. General surgery was consulted and they recommended bowel rest and antibiotics. Pt has h/o ESRD on dialysis    GI: Acute abdominal pain with colovesical fistula. Gen surg suspects patient will need resection with temporary colostomy. Endo: h/o DM, CBGs controlled, On moderate scale SSI  Lytes: Lytes wnl  Renal: ESRD on HD MWF per nephrology. Corr Ca 9.72 Pulm: COPD, 4 L [place]  Cards: HTN, Afib, HLD, BP 137/44, HR 69  Hepatobil: LFTs wnl  Neuro: h/o seizures;  ID:   On D#2 Rocephin, flagyl and oral vancomycin, WBC wnl, pt is afebrile  Best Practices: Heparin [place] TPN Access: PICC line to be placed today  TPN day#: 0    Nutritional Goals:  Per RD recommendations   Current Nutrition:  D5 1/2 NS @ 30 mL/hr   Plan:  -Start Clinimix 5/15 without electrolytes @ 40 mL/hr + IVFE @ 10 mL/hr. Proceed to goal as tolerated -Daily MVI and TE  -Decrease IV fluid rate to TKO to accommodate TPN  -Order TPN labs  -F/u RD recommendations  -Monitor electrolytes and CBGs carefully   Albertina Parr, PharmD.  Clinical Pharmacist Pager [phone]

## 2014-06-08 NOTE — Progress Notes (Signed)
[name]  [name] VEL:[id number] DOB: [birth year] DOA: 06/06/2014 PCP: Gilford Rile, MD  Brief narrative: 73 year old female patient with past history of afib not on chronic anticoagulation, COPD, diabetes, seizure disorder, chronic kidney disease on dialysis. She presented to the emergency department because of ongoing abdominal pain and increasing shortness of breath. Patient had recently been hospitalized and subsequently discharged on 8/6 for acute diverticulitis noted on CT scan. She was discharged on Cipro and Flagyl. She was readmitted on 8/24 were shortness of breath with subsequent diagnosed with C. difficile colitis and pneumonia. She was discharged on 8/29 on cefuroxime and oral vancomycin. She returned to the the ER on the same date. She was observed in the ER for 6 hours and was stable and had no indications for readmission so she was discharged from the ER.  She returns to the ER on 9/8 after noticing urine next is stool coming both from her vagina and rectum which was a new finding. She also reported increasing and worsening abdominal cramping with diarrhea. Also noticed increased shortness of breath for the past 24 hours. She reported an episode of diffuse anterior chest pain duration 30 minutes 24 hours prior to presentation but this has not recurred. She is on chronic oxygen at home at 3 L per minute. Upon their arrival her saturations were initially 67% on 3 L of oxygen. She was placed on 100% nonrebreather mask and saturations improved to 100%. Upon the admitting physician's exam the patient was stable on 4 L nasal cannula oxygen. In the ER her white count was 13,300 with a normal platelet count . She was afebrile.  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 Gen. surgery has been consulted. Nephrology is also following to manage her  dialysis.  HPI/Subjective: Less abdominal pain and vaginal dsicharge- prior fatigue and SOB resolved-multiple questions answered.  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 difficile colitis-appreciate Gen. surgery assistance-agree with current bowel rest and antibiotics- Surgery suspects will need resection with temporary colostomy next week- although NPO will need to be cautious with IV fluid hydration since she is a dialysis patient -9/10 surgery allowing clears-9/10 surgery rec TNA so I spoke with PCCM/Dr. Titus Mould who will place CL today; if they are unable to place Nephro says central (?midline) PICC ok.    C. difficile colitis -Continue oral vancomycin-most recent C. difficile PCR on 9/8 was negative    Acute respiratory failure with hypoxia/? HCAP versus volume overload -improved after HD-Continue empiric antibiotics and pulmonary toileting   Anemia with symptoms -post 2 units pRBCs 9/8- pt endorses prior SOB and fatigue/malaise resolved-baseline Hgb~10 with pre tx hgb 7.9- post tx up to 10.7     CKD (chronic kidney disease) stage V requiring chronic dialysis -Per nephrology-dialysis M/W/F    DM2 (diabetes mellitus, type 2) -CBGs well controlled on SSI-watch on TNA-may need insulin added to TNA    HTN (hypertension) -now controlled after HD and the addition of IV Lopressor   Paroxysmal atrial fibrillation -Continue by mouth amiodarone-cont beta blocker IV for hypertensive control as well    Chronic respiratory failure with hypoxia due to:   A) Pulmonary HTN   B) OSA (obstructive sleep apnea)   C) COPD (chronic obstructive pulmonary disease) -All appear compensated at this  point-on 3 L nasal cannula at home   DVT prophylaxis: Lovenox Code Status: Full Family Communication: No family at bedside Disposition Plan/Expected LOS: Step  down  Consultants: Nephrology General surgery PCCM for line placement  Procedures: CL pending  Cultures: 9/8 C. difficile negative - was positive 05/22/2014  Antibiotics: Oral vancomycin 8/23 >>>  Objective: Blood pressure 122/25, pulse 68, temperature 98.4 F (36.9 C), temperature source Oral, resp. rate 19, height 5\' 4"  (1.626 m), weight 156 lb 12 oz (71.1 kg), SpO2 97.00%.  Intake/Output Summary (Last 24 hours) at 06/08/14 1324 Last data filed at 06/08/14 1100  Gross per 24 hour  Intake   1790 ml  Output   2488 ml  Net   -698 ml   Exam: Gen: No acute respiratory distress Chest: Bilateral basilar crackles have decreased, 4 L Cardiac: Regular rate and rhythm, S1-S2, no rubs murmurs or gallops, no peripheral edema, no JVD Abdomen: Soft mildly tender over left abdomen nondistended without obvious hepatosplenomegaly, no ascites Extremities: Symmetrical in appearance without cyanosis, clubbing or effusion  Scheduled Meds:  Scheduled Meds: . sodium chloride   Intravenous Once  . albuterol  2.5 mg Nebulization Once  . amiodarone  200 mg Oral Daily  . antiseptic oral rinse  7 mL Mouth Rinse BID  . calcitRIOL  0.25 mcg Oral Q M,W,F  . cefTRIAXone (ROCEPHIN)  IV  1 g Intravenous Q24H  . darbepoetin (ARANESP) injection - DIALYSIS  120 mcg Intravenous Q Wed-HD  . heparin subcutaneous  5,000 Units Subcutaneous 3 times per day  . insulin aspart  0-15 Units Subcutaneous TID WC  . levalbuterol  0.63 mg Nebulization Q6H  . metoprolol  5 mg Intravenous Q4H  . metronidazole  500 mg Intravenous Q6H  . ondansetron (ZOFRAN) IV  4 mg Intravenous 4 times per day  . rOPINIRole  1 mg Oral BID  . sodium chloride  3 mL Intravenous Q12H  . vancomycin  125 mg Oral 4 times per day    Data Reviewed: Basic Metabolic Panel:  Recent Labs Lab 06/06/14 1620 06/06/14 1710 06/07/14 0013 06/08/14 0500  NA 132*  135*  --  135* 137  K 4.7  5.0  --  4.6 4.6  CL 97  93*  --  93* 97  CO2 29   --  29 27  GLUCOSE 103*  101*  --  133* 88  BUN 27*  28*  --  32* 16  CREATININE 5.20*  5.07* 5.21* 5.59* 3.72*  CALCIUM 8.3*  --  8.1* 8.2*   Liver Function Tests:  Recent Labs Lab 06/06/14 1620 06/07/14 0013 06/08/14 0500  AST 15 13 14   ALT 11 9 9   ALKPHOS 95 84 87  BILITOT 0.3 0.2* 0.4  PROT 5.8* 5.3* 5.4*  ALBUMIN 2.3* 2.1* 2.1*   CBC:  Recent Labs Lab 06/06/14 1620 06/06/14 1710 06/07/14 0013 06/08/14 0500  WBC 12.9* 13.3* 11.8* 10.0  NEUTROABS 10.7*  --   --   --   HGB 8.7*  10.2* 8.6* 7.9* 10.7*  HCT 30.6*  30.0* 30.4* 27.6* 35.6*  MCV 98.1 98.7 99.3 95.2  PLT 210 212 235 168   Cardiac Enzymes:  Recent Labs Lab 06/06/14 1713 06/07/14 0013  TROPONINI <0.30 <0.30   CBG:  Recent Labs Lab 06/07/14 0818 06/07/14 1239 06/07/14 2106 06/08/14 0748 06/08/14 1154  GLUCAP 98 102* 87 87 95    Recent Results (from the past 240 hour(s))  MRSA PCR SCREENING     Status:  None   Collection Time    06/06/14  9:16 PM      Result Value Ref Range Status   MRSA by PCR NEGATIVE  NEGATIVE Final   Comment:            The GeneXpert MRSA Assay (FDA     approved for NASAL specimens     only), is one component of a     comprehensive MRSA colonization     surveillance program. It is not     intended to diagnose MRSA     infection nor to guide or     monitor treatment for     MRSA infections.  CLOSTRIDIUM DIFFICILE BY PCR     Status: None   Collection Time    06/06/14 10:44 PM      Result Value Ref Range Status   C difficile by pcr NEGATIVE  NEGATIVE Final     Studies:  Recent x-ray studies have been reviewed in detail by the Attending Physician  Time spent :  68mins      Allison Ellis, ANP [facility] Office  [phone] Pager [phone]   **If unable to reach the above provider after paging please contact the [place] @ [phone]  On-Call/Text Page:      [email or web address]      password [email or web address]  If 7PM-7AM, please contact  night-coverage [email or web address] Password [email or web address] 06/08/2014, 1:24 PM   LOS: 2 days  Examined patient and discussed assessment and plan with ANP Ebony Hail and agree with the above plan. Patient with multiple complex medical problems> 40 minutes spent in direct patient care

## 2014-06-08 NOTE — Procedures (Signed)
Central Venous Catheter Insertion Procedure Note DAYZEE TROWER [id number] 1940/12/14  Procedure: Insertion of Central Venous Catheter Indications: Drug and/or fluid administration  Procedure Details Consent: Risks of procedure as well as the alternatives and risks of each were explained to the (patient/caregiver).  Consent for procedure obtained. Time Out: Verified patient identification, verified procedure, site/side was marked, verified correct patient position, special equipment/implants available, medications/allergies/relevent history reviewed, required imaging and test results available.  Performed  Maximum sterile technique was used including antiseptics, cap, gloves, gown, hand hygiene, mask and sheet. Skin prep: Chlorhexidine; local anesthetic administered A antimicrobial bonded/coated triple lumen catheter was placed in the right internal jugular vein using the Seldinger technique.  Evaluation Blood flow good Complications: No apparent complications Patient did tolerate procedure well. Chest X-ray ordered to verify placement.  CXR: pending.  Performed under direct MD supervision.  Performed using ultrasound guidance.  Wire visualized in vessel under ultrasound.   Placed by Georgann Housekeeper ACNP  I supervised procedure, tolerated well [place] gudiance NO IJ on left For tpn  Lavon Paganini. Titus Mould, MD, [place] Pgr: [facility]

## 2014-06-08 NOTE — Progress Notes (Signed)
[facility] Progress Note   Subjective: Abd pain and vag d/c much better, is npo , starting CL's today per surg, says they are planning on surgery next week  Filed Vitals:   06/08/14 0400 06/08/14 0600 06/08/14 0750 06/08/14 0855  BP: 142/36 137/44    Pulse: 65 69    Temp:   98.7 F (37.1 C)   TempSrc:   Oral   Resp: 21 26    Height:      Weight:      SpO2: 96% 97%  99%   Exam: Alert, no distress, sitting up in bed No jvd Chest dec'd BS throughout, mild rales bilat bases RRR no MRG Abd soft, NTND, +BS No LE edema RUA AVF +bruit, bruising  Neuro is nf, Ox 3  HD: MWF [place] 4h   72kg   2/2.25 [place]  400/A1.5  RUA AVF 15ga   Prof 2   Heparin none Calcitriol 0.25 ug po TIW, Aranesp 120 ug q week Pth 384, P 4.4, tsat 30, eKT/V 1.46  Assessment: 1 Abd pain / RV fistula - hx recent diverticulitis episode, on IV ab (rocephin, flagyl), per Gen surg planning on surg next week 2 C dificile infection on po vanc 3 ESRD on HD 4 HPTH resume phoslo when eating, cont vit D 5 Anemia s/p prbc x 2, cont aranesp 120/wk 6 DM on oral meds 7 Afib on amio, IV MTP while npo 8 COPD on continuous O2 at home and here 9 HTN/volume -stable, no BP meds, at dry wt  Plan- HD tomorrow, may need line for TNA > if so central PICC by IR is desired in ESRD pts    Kelly Splinter MD  pager [phone]    cell [phone]  06/08/2014, 10:53 AM     Recent Labs Lab 06/06/14 1620 06/06/14 1710 06/07/14 0013 06/08/14 0500  NA 132*  135*  --  135* 137  K 4.7  5.0  --  4.6 4.6  CL 97  93*  --  93* 97  CO2 29  --  29 27  GLUCOSE 103*  101*  --  133* 88  BUN 27*  28*  --  32* 16  CREATININE 5.20*  5.07* 5.21* 5.59* 3.72*  CALCIUM 8.3*  --  8.1* 8.2*    Recent Labs Lab 06/06/14 1620 06/07/14 0013 06/08/14 0500  AST 15 13 14   ALT 11 9 9   ALKPHOS 95 84 87  BILITOT 0.3 0.2* 0.4  PROT 5.8* 5.3* 5.4*  ALBUMIN 2.3* 2.1* 2.1*    Recent Labs Lab 06/06/14 1620 06/06/14 1710  06/07/14 0013 06/08/14 0500  WBC 12.9* 13.3* 11.8* 10.0  NEUTROABS 10.7*  --   --   --   HGB 8.7*  10.2* 8.6* 7.9* 10.7*  HCT 30.6*  30.0* 30.4* 27.6* 35.6*  MCV 98.1 98.7 99.3 95.2  PLT 210 212 235 168   . sodium chloride   Intravenous Once  . albuterol  2.5 mg Nebulization Once  . amiodarone  200 mg Oral Daily  . antiseptic oral rinse  7 mL Mouth Rinse BID  . calcitRIOL  0.25 mcg Oral Q M,W,F  . calcium acetate  2,001 mg Oral TID WC  . cefTRIAXone (ROCEPHIN)  IV  1 g Intravenous Q24H  . darbepoetin (ARANESP) injection - DIALYSIS  120 mcg Intravenous Q Wed-HD  . heparin subcutaneous  5,000 Units Subcutaneous 3 times per day  . insulin aspart  0-15 Units Subcutaneous TID WC  . levalbuterol  0.63 mg Nebulization Q6H  . metoprolol  5 mg Intravenous Q4H  . metronidazole  500 mg Intravenous Q6H  . ondansetron (ZOFRAN) IV  4 mg Intravenous 4 times per day  . rOPINIRole  1 mg Oral BID  . sodium chloride  3 mL Intravenous Q12H  . vancomycin  125 mg Oral 4 times per day   . dextrose 5 % and 0.45% NaCl 30 mL/hr at 06/07/14 2120  . dextrose 5 % and 0.45% NaCl    . TPN (CLINIMIX) Adult without lytes     And  . fat emulsion     acetaminophen, ipratropium, morphine injection, ondansetron (ZOFRAN) IV, ondansetron

## 2014-06-08 NOTE — Progress Notes (Signed)
Subjective: She denies any pain Reports vaginal drainage is less  Objective: Vital signs in last 24 hours: Temp:  [97.5 F (36.4 C)-98.7 F (37.1 C)] 98.7 F (37.1 C) (09/10 0750) Pulse Rate:  [65-116] 69 (09/10 0600) Resp:  [18-30] 26 (09/10 0600) BP: (98-196)/(34-99) 137/44 mmHg (09/10 0600) SpO2:  [93 %-100 %] 99 % (09/10 0855) Weight:  [156 lb 12 oz (71.1 kg)-162 lb 0.6 oz (73.5 kg)] 156 lb 12 oz (71.1 kg) (09/09 1715) Last BM Date: 06/06/14  Intake/Output from previous day: 09/09 0701 - 09/10 0700 In: 1360 [I.V.:490; Blood:670; IV Piggyback:200] Out: 2488  Intake/Output this shift:    Abdomen soft, non tender, non distended  Lab Results:   Recent Labs  06/07/14 0013 06/08/14 0500  WBC 11.8* 10.0  HGB 7.9* 10.7*  HCT 27.6* 35.6*  PLT 235 168   BMET  Recent Labs  06/07/14 0013 06/08/14 0500  NA 135* 137  K 4.6 4.6  CL 93* 97  CO2 29 27  GLUCOSE 133* 88  BUN 32* 16  CREATININE 5.59* 3.72*  CALCIUM 8.1* 8.2*   PT/INR No results found for this basename: LABPROT, INR,  in the last 72 hours ABG No results found for this basename: PHART, PCO2, PO2, HCO3,  in the last 72 hours  Studies/Results: Ct Angio Chest Pe W/cm &/or Wo Cm  06/06/2014   CLINICAL DATA:  Shortness of breath, evaluate for pneumonia or kidney.  EXAM: CT ANGIOGRAPHY CHEST  CT ABDOMEN AND PELVIS WITH CONTRAST  TECHNIQUE: Multidetector CT imaging of the chest was performed using the standard protocol during bolus administration of intravenous contrast. Multiplanar CT image reconstructions and MIPs were obtained to evaluate the vascular anatomy. Multidetector CT imaging of the abdomen and pelvis was performed using the standard protocol during bolus administration of intravenous contrast.  CONTRAST:  172mL OMNIPAQUE IOHEXOL 350 MG/ML SOLN  COMPARISON:  CT abdomen pelvis dated 05/28/2014.  FINDINGS: CTA CHEST FINDINGS  No evidence of pulmonary embolism.  Interlobular septal thickening, upper lobe  predominant, suspicious for mild interstitial edema. Associated cardiomegaly with small bilateral pleural effusions, right greater than left.  Patchy right lower lobe opacity (series 4/ image 67), atelectasis versus pneumonia. Lingular and left lower lobe opacity, likely atelectasis.  No suspicious pulmonary nodules. Underlying moderate centrilobular and paraseptal emphysematous changes. No pneumothorax.  Visualized right thyroid is mildly nodular.  Coronary atherosclerosis. Atherosclerotic calcifications of the aortic arch.  Thoracic lymphadenopathy, including:  --10 mm short axis right paratracheal node (series 4/ image 28)  --12 mm short axis prevascular node (series 4/ image 35)  --14 mm short axis right paratracheal node (series 4/ image 38)  --16 mm short axis left hilar node (series 4/image 50)  --15 mm short axis subcarinal node (series 4/ image 51)  --11 mm short axis right perihilar node (series 4/ image 52)  Degenerative changes of the visualized thoracic spine.  CT ABDOMEN and PELVIS FINDINGS  Liver is notable for a 1.7 x 1.1 cm hypoenhancing lesion in the lateral segment left hepatic lobe (series 61/PJKDT 20), of uncertain etiology, but likely present on unenhanced CT from 2013.  Spleen is within the upper limits of normal for size.  Pancreas and right adrenal gland are within normal limits. Mild nodular thickening of the left adrenal gland, without discrete mass.  Gallbladder is mildly distended, without associated inflammatory changes. No intrahepatic or extrahepatic ductal dilatation.  Status post a right nephrectomy. 2.1 cm lateral interpolar left renal cyst (series 11/ image 38). Additional  small left renal cyst. No hydronephrosis.  No evidence of bowel obstruction. Colonic diverticulosis, particularly involving the sigmoid colon. Mild pericolonic stranding/fluid along the distal sigmoid colon (series 11/image 74), raising the possibility of mild sigmoid diverticulitis. No drainable fluid  collection/abscess. No free air.  Associated patent fistulous track between sigmoid colon and vaginal cuff in this patient status post hysterectomy (series 11/ images 75-80).  No adnexal masses.  Bladder is mildly thick-walled but underdistended.  Atherosclerotic calcifications of the abdominal aorta and branch vessels.  No abdominopelvic ascites.  No suspicious abdominopelvic lymphadenopathy.  Mild degenerative changes of the lumbar spine.  Review of the MIP images confirms the above findings.  IMPRESSION: No evidence of pulmonary embolism.  Cardiomegaly with mild interstitial edema and small bilateral pleural effusions, right greater than left.  Patchy right lower lobe opacity, atelectasis versus pneumonia. Lingular and left lower lobe opacity, likely atelectasis.  Thoracic lymphadenopathy, possibly reactive, although indeterminate.  Possible mild sigmoid diverticulitis. No drainable fluid collection/abscess. No free air.  Associated patent fistulous track between the sigmoid colon and vaginal cuff.   Electronically Signed   By: Julian Hy M.D.   On: 06/06/2014 21:02   Ct Abdomen Pelvis W Contrast  06/06/2014   CLINICAL DATA:  Shortness of breath, evaluate for pneumonia or kidney.  EXAM: CT ANGIOGRAPHY CHEST  CT ABDOMEN AND PELVIS WITH CONTRAST  TECHNIQUE: Multidetector CT imaging of the chest was performed using the standard protocol during bolus administration of intravenous contrast. Multiplanar CT image reconstructions and MIPs were obtained to evaluate the vascular anatomy. Multidetector CT imaging of the abdomen and pelvis was performed using the standard protocol during bolus administration of intravenous contrast.  CONTRAST:  133mL OMNIPAQUE IOHEXOL 350 MG/ML SOLN  COMPARISON:  CT abdomen pelvis dated 05/28/2014.  FINDINGS: CTA CHEST FINDINGS  No evidence of pulmonary embolism.  Interlobular septal thickening, upper lobe predominant, suspicious for mild interstitial edema. Associated cardiomegaly  with small bilateral pleural effusions, right greater than left.  Patchy right lower lobe opacity (series 4/ image 67), atelectasis versus pneumonia. Lingular and left lower lobe opacity, likely atelectasis.  No suspicious pulmonary nodules. Underlying moderate centrilobular and paraseptal emphysematous changes. No pneumothorax.  Visualized right thyroid is mildly nodular.  Coronary atherosclerosis. Atherosclerotic calcifications of the aortic arch.  Thoracic lymphadenopathy, including:  --10 mm short axis right paratracheal node (series 4/ image 28)  --12 mm short axis prevascular node (series 4/ image 35)  --14 mm short axis right paratracheal node (series 4/ image 38)  --16 mm short axis left hilar node (series 4/image 50)  --15 mm short axis subcarinal node (series 4/ image 51)  --11 mm short axis right perihilar node (series 4/ image 52)  Degenerative changes of the visualized thoracic spine.  CT ABDOMEN and PELVIS FINDINGS  Liver is notable for a 1.7 x 1.1 cm hypoenhancing lesion in the lateral segment left hepatic lobe (series 16/XWRUE 20), of uncertain etiology, but likely present on unenhanced CT from 2013.  Spleen is within the upper limits of normal for size.  Pancreas and right adrenal gland are within normal limits. Mild nodular thickening of the left adrenal gland, without discrete mass.  Gallbladder is mildly distended, without associated inflammatory changes. No intrahepatic or extrahepatic ductal dilatation.  Status post a right nephrectomy. 2.1 cm lateral interpolar left renal cyst (series 11/ image 38). Additional small left renal cyst. No hydronephrosis.  No evidence of bowel obstruction. Colonic diverticulosis, particularly involving the sigmoid colon. Mild pericolonic stranding/fluid along the distal  sigmoid colon (series 11/image 74), raising the possibility of mild sigmoid diverticulitis. No drainable fluid collection/abscess. No free air.  Associated patent fistulous track between sigmoid  colon and vaginal cuff in this patient status post hysterectomy (series 11/ images 75-80).  No adnexal masses.  Bladder is mildly thick-walled but underdistended.  Atherosclerotic calcifications of the abdominal aorta and branch vessels.  No abdominopelvic ascites.  No suspicious abdominopelvic lymphadenopathy.  Mild degenerative changes of the lumbar spine.  Review of the MIP images confirms the above findings.  IMPRESSION: No evidence of pulmonary embolism.  Cardiomegaly with mild interstitial edema and small bilateral pleural effusions, right greater than left.  Patchy right lower lobe opacity, atelectasis versus pneumonia. Lingular and left lower lobe opacity, likely atelectasis.  Thoracic lymphadenopathy, possibly reactive, although indeterminate.  Possible mild sigmoid diverticulitis. No drainable fluid collection/abscess. No free air.  Associated patent fistulous track between the sigmoid colon and vaginal cuff.   Electronically Signed   By: Julian Hy M.D.   On: 06/06/2014 21:02   Dg Chest Portable 1 View  06/06/2014   CLINICAL DATA:  Abdominal pain and shortness of breath  EXAM: PORTABLE CHEST - 1 VIEW  COMPARISON:  05/27/2014  FINDINGS: Cardiac shadow is stable in appearance. Increasing infiltrate is noted in the medial right lung base as well as in the lateral left lung base. Diffuse interstitial changes remain with mild interstitial edema.  IMPRESSION: Increasing bibasilar infiltrates.   Electronically Signed   By: Inez Catalina M.D.   On: 06/06/2014 16:03    Anti-infectives: Anti-infectives   Start     Dose/Rate Route Frequency Ordered Stop   06/08/14 1200  fluconazole (DIFLUCAN) tablet 100 mg  Status:  Discontinued     100 mg Oral Every 48 hours 06/06/14 2117 06/07/14 0729   06/07/14 1400  cefTRIAXone (ROCEPHIN) 1 g in dextrose 5 % 50 mL IVPB     1 g 100 mL/hr over 30 Minutes Intravenous Every 24 hours 06/07/14 1208     06/07/14 1200  metroNIDAZOLE (FLAGYL) IVPB 500 mg     500  mg 100 mL/hr over 60 Minutes Intravenous Every 6 hours 06/07/14 1143     06/07/14 1000  fluconazole (DIFLUCAN) tablet 50 mg  Status:  Discontinued     50 mg Oral Every 48 hours 06/06/14 2121 06/07/14 0729   06/07/14 0000  vancomycin (VANCOCIN) 50 mg/mL oral solution 125 mg     125 mg Oral 4 times per day 06/06/14 2122     06/06/14 2200  vancomycin (VANCOCIN) 125 MG capsule 125 mg  Status:  Discontinued     125 mg Oral 4 times daily 06/06/14 2117 06/06/14 2121   06/06/14 1645  vancomycin (VANCOCIN) 1,250 mg in sodium chloride 0.9 % 250 mL IVPB  Status:  Discontinued     1,250 mg 166.7 mL/hr over 90 Minutes Intravenous  Once 06/06/14 1641 06/06/14 1735   06/06/14 1645  piperacillin-tazobactam (ZOSYN) IVPB 2.25 g  Status:  Discontinued     2.25 g 100 mL/hr over 30 Minutes Intravenous 3 times per day 06/06/14 1641 06/06/14 1719      Assessment/Plan:  Colovaginal fistula  Will let her have clear liquids.  If drainage increases, will have to back off.  Probable resection and colostomy next week when stable medically. ?starting TNA to maximize nutrition  LOS: 2 days    Carsynn Bethune A 06/08/2014

## 2014-06-09 DIAGNOSIS — D631 Anemia in chronic kidney disease: Secondary | ICD-10-CM

## 2014-06-09 DIAGNOSIS — N189 Chronic kidney disease, unspecified: Secondary | ICD-10-CM

## 2014-06-09 DIAGNOSIS — J9610 Chronic respiratory failure, unspecified whether with hypoxia or hypercapnia: Secondary | ICD-10-CM

## 2014-06-09 DIAGNOSIS — R0902 Hypoxemia: Secondary | ICD-10-CM

## 2014-06-09 DIAGNOSIS — N039 Chronic nephritic syndrome with unspecified morphologic changes: Secondary | ICD-10-CM

## 2014-06-09 LAB — DIFFERENTIAL
Basophils Absolute: 0 10*3/uL (ref 0.0–0.1)
Basophils Relative: 0 % (ref 0–1)
EOS ABS: 0.5 10*3/uL (ref 0.0–0.7)
EOS PCT: 6 % — AB (ref 0–5)
LYMPHS ABS: 1.5 10*3/uL (ref 0.7–4.0)
Lymphocytes Relative: 15 % (ref 12–46)
MONOS PCT: 9 % (ref 3–12)
Monocytes Absolute: 0.8 10*3/uL (ref 0.1–1.0)
Neutro Abs: 6.8 10*3/uL (ref 1.7–7.7)
Neutrophils Relative %: 70 % (ref 43–77)

## 2014-06-09 LAB — COMPREHENSIVE METABOLIC PANEL
ALK PHOS: 85 U/L (ref 39–117)
ALT: 8 U/L (ref 0–35)
ANION GAP: 13 (ref 5–15)
AST: 14 U/L (ref 0–37)
Albumin: 2.2 g/dL — ABNORMAL LOW (ref 3.5–5.2)
BUN: 26 mg/dL — ABNORMAL HIGH (ref 6–23)
CALCIUM: 8.1 mg/dL — AB (ref 8.4–10.5)
CO2: 24 meq/L (ref 19–32)
Chloride: 96 mEq/L (ref 96–112)
Creatinine, Ser: 4.88 mg/dL — ABNORMAL HIGH (ref 0.50–1.10)
GFR calc Af Amer: 9 mL/min — ABNORMAL LOW (ref >90)
GFR, EST NON AFRICAN AMERICAN: 8 mL/min — AB (ref >90)
GLUCOSE: 134 mg/dL — AB (ref 70–99)
Potassium: 5 mEq/L (ref 3.7–5.3)
SODIUM: 133 meq/L — AB (ref 137–147)
Total Bilirubin: <0.2 mg/dL — ABNORMAL LOW (ref 0.3–1.2)
Total Protein: 5.4 g/dL — ABNORMAL LOW (ref 6.0–8.3)

## 2014-06-09 LAB — GLUCOSE, CAPILLARY
GLUCOSE-CAPILLARY: 129 mg/dL — AB (ref 70–99)
Glucose-Capillary: 122 mg/dL — ABNORMAL HIGH (ref 70–99)
Glucose-Capillary: 112 mg/dL — ABNORMAL HIGH (ref 70–99)
Glucose-Capillary: 104 mg/dL — ABNORMAL HIGH (ref 70–99)
Glucose-Capillary: 112 mg/dL — ABNORMAL HIGH (ref 70–99)

## 2014-06-09 LAB — CBC
HCT: 37.4 % (ref 36.0–46.0)
HEMOGLOBIN: 11 g/dL — AB (ref 12.0–15.0)
MCH: 28.2 pg (ref 26.0–34.0)
MCHC: 29.4 g/dL — AB (ref 30.0–36.0)
MCV: 95.9 fL (ref 78.0–100.0)
Platelets: 191 10*3/uL (ref 150–400)
RBC: 3.9 MIL/uL (ref 3.87–5.11)
RDW: 18.5 % — ABNORMAL HIGH (ref 11.5–15.5)
WBC: 9.6 10*3/uL (ref 4.0–10.5)

## 2014-06-09 LAB — PREALBUMIN: Prealbumin: 8.1 mg/dL — ABNORMAL LOW (ref 17.0–34.0)

## 2014-06-09 LAB — PHOSPHORUS: PHOSPHORUS: 5.2 mg/dL — AB (ref 2.3–4.6)

## 2014-06-09 LAB — TRIGLYCERIDES: Triglycerides: 88 mg/dL (ref <150)

## 2014-06-09 LAB — MAGNESIUM: Magnesium: 1.8 mg/dL (ref 1.5–2.5)

## 2014-06-09 MED ORDER — LIDOCAINE-PRILOCAINE 2.5-2.5 % EX CREA: 1.0000 "application " | TOPICAL_CREAM | CUTANEOUS | Status: DC | PRN

## 2014-06-09 MED ORDER — HEPARIN SODIUM (PORCINE) 1000 UNIT/ML DIALYSIS
1000.0000 [IU] | INTRAMUSCULAR | Status: DC | PRN
  Filled 2014-06-09: qty 1

## 2014-06-09 MED ORDER — HEPARIN SODIUM (PORCINE) 1000 UNIT/ML DIALYSIS: 1000.0000 [IU] | INTRAMUSCULAR | Status: DC | PRN

## 2014-06-09 MED ORDER — PENTAFLUOROPROP-TETRAFLUOROETH EX AERO: 1.0000 "application " | INHALATION_SPRAY | CUTANEOUS | Status: DC | PRN

## 2014-06-09 MED ORDER — ALTEPLASE 2 MG IJ SOLR: 2.0000 mg | Freq: Once | INTRAMUSCULAR | Status: AC | PRN

## 2014-06-09 MED ORDER — METOPROLOL TARTRATE 1 MG/ML IV SOLN: 5.0000 mg | Freq: Four times a day (QID) | INTRAVENOUS | Status: DC | PRN

## 2014-06-09 MED ORDER — SODIUM CHLORIDE 0.9 % IV SOLN: 100.0000 mL | INTRAVENOUS | Status: DC | PRN

## 2014-06-09 MED ORDER — NEPRO/CARBSTEADY PO LIQD: 237.0000 mL | ORAL | Status: DC | PRN

## 2014-06-09 MED ORDER — SODIUM CHLORIDE 0.9 % IV SOLN
INTRAVENOUS | Status: DC
  Administered 2014-06-09: 09:00:00 via INTRAVENOUS

## 2014-06-09 MED ORDER — TRACE MINERALS CR-CU-F-FE-I-MN-MO-SE-ZN IV SOLN
INTRAVENOUS | Status: AC
  Administered 2014-06-09: 17:00:00 via INTRAVENOUS
  Filled 2014-06-09: qty 2000

## 2014-06-09 MED ORDER — FAT EMULSION 20 % IV EMUL
250.0000 mL | INTRAVENOUS | Status: AC
  Administered 2014-06-09: 250 mL via INTRAVENOUS
  Filled 2014-06-09: qty 250

## 2014-06-09 MED ORDER — ALTEPLASE 2 MG IJ SOLR: 2.0000 mg | Freq: Once | INTRAMUSCULAR | Status: DC | PRN

## 2014-06-09 MED ORDER — LIDOCAINE HCL (PF) 1 % IJ SOLN
5.0000 mL | INTRAMUSCULAR | Status: DC | PRN
  Filled 2014-06-09: qty 5

## 2014-06-09 MED ORDER — LIDOCAINE HCL (PF) 1 % IJ SOLN: 5.0000 mL | INTRAMUSCULAR | Status: DC | PRN

## 2014-06-09 NOTE — Progress Notes (Signed)
Vitals stable before transfer from [place] to 6N bed 9. Patient transferred with RN on 4L [place].

## 2014-06-09 NOTE — Procedures (Signed)
I was present at this dialysis session, have reviewed the session itself and made  appropriate changes  Kelly Splinter MD (pgr) [phone]    ([phone] 06/09/2014, 11:18 AM

## 2014-06-09 NOTE — Progress Notes (Signed)
Patient ID: [name], female   DOB: [birth year], 73 y.o.   MRN: [id number]     [facility]      [address] [place], [place] [address]    Phone: [phone] FAX: [phone]     Subjective: BM yesterday.  Denies increased vaginal drainage.  Afebrile.  Normal white count.    Objective:  Vital signs:  Filed Vitals:   06/09/14 0826 06/09/14 0900 06/09/14 0930 06/09/14 1000  BP: 99/69 156/60 172/65 133/59  Pulse: 66 66 65 69  Temp:      TempSrc:      Resp: _0 Height:      Weight:      SpO2: 97% 97% 96% 97%    Last BM Date: 06/08/14  Intake/Output   Yesterday:  09/10 0701 - 09/11 0700 In: 1680 [P.O.:480; I.V.:217.5; IV Piggyback:300; TPN:682.5] Out: -  This shift: I/O last 3 completed shifts: In: 2240 [P.O.:480; I.V.:577.5; IV Piggyback:500] Out: -     Physical Exam: General: Pt awake/alert/oriented x4 in no acute distress Abdomen: Soft.  Nondistended.  Non tender.  No evidence of peritonitis.  No incarcerated hernias.    Problem List:   Active Problems:   CKD (chronic kidney disease) stage V requiring chronic dialysis   DM2 (diabetes mellitus, type 2)   C. difficile colitis   Chronic respiratory failure with hypoxia   HTN (hypertension)   Pulmonary HTN   OSA (obstructive sleep apnea)   COPD (chronic obstructive pulmonary disease)   Acute respiratory failure with hypoxia   HCAP (healthcare-associated pneumonia)   Abdominal pain, acute   Colovesical fistula    Results:   Labs: Results for orders placed during the hospital encounter of 06/06/14 (from the past 23 hour(s))  GLUCOSE, CAPILLARY     Status: Abnormal   Collection Time    06/07/14 12:39 PM      Result Value Ref Range   Glucose-Capillary 102 (*) 70 - 99 mg/dL  GLUCOSE, CAPILLARY     Status: None   Collection Time    06/07/14  9:06 PM      Result Value Ref Range   Glucose-Capillary 87  70 - 99 mg/dL  CBC     Status: Abnormal    Collection Time    06/08/14  5:00 AM      Result Value Ref Range   WBC 10.0  4.0 - 10.5 K/uL   RBC 3.74 (*) 3.87 - 5.11 MIL/uL   Hemoglobin 10.7 (*) 12.0 - 15.0 g/dL   Comment: POST TRANSFUSION SPECIMEN     REPEATED TO VERIFY   HCT 35.6 (*) 36.0 - 46.0 %   MCV 95.2  78.0 - 100.0 fL   MCH 28.6  26.0 - 34.0 pg   MCHC 30.1  30.0 - 36.0 g/dL   RDW 19.3 (*) 11.5 - 15.5 %   Platelets 168  150 - 400 K/uL   Comment: REPEATED TO VERIFY  COMPREHENSIVE METABOLIC PANEL     Status: Abnormal   Collection Time    06/08/14  5:00 AM      Result Value Ref Range   Sodium 137  137 - 147 mEq/L   Potassium 4.6  3.7 - 5.3 mEq/L   Chloride 97  96 - 112 mEq/L   CO2 27  19 - 32 mEq/L   Glucose, Bld 88  70 - 99 mg/dL   BUN 16  6 - 23 mg/dL  Comment: DELTA CHECK NOTED   Creatinine, Ser 3.72 (*) 0.50 - 1.10 mg/dL   Comment: DELTA CHECK NOTED   Calcium 8.2 (*) 8.4 - 10.5 mg/dL   Total Protein 5.4 (*) 6.0 - 8.3 g/dL   Albumin 2.1 (*) 3.5 - 5.2 g/dL   AST 14  0 - 37 U/L   ALT 9  0 - 35 U/L   Alkaline Phosphatase 87  39 - 117 U/L   Total Bilirubin 0.4  0.3 - 1.2 mg/dL   GFR calc non Af Amer 11 (*) >90 mL/min   GFR calc Af Amer 13 (*) >90 mL/min   Comment: (NOTE)     The eGFR has been calculated using the CKD EPI equation.     This calculation has not been validated in all clinical situations.     eGFR's persistently <90 mL/min signify possible Chronic Kidney     Disease.   Anion gap 13  5 - 15  PREALBUMIN     Status: Abnormal   Collection Time    06/08/14  5:00 AM      Result Value Ref Range   Prealbumin 8.3 (*) 17.0 - 34.0 mg/dL   Comment: Performed at [facility], CAPILLARY     Status: None   Collection Time    06/08/14  7:48 AM      Result Value Ref Range   Glucose-Capillary 87  70 - 99 mg/dL  APTT     Status: None   Collection Time    06/08/14 10:30 AM      Result Value Ref Range   aPTT 33  24 - 37 seconds  PROTIME-INR     Status: None   Collection Time     06/08/14 10:30 AM      Result Value Ref Range   Prothrombin Time 14.9  11.6 - 15.2 seconds   INR 1.17  0.00 - 1.49  GLUCOSE, CAPILLARY     Status: None   Collection Time    06/08/14 11:54 AM      Result Value Ref Range   Glucose-Capillary 95  70 - 99 mg/dL  GLUCOSE, CAPILLARY     Status: None   Collection Time    06/08/14  4:48 PM      Result Value Ref Range   Glucose-Capillary 93  70 - 99 mg/dL  GLUCOSE, CAPILLARY     Status: Abnormal   Collection Time    06/08/14  9:34 PM      Result Value Ref Range   Glucose-Capillary 122 (*) 70 - 99 mg/dL  COMPREHENSIVE METABOLIC PANEL     Status: Abnormal   Collection Time    06/09/14  3:55 AM      Result Value Ref Range   Sodium 133 (*) 137 - 147 mEq/L   Potassium 5.0  3.7 - 5.3 mEq/L   Chloride 96  96 - 112 mEq/L   CO2 24  19 - 32 mEq/L   Glucose, Bld 134 (*) 70 - 99 mg/dL   BUN 26 (*) 6 - 23 mg/dL   Creatinine, Ser 4.88 (*) 0.50 - 1.10 mg/dL   Calcium 8.1 (*) 8.4 - 10.5 mg/dL   Total Protein 5.4 (*) 6.0 - 8.3 g/dL   Albumin 2.2 (*) 3.5 - 5.2 g/dL   AST 14  0 - 37 U/L   ALT 8  0 - 35 U/L   Alkaline Phosphatase 85  39 - 117 U/L   Total Bilirubin <0.2 (*)  0.3 - 1.2 mg/dL   GFR calc non Af Amer 8 (*) >90 mL/min   GFR calc Af Amer 9 (*) >90 mL/min   Comment: (NOTE)     The eGFR has been calculated using the CKD EPI equation.     This calculation has not been validated in all clinical situations.     eGFR's persistently <90 mL/min signify possible Chronic Kidney     Disease.   Anion gap 13  5 - 15  MAGNESIUM     Status: None   Collection Time    06/09/14  3:55 AM      Result Value Ref Range   Magnesium 1.8  1.5 - 2.5 mg/dL  PHOSPHORUS     Status: Abnormal   Collection Time    06/09/14  3:55 AM      Result Value Ref Range   Phosphorus 5.2 (*) 2.3 - 4.6 mg/dL  TRIGLYCERIDES     Status: None   Collection Time    06/09/14  3:55 AM      Result Value Ref Range   Triglycerides 88  <150 mg/dL  CBC     Status: Abnormal    Collection Time    06/09/14  3:55 AM      Result Value Ref Range   WBC 9.6  4.0 - 10.5 K/uL   RBC 3.90  3.87 - 5.11 MIL/uL   Hemoglobin 11.0 (*) 12.0 - 15.0 g/dL   HCT 37.4  36.0 - 46.0 %   MCV 95.9  78.0 - 100.0 fL   MCH 28.2  26.0 - 34.0 pg   MCHC 29.4 (*) 30.0 - 36.0 g/dL   RDW 18.5 (*) 11.5 - 15.5 %   Platelets 191  150 - 400 K/uL  DIFFERENTIAL     Status: Abnormal   Collection Time    06/09/14  3:55 AM      Result Value Ref Range   Neutrophils Relative % 70  43 - 77 %   Neutro Abs 6.8  1.7 - 7.7 K/uL   Lymphocytes Relative 15  12 - 46 %   Lymphs Abs 1.5  0.7 - 4.0 K/uL   Monocytes Relative 9  3 - 12 %   Monocytes Absolute 0.8  0.1 - 1.0 K/uL   Eosinophils Relative 6 (*) 0 - 5 %   Eosinophils Absolute 0.5  0.0 - 0.7 K/uL   Basophils Relative 0  0 - 1 %   Basophils Absolute 0.0  0.0 - 0.1 K/uL  GLUCOSE, CAPILLARY     Status: Abnormal   Collection Time    06/09/14  7:44 AM      Result Value Ref Range   Glucose-Capillary 122 (*) 70 - 99 mg/dL    Imaging / Studies: Dg Chest Portable 1 View  06/08/2014   CLINICAL DATA:  Central line placement.  EXAM: PORTABLE CHEST - 1 VIEW  COMPARISON:  CT chest 06/06/2014.  Chest x-ray 06/06/2014.  FINDINGS: Right IJ line noted with tip projected over superior vena cava. Mediastinum and hilar structures normal. Cardiomegaly with pulmonary vascular prominence and diffuse interstitial prominence is with congestive heart for pleural interstitial edema. Small left pleural effusion. No pneumothorax. Surgical clips noted in the neck.  IMPRESSION: 1. Right IJ catheter noted with tip projected over superior vena cava. 2. Congestive heart failure with pulmonary edema and small left pleural effusion.   Electronically Signed   By: Marcello Moores  Register   On: 06/08/2014 15:08    Medications /  Allergies:  Scheduled Meds: . sodium chloride   Intravenous Once  . albuterol  2.5 mg Nebulization Once  . amiodarone  200 mg Oral Daily  . antiseptic oral rinse  7  mL Mouth Rinse BID  . calcitRIOL  0.25 mcg Oral Q M,W,F  . cefTRIAXone (ROCEPHIN)  IV  1 g Intravenous Q24H  . darbepoetin (ARANESP) injection - DIALYSIS  120 mcg Intravenous Q Wed-HD  . heparin subcutaneous  5,000 Units Subcutaneous 3 times per day  . insulin aspart  0-15 Units Subcutaneous TID WC  . levalbuterol  0.63 mg Nebulization TID  . metoprolol  5 mg Intravenous Q4H  . metronidazole  500 mg Intravenous Q6H  . ondansetron (ZOFRAN) IV  4 mg Intravenous 4 times per day  . rOPINIRole  1 mg Oral BID  . sodium chloride  3 mL Intravenous Q12H  . vancomycin  125 mg Oral 4 times per day   Continuous Infusions: . sodium chloride    . TPN (CLINIMIX) Adult without lytes 40 mL/hr at 06/08/14 1721   And  . fat emulsion 250 mL (06/08/14 1721)  . TPN (CLINIMIX) Adult without lytes     And  . fat emulsion     PRN Meds:.acetaminophen, ipratropium, morphine injection, ondansetron (ZOFRAN) IV, ondansetron  Antibiotics: Anti-infectives   Start     Dose/Rate Route Frequency Ordered Stop   06/08/14 1200  fluconazole (DIFLUCAN) tablet 100 mg  Status:  Discontinued     100 mg Oral Every 48 hours 06/06/14 2117 06/07/14 0729   06/07/14 1400  cefTRIAXone (ROCEPHIN) 1 g in dextrose 5 % 50 mL IVPB     1 g 100 mL/hr over 30 Minutes Intravenous Every 24 hours 06/07/14 1208     06/07/14 1200  metroNIDAZOLE (FLAGYL) IVPB 500 mg     500 mg 100 mL/hr over 60 Minutes Intravenous Every 6 hours 06/07/14 1143     06/07/14 1000  fluconazole (DIFLUCAN) tablet 50 mg  Status:  Discontinued     50 mg Oral Every 48 hours 06/06/14 2121 06/07/14 0729   06/07/14 0000  vancomycin (VANCOCIN) 50 mg/mL oral solution 125 mg     125 mg Oral 4 times per day 06/06/14 2122     06/06/14 2200  vancomycin (VANCOCIN) 125 MG capsule 125 mg  Status:  Discontinued     125 mg Oral 4 times daily 06/06/14 2117 06/06/14 2121   06/06/14 1645  vancomycin (VANCOCIN) 1,250 mg in sodium chloride 0.9 % 250 mL IVPB  Status:  Discontinued      1,250 mg 166.7 mL/hr over 90 Minutes Intravenous  Once 06/06/14 1641 06/06/14 1735   06/06/14 1645  piperacillin-tazobactam (ZOSYN) IVPB 2.25 g  Status:  Discontinued     2.25 g 100 mL/hr over 30 Minutes Intravenous 3 times per day 06/06/14 1641 06/06/14 1719      Assessment/Plan Anemia  ESRD/HD  CAD/Atrial fibrillation  COPD on home 02  HCAP-on Vanc  DM  Hx C diff colitis--negative 9/8   Mild sigmoid diverticulitis  Colovaginal fistula  -no increase in drainage with initiating clears.  Will discuss with Dr. Ninfa Linden if okay to advance to [facility] diet and decision for surgery -continue TPN -continue atbx -further management per primary team -will follow   Erby Pian, [facility] Pager [phone](7A-4:30P) For consults and floor pages call [phone](7A-4:30P)  06/09/2014 10:25 AM

## 2014-06-09 NOTE — Progress Notes (Signed)
INITIAL NUTRITION ASSESSMENT  DOCUMENTATION CODES Per approved criteria  -Not Applicable   INTERVENTION:  TPN per pharmacy  Recommend increasing TPN goal rate to Clinimix 5/15 at 70 ml/hr + 20% lipid emulsion at 10 ml/hr to provide 1672 kcal and 84g of protein daily. This rate provides 84% of estimated kcal needs and 99% of protein needs.  Recommend clear liquids limited to sips to prevent fluid overload and for pt to receive 100% of protein needs from TPN.  Will continue to monitor  NUTRITION DIAGNOSIS: Inadequate oral intake related to abdominal pain, fistula as evidenced by need for TPN.   Goal: Pt to meet >/= 90% of their estimated nutrition needs   Monitor:  TPN prescription, fluid balance, weight, labs, I/O's  Reason for Assessment: Consult for TPN  Admitting Dx: colovesical fistula   ASSESSMENT: 73 year old female patient with past history of afib not on chronic anticoagulation, COPD, diabetes, seizure disorder, chronic kidney disease on dialysis. She returns to the ER on 9/8 after noticing urine next is stool coming both from her vagina and rectum which was a new finding. She also reported increasing and worsening abdominal cramping with diarrhea.   Pt with history of ESRD on HD. Pt currently on clear liquids (Per MD: plan is to stay on clears for right now).  Pt is receiving TPN to optimize nutrition before surgery (resection). Receiving TPN:  Clinimix 5/15 at 40 ml/hr + 20% lipid emulsion at 10 ml/hr provides 48g protein and 1161 kCal per day. At current rate TPN provides 58% of estimated kcal needs and 60% of estimated protein needs.   Recommend increasing TPN goal rate to Clinimix 5/15 at 70 ml/hr + 20% lipid emulsion at 10 ml/hr to provide  1672 kcal and 84g of protein daily. This rate provides 84% of estimated kcal needs and 99% of protein needs.  Recommend clear liquids limited to sips to prevent fluid overload and for pt to receive 100% of protein needs from  TPN.  Unable to perform Nutrition Focused Physical exam at this time, pt is at dialysis.  Labs reviewed: Low Na High Phos  Height: Ht Readings from Last 1 Encounters:  06/06/14 5\' 4"  (1.626 m)    Weight: Wt Readings from Last 1 Encounters:  06/09/14 169 lb 12.1 oz (77 kg)  Dry weight 158 lb  (72 kg) Adjusted Wt  155 lb  (70.5kg)  Ideal Body Weight: 120 lb  % Ideal Body Weight: 141%  Wt Readings from Last 10 Encounters:  06/09/14 169 lb 12.1 oz (77 kg)  05/27/14 152 lb (68.947 kg)  05/26/14 155 lb 13.8 oz (70.7 kg)  05/09/14 164 lb (74.39 kg)  05/04/14 163 lb 9.3 oz (74.2 kg)  04/19/14 165 lb 9.1 oz (75.101 kg)  02/28/14 170 lb 13.7 oz (77.5 kg)  11/10/13 177 lb (80.287 kg)  10/11/13 178 lb (80.74 kg)  09/27/13 171 lb (77.565 kg)    Usual Body Weight: variable  % Usual Body Weight: NA  BMI:  Body mass index is 29.12 kg/(m^2).  Estimated Nutritional Needs: Kcal: 2000-2100 Protein: 85-95g Fluid: Per MD  Skin: ecchymosis  Diet Order: Clear Liquid  EDUCATION NEEDS: -No education needs identified at this time   Intake/Output Summary (Last 24 hours) at 06/09/14 0933 Last data filed at 06/09/14 0700  Gross per 24 hour  Intake   1470 ml  Output      0 ml  Net   1470 ml    Last BM: 9/10  Labs:  Recent Labs Lab 06/07/14 0013 06/08/14 0500 06/09/14 0355  NA 135* 137 133*  K 4.6 4.6 5.0  CL 93* 97 96  CO2 29 27 24   BUN 32* 16 26*  CREATININE 5.59* 3.72* 4.88*  CALCIUM 8.1* 8.2* 8.1*  MG  --   --  1.8  PHOS  --   --  5.2*  GLUCOSE 133* 88 134*    CBG (last 3)   Recent Labs  06/08/14 1648 06/08/14 2134 06/09/14 0744  GLUCAP 93 122* 122*    Scheduled Meds: . sodium chloride   Intravenous Once  . albuterol  2.5 mg Nebulization Once  . amiodarone  200 mg Oral Daily  . antiseptic oral rinse  7 mL Mouth Rinse BID  . calcitRIOL  0.25 mcg Oral Q M,W,F  . cefTRIAXone (ROCEPHIN)  IV  1 g Intravenous Q24H  . darbepoetin (ARANESP) injection  - DIALYSIS  120 mcg Intravenous Q Wed-HD  . heparin subcutaneous  5,000 Units Subcutaneous 3 times per day  . insulin aspart  0-15 Units Subcutaneous TID WC  . levalbuterol  0.63 mg Nebulization TID  . metoprolol  5 mg Intravenous Q4H  . metronidazole  500 mg Intravenous Q6H  . ondansetron (ZOFRAN) IV  4 mg Intravenous 4 times per day  . rOPINIRole  1 mg Oral BID  . sodium chloride  3 mL Intravenous Q12H  . vancomycin  125 mg Oral 4 times per day    Continuous Infusions: . sodium chloride    . TPN (CLINIMIX) Adult without lytes 40 mL/hr at 06/08/14 1721   And  . fat emulsion 250 mL (06/08/14 1721)  . TPN (CLINIMIX) Adult without lytes     And  . fat emulsion      Past Medical History  Diagnosis Date  . Carotid artery occlusion     bilat CEA in 2012  . C. 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 Hypertension   . Atrial fibrillation April  2013  . Pulmonary hypertension   . Secondary hyperparathyroidism, renal   . COPD (chronic obstructive pulmonary disease)   . Hypercholesterolemia   . History of MI (myocardial infarction)     "they say I had 2 years ago" (08/20/2012)  . GERD (gastroesophageal reflux disease)   . History of pneumonia     "have it alot" (08/20/2012)  . Sleep apnea     "don't wear mask anymore"  . Type II diabetes mellitus   . History of blood transfusion 2013    "3 times so far in 2013:  4 pints one time; 2 pints another; ?# last time" (08/20/2012)  . Iron deficiency anemia   . Seizures 2012    after carotid surgery  . ESRD on dialysis     "[place]; Tues, Thurs; Sat"  . On home oxygen therapy     "24/7"   . Acute diverticulitis     Oct 2013, treated medically [facility], sigmoid diverticulitits  . CHF (congestive heart failure)   . Coronary artery disease   . Seizure disorder March 2011  . Myocardial infarction   . Renal cell carcinoma 2005?  Marland Kitchen Cancer of kidney 2005  . Renal insufficiency     Past Surgical History   Procedure Laterality Date  . Nephrectomy  2005    Right, for Renal cell carcinoma  . Carotid endarterectomy  2012    bilaterally; Dr. Kellie Simmering  . Av fistula placement  Jan. 7, 2011  Right  upper arm by Dr. Kellie Simmering  . [facility]d [facility]  [facility]  . Appendectomy      childhood  . Abdominal hysterectomy  1971?  Marland Kitchen Coronary angioplasty with stent placement  1990's    "1 total"  . Coronary angioplasty  1990's  . Av fistula repair  2013    right upper arm  . Esophagogastroduodenoscopy N/A 12/29/2012    Procedure: ESOPHAGOGASTRODUODENOSCOPY (EGD);  Surgeon: Jeryl Columbia, MD;  Location[facility];  Service: Endoscopy;  Laterality: N/A;  . Colonoscopy with propofol N/A 12/31/2012    Procedure: COLONOSCOPY WITH PROPOFOL;  Surgeon: Jeryl Columbia, MD;  Location: [facility] ENDOSCOPY;  Service: Endoscopy;  Laterality: N/A;  currently IP at [place], [place], [place] Licensed Dietitian Nutritionist Pager: [phone]

## 2014-06-09 NOTE — Progress Notes (Signed)
I have seen and examined the patient and agree with the assessment and plans. Will hold on full liquids for now  Hanna. Ninfa Linden  MD, FACS

## 2014-06-09 NOTE — Progress Notes (Signed)
Patient left for hemodialysis via bed. [address]. Patient alert and oriented. VSS.

## 2014-06-09 NOTE — Progress Notes (Signed)
[name]  [name] XVQ:[id number] DOB: 12-04-1940 DOA: 06/06/2014 PCP: Gilford Rile, MD  Brief narrative: 73 year old female patient with past history of afib not on chronic anticoagulation, COPD, diabetes, seizure disorder, chronic kidney disease on dialysis. She presented to the emergency department because of ongoing abdominal pain and increasing shortness of breath. Patient had recently been hospitalized and subsequently discharged on 8/6 for acute diverticulitis noted on CT scan. She was discharged on Cipro and Flagyl. She was readmitted on 8/24 were shortness of breath with subsequent diagnosed with C. difficile colitis and pneumonia. She was discharged on 8/29 on cefuroxime and oral vancomycin. She returned to the the ER on the same date. She was observed in the ER for 6 hours and was stable and had no indications for readmission so she was discharged from the ER.  She returns to the ER on 9/8 after noticing urine next is stool coming both from her vagina and rectum which was a new finding. She also reported increasing and worsening abdominal cramping with diarrhea. Also noticed increased shortness of breath for the past 24 hours. She reported an episode of diffuse anterior chest pain duration 30 minutes 24 hours prior to presentation but this has not recurred. She is on chronic oxygen at home at 3 L per minute. Upon their arrival her saturations were initially 67% on 3 L of oxygen. She was placed on 100% nonrebreather mask and saturations improved to 100%. Upon the admitting physician's exam the patient was stable on 4 L nasal cannula oxygen. In the ER her white count was 13,300 with a normal platelet count . She was afebrile.  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 Gen. surgery has been consulted. They anticipate likely surgical intervention next  week. Nephrology is also following to manage her dialysis and while on TNA plans extra short courses of dialysis.  HPI/Subjective: Denies abdominal pain. Wants to know one she can finally eats solids. 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 difficile colitis-appreciate Gen. surgery assistance. On clears, TPN, abx per surgery.- Surgery suspects will need resection with temporary colostomy next week. On rocephin and flagyl per sugery    Recent C. difficile colitis -Continue oral vancomycin, IV flagyl-most recent C. difficile PCR on 9/8 was negative    Acute respiratory failure with hypoxia/doubt HCAP. CT findings likely atx, v. fluid Avoid adding abx given recent CDI   Anemia with symptoms -post 2 units pRBCs 9/8- pt endorses prior SOB and fatigue/malaise resolved-baseline Hgb~10 with pre tx hgb 7.9- post tx up to 10.7     CKD (chronic kidney disease) stage V requiring chronic dialysis -Per nephrology-dialysis M/W/F    DM2 (diabetes mellitus, type 2) -CBGs well controlled on SSI-watch on TNA-may need insulin added to TNA  HTN Not on meds as outpt. Change metoprolol to prn   Paroxysmal atrial fibrillation -Continue by mouth amiodarone-cont beta blocker IV for hypertensive control as well    Chronic respiratory failure with hypoxia due to:   A) Pulmonary HTN   B) OSA (obstructive sleep apnea)   C) COPD (chronic obstructive pulmonary disease) -All compensated at this point-on 3 L nasal cannula at home   DVT prophylaxis: Lovenox Code Status: Full Family Communication: No family at bedside Disposition  Plan/Expected LOS: Transfer to floor  Consultants: Nephrology General surgery PCCM for line placement  Procedures: CL 9/10  Cultures: 9/8 C. difficile negative - was positive 05/22/2014  Antibiotics: Oral vancomycin 8/23  >>> Flagyl rocephin  Objective: Blood pressure 115/57, pulse 70, temperature 98.2 F (36.8 C), temperature source Oral, resp. rate 22, height 5\' 4"  (1.626 m), weight 160 lb 0.9 oz (72.6 kg), SpO2 97.00%.  Intake/Output Summary (Last 24 hours) at 06/09/14 1508 Last data filed at 06/09/14 1230  Gross per 24 hour  Intake   1090 ml  Output   4196 ml  Net  -3106 ml   Exam: Gen: No acute respiratory distress Chest: CTA without WRR, 4 L Cardiac: Regular rate and rhythm, S1-S2, no rubs murmurs or gallops, no peripheral edema, no JVD Abdomen: Soft nontender over left abdomen nondistended without obvious hepatosplenomegaly, no ascites Extremities: Symmetrical in appearance without cyanosis, clubbing or effusion  Scheduled Meds:  Scheduled Meds: . sodium chloride   Intravenous Once  . albuterol  2.5 mg Nebulization Once  . amiodarone  200 mg Oral Daily  . antiseptic oral rinse  7 mL Mouth Rinse BID  . calcitRIOL  0.25 mcg Oral Q M,W,F  . cefTRIAXone (ROCEPHIN)  IV  1 g Intravenous Q24H  . darbepoetin (ARANESP) injection - DIALYSIS  120 mcg Intravenous Q Wed-HD  . heparin subcutaneous  5,000 Units Subcutaneous 3 times per day  . insulin aspart  0-15 Units Subcutaneous TID WC  . levalbuterol  0.63 mg Nebulization TID  . metoprolol  5 mg Intravenous Q4H  . metronidazole  500 mg Intravenous Q6H  . ondansetron (ZOFRAN) IV  4 mg Intravenous 4 times per day  . rOPINIRole  1 mg Oral BID  . sodium chloride  3 mL Intravenous Q12H  . vancomycin  125 mg Oral 4 times per day    Data Reviewed: Basic Metabolic Panel:  Recent Labs Lab 06/06/14 1620 06/06/14 1710 06/07/14 0013 06/08/14 0500 06/09/14 0355  NA 132*  135*  --  135* 137 133*  K 4.7  5.0  --  4.6 4.6 5.0  CL 97  93*  --  93* 97 96  CO2 29  --  29 27 24   GLUCOSE 103*  101*  --  133* 88 134*  BUN 27*  28*  --  32* 16 26*  CREATININE 5.20*  5.07* 5.21* 5.59* 3.72* 4.88*  CALCIUM 8.3*  --  8.1* 8.2* 8.1*  MG  --   --    --   --  1.8  PHOS  --   --   --   --  5.2*   Liver Function Tests:  Recent Labs Lab 06/06/14 1620 06/07/14 0013 06/08/14 0500 06/09/14 0355  AST 15 13 14 14   ALT 11 9 9 8   ALKPHOS 95 84 87 85  BILITOT 0.3 0.2* 0.4 <0.2*  PROT 5.8* 5.3* 5.4* 5.4*  ALBUMIN 2.3* 2.1* 2.1* 2.2*   CBC:  Recent Labs Lab 06/06/14 1620 06/06/14 1710 06/07/14 0013 06/08/14 0500 06/09/14 0355  WBC 12.9* 13.3* 11.8* 10.0 9.6  NEUTROABS 10.7*  --   --   --  6.8  HGB 8.7*  10.2* 8.6* 7.9* 10.7* 11.0*  HCT 30.6*  30.0* 30.4* 27.6* 35.6* 37.4  MCV 98.1 98.7 99.3 95.2 95.9  PLT 210 212 235 168 191   Cardiac Enzymes:  Recent Labs Lab 06/06/14 1713 06/07/14 0013  TROPONINI <0.30 <0.30   CBG:  Recent Labs Lab 06/08/14 1154 06/08/14  1648 06/08/14 2134 06/09/14 0744 06/09/14 1336  GLUCAP 95 93 122* 122* 112*    Recent Results (from the past 240 hour(s))  MRSA PCR SCREENING     Status: None   Collection Time    06/06/14  9:16 PM      Result Value Ref Range Status   MRSA by PCR NEGATIVE  NEGATIVE Final   Comment:            The GeneXpert MRSA Assay (FDA     approved for NASAL specimens     only), is one component of a     comprehensive MRSA colonization     surveillance program. It is not     intended to diagnose MRSA     infection nor to guide or     monitor treatment for     MRSA infections.  CLOSTRIDIUM DIFFICILE BY PCR     Status: None   Collection Time    06/06/14 10:44 PM      Result Value Ref Range Status   C difficile by pcr NEGATIVE  NEGATIVE Final     Studies:  Recent x-ray studies have been reviewed in detail by the Attending Physician  Time spent :  6mins      Allison Ellis, ANP [facility] Office  [phone] Pager [phone]  If 7PM-7AM, please contact night-coverage [email or web address] Password [email or web address] 06/09/2014, 3:08 PM   LOS: 3 days   Attending note:  Chart reviewed. Patient examined. Note amended. Agree with above.  Doree Barthel, MD [facility]

## 2014-06-09 NOTE — Progress Notes (Signed)
Note/chart reviewed. Agree with note.   Keil Pickering RD, LDN, CNSC [phone] Pager [phone] After Hours Pager   

## 2014-06-09 NOTE — Progress Notes (Addendum)
PARENTERAL NUTRITION CONSULT NOTE - FOLLOW UP  Pharmacy Consult for TPN Indication: Colovesical fistula  Allergies  Allergen Reactions  . Lipitor [Atorvastatin] Other (See Comments)    Causes weakness and drowsiness  . Morphine And Related Nausea And Vomiting  . Nsaids Other (See Comments)    Unknown reported by previous hospital    Patient Measurements: Height: 5\' 4"  (162.6 cm) Weight: 169 lb 12.1 oz (77 kg) IBW/kg (Calculated) : 54.7 Adjusted Body Weight: 61kg   Vital Signs: Temp: 97.7 F (36.5 C) (09/11 0821) Temp src: Oral (09/11 0821) BP: 118/53 mmHg (09/11 0821) Pulse Rate: 64 (09/11 0821) Intake/Output from previous day: 09/10 0701 - 09/11 0700 In: 1680 [P.O.:480; I.V.:217.5; IV Piggyback:300; TPN:682.5] Out: -  Intake/Output from this shift:    Labs:  Recent Labs  06/07/14 0013 06/08/14 0500 06/08/14 1030 06/09/14 0355  WBC 11.8* 10.0  --  9.6  HGB 7.9* 10.7*  --  11.0*  HCT 27.6* 35.6*  --  37.4  PLT 235 168  --  191  APTT  --   --  33  --   INR  --   --  1.17  --      Recent Labs  06/07/14 0013 06/08/14 0500 06/09/14 0355  NA 135* 137 133*  K 4.6 4.6 5.0  CL 93* 97 96  CO2 29 27 24   GLUCOSE 133* 88 134*  BUN 32* 16 26*  CREATININE 5.59* 3.72* 4.88*  CALCIUM 8.1* 8.2* 8.1*  MG  --   --  1.8  PHOS  --   --  5.2*  PROT 5.3* 5.4* 5.4*  ALBUMIN 2.1* 2.1* 2.2*  AST 13 14 14   ALT 9 9 8   ALKPHOS 84 87 85  BILITOT 0.2* 0.4 <0.2*  PREALBUMIN  --  8.3*  --   TRIG  --   --  88   Estimated Creatinine Clearance: 10.5 ml/min (by C-G formula based on Cr of 4.88).    Recent Labs  06/08/14 1648 06/08/14 2134 06/09/14 0744  GLUCAP 93 122* 122*    Insulin Requirements in the past 24 hours:  No SSI required  Current Nutrition:  Clear liquids Clinimix 5/15 at 40 ml/hr + 20% lipid emulsion at 10 ml/hr provides 48g protein and 1181 kCal per day.  Assessment: [name] who presented to the ED with colovesical fistula which was noted on CT.  General surgery was consulted and they recommended bowel rest and antibiotics. Pt has h/o ESRD on dialysis   GI: Acute abdominal pain with colovesical fistula. Gen surg suspects patient will need resection with temporary colostomy next week.  She is taking in some clear liquids and TPN is being used to optimize nutrition prior to surgery. Endo: h/o DM, CBGs 93-122, on moderate scale SSI  Lytes: K 5, Na 133, Phos 5.2, Mag 1.8 - on electrolyte-free TPN Renal: ESRD on HD MWF per nephrology.   Pulm: COPD, 4 L [place]  Cards: HTN, Afib, HLD, BP 156/60, HR 66 Hepatobil: LFTs wnl  Neuro: h/o seizures;  ID: On D#3 Rocephin, flagyl and oral vancomycin, WBC wnl, pt is afebrile  Best Practices: Heparin [place]  TPN Access:  R IJ CVC 06/08/14 TPN day#: 2 (started 9/10)  Nutritional Goals:  ~1600 kCal, ~75 grams of protein per day  Plan:  Increase Clinimix 5/15 to 60 ml/hr and continue 20% lipid emulsion at 10 ml/hr.  This will provide 72g protein and 1522 kCal per day. Continue with electrolyte-free TPN.  Check CMET,  Magnesium, Phosphorus with AM labs. Will follow for RD recommendations for nutritional goals. Monitor for fluid balance, especially since she is HD-dependent.  This may limit how much TPN we are able to provide. Follow CBGs and insulin requirements.  Legrand Como, Pharm.D., BCPS, AAHIVP Clinical Pharmacist Phone: [phone] or [phone] 06/09/2014, 9:27 AM   Addendum: Nutrition Goals established by RD: Kcal: 2000-2100  Protein: 85-95g  We will likely have to target providing ~85% of her nutritional needs with TPN to limit the amount of fluid she is receiving.  Dr. Jonnie Finner is planning to provide extra HD sessions while on TPN to help with fluid management.    Plan: Will evaluate her tolerance of TPN at 60 ml/hr on 9/12 to determine if we will be able to further increase her rate.  Legrand Como, Pharm.D., BCPS, AAHIVP Clinical Pharmacist Phone: [phone] or [phone] 06/09/2014, 1:38  PM

## 2014-06-09 NOTE — Progress Notes (Signed)
[facility] Progress Note   Subjective: On TNA today  Filed Vitals:   06/09/14 0930 06/09/14 1000 06/09/14 1030 06/09/14 1100  BP: 172/65 133/59 118/82 111/52  Pulse: 65 69 77 68  Temp:      TempSrc:      Resp: 21 20 20 21   Height:      Weight:      SpO2: 96% 97% 97% 96%   Exam: Alert, no distress, sitting up in bed No jvd Chest dec'd BS throughout, mild rales bilat bases RRR no MRG Abd soft, NTND, +BS No LE edema RUA AVF +bruit, bruising  Neuro is nf, Ox 3  HD: MWF [place] 4h   72kg   2/2.25 [place]  400/A1.5  RUA AVF 15ga   Prof 2   Heparin none Calcitriol 0.25 ug po TIW, Aranesp 120 ug q week Pth 384, P 4.4, tsat 30, eKT/V 1.46  Assessment: 1 Abd pain / RV fistula - hx recent diverticulitis episode, on IV AB and TNA now 2 C dificile infection on po vanc 3 ESRD on HD 4 Vol excess / pulm edema - by cxr, not symptomatic 5 HPTH resume phoslo when eating, cont vit D 6 Anemia s/p prbc x 2, cont aranesp 120/wk 7 DM on oral meds 8 Afib on amio, IV MTP while npo 9 COPD on continuous O2 at home and here   Plan- max UF with HD today, will need some extra HD while on TNA, plan short HD tomorrow     Kelly Splinter MD  pager [phone]    cell [phone]  06/09/2014, 11:05 AM     Recent Labs Lab 06/07/14 0013 06/08/14 0500 06/09/14 0355  NA 135* 137 133*  K 4.6 4.6 5.0  CL 93* 97 96  CO2 29 27 24   GLUCOSE 133* 88 134*  BUN 32* 16 26*  CREATININE 5.59* 3.72* 4.88*  CALCIUM 8.1* 8.2* 8.1*  PHOS  --   --  5.2*    Recent Labs Lab 06/07/14 0013 06/08/14 0500 06/09/14 0355  AST 13 14 14   ALT 9 9 8   ALKPHOS 84 87 85  BILITOT 0.2* 0.4 <0.2*  PROT 5.3* 5.4* 5.4*  ALBUMIN 2.1* 2.1* 2.2*    Recent Labs Lab 06/06/14 1620  06/07/14 0013 06/08/14 0500 06/09/14 0355  WBC 12.9*  < > 11.8* 10.0 9.6  NEUTROABS 10.7*  --   --   --  6.8  HGB 8.7*  10.2*  < > 7.9* 10.7* 11.0*  HCT 30.6*  30.0*  < > 27.6* 35.6* 37.4  MCV 98.1  < > 99.3 95.2 95.9   PLT 210  < > 235 168 191  < > = values in this interval not displayed. . sodium chloride   Intravenous Once  . albuterol  2.5 mg Nebulization Once  . amiodarone  200 mg Oral Daily  . antiseptic oral rinse  7 mL Mouth Rinse BID  . calcitRIOL  0.25 mcg Oral Q M,W,F  . cefTRIAXone (ROCEPHIN)  IV  1 g Intravenous Q24H  . darbepoetin (ARANESP) injection - DIALYSIS  120 mcg Intravenous Q Wed-HD  . heparin subcutaneous  5,000 Units Subcutaneous 3 times per day  . insulin aspart  0-15 Units Subcutaneous TID WC  . levalbuterol  0.63 mg Nebulization TID  . metoprolol  5 mg Intravenous Q4H  . metronidazole  500 mg Intravenous Q6H  . ondansetron (ZOFRAN) IV  4 mg Intravenous 4 times per day  . rOPINIRole  1 mg  Oral BID  . sodium chloride  3 mL Intravenous Q12H  . vancomycin  125 mg Oral 4 times per day   . sodium chloride    . TPN (CLINIMIX) Adult without lytes 40 mL/hr at 06/08/14 1721   And  . fat emulsion 250 mL (06/08/14 1721)  . TPN (CLINIMIX) Adult without lytes     And  . fat emulsion     acetaminophen, ipratropium, morphine injection, ondansetron (ZOFRAN) IV, ondansetron

## 2014-06-10 ENCOUNTER — Inpatient Hospital Stay (HOSPITAL_COMMUNITY): Payer: Medicare Other

## 2014-06-10 LAB — CBC
HCT: 35.9 % — ABNORMAL LOW (ref 36.0–46.0)
HEMOGLOBIN: 10.6 g/dL — AB (ref 12.0–15.0)
MCH: 28.4 pg (ref 26.0–34.0)
MCHC: 29.5 g/dL — ABNORMAL LOW (ref 30.0–36.0)
MCV: 96.2 fL (ref 78.0–100.0)
Platelets: 147 10*3/uL — ABNORMAL LOW (ref 150–400)
RBC: 3.73 MIL/uL — ABNORMAL LOW (ref 3.87–5.11)
RDW: 17.8 % — ABNORMAL HIGH (ref 11.5–15.5)
WBC: 8.3 10*3/uL (ref 4.0–10.5)

## 2014-06-10 LAB — COMPREHENSIVE METABOLIC PANEL
ALBUMIN: 2.2 g/dL — AB (ref 3.5–5.2)
ALK PHOS: 80 U/L (ref 39–117)
ALT: 6 U/L (ref 0–35)
AST: 10 U/L (ref 0–37)
Anion gap: 9 (ref 5–15)
BUN: 22 mg/dL (ref 6–23)
CHLORIDE: 97 meq/L (ref 96–112)
CO2: 27 mEq/L (ref 19–32)
Calcium: 8.1 mg/dL — ABNORMAL LOW (ref 8.4–10.5)
Creatinine, Ser: 3.87 mg/dL — ABNORMAL HIGH (ref 0.50–1.10)
GFR calc Af Amer: 12 mL/min — ABNORMAL LOW (ref >90)
GFR calc non Af Amer: 11 mL/min — ABNORMAL LOW (ref >90)
Glucose, Bld: 116 mg/dL — ABNORMAL HIGH (ref 70–99)
POTASSIUM: 4.5 meq/L (ref 3.7–5.3)
Sodium: 133 mEq/L — ABNORMAL LOW (ref 137–147)
TOTAL PROTEIN: 5.3 g/dL — AB (ref 6.0–8.3)

## 2014-06-10 LAB — GLUCOSE, CAPILLARY
GLUCOSE-CAPILLARY: 129 mg/dL — AB (ref 70–99)
GLUCOSE-CAPILLARY: 131 mg/dL — AB (ref 70–99)
Glucose-Capillary: 139 mg/dL — ABNORMAL HIGH (ref 70–99)
Glucose-Capillary: 111 mg/dL — ABNORMAL HIGH (ref 70–99)
Glucose-Capillary: 122 mg/dL — ABNORMAL HIGH (ref 70–99)
Glucose-Capillary: 118 mg/dL — ABNORMAL HIGH (ref 70–99)

## 2014-06-10 LAB — MAGNESIUM: Magnesium: 1.6 mg/dL (ref 1.5–2.5)

## 2014-06-10 LAB — PHOSPHORUS: Phosphorus: 3.4 mg/dL (ref 2.3–4.6)

## 2014-06-10 MED ORDER — FAT EMULSION 20 % IV EMUL
250.0000 mL | INTRAVENOUS | Status: DC
  Administered 2014-06-10: 250 mL via INTRAVENOUS
  Filled 2014-06-10: qty 250

## 2014-06-10 MED ORDER — LEVALBUTEROL HCL 0.63 MG/3ML IN NEBU
0.6300 mg | INHALATION_SOLUTION | Freq: Four times a day (QID) | RESPIRATORY_TRACT | Status: DC | PRN
  Administered 2014-06-12: 0.63 mg via RESPIRATORY_TRACT
  Filled 2014-06-10: qty 3

## 2014-06-10 MED ORDER — TRACE MINERALS CR-CU-F-FE-I-MN-MO-SE-ZN IV SOLN
INTRAVENOUS | Status: DC
  Administered 2014-06-10: 18:00:00 via INTRAVENOUS
  Filled 2014-06-10: qty 2000

## 2014-06-10 NOTE — Progress Notes (Signed)
Patient ID: [name]  female  VOJ:[id number]    DOB: 12-03-1940    DOA: 06/06/2014  PCP: Gilford Rile, MD  Brief narrative:  73 year old female patient with past history of afib not on chronic anticoagulation, COPD, diabetes, seizure disorder, chronic kidney disease on dialysis. She presented to the emergency department because of ongoing abdominal pain and increasing shortness of breath. Patient had recently been hospitalized and subsequently discharged on 8/6 for acute diverticulitis noted on CT scan. She was discharged on Cipro and Flagyl. She was readmitted on 8/24 were shortness of breath with subsequent diagnosed with C. difficile colitis and pneumonia. She was discharged on 8/29 on cefuroxime and oral vancomycin. She returned to the the ER on the same date. She was observed in the ER for 6 hours and was stable and had no indications for readmission so she was discharged from the ER.  She returns to the ER on 9/8 after noticing urine next is stool coming both from her vagina and rectum which was a new finding. She also reported increasing and worsening abdominal cramping with diarrhea. Also noticed increased shortness of breath for the past 24 hours. She reported an episode of diffuse anterior chest pain duration 30 minutes 24 hours prior to presentation but this has not recurred. She is on chronic oxygen at home at 3 L per minute. Upon their arrival her saturations were initially 67% on 3 L of oxygen. She was placed on 100% nonrebreather mask and saturations improved to 100%. Upon the admitting physician's exam the patient was stable on 4 L nasal cannula oxygen. In the ER her white count was 13,300 with a normal platelet count . She was afebrile.  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 Gen. surgery has been consulted. They anticipate likely surgical intervention next week. 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 difficile colitis - General surgery following - Continue a liquid diet, TNA, on IV Flagyl and Rocephin  - will need resection with temporary colostomy next week per sugery   Active problems: Recent C. difficile colitis  -Continue oral vancomycin, IV flagyl -most recent C. difficile PCR on 9/8 was negative   Acute respiratory failure with hypoxia/doubt HCAP. CT findings likely atelectasis versus fluid  Avoid adding abx given recent C. difficile colitis  Anemia with symptoms  -post 2 units pRBCs 9/8  CKD (chronic kidney disease) stage V requiring chronic dialysis  -Per nephrology-dialysis M/W/F   DM2 (diabetes mellitus, type 2)  -CBGs well controlled on SSI - Monitor closely on TPN, may need insulin added to TNA   HTN  Not on meds as outpt. Change metoprolol to prn   Paroxysmal atrial fibrillation  -Continue by mouth amiodarone-cont beta blocker IV for hypertensive control as well   Chronic respiratory failure with hypoxia due to:  A) Pulmonary HTN  B) OSA (obstructive sleep apnea)  C) COPD (chronic obstructive pulmonary disease)  -All compensated at this point-on 3 L nasal cannula at home   DVT Prophylaxis:  Code Status:  Family Communication:  Disposition:  Consultants:  General surgery  Nephrology  PC see him for line placement  Procedures:  Central line on 9/10  Antibiotics: Oral vancomycin  8/23 >>>  Flagyl  rocephin   Subjective: Patient seen and examined, no significant complaints, no fevers or chills or any abdominal pain  Objective: Weight change:   Intake/Output Summary (Last 24 hours) at 06/10/14 1153 Last data filed at 06/10/14 0833  Gross per 24 hour  Intake   3192 ml  Output    4196 ml  Net  -1004 ml   Blood pressure 137/49, pulse 64, temperature 98.1 F (36.7 C), temperature source Oral, resp. rate 18, height 5\' 4"  (1.626 m), weight 74.934 kg (165 lb 3.2 oz), SpO2 100.00%.  Physical Exam: General: Alert and awake, oriented x3, not in any acute distress. CVS: S1-S2 clear, no murmur rubs or gallops Chest: clear to auscultation bilaterally, no wheezing, rales or rhonchi Abdomen: soft nontender, nondistended, normal bowel sounds  Extremities: no cyanosis, clubbing or edema noted bilaterally Neuro: Cranial nerves II-XII intact, no focal neurological deficits  Lab Results: Basic Metabolic Panel:  Recent Labs Lab 06/09/14 0355 06/10/14 0905  NA 133* 133*  K 5.0 4.5  CL 96 97  CO2 24 27  GLUCOSE 134* 116*  BUN 26* 22  CREATININE 4.88* 3.87*  CALCIUM 8.1* 8.1*  MG 1.8 1.6  PHOS 5.2* 3.4   Liver Function Tests:  Recent Labs Lab 06/09/14 0355 06/10/14 0905  AST 14 10  ALT 8 6  ALKPHOS 85 80  BILITOT <0.2* <0.2*  PROT 5.4* 5.3*  ALBUMIN 2.2* 2.2*   No results found for this basename: LIPASE, AMYLASE,  in the last 168 hours No results found for this basename: AMMONIA,  in the last 168 hours CBC:  Recent Labs Lab 06/09/14 0355 06/10/14 0905  WBC 9.6 8.3  NEUTROABS 6.8  --   HGB 11.0* 10.6*  HCT 37.4 35.9*  MCV 95.9 96.2  PLT 191 147*   Cardiac Enzymes:  Recent Labs Lab 06/06/14 1713 06/07/14 0013  TROPONINI <0.30 <0.30   BNP: No components found with this basename: POCBNP,  CBG:  Recent Labs Lab 06/09/14 2044 06/09/14 2319 06/10/14 0348 06/10/14 0815 06/10/14 1144  GLUCAP 104* 112* 139* 129* 111*     Micro Results: Recent Results (from the past 240 hour(s))  MRSA PCR SCREENING     Status: None   Collection Time    06/06/14  9:16 PM      Result Value Ref Range Status   MRSA by PCR NEGATIVE  NEGATIVE Final   Comment:            The GeneXpert MRSA Assay (FDA     approved for NASAL specimens     only), is one  component of a     comprehensive MRSA colonization     surveillance program. It is not     intended to diagnose MRSA     infection nor to guide or     monitor treatment for     MRSA infections.  CLOSTRIDIUM DIFFICILE BY PCR     Status: None   Collection Time    06/06/14 10:44 PM      Result Value Ref Range Status   C difficile by pcr NEGATIVE  NEGATIVE Final    Studies/Results: Dg Chest 2 View  05/27/2014   CLINICAL DATA:  Multiple emesis, diarrhea, generalized weakness and fatigue today. Hemodialysis yesterday. Recent hospital discharge for pneumonia.  EXAM: CHEST  2 VIEW  COMPARISON:  05/21/2014  FINDINGS: Cardiac enlargement with pulmonary vascular congestion and mild perihilar edema. Findings are improving since previous study. No pleural effusions. No  pneumothorax. No focal consolidation. Calcification of the aorta.  IMPRESSION: Improving congestive changes since previous study.   Electronically Signed   By: Lucienne Capers M.D.   On: 05/27/2014 02:42   Ct Angio Chest Pe W/cm &/or Wo Cm  06/06/2014   CLINICAL DATA:  Shortness of breath, evaluate for pneumonia or kidney.  EXAM: CT ANGIOGRAPHY CHEST  CT ABDOMEN AND PELVIS WITH CONTRAST  TECHNIQUE: Multidetector CT imaging of the chest was performed using the standard protocol during bolus administration of intravenous contrast. Multiplanar CT image reconstructions and MIPs were obtained to evaluate the vascular anatomy. Multidetector CT imaging of the abdomen and pelvis was performed using the standard protocol during bolus administration of intravenous contrast.  CONTRAST:  171mL OMNIPAQUE IOHEXOL 350 MG/ML SOLN  COMPARISON:  CT abdomen pelvis dated 05/28/2014.  FINDINGS: CTA CHEST FINDINGS  No evidence of pulmonary embolism.  Interlobular septal thickening, upper lobe predominant, suspicious for mild interstitial edema. Associated cardiomegaly with small bilateral pleural effusions, right greater than left.  Patchy right lower lobe opacity  (series 4/ image 67), atelectasis versus pneumonia. Lingular and left lower lobe opacity, likely atelectasis.  No suspicious pulmonary nodules. Underlying moderate centrilobular and paraseptal emphysematous changes. No pneumothorax.  Visualized right thyroid is mildly nodular.  Coronary atherosclerosis. Atherosclerotic calcifications of the aortic arch.  Thoracic lymphadenopathy, including:  --10 mm short axis right paratracheal node (series 4/ image 28)  --12 mm short axis prevascular node (series 4/ image 35)  --14 mm short axis right paratracheal node (series 4/ image 38)  --16 mm short axis left hilar node (series 4/image 50)  --15 mm short axis subcarinal node (series 4/ image 51)  --11 mm short axis right perihilar node (series 4/ image 52)  Degenerative changes of the visualized thoracic spine.  CT ABDOMEN and PELVIS FINDINGS  Liver is notable for a 1.7 x 1.1 cm hypoenhancing lesion in the lateral segment left hepatic lobe (series 09/GGEZM 20), of uncertain etiology, but likely present on unenhanced CT from 2013.  Spleen is within the upper limits of normal for size.  Pancreas and right adrenal gland are within normal limits. Mild nodular thickening of the left adrenal gland, without discrete mass.  Gallbladder is mildly distended, without associated inflammatory changes. No intrahepatic or extrahepatic ductal dilatation.  Status post a right nephrectomy. 2.1 cm lateral interpolar left renal cyst (series 11/ image 38). Additional small left renal cyst. No hydronephrosis.  No evidence of bowel obstruction. Colonic diverticulosis, particularly involving the sigmoid colon. Mild pericolonic stranding/fluid along the distal sigmoid colon (series 11/image 74), raising the possibility of mild sigmoid diverticulitis. No drainable fluid collection/abscess. No free air.  Associated patent fistulous track between sigmoid colon and vaginal cuff in this patient status post hysterectomy (series 11/ images 75-80).  No  adnexal masses.  Bladder is mildly thick-walled but underdistended.  Atherosclerotic calcifications of the abdominal aorta and branch vessels.  No abdominopelvic ascites.  No suspicious abdominopelvic lymphadenopathy.  Mild degenerative changes of the lumbar spine.  Review of the MIP images confirms the above findings.  IMPRESSION: No evidence of pulmonary embolism.  Cardiomegaly with mild interstitial edema and small bilateral pleural effusions, right greater than left.  Patchy right lower lobe opacity, atelectasis versus pneumonia. Lingular and left lower lobe opacity, likely atelectasis.  Thoracic lymphadenopathy, possibly reactive, although indeterminate.  Possible mild sigmoid diverticulitis. No drainable fluid collection/abscess. No free air.  Associated patent fistulous track between the sigmoid colon and vaginal cuff.   Electronically Signed  By: Julian Hy M.D.   On: 06/06/2014 21:02   Ct Abdomen W Contrast  05/22/2014   CLINICAL DATA:  Abdominal pain. Right nephrectomy for renal cell carcinoma 10 years ago. Appendectomy.  EXAM: CT ABDOMEN WITH CONTRAST  TECHNIQUE: Multidetector CT imaging of the abdomen was performed using the standard protocol following bolus administration of intravenous contrast.  CONTRAST:  158mL OMNIPAQUE IOHEXOL 300 MG/ML  SOLN  COMPARISON:  05/01/2014  FINDINGS: Lower Chest: Bibasilar atelectasis. Mild cardiomegaly with small bilateral pleural effusions.  Abdomen: Normal liver. Splenic size upper normal with subcentimeter nonspecific peripheral hypo attenuating foci within. These are of indeterminate acuity, but appear new since the most recent contrast-enhanced study of 07/27/2012.  Normal stomach, pancreas. Gallbladder borderline distended, without pericholecystic edema or biliary ductal dilatation.  Normal right adrenal gland. Similar left adrenal thickening/nodularity.  Moderate left renal atrophy. Minimally complex lower pole left renal cyst (calcification  dependently). Other left renal lesions which are consistent with cysts. Right nephrectomy, without locally recurrent disease.  Advanced aortic and branch vessel atherosclerosis. No retroperitoneal or retrocrural adenopathy.  Extensive colonic diverticulosis. Normal terminal ileum. Normal abdominal small bowel without ascites.  Bones/Musculoskeletal:  No acute osseous abnormality.  IMPRESSION: 1. Multiple low-density subcapsular foci within the spleen. These are indeterminate, but appear new since 07/27/2012. Differential considerations include small volume splenic infarcts versus interval development of lymphangiomas. 2. No other explanation for abdominal pain. 3. Borderline gallbladder distention, without specific evidence of acute cholecystitis. 4. Right nephrectomy without locally recurrent or metastatic disease. 5. Small bilateral pleural effusions. 6. Advanced atherosclerosis.   Electronically Signed   By: Abigail Miyamoto M.D.   On: 05/22/2014 08:17   Ct Abdomen Pelvis W Contrast  06/06/2014   CLINICAL DATA:  Shortness of breath, evaluate for pneumonia or kidney.  EXAM: CT ANGIOGRAPHY CHEST  CT ABDOMEN AND PELVIS WITH CONTRAST  TECHNIQUE: Multidetector CT imaging of the chest was performed using the standard protocol during bolus administration of intravenous contrast. Multiplanar CT image reconstructions and MIPs were obtained to evaluate the vascular anatomy. Multidetector CT imaging of the abdomen and pelvis was performed using the standard protocol during bolus administration of intravenous contrast.  CONTRAST:  199mL OMNIPAQUE IOHEXOL 350 MG/ML SOLN  COMPARISON:  CT abdomen pelvis dated 05/28/2014.  FINDINGS: CTA CHEST FINDINGS  No evidence of pulmonary embolism.  Interlobular septal thickening, upper lobe predominant, suspicious for mild interstitial edema. Associated cardiomegaly with small bilateral pleural effusions, right greater than left.  Patchy right lower lobe opacity (series 4/ image 67),  atelectasis versus pneumonia. Lingular and left lower lobe opacity, likely atelectasis.  No suspicious pulmonary nodules. Underlying moderate centrilobular and paraseptal emphysematous changes. No pneumothorax.  Visualized right thyroid is mildly nodular.  Coronary atherosclerosis. Atherosclerotic calcifications of the aortic arch.  Thoracic lymphadenopathy, including:  --10 mm short axis right paratracheal node (series 4/ image 28)  --12 mm short axis prevascular node (series 4/ image 35)  --14 mm short axis right paratracheal node (series 4/ image 38)  --16 mm short axis left hilar node (series 4/image 50)  --15 mm short axis subcarinal node (series 4/ image 51)  --11 mm short axis right perihilar node (series 4/ image 52)  Degenerative changes of the visualized thoracic spine.  CT ABDOMEN and PELVIS FINDINGS  Liver is notable for a 1.7 x 1.1 cm hypoenhancing lesion in the lateral segment left hepatic lobe (series 69/CVELF 20), of uncertain etiology, but likely present on unenhanced CT from 2013.  Spleen is within the  upper limits of normal for size.  Pancreas and right adrenal gland are within normal limits. Mild nodular thickening of the left adrenal gland, without discrete mass.  Gallbladder is mildly distended, without associated inflammatory changes. No intrahepatic or extrahepatic ductal dilatation.  Status post a right nephrectomy. 2.1 cm lateral interpolar left renal cyst (series 11/ image 38). Additional small left renal cyst. No hydronephrosis.  No evidence of bowel obstruction. Colonic diverticulosis, particularly involving the sigmoid colon. Mild pericolonic stranding/fluid along the distal sigmoid colon (series 11/image 74), raising the possibility of mild sigmoid diverticulitis. No drainable fluid collection/abscess. No free air.  Associated patent fistulous track between sigmoid colon and vaginal cuff in this patient status post hysterectomy (series 11/ images 75-80).  No adnexal masses.  Bladder is  mildly thick-walled but underdistended.  Atherosclerotic calcifications of the abdominal aorta and branch vessels.  No abdominopelvic ascites.  No suspicious abdominopelvic lymphadenopathy.  Mild degenerative changes of the lumbar spine.  Review of the MIP images confirms the above findings.  IMPRESSION: No evidence of pulmonary embolism.  Cardiomegaly with mild interstitial edema and small bilateral pleural effusions, right greater than left.  Patchy right lower lobe opacity, atelectasis versus pneumonia. Lingular and left lower lobe opacity, likely atelectasis.  Thoracic lymphadenopathy, possibly reactive, although indeterminate.  Possible mild sigmoid diverticulitis. No drainable fluid collection/abscess. No free air.  Associated patent fistulous track between the sigmoid colon and vaginal cuff.   Electronically Signed   By: Julian Hy M.D.   On: 06/06/2014 21:02   Dg Chest Portable 1 View  06/08/2014   CLINICAL DATA:  Central line placement.  EXAM: PORTABLE CHEST - 1 VIEW  COMPARISON:  CT chest 06/06/2014.  Chest x-ray 06/06/2014.  FINDINGS: Right IJ line noted with tip projected over superior vena cava. Mediastinum and hilar structures normal. Cardiomegaly with pulmonary vascular prominence and diffuse interstitial prominence is with congestive heart for pleural interstitial edema. Small left pleural effusion. No pneumothorax. Surgical clips noted in the neck.  IMPRESSION: 1. Right IJ catheter noted with tip projected over superior vena cava. 2. Congestive heart failure with pulmonary edema and small left pleural effusion.   Electronically Signed   By: Marcello Moores  Register   On: 06/08/2014 15:08   Dg Chest Portable 1 View  06/06/2014   CLINICAL DATA:  Abdominal pain and shortness of breath  EXAM: PORTABLE CHEST - 1 VIEW  COMPARISON:  05/27/2014  FINDINGS: Cardiac shadow is stable in appearance. Increasing infiltrate is noted in the medial right lung base as well as in the lateral left lung base. Diffuse  interstitial changes remain with mild interstitial edema.  IMPRESSION: Increasing bibasilar infiltrates.   Electronically Signed   By: Inez Catalina M.D.   On: 06/06/2014 16:03   Dg Chest Port 1 View  05/21/2014   CLINICAL DATA:  Difficulty breathing and cough  EXAM: PORTABLE CHEST - 1 VIEW  COMPARISON:  March 01, 2014  FINDINGS: There is cardiomegaly with pulmonary venous hypertension. There is generalized interstitial edema. There is a small area of infiltrate in the lateral right base. No adenopathy. There is atherosclerotic change in aorta.  IMPRESSION: Findings felt to represent chronic congestive heart failure. Superimposed infiltrate lateral right base.   Electronically Signed   By: Lowella Grip M.D.   On: 05/21/2014 21:25   Ir Shuntogram/ Fistulagram Right Mod Sed  05/23/2014   INDICATION: Decreased flows at dialysis.  EXAM: DIALYSIS AV SHUNTOGRAM/FISTULAGRAM  COMPARISON:  Right upper extremity native AV fistulagram -09/20/2012  MEDICATIONS:  None.  CONTRAST:  50 mL OMNIPAQUE IOHEXOL 300 MG/ML  SOLN  ANESTHESIA/SEDATION: None  FLUOROSCOPY TIME:  30 seconds.  COMPLICATIONS: None immediate  PROCEDURE: The skin overlying the right upper arm native av fistula was prepped with Betadine in a sterile fashion, and a sterile drape was applied covering the operative field. A diagnostic shunt study was performed via an 18 gauge angiocatheter introduced into venous outflow. Venous drainage was assessed to the level of the central veins in the chest. Proximal shunt was studied by reflux maneuver with temporary compression of venous outflow.  The angiocath was removed and hemostasis was achieved with manual compression. A dressing was placed. The patient tolerated the procedure well without immediate post procedural complication.  FINDINGS: The native right upper arm brachiocephalic AV fistula is widely patent through the level of the cephalic arch. There is a very minimal collateralization of flow from the  hypertrophied cephalic vein to the deep venous system of the right upper arm though these venous collaterals do not result in significant steal phenomena.  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  The central venous system of the right upper extremity is widely patent to the level of the right atrium.  Reflux shuntogram demonstrates wide patency of the arterial limb and anastomosis.  IMPRESSION: Normally functioning right upper arm brachiocephalic AV fistula without peripheral or central stenosis. No intervention performed.  ACCESS: This access remains amenable to future percutaneous interventions as clinically indicated.   Electronically Signed   By: Sandi Mariscal M.D.   On: 05/23/2014 16:54    Medications: Scheduled Meds: . sodium chloride   Intravenous Once  . albuterol  2.5 mg Nebulization Once  . amiodarone  200 mg Oral Daily  . antiseptic oral rinse  7 mL Mouth Rinse BID  . calcitRIOL  0.25 mcg Oral Q M,W,F  . cefTRIAXone (ROCEPHIN)  IV  1 g Intravenous Q24H  . darbepoetin (ARANESP) injection - DIALYSIS  120 mcg Intravenous Q Wed-HD  . heparin subcutaneous  5,000 Units Subcutaneous 3 times per day  . insulin aspart  0-15 Units Subcutaneous TID WC  . metronidazole  500 mg Intravenous Q6H  . ondansetron (ZOFRAN) IV  4 mg Intravenous 4 times per day  . rOPINIRole  1 mg Oral BID  . sodium chloride  3 mL Intravenous Q12H  . vancomycin  125 mg Oral 4 times per day      LOS: 4 days   Amadi Yoshino M.D. [facility] 06/10/2014, 11:53 AM Pager: [phone]  If 7PM-7AM, please contact night-coverage [email or web address] Password [email or web address]  **Disclaimer: This note was dictated with voice recognition software. Similar sounding words can inadvertently be transcribed and this note may contain transcription errors which may not have been corrected upon publication of note.**

## 2014-06-10 NOTE — Progress Notes (Signed)
Seen and agree  

## 2014-06-10 NOTE — Progress Notes (Signed)
[facility] Progress Note   Subjective: no complaints  Filed Vitals:   06/09/14 2022 06/09/14 2304 06/10/14 0514 06/10/14 0932  BP: 137/45  138/46 137/49  Pulse: 65  68 64  Temp: 97.9 F (36.6 C)  98 F (36.7 C) 98.1 F (36.7 C)  TempSrc: Oral  Oral Oral  Resp: 20  18 18   Height: 5\' 4"  (1.626 m)     Weight: 73.9 kg (162 lb 14.7 oz)     SpO2: 97% 98% 98% 100%   Exam: Alert, no distress, lying 30deg, no distress No jvd Chest mostly clear, mild rales at bases RRR no MRG Abd soft, NTND, +BS No LE edema RUA AVF +bruit, bruising  Neuro is nf, Ox 3  HD: MWF [place] 4h   72kg   2/2.25 [place]  400/A1.5  RUA AVF 15ga   Prof 2   Heparin none Calcitriol 0.25 ug po TIW, Aranesp 120 ug q week Pth 384, P 4.4, tsat 30, eKT/V 1.46  Assessment: 1 Abd pain / RV fistula - hx recent diverticulitis episode, on IV AB and TNA now 2 C dificile infection on po vanc 3 ESRD on HD 4 Vol excess / pulm edema- not symptomatic 5 HPTH resume phoslo when eating, cont vit D 6 Anemia s/p prbc x 2, cont aranesp 120/wk 7 DM on oral meds 8 Afib on amio, IV MTP while npo 9 COPD on continuous O2 at home and here   Plan- extra HD today for volume, please limit fluid intake as pt is already getting TNA and she will go into fluid overload easily    Kelly Splinter MD  pager [phone]    cell [phone]  06/10/2014, 11:42 AM     Recent Labs Lab 06/08/14 0500 06/09/14 0355 06/10/14 0905  NA 137 133* 133*  K 4.6 5.0 4.5  CL 97 96 97  CO2 27 24 27   GLUCOSE 88 134* 116*  BUN 16 26* 22  CREATININE 3.72* 4.88* 3.87*  CALCIUM 8.2* 8.1* 8.1*  PHOS  --  5.2* 3.4    Recent Labs Lab 06/08/14 0500 06/09/14 0355 06/10/14 0905  AST 14 14 10   ALT 9 8 6   ALKPHOS 87 85 80  BILITOT 0.4 <0.2* <0.2*  PROT 5.4* 5.4* 5.3*  ALBUMIN 2.1* 2.2* 2.2*    Recent Labs Lab 06/06/14 1620  06/08/14 0500 06/09/14 0355 06/10/14 0905  WBC 12.9*  < > 10.0 9.6 8.3  NEUTROABS 10.7*  --   --  6.8  --    HGB 8.7*  10.2*  < > 10.7* 11.0* 10.6*  HCT 30.6*  30.0*  < > 35.6* 37.4 35.9*  MCV 98.1  < > 95.2 95.9 96.2  PLT 210  < > 168 191 147*  < > = values in this interval not displayed. . sodium chloride   Intravenous Once  . albuterol  2.5 mg Nebulization Once  . amiodarone  200 mg Oral Daily  . antiseptic oral rinse  7 mL Mouth Rinse BID  . calcitRIOL  0.25 mcg Oral Q M,W,F  . cefTRIAXone (ROCEPHIN)  IV  1 g Intravenous Q24H  . darbepoetin (ARANESP) injection - DIALYSIS  120 mcg Intravenous Q Wed-HD  . heparin subcutaneous  5,000 Units Subcutaneous 3 times per day  . insulin aspart  0-15 Units Subcutaneous TID WC  . metronidazole  500 mg Intravenous Q6H  . ondansetron (ZOFRAN) IV  4 mg Intravenous 4 times per day  . rOPINIRole  1 mg Oral  BID  . sodium chloride  3 mL Intravenous Q12H  . vancomycin  125 mg Oral 4 times per day   . sodium chloride 10 mL/hr at 06/09/14 0830  . TPN (CLINIMIX) Adult without lytes 60 mL/hr at 06/09/14 1707   And  . fat emulsion 250 mL (06/09/14 1707)  . TPN (CLINIMIX) Adult without lytes     And  . fat emulsion     sodium chloride, sodium chloride, sodium chloride, sodium chloride, acetaminophen, feeding supplement (NEPRO CARB STEADY), heparin, ipratropium, levalbuterol, lidocaine (PF), lidocaine-prilocaine, metoprolol, morphine injection, ondansetron (ZOFRAN) IV, ondansetron, pentafluoroprop-tetrafluoroeth

## 2014-06-10 NOTE — Progress Notes (Signed)
PARENTERAL NUTRITION CONSULT NOTE - FOLLOW UP  Pharmacy Consult for TPN Indication: Colovesical fistula  Allergies  Allergen Reactions  . Lipitor [Atorvastatin] Other (See Comments)    Causes weakness and drowsiness  . Morphine And Related Nausea And Vomiting  . Nsaids Other (See Comments)    Unknown reported by previous hospital    Patient Measurements: Height: 5\' 4"  (162.6 cm) Weight: 162 lb 14.7 oz (73.9 kg) IBW/kg (Calculated) : 54.7 Adjusted Body Weight: 61kg   Vital Signs: Temp: 98 F (36.7 C) (09/12 0514) Temp src: Oral (09/12 0514) BP: 138/46 mmHg (09/12 0514) Pulse Rate: 68 (09/12 0514) Intake/Output from previous day: 09/11 0701 - 09/12 0700 In: 3177 [P.O.:420; I.V.:95; IV Piggyback:150; TPN:2512] Out: 4196  Intake/Output from this shift:    Labs:  Recent Labs  06/08/14 0500 06/08/14 1030 06/09/14 0355  WBC 10.0  --  9.6  HGB 10.7*  --  11.0*  HCT 35.6*  --  37.4  PLT 168  --  191  APTT  --  33  --   INR  --  1.17  --      Recent Labs  06/08/14 0500 06/09/14 0355  NA 137 133*  K 4.6 5.0  CL 97 96  CO2 27 24  GLUCOSE 88 134*  BUN 16 26*  CREATININE 3.72* 4.88*  CALCIUM 8.2* 8.1*  MG  --  1.8  PHOS  --  5.2*  PROT 5.4* 5.4*  ALBUMIN 2.1* 2.2*  AST 14 14  ALT 9 8  ALKPHOS 87 85  BILITOT 0.4 <0.2*  PREALBUMIN 8.3* 8.1*  TRIG  --  88   Estimated Creatinine Clearance: 10.3 ml/min (by C-G formula based on Cr of 4.88).    Recent Labs  06/09/14 2044 06/09/14 2319 06/10/14 0348  GLUCAP 104* 112* 139*    Insulin Requirements in the past 24 hours:  No SSI required  Current Nutrition:  Clear liquids Clinimix 5/15 at 40 ml/hr + 20% lipid emulsion at 10 ml/hr provides 48g protein and 1181 kCal per day.  Assessment: 53 YOF who presented to the ED with colovesical fistula which was noted on CT. General surgery was consulted and they recommended bowel rest and antibiotics. Pt has h/o ESRD on dialysis   GI: Acute abdominal pain with  colovesical fistula. Gen surg suspects patient will need resection with temporary colostomy next week.  She is taking in some clear liquids and TPN is being used to optimize nutrition prior to surgery. Planning to advance to full liquids soon. Pt tolerated TPN rate increase yesterday Endo: h/o DM, CBGs 112-139, on moderate scale SSI  Lytes: K 4.5, Na 133, Phos 3.4, Mag 1.6 - on electrolyte-free TPN Renal: ESRD on HD MWF per nephrology.   Pulm: COPD, 3 L [place]  Cards: HTN, Afib, HLD, BP 137/49, HR 64 Hepatobil: LFTs wnl  Neuro: h/o seizures;  ID: On D#4 Rocephin, flagyl and oral vancomycin, WBC wnl, pt is afebrile  Best Practices: Heparin [place]  TPN Access:  R IJ CVC 06/08/14 TPN day#: 3 (started 9/10)  Nutrition Goals established by RD: Kcal: 2000-2100  Protein: 85-95g   Plan:  -Increase Clinimix 5/15 to 75 ml/hr and continue 20% lipid emulsion at 10 ml/hr.  This will provide 90g (100% goal) protein and 1758 kCal (88% goal) per day. Will not advance TPN any further  -Continue with electrolyte-free TPN.  Check Bmet, Magnesium, Phosphorus with AM labs. -Monitor daily weights  -Daily MVI and TE  -Monitor for fluid balance,  especially since she is HD-dependent. Will aim to provide ~ 85% of goal calories to avoid fluid overload -Short extra HD planned for today since pt is on TNA  Follow CBGs and insulin requirements.  Albertina Parr, PharmD.  Clinical Pharmacist Pager [phone]

## 2014-06-10 NOTE — Progress Notes (Signed)
Patient ID: [name], female   DOB: 04/04/1941, 73 y.o.   MRN: [id number]     [facility]      [address] [place], [place] [address]    Phone: [phone] FAX: [phone]     Subjective: No pain.  VSS.  Afebrile.  Normal white count.  Denies any increase in vaginal drainage.  Having BMs.    Objective:  Vital signs:  Filed Vitals:   06/09/14 2022 06/09/14 2304 06/10/14 0514 06/10/14 0932  BP: 137/45  138/46 137/49  Pulse: 65  68 64  Temp: 97.9 F (36.6 C)  98 F (36.7 C) 98.1 F (36.7 C)  TempSrc: Oral  Oral Oral  Resp: _0 Height: _1  (1.626 m)     Weight: 162 lb 14.7 oz (73.9 kg)     SpO2: 97% 98% 98% 100%    Last BM Date: 06/09/14  Intake/Output   Yesterday:  09/11 0701 - 09/12 0700 In: 3177 [P.O.:420; I.V.:95; IV Piggyback:150; TPN:2512] Out: 4196  This shift:    I/O last 3 completed shifts: In: 4077 [P.O.:420; I.V.:195; IV Piggyback:350] Out: 2725 [Other:4196]     Physical Exam:  General: Pt awake/alert/oriented x4 in no acute distress  Abdomen: Soft. Nondistended. Non tender. No evidence of peritonitis. No incarcerated hernias.   Problem List:   Active Problems:   CKD (chronic kidney disease) stage V requiring chronic dialysis   DM2 (diabetes mellitus, type 2)   C. difficile colitis   Chronic respiratory failure with hypoxia   HTN (hypertension)   Pulmonary HTN   OSA (obstructive sleep apnea)   COPD (chronic obstructive pulmonary disease)   Acute respiratory failure with hypoxia   HCAP (healthcare-associated pneumonia)   Abdominal pain, acute   Colovesical fistula    Results:   Labs: Results for orders placed during the hospital encounter of 06/06/14 (from the past 48 hour(s))  APTT     Status: None   Collection Time    06/08/14 10:30 AM      Result Value Ref Range   aPTT 33  24 - 37 seconds  PROTIME-INR     Status: None   Collection Time    06/08/14 10:30 AM       Result Value Ref Range   Prothrombin Time 14.9  11.6 - 15.2 seconds   INR 1.17  0.00 - 1.49  GLUCOSE, CAPILLARY     Status: None   Collection Time    06/08/14 11:54 AM      Result Value Ref Range   Glucose-Capillary 95  70 - 99 mg/dL  GLUCOSE, CAPILLARY     Status: None   Collection Time    06/08/14  4:48 PM      Result Value Ref Range   Glucose-Capillary 93  70 - 99 mg/dL  GLUCOSE, CAPILLARY     Status: Abnormal   Collection Time    06/08/14  9:34 PM      Result Value Ref Range   Glucose-Capillary 122 (*) 70 - 99 mg/dL  COMPREHENSIVE METABOLIC PANEL     Status: Abnormal   Collection Time    06/09/14  3:55 AM      Result Value Ref Range   Sodium 133 (*) 137 - 147 mEq/L   Potassium 5.0  3.7 - 5.3 mEq/L   Chloride 96  96 - 112 mEq/L   CO2 24  19 - 32 mEq/L   Glucose, Bld 134 (*)  70 - 99 mg/dL   BUN 26 (*) 6 - 23 mg/dL   Creatinine, Ser 4.88 (*) 0.50 - 1.10 mg/dL   Calcium 8.1 (*) 8.4 - 10.5 mg/dL   Total Protein 5.4 (*) 6.0 - 8.3 g/dL   Albumin 2.2 (*) 3.5 - 5.2 g/dL   AST 14  0 - 37 U/L   ALT 8  0 - 35 U/L   Alkaline Phosphatase 85  39 - 117 U/L   Total Bilirubin <0.2 (*) 0.3 - 1.2 mg/dL   GFR calc non Af Amer 8 (*) >90 mL/min   GFR calc Af Amer 9 (*) >90 mL/min   Comment: (NOTE)     The eGFR has been calculated using the CKD EPI equation.     This calculation has not been validated in all clinical situations.     eGFR's persistently <90 mL/min signify possible Chronic Kidney     Disease.   Anion gap 13  5 - 15  PREALBUMIN     Status: Abnormal   Collection Time    06/09/14  3:55 AM      Result Value Ref Range   Prealbumin 8.1 (*) 17.0 - 34.0 mg/dL   Comment: Performed at [facility]     Status: None   Collection Time    06/09/14  3:55 AM      Result Value Ref Range   Magnesium 1.8  1.5 - 2.5 mg/dL  PHOSPHORUS     Status: Abnormal   Collection Time    06/09/14  3:55 AM      Result Value Ref Range   Phosphorus 5.2 (*) 2.3 - 4.6 mg/dL   TRIGLYCERIDES     Status: None   Collection Time    06/09/14  3:55 AM      Result Value Ref Range   Triglycerides 88  <150 mg/dL  CBC     Status: Abnormal   Collection Time    06/09/14  3:55 AM      Result Value Ref Range   WBC 9.6  4.0 - 10.5 K/uL   RBC 3.90  3.87 - 5.11 MIL/uL   Hemoglobin 11.0 (*) 12.0 - 15.0 g/dL   HCT 37.4  36.0 - 46.0 %   MCV 95.9  78.0 - 100.0 fL   MCH 28.2  26.0 - 34.0 pg   MCHC 29.4 (*) 30.0 - 36.0 g/dL   RDW 18.5 (*) 11.5 - 15.5 %   Platelets 191  150 - 400 K/uL  DIFFERENTIAL     Status: Abnormal   Collection Time    06/09/14  3:55 AM      Result Value Ref Range   Neutrophils Relative % 70  43 - 77 %   Neutro Abs 6.8  1.7 - 7.7 K/uL   Lymphocytes Relative 15  12 - 46 %   Lymphs Abs 1.5  0.7 - 4.0 K/uL   Monocytes Relative 9  3 - 12 %   Monocytes Absolute 0.8  0.1 - 1.0 K/uL   Eosinophils Relative 6 (*) 0 - 5 %   Eosinophils Absolute 0.5  0.0 - 0.7 K/uL   Basophils Relative 0  0 - 1 %   Basophils Absolute 0.0  0.0 - 0.1 K/uL  GLUCOSE, CAPILLARY     Status: Abnormal   Collection Time    06/09/14  7:44 AM      Result Value Ref Range   Glucose-Capillary 122 (*) 70 - 99 mg/dL  GLUCOSE, CAPILLARY     Status: Abnormal   Collection Time    06/09/14  1:36 PM      Result Value Ref Range   Glucose-Capillary 112 (*) 70 - 99 mg/dL  GLUCOSE, CAPILLARY     Status: Abnormal   Collection Time    06/09/14  4:31 PM      Result Value Ref Range   Glucose-Capillary 129 (*) 70 - 99 mg/dL  GLUCOSE, CAPILLARY     Status: Abnormal   Collection Time    06/09/14  8:44 PM      Result Value Ref Range   Glucose-Capillary 104 (*) 70 - 99 mg/dL   Comment 1 Notify RN    GLUCOSE, CAPILLARY     Status: Abnormal   Collection Time    06/09/14 11:19 PM      Result Value Ref Range   Glucose-Capillary 112 (*) 70 - 99 mg/dL   Comment 1 Notify RN    GLUCOSE, CAPILLARY     Status: Abnormal   Collection Time    06/10/14  3:48 AM      Result Value Ref Range    Glucose-Capillary 139 (*) 70 - 99 mg/dL   Comment 1 Notify RN    GLUCOSE, CAPILLARY     Status: Abnormal   Collection Time    06/10/14  8:15 AM      Result Value Ref Range   Glucose-Capillary 129 (*) 70 - 99 mg/dL  CBC     Status: Abnormal   Collection Time    06/10/14  9:05 AM      Result Value Ref Range   WBC 8.3  4.0 - 10.5 K/uL   RBC 3.73 (*) 3.87 - 5.11 MIL/uL   Hemoglobin 10.6 (*) 12.0 - 15.0 g/dL   HCT 35.9 (*) 36.0 - 46.0 %   MCV 96.2  78.0 - 100.0 fL   MCH 28.4  26.0 - 34.0 pg   MCHC 29.5 (*) 30.0 - 36.0 g/dL   RDW 17.8 (*) 11.5 - 15.5 %   Platelets 147 (*) 150 - 400 K/uL    Imaging / Studies: Dg Chest Portable 1 View  06/08/2014   CLINICAL DATA:  Central line placement.  EXAM: PORTABLE CHEST - 1 VIEW  COMPARISON:  CT chest 06/06/2014.  Chest x-ray 06/06/2014.  FINDINGS: Right IJ line noted with tip projected over superior vena cava. Mediastinum and hilar structures normal. Cardiomegaly with pulmonary vascular prominence and diffuse interstitial prominence is with congestive heart for pleural interstitial edema. Small left pleural effusion. No pneumothorax. Surgical clips noted in the neck.  IMPRESSION: 1. Right IJ catheter noted with tip projected over superior vena cava. 2. Congestive heart failure with pulmonary edema and small left pleural effusion.   Electronically Signed   By: Marcello Moores  Register   On: 06/08/2014 15:08    Medications / Allergies:  Scheduled Meds: . sodium chloride   Intravenous Once  . albuterol  2.5 mg Nebulization Once  . amiodarone  200 mg Oral Daily  . antiseptic oral rinse  7 mL Mouth Rinse BID  . calcitRIOL  0.25 mcg Oral Q M,W,F  . cefTRIAXone (ROCEPHIN)  IV  1 g Intravenous Q24H  . darbepoetin (ARANESP) injection - DIALYSIS  120 mcg Intravenous Q Wed-HD  . heparin subcutaneous  5,000 Units Subcutaneous 3 times per day  . insulin aspart  0-15 Units Subcutaneous TID WC  . levalbuterol  0.63 mg Nebulization TID  . metronidazole  500 mg  Intravenous  Q6H  . ondansetron (ZOFRAN) IV  4 mg Intravenous 4 times per day  . rOPINIRole  1 mg Oral BID  . sodium chloride  3 mL Intravenous Q12H  . vancomycin  125 mg Oral 4 times per day   Continuous Infusions: . sodium chloride 10 mL/hr at 06/09/14 0830  . TPN (CLINIMIX) Adult without lytes 60 mL/hr at 06/09/14 1707   And  . fat emulsion 250 mL (06/09/14 1707)   PRN Meds:.sodium chloride, sodium chloride, sodium chloride, sodium chloride, acetaminophen, feeding supplement (NEPRO CARB STEADY), heparin, ipratropium, lidocaine (PF), lidocaine-prilocaine, metoprolol, morphine injection, ondansetron (ZOFRAN) IV, ondansetron, pentafluoroprop-tetrafluoroeth  Antibiotics: Anti-infectives   Start     Dose/Rate Route Frequency Ordered Stop   06/08/14 1200  fluconazole (DIFLUCAN) tablet 100 mg  Status:  Discontinued     100 mg Oral Every 48 hours 06/06/14 2117 06/07/14 0729   06/07/14 1400  cefTRIAXone (ROCEPHIN) 1 g in dextrose 5 % 50 mL IVPB     1 g 100 mL/hr over 30 Minutes Intravenous Every 24 hours 06/07/14 1208     06/07/14 1200  metroNIDAZOLE (FLAGYL) IVPB 500 mg     500 mg 100 mL/hr over 60 Minutes Intravenous Every 6 hours 06/07/14 1143     06/07/14 1000  fluconazole (DIFLUCAN) tablet 50 mg  Status:  Discontinued     50 mg Oral Every 48 hours 06/06/14 2121 06/07/14 0729   06/07/14 0000  vancomycin (VANCOCIN) 50 mg/mL oral solution 125 mg     125 mg Oral 4 times per day 06/06/14 2122     06/06/14 2200  vancomycin (VANCOCIN) 125 MG capsule 125 mg  Status:  Discontinued     125 mg Oral 4 times daily 06/06/14 2117 06/06/14 2121   06/06/14 1645  vancomycin (VANCOCIN) 1,250 mg in sodium chloride 0.9 % 250 mL IVPB  Status:  Discontinued     1,250 mg 166.7 mL/hr over 90 Minutes Intravenous  Once 06/06/14 1641 06/06/14 1735   06/06/14 1645  piperacillin-tazobactam (ZOSYN) IVPB 2.25 g  Status:  Discontinued     2.25 g 100 mL/hr over 30 Minutes Intravenous 3 times per day 06/06/14 1641  06/06/14 1719        Assessment/Plan  Anemia  ESRD/HD  CAD/Atrial fibrillation  COPD on home 02  HCAP DM  Hx C diff colitis--negative 9/8   Mild sigmoid diverticulitis  Colovaginal fistula  -no increase in drainage with initiating clears.  Will consider [place] diet -she will likely need surgery next week -continue TPN  -continue atbx  -further management per primary team  -will follow   Erby Pian, [facility] Pager [phone](7A-4:30P) For consults and floor pages call [phone](7A-4:30P)  06/10/2014 9:49 AM

## 2014-06-11 LAB — GLUCOSE, CAPILLARY
GLUCOSE-CAPILLARY: 125 mg/dL — AB (ref 70–99)
GLUCOSE-CAPILLARY: 77 mg/dL (ref 70–99)
GLUCOSE-CAPILLARY: 92 mg/dL (ref 70–99)
Glucose-Capillary: 121 mg/dL — ABNORMAL HIGH (ref 70–99)
Glucose-Capillary: 85 mg/dL (ref 70–99)
Glucose-Capillary: 101 mg/dL — ABNORMAL HIGH (ref 70–99)

## 2014-06-11 LAB — BASIC METABOLIC PANEL
Anion gap: 9 (ref 5–15)
BUN: 23 mg/dL (ref 6–23)
CALCIUM: 8.2 mg/dL — AB (ref 8.4–10.5)
CO2: 26 mEq/L (ref 19–32)
CREATININE: 3.26 mg/dL — AB (ref 0.50–1.10)
Chloride: 97 mEq/L (ref 96–112)
GFR calc Af Amer: 15 mL/min — ABNORMAL LOW (ref >90)
GFR calc non Af Amer: 13 mL/min — ABNORMAL LOW (ref >90)
Glucose, Bld: 122 mg/dL — ABNORMAL HIGH (ref 70–99)
Potassium: 4.3 mEq/L (ref 3.7–5.3)
SODIUM: 132 meq/L — AB (ref 137–147)

## 2014-06-11 LAB — MAGNESIUM: Magnesium: 1.5 mg/dL (ref 1.5–2.5)

## 2014-06-11 LAB — PHOSPHORUS: Phosphorus: 2.2 mg/dL — ABNORMAL LOW (ref 2.3–4.6)

## 2014-06-11 MED ORDER — FAT EMULSION 20 % IV EMUL
250.0000 mL | INTRAVENOUS | Status: DC
Start: 2014-06-11 — End: 2014-06-11
  Filled 2014-06-11: qty 250

## 2014-06-11 MED ORDER — TRACE MINERALS CR-CU-F-FE-I-MN-MO-SE-ZN IV SOLN
INTRAVENOUS | Status: DC
Start: 2014-06-11 — End: 2014-06-11
  Filled 2014-06-11: qty 2000

## 2014-06-11 MED ORDER — SODIUM CHLORIDE 0.9 % IJ SOLN: 10.0000 mL | INTRAMUSCULAR | Status: DC | PRN

## 2014-06-11 NOTE — Plan of Care (Signed)
Problem: Discharge Progression Outcomes Goal: Home O2 if indicated Outcome: Completed/Met Date Met:  06/11/14 Pt uses oxygen at home

## 2014-06-11 NOTE — Progress Notes (Signed)
Pt vomited previous full liquid tray.  Given scheduled zofran and states she is feeling much better.  Will continue to monitor. Syliva Overman

## 2014-06-11 NOTE — Progress Notes (Signed)
Patient ID: [name]  female  PZW:[id number]    DOB: 03-07-1941    DOA: 06/06/2014  PCP: Gilford Rile, MD  Brief narrative:  73 year old female patient with past history of afib not on chronic anticoagulation, COPD, diabetes, seizure disorder, chronic kidney disease on dialysis. She presented to the emergency department because of ongoing abdominal pain and increasing shortness of breath. Patient had recently been hospitalized and subsequently discharged on 8/6 for acute diverticulitis noted on CT scan. She was discharged on Cipro and Flagyl. She was readmitted on 8/24 were shortness of breath with subsequent diagnosed with C. difficile colitis and pneumonia. She was discharged on 8/29 on cefuroxime and oral vancomycin. She returned to the the ER on the same date. She was observed in the ER for 6 hours and was stable and had no indications for readmission so she was discharged from the ER.  She returns to the ER on 9/8 after noticing urine next is stool coming both from her vagina and rectum which was a new finding. She also reported increasing and worsening abdominal cramping with diarrhea. Also noticed increased shortness of breath for the past 24 hours. She reported an episode of diffuse anterior chest pain duration 30 minutes 24 hours prior to presentation but this has not recurred. She is on chronic oxygen at home at 3 L per minute. Upon their arrival her saturations were initially 67% on 3 L of oxygen. She was placed on 100% nonrebreather mask and saturations improved to 100%. Upon the admitting physician's exam the patient was stable on 4 L nasal cannula oxygen. In the ER her white count was 13,300 with a normal platelet count . She was afebrile.  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 Gen. surgery has been consulted. They anticipate likely surgical intervention next week. 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 difficile colitis - General surgery following - Continue clears, on IV Flagyl and Rocephin  - will need resection with temporary colostomy next week per sugery  - TNA to be placed on hold, volume overloaded  Active problems: Recent C. difficile colitis  -Continue oral vancomycin, IV flagyl -most recent C. difficile PCR on 9/8 was negative   Acute respiratory failure with hypoxia/doubt HCAP. CT findings likely atelectasis versus fluid  Avoid adding abx given recent C. difficile colitis  Anemia with symptoms  -post 2 units pRBCs 9/8  CKD (chronic kidney disease) stage V requiring chronic dialysis  -Per nephrology-dialysis M/W/F  - d/w Dr Jonnie Finner, will place TNA on hold, volume overloaded  DM2 (diabetes mellitus, type 2)  -CBGs well controlled on SSI  HTN  Not on meds as outpt. Change metoprolol to prn   Paroxysmal atrial fibrillation  -Continue by mouth amiodarone-cont beta blocker IV for hypertensive control as well   Chronic respiratory failure with hypoxia due to:  A) Pulmonary HTN  B) OSA (obstructive sleep apnea)  C) COPD (chronic obstructive pulmonary disease)  -All compensated at this point-on 3 L nasal cannula at home   DVT Prophylaxis:  Code Status:  Family Communication:  Disposition:  Consultants:  General surgery  Nephrology  PCCM for line placement  Procedures:  Central line on 9/10  Antibiotics: Oral vancomycin 8/23 >>>  Flagyl  rocephin   Subjective: Patient seen and examined, no significant complaints, no fevers or chills  Objective: Weight change: -2.066 kg (-4 lb 8.9 oz)  Intake/Output Summary (Last 24 hours) at 06/11/14 1126 Last data filed at 06/11/14 0522  Gross per 24  hour  Intake    443 ml  Output   2500 ml  Net  -2057 ml   Blood pressure 143/49, pulse 71, temperature 97.9 F (36.6 C), temperature source Oral, resp. rate 18, height 5\' 4"  (1.626 m), weight 73.2 kg (161 lb 6 oz), SpO2 95.00%.  Physical Exam: General: Alert and awake, oriented x3, not in any acute distress. CVS: S1-S2 clear, no murmur rubs or gallops Chest: Decreased breath sound at the bases  Abdomen: soft nontender, nondistended, normal bowel sounds  Extremities: no cyanosis, clubbing or edema noted bilaterally   Lab Results: Basic Metabolic Panel:  Recent Labs Lab 06/10/14 0905 06/11/14 0542  NA 133* 132*  K 4.5 4.3  CL 97 97  CO2 27 26  GLUCOSE 116* 122*  BUN 22 23  CREATININE 3.87* 3.26*  CALCIUM 8.1* 8.2*  MG 1.6 1.5  PHOS 3.4 2.2*   Liver Function Tests:  Recent Labs Lab 06/09/14 0355 06/10/14 0905  AST 14 10  ALT 8 6  ALKPHOS 85 80  BILITOT <0.2* <0.2*  PROT 5.4* 5.3*  ALBUMIN 2.2* 2.2*   No results found for this basename: LIPASE, AMYLASE,  in the last 168 hours No results found for this basename: AMMONIA,  in the last 168 hours CBC:  Recent Labs Lab 06/09/14 0355 06/10/14 0905  WBC 9.6 8.3  NEUTROABS 6.8  --   HGB 11.0* 10.6*  HCT 37.4 35.9*  MCV 95.9 96.2  PLT 191 147*   Cardiac Enzymes:  Recent Labs Lab 06/06/14 1713 06/07/14 0013  TROPONINI <0.30 <0.30   BNP: No components found with this basename: POCBNP,  CBG:  Recent Labs Lab 06/10/14 1839 06/10/14 1944 06/10/14 2331 06/11/14 0333 06/11/14 0812  GLUCAP 122* 131* 118* 125* 121*     Micro Results: Recent Results (from the past 240 hour(s))  MRSA PCR SCREENING     Status: None   Collection Time    06/06/14  9:16 PM      Result Value Ref Range Status   MRSA by PCR NEGATIVE  NEGATIVE Final   Comment:            The GeneXpert MRSA Assay (FDA     approved for NASAL specimens     only), is one component of a     comprehensive MRSA colonization     surveillance  program. It is not     intended to diagnose MRSA     infection nor to guide or     monitor treatment for     MRSA infections.  CLOSTRIDIUM DIFFICILE BY PCR     Status: None   Collection Time    06/06/14 10:44 PM      Result Value Ref Range Status   C difficile by pcr NEGATIVE  NEGATIVE Final    Studies/Results: Dg Chest 2 View  05/27/2014   CLINICAL DATA:  Multiple emesis, diarrhea, generalized weakness and fatigue today. Hemodialysis yesterday. Recent hospital discharge for pneumonia.  EXAM: CHEST  2 VIEW  COMPARISON:  05/21/2014  FINDINGS: Cardiac enlargement with pulmonary vascular congestion and mild perihilar edema. Findings are improving since previous study. No pleural effusions. No pneumothorax. No focal consolidation. Calcification  of the aorta.  IMPRESSION: Improving congestive changes since previous study.   Electronically Signed   By: Lucienne Capers M.D.   On: 05/27/2014 02:42   Ct Angio Chest Pe W/cm &/or Wo Cm  06/06/2014   CLINICAL DATA:  Shortness of breath, evaluate for pneumonia or kidney.  EXAM: CT ANGIOGRAPHY CHEST  CT ABDOMEN AND PELVIS WITH CONTRAST  TECHNIQUE: Multidetector CT imaging of the chest was performed using the standard protocol during bolus administration of intravenous contrast. Multiplanar CT image reconstructions and MIPs were obtained to evaluate the vascular anatomy. Multidetector CT imaging of the abdomen and pelvis was performed using the standard protocol during bolus administration of intravenous contrast.  CONTRAST:  147mL OMNIPAQUE IOHEXOL 350 MG/ML SOLN  COMPARISON:  CT abdomen pelvis dated 05/28/2014.  FINDINGS: CTA CHEST FINDINGS  No evidence of pulmonary embolism.  Interlobular septal thickening, upper lobe predominant, suspicious for mild interstitial edema. Associated cardiomegaly with small bilateral pleural effusions, right greater than left.  Patchy right lower lobe opacity (series 4/ image 67), atelectasis versus pneumonia. Lingular and left  lower lobe opacity, likely atelectasis.  No suspicious pulmonary nodules. Underlying moderate centrilobular and paraseptal emphysematous changes. No pneumothorax.  Visualized right thyroid is mildly nodular.  Coronary atherosclerosis. Atherosclerotic calcifications of the aortic arch.  Thoracic lymphadenopathy, including:  --10 mm short axis right paratracheal node (series 4/ image 28)  --12 mm short axis prevascular node (series 4/ image 35)  --14 mm short axis right paratracheal node (series 4/ image 38)  --16 mm short axis left hilar node (series 4/image 50)  --15 mm short axis subcarinal node (series 4/ image 51)  --11 mm short axis right perihilar node (series 4/ image 52)  Degenerative changes of the visualized thoracic spine.  CT ABDOMEN and PELVIS FINDINGS  Liver is notable for a 1.7 x 1.1 cm hypoenhancing lesion in the lateral segment left hepatic lobe (series 95/MWUXL 20), of uncertain etiology, but likely present on unenhanced CT from 2013.  Spleen is within the upper limits of normal for size.  Pancreas and right adrenal gland are within normal limits. Mild nodular thickening of the left adrenal gland, without discrete mass.  Gallbladder is mildly distended, without associated inflammatory changes. No intrahepatic or extrahepatic ductal dilatation.  Status post a right nephrectomy. 2.1 cm lateral interpolar left renal cyst (series 11/ image 38). Additional small left renal cyst. No hydronephrosis.  No evidence of bowel obstruction. Colonic diverticulosis, particularly involving the sigmoid colon. Mild pericolonic stranding/fluid along the distal sigmoid colon (series 11/image 74), raising the possibility of mild sigmoid diverticulitis. No drainable fluid collection/abscess. No free air.  Associated patent fistulous track between sigmoid colon and vaginal cuff in this patient status post hysterectomy (series 11/ images 75-80).  No adnexal masses.  Bladder is mildly thick-walled but underdistended.   Atherosclerotic calcifications of the abdominal aorta and branch vessels.  No abdominopelvic ascites.  No suspicious abdominopelvic lymphadenopathy.  Mild degenerative changes of the lumbar spine.  Review of the MIP images confirms the above findings.  IMPRESSION: No evidence of pulmonary embolism.  Cardiomegaly with mild interstitial edema and small bilateral pleural effusions, right greater than left.  Patchy right lower lobe opacity, atelectasis versus pneumonia. Lingular and left lower lobe opacity, likely atelectasis.  Thoracic lymphadenopathy, possibly reactive, although indeterminate.  Possible mild sigmoid diverticulitis. No drainable fluid collection/abscess. No free air.  Associated patent fistulous track between the sigmoid colon and vaginal cuff.   Electronically Signed   By: Henderson Newcomer.D.  On: 06/06/2014 21:02   Ct Abdomen W Contrast  05/22/2014   CLINICAL DATA:  Abdominal pain. Right nephrectomy for renal cell carcinoma 10 years ago. Appendectomy.  EXAM: CT ABDOMEN WITH CONTRAST  TECHNIQUE: Multidetector CT imaging of the abdomen was performed using the standard protocol following bolus administration of intravenous contrast.  CONTRAST:  128mL OMNIPAQUE IOHEXOL 300 MG/ML  SOLN  COMPARISON:  05/01/2014  FINDINGS: Lower Chest: Bibasilar atelectasis. Mild cardiomegaly with small bilateral pleural effusions.  Abdomen: Normal liver. Splenic size upper normal with subcentimeter nonspecific peripheral hypo attenuating foci within. These are of indeterminate acuity, but appear new since the most recent contrast-enhanced study of 07/27/2012.  Normal stomach, pancreas. Gallbladder borderline distended, without pericholecystic edema or biliary ductal dilatation.  Normal right adrenal gland. Similar left adrenal thickening/nodularity.  Moderate left renal atrophy. Minimally complex lower pole left renal cyst (calcification dependently). Other left renal lesions which are consistent with cysts. Right  nephrectomy, without locally recurrent disease.  Advanced aortic and branch vessel atherosclerosis. No retroperitoneal or retrocrural adenopathy.  Extensive colonic diverticulosis. Normal terminal ileum. Normal abdominal small bowel without ascites.  Bones/Musculoskeletal:  No acute osseous abnormality.  IMPRESSION: 1. Multiple low-density subcapsular foci within the spleen. These are indeterminate, but appear new since 07/27/2012. Differential considerations include small volume splenic infarcts versus interval development of lymphangiomas. 2. No other explanation for abdominal pain. 3. Borderline gallbladder distention, without specific evidence of acute cholecystitis. 4. Right nephrectomy without locally recurrent or metastatic disease. 5. Small bilateral pleural effusions. 6. Advanced atherosclerosis.   Electronically Signed   By: Abigail Miyamoto M.D.   On: 05/22/2014 08:17   Ct Abdomen Pelvis W Contrast  06/06/2014   CLINICAL DATA:  Shortness of breath, evaluate for pneumonia or kidney.  EXAM: CT ANGIOGRAPHY CHEST  CT ABDOMEN AND PELVIS WITH CONTRAST  TECHNIQUE: Multidetector CT imaging of the chest was performed using the standard protocol during bolus administration of intravenous contrast. Multiplanar CT image reconstructions and MIPs were obtained to evaluate the vascular anatomy. Multidetector CT imaging of the abdomen and pelvis was performed using the standard protocol during bolus administration of intravenous contrast.  CONTRAST:  176mL OMNIPAQUE IOHEXOL 350 MG/ML SOLN  COMPARISON:  CT abdomen pelvis dated 05/28/2014.  FINDINGS: CTA CHEST FINDINGS  No evidence of pulmonary embolism.  Interlobular septal thickening, upper lobe predominant, suspicious for mild interstitial edema. Associated cardiomegaly with small bilateral pleural effusions, right greater than left.  Patchy right lower lobe opacity (series 4/ image 67), atelectasis versus pneumonia. Lingular and left lower lobe opacity, likely  atelectasis.  No suspicious pulmonary nodules. Underlying moderate centrilobular and paraseptal emphysematous changes. No pneumothorax.  Visualized right thyroid is mildly nodular.  Coronary atherosclerosis. Atherosclerotic calcifications of the aortic arch.  Thoracic lymphadenopathy, including:  --10 mm short axis right paratracheal node (series 4/ image 28)  --12 mm short axis prevascular node (series 4/ image 35)  --14 mm short axis right paratracheal node (series 4/ image 38)  --16 mm short axis left hilar node (series 4/image 50)  --15 mm short axis subcarinal node (series 4/ image 51)  --11 mm short axis right perihilar node (series 4/ image 52)  Degenerative changes of the visualized thoracic spine.  CT ABDOMEN and PELVIS FINDINGS  Liver is notable for a 1.7 x 1.1 cm hypoenhancing lesion in the lateral segment left hepatic lobe (series 92/EQAST 20), of uncertain etiology, but likely present on unenhanced CT from 2013.  Spleen is within the upper limits of normal for size.  Pancreas and right adrenal gland are within normal limits. Mild nodular thickening of the left adrenal gland, without discrete mass.  Gallbladder is mildly distended, without associated inflammatory changes. No intrahepatic or extrahepatic ductal dilatation.  Status post a right nephrectomy. 2.1 cm lateral interpolar left renal cyst (series 11/ image 38). Additional small left renal cyst. No hydronephrosis.  No evidence of bowel obstruction. Colonic diverticulosis, particularly involving the sigmoid colon. Mild pericolonic stranding/fluid along the distal sigmoid colon (series 11/image 74), raising the possibility of mild sigmoid diverticulitis. No drainable fluid collection/abscess. No free air.  Associated patent fistulous track between sigmoid colon and vaginal cuff in this patient status post hysterectomy (series 11/ images 75-80).  No adnexal masses.  Bladder is mildly thick-walled but underdistended.  Atherosclerotic calcifications of  the abdominal aorta and branch vessels.  No abdominopelvic ascites.  No suspicious abdominopelvic lymphadenopathy.  Mild degenerative changes of the lumbar spine.  Review of the MIP images confirms the above findings.  IMPRESSION: No evidence of pulmonary embolism.  Cardiomegaly with mild interstitial edema and small bilateral pleural effusions, right greater than left.  Patchy right lower lobe opacity, atelectasis versus pneumonia. Lingular and left lower lobe opacity, likely atelectasis.  Thoracic lymphadenopathy, possibly reactive, although indeterminate.  Possible mild sigmoid diverticulitis. No drainable fluid collection/abscess. No free air.  Associated patent fistulous track between the sigmoid colon and vaginal cuff.   Electronically Signed   By: Julian Hy M.D.   On: 06/06/2014 21:02   Dg Chest Portable 1 View  06/08/2014   CLINICAL DATA:  Central line placement.  EXAM: PORTABLE CHEST - 1 VIEW  COMPARISON:  CT chest 06/06/2014.  Chest x-ray 06/06/2014.  FINDINGS: Right IJ line noted with tip projected over superior vena cava. Mediastinum and hilar structures normal. Cardiomegaly with pulmonary vascular prominence and diffuse interstitial prominence is with congestive heart for pleural interstitial edema. Small left pleural effusion. No pneumothorax. Surgical clips noted in the neck.  IMPRESSION: 1. Right IJ catheter noted with tip projected over superior vena cava. 2. Congestive heart failure with pulmonary edema and small left pleural effusion.   Electronically Signed   By: Marcello Moores  Register   On: 06/08/2014 15:08   Dg Chest Portable 1 View  06/06/2014   CLINICAL DATA:  Abdominal pain and shortness of breath  EXAM: PORTABLE CHEST - 1 VIEW  COMPARISON:  05/27/2014  FINDINGS: Cardiac shadow is stable in appearance. Increasing infiltrate is noted in the medial right lung base as well as in the lateral left lung base. Diffuse interstitial changes remain with mild interstitial edema.  IMPRESSION:  Increasing bibasilar infiltrates.   Electronically Signed   By: Inez Catalina M.D.   On: 06/06/2014 16:03   Dg Chest Port 1 View  05/21/2014   CLINICAL DATA:  Difficulty breathing and cough  EXAM: PORTABLE CHEST - 1 VIEW  COMPARISON:  March 01, 2014  FINDINGS: There is cardiomegaly with pulmonary venous hypertension. There is generalized interstitial edema. There is a small area of infiltrate in the lateral right base. No adenopathy. There is atherosclerotic change in aorta.  IMPRESSION: Findings felt to represent chronic congestive heart failure. Superimposed infiltrate lateral right base.   Electronically Signed   By: Lowella Grip M.D.   On: 05/21/2014 21:25   Ir Shuntogram/ Fistulagram Right Mod Sed  05/23/2014   INDICATION: Decreased flows at dialysis.  EXAM: DIALYSIS AV SHUNTOGRAM/FISTULAGRAM  COMPARISON:  Right upper extremity native AV fistulagram -09/20/2012  MEDICATIONS: None.  CONTRAST:  50 mL OMNIPAQUE  IOHEXOL 300 MG/ML  SOLN  ANESTHESIA/SEDATION: None  FLUOROSCOPY TIME:  30 seconds.  COMPLICATIONS: None immediate  PROCEDURE: The skin overlying the right upper arm native av fistula was prepped with Betadine in a sterile fashion, and a sterile drape was applied covering the operative field. A diagnostic shunt study was performed via an 18 gauge angiocatheter introduced into venous outflow. Venous drainage was assessed to the level of the central veins in the chest. Proximal shunt was studied by reflux maneuver with temporary compression of venous outflow.  The angiocath was removed and hemostasis was achieved with manual compression. A dressing was placed. The patient tolerated the procedure well without immediate post procedural complication.  FINDINGS: The native right upper arm brachiocephalic AV fistula is widely patent through the level of the cephalic arch. There is a very minimal collateralization of flow from the hypertrophied cephalic vein to the deep venous system of the right upper arm  though these venous collaterals do not result in significant steal phenomena.  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  The central venous system of the right upper extremity is widely patent to the level of the right atrium.  Reflux shuntogram demonstrates wide patency of the arterial limb and anastomosis.  IMPRESSION: Normally functioning right upper arm brachiocephalic AV fistula without peripheral or central stenosis. No intervention performed.  ACCESS: This access remains amenable to future percutaneous interventions as clinically indicated.   Electronically Signed   By: Sandi Mariscal M.D.   On: 05/23/2014 16:54    Medications: Scheduled Meds: . sodium chloride   Intravenous Once  . albuterol  2.5 mg Nebulization Once  . amiodarone  200 mg Oral Daily  . antiseptic oral rinse  7 mL Mouth Rinse BID  . calcitRIOL  0.25 mcg Oral Q M,W,F  . cefTRIAXone (ROCEPHIN)  IV  1 g Intravenous Q24H  . darbepoetin (ARANESP) injection - DIALYSIS  120 mcg Intravenous Q Wed-HD  . insulin aspart  0-15 Units Subcutaneous TID WC  . metronidazole  500 mg Intravenous Q6H  . ondansetron (ZOFRAN) IV  4 mg Intravenous 4 times per day  . rOPINIRole  1 mg Oral BID  . sodium chloride  3 mL Intravenous Q12H  . vancomycin  125 mg Oral 4 times per day      LOS: 5 days   Abdullah Rizzi M.D. [facility] 06/11/2014, 11:26 AM Pager: [phone]  If 7PM-7AM, please contact night-coverage [email or web address] Password [email or web address]  **Disclaimer: This note was dictated with voice recognition software. Similar sounding words can inadvertently be transcribed and this note may contain transcription errors which may not have been corrected upon publication of note.**

## 2014-06-11 NOTE — Progress Notes (Signed)
Seen and agree  

## 2014-06-11 NOTE — Progress Notes (Signed)
[name] Progress Note   Subjective: up to 73kg today, 2.5 kg off with HD yesterday  Filed Vitals:   06/10/14 2107 06/11/14 0503 06/11/14 0524 06/11/14 0950  BP: 142/46  160/51 143/49  Pulse: 69  71   Temp: 97.9 F (36.6 C)  97.9 F (36.6 C)   TempSrc: Oral  Oral   Resp: 17  18   Height:      Weight:   73.2 kg (161 lb 6 oz)   SpO2: 94% 94% 95%    Exam: Alert, not in distress No jvd Chest clear bilat RRR no MRG Abd soft, NTND, +BS No LE edema RUA AVF +bruit, bruising  Neuro is nf, Ox 3  HD: MWF [place] 4h   72kg   2/2.25 [place]  400/A1.5  RUA AVF 15ga   Prof 2   Heparin none Calcitriol 0.25 ug po TIW, Aranesp 120 ug q week Pth 384, P 4.4, tsat 30, eKT/V 1.46  Assessment: 1 Rectovaginal fistula - IV abx, per surg 2 C dificile infection on po vanc 3 ESRD on HD 4 Vol excess / pulm edema- persistent by cxr 5 HPTH resume phoslo when eating, cont vit D 6 Anemia s/p prbc x 2, cont aranesp 120/wk 7 DM on oral meds 8 Afib on amio, IV MTP while npo 9 COPD on continuous O2 at home and here   Plan- pt is volume overloaded, will need to stop TNA until we get this under control.  Have d/w primary. Plan HD in AM with max UF.     Kelly Splinter MD  pager [phone]    cell [phone]  06/11/2014, 11:14 AM     Recent Labs Lab 06/09/14 0355 06/10/14 0905 06/11/14 0542  NA 133* 133* 132*  K 5.0 4.5 4.3  CL 96 97 97  CO2 24 27 26   GLUCOSE 134* 116* 122*  BUN 26* 22 23  CREATININE 4.88* 3.87* 3.26*  CALCIUM 8.1* 8.1* 8.2*  PHOS 5.2* 3.4 2.2*    Recent Labs Lab 06/08/14 0500 06/09/14 0355 06/10/14 0905  AST 14 14 10   ALT 9 8 6   ALKPHOS 87 85 80  BILITOT 0.4 <0.2* <0.2*  PROT 5.4* 5.4* 5.3*  ALBUMIN 2.1* 2.2* 2.2*    Recent Labs Lab 06/06/14 1620  06/08/14 0500 06/09/14 0355 06/10/14 0905  WBC 12.9*  < > 10.0 9.6 8.3  NEUTROABS 10.7*  --   --  6.8  --   HGB 8.7*  10.2*  < > 10.7* 11.0* 10.6*  HCT 30.6*  30.0*  < > 35.6* 37.4 35.9*  MCV  98.1  < > 95.2 95.9 96.2  PLT 210  < > 168 191 147*  < > = values in this interval not displayed. . sodium chloride   Intravenous Once  . albuterol  2.5 mg Nebulization Once  . amiodarone  200 mg Oral Daily  . antiseptic oral rinse  7 mL Mouth Rinse BID  . calcitRIOL  0.25 mcg Oral Q M,W,F  . cefTRIAXone (ROCEPHIN)  IV  1 g Intravenous Q24H  . darbepoetin (ARANESP) injection - DIALYSIS  120 mcg Intravenous Q Wed-HD  . insulin aspart  0-15 Units Subcutaneous TID WC  . metronidazole  500 mg Intravenous Q6H  . ondansetron (ZOFRAN) IV  4 mg Intravenous 4 times per day  . rOPINIRole  1 mg Oral BID  . sodium chloride  3 mL Intravenous Q12H  . vancomycin  125 mg Oral 4 times per day   .  sodium chloride 10 mL/hr at 06/09/14 0830   acetaminophen, feeding supplement (NEPRO CARB STEADY), ipratropium, levalbuterol, metoprolol, morphine injection, ondansetron (ZOFRAN) IV, ondansetron, sodium chloride

## 2014-06-11 NOTE — Progress Notes (Signed)
Patient ID: [name], female   DOB: 29-Apr-1941, 73 y.o.   MRN: [id number]     [facility]      [address] [place], [place] [address]    Phone: [phone] FAX: [phone]     Subjective: No increase in drainage. No longer using pad for vaginal D/C.  Afebrile.  VSS.    Objective:  Vital signs:  Filed Vitals:   06/10/14 1842 06/10/14 2107 06/11/14 0503 06/11/14 0524  BP: 140/56 142/46  160/51  Pulse: 70 69  71  Temp: 98 F (36.7 C) 97.9 F (36.6 C)  97.9 F (36.6 C)  TempSrc: Oral Oral  Oral  Resp: $Remo'18 17  18  'xDPxP$ Height:      Weight:    161 lb 6 oz (73.2 kg)  SpO2: 94% 94% 94% 95%    Last BM Date: 06/09/14  Intake/Output   Yesterday:  09/12 0701 - 09/13 0700 In: 683 [P.O.:680; I.V.:3] Out: 2500  This shift:    I/O last 3 completed shifts: In: 2795 [P.O.:680; I.V.:3] Out: 2500 [Other:2500]    Physical Exam:  General: Pt awake/alert/oriented x4 in no acute distress  Abdomen: Soft. Nondistended. Non tender. No evidence of peritonitis. No incarcerated hernias.  Problem List:   Active Problems:   CKD (chronic kidney disease) stage V requiring chronic dialysis   DM2 (diabetes mellitus, type 2)   C. difficile colitis   Chronic respiratory failure with hypoxia   HTN (hypertension)   Pulmonary HTN   OSA (obstructive sleep apnea)   COPD (chronic obstructive pulmonary disease)   Acute respiratory failure with hypoxia   HCAP (healthcare-associated pneumonia)   Abdominal pain, acute   Colovesical fistula    Results:   Labs: Results for orders placed during the hospital encounter of 06/06/14 (from the past 48 hour(s))  GLUCOSE, CAPILLARY     Status: Abnormal   Collection Time    06/09/14  1:36 PM      Result Value Ref Range   Glucose-Capillary 112 (*) 70 - 99 mg/dL  GLUCOSE, CAPILLARY     Status: Abnormal   Collection Time    06/09/14  4:31 PM      Result Value Ref Range   Glucose-Capillary 129 (*) 70 -  99 mg/dL  GLUCOSE, CAPILLARY     Status: Abnormal   Collection Time    06/09/14  8:44 PM      Result Value Ref Range   Glucose-Capillary 104 (*) 70 - 99 mg/dL   Comment 1 Notify RN    GLUCOSE, CAPILLARY     Status: Abnormal   Collection Time    06/09/14 11:19 PM      Result Value Ref Range   Glucose-Capillary 112 (*) 70 - 99 mg/dL   Comment 1 Notify RN    GLUCOSE, CAPILLARY     Status: Abnormal   Collection Time    06/10/14  3:48 AM      Result Value Ref Range   Glucose-Capillary 139 (*) 70 - 99 mg/dL   Comment 1 Notify RN    GLUCOSE, CAPILLARY     Status: Abnormal   Collection Time    06/10/14  8:15 AM      Result Value Ref Range   Glucose-Capillary 129 (*) 70 - 99 mg/dL  COMPREHENSIVE METABOLIC PANEL     Status: Abnormal   Collection Time    06/10/14  9:05 AM      Result Value Ref  Range   Sodium 133 (*) 137 - 147 mEq/L   Potassium 4.5  3.7 - 5.3 mEq/L   Chloride 97  96 - 112 mEq/L   CO2 27  19 - 32 mEq/L   Glucose, Bld 116 (*) 70 - 99 mg/dL   BUN 22  6 - 23 mg/dL   Creatinine, Ser 3.87 (*) 0.50 - 1.10 mg/dL   Calcium 8.1 (*) 8.4 - 10.5 mg/dL   Total Protein 5.3 (*) 6.0 - 8.3 g/dL   Albumin 2.2 (*) 3.5 - 5.2 g/dL   AST 10  0 - 37 U/L   ALT 6  0 - 35 U/L   Alkaline Phosphatase 80  39 - 117 U/L   Total Bilirubin <0.2 (*) 0.3 - 1.2 mg/dL   GFR calc non Af Amer 11 (*) >90 mL/min   GFR calc Af Amer 12 (*) >90 mL/min   Comment: (NOTE)     The eGFR has been calculated using the CKD EPI equation.     This calculation has not been validated in all clinical situations.     eGFR's persistently <90 mL/min signify possible Chronic Kidney     Disease.   Anion gap 9  5 - 15  MAGNESIUM     Status: None   Collection Time    06/10/14  9:05 AM      Result Value Ref Range   Magnesium 1.6  1.5 - 2.5 mg/dL  PHOSPHORUS     Status: None   Collection Time    06/10/14  9:05 AM      Result Value Ref Range   Phosphorus 3.4  2.3 - 4.6 mg/dL  CBC     Status: Abnormal   Collection Time     06/10/14  9:05 AM      Result Value Ref Range   WBC 8.3  4.0 - 10.5 K/uL   RBC 3.73 (*) 3.87 - 5.11 MIL/uL   Hemoglobin 10.6 (*) 12.0 - 15.0 g/dL   HCT 35.9 (*) 36.0 - 46.0 %   MCV 96.2  78.0 - 100.0 fL   MCH 28.4  26.0 - 34.0 pg   MCHC 29.5 (*) 30.0 - 36.0 g/dL   RDW 17.8 (*) 11.5 - 15.5 %   Platelets 147 (*) 150 - 400 K/uL  GLUCOSE, CAPILLARY     Status: Abnormal   Collection Time    06/10/14 11:44 AM      Result Value Ref Range   Glucose-Capillary 111 (*) 70 - 99 mg/dL  GLUCOSE, CAPILLARY     Status: Abnormal   Collection Time    06/10/14  6:39 PM      Result Value Ref Range   Glucose-Capillary 122 (*) 70 - 99 mg/dL  GLUCOSE, CAPILLARY     Status: Abnormal   Collection Time    06/10/14  7:44 PM      Result Value Ref Range   Glucose-Capillary 131 (*) 70 - 99 mg/dL   Comment 1 Notify RN    GLUCOSE, CAPILLARY     Status: Abnormal   Collection Time    06/10/14 11:31 PM      Result Value Ref Range   Glucose-Capillary 118 (*) 70 - 99 mg/dL   Comment 1 Notify RN    GLUCOSE, CAPILLARY     Status: Abnormal   Collection Time    06/11/14  3:33 AM      Result Value Ref Range   Glucose-Capillary 125 (*) 70 - 99 mg/dL  Comment 1 Notify RN    BASIC METABOLIC PANEL     Status: Abnormal   Collection Time    06/11/14  5:42 AM      Result Value Ref Range   Sodium 132 (*) 137 - 147 mEq/L   Potassium 4.3  3.7 - 5.3 mEq/L   Chloride 97  96 - 112 mEq/L   CO2 26  19 - 32 mEq/L   Glucose, Bld 122 (*) 70 - 99 mg/dL   BUN 23  6 - 23 mg/dL   Creatinine, Ser 7.53 (*) 0.50 - 1.10 mg/dL   Calcium 8.2 (*) 8.4 - 10.5 mg/dL   GFR calc non Af Amer 13 (*) >90 mL/min   GFR calc Af Amer 15 (*) >90 mL/min   Comment: (NOTE)     The eGFR has been calculated using the CKD EPI equation.     This calculation has not been validated in all clinical situations.     eGFR's persistently <90 mL/min signify possible Chronic Kidney     Disease.   Anion gap 9  5 - 15  PHOSPHORUS     Status: Abnormal    Collection Time    06/11/14  5:42 AM      Result Value Ref Range   Phosphorus 2.2 (*) 2.3 - 4.6 mg/dL  MAGNESIUM     Status: None   Collection Time    06/11/14  5:42 AM      Result Value Ref Range   Magnesium 1.5  1.5 - 2.5 mg/dL  GLUCOSE, CAPILLARY     Status: Abnormal   Collection Time    06/11/14  8:12 AM      Result Value Ref Range   Glucose-Capillary 121 (*) 70 - 99 mg/dL    Imaging / Studies: Dg Chest Port 1 View  06/10/2014   CLINICAL DATA:  Pulmonary edema.  EXAM: PORTABLE CHEST - 1 VIEW  COMPARISON:  06/08/2014.  FINDINGS: Cardiopericardial silhouette upper limits of normal for projection. Aortic arch atherosclerosis. Unchanged RIGHT IJ central line. Interstitial pulmonary edema appears similar to the prior exam. Alveolar edema is present at the LEFT base, with small LEFT pleural effusion. RIGHT costophrenic angle excluded from view.  IMPRESSION: Unchanged moderate CHF.   Electronically Signed   By: Andreas Newport M.D.   On: 06/10/2014 21:21    Medications / Allergies:  Scheduled Meds: . sodium chloride   Intravenous Once  . albuterol  2.5 mg Nebulization Once  . amiodarone  200 mg Oral Daily  . antiseptic oral rinse  7 mL Mouth Rinse BID  . calcitRIOL  0.25 mcg Oral Q M,W,F  . cefTRIAXone (ROCEPHIN)  IV  1 g Intravenous Q24H  . darbepoetin (ARANESP) injection - DIALYSIS  120 mcg Intravenous Q Wed-HD  . heparin subcutaneous  5,000 Units Subcutaneous 3 times per day  . insulin aspart  0-15 Units Subcutaneous TID WC  . metronidazole  500 mg Intravenous Q6H  . ondansetron (ZOFRAN) IV  4 mg Intravenous 4 times per day  . rOPINIRole  1 mg Oral BID  . sodium chloride  3 mL Intravenous Q12H  . vancomycin  125 mg Oral 4 times per day   Continuous Infusions: . sodium chloride 10 mL/hr at 06/09/14 0830  . TPN (CLINIMIX) Adult without lytes 75 mL/hr at 06/10/14 1738   And  . fat emulsion 250 mL (06/10/14 1737)   PRN Meds:.sodium chloride, sodium chloride, sodium chloride,  sodium chloride, acetaminophen, feeding supplement (NEPRO CARB STEADY),  heparin, ipratropium, levalbuterol, lidocaine (PF), lidocaine-prilocaine, metoprolol, morphine injection, ondansetron (ZOFRAN) IV, ondansetron, pentafluoroprop-tetrafluoroeth, sodium chloride  Antibiotics: Anti-infectives   Start     Dose/Rate Route Frequency Ordered Stop   06/08/14 1200  fluconazole (DIFLUCAN) tablet 100 mg  Status:  Discontinued     100 mg Oral Every 48 hours 06/06/14 2117 06/07/14 0729   06/07/14 1400  cefTRIAXone (ROCEPHIN) 1 g in dextrose 5 % 50 mL IVPB     1 g 100 mL/hr over 30 Minutes Intravenous Every 24 hours 06/07/14 1208     06/07/14 1200  metroNIDAZOLE (FLAGYL) IVPB 500 mg     500 mg 100 mL/hr over 60 Minutes Intravenous Every 6 hours 06/07/14 1143     06/07/14 1000  fluconazole (DIFLUCAN) tablet 50 mg  Status:  Discontinued     50 mg Oral Every 48 hours 06/06/14 2121 06/07/14 0729   06/07/14 0000  vancomycin (VANCOCIN) 50 mg/mL oral solution 125 mg     125 mg Oral 4 times per day 06/06/14 2122     06/06/14 2200  vancomycin (VANCOCIN) 125 MG capsule 125 mg  Status:  Discontinued     125 mg Oral 4 times daily 06/06/14 2117 06/06/14 2121   06/06/14 1645  vancomycin (VANCOCIN) 1,250 mg in sodium chloride 0.9 % 250 mL IVPB  Status:  Discontinued     1,250 mg 166.7 mL/hr over 90 Minutes Intravenous  Once 06/06/14 1641 06/06/14 1735   06/06/14 1645  piperacillin-tazobactam (ZOSYN) IVPB 2.25 g  Status:  Discontinued     2.25 g 100 mL/hr over 30 Minutes Intravenous 3 times per day 06/06/14 1641 06/06/14 1719     Assessment/Plan  Anemia  ESRD/HD  CAD/Atrial fibrillation  COPD on home 02  HCAP  DM  Hx C diff colitis--negative 9/8   Mild sigmoid diverticulitis  Colovaginal fistula  -no increase in drainage, on clears, advance to fulls -she will likely need surgery next week  -continue TPN  -continue atbx  -further management per primary team  -will follow   Erby Pian,  [facility] Surgery Pager [phone](7A-4:30P) For consults and floor pages call [phone](7A-4:30P)  06/11/2014 9:27 AM

## 2014-06-11 NOTE — Progress Notes (Addendum)
PARENTERAL NUTRITION CONSULT NOTE - FOLLOW UP  Pharmacy Consult for TPN Indication: Colovesical fistula  Allergies  Allergen Reactions  . Lipitor [Atorvastatin] Other (See Comments)    Causes weakness and drowsiness  . Morphine And Related Nausea And Vomiting  . Nsaids Other (See Comments)    Unknown reported by previous hospital    Patient Measurements: Height: 5\' 4"  (162.6 cm) Weight: 161 lb 6 oz (73.2 kg) (one sgeet one blanket on bed) IBW/kg (Calculated) : 54.7 Adjusted Body Weight: 61kg   Vital Signs: Temp: 97.9 F (36.6 C) (09/13 0524) Temp src: Oral (09/13 0524) BP: 160/51 mmHg (09/13 0524) Pulse Rate: 71 (09/13 0524) Intake/Output from previous day: 09/12 0701 - 09/13 0700 In: 683 [P.O.:680; I.V.:3] Out: 2500  Intake/Output from this shift:    Labs:  Recent Labs  06/08/14 1030 06/09/14 0355 06/10/14 0905  WBC  --  9.6 8.3  HGB  --  11.0* 10.6*  HCT  --  37.4 35.9*  PLT  --  191 147*  APTT 33  --   --   INR 1.17  --   --      Recent Labs  06/09/14 0355 06/10/14 0905 06/11/14 0542  NA 133* 133* 132*  K 5.0 4.5 4.3  CL 96 97 97  CO2 24 27 26   GLUCOSE 134* 116* 122*  BUN 26* 22 23  CREATININE 4.88* 3.87* 3.26*  CALCIUM 8.1* 8.1* 8.2*  MG 1.8 1.6 1.5  PHOS 5.2* 3.4 2.2*  PROT 5.4* 5.3*  --   ALBUMIN 2.2* 2.2*  --   AST 14 10  --   ALT 8 6  --   ALKPHOS 85 80  --   BILITOT <0.2* <0.2*  --   PREALBUMIN 8.1*  --   --   TRIG 88  --   --    Estimated Creatinine Clearance: 15.3 ml/min (by C-G formula based on Cr of 3.26).    Recent Labs  06/10/14 2331 06/11/14 0333 06/11/14 0812  GLUCAP 118* 125* 121*    Insulin Requirements in the past 24 hours:  2 units SSI required  Assessment: 56 YOF who presented to the ED with colovesical fistula which was noted on CT. General surgery was consulted and they recommended bowel rest and antibiotics. Pt has h/o ESRD on dialysis   GI: Acute abdominal pain with colovesical fistula. Gen surg  suspects patient will need resection with temporary colostomy next week.  She is taking in some clear liquids and TPN is being used to optimize nutrition prior to surgery. Diet advanced to full liquids.  Endo: h/o DM, CBGs controlled, on moderate scale SSI  Lytes: K 4.3, Na 132, Phos down to 2.2, Mag 1.5 - on electrolyte-free TPN Renal: ESRD on HD MWF per nephrology, Wt stable at 161 lbs   Pulm: COPD, 1 L [place]  Cards: HTN, Afib, HLD, BP 160/51, HR 71 Hepatobil: LFTs wnl  Neuro: h/o seizures;  ID: On D#5 Rocephin, flagyl and oral vancomycin, WBC wnl, pt is afebrile  Best Practices: Heparin [place]  TPN Access:  R IJ CVC 06/08/14 TPN day#: 4 (started 9/10)  Current Nutrition:  Clear liquids Clinimix 5/15 at 75 ml/hr + 20% lipid emulsion at 10 ml/hr. This provides 90g (100% goal) protein and 1758 kCal (88% goal) per day   Nutrition Goals established by RD: Kcal: 2000-2100  Protein: 85-95g   Plan:  -Will change Clinimix 5/15 rate slightly to 70 ml/hr and 20% lipid emulsion at 10 ml/hr per  RD recommendations.  This will provide 84g (99% goal) protein and 1672 kCal (84% goal) per day. Will not advance TPN any further  -Continue with electrolyte-free TPN.  Check Bmet, Magnesium, Phosphorus with AM labs. -Monitor daily weights  -Daily MVI and TE  -Monitor fluid balance, especially since she is HD-dependent.   Albertina Parr, PharmD.  Clinical Pharmacist Pager [phone]     Addendum: TPN placed on hold due to volume overload. F/u plans for TPN resumption tomorrow.   Albertina Parr, PharmD.  Clinical Pharmacist Pager [phone]

## 2014-06-12 LAB — CBC
HCT: 37.8 % (ref 36.0–46.0)
HEMOGLOBIN: 11.2 g/dL — AB (ref 12.0–15.0)
MCH: 28.6 pg (ref 26.0–34.0)
MCHC: 29.6 g/dL — ABNORMAL LOW (ref 30.0–36.0)
MCV: 96.4 fL (ref 78.0–100.0)
PLATELETS: 167 10*3/uL (ref 150–400)
RBC: 3.92 MIL/uL (ref 3.87–5.11)
RDW: 17.4 % — ABNORMAL HIGH (ref 11.5–15.5)
WBC: 8.7 10*3/uL (ref 4.0–10.5)

## 2014-06-12 LAB — COMPREHENSIVE METABOLIC PANEL
ALBUMIN: 2.3 g/dL — AB (ref 3.5–5.2)
ALK PHOS: 85 U/L (ref 39–117)
ALT: 6 U/L (ref 0–35)
AST: 13 U/L (ref 0–37)
Anion gap: 14 (ref 5–15)
BUN: 35 mg/dL — ABNORMAL HIGH (ref 6–23)
CHLORIDE: 98 meq/L (ref 96–112)
CO2: 21 mEq/L (ref 19–32)
Calcium: 9 mg/dL (ref 8.4–10.5)
Creatinine, Ser: 4.77 mg/dL — ABNORMAL HIGH (ref 0.50–1.10)
GFR calc Af Amer: 10 mL/min — ABNORMAL LOW (ref >90)
GFR calc non Af Amer: 8 mL/min — ABNORMAL LOW (ref >90)
Glucose, Bld: 85 mg/dL (ref 70–99)
POTASSIUM: 5.3 meq/L (ref 3.7–5.3)
SODIUM: 133 meq/L — AB (ref 137–147)
TOTAL PROTEIN: 5.3 g/dL — AB (ref 6.0–8.3)
Total Bilirubin: <0.2 mg/dL — ABNORMAL LOW (ref 0.3–1.2)

## 2014-06-12 LAB — DIFFERENTIAL
Basophils Absolute: 0.1 10*3/uL (ref 0.0–0.1)
Basophils Relative: 1 % (ref 0–1)
EOS PCT: 11 % — AB (ref 0–5)
Eosinophils Absolute: 0.9 10*3/uL — ABNORMAL HIGH (ref 0.0–0.7)
LYMPHS ABS: 1.7 10*3/uL (ref 0.7–4.0)
LYMPHS PCT: 19 % (ref 12–46)
MONOS PCT: 6 % (ref 3–12)
Monocytes Absolute: 0.5 10*3/uL (ref 0.1–1.0)
Neutro Abs: 5.5 10*3/uL (ref 1.7–7.7)
Neutrophils Relative %: 63 % (ref 43–77)

## 2014-06-12 LAB — GLUCOSE, CAPILLARY
Glucose-Capillary: 80 mg/dL (ref 70–99)
Glucose-Capillary: 83 mg/dL (ref 70–99)
Glucose-Capillary: 81 mg/dL (ref 70–99)
Glucose-Capillary: 140 mg/dL — ABNORMAL HIGH (ref 70–99)
Glucose-Capillary: 101 mg/dL — ABNORMAL HIGH (ref 70–99)
Glucose-Capillary: 138 mg/dL — ABNORMAL HIGH (ref 70–99)

## 2014-06-12 LAB — MAGNESIUM: Magnesium: 1.6 mg/dL (ref 1.5–2.5)

## 2014-06-12 LAB — PHOSPHORUS: Phosphorus: 2.9 mg/dL (ref 2.3–4.6)

## 2014-06-12 LAB — TRIGLYCERIDES: Triglycerides: 182 mg/dL — ABNORMAL HIGH (ref <150)

## 2014-06-12 LAB — PREALBUMIN: PREALBUMIN: 16.5 mg/dL — AB (ref 17.0–34.0)

## 2014-06-12 MED ORDER — SODIUM CHLORIDE 0.9 % IV SOLN: 100.0000 mL | INTRAVENOUS | Status: DC | PRN

## 2014-06-12 MED ORDER — HEPARIN SODIUM (PORCINE) 1000 UNIT/ML DIALYSIS: 1000.0000 [IU] | INTRAMUSCULAR | Status: DC | PRN

## 2014-06-12 MED ORDER — INSULIN ASPART 100 UNIT/ML ~~LOC~~ SOLN: 0.0000 [IU] | Freq: Three times a day (TID) | SUBCUTANEOUS | Status: DC

## 2014-06-12 MED ORDER — NEPRO/CARBSTEADY PO LIQD
237.0000 mL | ORAL | Status: DC | PRN
  Filled 2014-06-12: qty 237

## 2014-06-12 MED ORDER — LIDOCAINE-PRILOCAINE 2.5-2.5 % EX CREA: 1.0000 "application " | TOPICAL_CREAM | CUTANEOUS | Status: DC | PRN

## 2014-06-12 MED ORDER — PENTAFLUOROPROP-TETRAFLUOROETH EX AERO: 1.0000 "application " | INHALATION_SPRAY | CUTANEOUS | Status: DC | PRN

## 2014-06-12 MED ORDER — ALTEPLASE 2 MG IJ SOLR
2.0000 mg | Freq: Once | INTRAMUSCULAR | Status: DC | PRN
  Filled 2014-06-12: qty 2

## 2014-06-12 MED ORDER — SODIUM CHLORIDE 0.9 % IV SOLN
100.0000 mL | INTRAVENOUS | Status: DC | PRN
Start: 2014-06-12 — End: 2014-06-12

## 2014-06-12 MED ORDER — LIDOCAINE HCL (PF) 1 % IJ SOLN: 5.0000 mL | INTRAMUSCULAR | Status: DC | PRN

## 2014-06-12 MED ORDER — INSULIN ASPART 100 UNIT/ML ~~LOC~~ SOLN: 0.0000 [IU] | Freq: Every day | SUBCUTANEOUS | Status: DC

## 2014-06-12 NOTE — Progress Notes (Signed)
Patient ID: [name]  female  JIR:[id number]    DOB: 1940/11/21    DOA: 06/06/2014  PCP: Gilford Rile, MD  Brief narrative:  73 year old female patient with past history of afib not on chronic anticoagulation, COPD, diabetes, seizure disorder, chronic kidney disease on dialysis. She presented to the emergency department because of ongoing abdominal pain and increasing shortness of breath. Patient had recently been hospitalized and subsequently discharged on 8/6 for acute diverticulitis noted on CT scan. She was discharged on Cipro and Flagyl. She was readmitted on 8/24 were shortness of breath with subsequent diagnosed with C. difficile colitis and pneumonia. She was discharged on 8/29 on cefuroxime and oral vancomycin. She returned to the the ER on the same date. She was observed in the ER for 6 hours and was stable and had no indications for readmission so she was discharged from the ER.  She returns to the ER on 9/8 after noticing urine next is stool coming both from her vagina and rectum which was a new finding. She also reported increasing and worsening abdominal cramping with diarrhea. Also noticed increased shortness of breath for the past 24 hours. She reported an episode of diffuse anterior chest pain duration 30 minutes 24 hours prior to presentation but this has not recurred. She is on chronic oxygen at home at 3 L per minute. Upon their arrival her saturations were initially 67% on 3 L of oxygen. She was placed on 100% nonrebreather mask and saturations improved to 100%. Upon the admitting physician's exam the patient was stable on 4 L nasal cannula oxygen. In the ER her white count was 13,300 with a normal platelet count . She was afebrile.  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 Gen. surgery has been consulted. They anticipate likely surgical intervention next week. 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 difficile colitis - General surgery following, on IV Flagyl and Rocephin  - d/w Dr Donne Hazel in detail, rec'd to advance diet, will need fistula repair but will do electively, f/u in office - TNA was placed on hold d/t vol overload, will wean off today. Diet advanced to solids.  Active problems: Recent C. difficile colitis  -Continue oral vancomycin, IV flagyl -most recent C. difficile PCR on 9/8 was negative   Acute respiratory failure with hypoxia/doubt HCAP. CT findings likely atelectasis versus fluid  Avoid adding abx given recent C. difficile colitis  Anemia with symptoms  -post 2 units pRBCs 9/8  CKD (chronic kidney disease) stage V requiring chronic dialysis  -Per nephrology-dialysis M/W/F  - d/w Dr Jonnie Finner, will place TNA on hold, volume overloaded  DM2 (diabetes mellitus, type 2)  -CBGs well controlled on SSI  HTN  Not on meds as outpt. Change metoprolol to prn   Paroxysmal atrial fibrillation  -Continue oral amiodarone-cont beta blocker IV for hypertensive control as well   Chronic respiratory failure with hypoxia due to:  A) Pulmonary HTN  B) OSA (obstructive sleep apnea)  C) COPD (chronic obstructive pulmonary disease)  -All compensated at this point-on 3 L nasal cannula at home   DVT Prophylaxis:  Code Status:  Family Communication:  Disposition: DC in AM, diet advanced to solids  today  Consultants:  General surgery  Nephrology  PCCM for line placement  Procedures:  Central line on 9/10  Antibiotics: Oral vancomycin 8/23 >>>  Flagyl  rocephin   Subjective: Patient seen and examined in HD unit, no significant complaints, excited about getting solids and go home  Objective: Weight  change: -1.361 kg (-3 lb)  Intake/Output Summary (Last 24 hours) at 06/12/14 1143 Last data filed at 06/12/14 0900  Gross per 24 hour  Intake   1100 ml  Output      0 ml  Net   1100 ml   Blood pressure 140/60, pulse 73, temperature 97.8 F (36.6 C), temperature source Oral, resp. rate 18, height 5\' 4"  (1.626 m), weight 73.5 kg (162 lb 0.6 oz), SpO2 97.00%.  Physical Exam: General: Alert and awake, oriented x3, NAD CVS: S1-S2 clear, no murmur rubs or gallops Chest: Decreased breath sound at the bases  Abdomen: soft nontender, nondistended, normal bowel sounds  Extremities: no cyanosis, clubbing or edema noted bilaterally   Lab Results: Basic Metabolic Panel:  Recent Labs Lab 06/11/14 0542 06/12/14 0500  NA 132* 133*  K 4.3 5.3  CL 97 98  CO2 26 21  GLUCOSE 122* 85  BUN 23 35*  CREATININE 3.26* 4.77*  CALCIUM 8.2* 9.0  MG 1.5 1.6  PHOS 2.2* 2.9   Liver Function Tests:  Recent Labs Lab 06/10/14 0905 06/12/14 0500  AST 10 13  ALT 6 6  ALKPHOS 80 85  BILITOT <0.2* <0.2*  PROT 5.3* 5.3*  ALBUMIN 2.2* 2.3*   No results found for this basename: LIPASE, AMYLASE,  in the last 168 hours No results found for this basename: AMMONIA,  in the last 168 hours CBC:  Recent Labs Lab 06/10/14 0905 06/12/14 0500  WBC 8.3 8.7  NEUTROABS  --  5.5  HGB 10.6* 11.2*  HCT 35.9* 37.8  MCV 96.2 96.4  PLT 147* 167   Cardiac Enzymes:  Recent Labs Lab 06/06/14 1713 06/07/14 0013  TROPONINI <0.30 <0.30   BNP: No components found with this basename: POCBNP,  CBG:  Recent Labs Lab 06/11/14 1608 06/11/14 1929 06/11/14 2333 06/12/14 0345 06/12/14 0734  GLUCAP 85 101* 77 80 83     Micro Results: Recent Results (from the past 240 hour(s))  MRSA PCR SCREENING     Status: None   Collection Time    06/06/14  9:16 PM      Result Value Ref Range Status   MRSA by PCR NEGATIVE  NEGATIVE Final   Comment:            The GeneXpert MRSA Assay (FDA     approved for  NASAL specimens     only), is one component of a     comprehensive MRSA colonization     surveillance program. It is not     intended to diagnose MRSA     infection nor to guide or     monitor treatment for     MRSA infections.  CLOSTRIDIUM DIFFICILE BY PCR     Status: None   Collection Time    06/06/14 10:44 PM      Result Value Ref Range Status   C difficile by pcr NEGATIVE  NEGATIVE Final    Studies/Results: Dg Chest 2 View  05/27/2014   CLINICAL DATA:  Multiple emesis, diarrhea, generalized weakness and fatigue today. Hemodialysis yesterday. Recent hospital discharge for pneumonia.  EXAM: CHEST  2 VIEW  COMPARISON:  05/21/2014  FINDINGS: Cardiac  enlargement with pulmonary vascular congestion and mild perihilar edema. Findings are improving since previous study. No pleural effusions. No pneumothorax. No focal consolidation. Calcification of the aorta.  IMPRESSION: Improving congestive changes since previous study.   Electronically Signed   By: Lucienne Capers M.D.   On: 05/27/2014 02:42   Ct Angio Chest Pe W/cm &/or Wo Cm  06/06/2014   CLINICAL DATA:  Shortness of breath, evaluate for pneumonia or kidney.  EXAM: CT ANGIOGRAPHY CHEST  CT ABDOMEN AND PELVIS WITH CONTRAST  TECHNIQUE: Multidetector CT imaging of the chest was performed using the standard protocol during bolus administration of intravenous contrast. Multiplanar CT image reconstructions and MIPs were obtained to evaluate the vascular anatomy. Multidetector CT imaging of the abdomen and pelvis was performed using the standard protocol during bolus administration of intravenous contrast.  CONTRAST:  126mL OMNIPAQUE IOHEXOL 350 MG/ML SOLN  COMPARISON:  CT abdomen pelvis dated 05/28/2014.  FINDINGS: CTA CHEST FINDINGS  No evidence of pulmonary embolism.  Interlobular septal thickening, upper lobe predominant, suspicious for mild interstitial edema. Associated cardiomegaly with small bilateral pleural effusions, right greater than left.   Patchy right lower lobe opacity (series 4/ image 67), atelectasis versus pneumonia. Lingular and left lower lobe opacity, likely atelectasis.  No suspicious pulmonary nodules. Underlying moderate centrilobular and paraseptal emphysematous changes. No pneumothorax.  Visualized right thyroid is mildly nodular.  Coronary atherosclerosis. Atherosclerotic calcifications of the aortic arch.  Thoracic lymphadenopathy, including:  --10 mm short axis right paratracheal node (series 4/ image 28)  --12 mm short axis prevascular node (series 4/ image 35)  --14 mm short axis right paratracheal node (series 4/ image 38)  --16 mm short axis left hilar node (series 4/image 50)  --15 mm short axis subcarinal node (series 4/ image 51)  --11 mm short axis right perihilar node (series 4/ image 52)  Degenerative changes of the visualized thoracic spine.  CT ABDOMEN and PELVIS FINDINGS  Liver is notable for a 1.7 x 1.1 cm hypoenhancing lesion in the lateral segment left hepatic lobe (series 09/QZRAQ 20), of uncertain etiology, but likely present on unenhanced CT from 2013.  Spleen is within the upper limits of normal for size.  Pancreas and right adrenal gland are within normal limits. Mild nodular thickening of the left adrenal gland, without discrete mass.  Gallbladder is mildly distended, without associated inflammatory changes. No intrahepatic or extrahepatic ductal dilatation.  Status post a right nephrectomy. 2.1 cm lateral interpolar left renal cyst (series 11/ image 38). Additional small left renal cyst. No hydronephrosis.  No evidence of bowel obstruction. Colonic diverticulosis, particularly involving the sigmoid colon. Mild pericolonic stranding/fluid along the distal sigmoid colon (series 11/image 74), raising the possibility of mild sigmoid diverticulitis. No drainable fluid collection/abscess. No free air.  Associated patent fistulous track between sigmoid colon and vaginal cuff in this patient status post hysterectomy  (series 11/ images 75-80).  No adnexal masses.  Bladder is mildly thick-walled but underdistended.  Atherosclerotic calcifications of the abdominal aorta and branch vessels.  No abdominopelvic ascites.  No suspicious abdominopelvic lymphadenopathy.  Mild degenerative changes of the lumbar spine.  Review of the MIP images confirms the above findings.  IMPRESSION: No evidence of pulmonary embolism.  Cardiomegaly with mild interstitial edema and small bilateral pleural effusions, right greater than left.  Patchy right lower lobe opacity, atelectasis versus pneumonia. Lingular and left lower lobe opacity, likely atelectasis.  Thoracic lymphadenopathy, possibly reactive, although indeterminate.  Possible mild sigmoid diverticulitis. No drainable fluid collection/abscess. No free  air.  Associated patent fistulous track between the sigmoid colon and vaginal cuff.   Electronically Signed   By: Julian Hy M.D.   On: 06/06/2014 21:02   Ct Abdomen W Contrast  05/22/2014   CLINICAL DATA:  Abdominal pain. Right nephrectomy for renal cell carcinoma 10 years ago. Appendectomy.  EXAM: CT ABDOMEN WITH CONTRAST  TECHNIQUE: Multidetector CT imaging of the abdomen was performed using the standard protocol following bolus administration of intravenous contrast.  CONTRAST:  122mL OMNIPAQUE IOHEXOL 300 MG/ML  SOLN  COMPARISON:  05/01/2014  FINDINGS: Lower Chest: Bibasilar atelectasis. Mild cardiomegaly with small bilateral pleural effusions.  Abdomen: Normal liver. Splenic size upper normal with subcentimeter nonspecific peripheral hypo attenuating foci within. These are of indeterminate acuity, but appear new since the most recent contrast-enhanced study of 07/27/2012.  Normal stomach, pancreas. Gallbladder borderline distended, without pericholecystic edema or biliary ductal dilatation.  Normal right adrenal gland. Similar left adrenal thickening/nodularity.  Moderate left renal atrophy. Minimally complex lower pole left renal  cyst (calcification dependently). Other left renal lesions which are consistent with cysts. Right nephrectomy, without locally recurrent disease.  Advanced aortic and branch vessel atherosclerosis. No retroperitoneal or retrocrural adenopathy.  Extensive colonic diverticulosis. Normal terminal ileum. Normal abdominal small bowel without ascites.  Bones/Musculoskeletal:  No acute osseous abnormality.  IMPRESSION: 1. Multiple low-density subcapsular foci within the spleen. These are indeterminate, but appear new since 07/27/2012. Differential considerations include small volume splenic infarcts versus interval development of lymphangiomas. 2. No other explanation for abdominal pain. 3. Borderline gallbladder distention, without specific evidence of acute cholecystitis. 4. Right nephrectomy without locally recurrent or metastatic disease. 5. Small bilateral pleural effusions. 6. Advanced atherosclerosis.   Electronically Signed   By: Abigail Miyamoto M.D.   On: 05/22/2014 08:17   Ct Abdomen Pelvis W Contrast  06/06/2014   CLINICAL DATA:  Shortness of breath, evaluate for pneumonia or kidney.  EXAM: CT ANGIOGRAPHY CHEST  CT ABDOMEN AND PELVIS WITH CONTRAST  TECHNIQUE: Multidetector CT imaging of the chest was performed using the standard protocol during bolus administration of intravenous contrast. Multiplanar CT image reconstructions and MIPs were obtained to evaluate the vascular anatomy. Multidetector CT imaging of the abdomen and pelvis was performed using the standard protocol during bolus administration of intravenous contrast.  CONTRAST:  143mL OMNIPAQUE IOHEXOL 350 MG/ML SOLN  COMPARISON:  CT abdomen pelvis dated 05/28/2014.  FINDINGS: CTA CHEST FINDINGS  No evidence of pulmonary embolism.  Interlobular septal thickening, upper lobe predominant, suspicious for mild interstitial edema. Associated cardiomegaly with small bilateral pleural effusions, right greater than left.  Patchy right lower lobe opacity (series  4/ image 67), atelectasis versus pneumonia. Lingular and left lower lobe opacity, likely atelectasis.  No suspicious pulmonary nodules. Underlying moderate centrilobular and paraseptal emphysematous changes. No pneumothorax.  Visualized right thyroid is mildly nodular.  Coronary atherosclerosis. Atherosclerotic calcifications of the aortic arch.  Thoracic lymphadenopathy, including:  --10 mm short axis right paratracheal node (series 4/ image 28)  --12 mm short axis prevascular node (series 4/ image 35)  --14 mm short axis right paratracheal node (series 4/ image 38)  --16 mm short axis left hilar node (series 4/image 50)  --15 mm short axis subcarinal node (series 4/ image 51)  --11 mm short axis right perihilar node (series 4/ image 52)  Degenerative changes of the visualized thoracic spine.  CT ABDOMEN and PELVIS FINDINGS  Liver is notable for a 1.7 x 1.1 cm hypoenhancing lesion in the lateral segment left hepatic lobe (  series 64/QIHKV 20), of uncertain etiology, but likely present on unenhanced CT from 2013.  Spleen is within the upper limits of normal for size.  Pancreas and right adrenal gland are within normal limits. Mild nodular thickening of the left adrenal gland, without discrete mass.  Gallbladder is mildly distended, without associated inflammatory changes. No intrahepatic or extrahepatic ductal dilatation.  Status post a right nephrectomy. 2.1 cm lateral interpolar left renal cyst (series 11/ image 38). Additional small left renal cyst. No hydronephrosis.  No evidence of bowel obstruction. Colonic diverticulosis, particularly involving the sigmoid colon. Mild pericolonic stranding/fluid along the distal sigmoid colon (series 11/image 74), raising the possibility of mild sigmoid diverticulitis. No drainable fluid collection/abscess. No free air.  Associated patent fistulous track between sigmoid colon and vaginal cuff in this patient status post hysterectomy (series 11/ images 75-80).  No adnexal  masses.  Bladder is mildly thick-walled but underdistended.  Atherosclerotic calcifications of the abdominal aorta and branch vessels.  No abdominopelvic ascites.  No suspicious abdominopelvic lymphadenopathy.  Mild degenerative changes of the lumbar spine.  Review of the MIP images confirms the above findings.  IMPRESSION: No evidence of pulmonary embolism.  Cardiomegaly with mild interstitial edema and small bilateral pleural effusions, right greater than left.  Patchy right lower lobe opacity, atelectasis versus pneumonia. Lingular and left lower lobe opacity, likely atelectasis.  Thoracic lymphadenopathy, possibly reactive, although indeterminate.  Possible mild sigmoid diverticulitis. No drainable fluid collection/abscess. No free air.  Associated patent fistulous track between the sigmoid colon and vaginal cuff.   Electronically Signed   By: Julian Hy M.D.   On: 06/06/2014 21:02   Dg Chest Portable 1 View  06/08/2014   CLINICAL DATA:  Central line placement.  EXAM: PORTABLE CHEST - 1 VIEW  COMPARISON:  CT chest 06/06/2014.  Chest x-ray 06/06/2014.  FINDINGS: Right IJ line noted with tip projected over superior vena cava. Mediastinum and hilar structures normal. Cardiomegaly with pulmonary vascular prominence and diffuse interstitial prominence is with congestive heart for pleural interstitial edema. Small left pleural effusion. No pneumothorax. Surgical clips noted in the neck.  IMPRESSION: 1. Right IJ catheter noted with tip projected over superior vena cava. 2. Congestive heart failure with pulmonary edema and small left pleural effusion.   Electronically Signed   By: Marcello Moores  Register   On: 06/08/2014 15:08   Dg Chest Portable 1 View  06/06/2014   CLINICAL DATA:  Abdominal pain and shortness of breath  EXAM: PORTABLE CHEST - 1 VIEW  COMPARISON:  05/27/2014  FINDINGS: Cardiac shadow is stable in appearance. Increasing infiltrate is noted in the medial right lung base as well as in the lateral  left lung base. Diffuse interstitial changes remain with mild interstitial edema.  IMPRESSION: Increasing bibasilar infiltrates.   Electronically Signed   By: Inez Catalina M.D.   On: 06/06/2014 16:03   Dg Chest Port 1 View  05/21/2014   CLINICAL DATA:  Difficulty breathing and cough  EXAM: PORTABLE CHEST - 1 VIEW  COMPARISON:  March 01, 2014  FINDINGS: There is cardiomegaly with pulmonary venous hypertension. There is generalized interstitial edema. There is a small area of infiltrate in the lateral right base. No adenopathy. There is atherosclerotic change in aorta.  IMPRESSION: Findings felt to represent chronic congestive heart failure. Superimposed infiltrate lateral right base.   Electronically Signed   By: Lowella Grip M.D.   On: 05/21/2014 21:25   Ir Shuntogram/ Fistulagram Right Mod Sed  05/23/2014   INDICATION: Decreased flows  at dialysis.  EXAM: DIALYSIS AV SHUNTOGRAM/FISTULAGRAM  COMPARISON:  Right upper extremity native AV fistulagram -09/20/2012  MEDICATIONS: None.  CONTRAST:  50 mL OMNIPAQUE IOHEXOL 300 MG/ML  SOLN  ANESTHESIA/SEDATION: None  FLUOROSCOPY TIME:  30 seconds.  COMPLICATIONS: None immediate  PROCEDURE: The skin overlying the right upper arm native av fistula was prepped with Betadine in a sterile fashion, and a sterile drape was applied covering the operative field. A diagnostic shunt study was performed via an 18 gauge angiocatheter introduced into venous outflow. Venous drainage was assessed to the level of the central veins in the chest. Proximal shunt was studied by reflux maneuver with temporary compression of venous outflow.  The angiocath was removed and hemostasis was achieved with manual compression. A dressing was placed. The patient tolerated the procedure well without immediate post procedural complication.  FINDINGS: The native right upper arm brachiocephalic AV fistula is widely patent through the level of the cephalic arch. There is a very minimal collateralization  of flow from the hypertrophied cephalic vein to the deep venous system of the right upper arm though these venous collaterals do not result in significant steal phenomena.  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  The central venous system of the right upper extremity is widely patent to the level of the right atrium.  Reflux shuntogram demonstrates wide patency of the arterial limb and anastomosis.  IMPRESSION: Normally functioning right upper arm brachiocephalic AV fistula without peripheral or central stenosis. No intervention performed.  ACCESS: This access remains amenable to future percutaneous interventions as clinically indicated.   Electronically Signed   By: Sandi Mariscal M.D.   On: 05/23/2014 16:54    Medications: Scheduled Meds: . sodium chloride   Intravenous Once  . albuterol  2.5 mg Nebulization Once  . amiodarone  200 mg Oral Daily  . antiseptic oral rinse  7 mL Mouth Rinse BID  . calcitRIOL  0.25 mcg Oral Q M,W,F  . cefTRIAXone (ROCEPHIN)  IV  1 g Intravenous Q24H  . darbepoetin (ARANESP) injection - DIALYSIS  120 mcg Intravenous Q Wed-HD  . insulin aspart  0-15 Units Subcutaneous TID WC  . metronidazole  500 mg Intravenous Q6H  . ondansetron (ZOFRAN) IV  4 mg Intravenous 4 times per day  . rOPINIRole  1 mg Oral BID  . sodium chloride  3 mL Intravenous Q12H  . vancomycin  125 mg Oral 4 times per day      LOS: 6 days   Erastus Bartolomei M.D. [facility] 06/12/2014, 11:43 AM Pager: [phone]  If 7PM-7AM, please contact night-coverage [email or web address] Password [email or web address]  **Disclaimer: This note was dictated with voice recognition software. Similar sounding words can inadvertently be transcribed and this note may contain transcription errors which may not have been corrected upon publication of note.**

## 2014-06-12 NOTE — Procedures (Signed)
Patient was seen on dialysis and the procedure was supervised. BFR 400 Via RUE AVF BP is 140/60.  Patient appears to be tolerating treatment well but is very sensitive to volume removal will decrease goal to 3.5L and follow bp's closely.

## 2014-06-12 NOTE — Progress Notes (Signed)
Patient ID: [name], female   DOB: 1941/06/19, 73 y.o.   MRN: [id number]     [facility]      [address] [place], [place] [address]    Phone: [phone] FAX: [phone]     Subjective: Small amount of vaginal drainage.  Tolerating fulls.  No abdominal pain.  Afebrile.   Objective:  Vital signs:  Filed Vitals:   06/12/14 0930 06/12/14 1000 06/12/14 1030 06/12/14 1053  BP: 148/56 150/64 123/40 140/60  Pulse: 79 73 71 73  Temp:      TempSrc:      Resp:      Height:      Weight:      SpO2:        Last BM Date: 06/11/14  Intake/Output   Yesterday:  09/13 0701 - 09/14 0700 In: 960 [P.O.:480; I.V.:480] Out: -  This shift:  Total I/O In: 140 [I.V.:140] Out: -    Physical Exam:  General: Pt awake/alert/oriented x4 in no acute distress  Abdomen: Soft. Nondistended. Non tender. No evidence of peritonitis. No incarcerated hernias.   Problem List:   Active Problems:   CKD (chronic kidney disease) stage V requiring chronic dialysis   DM2 (diabetes mellitus, type 2)   C. difficile colitis   Chronic respiratory failure with hypoxia   HTN (hypertension)   Pulmonary HTN   OSA (obstructive sleep apnea)   COPD (chronic obstructive pulmonary disease)   Acute respiratory failure with hypoxia   HCAP (healthcare-associated pneumonia)   Abdominal pain, acute   Colovesical fistula    Results:   Labs: Results for orders placed during the hospital encounter of 06/06/14 (from the past 72 hour(s))  GLUCOSE, CAPILLARY     Status: Abnormal   Collection Time    06/10/14 11:44 AM      Result Value Ref Range   Glucose-Capillary 111 (*) 70 - 99 mg/dL  GLUCOSE, CAPILLARY     Status: Abnormal   Collection Time    06/10/14  6:39 PM      Result Value Ref Range   Glucose-Capillary 122 (*) 70 - 99 mg/dL  GLUCOSE, CAPILLARY     Status: Abnormal   Collection Time    06/10/14  7:44 PM      Result Value Ref Range    Glucose-Capillary 131 (*) 70 - 99 mg/dL   Comment 1 Notify RN    GLUCOSE, CAPILLARY     Status: Abnormal   Collection Time    06/10/14 11:31 PM      Result Value Ref Range   Glucose-Capillary 118 (*) 70 - 99 mg/dL   Comment 1 Notify RN    GLUCOSE, CAPILLARY     Status: Abnormal   Collection Time    06/11/14  3:33 AM      Result Value Ref Range   Glucose-Capillary 125 (*) 70 - 99 mg/dL   Comment 1 Notify RN    BASIC METABOLIC PANEL     Status: Abnormal   Collection Time    06/11/14  5:42 AM      Result Value Ref Range   Sodium 132 (*) 137 - 147 mEq/L   Potassium 4.3  3.7 - 5.3 mEq/L   Chloride 97  96 - 112 mEq/L   CO2 26  19 - 32 mEq/L   Glucose, Bld 122 (*) 70 - 99 mg/dL   BUN 23  6 - 23 mg/dL   Creatinine, Ser 3.26 (*)  0.50 - 1.10 mg/dL   Calcium 8.2 (*) 8.4 - 10.5 mg/dL   GFR calc non Af Amer 13 (*) >90 mL/min   GFR calc Af Amer 15 (*) >90 mL/min   Comment: (NOTE)     The eGFR has been calculated using the CKD EPI equation.     This calculation has not been validated in all clinical situations.     eGFR's persistently <90 mL/min signify possible Chronic Kidney     Disease.   Anion gap 9  5 - 15  PHOSPHORUS     Status: Abnormal   Collection Time    06/11/14  5:42 AM      Result Value Ref Range   Phosphorus 2.2 (*) 2.3 - 4.6 mg/dL  MAGNESIUM     Status: None   Collection Time    06/11/14  5:42 AM      Result Value Ref Range   Magnesium 1.5  1.5 - 2.5 mg/dL  GLUCOSE, CAPILLARY     Status: Abnormal   Collection Time    06/11/14  8:12 AM      Result Value Ref Range   Glucose-Capillary 121 (*) 70 - 99 mg/dL  GLUCOSE, CAPILLARY     Status: None   Collection Time    06/11/14 12:26 PM      Result Value Ref Range   Glucose-Capillary 92  70 - 99 mg/dL  GLUCOSE, CAPILLARY     Status: None   Collection Time    06/11/14  4:08 PM      Result Value Ref Range   Glucose-Capillary 85  70 - 99 mg/dL  GLUCOSE, CAPILLARY     Status: Abnormal   Collection Time    06/11/14   7:29 PM      Result Value Ref Range   Glucose-Capillary 101 (*) 70 - 99 mg/dL   Comment 1 Notify RN    GLUCOSE, CAPILLARY     Status: None   Collection Time    06/11/14 11:33 PM      Result Value Ref Range   Glucose-Capillary 77  70 - 99 mg/dL   Comment 1 Notify RN    GLUCOSE, CAPILLARY     Status: None   Collection Time    06/12/14  3:45 AM      Result Value Ref Range   Glucose-Capillary 80  70 - 99 mg/dL   Comment 1 Notify RN    COMPREHENSIVE METABOLIC PANEL     Status: Abnormal   Collection Time    06/12/14  5:00 AM      Result Value Ref Range   Sodium 133 (*) 137 - 147 mEq/L   Potassium 5.3  3.7 - 5.3 mEq/L   Comment: DELTA CHECK NOTED   Chloride 98  96 - 112 mEq/L   CO2 21  19 - 32 mEq/L   Glucose, Bld 85  70 - 99 mg/dL   BUN 35 (*) 6 - 23 mg/dL   Comment: DELTA CHECK NOTED   Creatinine, Ser 4.77 (*) 0.50 - 1.10 mg/dL   Calcium 9.0  8.4 - 10.5 mg/dL   Total Protein 5.3 (*) 6.0 - 8.3 g/dL   Albumin 2.3 (*) 3.5 - 5.2 g/dL   AST 13  0 - 37 U/L   ALT 6  0 - 35 U/L   Alkaline Phosphatase 85  39 - 117 U/L   Total Bilirubin <0.2 (*) 0.3 - 1.2 mg/dL   GFR calc non Af Amer 8 (*) >90  mL/min   GFR calc Af Amer 10 (*) >90 mL/min   Comment: (NOTE)     The eGFR has been calculated using the CKD EPI equation.     This calculation has not been validated in all clinical situations.     eGFR's persistently <90 mL/min signify possible Chronic Kidney     Disease.   Anion gap 14  5 - 15  MAGNESIUM     Status: None   Collection Time    06/12/14  5:00 AM      Result Value Ref Range   Magnesium 1.6  1.5 - 2.5 mg/dL  PHOSPHORUS     Status: None   Collection Time    06/12/14  5:00 AM      Result Value Ref Range   Phosphorus 2.9  2.3 - 4.6 mg/dL  CBC     Status: Abnormal   Collection Time    06/12/14  5:00 AM      Result Value Ref Range   WBC 8.7  4.0 - 10.5 K/uL   RBC 3.92  3.87 - 5.11 MIL/uL   Hemoglobin 11.2 (*) 12.0 - 15.0 g/dL   HCT 37.8  36.0 - 46.0 %   MCV 96.4  78.0 -  100.0 fL   MCH 28.6  26.0 - 34.0 pg   MCHC 29.6 (*) 30.0 - 36.0 g/dL   RDW 17.4 (*) 11.5 - 15.5 %   Platelets 167  150 - 400 K/uL  DIFFERENTIAL     Status: Abnormal   Collection Time    06/12/14  5:00 AM      Result Value Ref Range   Neutrophils Relative % 63  43 - 77 %   Neutro Abs 5.5  1.7 - 7.7 K/uL   Lymphocytes Relative 19  12 - 46 %   Lymphs Abs 1.7  0.7 - 4.0 K/uL   Monocytes Relative 6  3 - 12 %   Monocytes Absolute 0.5  0.1 - 1.0 K/uL   Eosinophils Relative 11 (*) 0 - 5 %   Eosinophils Absolute 0.9 (*) 0.0 - 0.7 K/uL   Basophils Relative 1  0 - 1 %   Basophils Absolute 0.1  0.0 - 0.1 K/uL  TRIGLYCERIDES     Status: Abnormal   Collection Time    06/12/14  5:00 AM      Result Value Ref Range   Triglycerides 182 (*) <150 mg/dL  GLUCOSE, CAPILLARY     Status: None   Collection Time    06/12/14  7:34 AM      Result Value Ref Range   Glucose-Capillary 83  70 - 99 mg/dL    Imaging / Studies: Dg Chest Port 1 View  06/10/2014   CLINICAL DATA:  Pulmonary edema.  EXAM: PORTABLE CHEST - 1 VIEW  COMPARISON:  06/08/2014.  FINDINGS: Cardiopericardial silhouette upper limits of normal for projection. Aortic arch atherosclerosis. Unchanged RIGHT IJ central line. Interstitial pulmonary edema appears similar to the prior exam. Alveolar edema is present at the LEFT base, with small LEFT pleural effusion. RIGHT costophrenic angle excluded from view.  IMPRESSION: Unchanged moderate CHF.   Electronically Signed   By: Dereck Ligas M.D.   On: 06/10/2014 21:21    Medications / Allergies:  Scheduled Meds: . sodium chloride   Intravenous Once  . albuterol  2.5 mg Nebulization Once  . amiodarone  200 mg Oral Daily  . antiseptic oral rinse  7 mL Mouth Rinse BID  . calcitRIOL  0.25 mcg Oral Q M,W,F  . cefTRIAXone (ROCEPHIN)  IV  1 g Intravenous Q24H  . darbepoetin (ARANESP) injection - DIALYSIS  120 mcg Intravenous Q Wed-HD  . insulin aspart  0-15 Units Subcutaneous TID WC  . metronidazole   500 mg Intravenous Q6H  . ondansetron (ZOFRAN) IV  4 mg Intravenous 4 times per day  . rOPINIRole  1 mg Oral BID  . sodium chloride  3 mL Intravenous Q12H  . vancomycin  125 mg Oral 4 times per day   Continuous Infusions: . sodium chloride 10 mL/hr at 06/09/14 0830   PRN Meds:.sodium chloride, sodium chloride, acetaminophen, alteplase, feeding supplement (NEPRO CARB STEADY), feeding supplement (NEPRO CARB STEADY), heparin, ipratropium, levalbuterol, lidocaine (PF), lidocaine-prilocaine, metoprolol, morphine injection, ondansetron (ZOFRAN) IV, ondansetron, pentafluoroprop-tetrafluoroeth, sodium chloride  Antibiotics: Anti-infectives   Start     Dose/Rate Route Frequency Ordered Stop   06/08/14 1200  fluconazole (DIFLUCAN) tablet 100 mg  Status:  Discontinued     100 mg Oral Every 48 hours 06/06/14 2117 06/07/14 0729   06/07/14 1400  cefTRIAXone (ROCEPHIN) 1 g in dextrose 5 % 50 mL IVPB     1 g 100 mL/hr over 30 Minutes Intravenous Every 24 hours 06/07/14 1208     06/07/14 1200  metroNIDAZOLE (FLAGYL) IVPB 500 mg     500 mg 100 mL/hr over 60 Minutes Intravenous Every 6 hours 06/07/14 1143     06/07/14 1000  fluconazole (DIFLUCAN) tablet 50 mg  Status:  Discontinued     50 mg Oral Every 48 hours 06/06/14 2121 06/07/14 0729   06/07/14 0000  vancomycin (VANCOCIN) 50 mg/mL oral solution 125 mg     125 mg Oral 4 times per day 06/06/14 2122     06/06/14 2200  vancomycin (VANCOCIN) 125 MG capsule 125 mg  Status:  Discontinued     125 mg Oral 4 times daily 06/06/14 2117 06/06/14 2121   06/06/14 1645  vancomycin (VANCOCIN) 1,250 mg in sodium chloride 0.9 % 250 mL IVPB  Status:  Discontinued     1,250 mg 166.7 mL/hr over 90 Minutes Intravenous  Once 06/06/14 1641 06/06/14 1735   06/06/14 1645  piperacillin-tazobactam (ZOSYN) IVPB 2.25 g  Status:  Discontinued     2.25 g 100 mL/hr over 30 Minutes Intravenous 3 times per day 06/06/14 1641 06/06/14 1719      Assessment/Plan  Anemia  ESRD/HD   CAD/Atrial fibrillation  COPD on home 02  HCAP  DM  Hx C diff colitis--negative 9/8   Mild sigmoid diverticulitis -resolved Colovaginal fistula  -no increase in drainage, tolerating fulls.  Advance to a renal diet -wean off TPN -may transition to PO antibiotics 7-10 days of therapy at discharge. -I have scheduled a follow up with Dr. Joyice Faster for possible surgery on outpatient basis.  I have discussed this with the patient and she agreeable.    Erby Pian, [facility] Pager [phone]) For consults and floor pages call [phone]([id number]:30P)  06/12/2014 11:11 AM

## 2014-06-12 NOTE — Progress Notes (Signed)
Agree with above 

## 2014-06-13 LAB — GLUCOSE, CAPILLARY: GLUCOSE-CAPILLARY: 81 mg/dL (ref 70–99)

## 2014-06-13 MED ORDER — CEPHALEXIN 250 MG PO CAPS: 250.0000 mg | ORAL_CAPSULE | Freq: Two times a day (BID) | ORAL | Status: DC

## 2014-06-13 MED ORDER — METRONIDAZOLE 500 MG PO TABS: 500.0000 mg | ORAL_TABLET | Freq: Three times a day (TID) | ORAL | Status: DC

## 2014-06-13 NOTE — Progress Notes (Signed)
[name] to be D/C'd Home per MD order.  Discussed with the patient and all questions fully answered.  VSS, Skin clean, dry and intact. IV catheter discontinued intact. Site without signs and symptoms of complications. Dressing and pressure applied.  An After Visit Summary was printed and given to the patient. Patient received prescriptions.  D/c education completed with patient/family including follow up instructions, medication list, d/c activities limitations if indicated, with other d/c instructions as indicated by MD - patient able to verbalize understanding, all questions fully answered.   Patient instructed to return to ED, call 911, or call MD for any changes in condition.   Patient escorted via [place], and D/C home via private auto.  Micki Riley 06/13/2014 8:36 AM

## 2014-06-13 NOTE — Progress Notes (Signed)
Patient ID: [name], female   DOB: 04-11-1941, 73 y.o.   MRN: [id number]  [facility] Progress Note    Subjective:   Feels better   Objective:   BP 172/54  Pulse 73  Temp(Src) 97.8 F (36.6 C) (Oral)  Resp 16  Ht 5\' 4"  (1.626 m)  Wt 71.895 kg (158 lb 8 oz)  BMI 27.19 kg/m2  SpO2 98%  Intake/Output: I/O last 3 completed shifts: In: 1281.2 [P.O.:720; I.V.:361.2; IV Piggyback:200] Out: 2916 [Other:2916]   Intake/Output this shift:    Weight change: -0.073 kg (-2.6 oz)  Physical Exam: Gen:WD WN elderly WF CVS:no rub Resp:crackles at left base Abd:+BS, soft, mildly tender Ext:RUE AVF +T/B  Labs: BMET  Recent Labs Lab 06/06/14 1620 06/06/14 1710 06/07/14 0013 06/08/14 0500 06/09/14 0355 06/10/14 0905 06/11/14 0542 06/12/14 0500  NA 132*  135*  --  135* 137 133* 133* 132* 133*  K 4.7  5.0  --  4.6 4.6 5.0 4.5 4.3 5.3  CL 97  93*  --  93* 97 96 97 97 98  CO2 29  --  29 27 24 27 26 21   GLUCOSE 103*  101*  --  133* 88 134* 116* 122* 85  BUN 27*  28*  --  32* 16 26* 22 23 35*  CREATININE 5.20*  5.07* 5.21* 5.59* 3.72* 4.88* 3.87* 3.26* 4.77*  ALBUMIN 2.3*  --  2.1* 2.1* 2.2* 2.2*  --  2.3*  CALCIUM 8.3*  --  8.1* 8.2* 8.1* 8.1* 8.2* 9.0  PHOS  --   --   --   --  5.2* 3.4 2.2* 2.9   CBC  Recent Labs Lab 06/06/14 1620  06/08/14 0500 06/09/14 0355 06/10/14 0905 06/12/14 0500  WBC 12.9*  < > 10.0 9.6 8.3 8.7  NEUTROABS 10.7*  --   --  6.8  --  5.5  HGB 8.7*  10.2*  < > 10.7* 11.0* 10.6* 11.2*  HCT 30.6*  30.0*  < > 35.6* 37.4 35.9* 37.8  MCV 98.1  < > 95.2 95.9 96.2 96.4  PLT 210  < > 168 191 147* 167  < > = values in this interval not displayed.  @IMGRELPRIORS @ Medications:    . sodium chloride   Intravenous Once  . albuterol  2.5 mg Nebulization Once  . amiodarone  200 mg Oral Daily  . antiseptic oral rinse  7 mL Mouth Rinse BID  . calcitRIOL  0.25 mcg Oral Q M,W,F  . cefTRIAXone (ROCEPHIN)  IV  1 g Intravenous Q24H  .  darbepoetin (ARANESP) injection - DIALYSIS  120 mcg Intravenous Q Wed-HD  . insulin aspart  0-5 Units Subcutaneous QHS  . insulin aspart  0-9 Units Subcutaneous TID WC  . metronidazole  500 mg Intravenous Q6H  . ondansetron (ZOFRAN) IV  4 mg Intravenous 4 times per day  . rOPINIRole  1 mg Oral BID  . sodium chloride  3 mL Intravenous Q12H  . vancomycin  125 mg Oral 4 times per day   HD: MWF [facility]  4h 72kg 2/2.25 Bath 400/A1.5 RUA AVF 15ga Prof 2 Heparin none  Calcitriol 0.25 ug po TIW, Aranesp 120 ug q week  Pth 384, P 4.4, tsat 30, eKT/V 1.46    Assessment/ Plan:   1. 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 Donne Hazel 2. C. Diff colitis- on po vanco 3. ESRD: cont with HD qMWF as an outpt tomorrow  4. Anemia:on aranesp 5. 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 Nutrition:mod protein malnutrition due to poor po intake and multiple infections  7. Hypertension:stable 8. dispo- for discharge today and will be readmitted for elective repair of rectovaginal fistula.   Deleah Tison A 06/13/2014, 9:00 AM

## 2014-06-13 NOTE — Progress Notes (Signed)
R IJ CVC D/C per MD order/cath intact/vaseline pressure dsg applied/pressure x5 min/no bleeding noted. Lorri Frederick, RN

## 2014-06-13 NOTE — Discharge Summary (Signed)
Physician Discharge Summary  Patient ID: JADEE GOLEBIEWSKI MRN: [id number] DOB/AGE: Sep 01, 1941 73 y.o.  Admit date: 06/06/2014 Discharge date: 06/13/2014  Primary Care Physician:  Gilford Rile, MD  Discharge Diagnoses:   . Colovesical fistula . HCAP (healthcare-associated pneumonia) . Abdominal pain, acute . Colovesical fistula . C. difficile colitis . acute on Chronic respiratory failure with hypoxia . DM2 (diabetes mellitus, type 2) . HTN (hypertension) . ESRD on hemodialysis  . OSA (obstructive sleep apnea) . COPD (chronic obstructive pulmonary disease)  Consults:  General surgery, Dr. Donne Hazel                     Nephrology, Dr. Jonnie Finner    Allergies:   Allergies  Allergen Reactions  . Lipitor [Atorvastatin] Other (See Comments)    Causes weakness and drowsiness  . Morphine And Related Nausea And Vomiting  . Nsaids Other (See Comments)    Unknown reported by previous hospital     Discharge Medications:   Medication List    STOP taking these medications       cefUROXime 500 MG tablet  Commonly known as:  CEFTIN     fluconazole 100 MG tablet  Commonly known as:  DIFLUCAN      TAKE these medications       amiodarone 200 MG tablet  Commonly known as:  PACERONE  Take 200 mg by mouth daily.     calcium acetate 667 MG capsule  Commonly known as:  PHOSLO  Take 2,001 mg by mouth 3 (three) times daily with meals.     cephALEXin 250 MG capsule  Commonly known as:  KEFLEX  Take 1 capsule (250 mg total) by mouth 2 (two) times daily. X 10days     dicyclomine 10 MG capsule  Commonly known as:  BENTYL  Take 10 mg by mouth daily as needed for spasms.     ezetimibe 10 MG tablet  Commonly known as:  ZETIA  Take 10 mg by mouth at bedtime.     glipiZIDE 10 MG 24 hr tablet  Commonly known as:  GLUCOTROL XL  Take 10 mg by mouth daily as needed (for FSBS >150).     HYDROcodone-acetaminophen 5-325 MG per tablet  Commonly known as:  NORCO/VICODIN  Take 0.5 tablets by  mouth every 6 (six) hours as needed for moderate pain.     ipratropium 17 MCG/ACT inhaler  Commonly known as:  ATROVENT HFA  Inhale 2 puffs into the lungs every 6 (six) hours as needed for wheezing.     metoCLOPramide 5 MG tablet  Commonly known as:  REGLAN  Take 5 mg by mouth 2 (two) times daily.     metroNIDAZOLE 500 MG tablet  Commonly known as:  FLAGYL  Take 1 tablet (500 mg total) by mouth 3 (three) times daily. X 10days     multivitamin Tabs tablet  Take 1 tablet by mouth daily with lunch.     ondansetron 4 MG disintegrating tablet  Commonly known as:  ZOFRAN-ODT  Take 1 tablet (4 mg total) by mouth every 8 (eight) hours as needed for nausea or vomiting.     rOPINIRole 1 MG tablet  Commonly known as:  REQUIP  Take 1 mg by mouth 2 (two) times daily.     vancomycin 125 MG capsule  Commonly known as:  VANCOCIN  Take 1 capsule (125 mg total) by mouth 4 (four) times daily. For 16days         Brief H and P:  For complete details please refer to admission H and P, but in brief 73 year old female with history of A. fib not on blood thinner medication, COPD, prior MI, diabetes, seizure, end-stage renal disease on hemodialysis Monday Wednesday Friday, presented for evaluation of abdominal pain and worsening shortness of breath. Patient was discharged on 8/6 for acute diverticulitis noted on CT scan treated with IV Zosyn subsequently switched to ciprofloxacin and Flagyl. She was again admitted on 8/24 with shortness of breath, was diagnosed with C. difficile and pneumonia discharged on 8/29 on cefuroxime and oral vancomycin. She presented to the ED on the same day she was discharged, observed in the ER for 6 hours and because of the stability in her clinical status was discharged again.  Patient presented again to the ER on 9/8 with diarrhea, she reported that she is still taking her oral vancomycin. She tolerated her hemodialysis with a full session yesterday.  Patient noticed urine  mixed with Stool coming from both vagina and rectum. This is new. She reported worsening abdominal cramping,  persistence of diarrhea for weeks since her diagnosis of C. difficile. Continues to endorse having loose stools but has had only 3 bowel movements since this morning. She also complaining of worsening shortness of breath since yesterday. Has severe COPD and baseline. Report an episode of diffuse anterior chest pain for about 30 minutes yesterday but that has resolved. No chest pain today. She use supplemental action at home at 3 L on nasal cannula and EMS was contacted because she experiencing worsening shortness of breath. Initially patient was set at 67% on 3 L. She was placed on nonrebreather mask and sats improved to 100%. She is currently on 4 L nasal cannula with adequate oxygenation.   Hospital Course:  Patient was admitted this admission with persistent diarrhea however stool coming both from her vagina and rectum which was a new finding. She also reported increasing and worsening abdominal cramping with diarrhea. Also noticed increased shortness of breath for the past 24 hours prior to admission. She reported an episode of diffuse anterior chest pain duration 30 minutes 24 hours prior to presentation. She is also on chronic oxygen at home at 3 L per minute. Upon their arrival her saturations were initially 67% on 3 L of oxygen. She was placed on 100% nonrebreather mask and saturations improved to 100%. Upon the admitting physician's exam the patient was stable on 4 L nasal cannula oxygen. In the ER her white count was 13,300 with a normal platelet count . She was afebrile.  Since admission she underwent CT of the abdomen and pelvis which revealed a colovaginal fistula between the sigmoid colon in the vaginal cuff. Gen. surgery was consulted. They anticipate surgical intervention electively.   Acute abdominal pain 2/2 Colovaginal fistula  -Suspect related to recent episode of acute  diverticulitis with superimposed C. difficile colitis. General surgery was consulted and patient was placed on IV Rocephin and Flagyl. Patient was initially placed on TNA, however surgery decided to do the fistula repair electively outpatient. Diet was advanced and the patient is tolerating solid diet. TNA was weaned off. Patient has appointment with [facility] Dr. Marcello Moores on 6/60/63.  Recent C. difficile colitis  -most recent C. difficile PCR on 9/8 was negative, patient will finish the course of oral vancomycin   Acute on chronic respiratory failure with hypoxia/doubt HCAP. CT findings likely atelectasis versus fluid  - Currently improved, patient is at baseline  Anemia with symptoms  -post 2 units pRBCs 9/8  CKD (chronic kidney disease) stage V requiring chronic dialysis  Nephrology was consulted and patient underwent hemodialysis for her schedule. Once placed on TNA, she did have  volume overload, hence eventually discontinued once placed on solid diet.  DM2 (diabetes mellitus, type 2) : Continued on sliding scale insulin 1 patient.   HTN  Not on meds as outpt.  Paroxysmal atrial fibrillation  -Continue oral amiodarone  Chronic respiratory failure with hypoxia due to:  A) Pulmonary HTN  B) OSA (obstructive sleep apnea)  C) COPD (chronic obstructive pulmonary disease)  -All compensated at this point-on 3 L nasal cannula at home    Day of Discharge BP 172/54  Pulse 73  Temp(Src) 97.8 F (36.6 C) (Oral)  Resp 16  Ht 5\' 4"  (1.626 m)  Wt 71.895 kg (158 lb 8 oz)  BMI 27.19 kg/m2  SpO2 98%  Physical Exam: General: Alert and awake oriented x3 not in any acute distress. CVS: S1-S2 clear no murmur rubs or gallops Chest: clear to auscultation bilaterally, no wheezing rales or rhonchi Abdomen: soft nontender, nondistended, normal bowel sounds Extremities: no cyanosis, clubbing or edema noted bilaterally Neuro: Cranial nerves II-XII intact, no focal neurological deficits   The  results of significant diagnostics from this hospitalization (including imaging, microbiology, ancillary and laboratory) are listed below for reference.    LAB RESULTS: Basic Metabolic Panel:  Recent Labs Lab 06/11/14 0542 06/12/14 0500  NA 132* 133*  K 4.3 5.3  CL 97 98  CO2 26 21  GLUCOSE 122* 85  BUN 23 35*  CREATININE 3.26* 4.77*  CALCIUM 8.2* 9.0  MG 1.5 1.6  PHOS 2.2* 2.9   Liver Function Tests:  Recent Labs Lab 06/10/14 0905 06/12/14 0500  AST 10 13  ALT 6 6  ALKPHOS 80 85  BILITOT <0.2* <0.2*  PROT 5.3* 5.3*  ALBUMIN 2.2* 2.3*   No results found for this basename: LIPASE, AMYLASE,  in the last 168 hours No results found for this basename: AMMONIA,  in the last 168 hours CBC:  Recent Labs Lab 06/10/14 0905 06/12/14 0500  WBC 8.3 8.7  NEUTROABS  --  5.5  HGB 10.6* 11.2*  HCT 35.9* 37.8  MCV 96.2 96.4  PLT 147* 167   Cardiac Enzymes:  Recent Labs Lab 06/06/14 1713 06/07/14 0013  TROPONINI <0.30 <0.30   BNP: No components found with this basename: POCBNP,  CBG:  Recent Labs Lab 06/12/14 2123 06/13/14 0814  GLUCAP 138* 81    Significant Diagnostic Studies:  Ct Angio Chest Pe W/cm &/or Wo Cm  06/06/2014   CLINICAL DATA:  Shortness of breath, evaluate for pneumonia or kidney.  EXAM: CT ANGIOGRAPHY CHEST  CT ABDOMEN AND PELVIS WITH CONTRAST  TECHNIQUE: Multidetector CT imaging of the chest was performed using the standard protocol during bolus administration of intravenous contrast. Multiplanar CT image reconstructions and MIPs were obtained to evaluate the vascular anatomy. Multidetector CT imaging of the abdomen and pelvis was performed using the standard protocol during bolus administration of intravenous contrast.  CONTRAST:  127mL OMNIPAQUE IOHEXOL 350 MG/ML SOLN  COMPARISON:  CT abdomen pelvis dated 05/28/2014.  FINDINGS: CTA CHEST FINDINGS  No evidence of pulmonary embolism.  Interlobular septal thickening, upper lobe predominant, suspicious  for mild interstitial edema. Associated cardiomegaly with small bilateral pleural effusions, right greater than left.  Patchy right lower lobe opacity (series 4/ image 67), atelectasis versus pneumonia. Lingular and left lower lobe opacity, likely atelectasis.  No suspicious pulmonary nodules. Underlying moderate centrilobular and  paraseptal emphysematous changes. No pneumothorax.  Visualized right thyroid is mildly nodular.  Coronary atherosclerosis. Atherosclerotic calcifications of the aortic arch.  Thoracic lymphadenopathy, including:  --10 mm short axis right paratracheal node (series 4/ image 28)  --12 mm short axis prevascular node (series 4/ image 35)  --14 mm short axis right paratracheal node (series 4/ image 38)  --16 mm short axis left hilar node (series 4/image 50)  --15 mm short axis subcarinal node (series 4/ image 51)  --11 mm short axis right perihilar node (series 4/ image 52)  Degenerative changes of the visualized thoracic spine.  CT ABDOMEN and PELVIS FINDINGS  Liver is notable for a 1.7 x 1.1 cm hypoenhancing lesion in the lateral segment left hepatic lobe (series 16/XWRUE 20), of uncertain etiology, but likely present on unenhanced CT from 2013.  Spleen is within the upper limits of normal for size.  Pancreas and right adrenal gland are within normal limits. Mild nodular thickening of the left adrenal gland, without discrete mass.  Gallbladder is mildly distended, without associated inflammatory changes. No intrahepatic or extrahepatic ductal dilatation.  Status post a right nephrectomy. 2.1 cm lateral interpolar left renal cyst (series 11/ image 38). Additional small left renal cyst. No hydronephrosis.  No evidence of bowel obstruction. Colonic diverticulosis, particularly involving the sigmoid colon. Mild pericolonic stranding/fluid along the distal sigmoid colon (series 11/image 74), raising the possibility of mild sigmoid diverticulitis. No drainable fluid collection/abscess. No free air.   Associated patent fistulous track between sigmoid colon and vaginal cuff in this patient status post hysterectomy (series 11/ images 75-80).  No adnexal masses.  Bladder is mildly thick-walled but underdistended.  Atherosclerotic calcifications of the abdominal aorta and branch vessels.  No abdominopelvic ascites.  No suspicious abdominopelvic lymphadenopathy.  Mild degenerative changes of the lumbar spine.  Review of the MIP images confirms the above findings.  IMPRESSION: No evidence of pulmonary embolism.  Cardiomegaly with mild interstitial edema and small bilateral pleural effusions, right greater than left.  Patchy right lower lobe opacity, atelectasis versus pneumonia. Lingular and left lower lobe opacity, likely atelectasis.  Thoracic lymphadenopathy, possibly reactive, although indeterminate.  Possible mild sigmoid diverticulitis. No drainable fluid collection/abscess. No free air.  Associated patent fistulous track between the sigmoid colon and vaginal cuff.   Electronically Signed   By: Julian Hy M.D.   On: 06/06/2014 21:02   Ct Abdomen Pelvis W Contrast  06/06/2014   CLINICAL DATA:  Shortness of breath, evaluate for pneumonia or kidney.  EXAM: CT ANGIOGRAPHY CHEST  CT ABDOMEN AND PELVIS WITH CONTRAST  TECHNIQUE: Multidetector CT imaging of the chest was performed using the standard protocol during bolus administration of intravenous contrast. Multiplanar CT image reconstructions and MIPs were obtained to evaluate the vascular anatomy. Multidetector CT imaging of the abdomen and pelvis was performed using the standard protocol during bolus administration of intravenous contrast.  CONTRAST:  162mL OMNIPAQUE IOHEXOL 350 MG/ML SOLN  COMPARISON:  CT abdomen pelvis dated 05/28/2014.  FINDINGS: CTA CHEST FINDINGS  No evidence of pulmonary embolism.  Interlobular septal thickening, upper lobe predominant, suspicious for mild interstitial edema. Associated cardiomegaly with small bilateral pleural  effusions, right greater than left.  Patchy right lower lobe opacity (series 4/ image 67), atelectasis versus pneumonia. Lingular and left lower lobe opacity, likely atelectasis.  No suspicious pulmonary nodules. Underlying moderate centrilobular and paraseptal emphysematous changes. No pneumothorax.  Visualized right thyroid is mildly nodular.  Coronary atherosclerosis. Atherosclerotic calcifications of the aortic arch.  Thoracic lymphadenopathy, including:  --  10 mm short axis right paratracheal node (series 4/ image 28)  --12 mm short axis prevascular node (series 4/ image 35)  --14 mm short axis right paratracheal node (series 4/ image 38)  --16 mm short axis left hilar node (series 4/image 50)  --15 mm short axis subcarinal node (series 4/ image 51)  --11 mm short axis right perihilar node (series 4/ image 52)  Degenerative changes of the visualized thoracic spine.  CT ABDOMEN and PELVIS FINDINGS  Liver is notable for a 1.7 x 1.1 cm hypoenhancing lesion in the lateral segment left hepatic lobe (series 60/YTKZS 20), of uncertain etiology, but likely present on unenhanced CT from 2013.  Spleen is within the upper limits of normal for size.  Pancreas and right adrenal gland are within normal limits. Mild nodular thickening of the left adrenal gland, without discrete mass.  Gallbladder is mildly distended, without associated inflammatory changes. No intrahepatic or extrahepatic ductal dilatation.  Status post a right nephrectomy. 2.1 cm lateral interpolar left renal cyst (series 11/ image 38). Additional small left renal cyst. No hydronephrosis.  No evidence of bowel obstruction. Colonic diverticulosis, particularly involving the sigmoid colon. Mild pericolonic stranding/fluid along the distal sigmoid colon (series 11/image 74), raising the possibility of mild sigmoid diverticulitis. No drainable fluid collection/abscess. No free air.  Associated patent fistulous track between sigmoid colon and vaginal cuff in this  patient status post hysterectomy (series 11/ images 75-80).  No adnexal masses.  Bladder is mildly thick-walled but underdistended.  Atherosclerotic calcifications of the abdominal aorta and branch vessels.  No abdominopelvic ascites.  No suspicious abdominopelvic lymphadenopathy.  Mild degenerative changes of the lumbar spine.  Review of the MIP images confirms the above findings.  IMPRESSION: No evidence of pulmonary embolism.  Cardiomegaly with mild interstitial edema and small bilateral pleural effusions, right greater than left.  Patchy right lower lobe opacity, atelectasis versus pneumonia. Lingular and left lower lobe opacity, likely atelectasis.  Thoracic lymphadenopathy, possibly reactive, although indeterminate.  Possible mild sigmoid diverticulitis. No drainable fluid collection/abscess. No free air.  Associated patent fistulous track between the sigmoid colon and vaginal cuff.   Electronically Signed   By: Julian Hy M.D.   On: 06/06/2014 21:02   Dg Chest Portable 1 View  06/06/2014   CLINICAL DATA:  Abdominal pain and shortness of breath  EXAM: PORTABLE CHEST - 1 VIEW  COMPARISON:  05/27/2014  FINDINGS: Cardiac shadow is stable in appearance. Increasing infiltrate is noted in the medial right lung base as well as in the lateral left lung base. Diffuse interstitial changes remain with mild interstitial edema.  IMPRESSION: Increasing bibasilar infiltrates.   Electronically Signed   By: Inez Catalina M.D.   On: 06/06/2014 16:03    2D ECHO:   Disposition and Follow-up:     Discharge Instructions   Diet Carb Modified    Complete by:  As directed      Increase activity slowly    Complete by:  As directed             DISPOSITION: Home  DIET: Carb modified   DISCHARGE FOLLOW-UP Follow-up Information   Follow up with Rosario Adie., MD On 0/06/9322. (For post-hospital follow up/New patient evaluation. 06/27/14 at 10:30am, so please arrive by 10:00am to check in and fill out  paperwork.)    Specialty:  General Surgery   Contact information:   [address], [address] [place] [address] [phone]       Follow up  with Gilford Rile, MD. Schedule an appointment as soon as possible for a visit in 2 weeks. (for hospital follow-up)    Specialty:  Internal Medicine   Contact information:   [place] [address] [phone]       Time spent on Discharge: 45 mins  Signed:   RAI,RIPUDEEP M.D. [facility] 06/13/2014, 8:33 AM Pager: [phone]   **Disclaimer: This note was dictated with voice recognition software. Similar sounding words can inadvertently be transcribed and this note may contain transcription errors which may not have been corrected upon publication of note.**

## 2014-06-18 ENCOUNTER — Inpatient Hospital Stay (HOSPITAL_COMMUNITY)
Admission: AD | Admit: 2014-06-18 | Discharge: 2014-06-24 | DRG: 189 | Disposition: A | Discharge status: Home Health | Payer: Medicare Other | Source: Other Acute Inpatient Hospital | Attending: Internal Medicine | Admitting: Internal Medicine

## 2014-06-18 DIAGNOSIS — J4489 Other specified chronic obstructive pulmonary disease: Secondary | ICD-10-CM | POA: Diagnosis present

## 2014-06-18 DIAGNOSIS — Z992 Dependence on renal dialysis: Secondary | ICD-10-CM | POA: Diagnosis not present

## 2014-06-18 DIAGNOSIS — E785 Hyperlipidemia, unspecified: Secondary | ICD-10-CM | POA: Diagnosis present

## 2014-06-18 DIAGNOSIS — N829 Female genital tract fistula, unspecified: Secondary | ICD-10-CM | POA: Diagnosis present

## 2014-06-18 DIAGNOSIS — J9611 Chronic respiratory failure with hypoxia: Secondary | ICD-10-CM

## 2014-06-18 DIAGNOSIS — I4891 Unspecified atrial fibrillation: Secondary | ICD-10-CM | POA: Diagnosis present

## 2014-06-18 DIAGNOSIS — E119 Type 2 diabetes mellitus without complications: Secondary | ICD-10-CM | POA: Diagnosis present

## 2014-06-18 DIAGNOSIS — I2510 Atherosclerotic heart disease of native coronary artery without angina pectoris: Secondary | ICD-10-CM | POA: Diagnosis present

## 2014-06-18 DIAGNOSIS — I272 Pulmonary hypertension, unspecified: Secondary | ICD-10-CM

## 2014-06-18 DIAGNOSIS — D631 Anemia in chronic kidney disease: Secondary | ICD-10-CM

## 2014-06-18 DIAGNOSIS — A0472 Enterocolitis due to Clostridium difficile, not specified as recurrent: Secondary | ICD-10-CM

## 2014-06-18 DIAGNOSIS — I428 Other cardiomyopathies: Secondary | ICD-10-CM | POA: Diagnosis present

## 2014-06-18 DIAGNOSIS — E872 Acidosis, unspecified: Secondary | ICD-10-CM | POA: Diagnosis present

## 2014-06-18 DIAGNOSIS — N321 Vesicointestinal fistula: Secondary | ICD-10-CM

## 2014-06-18 DIAGNOSIS — I420 Dilated cardiomyopathy: Secondary | ICD-10-CM

## 2014-06-18 DIAGNOSIS — I739 Peripheral vascular disease, unspecified: Secondary | ICD-10-CM | POA: Diagnosis present

## 2014-06-18 DIAGNOSIS — Z87891 Personal history of nicotine dependence: Secondary | ICD-10-CM

## 2014-06-18 DIAGNOSIS — Z8249 Family history of ischemic heart disease and other diseases of the circulatory system: Secondary | ICD-10-CM | POA: Diagnosis not present

## 2014-06-18 DIAGNOSIS — K219 Gastro-esophageal reflux disease without esophagitis: Secondary | ICD-10-CM | POA: Diagnosis present

## 2014-06-18 DIAGNOSIS — J9600 Acute respiratory failure, unspecified whether with hypoxia or hypercapnia: Secondary | ICD-10-CM | POA: Diagnosis present

## 2014-06-18 DIAGNOSIS — D638 Anemia in other chronic diseases classified elsewhere: Secondary | ICD-10-CM | POA: Diagnosis present

## 2014-06-18 DIAGNOSIS — J810 Acute pulmonary edema: Secondary | ICD-10-CM

## 2014-06-18 DIAGNOSIS — Z85528 Personal history of other malignant neoplasm of kidney: Secondary | ICD-10-CM

## 2014-06-18 DIAGNOSIS — J449 Chronic obstructive pulmonary disease, unspecified: Secondary | ICD-10-CM | POA: Diagnosis present

## 2014-06-18 DIAGNOSIS — N186 End stage renal disease: Secondary | ICD-10-CM

## 2014-06-18 DIAGNOSIS — J9601 Acute respiratory failure with hypoxia: Secondary | ICD-10-CM

## 2014-06-18 DIAGNOSIS — G4733 Obstructive sleep apnea (adult) (pediatric): Secondary | ICD-10-CM | POA: Diagnosis present

## 2014-06-18 DIAGNOSIS — I120 Hypertensive chronic kidney disease with stage 5 chronic kidney disease or end stage renal disease: Secondary | ICD-10-CM | POA: Diagnosis present

## 2014-06-18 DIAGNOSIS — I447 Left bundle-branch block, unspecified: Secondary | ICD-10-CM | POA: Diagnosis present

## 2014-06-18 DIAGNOSIS — N824 Other female intestinal-genital tract fistulae: Secondary | ICD-10-CM | POA: Diagnosis present

## 2014-06-18 DIAGNOSIS — I25119 Atherosclerotic heart disease of native coronary artery with unspecified angina pectoris: Secondary | ICD-10-CM

## 2014-06-18 DIAGNOSIS — J9602 Acute respiratory failure with hypercapnia: Secondary | ICD-10-CM

## 2014-06-18 DIAGNOSIS — Z9981 Dependence on supplemental oxygen: Secondary | ICD-10-CM | POA: Diagnosis not present

## 2014-06-18 DIAGNOSIS — I5043 Acute on chronic combined systolic (congestive) and diastolic (congestive) heart failure: Secondary | ICD-10-CM | POA: Diagnosis present

## 2014-06-18 DIAGNOSIS — D72829 Elevated white blood cell count, unspecified: Secondary | ICD-10-CM | POA: Diagnosis present

## 2014-06-18 DIAGNOSIS — J410 Simple chronic bronchitis: Secondary | ICD-10-CM

## 2014-06-18 DIAGNOSIS — I509 Heart failure, unspecified: Secondary | ICD-10-CM | POA: Diagnosis present

## 2014-06-18 DIAGNOSIS — I745 Embolism and thrombosis of iliac artery: Secondary | ICD-10-CM | POA: Diagnosis present

## 2014-06-18 DIAGNOSIS — I2789 Other specified pulmonary heart diseases: Secondary | ICD-10-CM | POA: Diagnosis present

## 2014-06-18 DIAGNOSIS — I421 Obstructive hypertrophic cardiomyopathy: Secondary | ICD-10-CM | POA: Diagnosis present

## 2014-06-18 DIAGNOSIS — N2581 Secondary hyperparathyroidism of renal origin: Secondary | ICD-10-CM | POA: Diagnosis present

## 2014-06-18 DIAGNOSIS — I050 Rheumatic mitral stenosis: Secondary | ICD-10-CM | POA: Diagnosis present

## 2014-06-18 DIAGNOSIS — I252 Old myocardial infarction: Secondary | ICD-10-CM | POA: Diagnosis not present

## 2014-06-18 DIAGNOSIS — Z888 Allergy status to other drugs, medicaments and biological substances status: Secondary | ICD-10-CM | POA: Diagnosis not present

## 2014-06-18 DIAGNOSIS — G40909 Epilepsy, unspecified, not intractable, without status epilepticus: Secondary | ICD-10-CM | POA: Diagnosis present

## 2014-06-18 DIAGNOSIS — Z833 Family history of diabetes mellitus: Secondary | ICD-10-CM | POA: Diagnosis not present

## 2014-06-18 DIAGNOSIS — Z79899 Other long term (current) drug therapy: Secondary | ICD-10-CM | POA: Diagnosis not present

## 2014-06-18 DIAGNOSIS — I10 Essential (primary) hypertension: Secondary | ICD-10-CM

## 2014-06-18 DIAGNOSIS — I959 Hypotension, unspecified: Secondary | ICD-10-CM | POA: Diagnosis not present

## 2014-06-18 DIAGNOSIS — J438 Other emphysema: Secondary | ICD-10-CM

## 2014-06-18 DIAGNOSIS — N189 Chronic kidney disease, unspecified: Secondary | ICD-10-CM

## 2014-06-18 DIAGNOSIS — I480 Paroxysmal atrial fibrillation: Secondary | ICD-10-CM

## 2014-06-18 DIAGNOSIS — R0602 Shortness of breath: Secondary | ICD-10-CM | POA: Diagnosis present

## 2014-06-18 LAB — CBC
HCT: 36 % (ref 36.0–46.0)
Hemoglobin: 10.5 g/dL — ABNORMAL LOW (ref 12.0–15.0)
MCH: 28.8 pg (ref 26.0–34.0)
MCHC: 29.2 g/dL — AB (ref 30.0–36.0)
MCV: 98.9 fL (ref 78.0–100.0)
PLATELETS: 135 10*3/uL — AB (ref 150–400)
RBC: 3.64 MIL/uL — ABNORMAL LOW (ref 3.87–5.11)
RDW: 19.2 % — ABNORMAL HIGH (ref 11.5–15.5)
WBC: 7.5 10*3/uL (ref 4.0–10.5)

## 2014-06-18 LAB — RENAL FUNCTION PANEL
ALBUMIN: 3.2 g/dL — AB (ref 3.5–5.2)
Anion gap: 12 (ref 5–15)
BUN: 11 mg/dL (ref 6–23)
CALCIUM: 8.6 mg/dL (ref 8.4–10.5)
CO2: 30 mEq/L (ref 19–32)
CREATININE: 2.99 mg/dL — AB (ref 0.50–1.10)
Chloride: 101 mEq/L (ref 96–112)
GFR calc Af Amer: 17 mL/min — ABNORMAL LOW (ref >90)
GFR, EST NON AFRICAN AMERICAN: 15 mL/min — AB (ref >90)
Glucose, Bld: 101 mg/dL — ABNORMAL HIGH (ref 70–99)
PHOSPHORUS: 3.7 mg/dL (ref 2.3–4.6)
Potassium: 3.8 mEq/L (ref 3.7–5.3)
Sodium: 143 mEq/L (ref 137–147)

## 2014-06-18 LAB — TROPONIN I
Troponin I: <0.3 ng/mL (ref <0.30)
Troponin I: <0.3 ng/mL (ref <0.30)
Troponin I: <0.3 ng/mL (ref <0.30)

## 2014-06-18 LAB — GLUCOSE, CAPILLARY
GLUCOSE-CAPILLARY: 88 mg/dL (ref 70–99)
Glucose-Capillary: 101 mg/dL — ABNORMAL HIGH (ref 70–99)

## 2014-06-18 MED ORDER — ASPIRIN 300 MG RE SUPP: 300.0000 mg | RECTAL | Status: AC

## 2014-06-18 MED ORDER — ALBUMIN HUMAN 25 % IV SOLN
50.0000 g | INTRAVENOUS | Status: AC
  Administered 2014-06-18: 50 g via INTRAVENOUS
  Filled 2014-06-18: qty 200

## 2014-06-18 MED ORDER — SODIUM CHLORIDE 0.9 % IV SOLN
INTRAVENOUS | Status: DC
  Administered 2014-06-18 – 2014-06-21 (×2): via INTRAVENOUS

## 2014-06-18 MED ORDER — HEPARIN SODIUM (PORCINE) 5000 UNIT/ML IJ SOLN
5000.0000 [IU] | Freq: Three times a day (TID) | INTRAMUSCULAR | Status: DC
  Administered 2014-06-18 – 2014-06-19 (×4): 5000 [IU] via SUBCUTANEOUS
  Filled 2014-06-18 (×3): qty 1

## 2014-06-18 MED ORDER — ROPINIROLE HCL 1 MG PO TABS
1.0000 mg | ORAL_TABLET | Freq: Two times a day (BID) | ORAL | Status: DC
  Administered 2014-06-18 – 2014-06-24 (×12): 1 mg via ORAL
  Filled 2014-06-18 (×14): qty 1

## 2014-06-18 MED ORDER — SACCHAROMYCES BOULARDII 250 MG PO CAPS
250.0000 mg | ORAL_CAPSULE | Freq: Two times a day (BID) | ORAL | Status: DC
  Administered 2014-06-18 – 2014-06-24 (×12): 250 mg via ORAL
  Filled 2014-06-18 (×14): qty 1

## 2014-06-18 MED ORDER — ASPIRIN 81 MG PO CHEW
324.0000 mg | CHEWABLE_TABLET | ORAL | Status: AC
  Administered 2014-06-18: 324 mg via ORAL
  Filled 2014-06-18: qty 4

## 2014-06-18 MED ORDER — CETYLPYRIDINIUM CHLORIDE 0.05 % MT LIQD
7.0000 mL | Freq: Two times a day (BID) | OROMUCOSAL | Status: DC
  Administered 2014-06-18 – 2014-06-20 (×3): 7 mL via OROMUCOSAL

## 2014-06-18 MED ORDER — SODIUM CHLORIDE 0.9 % IV SOLN: 250.0000 mL | INTRAVENOUS | Status: DC | PRN

## 2014-06-18 MED ORDER — IPRATROPIUM-ALBUTEROL 0.5-2.5 (3) MG/3ML IN SOLN
3.0000 mL | RESPIRATORY_TRACT | Status: DC
  Administered 2014-06-18 – 2014-06-21 (×16): 3 mL via RESPIRATORY_TRACT
  Filled 2014-06-18 (×17): qty 3

## 2014-06-18 MED ORDER — AMIODARONE HCL 200 MG PO TABS
200.0000 mg | ORAL_TABLET | Freq: Every day | ORAL | Status: DC
  Administered 2014-06-18 – 2014-06-19 (×2): 200 mg via ORAL
  Filled 2014-06-18 (×2): qty 1

## 2014-06-18 MED ORDER — INSULIN ASPART 100 UNIT/ML ~~LOC~~ SOLN
0.0000 [IU] | SUBCUTANEOUS | Status: DC
  Administered 2014-06-19 – 2014-06-20 (×2): 2 [IU] via SUBCUTANEOUS

## 2014-06-18 NOTE — Consult Note (Signed)
[facility] Renal Consultation Note  Indication for Consultation:  Management of ESRD/hemodialysis; anemia, hypertension/volume and secondary hyperparathyroidism  HPI: [name] is a 73 y.o. female with a history of hypertension, atrial fibrillation, COPD on home oxygen, and ESRD on dialysis at the [facility] who was hospitalized 9/8 - 9/15 for C difficile colitis and a rectovaginal fistula, which will soon require surgical repair, but noted lower extremity edema after her dialysis on Friday 9/18 and worsening dyspnea since yesterday. When she could not sleep last night, even in a recliner, she presented to [facility], where her chest x-ray showed pulmonary edema, and she was transferred to [facility] for dialysis.  She usually reaches her ultrafiltration goal, but noted worsening edema and requested an extra treatment on Saturday, but was told that a chair was not available.  She denies any pain, nausea, vomiting, fever, or chills.  Her recent diarrhea, secondary to C difficile colitis, is improving slowly on PO Vancomycin and Flagyl.  Dialysis Orders:  MWF @ [facility] 4 hrs      72 kg     2K/2.25Ca      400/A1.5       Profile 2       AVF @ [facility]      No Heparin Calcitriol 0.25 mcg MWF        Aranesp 60 mcg qwk          No Venofer   Past Medical History  Diagnosis Date  . Carotid artery occlusion     bilat CEA in 2012  . C. 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 Hypertension   . Atrial fibrillation April  2013  . Pulmonary hypertension   . Secondary hyperparathyroidism, renal   . COPD (chronic obstructive pulmonary disease)   . Hypercholesterolemia   . History of MI (myocardial infarction)     "they say I had 2 years ago" (08/20/2012)  . GERD (gastroesophageal reflux disease)   . History of pneumonia     "have it alot" (08/20/2012)  . Sleep apnea     "don't wear mask anymore"  . Type II diabetes mellitus   . History of blood transfusion  2013    "3 times so far in 2013:  4 pints one time; 2 pints another; ?# last time" (08/20/2012)  . Iron deficiency anemia   . Seizures 2012    after carotid surgery  . ESRD on dialysis     "[place]; Tues, Thurs; Sat"  . On home oxygen therapy     "24/7"   . Acute diverticulitis     Oct 2013, treated medically [facility], sigmoid diverticulitits  . CHF (congestive heart failure)   . Coronary artery disease   . Seizure disorder March 2011  . Myocardial infarction   . Renal cell carcinoma 2005?  Marland Kitchen Cancer of kidney 2005  . Renal insufficiency    Past Surgical History  Procedure Laterality Date  . Nephrectomy  2005    Right, for Renal cell carcinoma  . Carotid endarterectomy  2012    bilaterally; Dr. Kellie Simmering  . Av fistula placement  Jan. 7, 2011    Right  upper arm by Dr. Kellie Simmering  . Hd catheter  Aug. 22, 2013    [facility]  . Appendectomy      childhood  . Abdominal hysterectomy  1971?  Marland Kitchen Coronary angioplasty with stent placement  1990's    "1 total"  . Coronary  angioplasty  1990's  . Av fistula repair  2013    right upper arm  . Esophagogastroduodenoscopy N/A 12/29/2012    Procedure: ESOPHAGOGASTRODUODENOSCOPY (EGD);  Surgeon: Jeryl Columbia, MD;  Location: [facility] ENDOSCOPY;  Service: Endoscopy;  Laterality: N/A;  . Colonoscopy with propofol N/A 12/31/2012    Procedure: COLONOSCOPY WITH PROPOFOL;  Surgeon: Jeryl Columbia, MD;  Location: [facility] ENDOSCOPY;  Service: Endoscopy;  Laterality: N/A;  currently IP at [place] History  Problem Relation Age of Onset  . Cancer Mother     pancreatic  . Diabetes Mother   . Stroke Father   . Coronary artery disease Father   . Heart disease Father 55    Heart Disease before age 69  . Hyperlipidemia Father   . Hypertension Father   . Heart attack Father   . Cancer Brother     Brain  . Kidney disease Brother     Kidney stones  . Emphysema Father     smoked  . Pancreatic cancer Mother    Social History  She quit smoking  about 10 years ago after using 1 1/2 packs a day during a 67.5 pack-year smoking history. She denies any history of alcohol or illicit drug use. She previously drove a dump truck and has lived with her daughter since her husband died.   Allergies  Allergen Reactions  . Lipitor [Atorvastatin] Other (See Comments)    Causes weakness and drowsiness  . Morphine And Related Nausea And Vomiting  . Nsaids Other (See Comments)    Unknown reported by previous hospital   Prior to Admission medications   Medication Sig Start Date End Date Taking? Authorizing Provider  amiodarone (PACERONE) 200 MG tablet Take 200 mg by mouth daily. 04/29/14   Historical Provider, MD  calcium acetate (PHOSLO) 667 MG capsule Take 2,001 mg by mouth 3 (three) times daily with meals.     Historical Provider, MD  cephALEXin (KEFLEX) 250 MG capsule Take 1 capsule (250 mg total) by mouth 2 (two) times daily. X 10days 06/13/14   Ripudeep Krystal Eaton, MD  dicyclomine (BENTYL) 10 MG capsule Take 10 mg by mouth daily as needed for spasms.    Historical Provider, MD  ezetimibe (ZETIA) 10 MG tablet Take 10 mg by mouth at bedtime.    Historical Provider, MD  glipiZIDE (GLUCOTROL XL) 10 MG 24 hr tablet Take 10 mg by mouth daily as needed (for FSBS >150).    Historical Provider, MD  HYDROcodone-acetaminophen (NORCO/VICODIN) 5-325 MG per tablet Take 0.5 tablets by mouth every 6 (six) hours as needed for moderate pain.    Historical Provider, MD  ipratropium (ATROVENT HFA) 17 MCG/ACT inhaler Inhale 2 puffs into the lungs every 6 (six) hours as needed for wheezing.    Historical Provider, MD  metoCLOPramide (REGLAN) 5 MG tablet Take 5 mg by mouth 2 (two) times daily.    Historical Provider, MD  metroNIDAZOLE (FLAGYL) 500 MG tablet Take 1 tablet (500 mg total) by mouth 3 (three) times daily. X 10days 06/13/14   Ripudeep Krystal Eaton, MD  multivitamin (RENA-VIT) TABS tablet Take 1 tablet by mouth daily with lunch.    Historical Provider, MD  ondansetron  (ZOFRAN-ODT) 4 MG disintegrating tablet Take 1 tablet (4 mg total) by mouth every 8 (eight) hours as needed for nausea or vomiting. 05/27/14   Everlene Balls, MD  rOPINIRole (REQUIP) 1 MG tablet Take 1 mg by mouth 2 (two) times daily.    Historical  Provider, MD  vancomycin (VANCOCIN) 125 MG capsule Take 1 capsule (125 mg total) by mouth 4 (four) times daily. For 16days 05/26/14   Domenic Polite, MD   Labs:  Results for orders placed during the hospital encounter of 06/18/14 (from the past 48 hour(s))  GLUCOSE, CAPILLARY     Status: Abnormal   Collection Time    06/18/14  9:41 AM      Result Value Ref Range   Glucose-Capillary 101 (*) 70 - 99 mg/dL   Constitutional: negative for chills, fatigue, fevers and sweats Respiratory: positive for dyspnea on exertion, negative for cough, hemoptysis and sputum Cardiovascular: positive for dyspnea, orthopnea and paroxysmal nocturnal dyspnea, negative for chest pain and palpitations Gastrointestinal: negative for abdominal pain, nausea and vomiting, improving and less frequent loose stools Genitourinary:negative, anuric Musculoskeletal:negative for arthralgias, back pain, myalgias and neck pain Neurological: negative for dizziness, gait problems, headaches, paresthesia and speech problems  Physical Exam: There were no vitals filed for this visit.   General appearance: alert, cooperative and mild distress Head: Normocephalic, without obvious abnormality, atraumatic Neck: no adenopathy, no carotid bruit, no JVD and supple, symmetrical, trachea midline Resp: Coarse breath sounds with scattered expiratory wheezes Cardio: regular rate and rhythm, S1, S2 normal, no murmur, click, rub or gallop GI: soft, non-tender; bowel sounds normal; no masses,  no organomegaly Extremities: 1+ pedal edema bilaterally, no cyanosis or wounds Neurologic: Grossly normal Dialysis Access: AVF @ [facility] with + bruit   NO LABS AVAILABLE AT THIS TIME FOR REVIEW from [facility] (not  drawn) From [place] dated 9/20 prior to transfer K 4.6 CO2 28 Ca 9.1 CPK 44 ABG (initial) 7.18/86 O2/72 PCO2 WBC 19,500 Hb 13.3  Assessment/Plan: 1. Dyspnea - worsening for 2 days, reached EDW during HD on 9/18; chest x-ray @ [facility] showed pulmonary edema. Pulmonary and peripheral edema - will need lower EDW. Acute HD today for volume removal.  2. ESRD - HD on MWF @ [facility].  HD today  3. Hypertension/volume - BP 165/76, no meds; current wt 74 kg with EDW 72, likely needs to be lowered.  4. Anemia - Aranesp 60 mcg, no Fe. 5. Metabolic bone disease -  Calcitriol 0.25 mcg on MWF, Phoslo 3 with meals. 6. Nutrition - Renal diet, multivitamin. 7. Rectovaginal fistula - outpatient repair pending, appt with Dr. Marcello Moores 9/29. 8. C diff colitis - resolving on PO Flagyl & Vancomycin. 9. A-fib - on Amiodarone, BB.  10. COPD - on neb & home O2.  11. Hx PVD - s/p B carotid endarterectomy  12. DM - per primary.  13. Hx Diverticulitis - @ [place] 8/3-6, treated    LYLES,CHARLES 06/18/2014, 11:25 AM   Attending Nephrologist: Jamal Maes, MD I have seen and examined this patient and agree with plan and recommendations in above note by [facility], PAC with highlighted additions.  Usually receives MWF HD in [place], has baseline 02 dependent COPD. She noted persistent peripheral edema post dialysis on Friday despite reaching EDW, with progressive dyspnea through the weekend. Apparently [facility] could not accommodate her for an extra treatment yesterday and she ultimately presented to [facility] with impending respiratory failure. CXR revealed pulmonary edema/pleural effusions, ABG with resp acidosis. Was treated with high flow O2 and a nitro drip and transferred here for further evaluation.  HD initiated with a UF goal of 4 liters. Anticipate will dialyze again tomorrow on her usual schedule with further volume removal as BP tolerates.      Kayn Haymore B,MD 06/18/2014 1:26  PM

## 2014-06-18 NOTE — Progress Notes (Addendum)
Hemodialysis- System clotted with 15 minutes remaining (no heparin tx), Pt rinsed back, bp low, relieved after rinseback. Given a total of 50g albumin and 100cc saline bolus x2 during tx. Only able to uf 2 L. Vitals stable, pt taken off NRB and placed on 3L [place]: sats 95-96%. Report given to primary RN

## 2014-06-18 NOTE — Progress Notes (Signed)
MD made aware of patient vital trends  06/18/14 1525 06/18/14 1531 06/18/14 1533  Vitals  Temp --  --  --   Temp src --  --  --   BP ! 96/56 mmHg ! 85/47 mmHg ! 89/51 mmHg  MAP (mmHg) --  --  --   BP Location --  --  --   BP Method --  --  --   Patient Position (if appropriate) --  --  --   Pulse Rate ! 108 ! 114 --   Pulse Rate Source --  --  --   ECG Heart Rate --  --  --   Cardiac Rhythm --  --  --   Resp --  --  --   Oxygen Therapy  SpO2 95 % --  --   O2 Device --  --  --   O2 Flow Rate (L/min) --  --  --   Pre-WUA / WUA Start  [place] Agitation Sedation Scale (RASS) --  --  --   RASS Goal --  --  --   Pain Assessment  Pain Assessment --  --  --   PCA/Epidural/Spinal Assessment  Respiratory Pattern --  --  --   Glasgow Coma Scale  Eye Opening --  --  --   Best Verbal Response (NON-intubated) --  --  --   Best Motor Response --  --  --   Glasgow Coma Scale Score --  --  --   Height and Weight  Weight --  --  --   Type of Weight --  --  --   Note  Observations --  --  --      06/18/14 1540 06/18/14 1543 06/18/14 1600  Vitals  Temp --  --  98.4 F (36.9 C)  Temp src --  --  Oral  BP ! 125/49 mmHg --  ! 91/43 mmHg  MAP (mmHg) --  --  48  BP Location Left arm --  Left arm  BP Method Automatic --  Automatic  Patient Position (if appropriate) Lying --  Lying  Pulse Rate ! 109 --  ! 107  Pulse Rate Source --  --  Monitor  ECG Heart Rate --  --  ! 118  Cardiac Rhythm --  --  ST  Resp 16 --  ! 27  Oxygen Therapy  SpO2 94 % --  96 %  O2 Device Nasal cannula --  Nasal cannula  O2 Flow Rate (L/min) 3 L/min --  3 L/min  Pre-WUA / WUA Start  [place] Agitation Sedation Scale (RASS) --  --  0  RASS Goal --  --  0  Pain Assessment  Pain Assessment No/denies pain --  No/denies pain  PCA/Epidural/Spinal Assessment  Respiratory Pattern --  Regular Regular  Glasgow Coma Scale  Eye Opening --  --  4  Best Verbal Response (NON-intubated) --  --  5  Best Motor Response  --  --  6  Glasgow Coma Scale Score --  --  15  Height and Weight  Weight 71.5 kg (157 lb 10.1 oz) --  --   Type of Weight Post-Dialysis --  --   Note  Observations --  --  done     06/18/14 1610 06/18/14 1700  Vitals  Temp --  --   Temp src --  --   BP --  ! 74/42 mmHg  MAP (mmHg) --  50  BP Location --  --   BP  Method --  --   Patient Position (if appropriate) --  --   Pulse Rate --  ! 122  Pulse Rate Source --  --   ECG Heart Rate --  ! 124  Cardiac Rhythm --  --   Resp --  ! 26  Oxygen Therapy  SpO2 --  93 %  O2 Device Nasal cannula --   O2 Flow Rate (L/min) 3 L/min --   Pre-WUA / WUA Start  [place] Agitation Sedation Scale (RASS) --  --   RASS Goal --  --   Pain Assessment  Pain Assessment --  --   PCA/Epidural/Spinal Assessment  Respiratory Pattern --  --   Glasgow Coma Scale  Eye Opening --  --   Best Verbal Response (NON-intubated) --  --   Best Motor Response --  --   Glasgow Coma Scale Score --  --   Height and Weight  Weight --  --   Type of Weight --  --   Note  Observations --  done

## 2014-06-18 NOTE — Procedures (Signed)
I have personally attended this patient's dialysis session.  Urgent HD for pulm edema, resp distress. Goal 4 liters, 2K bath (K 4.6 at [facility] - no admit labs available) R AVF 400 Tachy at 100 but BP stable On 100% NRB with persistent phrase dyspnea/increased WOB but says more comfortable  Jamal Maes, MD [place] Pager 06/18/2014, 1:50 PM

## 2014-06-18 NOTE — H&P (Signed)
PULMONARY / CRITICAL CARE MEDICINE   Name: [name] MRN: [id number] DOB: 27-Apr-1941    ADMISSION DATE:  06/18/2014  REFERRING MD :  [facility]   CHIEF COMPLAINT:  SOB  INITIAL PRESENTATION: [name] admitted from [facility] on 9/20 with complaints of SOB.  Work up found the patient to have pulmonary edema on CXR & hypercarbic resp failure.  Tx to [facility] for evaluation for HD and further care.   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   She has had multiple recent admissions to [facility].  Initially starting 05/04/14 for acute diverticulitis.  She was readmitted 8/24-8/29 with PNA, abdominal pain and was found to have C-Diff colitis.  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  C-Diff PCR was negative at that time.  She has completed her prescribed oral vancomycin.   Presented to [facility] 9/20 after waking at night with acute shortness of breath.  ER evaluation significant for tachypnea, tachycardia, hypertension, leukocytosis, hgb 13.3, platelet 216, initial ABG ph 7.1 / 72 / 86 / 26.9, Na 142, K 4.6, Cr 6.20, AST 23/ALT 19, INR 1.1, BNP 74100, trop 0.06.  Patient was treated with high flow O2 & NTG gtt.  Repeat ABG 7.28 / 54 / 69 / 25.  CXR was notable for bilateral airspace disease & small effusions, EKG with LBBB.  She was transferred to [facility] for further evaluation.    PAST MEDICAL HISTORY :  Past Medical History  Diagnosis Date  . Carotid artery occlusion     bilat CEA in 2012  . C.  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 Hypertension   . Atrial fibrillation April  2013  . Pulmonary hypertension   . Secondary hyperparathyroidism, renal   . COPD (chronic obstructive pulmonary disease)   . Hypercholesterolemia   . History of MI (myocardial infarction)     "they say I had 2 years ago" (08/20/2012)  . GERD (gastroesophageal reflux disease)   . History of pneumonia     "have it alot" (08/20/2012)  . Sleep apnea     "don't wear mask anymore"  . Type II diabetes mellitus   . History of blood transfusion 2013    "3 times so far in 2013:  4 pints one time; 2 pints another; ?# last time" (08/20/2012)  . Iron deficiency anemia   . Seizures 2012    after carotid surgery  . ESRD on dialysis     "[place]; Tues, Thurs; Sat"  . On home oxygen therapy     "24/7"   . Acute diverticulitis     Oct 2013, treated medically [facility], sigmoid diverticulitits  . CHF (congestive heart failure)   . Coronary artery disease   . Seizure disorder March 2011  . Myocardial infarction   . Renal cell carcinoma 2005?  Marland Kitchen Cancer of kidney 2005  . Renal insufficiency    Past Surgical History  Procedure Laterality Date  .  Nephrectomy  2005    Right, for Renal cell carcinoma  . Carotid endarterectomy  2012    bilaterally; Dr. Kellie Simmering  . Av fistula placement  Jan. 7, 2011    Right  upper arm by Dr. Kellie Simmering  . Hd catheter  Aug. 22, 2013    [facility]  . Appendectomy      childhood  . Abdominal hysterectomy  1971?  Marland Kitchen Coronary angioplasty with stent placement  1990's    "1 total"  . Coronary angioplasty  1990's  . Av fistula repair  2013    right upper arm  . Esophagogastroduodenoscopy N/A 12/29/2012    Procedure: ESOPHAGOGASTRODUODENOSCOPY (EGD);  Surgeon: Jeryl Columbia, MD;  Location: [facility] ENDOSCOPY;  Service: Endoscopy;  Laterality: N/A;  . Colonoscopy with propofol N/A 12/31/2012    Procedure: COLONOSCOPY WITH PROPOFOL;  Surgeon: Jeryl Columbia, MD;   Location: [facility] ENDOSCOPY;  Service: Endoscopy;  Laterality: N/A;  currently IP at [place]   Prior to Admission medications   Medication Sig Start Date End Date Taking? Authorizing Provider  amiodarone (PACERONE) 200 MG tablet Take 200 mg by mouth daily. 04/29/14   Historical Provider, MD  calcium acetate (PHOSLO) 667 MG capsule Take 2,001 mg by mouth 3 (three) times daily with meals.     Historical Provider, MD  cephALEXin (KEFLEX) 250 MG capsule Take 1 capsule (250 mg total) by mouth 2 (two) times daily. X 10days 06/13/14   Ripudeep Krystal Eaton, MD  dicyclomine (BENTYL) 10 MG capsule Take 10 mg by mouth daily as needed for spasms.    Historical Provider, MD  ezetimibe (ZETIA) 10 MG tablet Take 10 mg by mouth at bedtime.    Historical Provider, MD  glipiZIDE (GLUCOTROL XL) 10 MG 24 hr tablet Take 10 mg by mouth daily as needed (for FSBS >150).    Historical Provider, MD  HYDROcodone-acetaminophen (NORCO/VICODIN) 5-325 MG per tablet Take 0.5 tablets by mouth every 6 (six) hours as needed for moderate pain.    Historical Provider, MD  ipratropium (ATROVENT HFA) 17 MCG/ACT inhaler Inhale 2 puffs into the lungs every 6 (six) hours as needed for wheezing.    Historical Provider, MD  metoCLOPramide (REGLAN) 5 MG tablet Take 5 mg by mouth 2 (two) times daily.    Historical Provider, MD  metroNIDAZOLE (FLAGYL) 500 MG tablet Take 1 tablet (500 mg total) by mouth 3 (three) times daily. X 10days 06/13/14   Ripudeep Krystal Eaton, MD  multivitamin (RENA-VIT) TABS tablet Take 1 tablet by mouth daily with lunch.    Historical Provider, MD  ondansetron (ZOFRAN-ODT) 4 MG disintegrating tablet Take 1 tablet (4 mg total) by mouth every 8 (eight) hours as needed for nausea or vomiting. 05/27/14   Everlene Balls, MD  rOPINIRole (REQUIP) 1 MG tablet Take 1 mg by mouth 2 (two) times daily.    Historical Provider, MD  vancomycin (VANCOCIN) 125 MG capsule Take 1 capsule (125 mg total) by mouth 4 (four) times daily. For 16days 05/26/14   Domenic Polite, MD   Allergies  Allergen Reactions  . Lipitor [Atorvastatin] Other (See Comments)    Causes weakness and drowsiness  . Morphine And Related Nausea And Vomiting  . Nsaids Other (See Comments)    Unknown reported by previous hospital    FAMILY HISTORY:  Family History  Problem Relation Age of Onset  . Cancer Mother     pancreatic  . Diabetes Mother   . Stroke Father   . Coronary artery  disease Father   . Heart disease Father 27    Heart Disease before age 85  . Hyperlipidemia Father   . Hypertension Father   . Heart attack Father   . Cancer Brother     Brain  . Kidney disease Brother     Kidney stones  . Emphysema Father     smoked  . Pancreatic cancer Mother    SOCIAL HISTORY:  reports that she quit smoking about 10 years ago. Her smoking use included Cigarettes. She has a 67.5 pack-year smoking history. She has never used smokeless tobacco. She reports that she does not drink alcohol or use illicit drugs.  REVIEW OF SYSTEMS:   Gen: Denies fever, chills, fatigue, night sweats.   HEENT: Denies blurred vision, double vision, hearing loss, tinnitus, sinus congestion, rhinorrhea, sore throat, neck stiffness, dysphagia PULM: Denies cough, sputum production, hemoptysis, wheezing. + SOB CV: Denies paroxysmal nocturnal dyspnea, palpitations + chest tightness, orthopnea, LE swelling  GI: Denies abdominal pain, nausea, vomiting, hematochezia, melena, constipation, change in bowel habits.  + diarrhea GU: Denies dysuria, hematuria, polyuria, oliguria, urethral discharge Endocrine: Denies hot or cold intolerance, polyuria, polyphagia or appetite change Derm: Denies rash, dry skin, scaling or peeling skin change Heme: Denies easy bruising, bleeding, bleeding gums Neuro: Denies headache, numbness, weakness, slurred speech, loss of memory or consciousness   SUBJECTIVE: Pt reports feeling very SOB but improved from presentation to [place]: Weight:  [163 lb 2.3  oz (74 kg)] 163 lb 2.3 oz (74 kg) (09/20 0930)  HEMODYNAMICS:    VENTILATOR SETTINGS:    INTAKE / OUTPUT: No intake or output data in the 24 hours ending 06/18/14 1038  PHYSICAL EXAMINATION: General:  Chronically ill adult female in NAD Neuro:  AAOx4, speech clear, MAE HEENT:  Mm pink/moist, no jvd Cardiovascular:  s1s2 tachy, 1+ LE edema Lungs:  Mild dyspnea, able to speak full sentences, lungs bilaterally with crackles  Abdomen:  Obese, soft, bsx4 active  Musculoskeletal:  No acute deformities  Skin:  Warm/dry, no edema   LABS:  CBC  Recent Labs Lab 06/12/14 0500  WBC 8.7  HGB 11.2*  HCT 37.8  PLT 167   Coag's No results found for this basename: APTT, INR,  in the last 168 hours BMET  Recent Labs Lab 06/12/14 0500  NA 133*  K 5.3  CL 98  CO2 21  BUN 35*  CREATININE 4.77*  GLUCOSE 85   Electrolytes  Recent Labs Lab 06/12/14 0500  CALCIUM 9.0  MG 1.6  PHOS 2.9   Sepsis Markers No results found for this basename: LATICACIDVEN, PROCALCITON, O2SATVEN,  in the last 168 hours  ABG No results found for this basename: PHART, PCO2ART, PO2ART,  in the last 168 hours  Liver Enzymes  Recent Labs Lab 06/12/14 0500  AST 13  ALT 6  ALKPHOS 85  BILITOT <0.2*  ALBUMIN 2.3*   Cardiac Enzymes No results found for this basename: TROPONINI, PROBNP,  in the last 168 hours Glucose  Recent Labs Lab 06/12/14 1243 06/12/14 1548 06/12/14 1730 06/12/14 2123 06/13/14 0814 06/18/14 0941  GLUCAP 81 140* 101* 138* 81 101*    Imaging No results found.   ASSESSMENT / PLAN:  PULMONARY OETT n/a A: Acute Respiratory Failure - in setting of volume overload Pulmonary Edema Pleural Effusions   COPD - baseline 3L O2 dependent OSA Pulmonary HTN P:   Oxygen to support sats 90-95% PRN BiPAP for increased WOB Trend CXR Pulmonary hygiene: flutter valve,  mobilize Volume removal per HD Scheduled duonebs for now with dyspnea  Hold home  spiriva  CARDIOVASCULAR CVL n/a A:  AFib - on amiodarone, SR on admission  HTN HLD Echo 09/2012 s/o hypertrophic cardiomyopathy.  P:  Keep HR low Assess troponin Q6 x3, EKG in am  D/C NTG gtt once HD started Continue ASA, amiodarone Hold Zetia Monitor rhythm for changes Allergy to lipitor Consider cardiology consult to optimise meds since surgery planned in 2 weeks  RENAL A:   CKD / ESRD - on HD, 160 mg lasix QD at home.  AVF.  Last HD on 9/18 Volume Overload P:   Now HD, will need reassessment of dry weight requirement  Appreciate Nephrology consult Recently taken off lasix per Nephrology (last 2-3 months), makes little urine at baseline Monitor BMP  GASTROINTESTINAL A:   Diarrhea - Cdiff neg as of 9/8 P:   See ID Hold PPI given recent c-diff infection  Probiotics Hold home reglan in setting of diarrhea   HEMATOLOGIC A:   Anemia  P:  Monitor CBC Heparin for DVT prophylaxis   INFECTIOUS A:   Recent HCAP & C-Diff Colitis - completed Rx for both Diarrhea - ongoing, C-diff negative as of 9/8 Colovaginal fistula P:   Hold abx for now Monitor fever curve / leukocytosis No evidence of acute infection at this time  ENDOCRINE A:  DM P:   SSI  Hold home glipizide   NEUROLOGIC A:   No acute issues P:   RASS goal: n/a Monitor, no acute issues  TODAY'S SUMMARY: [name] with ESRD admitted with volume overload / pulmonary edema.  Await urgent HD for volume removal. UNclear if hypertrophic cardiomyopathy plays into this process.  No acute infectious process at this time. She is slated to have surgery for fistula in 2 weeks - Clearly cardiac & renal issues have ot be optimised prior   Noe Gens, NP-C [facility] Pgr: [phone] or [phone]   I have personally obtained a history, examined the patient, evaluated laboratory and imaging results, formulated the assessment and plan and placed orders.  CRITICAL CARE: The patient is  critically ill with multiple organ systems failure and requires high complexity decision making for assessment and support, frequent evaluation and titration of therapies, application of advanced monitoring technologies and extensive interpretation of multiple databases. Critical Care Time devoted to patient care services described in this note is 50 minutes.   Rigoberto Noel MD 06/18/2014, 10:38 AM

## 2014-06-19 ENCOUNTER — Inpatient Hospital Stay (HOSPITAL_COMMUNITY): Payer: Medicare Other

## 2014-06-19 ENCOUNTER — Encounter (HOSPITAL_COMMUNITY): Payer: Self-pay | Admitting: Cardiology

## 2014-06-19 DIAGNOSIS — I4891 Unspecified atrial fibrillation: Secondary | ICD-10-CM

## 2014-06-19 DIAGNOSIS — I420 Dilated cardiomyopathy: Secondary | ICD-10-CM | POA: Diagnosis not present

## 2014-06-19 DIAGNOSIS — I059 Rheumatic mitral valve disease, unspecified: Secondary | ICD-10-CM

## 2014-06-19 DIAGNOSIS — R0902 Hypoxemia: Secondary | ICD-10-CM

## 2014-06-19 DIAGNOSIS — I2510 Atherosclerotic heart disease of native coronary artery without angina pectoris: Secondary | ICD-10-CM | POA: Diagnosis present

## 2014-06-19 DIAGNOSIS — J9610 Chronic respiratory failure, unspecified whether with hypoxia or hypercapnia: Secondary | ICD-10-CM

## 2014-06-19 DIAGNOSIS — A0472 Enterocolitis due to Clostridium difficile, not specified as recurrent: Secondary | ICD-10-CM

## 2014-06-19 DIAGNOSIS — I959 Hypotension, unspecified: Secondary | ICD-10-CM | POA: Diagnosis not present

## 2014-06-19 DIAGNOSIS — J438 Other emphysema: Secondary | ICD-10-CM

## 2014-06-19 LAB — GLUCOSE, CAPILLARY
GLUCOSE-CAPILLARY: 80 mg/dL (ref 70–99)
Glucose-Capillary: 116 mg/dL — ABNORMAL HIGH (ref 70–99)
Glucose-Capillary: 138 mg/dL — ABNORMAL HIGH (ref 70–99)
Glucose-Capillary: 76 mg/dL (ref 70–99)
Glucose-Capillary: 85 mg/dL (ref 70–99)

## 2014-06-19 LAB — CBC
HCT: 38.9 % (ref 36.0–46.0)
Hemoglobin: 11.1 g/dL — ABNORMAL LOW (ref 12.0–15.0)
MCH: 29 pg (ref 26.0–34.0)
MCHC: 28.5 g/dL — ABNORMAL LOW (ref 30.0–36.0)
MCV: 101.6 fL — ABNORMAL HIGH (ref 78.0–100.0)
Platelets: 150 10*3/uL (ref 150–400)
RBC: 3.83 MIL/uL — ABNORMAL LOW (ref 3.87–5.11)
RDW: 19.6 % — ABNORMAL HIGH (ref 11.5–15.5)
WBC: 7.6 10*3/uL (ref 4.0–10.5)

## 2014-06-19 LAB — BASIC METABOLIC PANEL
Anion gap: 12 (ref 5–15)
BUN: 15 mg/dL (ref 6–23)
CO2: 29 mEq/L (ref 19–32)
Calcium: 8.7 mg/dL (ref 8.4–10.5)
Chloride: 102 mEq/L (ref 96–112)
Creatinine, Ser: 4.09 mg/dL — ABNORMAL HIGH (ref 0.50–1.10)
GFR calc Af Amer: 12 mL/min — ABNORMAL LOW (ref >90)
GFR calc non Af Amer: 10 mL/min — ABNORMAL LOW (ref >90)
Glucose, Bld: 98 mg/dL (ref 70–99)
Potassium: 4.6 mEq/L (ref 3.7–5.3)
Sodium: 143 mEq/L (ref 137–147)

## 2014-06-19 LAB — MAGNESIUM: Magnesium: 2 mg/dL (ref 1.5–2.5)

## 2014-06-19 LAB — ALBUMIN: ALBUMIN: 3.1 g/dL — AB (ref 3.5–5.2)

## 2014-06-19 LAB — PHOSPHORUS: Phosphorus: 5.8 mg/dL — ABNORMAL HIGH (ref 2.3–4.6)

## 2014-06-19 MED ORDER — LIDOCAINE-PRILOCAINE 2.5-2.5 % EX CREA: 1.0000 "application " | TOPICAL_CREAM | CUTANEOUS | Status: DC | PRN

## 2014-06-19 MED ORDER — AMIODARONE HCL IN DEXTROSE 360-4.14 MG/200ML-% IV SOLN
60.0000 mg/h | INTRAVENOUS | Status: AC
  Administered 2014-06-19: 60 mg/h via INTRAVENOUS
  Filled 2014-06-19: qty 200

## 2014-06-19 MED ORDER — SODIUM CHLORIDE 0.9 % IV SOLN: 100.0000 mL | INTRAVENOUS | Status: DC | PRN

## 2014-06-19 MED ORDER — TUBERCULIN PPD 5 UNIT/0.1ML ID SOLN
5.0000 [IU] | Freq: Once | INTRADERMAL | Status: AC
  Administered 2014-06-19: 5 [IU] via INTRADERMAL
  Filled 2014-06-19: qty 0.1

## 2014-06-19 MED ORDER — ALTEPLASE 2 MG IJ SOLR: 2.0000 mg | Freq: Once | INTRAMUSCULAR | Status: DC | PRN

## 2014-06-19 MED ORDER — INFLUENZA VAC SPLIT QUAD 0.5 ML IM SUSY
0.5000 mL | PREFILLED_SYRINGE | INTRAMUSCULAR | Status: AC
  Administered 2014-06-20: 0.5 mL via INTRAMUSCULAR
  Filled 2014-06-19: qty 0.5

## 2014-06-19 MED ORDER — ASPIRIN EC 81 MG PO TBEC
81.0000 mg | DELAYED_RELEASE_TABLET | Freq: Every day | ORAL | Status: DC
  Filled 2014-06-19: qty 1

## 2014-06-19 MED ORDER — LIDOCAINE HCL (PF) 1 % IJ SOLN: 5.0000 mL | INTRAMUSCULAR | Status: DC | PRN

## 2014-06-19 MED ORDER — PENTAFLUOROPROP-TETRAFLUOROETH EX AERO: 1.0000 "application " | INHALATION_SPRAY | CUTANEOUS | Status: DC | PRN

## 2014-06-19 MED ORDER — HEPARIN BOLUS VIA INFUSION
2000.0000 [IU] | Freq: Once | INTRAVENOUS | Status: AC
  Administered 2014-06-19: 2000 [IU] via INTRAVENOUS
  Filled 2014-06-19: qty 2000

## 2014-06-19 MED ORDER — HEPARIN SODIUM (PORCINE) 1000 UNIT/ML DIALYSIS: 1000.0000 [IU] | INTRAMUSCULAR | Status: DC | PRN

## 2014-06-19 MED ORDER — AMIODARONE HCL IN DEXTROSE 360-4.14 MG/200ML-% IV SOLN
30.0000 mg/h | INTRAVENOUS | Status: DC
  Administered 2014-06-19: 80 mL via INTRAVENOUS
  Administered 2014-06-20 – 2014-06-21 (×3): 30 mg/h via INTRAVENOUS
  Filled 2014-06-19 (×8): qty 200

## 2014-06-19 MED ORDER — HEPARIN (PORCINE) IN NACL 100-0.45 UNIT/ML-% IJ SOLN
1100.0000 [IU]/h | INTRAMUSCULAR | Status: DC
  Administered 2014-06-19: 900 [IU]/h via INTRAVENOUS
  Administered 2014-06-20 – 2014-06-21 (×2): 1100 [IU]/h via INTRAVENOUS
  Filled 2014-06-19 (×4): qty 250

## 2014-06-19 MED ORDER — AMIODARONE LOAD VIA INFUSION
150.0000 mg | Freq: Once | INTRAVENOUS | Status: DC
  Filled 2014-06-19: qty 83.34

## 2014-06-19 MED ORDER — ASPIRIN 81 MG PO CHEW
81.0000 mg | CHEWABLE_TABLET | Freq: Once | ORAL | Status: AC
  Administered 2014-06-19: 81 mg via ORAL
  Filled 2014-06-19: qty 1

## 2014-06-19 MED ORDER — AMIODARONE HCL IN DEXTROSE 360-4.14 MG/200ML-% IV SOLN
INTRAVENOUS | Status: AC
  Administered 2014-06-19: 80 mL via INTRAVENOUS
  Filled 2014-06-19: qty 200

## 2014-06-19 MED ORDER — NEPRO/CARBSTEADY PO LIQD: 237.0000 mL | ORAL | Status: DC | PRN

## 2014-06-19 NOTE — Procedures (Signed)
I was present at this dialysis session, have reviewed the session itself and made  appropriate changes  Kelly Splinter MD (pgr) [phone]    ([phone] 06/19/2014, 2:23 PM

## 2014-06-19 NOTE — Consult Note (Addendum)
Admit date: 06/18/2014 Referring Physician :  Dr. Lake Bells Primary Cardiologist  NONE Reason for Consultation  Reduced LVF  HPI: This is a 73yo female with a history of PVD with carotid artery stenosis s/p bilateral CEA, HTN, atrial fibrillation, pulmonary HTN, HOCM, O2 dependent COPD,  MI 9 years ago with PCI, DM, ESRD on HD and CHF who presented to [facility] with complaints of SOB and inability to sleep supine or in a recliner.  She had HD on Friday but post HD she still had persistent peripheral edema despite reaching her EDW.  In ER she was found to have acute pulmonary edema with hypercarbic respiratory failure and was treated with O2 and nitro gtt and transferred to [facility] for further care.  BNP was elevated at 74[address] and chest xray showed bilateral airspace disease with small effusions.  EKG showed LBBB which was present on EKG a few months ago as well.  She was placed on BiPAP.  She makes very little urine and was taken off lasix 2-3 months ago and volume is managed by HD.  She apparently is scheduled for rectovaginal fistula repair in 2 weeks.  Renal consulted and patient well get daily dialysis until chest xray clears.  2D echo was obtained which showed mild to moderate LV dysfunction with Ef 40% and new inferior and inferolateral wall motion abnormalities.  There also appeared to be a large pleural effusion.  Cardiology is now asked to consult.  While talking with patient she developed atrial fibrillation with RVR and acute hypotension.  She currently is asymptomatic and denies any chest pain, SOB, dizziness or palpitations.     PMH:   Past Medical History  Diagnosis Date  . Carotid artery occlusion     bilat CEA in 2012  . C. 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 Hypertension   . Atrial fibrillation April  2013  . Pulmonary hypertension   . Secondary hyperparathyroidism, renal   . COPD (chronic obstructive pulmonary disease)   . Hypercholesterolemia   .  History of MI (myocardial infarction)     "they say I had 2 years ago" (08/20/2012)  . GERD (gastroesophageal reflux disease)   . History of pneumonia     "have it alot" (08/20/2012)  . Sleep apnea     "don't wear mask anymore"  . Type II diabetes mellitus   . History of blood transfusion 2013    "3 times so far in 2013:  4 pints one time; 2 pints another; ?# last time" (08/20/2012)  . Iron deficiency anemia   . Seizures 2012    after carotid surgery  . ESRD on dialysis     "[place]; Tues, Thurs; Sat"  . On home oxygen therapy     "24/7"   . Acute diverticulitis     Oct 2013, treated medically [facility], sigmoid diverticulitits  . CHF (congestive heart failure)   . Seizure disorder March 2011  . Myocardial infarction   . Renal cell carcinoma 2005?  Marland Kitchen Cancer of kidney 2005  . Renal insufficiency   . Coronary artery disease     remote MI with PCI     PSH:   Past Surgical History  Procedure Laterality Date  . Nephrectomy  2005    Right, for Renal cell carcinoma  . Carotid endarterectomy  2012    bilaterally; Dr. Kellie Simmering  . Av fistula placement  Jan. 7, 2011    Right  upper arm  by Dr. Kellie Simmering  . Hd catheter  Aug. 22, 2013    [facility]  . Appendectomy      childhood  . Abdominal hysterectomy  1971?  Marland Kitchen Coronary angioplasty with stent placement  1990's    "1 total"  . Coronary angioplasty  1990's  . Av fistula repair  2013    right upper arm  . Esophagogastroduodenoscopy N/A 12/29/2012    Procedure: ESOPHAGOGASTRODUODENOSCOPY (EGD);  Surgeon: Jeryl Columbia, MD;  Location: [facility] ENDOSCOPY;  Service: Endoscopy;  Laterality: N/A;  . Colonoscopy with propofol N/A 12/31/2012    Procedure: COLONOSCOPY WITH PROPOFOL;  Surgeon: Jeryl Columbia, MD;  Location: [facility] ENDOSCOPY;  Service: Endoscopy;  Laterality: N/A;  currently IP at [place]    Allergies:  Lipitor; Morphine and related; and Nsaids Prior to Admit Meds:   Prescriptions prior to admission  Medication Sig Dispense  Refill  . ezetimibe (ZETIA) 10 MG tablet Take 10 mg by mouth at bedtime.      Marland Kitchen amiodarone (PACERONE) 200 MG tablet Take 200 mg by mouth daily.      . calcium acetate (PHOSLO) 667 MG capsule Take 2,001 mg by mouth 3 (three) times daily with meals.       . cephALEXin (KEFLEX) 250 MG capsule Take 1 capsule (250 mg total) by mouth 2 (two) times daily. X 10days  20 capsule  0  . dicyclomine (BENTYL) 10 MG capsule Take 10 mg by mouth daily as needed for spasms.      Marland Kitchen glipiZIDE (GLUCOTROL XL) 10 MG 24 hr tablet Take 10 mg by mouth daily as needed (for FSBS >150).      Marland Kitchen HYDROcodone-acetaminophen (NORCO/VICODIN) 5-325 MG per tablet Take 0.5 tablets by mouth every 6 (six) hours as needed for moderate pain.      Marland Kitchen ipratropium (ATROVENT HFA) 17 MCG/ACT inhaler Inhale 2 puffs into the lungs every 6 (six) hours as needed for wheezing.      . metoCLOPramide (REGLAN) 5 MG tablet Take 5 mg by mouth 2 (two) times daily.      . metroNIDAZOLE (FLAGYL) 500 MG tablet Take 1 tablet (500 mg total) by mouth 3 (three) times daily. X 10days  30 tablet  0  . multivitamin (RENA-VIT) TABS tablet Take 1 tablet by mouth daily with lunch.      . ondansetron (ZOFRAN-ODT) 4 MG disintegrating tablet Take 1 tablet (4 mg total) by mouth every 8 (eight) hours as needed for nausea or vomiting.  10 tablet  0  . rOPINIRole (REQUIP) 1 MG tablet Take 1 mg by mouth 2 (two) times daily.      . vancomycin (VANCOCIN) 125 MG capsule Take 1 capsule (125 mg total) by mouth 4 (four) times daily. For 16days  64 capsule  0   Fam HX:    Family History  Problem Relation Age of Onset  . Cancer Mother     pancreatic  . Diabetes Mother   . Stroke Father   . Coronary artery disease Father   . Heart disease Father 59    Heart Disease before age 73  . Hyperlipidemia Father   . Hypertension Father   . Heart attack Father   . Cancer Brother     Brain  . Kidney disease Brother     Kidney stones  . Emphysema Father     smoked  . Pancreatic  cancer Mother    Social HX:    History   Social History  . Marital  Status: Widowed    Spouse Name: N/A    Number of Children: N/A  . Years of Education: N/A   Occupational History  . Retired    Social History Main Topics  . Smoking status: Former Smoker -- 1.50 packs/day for 45 years    Types: Cigarettes    Quit date: 09/30/2003  . Smokeless tobacco: Never Used  . Alcohol Use: No  . Drug Use: No  . Sexual Activity: No   Other Topics Concern  . Not on file   Social History Narrative  . No narrative on file     ROS:  All 11 ROS were addressed and are negative except what is stated in the HPI  Physical Exam: Blood pressure 90/40, pulse 80, temperature 98.3 F (36.8 C), temperature source Oral, resp. rate 20, height 5\' 4"  (1.626 m), weight 148 lb 9.4 oz (67.4 kg), SpO2 100.00%.    General: Well developed, well nourished, in no acute distress Head: Eyes PERRLA, No xanthomas.   Normal cephalic and atramatic  Lungs:   Clear bilaterally to auscultation anteriorly. Heart:   Irregularly irregular and tachy S1 S2 Pulses are 2+ & equal.            No carotid bruit. No JVD.  No abdominal bruits. No femoral bruits. Abdomen: Bowel sounds are positive, abdomen soft and non-tender without masses Extremities:   No clubbing, cyanosis or edema.  DP +1 Neuro: Alert and oriented X 3. Psych:  Good affect, responds appropriately    Labs:   Lab Results  Component Value Date   WBC 7.6 06/19/2014   HGB 11.1* 06/19/2014   HCT 38.9 06/19/2014   MCV 101.6* 06/19/2014   PLT 150 06/19/2014     Recent Labs Lab 06/19/14 0815  NA 143  K 4.6  CL 102  CO2 29  BUN 15  CREATININE 4.09*  CALCIUM 8.7  GLUCOSE 98   No results found for this basename: PTT   Lab Results  Component Value Date   INR 1.17 06/08/2014   INR 0.93 08/27/2013   INR 1.25 06/27/2013   Lab Results  Component Value Date   CKTOTAL 77 10/19/2012   CKMB 5.3* 10/19/2012   TROPONINI <0.30 06/18/2014     No results  found for this basename: CHOL   No results found for this basename: HDL   No results found for this basename: [facility]   Lab Results  Component Value Date   TRIG 182* 06/12/2014   TRIG 88 06/09/2014   No results found for this basename: CHOLHDL   No results found for this basename: LDLDIRECT      Radiology:  Dg Chest Port 1 View  06/19/2014   CLINICAL DATA:  Followup pulmonary edema.  EXAM: PORTABLE CHEST - 1 VIEW  COMPARISON:  Yesterday.  FINDINGS: The cardiac silhouette remains borderline enlarged. Mild increase in prominence of the pulmonary vasculature and interstitial markings with Kerley lines. Mildly increased airspace opacity on the left and mildly improved airspace opacity on the right. Small bilateral pleural effusions.  IMPRESSION: Overall little change in changes of congestive heart failure.   Electronically Signed   By: Enrique Sack M.D.   On: 06/19/2014 07:41    EKG:  NSR with LBBB - present on EKG in July but new at that time from EKG 08/2013  ASSESSMENT:  1.  Acute respiratory failure secondary to acute pulmonary edema from volume overload.   2.  Acute systolic CHF from volume overload and possibly coronary  ischemia.  She has a new LBBB and new wall motion abnormalities on echo.   3.  History of HOCM with moderate LVH on echo 4.  Mild to moderate LV dysfunction which is new from prior echo with new inferior and inferolateral wall motion abnormalities.  EF 40%.   5.  New LBBB - this was present on EKG from July 2015 but new from Dec 2014. 6.  Remote history of MI with PCI about 9 years ago. 7.  History of atrial fibrillalation - while examining her she went into a rapid atrial fibrillation with HR 115-135bpm with acute hypotension - Her CHADS2VASC score is 6.   PLAN:   1.  Given fairly new LBBB from July and new wall motion abnormalities on echo with decline in EF, she may have progression of CAD which may be contributing to CHF.  Once her fluid status had normalized would  recommend right and left heart cath to assess for progression of CAD as well as assess hemodynamics of mitral valve since there was a suggestion of mitral stenosis on her echo.   2.  Would like to add low dose Coreg but her BP has been soft today with SBP in the 90-100's.   3.  Would like to start daily ASA but she has a history of significant anemia with blood transfusion as recent as 2 weeks ago in dialysis. ? Anemia of chronic disease. Her Hbg today is 11.1.   I have discussed anticoagulation with Dr. Jonnie Finner and he says patient is fine to have ASA 81mg  daily and also ok to place on IV Heparin for afib.  Will check stool hemoccult daily.  If she has not had a GI workup recently then would recommend pursuing that since she really should be on long term anticoagulation for her afib. 4.  Check FLP in am and consider adding on statin therapy with simvastatin.  She has been intolerant of Lipitor in the past with weakness and was only on Zetia as an outpt. 5.  Start IV Heparin gtt due to afib.  Given her high CHADS2VASC score and history of PAF in the past she needs to be on longterm anticoagulation. 6.  Acute hypotension secondary to rapid atrial fibrillation.  She had HD today and pulled off 4L of fluid.  Discussed with Dr. Jamal Collin.  He recommends giving her back 1L of NS IVF and if SBP improves to the 90's will start IV Amio.  I have spent a total of 60 minutes of direct patient care with patient who is critically ill with acute hypotension and rapid atrial fibrillation.  Sueanne Margarita, MD  06/19/2014  5:58 PM

## 2014-06-19 NOTE — Progress Notes (Signed)
Utilization Review Completed.  

## 2014-06-19 NOTE — Progress Notes (Signed)
    Seen by Dr. Radford Pax in consultation late this afternoon. Noted to develop rapid atrial fibrillation associated with hypotension around the time of consultation, and was given a fluid bolus followed by amiodarone bolus. I checked on her this evening, she appeared comfortable in no distress, no active chest pain. Heart rate had come down into the 100-110 range in atrial fibrillation, systolic blood pressure 648. Amiodarone bolus has been completed with plan to initiate drip. Also started on heparin per pharmacy. No change at this time.  Satira Sark, M.D., F.A.C.C.

## 2014-06-19 NOTE — Progress Notes (Signed)
PULMONARY / CRITICAL CARE MEDICINE   Name: [name] MRN: [id number] DOB: May 17, 1941    ADMISSION DATE:  06/18/2014  REFERRING MD :  [facility]   CHIEF COMPLAINT:  SOB  INITIAL PRESENTATION: [name] admitted from [facility] on 9/20 with complaints of SOB.  Work up found the patient to have pulmonary edema on CXR & hypercarbic resp failure.  Tx to [facility] for evaluation for HD and further care.   STUDIES:  9/20 ECHO > LVEF 40%, wall motion abnormalities, left atrium dilated, RV function mildly reduced   SIGNIFICANT EVENTS: 9/20  Admit with volume overload / pulmonary edema 9/21 HD   SUBJECTIVE: Pt reports feeling better, lost IV, had HD today  VITAL SIGNS: Temp:  [97.6 F (36.4 C)-98.3 F (36.8 C)] 97.6 F (36.4 C) (09/21 1114) Pulse Rate:  [79-122] 80 (09/21 1524) Resp:  [8-26] 20 (09/21 1524) BP: (74-157)/(34-88) 90/40 mmHg (09/21 1600) SpO2:  [83 %-100 %] 100 % (09/21 1524) Weight:  [67.4 kg (148 lb 9.4 oz)-71.9 kg (158 lb 8.2 oz)] 67.4 kg (148 lb 9.4 oz) (09/21 1524)  HEMODYNAMICS:    VENTILATOR SETTINGS:    INTAKE / OUTPUT:  Intake/Output Summary (Last 24 hours) at 06/19/14 1612 Last data filed at 06/19/14 1600  Gross per 24 hour  Intake    158 ml  Output   4002 ml  Net  -3844 ml    PHYSICAL EXAMINATION: Gen: awake, alert, feels well HEENT: NCAT, EOMi PULM: CTA B CV: ireg irreg, slight murmur AB: BS+, soft, nontender Ext: warm, no edema Neuro: A&Ox4, maew  LABS:  CBC  Recent Labs Lab 06/18/14 1755 06/19/14 0815  WBC 7.5 7.6  HGB 10.5* 11.1*  HCT 36.0 38.9  PLT 135* 150   Coag's No results found for this basename: APTT, INR,  in the last 168 hours BMET  Recent Labs Lab 06/18/14 1755 06/19/14 0815  NA 143 143  K 3.8 4.6  CL 101 102  CO2 30 29  BUN 11 15  CREATININE 2.99* 4.09*  GLUCOSE 101* 98   Electrolytes  Recent Labs Lab 06/18/14 1755 06/19/14 0815  CALCIUM 8.6 8.7  MG  --  2.0  PHOS 3.7 5.8*    Sepsis Markers No results found for this basename: LATICACIDVEN, PROCALCITON, O2SATVEN,  in the last 168 hours  ABG No results found for this basename: PHART, PCO2ART, PO2ART,  in the last 168 hours  Liver Enzymes  Recent Labs Lab 06/18/14 1755 06/19/14 0815  ALBUMIN 3.2* 3.1*   Cardiac Enzymes  Recent Labs Lab 06/18/14 1350 06/18/14 1735 06/18/14 2316  TROPONINI <0.30 <0.30 <0.30   Glucose  Recent Labs Lab 06/13/14 0814 06/18/14 0941 06/18/14 2025 06/19/14 0023 06/19/14 0342 06/19/14 0832  GLUCAP 81 101* 88 76 85 80    Imaging No results found.   ASSESSMENT / PLAN:  PULMONARY OETT n/a A: Acute Respiratory Failure > in setting of volume overload Pulmonary Edema > resolved post extubation Pleural Effusions  > related to CHF COPD - baseline 3L O2 dependent OSA Pulmonary HTN P:   Oxygen to support sats 90-95%  Pulmonary hygiene: flutter valve, mobilize Scheduled duonebs  Hold home spiriva  CARDIOVASCULAR CVL n/a A:  AFib - on amiodarone, SR on admission  HTN HLD Echo 9/20> new depressed LVEF compared to prior Chronic left bundle P:  Cardiology consult Continue ASA, amiodarone Hold Zetia Monitor rhythm for changes Allergy to lipitor  RENAL A:   CKD / ESRD - on  HD.  AVF.  Last HD on 9/18 Volume Overload P:   Appreciate Nephrology consult Monitor BMP  GASTROINTESTINAL A:   Diarrhea - Cdiff neg as of 9/8 P:   See ID Hold PPI given recent c-diff infection  Probiotics Hold home reglan in setting of diarrhea   HEMATOLOGIC A:   Anemia  P:  Monitor CBC Heparin for DVT prophylaxis   INFECTIOUS A:   Recent HCAP & C-Diff Colitis - completed Rx for both Diarrhea - ongoing, C-diff negative as of 9/8 Colovaginal fistula P:   Hold abx for now Monitor fever curve / leukocytosis No evidence of acute infection at this time  ENDOCRINE A:  DM P:   SSI  Hold home glipizide   NEUROLOGIC A:   No acute issues P:   RASS goal:  n/a Monitor, no acute issues  TODAY'S SUMMARY: feels much better after HD again today, LVEF down now, will consult cardiology   Roselie Awkward, MD [place] [facility] Pager: [phone] Cell: [phone] If no response, call [phone]

## 2014-06-19 NOTE — Progress Notes (Signed)
  [facility] Progress Note   Subjective: Still SOB with minimal movements in the bed acc to RN. Pt denies CP, sob, +cough  Filed Vitals:   06/19/14 0700 06/19/14 0730 06/19/14 0839 06/19/14 0856  BP: 122/42 136/52    Pulse: 88 87    Temp:   98.1 F (36.7 C)   TempSrc:   Oral   Resp: 17 21    Height:      Weight:      SpO2: 89% 92%  97%   Exam: Alert, not in distress but easily winded Bilat fine insp rales 2/3 on L and 1/2 on R RRR no MRG Abd soft, NTND No LE or UE edema Neuro nf, ox 3 RUA AVF patent  HD: MWF [facility] 4h  72kg    2K/2.25Ca   400/A1.5   P2   AVF RUA    No Heparin  Calcitriol 0.25 mcg MWF Aranesp 60 mcg qwk No Venofer      Assessment: 1 Dyspnea d/t pulm edema and vol excess; has lost a lot of wt related to recent admit when NPO for a week 2 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 3 ESRD on HD 4 HTN no meds currently 5 Anemia cont aranesp 60 /wk 6 HPTH cont vit D, phoslo 7 Cdif infection on oral meds 8 COPD home O2, nebs 9 Afib on amio, BB 10 DM per primary  Plan- daily HD until CXR clear, this could take several days    Kelly Splinter MD  pager [phone]    cell [phone]  06/19/2014, 9:03 AM     Recent Labs Lab 06/18/14 1755  NA 143  K 3.8  CL 101  CO2 30  GLUCOSE 101*  BUN 11  CREATININE 2.99*  CALCIUM 8.6  PHOS 3.7    Recent Labs Lab 06/18/14 1755  ALBUMIN 3.2*    Recent Labs Lab 06/18/14 1755 06/19/14 0815  WBC 7.5 7.6  HGB 10.5* 11.1*  HCT 36.0 38.9  MCV 98.9 101.6*  PLT 135* 150   . amiodarone  200 mg Oral Daily  . antiseptic oral rinse  7 mL Mouth Rinse BID  . heparin  5,000 Units Subcutaneous 3 times per day  . insulin aspart  0-15 Units Subcutaneous 6 times per day  . ipratropium-albuterol  3 mL Nebulization Q4H  . rOPINIRole  1 mg Oral BID AC & HS  . saccharomyces boulardii  250 mg Oral BID   . sodium chloride Stopped (06/19/14 0300)   sodium chloride

## 2014-06-19 NOTE — Progress Notes (Signed)
  Echocardiogram 2D Echocardiogram has been performed.  Diamond Nickel 06/19/2014, 10:58 AM

## 2014-06-19 NOTE — Progress Notes (Signed)
ANTICOAGULATION CONSULT NOTE - Initial Consult  Pharmacy Consult for heparin Indication: atrial fibrillation  Allergies  Allergen Reactions  . Lipitor [Atorvastatin] Other (See Comments)    Causes weakness and drowsiness  . Morphine And Related Nausea And Vomiting  . Nsaids Other (See Comments)    Unknown reported by previous hospital    Patient Measurements: Height: 5\' 4"  (162.6 cm) Weight: 148 lb 9.4 oz (67.4 kg) IBW/kg (Calculated) : 54.7 Heparin Dosing Weight: 67 kg  Vital Signs: Temp: 98.3 F (36.8 C) (09/21 1624) Temp src: Oral (09/21 1624) BP: 90/40 mmHg (09/21 1600) Pulse Rate: 80 (09/21 1524)  Labs:  Recent Labs  06/18/14 1350 06/18/14 1735 06/18/14 1755 06/18/14 2316 06/19/14 0815  HGB  --   --  10.5*  --  11.1*  HCT  --   --  36.0  --  38.9  PLT  --   --  135*  --  150  CREATININE  --   --  2.99*  --  4.09*  TROPONINI <0.30 <0.30  --  <0.30  --     Estimated Creatinine Clearance: 11.7 ml/min (by C-G formula based on Cr of 4.09).   Medical History: Past Medical History  Diagnosis Date  . Carotid artery occlusion     bilat CEA in 2012  . C. 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 Hypertension   . Atrial fibrillation April  2013  . Pulmonary hypertension   . Secondary hyperparathyroidism, renal   . COPD (chronic obstructive pulmonary disease)   . Hypercholesterolemia   . History of MI (myocardial infarction)     "they say I had 2 years ago" (08/20/2012)  . GERD (gastroesophageal reflux disease)   . History of pneumonia     "have it alot" (08/20/2012)  . Sleep apnea     "don't wear mask anymore"  . Type II diabetes mellitus   . History of blood transfusion 2013    "3 times so far in 2013:  4 pints one time; 2 pints another; ?# last time" (08/20/2012)  . Iron deficiency anemia   . Seizures 2012    after carotid surgery  . ESRD on dialysis     "[place]; Tues, Thurs; Sat"  . On home oxygen therapy     "24/7"   . Acute  diverticulitis     Oct 2013, treated medically [facility], sigmoid diverticulitits  . CHF (congestive heart failure)   . Seizure disorder March 2011  . Myocardial infarction   . Renal cell carcinoma 2005?  Marland Kitchen Cancer of kidney 2005  . Renal insufficiency   . Coronary artery disease     remote MI with PCI    Medications:  Prescriptions prior to admission  Medication Sig Dispense Refill  . ezetimibe (ZETIA) 10 MG tablet Take 10 mg by mouth at bedtime.      Marland Kitchen amiodarone (PACERONE) 200 MG tablet Take 200 mg by mouth daily.      . calcium acetate (PHOSLO) 667 MG capsule Take 2,001 mg by mouth 3 (three) times daily with meals.       . cephALEXin (KEFLEX) 250 MG capsule Take 1 capsule (250 mg total) by mouth 2 (two) times daily. X 10days  20 capsule  0  . dicyclomine (BENTYL) 10 MG capsule Take 10 mg by mouth daily as needed for spasms.      Marland Kitchen glipiZIDE (GLUCOTROL XL) 10 MG 24 hr tablet Take 10 mg by mouth daily  as needed (for FSBS >150).      Marland Kitchen HYDROcodone-acetaminophen (NORCO/VICODIN) 5-325 MG per tablet Take 0.5 tablets by mouth every 6 (six) hours as needed for moderate pain.      Marland Kitchen ipratropium (ATROVENT HFA) 17 MCG/ACT inhaler Inhale 2 puffs into the lungs every 6 (six) hours as needed for wheezing.      . metoCLOPramide (REGLAN) 5 MG tablet Take 5 mg by mouth 2 (two) times daily.      . metroNIDAZOLE (FLAGYL) 500 MG tablet Take 1 tablet (500 mg total) by mouth 3 (three) times daily. X 10days  30 tablet  0  . multivitamin (RENA-VIT) TABS tablet Take 1 tablet by mouth daily with lunch.      . ondansetron (ZOFRAN-ODT) 4 MG disintegrating tablet Take 1 tablet (4 mg total) by mouth every 8 (eight) hours as needed for nausea or vomiting.  10 tablet  0  . rOPINIRole (REQUIP) 1 MG tablet Take 1 mg by mouth 2 (two) times daily.      . vancomycin (VANCOCIN) 125 MG capsule Take 1 capsule (125 mg total) by mouth 4 (four) times daily. For 16days  64 capsule  0    Assessment: 73 yo female with  extensive PMH currently in afib and also with hx PAF but no anticoagulation PTA.  Pharmacy now asked to begin IV heparin for high CHADSVASC score.  Baseline Hgb slightly low, but no active bleeding noted.  Pltc WNL.   Goal of Therapy:  Heparin level 0.3-0.7 units/ml Monitor platelets by anticoagulation protocol: Yes   Plan:  1. Start IV heparin with small bolus of 2000 units x 1, then start heparin gtt at 900 units/hr. 2. Check heparin level 8 hrs after gtt starts. 3. Daily heparin level and CBC. 4. F/u plans to begin oral anticoagulation as appropriate.  [name], BCPS  Clinical Pharmacist Pager [phone]  06/19/2014 6:41 PM

## 2014-06-20 ENCOUNTER — Inpatient Hospital Stay (HOSPITAL_COMMUNITY): Payer: Medicare Other

## 2014-06-20 DIAGNOSIS — J9600 Acute respiratory failure, unspecified whether with hypoxia or hypercapnia: Secondary | ICD-10-CM | POA: Diagnosis not present

## 2014-06-20 DIAGNOSIS — J449 Chronic obstructive pulmonary disease, unspecified: Secondary | ICD-10-CM

## 2014-06-20 LAB — RENAL FUNCTION PANEL
ALBUMIN: 2.8 g/dL — AB (ref 3.5–5.2)
ANION GAP: 14 (ref 5–15)
BUN: 13 mg/dL (ref 6–23)
CO2: 27 mEq/L (ref 19–32)
Calcium: 8.3 mg/dL — ABNORMAL LOW (ref 8.4–10.5)
Chloride: 100 mEq/L (ref 96–112)
Creatinine, Ser: 3.18 mg/dL — ABNORMAL HIGH (ref 0.50–1.10)
GFR calc Af Amer: 16 mL/min — ABNORMAL LOW (ref >90)
GFR, EST NON AFRICAN AMERICAN: 14 mL/min — AB (ref >90)
Glucose, Bld: 96 mg/dL (ref 70–99)
PHOSPHORUS: 4.4 mg/dL (ref 2.3–4.6)
POTASSIUM: 3.5 meq/L — AB (ref 3.7–5.3)
Sodium: 141 mEq/L (ref 137–147)

## 2014-06-20 LAB — GLUCOSE, CAPILLARY
GLUCOSE-CAPILLARY: 90 mg/dL (ref 70–99)
GLUCOSE-CAPILLARY: 118 mg/dL — AB (ref 70–99)
Glucose-Capillary: 109 mg/dL — ABNORMAL HIGH (ref 70–99)
Glucose-Capillary: 115 mg/dL — ABNORMAL HIGH (ref 70–99)
Glucose-Capillary: 123 mg/dL — ABNORMAL HIGH (ref 70–99)
Glucose-Capillary: 139 mg/dL — ABNORMAL HIGH (ref 70–99)

## 2014-06-20 LAB — CBC
HEMATOCRIT: 36.8 % (ref 36.0–46.0)
Hemoglobin: 10.6 g/dL — ABNORMAL LOW (ref 12.0–15.0)
MCH: 29.3 pg (ref 26.0–34.0)
MCHC: 28.8 g/dL — AB (ref 30.0–36.0)
MCV: 101.7 fL — AB (ref 78.0–100.0)
Platelets: 141 10*3/uL — ABNORMAL LOW (ref 150–400)
RBC: 3.62 MIL/uL — ABNORMAL LOW (ref 3.87–5.11)
RDW: 19.5 % — AB (ref 11.5–15.5)
WBC: 9 10*3/uL (ref 4.0–10.5)

## 2014-06-20 LAB — HEPARIN LEVEL (UNFRACTIONATED)
Heparin Unfractionated: 0.38 IU/mL (ref 0.30–0.70)
Heparin Unfractionated: 0.23 IU/mL — ABNORMAL LOW (ref 0.30–0.70)
Heparin Unfractionated: 0.21 IU/mL — ABNORMAL LOW (ref 0.30–0.70)

## 2014-06-20 MED ORDER — ASPIRIN EC 81 MG PO TBEC
81.0000 mg | DELAYED_RELEASE_TABLET | Freq: Every day | ORAL | Status: DC
  Administered 2014-06-20 – 2014-06-22 (×3): 81 mg via ORAL
  Filled 2014-06-20 (×4): qty 1

## 2014-06-20 MED ORDER — HEPARIN SODIUM (PORCINE) 1000 UNIT/ML DIALYSIS: 1000.0000 [IU] | INTRAMUSCULAR | Status: DC | PRN

## 2014-06-20 MED ORDER — DARBEPOETIN ALFA-POLYSORBATE 60 MCG/0.3ML IJ SOLN
60.0000 ug | INTRAMUSCULAR | Status: DC
  Filled 2014-06-20: qty 0.3

## 2014-06-20 MED ORDER — ALTEPLASE 2 MG IJ SOLR: 2.0000 mg | Freq: Once | INTRAMUSCULAR | Status: AC | PRN

## 2014-06-20 MED ORDER — CALCITRIOL 0.25 MCG PO CAPS
0.2500 ug | ORAL_CAPSULE | ORAL | Status: DC
  Administered 2014-06-21: 0.25 ug via ORAL
  Filled 2014-06-20 (×2): qty 1

## 2014-06-20 MED ORDER — INSULIN ASPART 100 UNIT/ML ~~LOC~~ SOLN: 0.0000 [IU] | Freq: Three times a day (TID) | SUBCUTANEOUS | Status: DC

## 2014-06-20 MED ORDER — SODIUM CHLORIDE 0.9 % IV SOLN: 100.0000 mL | INTRAVENOUS | Status: DC | PRN

## 2014-06-20 MED ORDER — LIDOCAINE-PRILOCAINE 2.5-2.5 % EX CREA: 1.0000 "application " | TOPICAL_CREAM | CUTANEOUS | Status: DC | PRN

## 2014-06-20 MED ORDER — ASPIRIN 81 MG PO CHEW
81.0000 mg | CHEWABLE_TABLET | ORAL | Status: AC
  Administered 2014-06-21: 81 mg via ORAL
  Filled 2014-06-20: qty 1

## 2014-06-20 MED ORDER — SODIUM CHLORIDE 0.9 % IJ SOLN: 3.0000 mL | INTRAMUSCULAR | Status: DC | PRN

## 2014-06-20 MED ORDER — NEPRO/CARBSTEADY PO LIQD
237.0000 mL | ORAL | Status: DC | PRN
  Filled 2014-06-20: qty 237

## 2014-06-20 MED ORDER — CALCIUM ACETATE 667 MG PO CAPS
2001.0000 mg | ORAL_CAPSULE | Freq: Three times a day (TID) | ORAL | Status: DC
  Administered 2014-06-20 – 2014-06-24 (×6): 2001 mg via ORAL
  Filled 2014-06-20 (×14): qty 3

## 2014-06-20 MED ORDER — SODIUM CHLORIDE 0.9 % IV SOLN: 250.0000 mL | INTRAVENOUS | Status: DC | PRN

## 2014-06-20 MED ORDER — PENTAFLUOROPROP-TETRAFLUOROETH EX AERO: 1.0000 "application " | INHALATION_SPRAY | CUTANEOUS | Status: DC | PRN

## 2014-06-20 MED ORDER — LIDOCAINE HCL (PF) 1 % IJ SOLN: 5.0000 mL | INTRAMUSCULAR | Status: DC | PRN

## 2014-06-20 MED ORDER — SODIUM CHLORIDE 0.9 % IJ SOLN
3.0000 mL | Freq: Two times a day (BID) | INTRAMUSCULAR | Status: DC
  Administered 2014-06-20: 3 mL via INTRAVENOUS

## 2014-06-20 NOTE — Progress Notes (Signed)
Subjective:  On hd  Finishing tx/ uf only 1.5 l sec to bp drop to 84/  But feels better, no sob/ noted Card plans for Card. Cath  In am    Objective Vital signs in last 24 hours: Filed Vitals:   06/20/14 1025 06/20/14 1130 06/20/14 1200 06/20/14 1211  BP: 127/47  126/49 127/47  Pulse: 78 77 78 79  Temp:    97.8 F (36.6 C)  TempSrc:    Oral  Resp: 22 20 23 25   Height:      Weight:      SpO2: 95% 100% 99% 98%   Weight change: -2.1 kg (-4 lb 10.1 oz)  Physical Exam: General: alert WF NAD on HD pleasnt  Heart: RRR, no mur, rub or gallop Lungs: Faint exp Wheeze LLL Abdomen: soft , nt, nd Extremities:no pedal edema Dialysis Access: RUA AVF patent on hd    OP HD: MWF [facility]  4h 72kg 2K/2.25Ca 400/A1.5 P2 AVF RUA No Heparin  Calcitriol 0.25 mcg MWF Aranesp 60 mcg qwk No Venofer   Problem/Plan: 1  Dyspnea d/t pulm edema and vol excess-  recent wt loss during rectovag fistula admit 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 Repeat CXR to see if pulm edema resolved. She is under dry wt and lungs sound better. For heart cath possibly tomorrow. New afib also, on IV hep, back in NSR after IV amio yesterday. There is heart cath planned for tomorrow possibly , will hold off on HD orders for now, but if CXR not clear will prob need it.  Kelly Splinter MD pager [phone]     cell [phone] 06/20/2014, 1:59 PM     Labs: Basic Metabolic Panel:  Recent Labs Lab 06/18/14 1755 06/19/14 0815 06/20/14 0837  NA 143 143 141  K 3.8 4.6 3.5*  CL 101 102 100  CO2 30 29 27   GLUCOSE 101* 98 96  BUN 11 15 13   CREATININE 2.99* 4.09* 3.18*  CALCIUM 8.6 8.7 8.3*  PHOS 3.7 5.8* 4.4   Liver Function Tests:  Recent Labs Lab 06/18/14 1755 06/19/14 0815 06/20/14 0837  ALBUMIN 3.2* 3.1* 2.8*  CBC:  Recent Labs Lab 06/18/14 1755 06/19/14 0815 06/20/14 0221  WBC 7.5 7.6 9.0  HGB 10.5* 11.1* 10.6*  HCT 36.0 38.9 36.8  MCV 98.9 101.6* 101.7*  PLT 135* 150 141*   Cardiac Enzymes:  Recent Labs Lab 06/18/14 1350 06/18/14 1735 06/18/14 2316  TROPONINI <0.30 <0.30 <0.30   CBG:  Recent Labs Lab 06/19/14 1622 06/19/14 1932 06/20/14 0023 06/20/14 0347 06/20/14 0717  GLUCAP 138* 116* 139* 115* 90  Studies/Results: Dg Chest Port 1 View  06/19/2014   CLINICAL DATA:  Followup pulmonary edema.  EXAM: PORTABLE CHEST - 1 VIEW  COMPARISON:  Yesterday.  FINDINGS: The cardiac silhouette remains borderline enlarged. Mild increase in prominence of the pulmonary vasculature and interstitial markings with Kerley lines. Mildly increased airspace opacity on the left and mildly improved airspace opacity on the right. Small bilateral pleural effusions.  IMPRESSION: Overall little change in changes of congestive heart failure.   Electronically Signed   By: Enrique Sack M.D.   On: 06/19/2014 07:41   Medications: . sodium chloride 10 mL/hr at 06/19/14 2000  . amiodarone 30 mg/hr (06/20/14 0130)  . heparin 900 Units/hr (06/19/14 2204)   . amiodarone  150 mg Intravenous Once  . antiseptic oral rinse  7 mL Mouth Rinse BID  . Influenza vac split quadrivalent PF  0.5 mL Intramuscular Tomorrow-1000  . insulin aspart  0-15 Units Subcutaneous 6 times per day  . ipratropium-albuterol  3 mL Nebulization Q4H  . rOPINIRole  1 mg Oral BID AC & HS  . saccharomyces boulardii   250 mg Oral BID  . tuberculin  5 Units Intradermal Once

## 2014-06-20 NOTE — Progress Notes (Signed)
[employer] for heparin Indication: atrial fibrillation  Allergies  Allergen Reactions  . Lipitor [Atorvastatin] Other (See Comments)    Causes weakness and drowsiness  . Morphine And Related Nausea And Vomiting  . Nsaids Other (See Comments)    Unknown reported by previous hospital    Patient Measurements: Height: 5\' 4"  (162.6 cm) Weight: 148 lb 9.4 oz (67.4 kg) IBW/kg (Calculated) : 54.7 Heparin Dosing Weight: 67 kg  Vital Signs: Temp: 98.4 F (36.9 C) (09/22 0024) Temp src: Oral (09/22 0024) BP: 131/45 mmHg (09/22 0230) Pulse Rate: 55 (09/22 0230)  Labs:  Recent Labs  06/18/14 1350 06/18/14 1735  06/18/14 1755 06/18/14 2316 06/19/14 0815 06/20/14 0221  HGB  --   --   < > 10.5*  --  11.1* 10.6*  HCT  --   --   --  36.0  --  38.9 36.8  PLT  --   --   --  135*  --  150 141*  HEPARINUNFRC  --   --   --   --   --   --  0.38  CREATININE  --   --   --  2.99*  --  4.09*  --   TROPONINI <0.30 <0.30  --   --  <0.30  --   --   < > = values in this interval not displayed.  Estimated Creatinine Clearance: 11.7 ml/min (by C-G formula based on Cr of 4.09).  Assessment: 73 yo female with afib for heparin  Goal of Therapy:  Heparin level 0.3-0.7 units/ml Monitor platelets by anticoagulation protocol: Yes   Plan:  Continue Heparin at current rate Recheck level later this morning to verify  Phillis Knack, PharmD, BCPS   06/20/2014 2:58 AM

## 2014-06-20 NOTE — Consult Note (Signed)
Admit date: 06/18/2014 Referring Physician :  Dr. Lake Bells Primary Cardiologist  NONE Reason for Consultation  Reduced LVF  HPI: This is a 72yo female with a history of PVD with carotid artery stenosis s/p bilateral CEA, HTN, atrial fibrillation, pulmonary HTN, HOCM, O2 dependent COPD,  MI 9 years ago with PCI, DM, ESRD on HD and CHF who presented to [facility] with complaints of SOB and inability to sleep supine or in a recliner.  She had HD on Friday but post HD she still had persistent peripheral edema despite reaching her EDW.  In ER she was found to have acute pulmonary edema with hypercarbic respiratory failure and was treated with O2 and nitro gtt and transferred to [facility] for further care.  BNP was elevated at [address] and chest xray showed bilateral airspace disease with small effusions.  EKG showed LBBB which was present on EKG a few months ago as well.  She was placed on BiPAP.  She makes very little urine and was taken off lasix 2-3 months ago and volume is managed by HD.  She apparently is scheduled for rectovaginal fistula repair in 2 weeks.  Renal consulted and patient well get daily dialysis until chest xray clears.  2D echo was obtained which showed mild to moderate LV dysfunction with Ef 40% and new inferior and inferolateral wall motion abnormalities.  There also appeared to be a large pleural effusion.  Cardiology is now asked to consult.  While talking with patient she developed atrial fibrillation with RVR and acute hypotension.  She currently is asymptomatic and denies any chest pain, SOB, dizziness or palpitations.  Was examined in hemodialysis.  She was started on Amio.  She has converted to NSR.  Feels much better      PMH:   Past Medical History  Diagnosis Date  . Carotid artery occlusion     bilat CEA in 2012  . C. 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 Hypertension   . Atrial fibrillation April  2013  . Pulmonary hypertension   . Secondary  hyperparathyroidism, renal   . COPD (chronic obstructive pulmonary disease)   . Hypercholesterolemia   . History of MI (myocardial infarction)     "they say I had 2 years ago" (08/20/2012)  . GERD (gastroesophageal reflux disease)   . History of pneumonia     "have it alot" (08/20/2012)  . Sleep apnea     "don't wear mask anymore"  . Type II diabetes mellitus   . History of blood transfusion 2013    "3 times so far in 2013:  4 pints one time; 2 pints another; ?# last time" (08/20/2012)  . Iron deficiency anemia   . Seizures 2012    after carotid surgery  . ESRD on dialysis     "[facility]; Tues, Thurs; Sat"  . On home oxygen therapy     "24/7"   . Acute diverticulitis     Oct 2013, treated medically [facility], sigmoid diverticulitits  . CHF (congestive heart failure)   . Seizure disorder March 2011  . Myocardial infarction   . Renal cell carcinoma 2005?  Marland Kitchen Cancer of kidney 2005  . Renal insufficiency   . Coronary artery disease     remote MI with PCI     PSH:   Past Surgical History  Procedure Laterality Date  . Nephrectomy  2005    Right, for Renal cell carcinoma  . Carotid endarterectomy  2012  bilaterally; Dr. Kellie Simmering  . Av fistula placement  Jan. 7, 2011    Right  upper arm by Dr. Kellie Simmering  . Hd catheter  Aug. 22, 2013    [facility]  . Appendectomy      childhood  . Abdominal hysterectomy  1971?  Marland Kitchen Coronary angioplasty with stent placement  1990's    "1 total"  . Coronary angioplasty  1990's  . Av fistula repair  2013    right upper arm  . Esophagogastroduodenoscopy N/A 12/29/2012    Procedure: ESOPHAGOGASTRODUODENOSCOPY (EGD);  Surgeon: Jeryl Columbia, MD;  Location: [facility] ENDOSCOPY;  Service: Endoscopy;  Laterality: N/A;  . Colonoscopy with propofol N/A 12/31/2012    Procedure: COLONOSCOPY WITH PROPOFOL;  Surgeon: Jeryl Columbia, MD;  Location: [facility] ENDOSCOPY;  Service: Endoscopy;  Laterality: N/A;  currently IP at [place]    Allergies:  Lipitor; Morphine  and related; and Nsaids Prior to Admit Meds:   Prescriptions prior to admission  Medication Sig Dispense Refill  . ezetimibe (ZETIA) 10 MG tablet Take 10 mg by mouth at bedtime.      Marland Kitchen amiodarone (PACERONE) 200 MG tablet Take 200 mg by mouth daily.      . calcium acetate (PHOSLO) 667 MG capsule Take 2,001 mg by mouth 3 (three) times daily with meals.       . cephALEXin (KEFLEX) 250 MG capsule Take 1 capsule (250 mg total) by mouth 2 (two) times daily. X 10days  20 capsule  0  . dicyclomine (BENTYL) 10 MG capsule Take 10 mg by mouth daily as needed for spasms.      Marland Kitchen glipiZIDE (GLUCOTROL XL) 10 MG 24 hr tablet Take 10 mg by mouth daily as needed (for FSBS >150).      Marland Kitchen HYDROcodone-acetaminophen (NORCO/VICODIN) 5-325 MG per tablet Take 0.5 tablets by mouth every 6 (six) hours as needed for moderate pain.      Marland Kitchen ipratropium (ATROVENT HFA) 17 MCG/ACT inhaler Inhale 2 puffs into the lungs every 6 (six) hours as needed for wheezing.      . metoCLOPramide (REGLAN) 5 MG tablet Take 5 mg by mouth 2 (two) times daily.      . metroNIDAZOLE (FLAGYL) 500 MG tablet Take 1 tablet (500 mg total) by mouth 3 (three) times daily. X 10days  30 tablet  0  . multivitamin (RENA-VIT) TABS tablet Take 1 tablet by mouth daily with lunch.      . ondansetron (ZOFRAN-ODT) 4 MG disintegrating tablet Take 1 tablet (4 mg total) by mouth every 8 (eight) hours as needed for nausea or vomiting.  10 tablet  0  . rOPINIRole (REQUIP) 1 MG tablet Take 1 mg by mouth 2 (two) times daily.      . vancomycin (VANCOCIN) 125 MG capsule Take 1 capsule (125 mg total) by mouth 4 (four) times daily. For 16days  64 capsule  0   Fam HX:    Family History  Problem Relation Age of Onset  . Cancer Mother     pancreatic  . Diabetes Mother   . Stroke Father   . Coronary artery disease Father   . Heart disease Father 80    Heart Disease before age 52  . Hyperlipidemia Father   . Hypertension Father   . Heart attack Father   . Cancer Brother      Brain  . Kidney disease Brother     Kidney stones  . Emphysema Father     smoked  .  Pancreatic cancer Mother    Social HX:    History   Social History  . Marital Status: Widowed    Spouse Name: N/A    Number of Children: N/A  . Years of Education: N/A   Occupational History  . Retired    Social History Main Topics  . Smoking status: Former Smoker -- 1.50 packs/day for 45 years    Types: Cigarettes    Quit date: 09/30/2003  . Smokeless tobacco: Never Used  . Alcohol Use: No  . Drug Use: No  . Sexual Activity: No   Other Topics Concern  . Not on file   Social History Narrative  . No narrative on file     Physical Exam: Blood pressure 127/47, pulse 78, temperature 97.3 F (36.3 C), temperature source Oral, resp. rate 22, height 5\' 4"  (1.626 m), weight 155 lb 10.3 oz (70.6 kg), SpO2 95.00%.    General: Well developed, well nourished, in no acute distress Head: Eyes PERRLA, No xanthomas.   Normal cephalic and atramatic  Lungs:   Clear bilaterally to auscultation anteriorly. Heart:   RR,             No carotid bruit. No JVD.  No abdominal bruits. No femoral bruits. Abdomen: Bowel sounds are positive, abdomen soft and non-tender without masses Extremities:   No clubbing, cyanosis or edema.  DP +1 Neuro: Alert and oriented X 3. Psych:  Good affect, responds appropriately    Labs:   Lab Results  Component Value Date   WBC 9.0 06/20/2014   HGB 10.6* 06/20/2014   HCT 36.8 06/20/2014   MCV 101.7* 06/20/2014   PLT 141* 06/20/2014     Recent Labs Lab 06/20/14 0837  NA 141  K 3.5*  CL 100  CO2 27  BUN 13  CREATININE 3.18*  CALCIUM 8.3*  GLUCOSE 96   No results found for this basename: PTT   Lab Results  Component Value Date   INR 1.17 06/08/2014   INR 0.93 08/27/2013   INR 1.25 06/27/2013   Lab Results  Component Value Date   CKTOTAL 77 10/19/2012   CKMB 5.3* 10/19/2012   TROPONINI <0.30 06/18/2014     No results found for this basename: CHOL   No  results found for this basename: HDL   No results found for this basename: [facility]   Lab Results  Component Value Date   TRIG 182* 06/12/2014   TRIG 88 06/09/2014   No results found for this basename: CHOLHDL   No results found for this basename: LDLDIRECT      Radiology:  Dg Chest Port 1 View  06/19/2014   CLINICAL DATA:  Followup pulmonary edema.  EXAM: PORTABLE CHEST - 1 VIEW  COMPARISON:  Yesterday.  FINDINGS: The cardiac silhouette remains borderline enlarged. Mild increase in prominence of the pulmonary vasculature and interstitial markings with Kerley lines. Mildly increased airspace opacity on the left and mildly improved airspace opacity on the right. Small bilateral pleural effusions.  IMPRESSION: Overall little change in changes of congestive heart failure.   Electronically Signed   By: Enrique Sack M.D.   On: 06/19/2014 07:41    EKG:  NSR with LBBB - present on EKG in July but new at that time from EKG 08/2013  ASSESSMENT:  1.  Acute respiratory failure secondary to acute pulmonary edema from volume overload.  Better after dialysis.    2.  Acute systolic CHF from volume overload and possibly coronary ischemia.  She  has a new LBBB and new wall motion abnormalities on echo.   We will anticipate doing a R and L heart  cath once she has improved.  We could potentially do this tomorrow.   Will see how she does today.  Will give her a clear liquid breakfast tomorrow in anticipation of cath around 2 PM by [facility] Julian or [facility] [place].    Will anticipate starting coreg soon  Following pro-BNP will not be useful as it is always elevated in the setting of ESRD.   3. Atrial fib:  She has converted to NSR.  She will need coumadin therapy - not a good candidate for NOAC given her ESRD.   4. Hyperlipidemia:  Intolerant to Atorvastatin in the past .  Was on Zetia on admission    Ramond Dial., MD, [facility] 06/20/2014, 11:19 AM [address]  [place] Pager [phone]

## 2014-06-20 NOTE — Progress Notes (Signed)
[place] for heparin Indication: atrial fibrillation  Allergies  Allergen Reactions  . Lipitor [Atorvastatin] Other (See Comments)    Causes weakness and drowsiness  . Morphine And Related Nausea And Vomiting  . Nsaids Other (See Comments)    Unknown reported by previous hospital    Patient Measurements: Height: 5\' 4"  (162.6 cm) Weight: 155 lb 10.3 oz (70.6 kg) IBW/kg (Calculated) : 54.7 Heparin Dosing Weight: 67 kg  Vital Signs: Temp: 97.8 F (36.6 C) (09/22 1211) Temp src: Oral (09/22 1211) BP: 111/41 mmHg (09/22 1500) Pulse Rate: 76 (09/22 1500)  Labs:  Recent Labs  06/18/14 1350 06/18/14 1735  06/18/14 1755 06/18/14 2316 06/19/14 0815 06/20/14 0221 06/20/14 0837 06/20/14 1155 06/20/14 1520  HGB  --   --   < > 10.5*  --  11.1* 10.6*  --   --   --   HCT  --   --   --  36.0  --  38.9 36.8  --   --   --   PLT  --   --   --  135*  --  150 141*  --   --   --   HEPARINUNFRC  --   --   --   --   --   --  0.38  --  0.23* 0.21*  CREATININE  --   --   --  2.99*  --  4.09*  --  3.18*  --   --   TROPONINI <0.30 <0.30  --   --  <0.30  --   --   --   --   --   < > = values in this interval not displayed.  Estimated Creatinine Clearance: 15.4 ml/min (by C-G formula based on Cr of 3.18).  Assessment: 73 yo female on IV heparin for atrial fibrillation. Hgb 10.6, Platelets 141 - stable. No bleeding noted.   Heparin level ( 2hr post-HD) is low at 0.21 on rate of 900 units/hr.   Goal of Therapy:  Heparin level 0.3-0.7 units/ml Monitor platelets by anticoagulation protocol: Yes   Plan:  Increase rate to 1100 units/hr.  Recheck level in 8 hours.  Daily heparin level and CBC while on therapy.   Sloan Leiter, PharmD, BCPS Clinical Pharmacist [phone]  06/20/2014 4:09 PM

## 2014-06-20 NOTE — Progress Notes (Signed)
PULMONARY / CRITICAL CARE MEDICINE   Name: AHMIYA ABEE MRN: [id number] DOB: Mar 15, 1941    ADMISSION DATE:  06/18/2014  REFERRING MD :  [facility]   CHIEF COMPLAINT:  SOB  INITIAL PRESENTATION: [name] [facility] with complaints of SOB.  Work up found the patient to have pulmonary edema on CXR & hypercarbic resp failure.  Tx to [facility] for evaluation for HD and further care.   STUDIES:  9/20 ECHO > LVEF 40%, wall motion abnormalities, left atrium dilated, RV function mildly reduced   SIGNIFICANT EVENTS: 9/20  Admit with volume overload / pulmonary edema 9/21 HD, Afib RVR, cardiology consult   SUBJECTIVE: Pt reports feeling better, afib with rvr yesterday  VITAL SIGNS: Temp:  [97.3 F (36.3 C)-98.5 F (36.9 C)] 97.8 F (36.6 C) (09/22 1211) Pulse Rate:  [47-129] 81 (09/22 1315) Resp:  [15-32] 27 (09/22 1315) BP: (64-160)/(21-119) 129/43 mmHg (09/22 1315) SpO2:  [85 %-100 %] 97 % (09/22 1315) Weight:  [67.4 kg (148 lb 9.4 oz)-70.6 kg (155 lb 10.3 oz)] 70.6 kg (155 lb 10.3 oz) (09/22 0736)  HEMODYNAMICS:    VENTILATOR SETTINGS:    INTAKE / OUTPUT:  Intake/Output Summary (Last 24 hours) at 06/20/14 1406 Last data filed at 06/20/14 1315  Gross per 24 hour  Intake 2295.25 ml  Output   5561 ml  Net -3265.75 ml    PHYSICAL EXAMINATION: Gen: awake, alert, feels well HEENT: NCAT, EOMi PULM: CTA B CV: ireg irreg, slight murmur AB: BS+, soft, nontender Ext: warm, no edema Neuro: A&Ox4, maew  LABS:  CBC  Recent Labs Lab 06/18/14 1755 06/19/14 0815 06/20/14 0221  WBC 7.5 7.6 9.0  HGB 10.5* 11.1* 10.6*  HCT 36.0 38.9 36.8  PLT 135* 150 141*   Coag's No results found for this basename: APTT, INR,  in the last 168 hours BMET  Recent Labs Lab 06/18/14 1755 06/19/14 0815 06/20/14 0837  NA 143 143 141  K 3.8 4.6 3.5*  CL 101 102 100  CO2 30 29 27   BUN 11 15 13   CREATININE 2.99* 4.09* 3.18*  GLUCOSE 101* 98 96    Electrolytes  Recent Labs Lab 06/18/14 1755 06/19/14 0815 06/20/14 0837  CALCIUM 8.6 8.7 8.3*  MG  --  2.0  --   PHOS 3.7 5.8* 4.4   Sepsis Markers No results found for this basename: LATICACIDVEN, PROCALCITON, O2SATVEN,  in the last 168 hours  ABG No results found for this basename: PHART, PCO2ART, PO2ART,  in the last 168 hours  Liver Enzymes  Recent Labs Lab 06/18/14 1755 06/19/14 0815 06/20/14 0837  ALBUMIN 3.2* 3.1* 2.8*   Cardiac Enzymes  Recent Labs Lab 06/18/14 1350 06/18/14 1735 06/18/14 2316  TROPONINI <0.30 <0.30 <0.30   Glucose  Recent Labs Lab 06/19/14 1622 06/19/14 1932 06/20/14 0023 06/20/14 0347 06/20/14 0717 06/20/14 1247  GLUCAP 138* 116* 139* 115* 90 109*    Imaging Dg Chest Port 1 View  06/19/2014   CLINICAL DATA:  Followup pulmonary edema.  EXAM: PORTABLE CHEST - 1 VIEW  COMPARISON:  Yesterday.  FINDINGS: The cardiac silhouette remains borderline enlarged. Mild increase in prominence of the pulmonary vasculature and interstitial markings with Kerley lines. Mildly increased airspace opacity on the left and mildly improved airspace opacity on the right. Small bilateral pleural effusions.  IMPRESSION: Overall little change in changes of congestive heart failure.   Electronically Signed   By: Enrique Sack M.D.   On: 06/19/2014  07:41     ASSESSMENT / PLAN:  PULMONARY OETT n/a A: Acute Respiratory Failure > in setting of volume overload Pulmonary Edema > resolved post extubation Pleural Effusions  > related to CHF COPD - baseline 3L O2 dependent OSA Pulmonary HTN P:   Oxygen to support sats 90-95%  Pulmonary hygiene: flutter valve, mobilize Scheduled duonebs  Hold home spiriva for now  CARDIOVASCULAR CVL n/a A:  AFib - back in RVR 9/21, better on amiodarone HTN HLD Acute decompensated systolic heart failure: Echo 9/20> new depressed LVEF compared to prior  P:  Per cardiology Cardiology plans LHC/RHC Amiodarone gtt and  heparin per pharm Continue ASA tele  RENAL A:   CKD / ESRD - on HD.  AVF.  Last HD on 9/18 Volume Overload > improved P:   Appreciate Nephrology consult Monitor BMP  GASTROINTESTINAL A:   Diarrhea - Cdiff neg as of 9/8 > resolving P:   See ID Hold PPI given recent c-diff infection  Probiotics Hold home reglan in setting of diarrhea   HEMATOLOGIC A:   Anemia  P:  Monitor CBC Heparin for DVT prophylaxis   INFECTIOUS A:   Recent HCAP & C-Diff Colitis - completed Rx for both Diarrhea - ongoing, C-diff negative as of 9/8 Colovaginal fistula P:   Hold abx for now Monitor fever curve / leukocytosis No evidence of acute infection at this time  ENDOCRINE A:  DM P:   SSI  Hold home glipizide   NEUROLOGIC A:   No acute issues P:   RASS goal: n/a Monitor, no acute issues  TODAY'S SUMMARY: Improving with amiodarone and another round of HD.  Will need LHC and RHC.    Transfer to [place] service, PCCM off   Roselie Awkward, MD [place] PCCM Pager: [phone] Cell: [phone] If no response, call [phone]

## 2014-06-21 ENCOUNTER — Encounter (HOSPITAL_COMMUNITY)
Admission: AD | Disposition: A | Discharge status: Home Health | Payer: Self-pay | Source: Other Acute Inpatient Hospital | Attending: Internal Medicine

## 2014-06-21 DIAGNOSIS — I739 Peripheral vascular disease, unspecified: Secondary | ICD-10-CM

## 2014-06-21 DIAGNOSIS — I428 Other cardiomyopathies: Secondary | ICD-10-CM

## 2014-06-21 DIAGNOSIS — N321 Vesicointestinal fistula: Secondary | ICD-10-CM

## 2014-06-21 DIAGNOSIS — N189 Chronic kidney disease, unspecified: Secondary | ICD-10-CM

## 2014-06-21 DIAGNOSIS — I209 Angina pectoris, unspecified: Secondary | ICD-10-CM

## 2014-06-21 DIAGNOSIS — I2510 Atherosclerotic heart disease of native coronary artery without angina pectoris: Secondary | ICD-10-CM

## 2014-06-21 DIAGNOSIS — Z992 Dependence on renal dialysis: Secondary | ICD-10-CM

## 2014-06-21 DIAGNOSIS — N039 Chronic nephritic syndrome with unspecified morphologic changes: Secondary | ICD-10-CM

## 2014-06-21 DIAGNOSIS — D631 Anemia in chronic kidney disease: Secondary | ICD-10-CM

## 2014-06-21 HISTORY — PX: LEFT HEART CATHETERIZATION WITH CORONARY ANGIOGRAM: SHX5451

## 2014-06-21 LAB — PROTIME-INR
INR: 1.1 (ref 0.00–1.49)
PROTHROMBIN TIME: 14.2 s (ref 11.6–15.2)

## 2014-06-21 LAB — BASIC METABOLIC PANEL
Anion gap: 10 (ref 5–15)
BUN: 13 mg/dL (ref 6–23)
CO2: 30 mEq/L (ref 19–32)
CREATININE: 3.31 mg/dL — AB (ref 0.50–1.10)
Calcium: 8.6 mg/dL (ref 8.4–10.5)
Chloride: 101 mEq/L (ref 96–112)
GFR calc non Af Amer: 13 mL/min — ABNORMAL LOW (ref >90)
GFR, EST AFRICAN AMERICAN: 15 mL/min — AB (ref >90)
Glucose, Bld: 99 mg/dL (ref 70–99)
Potassium: 4.1 mEq/L (ref 3.7–5.3)
Sodium: 141 mEq/L (ref 137–147)

## 2014-06-21 LAB — CBC
HCT: 39.1 % (ref 36.0–46.0)
Hemoglobin: 11.2 g/dL — ABNORMAL LOW (ref 12.0–15.0)
MCH: 29 pg (ref 26.0–34.0)
MCHC: 28.6 g/dL — ABNORMAL LOW (ref 30.0–36.0)
MCV: 101.3 fL — ABNORMAL HIGH (ref 78.0–100.0)
Platelets: 120 10*3/uL — ABNORMAL LOW (ref 150–400)
RBC: 3.86 MIL/uL — AB (ref 3.87–5.11)
RDW: 19.2 % — AB (ref 11.5–15.5)
WBC: 7.8 10*3/uL (ref 4.0–10.5)

## 2014-06-21 LAB — HEPARIN LEVEL (UNFRACTIONATED)
HEPARIN UNFRACTIONATED: 0.36 [IU]/mL (ref 0.30–0.70)
HEPARIN UNFRACTIONATED: 0.25 [IU]/mL — AB (ref 0.30–0.70)

## 2014-06-21 LAB — POCT I-STAT 3, ART BLOOD GAS (G3+)
Acid-base deficit: 1 mmol/L (ref 0.0–2.0)
Acid-base deficit: 1 mmol/L (ref 0.0–2.0)
BICARBONATE: 27 meq/L — AB (ref 20.0–24.0)
Bicarbonate: 26.3 mEq/L — ABNORMAL HIGH (ref 20.0–24.0)
O2 SAT: 70 %
O2 Saturation: 94 %
PCO2 ART: 54.2 mmHg — AB (ref 35.0–45.0)
PH ART: 7.294 — AB (ref 7.350–7.450)
PO2 ART: 43 mmHg — AB (ref 80.0–100.0)
TCO2: 28 mmol/L (ref 0–100)
TCO2: 29 mmol/L (ref 0–100)
pCO2 arterial: 60.1 mmHg (ref 35.0–45.0)
pH, Arterial: 7.26 — ABNORMAL LOW (ref 7.350–7.450)
pO2, Arterial: 80 mmHg (ref 80.0–100.0)

## 2014-06-21 LAB — POCT ACTIVATED CLOTTING TIME: Activated Clotting Time: 129 seconds

## 2014-06-21 LAB — GLUCOSE, CAPILLARY
GLUCOSE-CAPILLARY: 89 mg/dL (ref 70–99)
GLUCOSE-CAPILLARY: 82 mg/dL (ref 70–99)
GLUCOSE-CAPILLARY: 79 mg/dL (ref 70–99)
GLUCOSE-CAPILLARY: 120 mg/dL — AB (ref 70–99)
Glucose-Capillary: 82 mg/dL (ref 70–99)

## 2014-06-21 SURGERY — LEFT HEART CATHETERIZATION WITH CORONARY ANGIOGRAM
Anesthesia: LOCAL

## 2014-06-21 MED ORDER — LIDOCAINE-PRILOCAINE 2.5-2.5 % EX CREA: 1.0000 "application " | TOPICAL_CREAM | CUTANEOUS | Status: DC | PRN

## 2014-06-21 MED ORDER — IPRATROPIUM-ALBUTEROL 0.5-2.5 (3) MG/3ML IN SOLN
3.0000 mL | Freq: Three times a day (TID) | RESPIRATORY_TRACT | Status: DC
  Administered 2014-06-21 – 2014-06-24 (×6): 3 mL via RESPIRATORY_TRACT
  Filled 2014-06-21 (×7): qty 3

## 2014-06-21 MED ORDER — SODIUM CHLORIDE 0.9 % IV SOLN: 250.0000 mL | INTRAVENOUS | Status: DC | PRN

## 2014-06-21 MED ORDER — HEPARIN SODIUM (PORCINE) 1000 UNIT/ML DIALYSIS
1000.0000 [IU] | INTRAMUSCULAR | Status: DC | PRN
Start: 2014-06-21 — End: 2014-06-21

## 2014-06-21 MED ORDER — ALTEPLASE 2 MG IJ SOLR
2.0000 mg | Freq: Once | INTRAMUSCULAR | Status: AC | PRN
  Filled 2014-06-21: qty 2

## 2014-06-21 MED ORDER — SODIUM CHLORIDE 0.9 % IJ SOLN: 3.0000 mL | INTRAMUSCULAR | Status: DC | PRN

## 2014-06-21 MED ORDER — LIDOCAINE HCL (PF) 1 % IJ SOLN
INTRAMUSCULAR | Status: AC
  Filled 2014-06-21: qty 30

## 2014-06-21 MED ORDER — HEPARIN SODIUM (PORCINE) 1000 UNIT/ML DIALYSIS
2000.0000 [IU] | Freq: Once | INTRAMUSCULAR | Status: DC
  Filled 2014-06-21: qty 2

## 2014-06-21 MED ORDER — SODIUM CHLORIDE 0.9 % IV SOLN
100.0000 mL | INTRAVENOUS | Status: DC | PRN
Start: 2014-06-21 — End: 2014-06-22

## 2014-06-21 MED ORDER — NEPRO/CARBSTEADY PO LIQD: 237.0000 mL | ORAL | Status: DC | PRN

## 2014-06-21 MED ORDER — HEPARIN (PORCINE) IN NACL 100-0.45 UNIT/ML-% IJ SOLN
1350.0000 [IU]/h | INTRAMUSCULAR | Status: DC
  Administered 2014-06-21: 1200 [IU]/h via INTRAVENOUS
  Administered 2014-06-22: 1350 [IU]/h via INTRAVENOUS
  Administered 2014-06-22: 1200 [IU]/h via INTRAVENOUS
  Filled 2014-06-21 (×2): qty 250

## 2014-06-21 MED ORDER — FENTANYL CITRATE 0.05 MG/ML IJ SOLN
INTRAMUSCULAR | Status: AC
  Filled 2014-06-21: qty 2

## 2014-06-21 MED ORDER — SODIUM CHLORIDE 0.9 % IJ SOLN
3.0000 mL | Freq: Two times a day (BID) | INTRAMUSCULAR | Status: DC
  Administered 2014-06-22 – 2014-06-23 (×4): 3 mL via INTRAVENOUS

## 2014-06-21 MED ORDER — NITROGLYCERIN 1 MG/10 ML FOR IR/CATH LAB
INTRA_ARTERIAL | Status: AC
  Filled 2014-06-21: qty 10

## 2014-06-21 MED ORDER — PENTAFLUOROPROP-TETRAFLUOROETH EX AERO: 1.0000 "application " | INHALATION_SPRAY | CUTANEOUS | Status: DC | PRN

## 2014-06-21 MED ORDER — HEPARIN (PORCINE) IN NACL 2-0.9 UNIT/ML-% IJ SOLN
INTRAMUSCULAR | Status: AC
  Filled 2014-06-21: qty 1000

## 2014-06-21 MED ORDER — SODIUM CHLORIDE 0.9 % IV SOLN: 100.0000 mL | INTRAVENOUS | Status: DC | PRN

## 2014-06-21 MED ORDER — LIDOCAINE HCL (PF) 1 % IJ SOLN: 5.0000 mL | INTRAMUSCULAR | Status: DC | PRN

## 2014-06-21 MED ORDER — MIDAZOLAM HCL 2 MG/2ML IJ SOLN
INTRAMUSCULAR | Status: AC
  Filled 2014-06-21: qty 2

## 2014-06-21 MED ORDER — AMIODARONE HCL 200 MG PO TABS
400.0000 mg | ORAL_TABLET | Freq: Two times a day (BID) | ORAL | Status: DC
  Administered 2014-06-21 – 2014-06-22 (×4): 400 mg via ORAL
  Filled 2014-06-21 (×6): qty 2

## 2014-06-21 MED ORDER — ACETAMINOPHEN 325 MG PO TABS
650.0000 mg | ORAL_TABLET | ORAL | Status: DC | PRN
  Administered 2014-06-22 – 2014-06-23 (×2): 650 mg via ORAL
  Filled 2014-06-21: qty 2

## 2014-06-21 MED ORDER — ONDANSETRON HCL 4 MG/2ML IJ SOLN: 4.0000 mg | Freq: Four times a day (QID) | INTRAMUSCULAR | Status: DC | PRN

## 2014-06-21 NOTE — Progress Notes (Signed)
[place] for heparin Indication: atrial fibrillation  Allergies  Allergen Reactions  . Lipitor [Atorvastatin] Other (See Comments)    Causes weakness and drowsiness  . Morphine And Related Nausea And Vomiting  . Nsaids Other (See Comments)    Unknown reported by previous hospital    Patient Measurements: Height: 5\' 4"  (162.6 cm) Weight: 156 lb 15.5 oz (71.2 kg) IBW/kg (Calculated) : 54.7 Heparin Dosing Weight: 67 kg  Vital Signs: Temp: 98.2 F (36.8 C) (09/23 0424) Temp src: Oral (09/23 0424) BP: 144/41 mmHg (09/23 0424) Pulse Rate: 77 (09/23 0424)  Labs:  Recent Labs  06/18/14 1350 06/18/14 1735  06/18/14 2316 06/19/14 0815 06/20/14 0221 06/20/14 0837  06/20/14 1520 06/21/14 0230 06/21/14 1200  HGB  --   --   < >  --  11.1* 10.6*  --   --   --  11.2*  --   HCT  --   --   < >  --  38.9 36.8  --   --   --  39.1  --   PLT  --   --   < >  --  150 141*  --   --   --  120*  --   LABPROT  --   --   --   --   --   --   --   --   --  14.2  --   INR  --   --   --   --   --   --   --   --   --  1.10  --   HEPARINUNFRC  --   --   --   --   --  0.38  --   < > 0.21* 0.36 0.25*  CREATININE  --   --   < >  --  4.09*  --  3.18*  --   --  3.31*  --   TROPONINI <0.30 <0.30  --  <0.30  --   --   --   --   --   --   --   < > = values in this interval not displayed.  Estimated Creatinine Clearance: 14.9 ml/min (by C-G formula based on Cr of 3.31).  Assessment: 73 yo female on IV heparin for atrial fibrillation. Level was good this morning, but a confirmatory heparin level early this afternoon fell to below therapeutic range. Patient has already been take to cath lab. CBC stable, no bleeding noted.  Goal of Therapy:  Heparin level 0.3-0.7 units/ml Monitor platelets by anticoagulation protocol: Yes   Plan:  1. Follow for anticoagulation plans post-cath  Stace Peace D. Keelynn Furgerson, PharmD, BCPS Clinical Pharmacist Pager: [phone] 06/21/2014 1:45  PM

## 2014-06-21 NOTE — Progress Notes (Signed)
TELEMETRY: Reviewed telemetry pt in NSR: Filed Vitals:   06/20/14 2121 06/21/14 0032 06/21/14 0304 06/21/14 0424  BP:    144/41  Pulse:    77  Temp:    98.2 F (36.8 C)  TempSrc:    Oral  Resp:    20  Height:      Weight:    156 lb 15.5 oz (71.2 kg)  SpO2: 96% 98% 95% 95%    Intake/Output Summary (Last 24 hours) at 06/21/14 0908 Last data filed at 06/20/14 1801  Gross per 24 hour  Intake  734.2 ml  Output   1562 ml  Net -827.8 ml   Filed Weights   06/20/14 0600 06/20/14 0736 06/21/14 0424  Weight: 151 lb 3.8 oz (68.6 kg) 155 lb 10.3 oz (70.6 kg) 156 lb 15.5 oz (71.2 kg)    Subjective Feels much better. Denies SOB or CP currently. Able to lie supine.  Marland Kitchen amiodarone  400 mg Oral BID  . aspirin EC  81 mg Oral Daily  . calcitRIOL  0.25 mcg Oral Q M,W,F-HD  . calcium acetate  2,001 mg Oral TID WC  . darbepoetin (ARANESP) injection - DIALYSIS  60 mcg Intravenous Q Wed-HD  . insulin aspart  0-9 Units Subcutaneous TID WC  . ipratropium-albuterol  3 mL Nebulization Q4H  . rOPINIRole  1 mg Oral BID AC & HS  . saccharomyces boulardii  250 mg Oral BID  . sodium chloride  3 mL Intravenous Q12H  . tuberculin  5 Units Intradermal Once   . sodium chloride 10 mL/hr at 06/19/14 2000  . heparin 1,100 Units/hr (06/21/14 0023)    LABS: Basic Metabolic Panel:  Recent Labs  06/19/14 0815 06/20/14 0837 06/21/14 0230  NA 143 141 141  K 4.6 3.5* 4.1  CL 102 100 101  CO2 29 27 30   GLUCOSE 98 96 99  BUN 15 13 13   CREATININE 4.09* 3.18* 3.31*  CALCIUM 8.7 8.3* 8.6  MG 2.0  --   --   PHOS 5.8* 4.4  --    Liver Function Tests:  Recent Labs  06/19/14 0815 06/20/14 0837  ALBUMIN 3.1* 2.8*   No results found for this basename: LIPASE, AMYLASE,  in the last 72 hours CBC:  Recent Labs  06/20/14 0221 06/21/14 0230  WBC 9.0 7.8  HGB 10.6* 11.2*  HCT 36.8 39.1  MCV 101.7* 101.3*  PLT 141* 120*   Cardiac Enzymes:  Recent Labs  06/18/14 1350 06/18/14 1735  06/18/14 2316  TROPONINI <0.30 <0.30 <0.30   BNP: No results found for this basename: PROBNP,  in the last 72 hours D-Dimer: No results found for this basename: DDIMER,  in the last 72 hours Hemoglobin A1C: No results found for this basename: HGBA1C,  in the last 72 hours Fasting Lipid Panel: No results found for this basename: CHOL, HDL, LDLCALC, TRIG, CHOLHDL, LDLDIRECT,  in the last 72 hours Thyroid Function Tests: No results found for this basename: TSH, T4TOTAL, FREET3, T3FREE, THYROIDAB,  in the last 72 hours   Radiology/Studies:  Dg Chest Port 1 View  06/20/2014   CLINICAL DATA:  Followup CHF versus fluid overload.  EXAM: PORTABLE CHEST - 1 VIEW  COMPARISON:  Portable chest x-rays yesterday and dating back to 06/08/2014.  FINDINGS: Interval near complete resolution of the previously identified interstitial and airspace pulmonary edema since yesterday. Stable small bilateral pleural effusions, left greater than right, at with improved aeration in the lung bases. No new pulmonary parenchymal  abnormalities. Cardiac silhouette moderately enlarged but stable.  IMPRESSION: Near complete resolution of CHF, with only mild interstitial pulmonary edema persisting. Stable bilateral pleural effusions, left greater than right. Improved aeration in the lung bases, with mild atelectasis persisting. No new abnormalities.   Electronically Signed   By: Evangeline Dakin M.D.   On: 06/20/2014 15:47   Ecg: NSR with LBBB  Echo:Study Conclusions  - Procedure narrative: Transthoracic echocardiography. Image quality was adequate. The study was technically difficult. - Left ventricle: Hypokinesis of the inferior septum, inferior wall, and inferolateral wall. This is new since the study of 2014. The cavity size was normal. Wall thickness was increased in a pattern of moderate LVH. The estimated ejection fraction was 40%. - Aortic valve: Sclerosis without stenosis. There was trivial regurgitation. -  Mitral valve: Over time there has been some increase in the mitral inflow gradients. There may be mild functional mitral stenosis. There was mild regurgitation. - Left atrium: The atrium was moderately dilated. - Right ventricle: Systolic function was mildly reduced. - Impressions: Large pleural effusion.  Impressions:  - Large pleural effusion.   PHYSICAL EXAM General: Overweight,elderly WF in NAD Head: Normocephalic, atraumatic, sclera non-icteric, oropharynx is clear Neck: Negative for carotid bruits. JVD not elevated. No adenopathy Lungs: Clear bilaterally to auscultation without wheezes, rales, or rhonchi. Breathing is unlabored. Heart: RRR S1 S2 without murmurs, rubs, or gallops.  Abdomen: Soft, non-tender, non-distended with normoactive bowel sounds. No hepatomegaly. No rebound/guarding. No obvious abdominal masses. Extremities: No clubbing, cyanosis or edema.  2+ femoral pulses without bruit. 1+ DP pulse.  Neuro: Alert and oriented X 3. Moves all extremities spontaneously. Psych:  Responds to questions appropriately with a normal affect.  ASSESSMENT AND PLAN: 1. Acute respiratory failure with hypoxia. Clinically much improved with dialysis and correction of volume excess. CXR improved. Will not dialyze today pending results of right heart cath.  2. Acute combined systolic/diastolic CHF. LBBB. With wall motion abnormality concerned about ischemia. Remote PCI. Will proceed with right and left heart cath today. If stent required will need to decide BMS vs DES. Need for anticoagulation with coumadin would favor BMS but ESRD/DM is associated with high restenosis risk favoring DES. Will depend on anatomy. Consider adding Coreg depending on results of cath.   3. Atrial fibrillation. Converted to NSR on amiodarone. Will transition to PO today. 400 mg bid for one week then daily. Will need to consider anticoagulation with coumadin once invasive procedures complete.   4. ESRD on dialysis.    5. Hyperlipidemia. Statin intolerant in past. On Zetia.   6. S/p bilateral CEA.  7. HTN  8. CAD with remote PCI. ? Vessel. Patient told vessel was too small for stent and PTCA done.   9. DM  10. COPD oxygen dependent.  Present on Admission:  . Acute respiratory failure . CAD (coronary artery disease)  Signed, Cherylene Ferrufino [place], [place] 06/21/2014 9:08 AM

## 2014-06-21 NOTE — Progress Notes (Signed)
Subjective:  No complaints  Objective Vital signs in last 24 hours: Filed Vitals:   06/20/14 2121 06/21/14 0032 06/21/14 0304 06/21/14 0424  BP:    144/41  Pulse:    77  Temp:    98.2 F (36.8 C)  TempSrc:    Oral  Resp:    20  Height:      Weight:    71.2 kg (156 lb 15.5 oz)  SpO2: 96% 98% 95% 95%   Weight change: -1.3 kg (-2 lb 13.9 oz)  Physical Exam: General: alert WF NAD on HD pleasnt  Heart: RRR, no mur, rub or gallop Lungs: clear bilat no rales today Abdomen: soft , nt, nd Extremities:no pedal edema Dialysis Access: RUA AVF patent on hd    OP HD: MWF [facility]  4h 72kg 2K/2.25Ca 400/A1.5 P2 AVF RUA No Heparin  Calcitriol 0.25 mcg MWF Aranesp 60 mcg qwk No Venofer   Assessment: 1  Dyspnea / pulm edema - due to lean body wt loss, resolved 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  3  ESRD on HD   4  HTN no meds here or at home, BP's on the low side 5  Anemia cont aranesp 60 /wk  hgb 11 6  HPTH cont vit D, phoslo  7  COPD home O2, nebs    8  Afib converted to SR  on amio, on iv hep and  Coumadin per card   9  DM per primary 10 Cdiff infection has finished oral meds / last Neg  Plan - HD either today post -cath or tomorrow  Kelly Splinter MD (pgr) [phone]    ([phone] 06/21/2014, 9:20 AM   Labs: Basic Metabolic Panel:  Recent Labs Lab 06/18/14 1755 06/19/14 0815 06/20/14 0837 06/21/14 0230  NA 143 143 141 141  K 3.8 4.6 3.5* 4.1  CL 101 102 100 101  CO2 30 29 27 30   GLUCOSE 101* 98 96 99  BUN 11 15 13 13   CREATININE 2.99* 4.09* 3.18* 3.31*  CALCIUM 8.6 8.7 8.3* 8.6  PHOS 3.7 5.8* 4.4  --    Liver Function Tests:  Recent Labs Lab 06/18/14 1755 06/19/14 0815 06/20/14 0837  ALBUMIN 3.2* 3.1* 2.8*  CBC:  Recent Labs Lab 06/18/14 1755 06/19/14 0815 06/20/14 0221 06/21/14 0230  WBC 7.5 7.6 9.0 7.8  HGB 10.5* 11.1* 10.6* 11.2*  HCT 36.0 38.9 36.8 39.1  MCV 98.9 101.6* 101.7* 101.3*  PLT 135* 150 141* 120*   Cardiac  Enzymes:  Recent Labs Lab 06/18/14 1350 06/18/14 1735 06/18/14 2316  TROPONINI <0.30 <0.30 <0.30   CBG:  Recent Labs Lab 06/20/14 0717 06/20/14 1247 06/20/14 1612 06/20/14 2105 06/21/14 0611  GLUCAP 90 109* 118* 123* 89    Studies/Results: Dg Chest Port 1 View  06/20/2014   CLINICAL DATA:  Followup CHF versus fluid overload.  EXAM: PORTABLE CHEST - 1 VIEW  COMPARISON:  Portable chest x-rays yesterday and dating back to 06/08/2014.  FINDINGS: Interval near complete resolution of the previously identified interstitial and airspace pulmonary edema since yesterday. Stable small bilateral pleural effusions, left greater than right, at with improved aeration in the lung bases. No new pulmonary parenchymal abnormalities. Cardiac silhouette moderately enlarged but stable.  IMPRESSION: Near complete resolution of CHF, with only mild interstitial pulmonary edema persisting. Stable bilateral pleural effusions, left greater than right. Improved aeration in the lung bases, with mild atelectasis persisting. No new abnormalities.   Electronically Signed  By: Evangeline Dakin M.D.   On: 06/20/2014 15:47   Medications: . sodium chloride 10 mL/hr at 06/19/14 2000  . heparin 1,100 Units/hr (06/21/14 0023)   . amiodarone  400 mg Oral BID  . aspirin EC  81 mg Oral Daily  . calcitRIOL  0.25 mcg Oral Q M,W,F-HD  . calcium acetate  2,001 mg Oral TID WC  . darbepoetin (ARANESP) injection - DIALYSIS  60 mcg Intravenous Q Wed-HD  . insulin aspart  0-9 Units Subcutaneous TID WC  . ipratropium-albuterol  3 mL Nebulization Q4H  . rOPINIRole  1 mg Oral BID AC & HS  . saccharomyces boulardii  250 mg Oral BID  . sodium chloride  3 mL Intravenous Q12H  . tuberculin  5 Units Intradermal Once

## 2014-06-21 NOTE — CV Procedure (Addendum)
Left and Right Heart Catheterization with Coronary Angiography, Abdominal Aortography, and Right Femoral Angiography  Report  [name]  73 y.o.  female [birth year]  Procedure Date: 06/21/2014 Referring Physician: Mertie Moores, M.D. Primary Cardiologist: Fransico Him, M.D.  INDICATIONS: Chest discomfort in setting of atrial fibrillation with rapid ventricular response. Prior history of coronary stenting involving the LAD  PROCEDURE: 1. Left heart catheterization; 2. Coronary angiography; 3. Right heart catheterization; 4. Distal abdominal aortic aortography; 5. Right femoral angiography  CONSENT:  The risks, benefits, and details of the procedure were explained in detail to the patient. Risks including death, stroke, heart attack, kidney injury, allergy, limb ischemia, bleeding and radiation injury were discussed.  The patient verbalized understanding and wanted to proceed.  Informed written consent was obtained.  PROCEDURE TECHNIQUE:  After Xylocaine anesthesia a 5 French arterial sheath was placed in the right femoral artery using the modified Seldinger technique.  A 7 French sheath was placed in the right femoral vein using the modified Seldinger technique. Right heart catheterization was performed using a 7 French balloon tipped Swan-Ganz catheter. An oximetry sample was obtained in the main pulmonary artery. Heart catheterization was performed via the right femoral artery we could not advance the guidewire beyond the origin of the iliac. Multiple wires were used we will unable to cross the total occlusion. Angiography documented occlusion of the right iliac ostium. We then switched to the left femoral approach where a 5 French sheath was placed after local anesthesia. We will able to advance a guidewire with some difficulty into the descending aorta. Coronary angiography was then performed using a 5 [place] A2 multipurpose catheter. Hemodynamic recordings and a central aortic oximetry  sample was obtained with this cath. Left ventriculography was performed by hand injection using the multipurpose catheter.. We used a 5 French 4 cm Judkins right 4 right coronary angiography. There was mild catheter damping with engagement but reflux of contrast into the aorta during injection.    At the completion of angiography the digital images were reviewed and the case was terminated.   CONTRAST:  Total of 100 cc.  COMPLICATIONS:  None   HEMODYNAMICS:  Aortic pressure 150/54 mmHg; LV pressure 154/18 mmHg; LVEDP 17 mm mercury; RA 5 mm mercury; RV 49/10 mmHg; PA 49/22 mmHg; PCWP(mean) 19 mm mercury; Cardiac Output 6.4 L per minute by Fick and 5.15 L per minute by thermodilution; mean mitral valve gradient 8 mm mercury; mitral valve area 2.43 cm square; AV gradient 0  ANGIOGRAPHIC DATA:   The left main coronary artery is calcified but widely patent..  The left anterior descending artery is patent and contains a proximal stent. Proximal to the stent there is segmental 50% narrowing. There is ostial 40-50% LAD narrowing. The first diagonal arises from within the stented segment and contains ostial and proximal 80-90% obstruction.  The left circumflex artery is patent. The first obtuse marginal before bifurcating into 2 significant branches, contains segmental 50-70% narrowing. The mid circumflex contains 30 -50% narrowing before ending one to small to moderate sized distal obtuse marginal branches..  The right coronary artery is contains a heavily calcified ostium and right sinus of Valsalva. There is 50-70% ostial narrowing. Proximal to mid segmental stenosis between 50 and 70%. PDA is noted distally. No high-grade obstruction is noted in the RCA.  ABDOMINAL AORTOGRAPHY: Documented a total occlusion of the right iliac at the bifurcation. Significant distal aortic plaquing as noted above the bifurcation. There is up to 50% obstruction  of the left iliac. Both iliacs a heavily calcified.  RIGHT  ILIAC ANGIOGRAPHY: Totally occluded at its origin from the distal aorta. Heavy calcification is noted circumferentially in the right iliac.  LEFT VENTRICULOGRAM:  Left ventricular angiogram was done in the 30 RAO projection and revealed normal LV cavity size with asymmetric contractile pattern and an estimated ejection fraction of 40-50%. The mitral valve and annulus the heavily calcified. The aortic valve contains moderate calcification.   IMPRESSIONS:  1. Moderate coronary artery disease involving all 3 vessels including 50% proximal LAD, 50-70% first obtuse marginal, 70% ostial RCA, and 50-70% segmental proximal RCA. The stent in the proximal LAD is widely patent with 80% stenosis noted in the ostium and proximal portion of the jailed first diagonal.  2. Mildly depressed LV systolic function with an estimated ejection fraction of 40-50%.  3. 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  4. Distal abdominal aortic plaquing.  5. Mitral valve area 2.4 cm square.   6. Moderate pulmonary hypertension   RECOMMENDATION:  Risk factor modification. Dialysis to lower filling pressures. Beta blocker therapy as needed for functional mitral stenosis.

## 2014-06-21 NOTE — Progress Notes (Signed)
[place] for heparin Indication: atrial fibrillation  Allergies  Allergen Reactions  . Lipitor [Atorvastatin] Other (See Comments)    Causes weakness and drowsiness  . Morphine And Related Nausea And Vomiting  . Nsaids Other (See Comments)    Unknown reported by previous hospital    Patient Measurements: Height: 5\' 4"  (162.6 cm) Weight: 156 lb 15.5 oz (71.2 kg) IBW/kg (Calculated) : 54.7 Heparin Dosing Weight: 67 kg  Vital Signs: Temp: 98 F (36.7 C) (09/23 1821) Temp src: Oral (09/23 1821) BP: 171/58 mmHg (09/23 1821) Pulse Rate: 77 (09/23 1821)  Labs:  Recent Labs  06/18/14 2316  06/19/14 0815 06/20/14 0221 06/20/14 0837  06/20/14 1520 06/21/14 0230 06/21/14 1200  HGB  --   < > 11.1* 10.6*  --   --   --  11.2*  --   HCT  --   --  38.9 36.8  --   --   --  39.1  --   PLT  --   --  150 141*  --   --   --  120*  --   LABPROT  --   --   --   --   --   --   --  14.2  --   INR  --   --   --   --   --   --   --  1.10  --   HEPARINUNFRC  --   --   --  0.38  --   < > 0.21* 0.36 0.25*  CREATININE  --   --  4.09*  --  3.18*  --   --  3.31*  --   TROPONINI <0.30  --   --   --   --   --   --   --   --   < > = values in this interval not displayed.  Estimated Creatinine Clearance: 14.9 ml/min (by C-G formula based on Cr of 3.31).  Assessment: 73 yo female on IV heparin for atrial fibrillation. 9/23 Cath: Moderate 3VCAD, EF 40-50%. Plan to resume heparin 8 hrs post sheath removal (1800).  Goal of Therapy:  Heparin level 0.3-0.7 units/ml Monitor platelets by anticoagulation protocol: Yes   Plan:  At 0200 on 9/24, resume IV heparin at 1200 units/hr Will check heparin level 6 hrs after and daily.  Toree Edling S. Alford Highland, PharmD, [facility] Clinical Staff Pharmacist Pager [phone]  06/21/2014 6:24 PM

## 2014-06-21 NOTE — H&P (Signed)
The patient's chart reviewed. She presented with acute on chronic systolic and diastolic heart failure. She also COPD. She was long-standing diabetic with a history of end-stage renal disease on dialysis. She has been treated with dialysis and is now relatively stable. Catheterization has been arranged by the treating physicians to exclude coronary artery disease and myocardial ischemia as a cause of acute heart failure. Markers are negative. She has had occasional episodes of chest tightness. There is a history of prior catheterization many years ago when she underwent PCI but is uncertain if she had a stent. LVEF is 40% is swollen up to the information obtained this admission.  The procedure including left or right heart catheterization with coronary angiography was discussed with the patient. Risks of stroke, death, myocardial infarction, death, limb ischemia, and bleeding was discussed with the patient and accepted. She has had previous catheterization. She has agreed to proceeding to obtain this information.

## 2014-06-21 NOTE — Progress Notes (Signed)
[place] for heparin Indication: atrial fibrillation  Allergies  Allergen Reactions  . Lipitor [Atorvastatin] Other (See Comments)    Causes weakness and drowsiness  . Morphine And Related Nausea And Vomiting  . Nsaids Other (See Comments)    Unknown reported by previous hospital    Patient Measurements: Height: 5\' 4"  (162.6 cm) Weight: 155 lb 10.3 oz (70.6 kg) IBW/kg (Calculated) : 54.7 Heparin Dosing Weight: 67 kg  Vital Signs: Temp: 98.4 F (36.9 C) (09/22 2058) Temp src: Oral (09/22 2058) BP: 113/38 mmHg (09/22 2058) Pulse Rate: 84 (09/22 2058)  Labs:  Recent Labs  06/18/14 1350 06/18/14 1735  06/18/14 1755 06/18/14 2316 06/19/14 0815  06/20/14 0221 06/20/14 0837 06/20/14 1155 06/20/14 1520 06/21/14 0230  HGB  --   --   < > 10.5*  --  11.1*  --  10.6*  --   --   --  11.2*  HCT  --   --   < > 36.0  --  38.9  --  36.8  --   --   --  39.1  PLT  --   --   < > 135*  --  150  --  141*  --   --   --  120*  LABPROT  --   --   --   --   --   --   --   --   --   --   --  14.2  INR  --   --   --   --   --   --   --   --   --   --   --  1.10  HEPARINUNFRC  --   --   --   --   --   --   < > 0.38  --  0.23* 0.21* 0.36  CREATININE  --   --   --  2.99*  --  4.09*  --   --  3.18*  --   --   --   TROPONINI <0.30 <0.30  --   --  <0.30  --   --   --   --   --   --   --   < > = values in this interval not displayed.  Estimated Creatinine Clearance: 15.4 ml/min (by C-G formula based on Cr of 3.18).  Assessment: 73 yo female with Afib for heparin   Goal of Therapy:  Heparin level 0.3-0.7 units/ml Monitor platelets by anticoagulation protocol: Yes   Plan:  Continue Heparin at current rate   Phillis Knack, PharmD, BCPS   06/21/2014 3:17 AM

## 2014-06-21 NOTE — Progress Notes (Signed)
[facility] PROGRESS NOTE  [name] NID:[id number] DOB: [birth year] DOA: 06/18/2014 PCP: Gilford Rile, MD  PCCM transfer 9/23  Assessment/Plan: 1. Pulm Edema -improved with extra HD -Volume overload and new wall motion abnormalities on ECHO, EF 40% -Cards following -for R and LHC today, has h/o CAD and remote PCI  2. AFib -in NSR now, rate controlled, on IV heparin per Cards -on amiodarone -Coumadin is only option for anticoagulation given ESRD, but also supposed to have Surgery on RV fistula   3. ESRD -HD per Renal  4. Recent Cdiff -completed Vanc course, last PCR negative on 9/8  5. 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 Anemia of chronic disease -Aranesp with HD  7. Colo-Vaginal Fistula -seen by [facility] >1 week ago during last admission -Supposed to FU with Dr.ALicia Marcello Moores 1/44  8. DM -continue SSI   Code Status: Full Code Family Communication: none at bedside Disposition Plan: Home when stable   Consultants:  Cards  Renal  HPI/Subjective: Feels ok, breathing almost at baseline  Objective: Filed Vitals:   06/21/14 0424  BP: 144/41  Pulse: 77  Temp: 98.2 F (36.8 C)  Resp: 20    Intake/Output Summary (Last 24 hours) at 06/21/14 3154 Last data filed at 06/20/14 1801  Gross per 24 hour  Intake  769.9 ml  Output   1562 ml  Net -792.1 ml   Filed Weights   06/20/14 0600 06/20/14 0736 06/21/14 0424  Weight: 68.6 kg (151 lb 3.8 oz) 70.6 kg (155 lb 10.3 oz) 71.2 kg (156 lb 15.5 oz)    Exam:   General:  AAOx3, no distress  Cardiovascular: S1S2/RRR  Respiratory: diminished BS at bases  Abdomen: soft, Nt, BS present  Musculoskeletal: no edema c/c  Data Reviewed: Basic Metabolic Panel:  Recent Labs Lab 06/18/14 1755 06/19/14 0815 06/20/14 0837 06/21/14 0230  NA 143 143 141 141  K 3.8 4.6 3.5* 4.1  CL 101 102 100 101  CO2 30 29 27 30   GLUCOSE 101* 98 96 99  BUN 11 15 13 13   CREATININE 2.99* 4.09* 3.18* 3.31*   CALCIUM 8.6 8.7 8.3* 8.6  MG  --  2.0  --   --   PHOS 3.7 5.8* 4.4  --    Liver Function Tests:  Recent Labs Lab 06/18/14 1755 06/19/14 0815 06/20/14 0837  ALBUMIN 3.2* 3.1* 2.8*   No results found for this basename: LIPASE, AMYLASE,  in the last 168 hours No results found for this basename: AMMONIA,  in the last 168 hours CBC:  Recent Labs Lab 06/18/14 1755 06/19/14 0815 06/20/14 0221 06/21/14 0230  WBC 7.5 7.6 9.0 7.8  HGB 10.5* 11.1* 10.6* 11.2*  HCT 36.0 38.9 36.8 39.1  MCV 98.9 101.6* 101.7* 101.3*  PLT 135* 150 141* 120*   Cardiac Enzymes:  Recent Labs Lab 06/18/14 1350 06/18/14 1735 06/18/14 2316  TROPONINI <0.30 <0.30 <0.30   BNP (last 3 results)  Recent Labs  08/27/13 2220 04/19/14 0410 06/06/14 1620  PROBNP 14565.0* 30133.0* >70000.0*   CBG:  Recent Labs Lab 06/20/14 0717 06/20/14 1247 06/20/14 1612 06/20/14 2105 06/21/14 0611  GLUCAP 90 109* 118* 123* 89    No results found for this or any previous visit (from the past 240 hour(s)).   Studies: Dg Chest Port 1 View  06/20/2014   CLINICAL DATA:  Followup CHF versus fluid overload.  EXAM: PORTABLE CHEST - 1 VIEW  COMPARISON:  Portable chest x-rays yesterday and dating back to  06/08/2014.  FINDINGS: Interval near complete resolution of the previously identified interstitial and airspace pulmonary edema since yesterday. Stable small bilateral pleural effusions, left greater than right, at with improved aeration in the lung bases. No new pulmonary parenchymal abnormalities. Cardiac silhouette moderately enlarged but stable.  IMPRESSION: Near complete resolution of CHF, with only mild interstitial pulmonary edema persisting. Stable bilateral pleural effusions, left greater than right. Improved aeration in the lung bases, with mild atelectasis persisting. No new abnormalities.   Electronically Signed   By: Evangeline Dakin M.D.   On: 06/20/2014 15:47    Scheduled Meds: . amiodarone  150 mg  Intravenous Once  . aspirin EC  81 mg Oral Daily  . calcitRIOL  0.25 mcg Oral Q M,W,F-HD  . calcium acetate  2,001 mg Oral TID WC  . darbepoetin (ARANESP) injection - DIALYSIS  60 mcg Intravenous Q Wed-HD  . insulin aspart  0-9 Units Subcutaneous TID WC  . ipratropium-albuterol  3 mL Nebulization Q4H  . rOPINIRole  1 mg Oral BID AC & HS  . saccharomyces boulardii  250 mg Oral BID  . sodium chloride  3 mL Intravenous Q12H  . tuberculin  5 Units Intradermal Once   Continuous Infusions: . sodium chloride 10 mL/hr at 06/19/14 2000  . amiodarone 30 mg/hr (06/21/14 0609)  . heparin 1,100 Units/hr (06/21/14 0023)   Antibiotics Given (last 72 hours)   None      Active Problems:   Acute respiratory failure   Atrial fibrillation with RVR   Hypotension arterial   CAD (coronary artery disease)   DCM (dilated cardiomyopathy)    Time spent: 39min    [place] [facility] Pager [phone]. If 7PM-7AM, please contact night-coverage at [email or web address], password [facility] 06/21/2014, 8:22 AM  LOS: 3 days

## 2014-06-21 NOTE — Progress Notes (Signed)
Site area: Right groin removed a 31fr arterial , and a 81fr venous sheaths  Site Prior to Removal:  Level 0  Pressure Applied For 20 MINUTES    Minutes Beginning at 25 min  Manual:   Yes.    Patient Status During Pull:  stable  Post Pull Groin Site:  Level 0  Post Pull Instructions Given:  Yes.    Post Pull Pulses Present:  Yes.    Dressing Applied:  Yes.    Comments:  Vs remain stable during sheath pull of right groin.  Pt denies any discomfort at this site.

## 2014-06-21 NOTE — Progress Notes (Signed)
Site area: Left groin removed a 5 fr  Arterial sheath  Site Prior to Removal:  Level 0  Pressure Applied For 20 MINUTES    Minutes Beginning at 1735  Manual:   Yes.    Patient Status During Pull:  stable  Post Pull Groin Site:  Level 0  Post Pull Instructions Given:  Yes.    Post Pull Pulses Present:  Yes.    Dressing Applied:  Yes.    Comments:  VS stable thru sheath removal.  No complications at site.  Pt denies any discomfort at this time.

## 2014-06-21 NOTE — Progress Notes (Addendum)
MD paged for diet for pt. [name] 6:23 PM   Per Dr. Broadus John okay to start renal diet. [name] 6:26 PM

## 2014-06-22 DIAGNOSIS — I2789 Other specified pulmonary heart diseases: Secondary | ICD-10-CM

## 2014-06-22 LAB — GLUCOSE, CAPILLARY
GLUCOSE-CAPILLARY: 79 mg/dL (ref 70–99)
Glucose-Capillary: 110 mg/dL — ABNORMAL HIGH (ref 70–99)
Glucose-Capillary: 96 mg/dL (ref 70–99)
Glucose-Capillary: 105 mg/dL — ABNORMAL HIGH (ref 70–99)

## 2014-06-22 LAB — RENAL FUNCTION PANEL
Albumin: 2.6 g/dL — ABNORMAL LOW (ref 3.5–5.2)
Anion gap: 14 (ref 5–15)
BUN: 29 mg/dL — AB (ref 6–23)
CALCIUM: 9.2 mg/dL (ref 8.4–10.5)
CO2: 23 mEq/L (ref 19–32)
Chloride: 96 mEq/L (ref 96–112)
Creatinine, Ser: 5.18 mg/dL — ABNORMAL HIGH (ref 0.50–1.10)
GFR calc Af Amer: 9 mL/min — ABNORMAL LOW (ref >90)
GFR calc non Af Amer: 8 mL/min — ABNORMAL LOW (ref >90)
GLUCOSE: 100 mg/dL — AB (ref 70–99)
POTASSIUM: 5.1 meq/L (ref 3.7–5.3)
Phosphorus: 5.7 mg/dL — ABNORMAL HIGH (ref 2.3–4.6)
Sodium: 133 mEq/L — ABNORMAL LOW (ref 137–147)

## 2014-06-22 LAB — CBC
HCT: 37.1 % (ref 36.0–46.0)
HCT: 38 % (ref 36.0–46.0)
Hemoglobin: 11.1 g/dL — ABNORMAL LOW (ref 12.0–15.0)
Hemoglobin: 11 g/dL — ABNORMAL LOW (ref 12.0–15.0)
MCH: 29.8 pg (ref 26.0–34.0)
MCH: 28.5 pg (ref 26.0–34.0)
MCHC: 29.9 g/dL — AB (ref 30.0–36.0)
MCHC: 28.9 g/dL — ABNORMAL LOW (ref 30.0–36.0)
MCV: 99.5 fL (ref 78.0–100.0)
MCV: 98.4 fL (ref 78.0–100.0)
PLATELETS: 127 10*3/uL — AB (ref 150–400)
Platelets: 143 10*3/uL — ABNORMAL LOW (ref 150–400)
RBC: 3.73 MIL/uL — ABNORMAL LOW (ref 3.87–5.11)
RBC: 3.86 MIL/uL — ABNORMAL LOW (ref 3.87–5.11)
RDW: 18.7 % — AB (ref 11.5–15.5)
RDW: 18.6 % — ABNORMAL HIGH (ref 11.5–15.5)
WBC: 9.2 10*3/uL (ref 4.0–10.5)
WBC: 8.6 10*3/uL (ref 4.0–10.5)

## 2014-06-22 LAB — HEPARIN LEVEL (UNFRACTIONATED): Heparin Unfractionated: 0.27 IU/mL — ABNORMAL LOW (ref 0.30–0.70)

## 2014-06-22 MED ORDER — METOPROLOL TARTRATE 25 MG PO TABS
25.0000 mg | ORAL_TABLET | Freq: Two times a day (BID) | ORAL | Status: DC
  Administered 2014-06-22 – 2014-06-24 (×5): 25 mg via ORAL
  Filled 2014-06-22 (×6): qty 1

## 2014-06-22 MED ORDER — WARFARIN - PHARMACIST DOSING INPATIENT
Freq: Every day | Status: DC
Start: 2014-06-22 — End: 2014-06-24
  Administered 2014-06-22: 18:00:00

## 2014-06-22 MED ORDER — WARFARIN VIDEO: Freq: Once | Status: DC

## 2014-06-22 MED ORDER — WARFARIN SODIUM 4 MG PO TABS
4.0000 mg | ORAL_TABLET | Freq: Once | ORAL | Status: AC
  Administered 2014-06-22: 4 mg via ORAL
  Filled 2014-06-22: qty 1

## 2014-06-22 MED ORDER — ACETAMINOPHEN 325 MG PO TABS
ORAL_TABLET | ORAL | Status: AC
  Filled 2014-06-22: qty 2

## 2014-06-22 MED ORDER — COUMADIN BOOK
Freq: Once | Status: AC
  Administered 2014-06-22: 12:00:00
  Filled 2014-06-22: qty 1

## 2014-06-22 NOTE — Care Management Note (Signed)
    Page 1 of 2   06/23/2014     4:36:17 PM CARE MANAGEMENT NOTE 06/23/2014  Patient:  [name], [name]   Account Number:  [id number]  Date Initiated:  06/22/2014  Documentation initiated by:  [name]  Subjective/Objective Assessment:   Pt adm on 06/18/14 with pulm edema, volume overload.  PTA, pt resides at home with daughter.  Past hx of HH with [name].     Action/Plan:   PT/OT consults pending.  Will follow for recommendations.   Anticipated DC Date:  06/24/2014   Anticipated DC Plan:  [place]  CM consult      [facility] Choice  HOME HEALTH   Choice offered to / List presented to:  C-1 Patient        [place] arranged  HH-1 RN  [place] agency  [place]   Status of service:  Completed, signed off Medicare Important Message given?  YES (If response is "NO", the following Medicare IM given date fields will be blank) Date Medicare IM given:  06/22/2014 Medicare IM given by:  [name] Date Additional Medicare IM given:   Additional Medicare IM given by:    Discharge Disposition:  [place]  Per UR Regulation:  Reviewed for med. necessity/level of care/duration of stay  If discussed at [place] of Stay Meetings, dates discussed:    Comments:  06/23/14 Suzanne Lambert, RN, BSN [phone] [facility] ordered by MD; daughter at bedside.  Confirmed that they would like to go home with [facility] follow up with [facility] homecare.  Pt states "feels dizzy...not sure if I can go today, but I want to go home."  Notified RN of pt c/o.  Faxed referral for homecare to [name] at agency [phone]).  Start of care 24-48h post dc date.  06/23/14 Suzanne Lambert, RN, BSN [phone] PT recommending [place] followup with 24h supervision at dc; spoke with pt's daughter [name] to discuss dc planning. Daughter states she is "pretty sure that someone can be with her at all  times."  She states she will need to speak with family members to confirm.  Will cont to follow progress; anticipate that pt will likely dc home with family and HH follow up.

## 2014-06-22 NOTE — Progress Notes (Signed)
[facility] PROGRESS NOTE  [name] NAT:[id number] DOB: June 11, 1941 DOA: 06/18/2014 PCP: Gilford Rile, MD  PCCM transfer 9/23  Assessment/Plan: 1. Pulm Edema -improved with extra HD -Volume overload and new wall motion abnormalities on ECHO, EF 40% -Cards following -s/p R and LHC 9/23, with moderate CAD involving all 3 vessels -medical management recommended   2. AFib -in NSR now, rate controlled, on IV heparin per Cards -start Coumadin -on amiodarone -Coumadin is only option for anticoagulation given ESRD, but also supposed to have Surgery on RV fistula   3. ESRD -HD per Renal  4. Recent Cdiff -completed Vanc course, last PCR negative on 9/8  5. 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 Anemia of chronic disease -Aranesp with HD  7. Colo-Vaginal Fistula -seen by [facility] >1 week ago during last admission -Supposed to FU with Dr.ALicia Marcello Moores 4/27, has not been scheduled for surgery yet  8. DM -continue SSI  Code Status: Full Code Family Communication: none at bedside Disposition Plan: Home VS SNF   Consultants:  Cards  Renal  HPI/Subjective: Feels ok, breathing almost at baseline  Objective: Filed Vitals:   06/22/14 1112  BP: 122/47  Pulse: 72  Temp:   Resp:     Intake/Output Summary (Last 24 hours) at 06/22/14 1246 Last data filed at 06/22/14 1106  Gross per 24 hour  Intake    156 ml  Output   1423 ml  Net  -1267 ml   Filed Weights   06/21/14 0424 06/22/14 0422 06/22/14 0746  Weight: 71.2 kg (156 lb 15.5 oz) 69.9 kg (154 lb 1.6 oz) 71.4 kg (157 lb 6.5 oz)    Exam:   General:  AAOx3, no distress  Cardiovascular: S1S2/RRR  Respiratory: diminished BS at bases  Abdomen: soft, Nt, BS present  Musculoskeletal: no edema c/c  Data Reviewed: Basic Metabolic Panel:  Recent Labs Lab 06/18/14 1755 06/19/14 0815 06/20/14 0837 06/21/14 0230 06/22/14 0801  NA 143 143 141 141 133*  K 3.8 4.6 3.5* 4.1 5.1  CL 101 102  100 101 96  CO2 30 29 27 30 23   GLUCOSE 101* 98 96 99 100*  BUN 11 15 13 13  29*  CREATININE 2.99* 4.09* 3.18* 3.31* 5.18*  CALCIUM 8.6 8.7 8.3* 8.6 9.2  MG  --  2.0  --   --   --   PHOS 3.7 5.8* 4.4  --  5.7*   Liver Function Tests:  Recent Labs Lab 06/18/14 1755 06/19/14 0815 06/20/14 0837 06/22/14 0801  ALBUMIN 3.2* 3.1* 2.8* 2.6*   No results found for this basename: LIPASE, AMYLASE,  in the last 168 hours No results found for this basename: AMMONIA,  in the last 168 hours CBC:  Recent Labs Lab 06/19/14 0815 06/20/14 0221 06/21/14 0230 06/22/14 0455 06/22/14 0801  WBC 7.6 9.0 7.8 9.2 8.6  HGB 11.1* 10.6* 11.2* 11.1* 11.0*  HCT 38.9 36.8 39.1 37.1 38.0  MCV 101.6* 101.7* 101.3* 99.5 98.4  PLT 150 141* 120* 143* 127*   Cardiac Enzymes:  Recent Labs Lab 06/18/14 1350 06/18/14 1735 06/18/14 2316  TROPONINI <0.30 <0.30 <0.30   BNP (last 3 results)  Recent Labs  08/27/13 2220 04/19/14 0410 06/06/14 1620  PROBNP 14565.0* 30133.0* >70000.0*   CBG:  Recent Labs Lab 06/21/14 1714 06/21/14 1847 06/21/14 2158 06/22/14 0610 06/22/14 1226  GLUCAP 79 82 120* 79 110*    No results found for this or any previous visit (from the past 240 hour(s)).  Studies: Dg Chest Port 1 View  06/20/2014   CLINICAL DATA:  Followup CHF versus fluid overload.  EXAM: PORTABLE CHEST - 1 VIEW  COMPARISON:  Portable chest x-rays yesterday and dating back to 06/08/2014.  FINDINGS: Interval near complete resolution of the previously identified interstitial and airspace pulmonary edema since yesterday. Stable small bilateral pleural effusions, left greater than right, at with improved aeration in the lung bases. No new pulmonary parenchymal abnormalities. Cardiac silhouette moderately enlarged but stable.  IMPRESSION: Near complete resolution of CHF, with only mild interstitial pulmonary edema persisting. Stable bilateral pleural effusions, left greater than right. Improved aeration  in the lung bases, with mild atelectasis persisting. No new abnormalities.   Electronically Signed   By: Evangeline Dakin M.D.   On: 06/20/2014 15:47    Scheduled Meds: . amiodarone  400 mg Oral BID  . aspirin EC  81 mg Oral Daily  . calcitRIOL  0.25 mcg Oral Q M,W,F-HD  . calcium acetate  2,001 mg Oral TID WC  . coumadin book   Does not apply Once  . darbepoetin (ARANESP) injection - DIALYSIS  60 mcg Intravenous Q Wed-HD  . insulin aspart  0-9 Units Subcutaneous TID WC  . ipratropium-albuterol  3 mL Nebulization TID  . rOPINIRole  1 mg Oral BID AC & HS  . saccharomyces boulardii  250 mg Oral BID  . sodium chloride  3 mL Intravenous Q12H  . warfarin  4 mg Oral ONCE-1800  . warfarin   Does not apply Once  . Warfarin - Pharmacist Dosing Inpatient   Does not apply q1800   Continuous Infusions: . heparin 1,350 Units/hr (06/22/14 1130)   Antibiotics Given (last 72 hours)   None      Active Problems:   Acute respiratory failure   Atrial fibrillation with RVR   Hypotension arterial   CAD (coronary artery disease)   DCM (dilated cardiomyopathy)    Time spent: 39min    [place] [facility] Pager [phone]. If 7PM-7AM, please contact night-coverage at [email or web address], password [facility] 06/22/2014, 12:46 PM  LOS: 4 days

## 2014-06-22 NOTE — Progress Notes (Signed)
TELEMETRY: Reviewed telemetry pt in NSR, rate 70s: Filed Vitals:   06/22/14 1000 06/22/14 1026 06/22/14 1106 06/22/14 1112  BP: 99/46 127/50 103/46 122/47  Pulse: 75 74 72 72  Temp:   97.1 F (36.2 C)   TempSrc:   Oral   Resp: 27 26 28    Height:      Weight:      SpO2:   96%     Intake/Output Summary (Last 24 hours) at 06/22/14 1258 Last data filed at 06/22/14 1106  Gross per 24 hour  Intake    156 ml  Output   1423 ml  Net  -1267 ml   Filed Weights   06/21/14 0424 06/22/14 0422 06/22/14 0746  Weight: 156 lb 15.5 oz (71.2 kg) 154 lb 1.6 oz (69.9 kg) 157 lb 6.5 oz (71.4 kg)    Subjective Feels much better. Denies SOB or CP currently. Able to lie supine.  Marland Kitchen amiodarone  400 mg Oral BID  . aspirin EC  81 mg Oral Daily  . calcitRIOL  0.25 mcg Oral Q M,W,F-HD  . calcium acetate  2,001 mg Oral TID WC  . coumadin book   Does not apply Once  . darbepoetin (ARANESP) injection - DIALYSIS  60 mcg Intravenous Q Wed-HD  . insulin aspart  0-9 Units Subcutaneous TID WC  . ipratropium-albuterol  3 mL Nebulization TID  . metoprolol tartrate  25 mg Oral BID  . rOPINIRole  1 mg Oral BID AC & HS  . saccharomyces boulardii  250 mg Oral BID  . sodium chloride  3 mL Intravenous Q12H  . warfarin  4 mg Oral ONCE-1800  . warfarin   Does not apply Once  . Warfarin - Pharmacist Dosing Inpatient   Does not apply q1800      LABS: Basic Metabolic Panel:  Recent Labs  06/20/14 0837 06/21/14 0230 06/22/14 0801  NA 141 141 133*  K 3.5* 4.1 5.1  CL 100 101 96  CO2 27 30 23   GLUCOSE 96 99 100*  BUN 13 13 29*  CREATININE 3.18* 3.31* 5.18*  CALCIUM 8.3* 8.6 9.2  PHOS 4.4  --  5.7*   Liver Function Tests:  Recent Labs  06/20/14 0837 06/22/14 0801  ALBUMIN 2.8* 2.6*   No results found for this basename: LIPASE, AMYLASE,  in the last 72 hours CBC:  Recent Labs  06/22/14 0455 06/22/14 0801  WBC 9.2 8.6  HGB 11.1* 11.0*  HCT 37.1 38.0  MCV 99.5 98.4  PLT 143* 127*    Cardiac Enzymes: No results found for this basename: CKTOTAL, CKMB, CKMBINDEX, TROPONINI,  in the last 72 hours BNP: No results found for this basename: PROBNP,  in the last 72 hours D-Dimer: No results found for this basename: DDIMER,  in the last 72 hours Hemoglobin A1C: No results found for this basename: HGBA1C,  in the last 72 hours Fasting Lipid Panel: No results found for this basename: CHOL, HDL, LDLCALC, TRIG, CHOLHDL, LDLDIRECT,  in the last 72 hours Thyroid Function Tests: No results found for this basename: TSH, T4TOTAL, FREET3, T3FREE, THYROIDAB,  in the last 72 hours   Radiology/Studies:  Dg Chest Port 1 View  06/20/2014   CLINICAL DATA:  Followup CHF versus fluid overload.  EXAM: PORTABLE CHEST - 1 VIEW  COMPARISON:  Portable chest x-rays yesterday and dating back to 06/08/2014.  FINDINGS: Interval near complete resolution of the previously identified interstitial and airspace pulmonary edema since yesterday. Stable small bilateral pleural effusions,  left greater than right, at with improved aeration in the lung bases. No new pulmonary parenchymal abnormalities. Cardiac silhouette moderately enlarged but stable.  IMPRESSION: Near complete resolution of CHF, with only mild interstitial pulmonary edema persisting. Stable bilateral pleural effusions, left greater than right. Improved aeration in the lung bases, with mild atelectasis persisting. No new abnormalities.   Electronically Signed   By: Evangeline Dakin M.D.   On: 06/20/2014 15:47   Ecg: NSR with LBBB  Echo:Study Conclusions  - Procedure narrative: Transthoracic echocardiography. Image quality was adequate. The study was technically difficult. - Left ventricle: Hypokinesis of the inferior septum, inferior wall, and inferolateral wall. This is new since the study of 2014. The cavity size was normal. Wall thickness was increased in a pattern of moderate LVH. The estimated ejection fraction was 40%. - Aortic valve:  Sclerosis without stenosis. There was trivial regurgitation. - Mitral valve: Over time there has been some increase in the mitral inflow gradients. There may be mild functional mitral stenosis. There was mild regurgitation. - Left atrium: The atrium was moderately dilated. - Right ventricle: Systolic function was mildly reduced. - Impressions: Large pleural effusion.  Impressions:  - Large pleural effusion.  Left and Right Heart Catheterization with Coronary Angiography, Abdominal Aortography, and Right Femoral Angiography Report  [name]  73 y.o.  female  1941-07-18  Procedure Date: 06/21/2014  Referring Physician: Mertie Moores, M.D.  Primary Cardiologist: Fransico Him, M.D.  INDICATIONS: Chest discomfort in setting of atrial fibrillation with rapid ventricular response. Prior history of coronary stenting involving the LAD  PROCEDURE: 1. Left heart catheterization; 2. Coronary angiography; 3. Right heart catheterization; 4. Distal abdominal aortic aortography; 5. Right femoral angiography  CONSENT:  The risks, benefits, and details of the procedure were explained in detail to the patient. Risks including death, stroke, heart attack, kidney injury, allergy, limb ischemia, bleeding and radiation injury were discussed. The patient verbalized understanding and wanted to proceed. Informed written consent was obtained.  PROCEDURE TECHNIQUE: After Xylocaine anesthesia a 5 French arterial sheath was placed in the right femoral artery using the modified Seldinger technique. A 7 French sheath was placed in the right femoral vein using the modified Seldinger technique. Right heart catheterization was performed using a 7 French balloon tipped Swan-Ganz catheter. An oximetry sample was obtained in the main pulmonary artery. Heart catheterization was performed via the right femoral artery we could not advance the guidewire beyond the origin of the iliac. Multiple wires were used we will unable to  cross the total occlusion. Angiography documented occlusion of the right iliac ostium. We then switched to the left femoral approach where a 5 French sheath was placed after local anesthesia. We will able to advance a guidewire with some difficulty into the descending aorta. Coronary angiography was then performed using a 5 [place] A2 multipurpose catheter. Hemodynamic recordings and a central aortic oximetry sample was obtained with this cath. Left ventriculography was performed by hand injection using the multipurpose catheter.. We used a 5 French 4 cm Judkins right 4 right coronary angiography. There was mild catheter damping with engagement but reflux of contrast into the aorta during injection.  At the completion of angiography the digital images were reviewed and the case was terminated.  CONTRAST: Total of 100 cc.  COMPLICATIONS: None  HEMODYNAMICS: Aortic pressure 150/54 mmHg; LV pressure 154/18 mmHg; LVEDP 17 mm mercury; RA 5 mm mercury; RV 49/10 mmHg; PA 49/22 mmHg; PCWP(mean) 19 mm mercury; Cardiac Output 6.4 L per minute by  Fick and 5.15 L per minute by thermodilution; mean mitral valve gradient 8 mm mercury; mitral valve area 2.43 cm square; AV gradient 0  ANGIOGRAPHIC DATA: The left main coronary artery is calcified but widely patent..  The left anterior descending artery is patent and contains a proximal stent. Proximal to the stent there is segmental 50% narrowing. There is ostial 40-50% LAD narrowing. The first diagonal arises from within the stented segment and contains ostial and proximal 80-90% obstruction.  The left circumflex artery is patent. The first obtuse marginal before bifurcating into 2 significant branches, contains segmental 50-70% narrowing. The mid circumflex contains 30 -50% narrowing before ending one to small to moderate sized distal obtuse marginal branches..  The right coronary artery is contains a heavily calcified ostium and right sinus of Valsalva. There is 50-70%  ostial narrowing. Proximal to mid segmental stenosis between 50 and 70%. PDA is noted distally. No high-grade obstruction is noted in the RCA.  ABDOMINAL AORTOGRAPHY: Documented a total occlusion of the right iliac at the bifurcation. Significant distal aortic plaquing as noted above the bifurcation. There is up to 50% obstruction of the left iliac. Both iliacs a heavily calcified.  RIGHT ILIAC ANGIOGRAPHY: Totally occluded at its origin from the distal aorta. Heavy calcification is noted circumferentially in the right iliac.  LEFT VENTRICULOGRAM: Left ventricular angiogram was done in the 30 RAO projection and revealed normal LV cavity size with asymmetric contractile pattern and an estimated ejection fraction of 40-50%. The mitral valve and annulus the heavily calcified. The aortic valve contains moderate calcification.  IMPRESSIONS: 1. Moderate coronary artery disease involving all 3 vessels including 50% proximal LAD, 50-70% first obtuse marginal, 70% ostial RCA, and 50-70% segmental proximal RCA. The stent in the proximal LAD is widely patent with 80% stenosis noted in the ostium and proximal portion of the jailed first diagonal.  2. Mildly depressed LV systolic function with an estimated ejection fraction of 40-50%.  3. 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  4. Distal abdominal aortic plaquing.  5. Mitral valve area 2.4 cm square.  6. Moderate pulmonary hypertension  RECOMMENDATION: Risk factor modification.  Dialysis to lower filling pressures.  Beta blocker therapy as needed for functional mitral stenosis.   PHYSICAL EXAM General: Overweight,elderly WF in NAD Head: Normocephalic, atraumatic, sclera non-icteric, oropharynx is clear Neck: Negative for carotid bruits. JVD not elevated. No adenopathy Lungs: Clear bilaterally to auscultation without wheezes, rales, or rhonchi. Breathing is unlabored. Heart: RRR S1 S2 without murmurs, rubs, or  gallops.  Abdomen: Soft, non-tender, non-distended with normoactive bowel sounds. No hepatomegaly. No rebound/guarding. No obvious abdominal masses. Extremities: No clubbing, cyanosis or edema.  Neuro: Alert and oriented X 3. Moves all extremities spontaneously. Psych:  Responds to questions appropriately with a normal affect.  ASSESSMENT AND PLAN: 1. Acute respiratory failure with hypoxia. Clinically much improved with dialysis and correction of volume excess. CXR improved. Tolerated dialysis well.   2. Acute combined systolic/diastolic CHF. LBBB. EF 40-50%. Correction of volume excess with dialysis. Will add metoprolol for rate control.   3. Atrial fibrillation. Converted to NSR on amiodarone. 400 mg bid for one week then daily. Now starting on Coumadin for long term anticoagulation. She doesn't really need bridging with heparin since she was in AFib less than 72 hours. Will DC IV heparin.  4. ESRD on dialysis.   5. Hyperlipidemia. Statin intolerant in past. On Zetia.   6. S/p bilateral CEA.  7. HTN  8. CAD with remote stent of LAD. Moderate diffuse 3 vessel disease. Will treat medically.  9. DM  10. COPD oxygen dependent.  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  12. Moderate pulmonary HTN  13. Functional mitral stenosis-mild.   Present on Admission:  . Acute respiratory failure . CAD (coronary artery disease)  Signed, Beni Turrell [place], [place] 06/22/2014 12:58 PM

## 2014-06-22 NOTE — Progress Notes (Signed)
ANTICOAGULATION CONSULT NOTE - Follow Up Consult  Pharmacy Consult for Heparin + Warfarin Indication: atrial fibrillation  Allergies  Allergen Reactions  . Lipitor [Atorvastatin] Other (See Comments)    Causes weakness and drowsiness  . Morphine And Related Nausea And Vomiting  . Nsaids Other (See Comments)    Unknown reported by previous hospital    Patient Measurements: Height: 5\' 4"  (162.6 cm) Weight: 157 lb 6.5 oz (71.4 kg) IBW/kg (Calculated) : 54.7 Heparin Dosing Weight: 69 kg  Vital Signs: Temp: 98.2 F (36.8 C) (09/24 0746) Temp src: Oral (09/24 0746) BP: 127/50 mmHg (09/24 1026) Pulse Rate: 74 (09/24 1026)  Labs:  Recent Labs  06/20/14 0837  06/21/14 0230 06/21/14 1200 06/22/14 0455 06/22/14 0801 06/22/14 0805  HGB  --   --  11.2*  --  11.1* 11.0*  --   HCT  --   --  39.1  --  37.1 38.0  --   PLT  --   --  120*  --  143* 127*  --   LABPROT  --   --  14.2  --   --   --   --   INR  --   --  1.10  --   --   --   --   HEPARINUNFRC  --   < > 0.36 0.25*  --   --  0.27*  CREATININE 3.18*  --  3.31*  --   --  5.18*  --   < > = values in this interval not displayed.  Estimated Creatinine Clearance: 9.5 ml/min (by C-G formula based on Cr of 5.18).   Medications:  Heparin @ 1200 units/hr (12 ml/hr)  Assessment: 73 YOF who was resumed on heparin post cardiac cath on 9/23 for hx Afib with a SUBtherapeutic heparin level this morning (HL 0.27, goal of 0.3-0.7). CBC stable - no overt s/sx of bleeding noted.  Pharmacy also consulted to start warfarin today for oral anticoagulation for Afib. Per Dr. Broadus John - the patient has no planned surgery currently and okay to start this evening. Baseline INR 1.1, alb 2.6, on amiodarone which is known to increase warfarin sensitivity. Will start with a lower warfarin dose and monitor closely.   Goal of Therapy:  Heparin level 0.3-0.7 units/ml Monitor platelets by anticoagulation protocol: Yes   Plan:  1. Increase heparin to  1350 units/hr  2. Warfarin 4 mg x 1 dose at 1800 today 3. Warfarin book/video 4. Daily PT/INR 5. Will continue to monitor for any signs/symptoms of bleeding and will follow up with heparin level in 8 hours and PT/INR in the AM  Alycia Rossetti, PharmD, BCPS Clinical Pharmacist Pager: [phone] 06/22/2014 10:46 AM

## 2014-06-22 NOTE — Progress Notes (Signed)
TELEMETRY: Reviewed telemetry pt in NSR, rate 70s: Filed Vitals:   06/22/14 1000 06/22/14 1026 06/22/14 1106 06/22/14 1112  BP: 99/46 127/50 103/46 122/47  Pulse: 75 74 72 72  Temp:   97.1 F (36.2 C)   TempSrc:   Oral   Resp: 27 26 28    Height:      Weight:      SpO2:   96%     Intake/Output Summary (Last 24 hours) at 06/22/14 1251 Last data filed at 06/22/14 1106  Gross per 24 hour  Intake    156 ml  Output   1423 ml  Net  -1267 ml   Filed Weights   06/21/14 0424 06/22/14 0422 06/22/14 0746  Weight: 156 lb 15.5 oz (71.2 kg) 154 lb 1.6 oz (69.9 kg) 157 lb 6.5 oz (71.4 kg)    Subjective Feels much better. Denies SOB or CP currently. Able to lie supine.  Marland Kitchen amiodarone  400 mg Oral BID  . aspirin EC  81 mg Oral Daily  . calcitRIOL  0.25 mcg Oral Q M,W,F-HD  . calcium acetate  2,001 mg Oral TID WC  . coumadin book   Does not apply Once  . darbepoetin (ARANESP) injection - DIALYSIS  60 mcg Intravenous Q Wed-HD  . insulin aspart  0-9 Units Subcutaneous TID WC  . ipratropium-albuterol  3 mL Nebulization TID  . rOPINIRole  1 mg Oral BID AC & HS  . saccharomyces boulardii  250 mg Oral BID  . sodium chloride  3 mL Intravenous Q12H  . warfarin  4 mg Oral ONCE-1800  . warfarin   Does not apply Once  . Warfarin - Pharmacist Dosing Inpatient   Does not apply q1800   . heparin 1,350 Units/hr (06/22/14 1130)    LABS: Basic Metabolic Panel:  Recent Labs  06/20/14 0837 06/21/14 0230 06/22/14 0801  NA 141 141 133*  K 3.5* 4.1 5.1  CL 100 101 96  CO2 27 30 23   GLUCOSE 96 99 100*  BUN 13 13 29*  CREATININE 3.18* 3.31* 5.18*  CALCIUM 8.3* 8.6 9.2  PHOS 4.4  --  5.7*   Liver Function Tests:  Recent Labs  06/20/14 0837 06/22/14 0801  ALBUMIN 2.8* 2.6*   No results found for this basename: LIPASE, AMYLASE,  in the last 72 hours CBC:  Recent Labs  06/22/14 0455 06/22/14 0801  WBC 9.2 8.6  HGB 11.1* 11.0*  HCT 37.1 38.0  MCV 99.5 98.4  PLT 143* 127*    Cardiac Enzymes: No results found for this basename: CKTOTAL, CKMB, CKMBINDEX, TROPONINI,  in the last 72 hours BNP: No results found for this basename: PROBNP,  in the last 72 hours D-Dimer: No results found for this basename: DDIMER,  in the last 72 hours Hemoglobin A1C: No results found for this basename: HGBA1C,  in the last 72 hours Fasting Lipid Panel: No results found for this basename: CHOL, HDL, LDLCALC, TRIG, CHOLHDL, LDLDIRECT,  in the last 72 hours Thyroid Function Tests: No results found for this basename: TSH, T4TOTAL, FREET3, T3FREE, THYROIDAB,  in the last 72 hours   Radiology/Studies:  Dg Chest Port 1 View  06/20/2014   CLINICAL DATA:  Followup CHF versus fluid overload.  EXAM: PORTABLE CHEST - 1 VIEW  COMPARISON:  Portable chest x-rays yesterday and dating back to 06/08/2014.  FINDINGS: Interval near complete resolution of the previously identified interstitial and airspace pulmonary edema since yesterday. Stable small bilateral pleural effusions, left greater than  right, at with improved aeration in the lung bases. No new pulmonary parenchymal abnormalities. Cardiac silhouette moderately enlarged but stable.  IMPRESSION: Near complete resolution of CHF, with only mild interstitial pulmonary edema persisting. Stable bilateral pleural effusions, left greater than right. Improved aeration in the lung bases, with mild atelectasis persisting. No new abnormalities.   Electronically Signed   By: Evangeline Dakin M.D.   On: 06/20/2014 15:47   Ecg: NSR with LBBB  Echo:Study Conclusions  - Procedure narrative: Transthoracic echocardiography. Image quality was adequate. The study was technically difficult. - Left ventricle: Hypokinesis of the inferior septum, inferior wall, and inferolateral wall. This is new since the study of 2014. The cavity size was normal. Wall thickness was increased in a pattern of moderate LVH. The estimated ejection fraction was 40%. - Aortic valve:  Sclerosis without stenosis. There was trivial regurgitation. - Mitral valve: Over time there has been some increase in the mitral inflow gradients. There may be mild functional mitral stenosis. There was mild regurgitation. - Left atrium: The atrium was moderately dilated. - Right ventricle: Systolic function was mildly reduced. - Impressions: Large pleural effusion.  Impressions:  - Large pleural effusion.  Left and Right Heart Catheterization with Coronary Angiography, Abdominal Aortography, and Right Femoral Angiography Report  [name]  73 y.o.  female  06/05/1941  Procedure Date: 06/21/2014  Referring Physician: Mertie Cook, M.D.  Primary Cardiologist: [name], M.D.  INDICATIONS: Chest discomfort in setting of atrial fibrillation with rapid ventricular response. Prior history of coronary stenting involving the LAD  PROCEDURE: 1. Left heart catheterization; 2. Coronary angiography; 3. Right heart catheterization; 4. Distal abdominal aortic aortography; 5. Right femoral angiography  CONSENT:  The risks, benefits, and details of the procedure were explained in detail to the patient. Risks including death, stroke, heart attack, kidney injury, allergy, limb ischemia, bleeding and radiation injury were discussed. The patient verbalized understanding and wanted to proceed. Informed written consent was obtained.  PROCEDURE TECHNIQUE: After Xylocaine anesthesia a 5 French arterial sheath was placed in the right femoral artery using the modified Seldinger technique. A 7 French sheath was placed in the right femoral vein using the modified Seldinger technique. Right heart catheterization was performed using a 7 French balloon tipped Swan-Ganz catheter. An oximetry sample was obtained in the main pulmonary artery. Heart catheterization was performed via the right femoral artery we could not advance the guidewire beyond the origin of the iliac. Multiple wires were used we will unable to  cross the total occlusion. Angiography documented occlusion of the right iliac ostium. We then switched to the left femoral approach where a 5 French sheath was placed after local anesthesia. We will able to advance a guidewire with some difficulty into the descending aorta. Coronary angiography was then performed using a 5 [place] A2 multipurpose catheter. Hemodynamic recordings and a central aortic oximetry sample was obtained with this cath. Left ventriculography was performed by hand injection using the multipurpose catheter.. We used a 5 French 4 cm Judkins right 4 right coronary angiography. There was mild catheter damping with engagement but reflux of contrast into the aorta during injection.  At the completion of angiography the digital images were reviewed and the case was terminated.  CONTRAST: Total of 100 cc.  COMPLICATIONS: None  HEMODYNAMICS: Aortic pressure 150/54 mmHg; LV pressure 154/18 mmHg; LVEDP 17 mm mercury; RA 5 mm mercury; RV 49/10 mmHg; PA 49/22 mmHg; PCWP(mean) 19 mm mercury; Cardiac Output 6.4 L per minute by Fick and 5.15  L per minute by thermodilution; mean mitral valve gradient 8 mm mercury; mitral valve area 2.43 cm square; AV gradient 0  ANGIOGRAPHIC DATA: The left main coronary artery is calcified but widely patent..  The left anterior descending artery is patent and contains a proximal stent. Proximal to the stent there is segmental 50% narrowing. There is ostial 40-50% LAD narrowing. The first diagonal arises from within the stented segment and contains ostial and proximal 80-90% obstruction.  The left circumflex artery is patent. The first obtuse marginal before bifurcating into 2 significant branches, contains segmental 50-70% narrowing. The mid circumflex contains 30 -50% narrowing before ending one to small to moderate sized distal obtuse marginal branches..  The right coronary artery is contains a heavily calcified ostium and right sinus of Valsalva. There is 50-70%  ostial narrowing. Proximal to mid segmental stenosis between 50 and 70%. PDA is noted distally. No high-grade obstruction is noted in the RCA.  ABDOMINAL AORTOGRAPHY: Documented a total occlusion of the right iliac at the bifurcation. Significant distal aortic plaquing as noted above the bifurcation. There is up to 50% obstruction of the left iliac. Both iliacs a heavily calcified.  RIGHT ILIAC ANGIOGRAPHY: Totally occluded at its origin from the distal aorta. Heavy calcification is noted circumferentially in the right iliac.  LEFT VENTRICULOGRAM: Left ventricular angiogram was done in the 30 RAO projection and revealed normal LV cavity size with asymmetric contractile pattern and an estimated ejection fraction of 40-50%. The mitral valve and annulus the heavily calcified. The aortic valve contains moderate calcification.  IMPRESSIONS: 1. Moderate coronary artery disease involving all 3 vessels including 50% proximal LAD, 50-70% first obtuse marginal, 70% ostial RCA, and 50-70% segmental proximal RCA. The stent in the proximal LAD is widely patent with 80% stenosis noted in the ostium and proximal portion of the jailed first diagonal.  2. Mildly depressed LV systolic function with an estimated ejection fraction of 40-50%.  3. 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  4. Distal abdominal aortic plaquing.  5. Mitral valve area 2.4 cm square.  6. Moderate pulmonary hypertension  RECOMMENDATION: Risk factor modification.  Dialysis to lower filling pressures.  Beta blocker therapy as needed for functional mitral stenosis.   PHYSICAL EXAM General: Overweight,elderly WF in NAD Head: Normocephalic, atraumatic, sclera non-icteric, oropharynx is clear Neck: Negative for carotid bruits. JVD not elevated. No adenopathy Lungs: Clear bilaterally to auscultation without wheezes, rales, or rhonchi. Breathing is unlabored. Heart: RRR S1 S2 without murmurs, rubs, or  gallops.  Abdomen: Soft, non-tender, non-distended with normoactive bowel sounds. No hepatomegaly. No rebound/guarding. No obvious abdominal masses. Extremities: No clubbing, cyanosis or edema.  Neuro: Alert and oriented X 3. Moves all extremities spontaneously. Psych:  Responds to questions appropriately with a normal affect.  ASSESSMENT AND PLAN: 1. Acute respiratory failure with hypoxia. Clinically much improved with dialysis and correction of volume excess. CXR improved. Tolerated dialysis well.   2. Acute combined systolic/diastolic CHF. LBBB. EF 40-50%. Correction of volume excess with dialysis. Will add metoprolol for rate control.   3. Atrial fibrillation. Converted to NSR on amiodarone. 400 mg bid for one week then daily. Now starting on Coumadin for long term anticoagulation. She doesn't really need bridging with heparin since she was in AFib less than 72 hours. Will DC IV heparin.  4. ESRD on dialysis.   5. Hyperlipidemia. Statin intolerant in past. On Zetia.   6. S/p bilateral CEA.  7. HTN  8. CAD  with remote stent of LAD. Moderate diffuse 3 vessel disease. Will treat medically.  9. DM  10. COPD oxygen dependent.  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   Present on Admission:  . Acute respiratory failure . CAD (coronary artery disease)  Signed, [name], Sylvia 06/22/2014 12:51 PM

## 2014-06-22 NOTE — Progress Notes (Signed)
OT Cancellation Note  Patient Details Name: [name] MRN: [id number] DOB: 1941-06-03   Cancelled Treatment:    Reason Eval/Treat Not Completed: Patient at procedure or test/ unavailable. Pt off unit at HD, will re attempt later today as time allows  Britt Bottom 06/22/2014, 10:03 AM

## 2014-06-22 NOTE — Progress Notes (Signed)
Cosigned student nurse's assessment. [name] 3:32 PM

## 2014-06-22 NOTE — Progress Notes (Signed)
PT Cancellation Note  Patient Details Name: [name] MRN: [id number] DOB: 1941-09-22   Cancelled Treatment:    Reason Eval/Treat Not Completed: Patient at procedure or test/unavailable (In HD all am.) Will reattempt as time allows.  Thanks.   INGOLD,Vic Esco 06/22/2014, 10:22 AM [facility] [phone] (pager)

## 2014-06-22 NOTE — Procedures (Signed)
I was present at this dialysis session. I have reviewed the session itself and made appropriate changes.   Doing well.  SOB stable.  BP ok.  AVF. RS  Pearson Grippe  MD 06/22/2014, 10:24 AM

## 2014-06-23 DIAGNOSIS — I10 Essential (primary) hypertension: Secondary | ICD-10-CM

## 2014-06-23 LAB — CBC
HEMATOCRIT: 37.3 % (ref 36.0–46.0)
HEMOGLOBIN: 10.6 g/dL — AB (ref 12.0–15.0)
MCH: 28.6 pg (ref 26.0–34.0)
MCHC: 28.4 g/dL — AB (ref 30.0–36.0)
MCV: 100.5 fL — AB (ref 78.0–100.0)
Platelets: 119 10*3/uL — ABNORMAL LOW (ref 150–400)
RBC: 3.71 MIL/uL — ABNORMAL LOW (ref 3.87–5.11)
RDW: 18.5 % — ABNORMAL HIGH (ref 11.5–15.5)
WBC: 9.8 10*3/uL (ref 4.0–10.5)

## 2014-06-23 LAB — GLUCOSE, CAPILLARY
GLUCOSE-CAPILLARY: 86 mg/dL (ref 70–99)
GLUCOSE-CAPILLARY: 116 mg/dL — AB (ref 70–99)
Glucose-Capillary: 83 mg/dL (ref 70–99)
Glucose-Capillary: 91 mg/dL (ref 70–99)

## 2014-06-23 LAB — PROTIME-INR
INR: 1.1 (ref 0.00–1.49)
PROTHROMBIN TIME: 14.2 s (ref 11.6–15.2)

## 2014-06-23 MED ORDER — SODIUM CHLORIDE 0.9 % IV SOLN: 100.0000 mL | INTRAVENOUS | Status: DC | PRN

## 2014-06-23 MED ORDER — ALTEPLASE 2 MG IJ SOLR
2.0000 mg | Freq: Once | INTRAMUSCULAR | Status: DC | PRN
  Filled 2014-06-23: qty 2

## 2014-06-23 MED ORDER — LIDOCAINE HCL (PF) 1 % IJ SOLN: 5.0000 mL | INTRAMUSCULAR | Status: DC | PRN

## 2014-06-23 MED ORDER — PENTAFLUOROPROP-TETRAFLUOROETH EX AERO: 1.0000 "application " | INHALATION_SPRAY | CUTANEOUS | Status: DC | PRN

## 2014-06-23 MED ORDER — AMIODARONE HCL 200 MG PO TABS
400.0000 mg | ORAL_TABLET | Freq: Every day | ORAL | Status: DC
  Administered 2014-06-23 – 2014-06-24 (×2): 400 mg via ORAL
  Filled 2014-06-23 (×2): qty 2

## 2014-06-23 MED ORDER — WARFARIN SODIUM 5 MG PO TABS
5.0000 mg | ORAL_TABLET | Freq: Once | ORAL | Status: AC
  Administered 2014-06-23: 5 mg via ORAL
  Filled 2014-06-23: qty 1

## 2014-06-23 MED ORDER — LIDOCAINE-PRILOCAINE 2.5-2.5 % EX CREA
1.0000 "application " | TOPICAL_CREAM | CUTANEOUS | Status: DC | PRN
  Filled 2014-06-23: qty 5

## 2014-06-23 MED ORDER — AMIODARONE HCL 400 MG PO TABS: 400.0000 mg | ORAL_TABLET | Freq: Every day | ORAL | Status: AC

## 2014-06-23 MED ORDER — NEPRO/CARBSTEADY PO LIQD
237.0000 mL | ORAL | Status: DC | PRN
  Filled 2014-06-23: qty 237

## 2014-06-23 MED ORDER — WARFARIN SODIUM 5 MG PO TABS: 5.0000 mg | ORAL_TABLET | Freq: Once | ORAL | Status: DC

## 2014-06-23 MED ORDER — HEPARIN SODIUM (PORCINE) 1000 UNIT/ML DIALYSIS: 1000.0000 [IU] | INTRAMUSCULAR | Status: DC | PRN

## 2014-06-23 NOTE — Discharge Summary (Addendum)
Physician Discharge Summary  KHARI LETT XNA:[id number] DOB: 09/28/1941 DOA: 06/18/2014  PCP: Gilford Rile, MD  Admit date: 06/18/2014 Discharge date: 06/23/2014  Time spent: 45 minutes  Recommendations for Outpatient Follow-up:  1. PCP Dr.Grisso in 1 week, pt and daughter asked to make appt as PCP's office was closed when we tried to get her a FU 2. Rennis Chris, [facility] on 9/29 for Colo-vaginal fistula FU 3. 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 Work up found the patient to have pulmonary edema on CXR & hypercarbic resp failure   Hospital Course:  1. Pulm Edema -improved with extra HD  -Volume overload and new wall motion abnormalities on ECHO, EF 40%  -Followed by Cardiology -s/p R and LHC 9/23, with moderate CAD involving all 3 vessels  -medical management recommended, continue ASA/Metoprolol   2. AFib  -in NSR now, rate controlled,  -initially started on IV heaprin per Cards and then started on Coumadin  -now off heparin, no need to bridge per Cards -on amiodarone 400mg  BID for 1 week then daily  -Coumadin is only option for anticoagulation given ESRD -needs INR check per PCP  3. ESRD -HD per Renal   4. Recent Cdiff  -completed Vanc course, last PCR negative on  9/8  -bowels stable per pt report  5. COPD/Chornic resp failure  -on 3L Home O2, stable   6. Anemia of chronic disease  -Aranesp with HD   7. Colo-Vaginal Fistula  -seen by [facility] >1 week ago during last admission  -Supposed to FU with Dr.ALicia Marcello Moores 5/42, has not been scheduled for surgery yet   8. DM  -stable, was on SSI inpatient   Procedures:  Right and Left Heart Cath:  1. Moderate coronary artery disease involving all 3 vessels including 50% proximal LAD, 50-70% first obtuse marginal, 70% ostial RCA, and 50-70% segmental proximal RCA. The stent in the proximal LAD is widely patent with 80% stenosis noted in the ostium and proximal portion of the jailed first diagonal.  2. Mildly depressed LV systolic function with an estimated ejection fraction of 40-50%.  3. 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  4. Distal abdominal aortic plaquing.  5. Mitral valve area 2.4 cm square.  6. Moderate pulmonary hypertension   Consultations:  Renal  CArds  Discharge Exam: Filed Vitals:   06/23/14 1444  BP: 118/50  Pulse: 56  Temp:   Resp:     General: AAOx3 Cardiovascular: S1S2/RRR Respiratory: CTAB  Discharge Instructions You were cared for by a hospitalist during your hospital stay. If you have any questions about your discharge medications  or the care you received while you were in the hospital after you are discharged, you can call the unit and asked to speak with the hospitalist on call if the hospitalist that took care of you is not available. Once you are discharged, your primary care physician will handle any further medical issues. Please note that NO REFILLS for any discharge medications will be authorized once you are discharged, as it is imperative that you return to your primary care physician (or establish a relationship with a primary care physician if you do not have one) for your aftercare needs so that they can reassess  your need for medications and monitor your lab values.   Current Discharge Medication List    CONTINUE these medications which have NOT CHANGED   Details  amiodarone (PACERONE) 200 MG tablet Take 200 mg by mouth daily.    calcium acetate (PHOSLO) 667 MG capsule Take 2,001 mg by mouth 3 (three) times daily with meals.     cephALEXin (KEFLEX) 250 MG capsule Take 1 capsule (250 mg total) by mouth 2 (two) times daily. X 10days Qty: 20 capsule, Refills: 0    dicyclomine (BENTYL) 10 MG capsule Take 10 mg by mouth daily as needed for spasms.    ezetimibe (ZETIA) 10 MG tablet Take 10 mg by mouth at bedtime.    glipiZIDE (GLUCOTROL XL) 10 MG 24 hr tablet Take 10 mg by mouth daily as needed (for FSBS >150).    HYDROcodone-acetaminophen (NORCO/VICODIN) 5-325 MG per tablet Take 0.5 tablets by mouth every 6 (six) hours as needed for moderate pain.    ipratropium (ATROVENT HFA) 17 MCG/ACT inhaler Inhale 2 puffs into the lungs every 6 (six) hours as needed for wheezing.    metoCLOPramide (REGLAN) 5 MG tablet Take 5 mg by mouth 2 (two) times daily.    metroNIDAZOLE (FLAGYL) 500 MG tablet Take 1 tablet (500 mg total) by mouth 3 (three) times daily. X 10days Qty: 30 tablet, Refills: 0    multivitamin (RENA-VIT) TABS tablet Take 1 tablet by mouth daily with lunch.    ondansetron (ZOFRAN-ODT) 4 MG disintegrating tablet Take 1 tablet (4 mg total) by mouth every 8 (eight) hours as needed for nausea or vomiting. Qty: 10 tablet, Refills: 0    rOPINIRole (REQUIP) 1 MG tablet Take 1 mg by mouth 2 (two) times daily.       Allergies  Allergen Reactions  . Lipitor [Atorvastatin] Other (See Comments)    Causes weakness and drowsiness  . Morphine And Related Nausea And Vomiting  . Nsaids Other (See Comments)    Unknown reported by previous hospital      The results of significant diagnostics from this hospitalization (including imaging, microbiology, ancillary and laboratory) are listed below for  reference.    Significant Diagnostic Studies: Dg Chest 2 View  05/27/2014   CLINICAL DATA:  Multiple emesis, diarrhea, generalized weakness and fatigue today. Hemodialysis yesterday. Recent hospital discharge for pneumonia.  EXAM: CHEST  2 VIEW  COMPARISON:  05/21/2014  FINDINGS: Cardiac enlargement with pulmonary vascular congestion and mild perihilar edema. Findings are improving since previous study. No pleural effusions. No pneumothorax. No focal consolidation. Calcification of the aorta.  IMPRESSION: Improving congestive changes since previous study.   Electronically Signed   By: Lucienne Capers M.D.   On: 05/27/2014 02:42   Ct Angio Chest Pe W/cm &/or Wo Cm  06/06/2014   CLINICAL DATA:  Shortness of breath, evaluate for pneumonia or kidney.  EXAM: CT ANGIOGRAPHY  CHEST  CT ABDOMEN AND PELVIS WITH CONTRAST  TECHNIQUE: Multidetector CT imaging of the chest was performed using the standard protocol during bolus administration of intravenous contrast. Multiplanar CT image reconstructions and MIPs were obtained to evaluate the vascular anatomy. Multidetector CT imaging of the abdomen and pelvis was performed using the standard protocol during bolus administration of intravenous contrast.  CONTRAST:  180mL OMNIPAQUE IOHEXOL 350 MG/ML SOLN  COMPARISON:  CT abdomen pelvis dated 05/28/2014.  FINDINGS: CTA CHEST FINDINGS  No evidence of pulmonary embolism.  Interlobular septal thickening, upper lobe predominant, suspicious for mild interstitial edema. Associated cardiomegaly with small bilateral pleural effusions, right greater than left.  Patchy right lower lobe opacity (series 4/ image 67), atelectasis versus pneumonia. Lingular and left lower lobe opacity, likely atelectasis.  No suspicious pulmonary nodules. Underlying moderate centrilobular and paraseptal emphysematous changes. No pneumothorax.  Visualized right thyroid is mildly nodular.  Coronary atherosclerosis. Atherosclerotic calcifications of the aortic  arch.  Thoracic lymphadenopathy, including:  --10 mm short axis right paratracheal node (series 4/ image 28)  --12 mm short axis prevascular node (series 4/ image 35)  --14 mm short axis right paratracheal node (series 4/ image 38)  --16 mm short axis left hilar node (series 4/image 50)  --15 mm short axis subcarinal node (series 4/ image 51)  --11 mm short axis right perihilar node (series 4/ image 52)  Degenerative changes of the visualized thoracic spine.  CT ABDOMEN and PELVIS FINDINGS  Liver is notable for a 1.7 x 1.1 cm hypoenhancing lesion in the lateral segment left hepatic lobe (series 44/WNUUV 20), of uncertain etiology, but likely present on unenhanced CT from 2013.  Spleen is within the upper limits of normal for size.  Pancreas and right adrenal gland are within normal limits. Mild nodular thickening of the left adrenal gland, without discrete mass.  Gallbladder is mildly distended, without associated inflammatory changes. No intrahepatic or extrahepatic ductal dilatation.  Status post a right nephrectomy. 2.1 cm lateral interpolar left renal cyst (series 11/ image 38). Additional small left renal cyst. No hydronephrosis.  No evidence of bowel obstruction. Colonic diverticulosis, particularly involving the sigmoid colon. Mild pericolonic stranding/fluid along the distal sigmoid colon (series 11/image 74), raising the possibility of mild sigmoid diverticulitis. No drainable fluid collection/abscess. No free air.  Associated patent fistulous track between sigmoid colon and vaginal cuff in this patient status post hysterectomy (series 11/ images 75-80).  No adnexal masses.  Bladder is mildly thick-walled but underdistended.  Atherosclerotic calcifications of the abdominal aorta and branch vessels.  No abdominopelvic ascites.  No suspicious abdominopelvic lymphadenopathy.  Mild degenerative changes of the lumbar spine.  Review of the MIP images confirms the above findings.  IMPRESSION: No evidence of  pulmonary embolism.  Cardiomegaly with mild interstitial edema and small bilateral pleural effusions, right greater than left.  Patchy right lower lobe opacity, atelectasis versus pneumonia. Lingular and left lower lobe opacity, likely atelectasis.  Thoracic lymphadenopathy, possibly reactive, although indeterminate.  Possible mild sigmoid diverticulitis. No drainable fluid collection/abscess. No free air.  Associated patent fistulous track between the sigmoid colon and vaginal cuff.   Electronically Signed   By: Julian Hy M.D.   On: 06/06/2014 21:02   Ct Abdomen Pelvis W Contrast  06/06/2014   CLINICAL DATA:  Shortness of breath, evaluate for pneumonia or kidney.  EXAM: CT ANGIOGRAPHY CHEST  CT ABDOMEN AND PELVIS WITH CONTRAST  TECHNIQUE: Multidetector CT imaging of the chest was performed using the standard protocol during bolus administration  of intravenous contrast. Multiplanar CT image reconstructions and MIPs were obtained to evaluate the vascular anatomy. Multidetector CT imaging of the abdomen and pelvis was performed using the standard protocol during bolus administration of intravenous contrast.  CONTRAST:  12mL OMNIPAQUE IOHEXOL 350 MG/ML SOLN  COMPARISON:  CT abdomen pelvis dated 05/28/2014.  FINDINGS: CTA CHEST FINDINGS  No evidence of pulmonary embolism.  Interlobular septal thickening, upper lobe predominant, suspicious for mild interstitial edema. Associated cardiomegaly with small bilateral pleural effusions, right greater than left.  Patchy right lower lobe opacity (series 4/ image 67), atelectasis versus pneumonia. Lingular and left lower lobe opacity, likely atelectasis.  No suspicious pulmonary nodules. Underlying moderate centrilobular and paraseptal emphysematous changes. No pneumothorax.  Visualized right thyroid is mildly nodular.  Coronary atherosclerosis. Atherosclerotic calcifications of the aortic arch.  Thoracic lymphadenopathy, including:  --10 mm short axis right  paratracheal node (series 4/ image 28)  --12 mm short axis prevascular node (series 4/ image 35)  --14 mm short axis right paratracheal node (series 4/ image 38)  --16 mm short axis left hilar node (series 4/image 50)  --15 mm short axis subcarinal node (series 4/ image 51)  --11 mm short axis right perihilar node (series 4/ image 52)  Degenerative changes of the visualized thoracic spine.  CT ABDOMEN and PELVIS FINDINGS  Liver is notable for a 1.7 x 1.1 cm hypoenhancing lesion in the lateral segment left hepatic lobe (series 90/WIOXB 20), of uncertain etiology, but likely present on unenhanced CT from 2013.  Spleen is within the upper limits of normal for size.  Pancreas and right adrenal gland are within normal limits. Mild nodular thickening of the left adrenal gland, without discrete mass.  Gallbladder is mildly distended, without associated inflammatory changes. No intrahepatic or extrahepatic ductal dilatation.  Status post a right nephrectomy. 2.1 cm lateral interpolar left renal cyst (series 11/ image 38). Additional small left renal cyst. No hydronephrosis.  No evidence of bowel obstruction. Colonic diverticulosis, particularly involving the sigmoid colon. Mild pericolonic stranding/fluid along the distal sigmoid colon (series 11/image 74), raising the possibility of mild sigmoid diverticulitis. No drainable fluid collection/abscess. No free air.  Associated patent fistulous track between sigmoid colon and vaginal cuff in this patient status post hysterectomy (series 11/ images 75-80).  No adnexal masses.  Bladder is mildly thick-walled but underdistended.  Atherosclerotic calcifications of the abdominal aorta and branch vessels.  No abdominopelvic ascites.  No suspicious abdominopelvic lymphadenopathy.  Mild degenerative changes of the lumbar spine.  Review of the MIP images confirms the above findings.  IMPRESSION: No evidence of pulmonary embolism.  Cardiomegaly with mild interstitial edema and small  bilateral pleural effusions, right greater than left.  Patchy right lower lobe opacity, atelectasis versus pneumonia. Lingular and left lower lobe opacity, likely atelectasis.  Thoracic lymphadenopathy, possibly reactive, although indeterminate.  Possible mild sigmoid diverticulitis. No drainable fluid collection/abscess. No free air.  Associated patent fistulous track between the sigmoid colon and vaginal cuff.   Electronically Signed   By: Julian Hy M.D.   On: 06/06/2014 21:02   Dg Chest Port 1 View  06/20/2014   CLINICAL DATA:  Followup CHF versus fluid overload.  EXAM: PORTABLE CHEST - 1 VIEW  COMPARISON:  Portable chest x-rays yesterday and dating back to 06/08/2014.  FINDINGS: Interval near complete resolution of the previously identified interstitial and airspace pulmonary edema since yesterday. Stable small bilateral pleural effusions, left greater than right, at with improved aeration in the lung bases. No new pulmonary parenchymal  abnormalities. Cardiac silhouette moderately enlarged but stable.  IMPRESSION: Near complete resolution of CHF, with only mild interstitial pulmonary edema persisting. Stable bilateral pleural effusions, left greater than right. Improved aeration in the lung bases, with mild atelectasis persisting. No new abnormalities.   Electronically Signed   By: Evangeline Dakin M.D.   On: 06/20/2014 15:47   Dg Chest Port 1 View  06/19/2014   CLINICAL DATA:  Followup pulmonary edema.  EXAM: PORTABLE CHEST - 1 VIEW  COMPARISON:  Yesterday.  FINDINGS: The cardiac silhouette remains borderline enlarged. Mild increase in prominence of the pulmonary vasculature and interstitial markings with Kerley lines. Mildly increased airspace opacity on the left and mildly improved airspace opacity on the right. Small bilateral pleural effusions.  IMPRESSION: Overall little change in changes of congestive heart failure.   Electronically Signed   By: Enrique Sack M.D.   On: 06/19/2014 07:41   Dg  Chest Port 1 View  06/10/2014   CLINICAL DATA:  Pulmonary edema.  EXAM: PORTABLE CHEST - 1 VIEW  COMPARISON:  06/08/2014.  FINDINGS: Cardiopericardial silhouette upper limits of normal for projection. Aortic arch atherosclerosis. Unchanged RIGHT IJ central line. Interstitial pulmonary edema appears similar to the prior exam. Alveolar edema is present at the LEFT base, with small LEFT pleural effusion. RIGHT costophrenic angle excluded from view.  IMPRESSION: Unchanged moderate CHF.   Electronically Signed   By: Dereck Ligas M.D.   On: 06/10/2014 21:21   Dg Chest Portable 1 View  06/08/2014   CLINICAL DATA:  Central line placement.  EXAM: PORTABLE CHEST - 1 VIEW  COMPARISON:  CT chest 06/06/2014.  Chest x-ray 06/06/2014.  FINDINGS: Right IJ line noted with tip projected over superior vena cava. Mediastinum and hilar structures normal. Cardiomegaly with pulmonary vascular prominence and diffuse interstitial prominence is with congestive heart for pleural interstitial edema. Small left pleural effusion. No pneumothorax. Surgical clips noted in the neck.  IMPRESSION: 1. Right IJ catheter noted with tip projected over superior vena cava. 2. Congestive heart failure with pulmonary edema and small left pleural effusion.   Electronically Signed   By: Marcello Moores  Register   On: 06/08/2014 15:08   Dg Chest Portable 1 View  06/06/2014   CLINICAL DATA:  Abdominal pain and shortness of breath  EXAM: PORTABLE CHEST - 1 VIEW  COMPARISON:  05/27/2014  FINDINGS: Cardiac shadow is stable in appearance. Increasing infiltrate is noted in the medial right lung base as well as in the lateral left lung base. Diffuse interstitial changes remain with mild interstitial edema.  IMPRESSION: Increasing bibasilar infiltrates.   Electronically Signed   By: Inez Catalina M.D.   On: 06/06/2014 16:03    Microbiology: No results found for this or any previous visit (from the past 240 hour(s)).   Labs: Basic Metabolic Panel:  Recent  Labs Lab 06/18/14 1755 06/19/14 0815 06/20/14 0837 06/21/14 0230 06/22/14 0801  NA 143 143 141 141 133*  K 3.8 4.6 3.5* 4.1 5.1  CL 101 102 100 101 96  CO2 30 29 27 30 23   GLUCOSE 101* 98 96 99 100*  BUN 11 15 13 13  29*  CREATININE 2.99* 4.09* 3.18* 3.31* 5.18*  CALCIUM 8.6 8.7 8.3* 8.6 9.2  MG  --  2.0  --   --   --   PHOS 3.7 5.8* 4.4  --  5.7*   Liver Function Tests:  Recent Labs Lab 06/18/14 1755 06/19/14 0815 06/20/14 0837 06/22/14 0801  ALBUMIN 3.2* 3.1* 2.8* 2.6*  No results found for this basename: LIPASE, AMYLASE,  in the last 168 hours No results found for this basename: AMMONIA,  in the last 168 hours CBC:  Recent Labs Lab 06/20/14 0221 06/21/14 0230 06/22/14 0455 06/22/14 0801 06/23/14 0640  WBC 9.0 7.8 9.2 8.6 9.8  HGB 10.6* 11.2* 11.1* 11.0* 10.6*  HCT 36.8 39.1 37.1 38.0 37.3  MCV 101.7* 101.3* 99.5 98.4 100.5*  PLT 141* 120* 143* 127* 119*   Cardiac Enzymes:  Recent Labs Lab 06/18/14 1350 06/18/14 1735 06/18/14 2316  TROPONINI <0.30 <0.30 <0.30   BNP: BNP (last 3 results)  Recent Labs  08/27/13 2220 04/19/14 0410 06/06/14 1620  PROBNP 14565.0* 30133.0* >70000.0*   CBG:  Recent Labs Lab 06/22/14 1226 06/22/14 1623 06/22/14 2146 06/23/14 0633 06/23/14 1341  GLUCAP 110* 96 105* 83 86       Signed:  Linton Stolp  [facility] 06/23/2014, 2:57 PM

## 2014-06-23 NOTE — Progress Notes (Signed)
ANTICOAGULATION CONSULT NOTE - Follow Up Consult  Pharmacy Consult for Warfarin Indication: atrial fibrillation  Allergies  Allergen Reactions  . Lipitor [Atorvastatin] Other (See Comments)    Causes weakness and drowsiness  . Morphine And Related Nausea And Vomiting  . Nsaids Other (See Comments)    Unknown reported by previous hospital    Patient Measurements: Height: 5\' 4"  (162.6 cm) Weight: 156 lb 1.4 oz (70.8 kg) IBW/kg (Calculated) : 54.7 Heparin Dosing Weight: 69 kg  Vital Signs: Temp: 97.6 F (36.4 C) (09/25 1151) Temp src: Oral (09/25 0558) BP: 143/67 mmHg (09/25 1230) Pulse Rate: 59 (09/25 1230)  Labs:  Recent Labs  06/21/14 0230 06/21/14 1200 06/22/14 0455 06/22/14 0801 06/22/14 0805 06/23/14 0640 06/23/14 0900  HGB 11.2*  --  11.1* 11.0*  --  10.6*  --   HCT 39.1  --  37.1 38.0  --  37.3  --   PLT 120*  --  143* 127*  --  119*  --   LABPROT 14.2  --   --   --   --   --  14.2  INR 1.10  --   --   --   --   --  1.10  HEPARINUNFRC 0.36 0.25*  --   --  0.27*  --   --   CREATININE 3.31*  --   --  5.18*  --   --   --     Estimated Creatinine Clearance: 9.5 ml/min (by C-G formula based on Cr of 5.18).   Assessment: 49 YOF who was resumed on heparin post cardiac cath on 9/23 for hx Afib; but heparin stopped and now on Coumadin only. CBC stable - no overt s/sx of bleeding noted.  Pharmacy also consulted to start warfarin today for oral anticoagulation for Afib. Per Dr. Broadus John - the patient has no planned surgery currently and okay to start this evening. Baseline INR 1.1, alb 2.6, on amiodarone which is known to increase warfarin sensitivity.   Today's INR unchanged after 1 dose of warfarin.  Goal of Therapy:  Heparin level 0.3-0.7 units/ml Monitor platelets by anticoagulation protocol: Yes   Plan:  1. Warfarin 5 mg x 1 dose at 1800 today 2. Warfarin book/video 3. Daily PT/INR  [name], BCPS  Clinical Pharmacist Pager [phone]  06/23/2014 1:37 PM

## 2014-06-23 NOTE — Progress Notes (Signed)
[facility] PROGRESS NOTE  [name] QMG:[id number] DOB: 1940-11-19 DOA: 06/18/2014 PCP: Gilford Rile, MD  PCCM transfer 9/23  Assessment/Plan: 1. Pulm Edema -improved with extra HD -Volume overload and new wall motion abnormalities on ECHO, EF 40% -Cards following -s/p R and LHC 9/23, with moderate CAD involving all 3 vessels -medical management recommended, continue ASA/Metoprolol  2. AFib -in NSR now, rate controlled, stopped IV heaprin -started Coumadin -on amiodarone 400mg  BID for 1 week then daily -Coumadin is only option for anticoagulation given ESRD, but also supposed to have Surgery on RV fistula   3. ESRD -HD per Renal  4. Recent Cdiff -completed Vanc course, last PCR negative on 9/8 -with worsening diarrhea now, recheck Cdiff PCR  5. 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 DM -continue SSI  Code Status: Full Code Family Communication: none at bedside Disposition Plan: Home VS SNF   Consultants:  Cards  Renal  HPI/Subjective: Feels ok, breathing almost at baseline, diarrhea worsening again  Objective: Filed Vitals:   06/23/14 0558  BP: 134/48  Pulse: 66  Temp: 97.6 F (36.4 C)  Resp: 20    Intake/Output Summary (Last 24 hours) at 06/23/14 0756 Last data filed at 06/22/14 2157  Gross per 24 hour  Intake    360 ml  Output   1422 ml  Net  -1062 ml   Filed Weights   06/22/14 0422 06/22/14 0746 06/23/14 0558  Weight: 69.9 kg (154 lb 1.6 oz) 71.4 kg (157 lb 6.5 oz) 69.809 kg (153 lb 14.4 oz)    Exam:   General:  AAOx3, no distress  Cardiovascular: S1S2/RRR  Respiratory: diminished BS at bases  Abdomen: soft, Nt, BS present  Musculoskeletal: no edema c/c  Data Reviewed: Basic Metabolic Panel:  Recent  Labs Lab 06/18/14 1755 06/19/14 0815 06/20/14 0837 06/21/14 0230 06/22/14 0801  NA 143 143 141 141 133*  K 3.8 4.6 3.5* 4.1 5.1  CL 101 102 100 101 96  CO2 30 29 27 30 23   GLUCOSE 101* 98 96 99 100*  BUN 11 15 13 13  29*  CREATININE 2.99* 4.09* 3.18* 3.31* 5.18*  CALCIUM 8.6 8.7 8.3* 8.6 9.2  MG  --  2.0  --   --   --   PHOS 3.7 5.8* 4.4  --  5.7*   Liver Function Tests:  Recent Labs Lab 06/18/14 1755 06/19/14 0815 06/20/14 0837 06/22/14 0801  ALBUMIN 3.2* 3.1* 2.8* 2.6*   No results found for this basename: LIPASE, AMYLASE,  in the last 168 hours No results found for this basename: AMMONIA,  in the last 168 hours CBC:  Recent Labs Lab 06/20/14 0221 06/21/14 0230 06/22/14 0455 06/22/14 0801 06/23/14 0640  WBC 9.0 7.8 9.2 8.6 9.8  HGB 10.6* 11.2* 11.1* 11.0* 10.6*  HCT 36.8 39.1 37.1 38.0 37.3  MCV 101.7* 101.3* 99.5 98.4 100.5*  PLT 141* 120* 143* 127* 119*   Cardiac Enzymes:  Recent Labs Lab 06/18/14 1350 06/18/14 1735 06/18/14 2316  TROPONINI <0.30 <0.30 <0.30   BNP (last 3 results)  Recent Labs  08/27/13 2220 04/19/14 0410 06/06/14 1620  PROBNP 14565.0* 30133.0* >70000.0*   CBG:  Recent Labs Lab 06/22/14 0610 06/22/14 1226 06/22/14 1623 06/22/14 2146 06/23/14 0633  GLUCAP 79 110* 96 105*  83    No results found for this or any previous visit (from the past 240 hour(s)).   Studies: No results found.  Scheduled Meds: . amiodarone  400 mg Oral BID  . aspirin EC  81 mg Oral Daily  . calcitRIOL  0.25 mcg Oral Q M,W,F-HD  . calcium acetate  2,001 mg Oral TID WC  . darbepoetin (ARANESP) injection - DIALYSIS  60 mcg Intravenous Q Wed-HD  . insulin aspart  0-9 Units Subcutaneous TID WC  . ipratropium-albuterol  3 mL Nebulization TID  . metoprolol tartrate  25 mg Oral BID  . rOPINIRole  1 mg Oral BID AC & HS  . saccharomyces boulardii  250 mg Oral BID  . sodium chloride  3 mL Intravenous Q12H  . warfarin   Does not apply Once  .  Warfarin - Pharmacist Dosing Inpatient   Does not apply q1800   Continuous Infusions:   Antibiotics Given (last 72 hours)   None      Active Problems:   Acute respiratory failure   Atrial fibrillation with RVR   Hypotension arterial   CAD (coronary artery disease)   DCM (dilated cardiomyopathy)    Time spent: 34min    [facility] Pager [phone]. If 7PM-7AM, please contact night-coverage at [email or web address], password [email or web address] 06/23/2014, 7:56 AM  LOS: 5 days

## 2014-06-23 NOTE — Evaluation (Addendum)
Physical Therapy Evaluation Patient Details Name: [name] MRN: [id number] DOB: Nov 27, 1940 Today's Date: 06/23/2014   History of Present Illness  73 y.o. female with h/o ESRD on HD, DM, COPD admitted with N/V, loose stools, cough, SOB. Dx of fluid overload, PNA, c-diff, acute respiratory failure.   Clinical Impression  Pt admitted with above. Pt currently with functional limitations due to the deficits listed below (see PT Problem List). Pt will need to use RW at all times on d/c and will need HHPT f/u.  Pt agrees.   Pt will benefit from skilled PT to increase their independence and safety with mobility to allow discharge to the venue listed below.    Follow Up Recommendations Home health PT;Supervision/Assistance - 24 hour    Equipment Recommendations  None recommended by PT    Recommendations for Other Services       Precautions / Restrictions Precautions Precautions: Fall Restrictions Weight Bearing Restrictions: No      Mobility  Bed Mobility Overal bed mobility: Independent                Transfers Overall transfer level: Independent                  Ambulation/Gait Ambulation/Gait assistance: Min guard Ambulation Distance (Feet): 150 Feet Assistive device: Rolling walker (2 wheeled) Gait Pattern/deviations: Step-through pattern;Decreased stride length;Ataxic   Gait velocity interpretation: Below normal speed for age/gender General Gait Details: Pt with slight unsteadiness losing balance occasionally to the right but did not lose balance significantly.  Can maintain balance on her own with the RW.  Instructed pt to use RW at all times on d/c for safety.  did have to cue her x2 to stay close to RW especially with turns.    Stairs            Wheelchair Mobility    Modified Rankin (Stroke Patients Only)       Balance Overall balance assessment: Needs assistance;History of Falls Sitting-balance support: No upper extremity supported;Feet  supported Sitting balance-Leahy Scale: Fair   Postural control: Right lateral lean Standing balance support: Bilateral upper extremity supported;During functional activity Standing balance-Leahy Scale: Fair Standing balance comment: can stand statically without UE support for seconds at a time.  Seems to rely on RW for any dynamic standing activity.              High level balance activites: Turns;Direction changes High Level Balance Comments: Needed min guard asssist and cues for turns with rW.               Pertinent Vitals/Pain Pain Assessment: No/denies pain VSS with exception of O2 sat 85% on 3L with ambulation and up to 90% on 4L with ambulation.  Once at rest, O2 92% on 3L.  Pt at 82% on RA.      Home Living Family/patient expects to be discharged to:: Private residence Living Arrangements: Children;Other (Comment) (lives with daughter) Available Help at Discharge: Family;Available PRN/intermittently Type of Home: House Home Access: Stairs to enter Entrance Stairs-Rails: Right Entrance Stairs-Number of Steps: 5 Home Layout: One level Home Equipment: Walker - 2 wheels;Walker - 4 wheels Additional Comments: has home O2, was on 3L continuous    Prior Function Level of Independence: Independent with assistive device(s)         Comments: Had been using RW at home per pt due to feeling weak.       Hand Dominance        Extremity/Trunk Assessment  Upper Extremity Assessment: Defer to OT evaluation           Lower Extremity Assessment: Generalized weakness         Communication   Communication: No difficulties  Cognition Arousal/Alertness: Awake/alert Behavior During Therapy: WFL for tasks assessed/performed Overall Cognitive Status: Within Functional Limits for tasks assessed                      General Comments      Exercises        Assessment/Plan    PT Assessment Patient needs continued PT services  PT Diagnosis  Generalized weakness   PT Problem List Decreased balance;Decreased strength;Decreased activity tolerance;Decreased mobility;Decreased knowledge of use of DME;Decreased safety awareness;Decreased knowledge of precautions  PT Treatment Interventions DME instruction;Gait training;Functional mobility training;Therapeutic activities;Therapeutic exercise;Balance training;Patient/family education   PT Goals (Current goals can be found in the Care Plan section) Acute Rehab PT Goals Patient Stated Goal: to go to granddaughters wedding tomorrow. PT Goal Formulation: With patient Time For Goal Achievement: 06/30/14 Potential to Achieve Goals: Good    Frequency Min 3X/week   Barriers to discharge        Co-evaluation               End of Session Equipment Utilized During Treatment: Gait belt;Oxygen Activity Tolerance: Patient limited by fatigue Patient left: in chair;with call bell/phone within reach Nurse Communication: Mobility status         Time: 6415-8309 PT Time Calculation (min): 22 min   Charges:   PT Evaluation $Initial PT Evaluation Tier I: 1 Procedure PT Treatments $Gait Training: 8-22 mins   PT G Codes:          INGOLD,[name] 2014/07/02, 9:21 AM  [facility] [phone] (pager)

## 2014-06-23 NOTE — Progress Notes (Signed)
TELEMETRY: Reviewed telemetry pt in NSR, rate 60-70s: Filed Vitals:   06/22/14 1522 06/22/14 2047 06/22/14 2104 06/23/14 0558  BP: 143/49  162/41 134/48  Pulse: 71  70 66  Temp: 98.6 F (37 C)  98.1 F (36.7 C) 97.6 F (36.4 C)  TempSrc: Oral  Oral Oral  Resp: 26  20 20   Height:      Weight:    153 lb 14.4 oz (69.809 kg)  SpO2: 98% 96% 99% 94%    Intake/Output Summary (Last 24 hours) at 06/23/14 6283 Last data filed at 06/22/14 2157  Gross per 24 hour  Intake    360 ml  Output   1422 ml  Net  -1062 ml   Filed Weights   06/22/14 0422 06/22/14 0746 06/23/14 0558  Weight: 154 lb 1.6 oz (69.9 kg) 157 lb 6.5 oz (71.4 kg) 153 lb 14.4 oz (69.809 kg)    Subjective Feels much better. Some SOB today. Had some diarrhea yesterday.  Marland Kitchen amiodarone  400 mg Oral BID  . aspirin EC  81 mg Oral Daily  . calcitRIOL  0.25 mcg Oral Q M,W,F-HD  . calcium acetate  2,001 mg Oral TID WC  . darbepoetin (ARANESP) injection - DIALYSIS  60 mcg Intravenous Q Wed-HD  . insulin aspart  0-9 Units Subcutaneous TID WC  . ipratropium-albuterol  3 mL Nebulization TID  . metoprolol tartrate  25 mg Oral BID  . rOPINIRole  1 mg Oral BID AC & HS  . saccharomyces boulardii  250 mg Oral BID  . sodium chloride  3 mL Intravenous Q12H  . warfarin   Does not apply Once  . Warfarin - Pharmacist Dosing Inpatient   Does not apply q1800      LABS: Basic Metabolic Panel:  Recent Labs  06/20/14 0837 06/21/14 0230 06/22/14 0801  NA 141 141 133*  K 3.5* 4.1 5.1  CL 100 101 96  CO2 27 30 23   GLUCOSE 96 99 100*  BUN 13 13 29*  CREATININE 3.18* 3.31* 5.18*  CALCIUM 8.3* 8.6 9.2  PHOS 4.4  --  5.7*   Liver Function Tests:  Recent Labs  06/20/14 0837 06/22/14 0801  ALBUMIN 2.8* 2.6*   No results found for this basename: LIPASE, AMYLASE,  in the last 72 hours CBC:  Recent Labs  06/22/14 0801 06/23/14 0640  WBC 8.6 9.8  HGB 11.0* 10.6*  HCT 38.0 37.3  MCV 98.4 100.5*  PLT 127* 119*    Cardiac Enzymes: No results found for this basename: CKTOTAL, CKMB, CKMBINDEX, TROPONINI,  in the last 72 hours BNP: No results found for this basename: PROBNP,  in the last 72 hours D-Dimer: No results found for this basename: DDIMER,  in the last 72 hours Hemoglobin A1C: No results found for this basename: HGBA1C,  in the last 72 hours Fasting Lipid Panel: No results found for this basename: CHOL, HDL, LDLCALC, TRIG, CHOLHDL, LDLDIRECT,  in the last 72 hours Thyroid Function Tests: No results found for this basename: TSH, T4TOTAL, FREET3, T3FREE, THYROIDAB,  in the last 72 hours   Radiology/Studies:  No results found. Ecg: NSR with LBBB  Echo:Study Conclusions  - Procedure narrative: Transthoracic echocardiography. Image quality was adequate. The study was technically difficult. - Left ventricle: Hypokinesis of the inferior septum, inferior wall, and inferolateral wall. This is new since the study of 2014. The cavity size was normal. Wall thickness was increased in a pattern of moderate LVH. The estimated ejection fraction was 40%. -  Aortic valve: Sclerosis without stenosis. There was trivial regurgitation. - Mitral valve: Over time there has been some increase in the mitral inflow gradients. There may be mild functional mitral stenosis. There was mild regurgitation. - Left atrium: The atrium was moderately dilated. - Right ventricle: Systolic function was mildly reduced. - Impressions: Large pleural effusion.  Impressions:  - Large pleural effusion.  Left and Right Heart Catheterization with Coronary Angiography, Abdominal Aortography, and Right Femoral Angiography Report  [name]  73 y.o.  female  11-21-1940  Procedure Date: 06/21/2014  Referring Physician: Mertie Moores, M.D.  Primary Cardiologist: Fransico Him, M.D.  INDICATIONS: Chest discomfort in setting of atrial fibrillation with rapid ventricular response. Prior history of coronary stenting involving  the LAD  PROCEDURE: 1. Left heart catheterization; 2. Coronary angiography; 3. Right heart catheterization; 4. Distal abdominal aortic aortography; 5. Right femoral angiography  CONSENT:  The risks, benefits, and details of the procedure were explained in detail to the patient. Risks including death, stroke, heart attack, kidney injury, allergy, limb ischemia, bleeding and radiation injury were discussed. The patient verbalized understanding and wanted to proceed. Informed written consent was obtained.  PROCEDURE TECHNIQUE: After Xylocaine anesthesia a 5 French arterial sheath was placed in the right femoral artery using the modified Seldinger technique. A 7 French sheath was placed in the right femoral vein using the modified Seldinger technique. Right heart catheterization was performed using a 7 French balloon tipped Swan-Ganz catheter. An oximetry sample was obtained in the main pulmonary artery. Heart catheterization was performed via the right femoral artery we could not advance the guidewire beyond the origin of the iliac. Multiple wires were used we will unable to cross the total occlusion. Angiography documented occlusion of the right iliac ostium. We then switched to the left femoral approach where a 5 French sheath was placed after local anesthesia. We will able to advance a guidewire with some difficulty into the descending aorta. Coronary angiography was then performed using a 5 [place] A2 multipurpose catheter. Hemodynamic recordings and a central aortic oximetry sample was obtained with this cath. Left ventriculography was performed by hand injection using the multipurpose catheter.. We used a 5 French 4 cm Judkins right 4 right coronary angiography. There was mild catheter damping with engagement but reflux of contrast into the aorta during injection.  At the completion of angiography the digital images were reviewed and the case was terminated.  CONTRAST: Total of 100 cc.  COMPLICATIONS: None    HEMODYNAMICS: Aortic pressure 150/54 mmHg; LV pressure 154/18 mmHg; LVEDP 17 mm mercury; RA 5 mm mercury; RV 49/10 mmHg; PA 49/22 mmHg; PCWP(mean) 19 mm mercury; Cardiac Output 6.4 L per minute by Fick and 5.15 L per minute by thermodilution; mean mitral valve gradient 8 mm mercury; mitral valve area 2.43 cm square; AV gradient 0  ANGIOGRAPHIC DATA: The left main coronary artery is calcified but widely patent..  The left anterior descending artery is patent and contains a proximal stent. Proximal to the stent there is segmental 50% narrowing. There is ostial 40-50% LAD narrowing. The first diagonal arises from within the stented segment and contains ostial and proximal 80-90% obstruction.  The left circumflex artery is patent. The first obtuse marginal before bifurcating into 2 significant branches, contains segmental 50-70% narrowing. The mid circumflex contains 30 -50% narrowing before ending one to small to moderate sized distal obtuse marginal branches..  The right coronary artery is contains a heavily calcified ostium and right sinus of Valsalva. There is 50-70%  ostial narrowing. Proximal to mid segmental stenosis between 50 and 70%. PDA is noted distally. No high-grade obstruction is noted in the RCA.  ABDOMINAL AORTOGRAPHY: Documented a total occlusion of the right iliac at the bifurcation. Significant distal aortic plaquing as noted above the bifurcation. There is up to 50% obstruction of the left iliac. Both iliacs a heavily calcified.  RIGHT ILIAC ANGIOGRAPHY: Totally occluded at its origin from the distal aorta. Heavy calcification is noted circumferentially in the right iliac.  LEFT VENTRICULOGRAM: Left ventricular angiogram was done in the 30 RAO projection and revealed normal LV cavity size with asymmetric contractile pattern and an estimated ejection fraction of 40-50%. The mitral valve and annulus the heavily calcified. The aortic valve contains moderate calcification.  IMPRESSIONS: 1.  Moderate coronary artery disease involving all 3 vessels including 50% proximal LAD, 50-70% first obtuse marginal, 70% ostial RCA, and 50-70% segmental proximal RCA. The stent in the proximal LAD is widely patent with 80% stenosis noted in the ostium and proximal portion of the jailed first diagonal.  2. Mildly depressed LV systolic function with an estimated ejection fraction of 40-50%.  3. 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  4. Distal abdominal aortic plaquing.  5. Mitral valve area 2.4 cm square.  6. Moderate pulmonary hypertension  RECOMMENDATION: Risk factor modification.  Dialysis to lower filling pressures.  Beta blocker therapy as needed for functional mitral stenosis.   PHYSICAL EXAM General: Overweight,elderly WF in NAD Head: Normocephalic, atraumatic, sclera non-icteric, oropharynx is clear Neck: Negative for carotid bruits. JVD not elevated. No adenopathy Lungs: Clear bilaterally to auscultation without wheezes, rales, or rhonchi. Breathing is unlabored. Heart: RRR S1 S2 without murmurs, rubs, or gallops.  Abdomen: Soft, non-tender, non-distended with normoactive bowel sounds. No hepatomegaly. No rebound/guarding. No obvious abdominal masses. Extremities: No clubbing, cyanosis or edema.  Neuro: Alert and oriented X 3. Moves all extremities spontaneously. Psych:  Responds to questions appropriately with a normal affect.  ASSESSMENT AND PLAN: 1. Acute respiratory failure with hypoxia. Clinically much improved with dialysis and correction of volume excess. CXR improved. Tolerated dialysis well. Weight is down 10 lbs since admission.  2. Acute combined systolic/diastolic CHF. LBBB. EF 40-50%. Correction of volume excess with dialysis. Started  metoprolol for rate control.   3. Atrial fibrillation. Converted to NSR on amiodarone. 400 mg bid. With diarrhea will reduce to once a day. Now starting on Coumadin for long term  anticoagulation. She is concerned about taking coumadin due to history of anemia and need for transfusion. She states her cardiologist in [place] told her not to take coumadin but it is unclear why. She has had extensive GI evaluation in the past without source of bleeding. Will shoot for low therapeutic INR 2-2.5. If she does have bleeding will need to stop coumadin.  4. ESRD on dialysis.   5. Hyperlipidemia. Statin intolerant in past. On Zetia.   6. S/p bilateral CEA.  7. HTN  8. CAD with remote stent of LAD. Moderate diffuse 3 vessel disease. Will treat medically.  9. DM  10. COPD oxygen dependent.  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  12. Moderate pulmonary HTN  13. Functional mitral stenosis-mild.   Present on Admission:  . Acute respiratory failure . CAD (coronary artery disease)  Signed, Peter Martinique, [place] 06/23/2014 8:22 AM

## 2014-06-23 NOTE — Procedures (Signed)
I was present at this dialysis session, have reviewed the session itself and made  appropriate changes  Kelly Splinter MD (pgr) [phone]    [phone] 06/23/2014, 2:40 PM

## 2014-06-23 NOTE — Evaluation (Signed)
Occupational Therapy Evaluation Patient Details Name: [name] MRN: [id number] DOB: [birth year] Today's Date: 06/23/2014    History of Present Illness 73 y.o. female with h/o ESRD on HD, DM, COPD admitted with N/V, loose stools, cough, SOB. Dx of fluid overload, PNA, c-diff, acute respiratory failure.    Clinical Impression   Pt admitted with above. Pt will benefit from continued acute OT services to address below problem list.  Pt independent at baseline but does report experiencing progressive weakness in legs and decreased activity tolerance over past few months.  Pt currently requires RW during functional mobility, but with supervision to min guard for safety with use of RW.  Recommend 24/7 supervision/assist at home.   Follow Up Recommendations  Supervision/Assistance - 24 hour    Equipment Recommendations  None recommended by OT    Recommendations for Other Services       Precautions / Restrictions Precautions Precautions: Fall Restrictions Weight Bearing Restrictions: No      Mobility Bed Mobility Overal bed mobility: Independent                Transfers Overall transfer level: Independent                    Balance Overall balance assessment: Needs assistance;History of Falls Sitting-balance support: No upper extremity supported;Feet supported Sitting balance-Leahy Scale: Fair   Postural control: Right lateral lean Standing balance support: Bilateral upper extremity supported;During functional activity Standing balance-Leahy Scale: Fair Standing balance comment: can stand statically without UE support for seconds at a time.  Seems to rely on RW for any dynamic standing activity.              High level balance activites: Turns;Direction changes High Level Balance Comments: Needed min guard asssist and cues for turns with rW.              ADL Overall ADL's : Needs assistance/impaired     Grooming: Wash/dry  hands;Supervision/safety;Standing               Lower Body Dressing: Supervision/safety;Sit to/from stand   Toilet Transfer: Ambulation;RW;Min guard           Functional mobility during ADLs: Rolling walker;Min guard General ADL Comments: Pt c/o feeling weak compared to baseline.  She requires supervision-min guard for safety during ADL tasks, frequent cueing for safety with RW (does not typically use one at home).      Vision                     Perception     Praxis      Pertinent Vitals/Pain Pain Assessment: No/denies pain     Hand Dominance     Extremity/Trunk Assessment Upper Extremity Assessment Upper Extremity Assessment: Overall WFL for tasks assessed   Lower Extremity Assessment Lower Extremity Assessment: Defer to PT evaluation       Communication Communication Communication: No difficulties   Cognition Arousal/Alertness: Awake/alert Behavior During Therapy: WFL for tasks assessed/performed Overall Cognitive Status: Within Functional Limits for tasks assessed                     General Comments       Exercises       Shoulder Instructions      Home Living Family/patient expects to be discharged to:: Private residence Living Arrangements: Children;Other (Comment) (lives with daughter) Available Help at Discharge: Family;Available PRN/intermittently Type of Home: House Home Access: Stairs to enter Entrance  Stairs-Number of Steps: 5 Entrance Stairs-Rails: Right Home Layout: One level     Bathroom Shower/Tub: Teacher, early years/pre: Standard     Home Equipment: Environmental consultant - 2 wheels;Walker - 4 wheels   Additional Comments: has home O2, was on 3L continuous      Prior Functioning/Environment Level of Independence: Independent with assistive device(s)        Comments: Had been using RW at home per pt due to feeling weak.      OT Diagnosis: Generalized weakness   OT Problem List: Decreased  strength;Decreased activity tolerance;Impaired balance (sitting and/or standing);Decreased knowledge of use of DME or AE;Decreased safety awareness   OT Treatment/Interventions: Self-care/ADL training;Energy conservation;DME and/or AE instruction;Therapeutic activities;Patient/family education;Balance training    OT Goals(Current goals can be found in the care plan section) Acute Rehab OT Goals Patient Stated Goal: to go to granddaughters wedding tomorrow. OT Goal Formulation: With patient Time For Goal Achievement: 06/30/14 Potential to Achieve Goals: Good  OT Frequency: Min 2X/week   Barriers to D/C:            Co-evaluation              End of Session Equipment Utilized During Treatment: Gait belt;Rolling walker;Oxygen Nurse Communication: Mobility status  Activity Tolerance: Patient tolerated treatment well Patient left:  (ambulating with PT)   Time: 6720-9470 OT Time Calculation (min): 14 min Charges:  OT General Charges $OT Visit: 1 Procedure OT Evaluation $Initial OT Evaluation Tier I: 1 Procedure OT Treatments $Self Care/Home Management : 8-22 mins G-Codes:    Darrol Jump 06/23/2014, 9:49 AM  06/23/2014 Darrol Jump OTR/L Pager [phone] Office [phone]

## 2014-06-23 NOTE — Progress Notes (Signed)
Subjective:  No complaints  Objective Vital signs in last 24 hours: Filed Vitals:   06/22/14 1522 06/22/14 2047 06/22/14 2104 06/23/14 0558  BP: 143/49  162/41 134/48  Pulse: 71  70 66  Temp: 98.6 F (37 C)  98.1 F (36.7 C) 97.6 F (36.4 C)  TempSrc: Oral  Oral Oral  Resp: 26  20 20   Height:      Weight:    69.809 kg (153 lb 14.4 oz)  SpO2: 98% 96% 99% 94%   Weight change: 1.5 kg (3 lb 4.9 oz)  Physical Exam: General: alert WF NAD on HD pleasnt  Heart: RRR, no mur, rub or gallop Lungs: clear bilat no rales today Abdomen: soft , nt, nd Extremities:no pedal edema Dialysis Access: RUA AVF patent on hd    OP HD: MWF [facility]  4h 72kg 2K/2.25Ca 400/A1.5 P2 AVF RUA No Heparin  Calcitriol 0.25 mcg MWF Aranesp 60 mcg qwk No Venofer   Assessment: 1  Dyspnea / pulm edema - due to lean body wt loss, resolved 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  3  ESRD on HD   4  HTN no meds here or at home, BP's on the low side 5  Anemia cont aranesp 60 /wk  hgb 11 6  HPTH cont vit D, phoslo  7  COPD home O2, nebs    8  Afib converted to SR  on amio, on iv hep and  Coumadin per card   9  DM per primary 10 Cdiff infection has finished oral meds / last Neg  Plan - short HD today  Kelly Splinter MD (pgr) [phone]    ([phone] 06/23/2014, 9:26 AM   Labs: Basic Metabolic Panel:  Recent Labs Lab 06/19/14 0815 06/20/14 0837 06/21/14 0230 06/22/14 0801  NA 143 141 141 133*  K 4.6 3.5* 4.1 5.1  CL 102 100 101 96  CO2 29 27 30 23   GLUCOSE 98 96 99 100*  BUN 15 13 13  29*  CREATININE 4.09* 3.18* 3.31* 5.18*  CALCIUM 8.7 8.3* 8.6 9.2  PHOS 5.8* 4.4  --  5.7*   Liver Function Tests:  Recent Labs Lab 06/19/14 0815 06/20/14 0837 06/22/14 0801  ALBUMIN 3.1* 2.8* 2.6*  CBC:  Recent Labs Lab 06/20/14 0221 06/21/14 0230 06/22/14 0455 06/22/14 0801 06/23/14 0640  WBC 9.0 7.8 9.2 8.6 9.8  HGB 10.6* 11.2* 11.1* 11.0* 10.6*  HCT 36.8 39.1 37.1 38.0 37.3   MCV 101.7* 101.3* 99.5 98.4 100.5*  PLT 141* 120* 143* 127* 119*   Cardiac Enzymes:  Recent Labs Lab 06/18/14 1350 06/18/14 1735 06/18/14 2316  TROPONINI <0.30 <0.30 <0.30   CBG:  Recent Labs Lab 06/22/14 0610 06/22/14 1226 06/22/14 1623 06/22/14 2146 06/23/14 0633  GLUCAP 79 110* 96 105* 83    Studies/Results: No results found. Medications:   . amiodarone  400 mg Oral Daily  . calcitRIOL  0.25 mcg Oral Q M,W,F-HD  . calcium acetate  2,001 mg Oral TID WC  . darbepoetin (ARANESP) injection - DIALYSIS  60 mcg Intravenous Q Wed-HD  . insulin aspart  0-9 Units Subcutaneous TID WC  . ipratropium-albuterol  3 mL Nebulization TID  . metoprolol tartrate  25 mg Oral BID  . rOPINIRole  1 mg Oral BID AC & HS  . saccharomyces boulardii  250 mg Oral BID  . sodium chloride  3 mL Intravenous Q12H  . warfarin   Does not apply Once  . Warfarin -  Pharmacist Dosing Inpatient   Does not apply [phone]

## 2014-06-23 NOTE — Progress Notes (Signed)
Pt stated that she is dizzy and weak after dialysis. VS within normal limits and no resp distress noted.  Dr. Broadus John made aware and instructed nurse to obtain orthostatic VS. Will inform oncoming nurse about pt's conditions.

## 2014-06-23 NOTE — Progress Notes (Signed)
SATURATION QUALIFICATIONS: (This note is used to comply with regulatory documentation for home oxygen)  Patient Saturations on Room Air at Rest = 82%  Patient Saturations on Room Air while Ambulating = 80%  Patient Saturations on 4 Liters of oxygen while Ambulating = 90-92%  Please briefly explain why patient needs home oxygen:Pt desats without O2 and needs 4LO2 to ambulate to keep sats >90%.   Thanks. [facility] [phone] (pager)

## 2014-06-24 LAB — CBC
HCT: 36 % (ref 36.0–46.0)
HEMOGLOBIN: 10.2 g/dL — AB (ref 12.0–15.0)
MCH: 29.6 pg (ref 26.0–34.0)
MCHC: 28.3 g/dL — ABNORMAL LOW (ref 30.0–36.0)
MCV: 104.3 fL — ABNORMAL HIGH (ref 78.0–100.0)
Platelets: 97 10*3/uL — ABNORMAL LOW (ref 150–400)
RBC: 3.45 MIL/uL — ABNORMAL LOW (ref 3.87–5.11)
RDW: 18.5 % — ABNORMAL HIGH (ref 11.5–15.5)
WBC: 8.1 10*3/uL (ref 4.0–10.5)

## 2014-06-24 LAB — BASIC METABOLIC PANEL
ANION GAP: 11 (ref 5–15)
BUN: 23 mg/dL (ref 6–23)
CO2: 28 mEq/L (ref 19–32)
Calcium: 8.8 mg/dL (ref 8.4–10.5)
Chloride: 101 mEq/L (ref 96–112)
Creatinine, Ser: 3.8 mg/dL — ABNORMAL HIGH (ref 0.50–1.10)
GFR, EST AFRICAN AMERICAN: 13 mL/min — AB (ref >90)
GFR, EST NON AFRICAN AMERICAN: 11 mL/min — AB (ref >90)
Glucose, Bld: 79 mg/dL (ref 70–99)
POTASSIUM: 4.9 meq/L (ref 3.7–5.3)
SODIUM: 140 meq/L (ref 137–147)

## 2014-06-24 LAB — PROTIME-INR
INR: 1.08 (ref 0.00–1.49)
PROTHROMBIN TIME: 14 s (ref 11.6–15.2)

## 2014-06-24 LAB — GLUCOSE, CAPILLARY: GLUCOSE-CAPILLARY: 85 mg/dL (ref 70–99)

## 2014-06-24 MED ORDER — IPRATROPIUM-ALBUTEROL 0.5-2.5 (3) MG/3ML IN SOLN: 3.0000 mL | Freq: Four times a day (QID) | RESPIRATORY_TRACT | Status: DC | PRN

## 2014-06-24 NOTE — Progress Notes (Signed)
Subjective:  Was lightheaded on standing after HD last night so dc was held. Feeling back to normal today, has been up on the side of the bed  Objective Vital signs in last 24 hours: Filed Vitals:   06/23/14 1652 06/23/14 2041 06/23/14 2048 06/24/14 0451  BP: 173/96 118/40  137/49  Pulse:  56  52  Temp:  98.2 F (36.8 C)  97.7 F (36.5 C)  TempSrc:  Oral  Oral  Resp:  20  20  Height:      Weight:    70.217 kg (154 lb 12.8 oz)  SpO2:  99% 99% 100%   Weight change: -0.6 kg (-1 lb 5.2 oz)  Physical Exam: General: alert WF NAD on HD pleasnt  Heart: RRR, no mur, rub or gallop Lungs: clear bilat no rales today Abdomen: soft , nt, nd Extremities:no pedal edema Dialysis Access: RUA AVF patent on hd    OP HD: MWF [facility]  4h 72kg 2K/2.25Ca 400/A1.5 P2 AVF RUA No Heparin  Calcitriol 0.25 mcg MWF Aranesp 60 mcg qwk No Venofer   Assessment: 1  Dyspnea / pulm edema - due to lean body wt loss, resolved 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  3  ESRD on HD   4  HTN no meds here or at home, BP's on the low side 5  Anemia cont aranesp 60 /wk  hgb 11 6  HPTH cont vit D, phoslo  7  COPD home O2, nebs    8  Afib converted to SR  on amio, on iv hep and  Coumadin per card   9  DM per primary 10 Cdiff infection has finished oral meds / last Neg  Plan - ok for discharge, set new dry wt at 69 kg  Kelly Splinter MD (pgr) [phone]    ([phone] 06/24/2014, 7:59 AM   Labs: Basic Metabolic Panel:  Recent Labs Lab 06/19/14 0815 06/20/14 0837 06/21/14 0230 06/22/14 0801 06/24/14 0444  NA 143 141 141 133* 140  K 4.6 3.5* 4.1 5.1 4.9  CL 102 100 101 96 101  CO2 29 27 30 23 28   GLUCOSE 98 96 99 100* 79  BUN 15 13 13  29* 23  CREATININE 4.09* 3.18* 3.31* 5.18* 3.80*  CALCIUM 8.7 8.3* 8.6 9.2 8.8  PHOS 5.8* 4.4  --  5.7*  --    Liver Function Tests:  Recent Labs Lab 06/19/14 0815 06/20/14 0837 06/22/14 0801  ALBUMIN 3.1* 2.8* 2.6*  CBC:  Recent Labs Lab  06/21/14 0230 06/22/14 0455 06/22/14 0801 06/23/14 0640 06/24/14 0444  WBC 7.8 9.2 8.6 9.8 8.1  HGB 11.2* 11.1* 11.0* 10.6* 10.2*  HCT 39.1 37.1 38.0 37.3 36.0  MCV 101.3* 99.5 98.4 100.5* 104.3*  PLT 120* 143* 127* 119* 97*   Cardiac Enzymes:  Recent Labs Lab 06/18/14 1350 06/18/14 1735 06/18/14 2316  TROPONINI <0.30 <0.30 <0.30   CBG:  Recent Labs Lab 06/23/14 0633 06/23/14 1341 06/23/14 1748 06/23/14 2132 06/24/14 0644  GLUCAP 83 86 116* 91 85    Studies/Results: No results found. Medications:   . amiodarone  400 mg Oral Daily  . calcitRIOL  0.25 mcg Oral Q M,W,F-HD  . calcium acetate  2,001 mg Oral TID WC  . darbepoetin (ARANESP) injection - DIALYSIS  60 mcg Intravenous Q Wed-HD  . insulin aspart  0-9 Units Subcutaneous TID WC  . ipratropium-albuterol  3 mL Nebulization TID  . metoprolol tartrate  25 mg Oral BID  .  rOPINIRole  1 mg Oral BID AC & HS  . saccharomyces boulardii  250 mg Oral BID  . sodium chloride  3 mL Intravenous Q12H  . warfarin   Does not apply Once  . Warfarin - Pharmacist Dosing Inpatient   Does not apply [phone]

## 2014-06-24 NOTE — Progress Notes (Signed)
TELEMETRY: Reviewed telemetry pt in NSR, rate 55-71 BPM, improved SOB, no chest pain.   Filed Vitals:   06/23/14 2048 06/24/14 0451 06/24/14 0842 06/24/14 0958  BP:  137/49    Pulse:  52  62  Temp:  97.7 F (36.5 C)    TempSrc:  Oral    Resp:  20    Height:      Weight:  154 lb 12.8 oz (70.217 kg)    SpO2: 99% 100% 98%     Intake/Output Summary (Last 24 hours) at 06/24/14 1001 Last data filed at 06/23/14 1430  Gross per 24 hour  Intake    120 ml  Output   1649 ml  Net  -1529 ml   Filed Weights   06/23/14 1151 06/23/14 1430 06/24/14 0451  Weight: 156 lb 1.4 oz (70.8 kg) 152 lb 12.5 oz (69.3 kg) 154 lb 12.8 oz (70.217 kg)    Subjective Feels much better. Some SOB today. Had some diarrhea yesterday.  Marland Kitchen amiodarone  400 mg Oral Daily  . calcitRIOL  0.25 mcg Oral Q M,W,F-HD  . calcium acetate  2,001 mg Oral TID WC  . darbepoetin (ARANESP) injection - DIALYSIS  60 mcg Intravenous Q Wed-HD  . insulin aspart  0-9 Units Subcutaneous TID WC  . ipratropium-albuterol  3 mL Nebulization TID  . metoprolol tartrate  25 mg Oral BID  . rOPINIRole  1 mg Oral BID AC & HS  . saccharomyces boulardii  250 mg Oral BID  . sodium chloride  3 mL Intravenous Q12H  . warfarin   Does not apply Once  . Warfarin - Pharmacist Dosing Inpatient   Does not apply q1800      LABS: Basic Metabolic Panel:  Recent Labs  06/22/14 0801 06/24/14 0444  NA 133* 140  K 5.1 4.9  CL 96 101  CO2 23 28  GLUCOSE 100* 79  BUN 29* 23  CREATININE 5.18* 3.80*  CALCIUM 9.2 8.8  PHOS 5.7*  --    Liver Function Tests:  Recent Labs  06/22/14 0801  ALBUMIN 2.6*   No results found for this basename: LIPASE, AMYLASE,  in the last 72 hours CBC:  Recent Labs  06/23/14 0640 06/24/14 0444  WBC 9.8 8.1  HGB 10.6* 10.2*  HCT 37.3 36.0  MCV 100.5* 104.3*  PLT 119* 97*   Radiology/Studies:  No results found. Ecg: NSR with LBBB  Echo:Study Conclusions  - Procedure narrative: Transthoracic  echocardiography. Image quality was adequate. The study was technically difficult. - Left ventricle: Hypokinesis of the inferior septum, inferior wall, and inferolateral wall. This is new since the study of 2014. The cavity size was normal. Wall thickness was increased in a pattern of moderate LVH. The estimated ejection fraction was 40%. - Aortic valve: Sclerosis without stenosis. There was trivial regurgitation. - Mitral valve: Over time there has been some increase in the mitral inflow gradients. There may be mild functional mitral stenosis. There was mild regurgitation. - Left atrium: The atrium was moderately dilated. - Right ventricle: Systolic function was mildly reduced. - Impressions: Large pleural effusion.  Impressions:  - Large pleural effusion.  Left and Right Heart Catheterization with Coronary Angiography, Abdominal Aortography, and Right Femoral Angiography Report  [name]  73 y.o.  female  12-11-1940  Procedure Date: 06/21/2014  Referring Physician: Mertie Moores, M.D.  Primary Cardiologist: Fransico Him, M.D.  INDICATIONS: Chest discomfort in setting of atrial fibrillation with rapid ventricular response. Prior history of coronary stenting  involving the LAD  PROCEDURE: 1. Left heart catheterization; 2. Coronary angiography; 3. Right heart catheterization; 4. Distal abdominal aortic aortography; 5. Right femoral angiography  CONSENT:  The risks, benefits, and details of the procedure were explained in detail to the patient. Risks including death, stroke, heart attack, kidney injury, allergy, limb ischemia, bleeding and radiation injury were discussed. The patient verbalized understanding and wanted to proceed. Informed written consent was obtained.  HEMODYNAMICS: Aortic pressure 150/54 mmHg; LV pressure 154/18 mmHg; LVEDP 17 mm mercury; RA 5 mm mercury; RV 49/10 mmHg; PA 49/22 mmHg; PCWP(mean) 19 mm mercury; Cardiac Output 6.4 L per minute by Fick and 5.15 L per  minute by thermodilution; mean mitral valve gradient 8 mm mercury; mitral valve area 2.43 cm square; AV gradient 0  ANGIOGRAPHIC DATA: The left main coronary artery is calcified but widely patent..  The left anterior descending artery is patent and contains a proximal stent. Proximal to the stent there is segmental 50% narrowing. There is ostial 40-50% LAD narrowing. The first diagonal arises from within the stented segment and contains ostial and proximal 80-90% obstruction.  The left circumflex artery is patent. The first obtuse marginal before bifurcating into 2 significant branches, contains segmental 50-70% narrowing. The mid circumflex contains 30 -50% narrowing before ending one to small to moderate sized distal obtuse marginal branches..  The right coronary artery is contains a heavily calcified ostium and right sinus of Valsalva. There is 50-70% ostial narrowing. Proximal to mid segmental stenosis between 50 and 70%. PDA is noted distally. No high-grade obstruction is noted in the RCA.  ABDOMINAL AORTOGRAPHY: Documented a total occlusion of the right iliac at the bifurcation. Significant distal aortic plaquing as noted above the bifurcation. There is up to 50% obstruction of the left iliac. Both iliacs a heavily calcified.  RIGHT ILIAC ANGIOGRAPHY: Totally occluded at its origin from the distal aorta. Heavy calcification is noted circumferentially in the right iliac.  LEFT VENTRICULOGRAM: Left ventricular angiogram was done in the 30 RAO projection and revealed normal LV cavity size with asymmetric contractile pattern and an estimated ejection fraction of 40-50%. The mitral valve and annulus the heavily calcified. The aortic valve contains moderate calcification.  IMPRESSIONS: 1. Moderate coronary artery disease involving all 3 vessels including 50% proximal LAD, 50-70% first obtuse marginal, 70% ostial RCA, and 50-70% segmental proximal RCA. The stent in the proximal LAD is widely patent with 80%  stenosis noted in the ostium and proximal portion of the jailed first diagonal.  2. Mildly depressed LV systolic function with an estimated ejection fraction of 40-50%.  3. 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  4. Distal abdominal aortic plaquing.  5. Mitral valve area 2.4 cm square.  6. Moderate pulmonary hypertension  RECOMMENDATION: Risk factor modification.  Dialysis to lower filling pressures.  Beta blocker therapy as needed for functional mitral stenosis.   PHYSICAL EXAM General: Overweight,elderly WF in NAD Head: Normocephalic, atraumatic, sclera non-icteric, oropharynx is clear Neck: Negative for carotid bruits. JVD not elevated. No adenopathy Lungs: Clear bilaterally to auscultation without wheezes, rales, or rhonchi. Breathing is unlabored. Heart: RRR S1 S2 without murmurs, rubs, or gallops.  Abdomen: Soft, non-tender, non-distended with normoactive bowel sounds. No hepatomegaly. No rebound/guarding. No obvious abdominal masses. Extremities: No clubbing, cyanosis or edema.  Neuro: Alert and oriented X 3. Moves all extremities spontaneously. Psych:  Responds to questions appropriately with a normal affect.  Telemetry: SR, 55-61 BPM   ASSESSMENT AND  PLAN: 1. Acute respiratory failure with hypoxia. Clinically much improved with dialysis and correction of volume excess. CXR improved. Tolerated dialysis well. Weight is down 10 lbs since admission.  2. Acute combined systolic/diastolic CHF. LBBB. EF 40-50%. Correction of volume excess with dialysis. Started  metoprolol for rate control - now in 55-60 range. Her new dry weight is 154 lbs. Follow up with a cardiologist in [place] on 06/27/14.   3. Atrial fibrillation. Converted to NSR on amiodarone. 400 mg bid. With diarrhea will reduce to once a day. Now starting on Coumadin for long term anticoagulation. She is concerned about taking coumadin due to history of anemia and need for  transfusion. She states her cardiologist in [place] told her not to take coumadin but it is unclear why. She has had extensive GI evaluation in the past without source of bleeding. Will shoot for low therapeutic INR 2-2.5. If she does have bleeding will need to stop coumadin.  4. ESRD on dialysis.   5. Hyperlipidemia. Statin intolerant in past. On Zetia.   6. S/p bilateral CEA.  7. HTN  8. CAD with remote stent of LAD. Moderate diffuse 3 vessel disease. Will treat medically.  9. DM  10. COPD oxygen dependent.  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  12. Moderate pulmonary HTN  13. Functional mitral stenosis-mild.   Present on Admission:  . Acute respiratory failure . CAD (coronary artery disease)  Signed, Dorothy Spark, [place] 06/24/2014 10:01 AM

## 2014-06-24 NOTE — Progress Notes (Signed)
Pt ambulated in the hallways to about half down the hall; about 117ft with staff at side. Pt denies any pain or discomfort. Pt A&O x4; pt discharge education and instructions completed with pt and daughter at bedside. Both voices understanding and denies any questions. Pt IV removed; telemetry removed and called in to [place]; pt transported off unit via wheelchair with daughter and belongings at side. Pt discharge home with daughter to transport her home. Francis Gaines Gillian Kluever RN.

## 2014-07-06 ENCOUNTER — Telehealth: Payer: Self-pay | Admitting: Internal Medicine

## 2014-07-06 DIAGNOSIS — J439 Emphysema, unspecified: Secondary | ICD-10-CM

## 2014-07-06 DIAGNOSIS — J9611 Chronic respiratory failure with hypoxia: Secondary | ICD-10-CM

## 2014-07-06 NOTE — Telephone Encounter (Signed)
Fine with me but probably should work in sooner with Tammy or me and have fm monitor 02 sats by oximeter and let us know if not staying over 90%  When comes in needs all meds

## 2014-07-06 NOTE — Telephone Encounter (Signed)
Daughter is aware of below. Pt is scheduled to see MW 10/13 and will keep that appt. Daughter aware to bring all meds to visit. Nothing further needed

## 2014-07-06 NOTE — Telephone Encounter (Signed)
Spoke with the pt's daughter  Pt has been using pulsed o2, and daughter thinks that she would do better with continuous flow o2 She has been in and out of the hospital since last visit, and so I have scheduled ov for 07/11/14 for f/u  Daughter asking if we can send order for cont o2 in the meantime to see if this helps her breathing improve  Please advise, thanks!

## 2014-07-11 ENCOUNTER — Ambulatory Visit (INDEPENDENT_AMBULATORY_CARE_PROVIDER_SITE_OTHER): Payer: Medicare Other | Admitting: Internal Medicine

## 2014-07-11 ENCOUNTER — Encounter: Payer: Self-pay | Admitting: Internal Medicine

## 2014-07-11 VITALS — BP 142/70 | HR 76 | Temp 98.6°F | Ht 64.0 in | Wt 152.4 lb

## 2014-07-11 DIAGNOSIS — J449 Chronic obstructive pulmonary disease, unspecified: Secondary | ICD-10-CM

## 2014-07-11 DIAGNOSIS — J9611 Chronic respiratory failure with hypoxia: Secondary | ICD-10-CM

## 2014-07-11 MED ORDER — ALBUTEROL SULFATE HFA 108 (90 BASE) MCG/ACT IN AERS: INHALATION_SPRAY | RESPIRATORY_TRACT | Status: AC

## 2014-07-11 NOTE — Assessment & Plan Note (Addendum)
02 dep since arond 2009 10/11/2013   Walked RA x one lap @ 185 stopped due to  desat to 77% from baseline resting sats of 94% and ok sats on 3lpm x one lap -  05/09/2014   Walked 3lpm x one lap @ 185 stopped due to  Dizzy, no sob or desat - 07/11/2014   Walked "3lpm" ? Pulsed  @ slow pace  x one lap @ 185 stopped due to  Sat 87    rx as of 07/11/2014 3lpm 24/7 and monitor sats on home oximetry plus have [employer] due home 02 titration

## 2014-07-11 NOTE — Patient Instructions (Addendum)
atrovent 2 pffs four time a day  Only use your albuterol (proair) as a rescue medication to be used if you can't catch your breath by resting or doing a relaxed purse lip breathing pattern.  - The less you use it, the better it will work when you need it. - Ok to use up to 2 puffs  every 4 hours if you must but call for immediate appointment if use goes up over your usual need - Don't leave home without it !!  (think of it like the spare tire for your car)   Let [facility] know if you are concerned about your equipment and have them check it out  Please schedule a follow up office visit in 6 weeks, call sooner if needed with whatever medicines you have for your breathing and both of your portable systems so we can check them out too - the goal for your oxygen level is over 90% at all times and ok to adjust the daytime flow but always use 3lpm at bedtime

## 2014-07-11 NOTE — Assessment & Plan Note (Signed)
-   02 dep since around 2009  - PFT's 11/10/2013 FEV1 1.30 ( 58%) ratio 61 and 16% better p B2 , DLCO 68 and corrects to 82   The proper method of use, as well as anticipated side effects, of a metered-dose inhaler are discussed and demonstrated to the patient. Improved effectiveness after extensive coaching during this visit to a level of approximately 75% from a baseline of < 25%    She only has mild/ mod dz and not really clear how much she's limited and note has trouble affording her meds at this point anyway  rec retry atrovent 2 qid and prn saba and if truly limited consider LAMA next ov vs LABA neb /plan B Medicare eligible     Each maintenance medication was reviewed in detail including most importantly the difference between maintenance and as needed and under what circumstances the prns are to be used.  Please see instructions for details which were reviewed in writing and the patient given a copy.

## 2014-07-11 NOTE — Progress Notes (Signed)
Subjective:    Patient ID: [name], female    DOB: 09/17/1941  MRN: [id number]    Brief patient profile:  34 yowf quit smoking 2005 some doe (heavy exertion) at wt 175 and gradually downhill since despite eval by Chodri and rx with inhalers and 02 x around 2009 referred to pulmonary clinic  10/11/2013 by Dr Bea Graff with COPD GOLD II documented 10/2013    History of Present Illness  10/11/2013 1st [facility] office visit/ Wert cc indolent onset progressive doe x 10 y x one half aisle at grocery store on 02 3lpm and usually goes mb and back  s 02 and then sits down and uses combivent and feels better but never tries to rechallenge herself p combivent.  Never has resting sob.  maint on flovent/ combivent  No change in doe Sunday vs Tuesday p hd on mondays rec Stop flovent Start tudorza one twice daily and see if breathing improves and need for combivent drops Please see patient coordinator before you leave today  to schedule portable 02 per [employer] 3lpm continous but no need for 02 at rest or room to room at home - always use it at bedtime though      11/10/2013 f/u ov/Wert re:  GOLD II COPD/02 dep / maint on tudorza only  Chief Complaint  Patient presents with  . Follow-up    Pt states that her breathing is much improved.  Has not needed combivent for rescue.   Wear 02 and walk ok flat and slow "anywhere she wants" now rec Ok with me to adjust your 02 for saturation above 90% Call me if not happy with tudorza and stop the combivent Only use your albuterol )(Proair = red)as a rescue medication    05/09/2014 f/u ov/Wert re: COPD II/ 02 dep Chief Complaint  Patient presents with  . Follow-up    Pt states that her breathing is unchanged since the last visit. No new co's today. She is using atrovent inhaler for rescue just about every night.   only using atrovent at hs No longer using tudorza Not needing neb saba at all, doesn't seem much better on saba hfa  rec Change  atrovent to 2 puffs four times a day automatically  Only use your albuterol (proair)  as a rescue medication  Continue 02 3lpm 24/7    07/11/2014 f/u ov/Wert re:  GOLD II/ 02 dep at hs  Chief Complaint  Patient presents with  . Follow-up    Pt reports her breathing is unchanged since the last visit. She is using atrovent 2 x per day on average.   was supposed to use atrovent 2 qid and prn saba but never did    Not limited by breathing from desired activities  But very sedentary   No obvious day to day or daytime variabilty or assoc chronic cough or cp or chest tightness, subjective wheeze overt sinus or hb symptoms. No unusual exp hx or h/o childhood pna/ asthma or knowledge of premature birth.  Sleeping ok without nocturnal  or early am exacerbation  of respiratory  c/o's or need for noct saba. Also denies any obvious fluctuation of symptoms with weather or environmental changes or other aggravating or alleviating factors except as outlined above   Current Medications, Allergies, Complete Past Medical History, Past Surgical History, Family History, and Social History were reviewed in [employer] record.  ROS  The following are not active complaints unless bolded sore throat, dysphagia,  dental problems, itching, sneezing,  nasal congestion or excess/ purulent secretions, ear ache,   fever, chills, sweats, unintended wt loss, pleuritic or exertional cp, hemoptysis,  orthopnea pnd or leg swelling, presyncope, palpitations, heartburn, abdominal pain, anorexia, nausea, vomiting, diarrhea  or change in bowel or urinary habits, change in stools or urine, dysuria,hematuria,  rash, arthralgias, visual complaints, headache, numbness weakness or ataxia or problems with walking or coordination,  change in mood/affect or memory.      .      Objective:   Physical Exam   amb wf nad  11/10/2013       177 >  05/09/2014 164 > 07/11/2014  152  Wt Readings from Last 3 Encounters:   10/11/13 178 lb (80.74 kg)  09/27/13 171 lb (77.565 kg)  08/31/13 172 lb 13.5 oz (78.4 kg)       HEENT mild turbinate edema.  Edentulous/ Oropharynx no thrush or excess pnd or cobblestoning.  No JVD or cervical adenopathy. Mild accessory muscle hypertrophy. Trachea midline, nl thryroid. Chest was min hyperinflated by percussion with diminished breath sounds and min increased exp time without wheeze. Hoover sign positive at very end of  inspiration. Regular rate and rhythm without murmur gallop or rub or increase P2 or edema.  Abd: no hsm, nl excursion. Ext warm without cyanosis or clubbing.         pCXR 06/20/14 Near complete resolution of CHF, with only mild interstitial  pulmonary edema persisting. Stable bilateral pleural effusions, left  greater than right. Improved aeration in the lung bases, with mild  atelectasis persisting. No new abnormalities.        Assessment & Plan:

## 2014-08-22 ENCOUNTER — Ambulatory Visit: Payer: Medicare Other | Admitting: Internal Medicine

## 2014-08-27 ENCOUNTER — Encounter (HOSPITAL_COMMUNITY): Payer: Self-pay

## 2014-08-27 ENCOUNTER — Observation Stay (HOSPITAL_COMMUNITY): Payer: Medicare Other

## 2014-08-27 ENCOUNTER — Inpatient Hospital Stay (HOSPITAL_COMMUNITY)
Admission: EM | Admit: 2014-08-27 | Discharge: 2014-09-29 | DRG: 391 | Disposition: E | Payer: Medicare Other | Attending: Internal Medicine | Admitting: Internal Medicine

## 2014-08-27 ENCOUNTER — Emergency Department (HOSPITAL_COMMUNITY): Payer: Medicare Other

## 2014-08-27 DIAGNOSIS — G4733 Obstructive sleep apnea (adult) (pediatric): Secondary | ICD-10-CM | POA: Diagnosis present

## 2014-08-27 DIAGNOSIS — M898X9 Other specified disorders of bone, unspecified site: Secondary | ICD-10-CM | POA: Diagnosis present

## 2014-08-27 DIAGNOSIS — E119 Type 2 diabetes mellitus without complications: Secondary | ICD-10-CM | POA: Diagnosis present

## 2014-08-27 DIAGNOSIS — Z66 Do not resuscitate: Secondary | ICD-10-CM | POA: Diagnosis not present

## 2014-08-27 DIAGNOSIS — Z955 Presence of coronary angioplasty implant and graft: Secondary | ICD-10-CM

## 2014-08-27 DIAGNOSIS — N321 Vesicointestinal fistula: Secondary | ICD-10-CM | POA: Diagnosis present

## 2014-08-27 DIAGNOSIS — I5023 Acute on chronic systolic (congestive) heart failure: Secondary | ICD-10-CM | POA: Diagnosis present

## 2014-08-27 DIAGNOSIS — I4891 Unspecified atrial fibrillation: Secondary | ICD-10-CM | POA: Diagnosis present

## 2014-08-27 DIAGNOSIS — Z85528 Personal history of other malignant neoplasm of kidney: Secondary | ICD-10-CM

## 2014-08-27 DIAGNOSIS — R5381 Other malaise: Secondary | ICD-10-CM | POA: Diagnosis present

## 2014-08-27 DIAGNOSIS — K219 Gastro-esophageal reflux disease without esophagitis: Secondary | ICD-10-CM | POA: Diagnosis present

## 2014-08-27 DIAGNOSIS — Z7982 Long term (current) use of aspirin: Secondary | ICD-10-CM | POA: Diagnosis not present

## 2014-08-27 DIAGNOSIS — Z905 Acquired absence of kidney: Secondary | ICD-10-CM | POA: Diagnosis present

## 2014-08-27 DIAGNOSIS — K5660 Unspecified intestinal obstruction: Secondary | ICD-10-CM | POA: Diagnosis not present

## 2014-08-27 DIAGNOSIS — I10 Essential (primary) hypertension: Secondary | ICD-10-CM | POA: Diagnosis present

## 2014-08-27 DIAGNOSIS — E785 Hyperlipidemia, unspecified: Secondary | ICD-10-CM | POA: Diagnosis present

## 2014-08-27 DIAGNOSIS — Z7951 Long term (current) use of inhaled steroids: Secondary | ICD-10-CM | POA: Diagnosis not present

## 2014-08-27 DIAGNOSIS — D509 Iron deficiency anemia, unspecified: Secondary | ICD-10-CM | POA: Diagnosis present

## 2014-08-27 DIAGNOSIS — E43 Unspecified severe protein-calorie malnutrition: Secondary | ICD-10-CM | POA: Diagnosis present

## 2014-08-27 DIAGNOSIS — N2581 Secondary hyperparathyroidism of renal origin: Secondary | ICD-10-CM | POA: Diagnosis present

## 2014-08-27 DIAGNOSIS — I447 Left bundle-branch block, unspecified: Secondary | ICD-10-CM | POA: Diagnosis present

## 2014-08-27 DIAGNOSIS — I120 Hypertensive chronic kidney disease with stage 5 chronic kidney disease or end stage renal disease: Secondary | ICD-10-CM | POA: Diagnosis present

## 2014-08-27 DIAGNOSIS — J449 Chronic obstructive pulmonary disease, unspecified: Secondary | ICD-10-CM | POA: Diagnosis present

## 2014-08-27 DIAGNOSIS — R109 Unspecified abdominal pain: Secondary | ICD-10-CM

## 2014-08-27 DIAGNOSIS — N186 End stage renal disease: Secondary | ICD-10-CM | POA: Diagnosis present

## 2014-08-27 DIAGNOSIS — Z87891 Personal history of nicotine dependence: Secondary | ICD-10-CM

## 2014-08-27 DIAGNOSIS — Z9981 Dependence on supplemental oxygen: Secondary | ICD-10-CM | POA: Diagnosis not present

## 2014-08-27 DIAGNOSIS — Z6825 Body mass index (BMI) 25.0-25.9, adult: Secondary | ICD-10-CM

## 2014-08-27 DIAGNOSIS — R197 Diarrhea, unspecified: Secondary | ICD-10-CM | POA: Diagnosis present

## 2014-08-27 DIAGNOSIS — R112 Nausea with vomiting, unspecified: Secondary | ICD-10-CM | POA: Diagnosis present

## 2014-08-27 DIAGNOSIS — I272 Other secondary pulmonary hypertension: Secondary | ICD-10-CM | POA: Diagnosis present

## 2014-08-27 DIAGNOSIS — N390 Urinary tract infection, site not specified: Secondary | ICD-10-CM | POA: Diagnosis present

## 2014-08-27 DIAGNOSIS — Z79899 Other long term (current) drug therapy: Secondary | ICD-10-CM

## 2014-08-27 DIAGNOSIS — R1084 Generalized abdominal pain: Secondary | ICD-10-CM

## 2014-08-27 DIAGNOSIS — I2510 Atherosclerotic heart disease of native coronary artery without angina pectoris: Secondary | ICD-10-CM | POA: Diagnosis present

## 2014-08-27 DIAGNOSIS — N19 Unspecified kidney failure: Secondary | ICD-10-CM

## 2014-08-27 DIAGNOSIS — Z992 Dependence on renal dialysis: Secondary | ICD-10-CM | POA: Diagnosis not present

## 2014-08-27 DIAGNOSIS — J9620 Acute and chronic respiratory failure, unspecified whether with hypoxia or hypercapnia: Secondary | ICD-10-CM | POA: Diagnosis present

## 2014-08-27 DIAGNOSIS — R1110 Vomiting, unspecified: Secondary | ICD-10-CM

## 2014-08-27 DIAGNOSIS — I480 Paroxysmal atrial fibrillation: Secondary | ICD-10-CM | POA: Diagnosis present

## 2014-08-27 DIAGNOSIS — K5732 Diverticulitis of large intestine without perforation or abscess without bleeding: Principal | ICD-10-CM | POA: Diagnosis present

## 2014-08-27 DIAGNOSIS — I252 Old myocardial infarction: Secondary | ICD-10-CM | POA: Diagnosis not present

## 2014-08-27 DIAGNOSIS — G40909 Epilepsy, unspecified, not intractable, without status epilepticus: Secondary | ICD-10-CM | POA: Diagnosis present

## 2014-08-27 DIAGNOSIS — R0600 Dyspnea, unspecified: Secondary | ICD-10-CM

## 2014-08-27 DIAGNOSIS — I509 Heart failure, unspecified: Secondary | ICD-10-CM

## 2014-08-27 DIAGNOSIS — D638 Anemia in other chronic diseases classified elsewhere: Secondary | ICD-10-CM | POA: Diagnosis present

## 2014-08-27 DIAGNOSIS — I5021 Acute systolic (congestive) heart failure: Secondary | ICD-10-CM | POA: Insufficient documentation

## 2014-08-27 LAB — CBC WITH DIFFERENTIAL/PLATELET
Basophils Absolute: 0 10*3/uL (ref 0.0–0.1)
Basophils Relative: 0 % (ref 0–1)
EOS ABS: 0.1 10*3/uL (ref 0.0–0.7)
EOS PCT: 0 % (ref 0–5)
HCT: 36 % (ref 36.0–46.0)
HEMOGLOBIN: 10.4 g/dL — AB (ref 12.0–15.0)
LYMPHS PCT: 9 % — AB (ref 12–46)
Lymphs Abs: 1 10*3/uL (ref 0.7–4.0)
MCH: 29.4 pg (ref 26.0–34.0)
MCHC: 28.9 g/dL — ABNORMAL LOW (ref 30.0–36.0)
MCV: 101.7 fL — AB (ref 78.0–100.0)
MONOS PCT: 6 % (ref 3–12)
Monocytes Absolute: 0.7 10*3/uL (ref 0.1–1.0)
NEUTROS PCT: 85 % — AB (ref 43–77)
Neutro Abs: 10.2 10*3/uL — ABNORMAL HIGH (ref 1.7–7.7)
PLATELETS: 211 10*3/uL (ref 150–400)
RBC: 3.54 MIL/uL — ABNORMAL LOW (ref 3.87–5.11)
RDW: 16 % — ABNORMAL HIGH (ref 11.5–15.5)
WBC: 12 10*3/uL — AB (ref 4.0–10.5)

## 2014-08-27 LAB — CBC
HCT: 33.4 % — ABNORMAL LOW (ref 36.0–46.0)
Hemoglobin: 9.5 g/dL — ABNORMAL LOW (ref 12.0–15.0)
MCH: 28.2 pg (ref 26.0–34.0)
MCHC: 28.4 g/dL — ABNORMAL LOW (ref 30.0–36.0)
MCV: 99.1 fL (ref 78.0–100.0)
PLATELETS: 186 10*3/uL (ref 150–400)
RBC: 3.37 MIL/uL — AB (ref 3.87–5.11)
RDW: 16 % — AB (ref 11.5–15.5)
WBC: 9.4 10*3/uL (ref 4.0–10.5)

## 2014-08-27 LAB — COMPREHENSIVE METABOLIC PANEL
ALBUMIN: 2.2 g/dL — AB (ref 3.5–5.2)
ALK PHOS: 133 U/L — AB (ref 39–117)
ALT: 11 U/L (ref 0–35)
AST: 16 U/L (ref 0–37)
Anion gap: 14 (ref 5–15)
BUN: 39 mg/dL — ABNORMAL HIGH (ref 6–23)
CALCIUM: 8.5 mg/dL (ref 8.4–10.5)
CO2: 30 mEq/L (ref 19–32)
Chloride: 90 mEq/L — ABNORMAL LOW (ref 96–112)
Creatinine, Ser: 5.42 mg/dL — ABNORMAL HIGH (ref 0.50–1.10)
GFR calc Af Amer: 8 mL/min — ABNORMAL LOW (ref >90)
GFR calc non Af Amer: 7 mL/min — ABNORMAL LOW (ref >90)
Glucose, Bld: 75 mg/dL (ref 70–99)
POTASSIUM: 5.2 meq/L (ref 3.7–5.3)
Sodium: 134 mEq/L — ABNORMAL LOW (ref 137–147)
TOTAL PROTEIN: 5.7 g/dL — AB (ref 6.0–8.3)
Total Bilirubin: 0.5 mg/dL (ref 0.3–1.2)

## 2014-08-27 LAB — MRSA PCR SCREENING: MRSA by PCR: NEGATIVE

## 2014-08-27 LAB — CREATININE, SERUM
Creatinine, Ser: 5.74 mg/dL — ABNORMAL HIGH (ref 0.50–1.10)
GFR calc Af Amer: 8 mL/min — ABNORMAL LOW (ref >90)
GFR, EST NON AFRICAN AMERICAN: 7 mL/min — AB (ref >90)

## 2014-08-27 LAB — I-STAT TROPONIN, ED: TROPONIN I, POC: 0.03 ng/mL (ref 0.00–0.08)

## 2014-08-27 LAB — GLUCOSE, CAPILLARY
GLUCOSE-CAPILLARY: 64 mg/dL — AB (ref 70–99)
GLUCOSE-CAPILLARY: 80 mg/dL (ref 70–99)
Glucose-Capillary: 70 mg/dL (ref 70–99)

## 2014-08-27 LAB — CBG MONITORING, ED: GLUCOSE-CAPILLARY: 74 mg/dL (ref 70–99)

## 2014-08-27 LAB — LIPASE, BLOOD: Lipase: 10 U/L — ABNORMAL LOW (ref 11–59)

## 2014-08-27 LAB — CLOSTRIDIUM DIFFICILE BY PCR: Toxigenic C. Difficile by PCR: NEGATIVE

## 2014-08-27 LAB — TROPONIN I: Troponin I: <0.3 ng/mL (ref <0.30)

## 2014-08-27 MED ORDER — IOHEXOL 300 MG/ML  SOLN
100.0000 mL | Freq: Once | INTRAMUSCULAR | Status: AC | PRN
  Administered 2014-08-27: 100 mL via INTRAVENOUS

## 2014-08-27 MED ORDER — INSULIN ASPART 100 UNIT/ML ~~LOC~~ SOLN: 0.0000 [IU] | Freq: Every day | SUBCUTANEOUS | Status: DC

## 2014-08-27 MED ORDER — ASPIRIN 81 MG PO TABS: 81.0000 mg | ORAL_TABLET | Freq: Every day | ORAL | Status: DC | PRN

## 2014-08-27 MED ORDER — CALCIUM ACETATE 667 MG PO CAPS
667.0000 mg | ORAL_CAPSULE | Freq: Two times a day (BID) | ORAL | Status: DC
  Filled 2014-08-27 (×8): qty 1

## 2014-08-27 MED ORDER — AMIODARONE HCL 200 MG PO TABS
200.0000 mg | ORAL_TABLET | Freq: Every day | ORAL | Status: DC
  Administered 2014-08-27 – 2014-09-03 (×8): 200 mg via ORAL
  Filled 2014-08-27 (×8): qty 1

## 2014-08-27 MED ORDER — GLUCOSE 40 % PO GEL
ORAL | Status: AC
  Filled 2014-08-27: qty 1

## 2014-08-27 MED ORDER — IPRATROPIUM-ALBUTEROL 0.5-2.5 (3) MG/3ML IN SOLN
3.0000 mL | RESPIRATORY_TRACT | Status: DC | PRN
  Administered 2014-08-28 – 2014-08-30 (×3): 3 mL via RESPIRATORY_TRACT
  Filled 2014-08-27 (×4): qty 3

## 2014-08-27 MED ORDER — EZETIMIBE 10 MG PO TABS
10.0000 mg | ORAL_TABLET | Freq: Every day | ORAL | Status: DC
  Administered 2014-08-27 – 2014-09-03 (×6): 10 mg via ORAL
  Filled 2014-08-27 (×8): qty 1

## 2014-08-27 MED ORDER — ONDANSETRON HCL 4 MG/2ML IJ SOLN
4.0000 mg | Freq: Four times a day (QID) | INTRAMUSCULAR | Status: DC | PRN
  Administered 2014-08-29 – 2014-09-01 (×2): 4 mg via INTRAVENOUS
  Filled 2014-08-27 (×4): qty 2

## 2014-08-27 MED ORDER — ONDANSETRON HCL 4 MG PO TABS: 4.0000 mg | ORAL_TABLET | Freq: Four times a day (QID) | ORAL | Status: DC | PRN

## 2014-08-27 MED ORDER — HEPARIN SODIUM (PORCINE) 5000 UNIT/ML IJ SOLN
5000.0000 [IU] | Freq: Three times a day (TID) | INTRAMUSCULAR | Status: DC
  Administered 2014-08-27 – 2014-09-03 (×16): 5000 [IU] via SUBCUTANEOUS
  Filled 2014-08-27 (×23): qty 1

## 2014-08-27 MED ORDER — ACETAMINOPHEN 325 MG PO TABS
650.0000 mg | ORAL_TABLET | Freq: Four times a day (QID) | ORAL | Status: DC | PRN
  Administered 2014-09-01: 650 mg via ORAL
  Filled 2014-08-27: qty 2

## 2014-08-27 MED ORDER — NITROGLYCERIN 2 % TD OINT
1.0000 [in_us] | TOPICAL_OINTMENT | Freq: Once | TRANSDERMAL | Status: AC
  Administered 2014-08-27: 1 [in_us] via TOPICAL
  Filled 2014-08-27: qty 1

## 2014-08-27 MED ORDER — CETYLPYRIDINIUM CHLORIDE 0.05 % MT LIQD
7.0000 mL | Freq: Two times a day (BID) | OROMUCOSAL | Status: DC
  Administered 2014-08-27 – 2014-09-03 (×12): 7 mL via OROMUCOSAL

## 2014-08-27 MED ORDER — CALCIUM ACETATE 667 MG PO CAPS
1334.0000 mg | ORAL_CAPSULE | Freq: Three times a day (TID) | ORAL | Status: DC
  Administered 2014-08-29 – 2014-09-02 (×9): 1334 mg via ORAL
  Filled 2014-08-27 (×22): qty 2

## 2014-08-27 MED ORDER — ALBUTEROL SULFATE (2.5 MG/3ML) 0.083% IN NEBU
2.5000 mg | INHALATION_SOLUTION | Freq: Four times a day (QID) | RESPIRATORY_TRACT | Status: DC | PRN
  Administered 2014-08-27: 2.5 mg via RESPIRATORY_TRACT
  Filled 2014-08-27: qty 3

## 2014-08-27 MED ORDER — ASPIRIN 81 MG PO CHEW: 81.0000 mg | CHEWABLE_TABLET | Freq: Every day | ORAL | Status: DC | PRN

## 2014-08-27 MED ORDER — ACETAMINOPHEN 650 MG RE SUPP: 650.0000 mg | Freq: Four times a day (QID) | RECTAL | Status: DC | PRN

## 2014-08-27 MED ORDER — ALUM & MAG HYDROXIDE-SIMETH 200-200-20 MG/5ML PO SUSP: 30.0000 mL | Freq: Four times a day (QID) | ORAL | Status: DC | PRN

## 2014-08-27 MED ORDER — IPRATROPIUM BROMIDE 0.02 % IN SOLN
0.5000 mg | Freq: Four times a day (QID) | RESPIRATORY_TRACT | Status: DC | PRN
  Administered 2014-08-27: 0.5 mg via RESPIRATORY_TRACT
  Filled 2014-08-27: qty 2.5

## 2014-08-27 MED ORDER — NITROGLYCERIN 2 % TD OINT
0.5000 [in_us] | TOPICAL_OINTMENT | Freq: Four times a day (QID) | TRANSDERMAL | Status: DC
  Administered 2014-08-28 – 2014-08-29 (×2): 0.5 [in_us] via TOPICAL
  Filled 2014-08-27: qty 30

## 2014-08-27 MED ORDER — ROPINIROLE HCL 1 MG PO TABS
1.0000 mg | ORAL_TABLET | Freq: Two times a day (BID) | ORAL | Status: DC
  Administered 2014-08-27 – 2014-09-03 (×13): 1 mg via ORAL
  Filled 2014-08-27 (×16): qty 1

## 2014-08-27 MED ORDER — IPRATROPIUM BROMIDE HFA 17 MCG/ACT IN AERS: 2.0000 | INHALATION_SPRAY | Freq: Four times a day (QID) | RESPIRATORY_TRACT | Status: DC | PRN

## 2014-08-27 MED ORDER — ALBUTEROL SULFATE HFA 108 (90 BASE) MCG/ACT IN AERS: 2.0000 | INHALATION_SPRAY | Freq: Four times a day (QID) | RESPIRATORY_TRACT | Status: DC | PRN

## 2014-08-27 MED ORDER — INSULIN ASPART 100 UNIT/ML ~~LOC~~ SOLN
0.0000 [IU] | Freq: Three times a day (TID) | SUBCUTANEOUS | Status: DC
  Administered 2014-08-29: 1 [IU] via SUBCUTANEOUS

## 2014-08-27 MED ORDER — HYDROCODONE-ACETAMINOPHEN 5-325 MG PO TABS
0.5000 | ORAL_TABLET | Freq: Four times a day (QID) | ORAL | Status: DC | PRN
  Administered 2014-08-27 – 2014-08-28 (×3): 0.5 via ORAL
  Filled 2014-08-27 (×3): qty 1

## 2014-08-27 MED ORDER — ONDANSETRON 4 MG PO TBDP
4.0000 mg | ORAL_TABLET | Freq: Three times a day (TID) | ORAL | Status: DC | PRN
  Administered 2014-08-27 – 2014-09-01 (×2): 4 mg via ORAL
  Filled 2014-08-27 (×3): qty 1

## 2014-08-27 NOTE — ED Provider Notes (Signed)
CSN: [id number]     Arrival date & time 08/15/2014  1120 History   First MD Initiated Contact with Patient 08/01/2014 1205     Chief Complaint  Patient presents with  . Shortness of Breath     (Consider location/radiation/quality/duration/timing/severity/associated sxs/prior Treatment) HPI  Diarrhea for 2 weeks, frequent small amounts. H/O c Diff. Vomiting 3-4 days. Diffuse abdominal pain, moderate cramping. No fever. SOB with no cough, on oxygen for years. Worse for 3-4 days. "Indigestion" yesterday, brief. Improved with ASA. The patient is due for dialysis tomorrow. She for she thinks they are taking "enough off" anymore.  Past Medical History  Diagnosis Date  . Carotid artery occlusion     bilat CEA in 2012  . C. 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 Hypertension   . Atrial fibrillation April  2013  . Pulmonary hypertension   . Secondary hyperparathyroidism, renal   . COPD (chronic obstructive pulmonary disease)   . Hypercholesterolemia   . History of MI (myocardial infarction)     "they say I had 2 years ago" (08/20/2012)  . GERD (gastroesophageal reflux disease)   . History of pneumonia     "have it alot" (08/20/2012)  . Sleep apnea     "don't wear mask anymore"  . Type II diabetes mellitus   . History of blood transfusion 2013    "3 times so far in 2013:  4 pints one time; 2 pints another; ?# last time" (08/20/2012)  . Iron deficiency anemia   . Seizures 2012    after carotid surgery  . ESRD on dialysis     "[facility]; Tues, Thurs; Sat"  . On home oxygen therapy     "24/7"   . Acute diverticulitis     Oct 2013, treated medically [facility], sigmoid diverticulitits  . CHF (congestive heart failure)   . Seizure disorder March 2011  . Myocardial infarction   . Renal cell carcinoma 2005?  Marland Kitchen Cancer of kidney 2005  . Renal insufficiency   . Coronary artery disease     remote MI with PCI   Past Surgical History  Procedure Laterality Date   . Nephrectomy  2005    Right, for Renal cell carcinoma  . Carotid endarterectomy  2012    bilaterally; Dr. Kellie Simmering  . Av fistula placement  Jan. 7, 2011    Right  upper arm by Dr. Kellie Simmering  . Hd catheter  Aug. 22, 2013    [facility]  . Appendectomy      childhood  . Abdominal hysterectomy  1971?  Marland Kitchen Coronary angioplasty with stent placement  1990's    "1 total"  . Coronary angioplasty  1990's  . Av fistula repair  2013    right upper arm  . Esophagogastroduodenoscopy N/A 12/29/2012    Procedure: ESOPHAGOGASTRODUODENOSCOPY (EGD);  Surgeon: Jeryl Columbia, MD;  Location: [facility] ENDOSCOPY;  Service: Endoscopy;  Laterality: N/A;  . Colonoscopy with propofol N/A 12/31/2012    Procedure: COLONOSCOPY WITH PROPOFOL;  Surgeon: Jeryl Columbia, MD;  Location: [facility] ENDOSCOPY;  Service: Endoscopy;  Laterality: N/A;  currently IP at [facility] History  Problem Relation Age of Onset  . Cancer Mother     pancreatic  . Diabetes Mother   . Stroke Father   . Coronary artery disease Father   . Heart disease Father 20    Heart Disease before age 35  . Hyperlipidemia Father   . Hypertension Father   .  Heart attack Father   . Cancer Brother     Brain  . Kidney disease Brother     Kidney stones  . Emphysema Father     smoked  . Pancreatic cancer Mother    History  Substance Use Topics  . Smoking status: Former Smoker -- 1.50 packs/day for 45 years    Types: Cigarettes    Quit date: 09/30/2003  . Smokeless tobacco: Never Used  . Alcohol Use: No   OB History    No data available     Review of Systems 10 Systems reviewed and are negative for acute change except as noted in the HPI.   Allergies  Lipitor; Morphine and related; and Nsaids  Home Medications   Prior to Admission medications   Medication Sig Start Date End Date Taking? Authorizing Provider  albuterol (PROAIR HFA) 108 (90 BASE) MCG/ACT inhaler 2 puffs every 4 hours as needed only  if your can't catch your breath 07/11/14   Yes Tanda Rockers, MD  albuterol (PROVENTIL) (2.5 MG/3ML) 0.083% nebulizer solution Take 2.5 mg by nebulization every 6 (six) hours as needed for wheezing or shortness of breath.   Yes Historical Provider, MD  amiodarone (PACERONE) 200 MG tablet Take 200 mg by mouth daily.   Yes Historical Provider, MD  aspirin 81 MG tablet Take 81 mg by mouth daily as needed for pain (chest pain).   Yes Historical Provider, MD  calcium acetate (PHOSLO) 667 MG capsule Take 667-1,334 mg by mouth 3 (three) times daily with meals. 2 capsules TID with meals, and 1 capsule BID with snacks   Yes Historical Provider, MD  ezetimibe (ZETIA) 10 MG tablet Take 10 mg by mouth at bedtime.   Yes Historical Provider, MD  HYDROcodone-acetaminophen (NORCO/VICODIN) 5-325 MG per tablet Take 0.5 tablets by mouth every 6 (six) hours as needed for moderate pain.   Yes Historical Provider, MD  ipratropium (ATROVENT HFA) 17 MCG/ACT inhaler Inhale 2 puffs into the lungs every 6 (six) hours as needed for wheezing.   Yes Historical Provider, MD  multivitamin (RENA-VIT) TABS tablet Take 1 tablet by mouth daily with lunch.   Yes Historical Provider, MD  omeprazole (PRILOSEC) 40 MG capsule Take 40 mg by mouth daily.   Yes Historical Provider, MD  ondansetron (ZOFRAN-ODT) 4 MG disintegrating tablet Take 1 tablet (4 mg total) by mouth every 8 (eight) hours as needed for nausea or vomiting. 05/27/14  Yes Everlene Balls, MD  OXYGEN Inhale 3 L into the lungs daily.   Yes Historical Provider, MD  rOPINIRole (REQUIP) 1 MG tablet Take 1 mg by mouth 2 (two) times daily.   Yes Historical Provider, MD  amiodarone (PACERONE) 400 MG tablet Take 1 tablet (400 mg total) by mouth daily. Patient not taking: Reported on 08/02/2014 06/23/14   Domenic Polite, MD  dicyclomine (BENTYL) 10 MG capsule Take 10 mg by mouth daily as needed for spasms.    Historical Provider, MD  metoCLOPramide (REGLAN) 5 MG tablet Take 5 mg by mouth 2 (two) times daily.    Historical Provider,  MD   BP 138/48 mmHg  Pulse 75  Temp(Src) 98.3 F (36.8 C) (Oral)  Resp 18  SpO2 97% Physical Exam  Constitutional: She is oriented to person, place, and time. She appears well-developed and well-nourished.  HENT:  Head: Normocephalic and atraumatic.  Eyes: EOM are normal. Pupils are equal, round, and reactive to light.  Neck: Neck supple.  Cardiovascular: Normal rate, regular rhythm, normal heart sounds  and intact distal pulses.   Pulmonary/Chest: Effort normal and breath sounds normal.  Abdominal: Soft. Bowel sounds are normal. She exhibits no distension. There is no tenderness.  Musculoskeletal: Normal range of motion. She exhibits no edema.  Neurological: She is alert and oriented to person, place, and time. She has normal strength. Coordination normal. GCS eye subscore is 4. GCS verbal subscore is 5. GCS motor subscore is 6.  Skin: Skin is warm, dry and intact.  Psychiatric: She has a normal mood and affect.    ED Course  Procedures (including critical care time) Labs Review Labs Reviewed  COMPREHENSIVE METABOLIC PANEL - Abnormal; Notable for the following:    Sodium 134 (*)    Chloride 90 (*)    BUN 39 (*)    Creatinine, Ser 5.42 (*)    Total Protein 5.7 (*)    Albumin 2.2 (*)    Alkaline Phosphatase 133 (*)    GFR calc non Af Amer 7 (*)    GFR calc Af Amer 8 (*)    All other components within normal limits  LIPASE, BLOOD - Abnormal; Notable for the following:    Lipase 10 (*)    All other components within normal limits  CBC WITH DIFFERENTIAL - Abnormal; Notable for the following:    WBC 12.0 (*)    RBC 3.54 (*)    Hemoglobin 10.4 (*)    MCV 101.7 (*)    MCHC 28.9 (*)    RDW 16.0 (*)    Neutrophils Relative % 85 (*)    Neutro Abs 10.2 (*)    Lymphocytes Relative 9 (*)    All other components within normal limits  CLOSTRIDIUM DIFFICILE BY PCR  I-STAT TROPOININ, ED    Imaging Review Dg Chest 2 View  07/30/2014   CLINICAL DATA:  Shortness of breath.   EXAM: CHEST  2 VIEW  COMPARISON:  08/23/2014  FINDINGS: There are bilateral pleural effusions, right side greater the left. The right pleural effusion is moderate for size. Diffuse interstitial lung densities may represent interstitial pulmonary edema. Evidence for a coronary artery stent. Heart size is grossly normal.  IMPRESSION: Bilateral pleural effusions, right side greater the left.  Prominent lung markings suggest interstitial edema.   Electronically Signed   By: Markus Daft M.D.   On: 08/07/2014 13:47     EKG Interpretation None     Reassessment 16:25. The patient has increased dyspnea at this point in time. Upon initial assessment there was no appreciable dyspnea at rest. Now the patient has mild to moderate dyspnea at rest. She is speaking in full sentences and her mental status is clear.  Consult: I have counseled to Dr. Justin Mend of nephrology. He is familiar with the patient and advises for admission at this point time he will assess the patient in the emergency department to determine her need for early dialysis. MDM   Final diagnoses:  Diarrhea  Dyspnea  Chronic congestive heart failure, unspecified congestive heart failure type  Renal failure   Patient has possible C. difficile. She is reporting diarrhea with history of known Clostridium difficile. She is afebrile and nontoxic at this point in time. With her chronic renal failure she is actually volume overloaded rather than dehydrated currently. She is however showing signs of increasing respiratory distress and chest x-ray shows congestion with pleural effusion present. The plan will be for admission and nephrology consultation.    Charlesetta Shanks, MD 08/26/2014 [phone]

## 2014-08-27 NOTE — Discharge Instructions (Signed)
Shortness of Breath Shortness of breath means you have trouble breathing. It could also mean that you have a medical problem. You should get immediate medical care for shortness of breath. CAUSES   Not enough oxygen in the air such as with high altitudes or a smoke-filled room.  Certain lung diseases, infections, or problems.  Heart disease or conditions, such as angina or heart failure.  Low red blood cells (anemia).  Poor physical fitness, which can cause shortness of breath when you exercise.  Chest or back injuries or stiffness.  Being overweight.  Smoking.  Anxiety, which can make you feel like you are not getting enough air. DIAGNOSIS  Serious medical problems can often be found during your physical exam. Tests may also be done to determine why you are having shortness of breath. Tests may include:  Chest X-rays.  Lung function tests.  Blood tests.  An electrocardiogram (ECG).  An ambulatory electrocardiogram. An ambulatory ECG records your heartbeat patterns over a 24-hour period.  Exercise testing.  A transthoracic echocardiogram (TTE). During echocardiography, sound waves are used to evaluate how blood flows through your heart.  A transesophageal echocardiogram (TEE).  Imaging scans. Your health care provider may not be able to find a cause for your shortness of breath after your exam. In this case, it is important to have a follow-up exam with your health care provider as directed.  TREATMENT  Treatment for shortness of breath depends on the cause of your symptoms and can vary greatly. HOME CARE INSTRUCTIONS   Do not smoke. Smoking is a common cause of shortness of breath. If you smoke, ask for help to quit.  Avoid being around chemicals or things that may bother your breathing, such as paint fumes and dust.  Rest as needed. Slowly resume your usual activities.  If medicines were prescribed, take them as directed for the full length of time directed. This  includes oxygen and any inhaled medicines.  Keep all follow-up appointments as directed by your health care provider. SEEK MEDICAL CARE IF:   Your condition does not improve in the time expected.  You have a hard time doing your normal activities even with rest.  You have any new symptoms. SEEK IMMEDIATE MEDICAL CARE IF:   Your shortness of breath gets worse.  You feel light-headed, faint, or develop a cough not controlled with medicines.  You start coughing up blood.  You have pain with breathing.  You have chest pain or pain in your arms, shoulders, or abdomen.  You have a fever.  You are unable to walk up stairs or exercise the way you normally do. MAKE SURE YOU:  Understand these instructions.  Will watch your condition.  Will get help right away if you are not doing well or get worse. Document Released: 06/10/2001 Document Revised: 09/20/2013 Document Reviewed: 12/01/2011 [facility] Patient Information 2015 [place], [place]. This information is not intended to replace advice given to you by your health care provider. Make sure you discuss any questions you have with your health care provider. Diarrhea Diarrhea is frequent loose and watery bowel movements. It can cause you to feel weak and dehydrated. Dehydration can cause you to become tired and thirsty, have a dry mouth, and have decreased urination that often is dark yellow. Diarrhea is a sign of another problem, most often an infection that will not last long. In most cases, diarrhea typically lasts 2-3 days. However, it can last longer if it is a sign of something more serious.  It is important to treat your diarrhea as directed by your caregiver to lessen or prevent future episodes of diarrhea. CAUSES  Some common causes include:  Gastrointestinal infections caused by viruses, bacteria, or parasites.  Food poisoning or food allergies.  Certain medicines, such as antibiotics, chemotherapy, and laxatives.  Artificial  sweeteners and fructose.  Digestive disorders. HOME CARE INSTRUCTIONS  Ensure adequate fluid intake (hydration): Have 1 cup (8 oz) of fluid for each diarrhea episode. Avoid fluids that contain simple sugars or sports drinks, fruit juices, whole milk products, and sodas. Your urine should be clear or pale yellow if you are drinking enough fluids. Hydrate with an oral rehydration solution that you can purchase at pharmacies, retail stores, and online. You can prepare an oral rehydration solution at home by mixing the following ingredients together:   - tsp table salt.   tsp baking soda.   tsp salt substitute containing potassium chloride.  1  tablespoons sugar.  1 L (34 oz) of water.  Certain foods and beverages may increase the speed at which food moves through the gastrointestinal (GI) tract. These foods and beverages should be avoided and include:  Caffeinated and alcoholic beverages.  High-fiber foods, such as raw fruits and vegetables, nuts, seeds, and whole grain breads and cereals.  Foods and beverages sweetened with sugar alcohols, such as xylitol, sorbitol, and mannitol.  Some foods may be well tolerated and may help thicken stool including:  Starchy foods, such as rice, toast, pasta, low-sugar cereal, oatmeal, grits, baked potatoes, crackers, and bagels.  Bananas.  Applesauce.  Add probiotic-rich foods to help increase healthy bacteria in the GI tract, such as yogurt and fermented milk products.  Wash your hands well after each diarrhea episode.  Only take over-the-counter or prescription medicines as directed by your caregiver.  Take a warm bath to relieve any burning or pain from frequent diarrhea episodes. SEEK IMMEDIATE MEDICAL CARE IF:   You are unable to keep fluids down.  You have persistent vomiting.  You have blood in your stool, or your stools are black and tarry.  You do not urinate in 6-8 hours, or there is only a small amount of very dark  urine.  You have abdominal pain that increases or localizes.  You have weakness, dizziness, confusion, or light-headedness.  You have a severe headache.  Your diarrhea gets worse or does not get better.  You have a fever or persistent symptoms for more than 2-3 days.  You have a fever and your symptoms suddenly get worse. MAKE SURE YOU:   Understand these instructions.  Will watch your condition.  Will get help right away if you are not doing well or get worse. Document Released: 09/05/2002 Document Revised: 01/30/2014 Document Reviewed: 05/23/2012 [facility] Patient Information 2015 [place], [place]. This information is not intended to replace advice given to you by your health care provider. Make sure you discuss any questions you have with your health care provider.

## 2014-08-27 NOTE — H&P (Addendum)
[facility] History and Physical  [name]:[id number] DOB: December 29, 1940 DOA: 08/20/2014   PCP: Gilford Rile, MD    Chief Complaint: abd pain, vomiting and diarrhea  HPI: [name] is a 73 y.o. 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  Patient comes in to the hospital with a complaint of abdominal pain nausea vomiting and diarrhea. She has been having these symptoms on and off for about 2 weeks now but they have become much worse. She states that she is having diarrhea despite the fact that she is barely eating. She sometimes has to have BMs in the middle of the night. She has omitted dairy from her diet without any notable improvement. She's not noticed any blood in her stool or her vomitus. She noted epigastric pain yesterday and took an aspirin and eventually the pain faded away. It recurred today and she again took an aspirin and currently is no longer in pain. She has not had any fevers or chills. She is also found to have pulmonary edema on exam and does complain of shortness of breath for the past day. She is not requiring more than her usual 3 L of oxygen at this time. She does not have any cough and has not had any wheezing. She is due for dialysis tomorrow morning and has been evaluated by the renal team who plan on dialyzing her tomorrow.   General: The patient denies anorexia, fever, weight loss Cardiac: Denies chest pain, syncope, palpitations, pedal edema  Respiratory: Denies cough,  Wheezing- admits to shortness of breath, GI: Denies severe indigestion/heartburn, abdominal pain, nausea, vomiting, diarrhea and constipation GU: Denies hematuria, incontinence- has had dysuria over  the past week - makes a small amount of urine Musculoskeletal: Denies arthritis - has chronic back pain Skin: Denies suspicious skin lesions Neurologic: Denies focal weakness or numbness, change in vision Psychiatry: Denies depression or anxiety.   Past Medical History  Diagnosis Date  . Carotid artery occlusion     bilat CEA in 2012  . C. difficile diarrhea with colovesical fistula     Recurrent, inital onset Feb 2013  . Hypertension   . Atrial fibrillation April  2013  . Pulmonary hypertension   . Secondary hyperparathyroidism, renal   . COPD (chronic obstructive pulmonary disease) on 3 L of oxygen at home    . Hypercholesterolemia   . History of MI (myocardial infarction)     "they say I had 2 years ago" (08/20/2012)  . GERD (gastroesophageal reflux disease)   . History of pneumonia     "have it alot" (08/20/2012)  . Sleep apnea     "don't wear mask anymore"  . Type II diabetes mellitus   . History of blood transfusion 2013    "3 times so far in 2013:  4 pints one time; 2 pints another; ?# last time" (08/20/2012)  . Iron deficiency anemia   . Seizures 2012    after carotid surgery  . ESRD on dialysis- dialyzes Monday Wednesday Friday    . Acute diverticulitis     Oct 2013, treated medically [facility], sigmoid diverticulitits  . Renal cell carcinoma- right 2005?  Marland Kitchen Coronary artery disease     remote MI with PCI  Past Surgical History  Procedure Laterality Date  . Nephrectomy  2005    Right, for Renal cell carcinoma  . Carotid endarterectomy  2012    bilaterally; Dr. Kellie Simmering  . Av fistula placement  Jan. 7, 2011    Right  upper arm by Dr. Kellie Simmering  . Hd catheter  Aug. 22, 2013    [facility]  . Appendectomy      childhood  . Abdominal hysterectomy  1971?  Marland Kitchen Coronary angioplasty with stent placement  1990's    "1 total"  . Coronary angioplasty  1990's  . Av fistula repair  2013    right upper arm  . Esophagogastroduodenoscopy N/A 12/29/2012     Procedure: ESOPHAGOGASTRODUODENOSCOPY (EGD);  Surgeon: Jeryl Columbia, MD;  Location: [facility] ENDOSCOPY;  Service: Endoscopy;  Laterality: N/A;  . Colonoscopy with propofol N/A 12/31/2012    Procedure: COLONOSCOPY WITH PROPOFOL;  Surgeon: Jeryl Columbia, MD;  Location: [facility] ENDOSCOPY;  Service: Endoscopy;  Laterality: N/A;  currently IP at [place]    Social History: quit smoking about 11 yrs ago. Does not drink alcohol Lives at home with daughter.     Allergies  Allergen Reactions  . Lipitor [Atorvastatin] Other (See Comments)    Causes weakness and drowsiness  . Morphine And Related Nausea And Vomiting  . Nsaids Other (See Comments)    Unknown reported by previous hospital    Family History  Problem Relation Age of Onset  . Cancer Mother     pancreatic  . Diabetes Mother   . Stroke Father   . Coronary artery disease Father   . Heart disease Father 5    Heart Disease before age 48  . Hyperlipidemia Father   . Hypertension Father   . Heart attack Father   . Cancer Brother     Brain  . Kidney disease Brother     Kidney stones  . Emphysema Father     smoked  . Pancreatic cancer Mother       Prior to Admission medications   Medication Sig Start Date End Date Taking? Authorizing Provider  albuterol (PROAIR HFA) 108 (90 BASE) MCG/ACT inhaler 2 puffs every 4 hours as needed only  if your can't catch your breath 07/11/14  Yes Tanda Rockers, MD  albuterol (PROVENTIL) (2.5 MG/3ML) 0.083% nebulizer solution Take 2.5 mg by nebulization every 6 (six) hours as needed for wheezing or shortness of breath.   Yes Historical Provider, MD  amiodarone (PACERONE) 200 MG tablet Take 200 mg by mouth daily.   Yes Historical Provider, MD  aspirin 81 MG tablet Take 81 mg by mouth daily as needed for pain (chest pain).   Yes Historical Provider, MD  calcium acetate (PHOSLO) 667 MG capsule Take 667-1,334 mg by mouth 3 (three) times daily with meals. 2 capsules TID with meals, and 1 capsule BID with snacks    Yes Historical Provider, MD  ezetimibe (ZETIA) 10 MG tablet Take 10 mg by mouth at bedtime.   Yes Historical Provider, MD  HYDROcodone-acetaminophen (NORCO/VICODIN) 5-325 MG per tablet Take 0.5 tablets by mouth every 6 (six) hours as needed for moderate pain.   Yes Historical Provider, MD  ipratropium (ATROVENT HFA) 17 MCG/ACT inhaler Inhale 2 puffs into the lungs every 6 (six) hours as needed for wheezing.   Yes Historical Provider, MD  multivitamin (RENA-VIT) TABS tablet Take 1 tablet by mouth daily with lunch.   Yes Historical Provider, MD  omeprazole (PRILOSEC) 40 MG capsule Take  40 mg by mouth daily.   Yes Historical Provider, MD  ondansetron (ZOFRAN-ODT) 4 MG disintegrating tablet Take 1 tablet (4 mg total) by mouth every 8 (eight) hours as needed for nausea or vomiting. 05/27/14  Yes Everlene Balls, MD  OXYGEN Inhale 3 L into the lungs daily.   Yes Historical Provider, MD  rOPINIRole (REQUIP) 1 MG tablet Take 1 mg by mouth 2 (two) times daily.   Yes Historical Provider, MD     Physical Exam: Filed Vitals:   08/01/2014 1545 08/05/2014 1615 08/11/2014 1643 07/30/2014 1645  BP: 140/41 142/69 161/69 151/61  Pulse: 77 81 81 80  Temp:      TempSrc:      Resp:   20   SpO2: 97% 94% 98% 98%     General: Awake alert oriented 3, no acute distress HEENT: Normocephalic and Atraumatic, Mucous membranes pink                PERRLA; EOM intact; No scleral icterus,                 Nares: Patent, Oropharynx: Clear, Fair Dentition                 Neck: FROM, no cervical lymphadenopathy, thyromegaly, carotid bruit or JVD;  Breasts: deferred CHEST WALL: No tenderness  CHEST: Normal respiration, mild crackles at the bases-no respiratory distress-able to speak in full sentences HEART: Regular rate and rhythm; no murmurs rubs or gallops  BACK: No kyphosis or scoliosis; no CVA tenderness  ABDOMEN: Positive Bowel Sounds, soft, tender across the lower abdomen; no masses, no organomegaly Rectal Exam:  deferred EXTREMITIES: No cyanosis, clubbing, or edema Genitalia: not examined  SKIN:  no rash or ulceration  CNS: Alert and Oriented x 4, Nonfocal exam, CN 2-12 intact  Labs on Admission:  Basic Metabolic Panel:  Recent Labs Lab 08/06/2014 1150  NA 134*  K 5.2  CL 90*  CO2 30  GLUCOSE 75  BUN 39*  CREATININE 5.42*  CALCIUM 8.5   Liver Function Tests:  Recent Labs Lab 08/23/2014 1150  AST 16  ALT 11  ALKPHOS 133*  BILITOT 0.5  PROT 5.7*  ALBUMIN 2.2*    Recent Labs Lab 08/23/2014 1150  LIPASE 10*   No results for input(s): AMMONIA in the last 168 hours. CBC:  Recent Labs Lab 07/30/2014 1150  WBC 12.0*  NEUTROABS 10.2*  HGB 10.4*  HCT 36.0  MCV 101.7*  PLT 211   Cardiac Enzymes: No results for input(s): CKTOTAL, CKMB, CKMBINDEX, TROPONINI in the last 168 hours.  BNP (last 3 results)  Recent Labs  08/27/13 2220 04/19/14 0410 06/06/14 1620  PROBNP 14565.0* 30133.0* >70000.0*   CBG:  Recent Labs Lab 08/15/2014 1719  GLUCAP 74    Radiological Exams on Admission: Dg Chest 2 View  08/01/2014   CLINICAL DATA:  Shortness of breath.  EXAM: CHEST  2 VIEW  COMPARISON:  08/23/2014  FINDINGS: There are bilateral pleural effusions, right side greater the left. The right pleural effusion is moderate for size. Diffuse interstitial lung densities may represent interstitial pulmonary edema. Evidence for a coronary artery stent. Heart size is grossly normal.  IMPRESSION: Bilateral pleural effusions, right side greater the left.  Prominent lung markings suggest interstitial edema.   Electronically Signed   By: Markus Daft M.D.   On: 08/02/2014 13:47    EKG: Independently reviewed. Normal sinus rhythm with a left bundle-branch block and a QTC of 539 ms  Assessment/Plan Principal  Problem:   Abdominal pain, vomiting, and diarrhea -C. difficile PCR negative -Will obtain a GI pathogen panel and a CT scan of the abdomen and pelvis -will place on clear liquids for  now -Her med rec states that she is on metoclopramide and Bentyl at home however she states that she is no longer taking these -Will hold PPI (as this can sometimes cause diarrhea) see if there is any improvement in the next 48 hours  Active Problems:   CKD (chronic kidney disease) stage V requiring chronic dialysis- with fluid overload -Will be dialyzed tomorrow morning    CHF (congestive heart failure)- acute on chronic systolic heart failure -Last echo (06/19/2014 ) reveals an EF of 40%- not on nitroglycerin/hydralazine combination - has chronic pleural effusions which are worse when compared to prior x-ray in September -Will allow renal team to manage with dialysis  Epigastric pain -Not certain if this is related to above-mentioned GI symptoms or if it is cardiac.  -EKG is unchanged from prior  -She has received Nitropaste in the ER.  - I will order 3 sets of troponins-he has already taken aspirin at home-will continue baby aspirin daily  Dysuria -Will request a UA- will need to I and O cath     Atrial fibrillation- paroxysmal -We'll continue to 200 mg of amiodarone daily -She is not on anticoagulation    COPD GOLD II -Continue inhalers when necessary and 3 L of oxygen  Diabetes mellitus 2 -This is mainly diet controlled-she was told to take Glucotrol if her blood sugar is greater than 150 and she rarely has to take it- place her on a low-dose sliding scale    Consulted:  [facility] -spoke with Dr. Amedeo Plenty   Code Status: Full code Family Communication: daughter  DVT Prophylaxis:Heparin  Time spent: 4 minutes  [place], MD [facility]  If 7PM-7AM, please contact night-coverage [email or web address] 08/20/2014, 6:01 PM

## 2014-08-27 NOTE — ED Notes (Signed)
Admitting MD at bedside.

## 2014-08-27 NOTE — ED Notes (Addendum)
Pt states shortness of breath and nausea/vomiting. Hemodialysis patient, last treatment on Friday. Pt also reports increasing SOB, wears 3L O2 at home, but states breathing has become worse. Reports recent weight gain of 4lbs since last HD treatment. Respirations labored upon arrival to ED. Scattered rhonchi heard all lung fields. Pt is alert and oriented x4. HD catheter to R upper arm.

## 2014-08-27 NOTE — Progress Notes (Signed)
Patient arrived on unit via stretcher from ED. Telemetry placed per MD order.  Patient placed on enteric contact.  Patient alert and oriented, no family at bedside.

## 2014-08-27 NOTE — ED Notes (Signed)
Pt. Reports having n/v/d and sob since Tuesday.  Pt. Was at dialysis on Tuesday and they called an ambulance they took her [name] and was diagnosed with stomach virus.  Pt. Has not improved.  She continues to have sob denies any cough, denies any chest pain.  Pt. Is on Home oxygen 3l.  Skin is pale warm and dry, sats are 87-88%. ECG done in Triage.  Pt. Had dialysis on Friday.

## 2014-08-27 NOTE — ED Notes (Signed)
Pt in xray

## 2014-08-28 DIAGNOSIS — J449 Chronic obstructive pulmonary disease, unspecified: Secondary | ICD-10-CM

## 2014-08-28 LAB — URINALYSIS, ROUTINE W REFLEX MICROSCOPIC
Bilirubin Urine: NEGATIVE
GLUCOSE, UA: NEGATIVE mg/dL
Ketones, ur: NEGATIVE mg/dL
Nitrite: NEGATIVE
Protein, ur: 100 mg/dL — AB
SPECIFIC GRAVITY, URINE: 1.013 (ref 1.005–1.030)
Urobilinogen, UA: 0.2 mg/dL (ref 0.0–1.0)
pH: 7 (ref 5.0–8.0)

## 2014-08-28 LAB — GI PATHOGEN PANEL BY PCR, STOOL
C difficile toxin A/B: NEGATIVE
CRYPTOSPORIDIUM BY PCR: NEGATIVE
Campylobacter by PCR: NEGATIVE
E COLI (STEC): NEGATIVE
E coli (ETEC) LT/ST: NEGATIVE
E coli 0157 by PCR: NEGATIVE
G lamblia by PCR: NEGATIVE
NOROVIRUS G1/G2: POSITIVE
ROTAVIRUS A BY PCR: NEGATIVE
Salmonella by PCR: NEGATIVE
Shigella by PCR: NEGATIVE

## 2014-08-28 LAB — BASIC METABOLIC PANEL
Anion gap: 18 — ABNORMAL HIGH (ref 5–15)
BUN: 42 mg/dL — AB (ref 6–23)
CO2: 24 meq/L (ref 19–32)
CREATININE: 5.76 mg/dL — AB (ref 0.50–1.10)
Calcium: 7.9 mg/dL — ABNORMAL LOW (ref 8.4–10.5)
Chloride: 87 mEq/L — ABNORMAL LOW (ref 96–112)
GFR calc Af Amer: 8 mL/min — ABNORMAL LOW (ref >90)
GFR calc non Af Amer: 7 mL/min — ABNORMAL LOW (ref >90)
GLUCOSE: 60 mg/dL — AB (ref 70–99)
POTASSIUM: 5.2 meq/L (ref 3.7–5.3)
Sodium: 129 mEq/L — ABNORMAL LOW (ref 137–147)

## 2014-08-28 LAB — TROPONIN I
Troponin I: <0.3 ng/mL (ref <0.30)
Troponin I: <0.3 ng/mL (ref <0.30)

## 2014-08-28 LAB — URINE MICROSCOPIC-ADD ON

## 2014-08-28 LAB — CBC
HCT: 33.4 % — ABNORMAL LOW (ref 36.0–46.0)
Hemoglobin: 9.7 g/dL — ABNORMAL LOW (ref 12.0–15.0)
MCH: 28.4 pg (ref 26.0–34.0)
MCHC: 29 g/dL — AB (ref 30.0–36.0)
MCV: 97.9 fL (ref 78.0–100.0)
Platelets: 187 10*3/uL (ref 150–400)
RBC: 3.41 MIL/uL — ABNORMAL LOW (ref 3.87–5.11)
RDW: 16.1 % — ABNORMAL HIGH (ref 11.5–15.5)
WBC: 4.4 10*3/uL (ref 4.0–10.5)

## 2014-08-28 LAB — GLUCOSE, CAPILLARY
GLUCOSE-CAPILLARY: 70 mg/dL (ref 70–99)
GLUCOSE-CAPILLARY: 88 mg/dL (ref 70–99)
Glucose-Capillary: 111 mg/dL — ABNORMAL HIGH (ref 70–99)
Glucose-Capillary: 75 mg/dL (ref 70–99)

## 2014-08-28 MED ORDER — HEPARIN SODIUM (PORCINE) 1000 UNIT/ML DIALYSIS
1000.0000 [IU] | INTRAMUSCULAR | Status: DC | PRN
  Filled 2014-08-28: qty 1

## 2014-08-28 MED ORDER — SODIUM CHLORIDE 0.9 % IV SOLN: 100.0000 mL | INTRAVENOUS | Status: DC | PRN

## 2014-08-28 MED ORDER — NEPRO/CARBSTEADY PO LIQD: 237.0000 mL | ORAL | Status: DC | PRN

## 2014-08-28 MED ORDER — BOOST / RESOURCE BREEZE PO LIQD
1.0000 | Freq: Two times a day (BID) | ORAL | Status: DC
  Administered 2014-08-29 – 2014-08-30 (×4): 1 via ORAL

## 2014-08-28 MED ORDER — METRONIDAZOLE IN NACL 5-0.79 MG/ML-% IV SOLN
500.0000 mg | Freq: Three times a day (TID) | INTRAVENOUS | Status: DC
  Administered 2014-08-28 – 2014-08-30 (×5): 500 mg via INTRAVENOUS
  Filled 2014-08-28 (×7): qty 100

## 2014-08-28 MED ORDER — ALTEPLASE 2 MG IJ SOLR
2.0000 mg | Freq: Once | INTRAMUSCULAR | Status: AC | PRN
  Filled 2014-08-28: qty 2

## 2014-08-28 MED ORDER — CIPROFLOXACIN IN D5W 400 MG/200ML IV SOLN
400.0000 mg | Freq: Every day | INTRAVENOUS | Status: DC
  Administered 2014-08-28 – 2014-08-29 (×2): 400 mg via INTRAVENOUS
  Filled 2014-08-28 (×3): qty 200

## 2014-08-28 MED ORDER — LIDOCAINE-PRILOCAINE 2.5-2.5 % EX CREA: 1.0000 "application " | TOPICAL_CREAM | CUTANEOUS | Status: DC | PRN

## 2014-08-28 MED ORDER — PENTAFLUOROPROP-TETRAFLUOROETH EX AERO: 1.0000 "application " | INHALATION_SPRAY | CUTANEOUS | Status: DC | PRN

## 2014-08-28 MED ORDER — METRONIDAZOLE IN NACL 5-0.79 MG/ML-% IV SOLN
500.0000 mg | Freq: Two times a day (BID) | INTRAVENOUS | Status: DC
  Filled 2014-08-28 (×2): qty 100

## 2014-08-28 MED ORDER — LIDOCAINE HCL (PF) 1 % IJ SOLN: 5.0000 mL | INTRAMUSCULAR | Status: DC | PRN

## 2014-08-28 NOTE — Consult Note (Signed)
Subjective:   HPI  The patient is a 73 year old female with multiple medical problems as described below. We are asked to see her in regards to abdominal pain and diarrhea. She does have a past history of Clostridium difficile which she was treated for and her most recent Clostridium difficile toxin by PCR was negative a day or 2 ago. She states that she's been having diarrhea for quite some time. She basically states that every time she eats she has to have a bowel movement. Also she gets up at night and has to have a bowel movement. She does not see blood in the stool. She has a history of diverticulosis and also a history of a colovaginal fistula. She saw Dr. Marcello Moores from surgery in regards to this in the past but due to her multiple medical problems it was felt too risky to operate on her, and the patient states her cardiologist did not want her to go through surgery. In the past she used to notice stool, both of her rectum and vagina but it it is no longer having out of her vagina, and she states that it was felt that the fistula sealed. Last week she started to experience lower abdominal pain. A CT scan of the abdomen and pelvis yesterday shows evidence of diverticulitis in the sigmoid colon. The patient had a colonoscopy last year which showed diverticulosis. Review of Systems Currently denies chest pain. She does have shortness of breath.  Past Medical History  Diagnosis Date  . Carotid artery occlusion     bilat CEA in 2012  . C. 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 Hypertension   . Atrial fibrillation April  2013  . Pulmonary hypertension   . Secondary hyperparathyroidism, renal   . COPD (chronic obstructive pulmonary disease)   . Hypercholesterolemia   . History of MI (myocardial infarction)     "they say I had 2 years ago" (08/20/2012)  . GERD (gastroesophageal reflux disease)   . History of pneumonia     "have it alot" (08/20/2012)  . Sleep apnea     "don't  wear mask anymore"  . Type II diabetes mellitus   . History of blood transfusion 2013    "3 times so far in 2013:  4 pints one time; 2 pints another; ?# last time" (08/20/2012)  . Iron deficiency anemia   . Seizures 2012    after carotid surgery  . ESRD on dialysis     "[place]; Tues, Thurs; Sat"  . On home oxygen therapy     "24/7"   . Acute diverticulitis     Oct 2013, treated medically [facility], sigmoid diverticulitits  . CHF (congestive heart failure)   . Seizure disorder March 2011  . Myocardial infarction   . Renal cell carcinoma 2005?  Marland Kitchen Cancer of kidney 2005  . Renal insufficiency   . Coronary artery disease     remote MI with PCI   Past Surgical History  Procedure Laterality Date  . Nephrectomy  2005    Right, for Renal cell carcinoma  . Carotid endarterectomy  2012    bilaterally; Dr. Kellie Simmering  . Av fistula placement  Jan. 7, 2011    Right  upper arm by Dr. Kellie Simmering  . Hd catheter  Aug. 22, 2013    [facility]  . Appendectomy      childhood  . Abdominal hysterectomy  1971?  Marland Kitchen Coronary angioplasty with stent placement  1990's    "  1 total"  . Coronary angioplasty  1990's  . Av fistula repair  2013    right upper arm  . Esophagogastroduodenoscopy N/A 12/29/2012    Procedure: ESOPHAGOGASTRODUODENOSCOPY (EGD);  Surgeon: Jeryl Columbia, MD;  Location: [facility] ENDOSCOPY;  Service: Endoscopy;  Laterality: N/A;  . Colonoscopy with propofol N/A 12/31/2012    Procedure: COLONOSCOPY WITH PROPOFOL;  Surgeon: Jeryl Columbia, MD;  Location: [facility] ENDOSCOPY;  Service: Endoscopy;  Laterality: N/A;  currently IP at [place] History  . Marital Status: Widowed    Spouse Name: N/A    Number of Children: N/A  . Years of Education: N/A   Occupational History  . Retired    Social History Main Topics  . Smoking status: Former Smoker -- 1.50 packs/day for 45 years    Types: Cigarettes    Quit date: 09/30/2003  . Smokeless tobacco: Never Used  . Alcohol Use:  No  . Drug Use: No  . Sexual Activity: No   Other Topics Concern  . Not on file   Social History Narrative   family history includes Cancer in her brother and mother; Coronary artery disease in her father; Diabetes in her mother; Emphysema in her father; Heart attack in her father; Heart disease (age of onset: 35) in her father; Hyperlipidemia in her father; Hypertension in her father; Kidney disease in her brother; Pancreatic cancer in her mother; Stroke in her father. Current facility-administered medications: 0.9 %  sodium chloride infusion, 100 mL, Intravenous, PRN, Sherril Croon, MD;  0.9 %  sodium chloride infusion, 100 mL, Intravenous, PRN, Sherril Croon, MD;  acetaminophen (TYLENOL) tablet 650 mg, 650 mg, Oral, Q6H PRN **OR** acetaminophen (TYLENOL) suppository 650 mg, 650 mg, Rectal, Q6H PRN, Debbe Odea, MD;  alteplase (CATHFLO ACTIVASE) injection 2 mg, 2 mg, Intracatheter, Once PRN, Sherril Croon, MD alum & mag hydroxide-simeth (MAALOX/MYLANTA) 200-200-20 MG/5ML suspension 30 mL, 30 mL, Oral, Q6H PRN, Debbe Odea, MD;  amiodarone (PACERONE) tablet 200 mg, 200 mg, Oral, Daily, Debbe Odea, MD, 200 mg at 08/06/2014 2219;  antiseptic oral rinse (CPC / CETYLPYRIDINIUM CHLORIDE 0.05%) solution 7 mL, 7 mL, Mouth Rinse, BID, Debbe Odea, MD, 7 mL at 08/28/2014 2219;  aspirin chewable tablet 81 mg, 81 mg, Oral, Daily PRN, Debbe Odea, MD calcium acetate (PHOSLO) capsule 1,334 mg, 1,334 mg, Oral, TID WC, Debbe Odea, MD, 1,334 mg at 08/28/14 0800;  calcium acetate (PHOSLO) capsule 667 mg, 667 mg, Oral, BID BM, Debbe Odea, MD;  ezetimibe (ZETIA) tablet 10 mg, 10 mg, Oral, QHS, Debbe Odea, MD, 10 mg at 08/07/2014 2219;  feeding supplement (NEPRO CARB STEADY) liquid 237 mL, 237 mL, Oral, PRN, Sherril Croon, MD heparin injection 1,000 Units, 1,000 Units, Dialysis, PRN, Sherril Croon, MD;  heparin injection 5,000 Units, 5,000 Units, Subcutaneous, 3 times per day, Debbe Odea, MD, 5,000 Units at  08/28/2014 2219;  HYDROcodone-acetaminophen (NORCO/VICODIN) 5-325 MG per tablet 0.5 tablet, 0.5 tablet, Oral, Q6H PRN, Debbe Odea, MD, 0.5 tablet at 08/02/2014 1818 insulin aspart (novoLOG) injection 0-5 Units, 0-5 Units, Subcutaneous, QHS, Debbe Odea, MD, 0 Units at 08/14/2014 2210;  insulin aspart (novoLOG) injection 0-9 Units, 0-9 Units, Subcutaneous, TID WC, Saima Rizwan, MD, 0 Units at 08/28/14 0800;  ipratropium-albuterol (DUONEB) 0.5-2.5 (3) MG/3ML nebulizer solution 3 mL, 3 mL, Nebulization, Q4H PRN, Debbe Odea, MD, 3 mL at 08/28/14 0523 lidocaine (PF) (XYLOCAINE) 1 % injection 5 mL, 5 mL, Intradermal, PRN, Sherril Croon, MD;  lidocaine-prilocaine (EMLA) cream 1 application, 1 application, Topical, PRN, Sherril Croon, MD;  nitroGLYCERIN (NITROGLYN) 2 % ointment 0.5 inch, 0.5 inch, Topical, 4 times per day, Debbe Odea, MD, 0.5 inch at 08/28/14 0313 [DISCONTINUED] ondansetron (ZOFRAN) tablet 4 mg, 4 mg, Oral, Q6H PRN **OR** ondansetron (ZOFRAN) injection 4 mg, 4 mg, Intravenous, Q6H PRN, Debbe Odea, MD;  ondansetron (ZOFRAN-ODT) disintegrating tablet 4 mg, 4 mg, Oral, Q8H PRN, Debbe Odea, MD, 4 mg at 08/23/2014 1818;  pentafluoroprop-tetrafluoroeth (GEBAUERS) aerosol 1 application, 1 application, Topical, PRN, Sherril Croon, MD rOPINIRole (REQUIP) tablet 1 mg, 1 mg, Oral, BID, Debbe Odea, MD, 1 mg at 08/22/2014 2219 Allergies  Allergen Reactions  . Lipitor [Atorvastatin] Other (See Comments)    Causes weakness and drowsiness  . Morphine And Related Nausea And Vomiting  . Nsaids Other (See Comments)    Unknown reported by previous hospital     Objective:     BP 154/65 mmHg  Pulse 79  Temp(Src) 98 F (36.7 C) (Oral)  Resp 18  Ht 5\' 4"  (1.626 m)  Wt 67.4 kg (148 lb 9.4 oz)  BMI 25.49 kg/m2  SpO2 92%  She is currently in dialysis. She is alert and oriented and in no distress. Lungs decreased breath sounds Laboratory Heart regular rhythm  Abdomen: Bowel sounds present, soft, mild  tenderness in the left lower quadrant, no rebound pain  Assessment:     #1. Diverticulitis  #2. No current evidence of Clostridium difficile  #3. Multiple medical problems as described above  #4. Diarrhea, this could be related to her overall diverticular disease. She describes postprandial bowel movements. GI pathogens panel is pending. If it is negative I would treat her diarrhea symptomatically.      Plan:     In view of the findings of acute diverticulitis I would recommend beginning antibiotic therapy for this. We will begin Cipro and Flagyl. Continue clear liquids for now. Follow clinically. Lab Results  Component Value Date   HGB 9.7* 08/28/2014   HGB 9.5* 08/17/2014   HGB 10.4* 08/26/2014   HCT 33.4* 08/28/2014   HCT 33.4* 08/21/2014   HCT 36.0 08/08/2014   ALKPHOS 133* 08/19/2014   ALKPHOS 85 06/12/2014   ALKPHOS 80 06/10/2014   AST 16 08/14/2014   AST 13 06/12/2014   AST 10 06/10/2014   ALT 11 08/22/2014   ALT 6 06/12/2014   ALT 6 06/10/2014

## 2014-08-28 NOTE — Progress Notes (Signed)
INITIAL NUTRITION ASSESSMENT  Pt meets criteria for SEVERE MALNUTRITION in the context of chronic illness as evidenced by a 12.9% weight loss in 5 months and energy intake </= 75% for >/= 1 month.  DOCUMENTATION CODES Per approved criteria  -Severe malnutrition in the context of chronic illness   INTERVENTION: Provide Resource Breeze po BID, each supplement provides 250 kcal and 9 grams of protein.  Monitor magnesium, potassium, and phosphorus daily for at least 3 days, MD to replete as needed, as pt is at risk for refeeding syndrome given extremely poor PO intake over the past 2 weeks.  Encourage PO intake.  NUTRITION DIAGNOSIS: Inadequate oral intake related to decreased appetite, n/v, diarrhea as evidenced by pt report.   Goal: Pt to meet >/= 90% of their estimated nutrition needs   Monitor:  PO intake, weight trends, labs, I/O's  Reason for Assessment: MST  73 y.o. 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 Patient comes in to the hospital with a complaint of abdominal pain nausea vomiting and diarrhea.  Pt reports a decreased appetite that has been ongoing over the past 3 months eating one to two meals. Pt reports especially over the past 2 weeks, pt has been having a lack of appetite and has been eating only 1 meal a day and reports her meal is very small. Daughter reports pt has been eating very little to none. Pt at risk for refeeding syndrome. Pt is currently on a clear liquid diet and reports once she started eat a little bit, she started to having severe abdominal pains. Pt reports she has lost weight which has  been ongoing, since she started dialysis. Pt with a 12.9% weight loss in 5 months. Pt is agreeable to [employer]. Will order.  Unable to perform nutrition focused physical exam during this time as pt reports severe stomach pains and was on bed pan.   Height: Ht Readings from Last 1 Encounters:  08/07/2014 5\' 4"  (1.626 m)    Weight: Wt Readings from Last 1 Encounters:  08/28/14 148 lb 9.4 oz (67.4 kg)    Ideal Body Weight: 120 lbs  % Ideal Body Weight: 123%  Wt Readings from Last 10 Encounters:  08/28/14 148 lb 9.4 oz (67.4 kg)  07/11/14 152 lb 6.4 oz (69.128 kg)  06/24/14 154 lb 12.8 oz (70.217 kg)  06/13/14 158 lb 8 oz (71.895 kg)  05/27/14 152 lb (68.947 kg)  05/26/14 155 lb 13.8 oz (70.7 kg)  05/09/14 164 lb (74.39 kg)  05/04/14 163 lb 9.3 oz (74.2 kg)  04/19/14 165 lb 9.1 oz (75.101 kg)  02/28/14 170 lb 13.7 oz (77.5 kg)    Usual Body Weight: 170 lbs  % Usual Body Weight: 87%  BMI:  Body mass index is 25.49 kg/(m^2).  Estimated Nutritional Needs: Kcal: 1900-2100 Protein: 85-95 grams Fluid: Per MD  Skin: intact  Diet Order: Diet clear liquid  EDUCATION NEEDS: -No education needs identified at this time   Intake/Output Summary (Last 24 hours) at 08/28/14 0937 Last data filed at 08/16/2014 2132  Gross per 24 hour  Intake    580 ml  Output      0 ml  Net    580 ml    Last BM: 11/30  Labs:   Recent Labs Lab 08/07/2014 1150 08/10/2014 1935 08/28/14 0042  NA 134*  --  129*  K 5.2  --  5.2  CL 90*  --  87*  CO2 30  --  24  BUN 39*  --  42*  CREATININE 5.42* 5.74* 5.76*  CALCIUM 8.5  --  7.9*  GLUCOSE 75  --  60*    CBG (last 3)   Recent Labs  08/25/2014 1853 07/30/2014 1922 08/10/2014 2132  GLUCAP 64* 70 80    Scheduled Meds: . amiodarone  200 mg Oral Daily  . antiseptic oral rinse  7 mL Mouth Rinse BID  . calcium acetate  1,334 mg Oral TID WC  . calcium acetate  667 mg Oral BID BM  . ciprofloxacin  400 mg Intravenous QHS  . ezetimibe   10 mg Oral QHS  . heparin  5,000 Units Subcutaneous 3 times per day  . insulin aspart  0-5 Units Subcutaneous QHS  . insulin aspart  0-9 Units Subcutaneous TID WC  . metronidazole  500 mg Intravenous Q12H  . nitroGLYCERIN  0.5 inch Topical 4 times per day  . rOPINIRole  1 mg Oral BID    Continuous Infusions:   Past Medical History  Diagnosis Date  . Carotid artery occlusion     bilat CEA in 2012  . C. 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 Hypertension   . Atrial fibrillation April  2013  . Pulmonary hypertension   . Secondary hyperparathyroidism, renal   . COPD (chronic obstructive pulmonary disease)   . Hypercholesterolemia   . History of MI (myocardial infarction)     "they say I had 2 years ago" (08/20/2012)  . GERD (gastroesophageal reflux disease)   . History of pneumonia     "have it alot" (08/20/2012)  . Sleep apnea     "don't wear mask anymore"  . Type II diabetes mellitus   . History of blood transfusion 2013    "3 times so far in 2013:  4 pints one time; 2 pints another; ?# last time" (08/20/2012)  . Iron deficiency anemia   . Seizures 2012    after carotid surgery  . ESRD on dialysis     "[place]; Tues, Thurs; Sat"  . On home oxygen therapy     "24/7"   . Acute diverticulitis     Oct 2013, treated medically [facility], sigmoid diverticulitits  . CHF (congestive heart failure)   . Seizure disorder March 2011  . Myocardial infarction   . Renal cell carcinoma 2005?  Marland Kitchen Cancer of kidney 2005  . Renal insufficiency   . Coronary artery disease     remote MI with PCI    Past Surgical History  Procedure Laterality Date  . Nephrectomy  2005    Right, for Renal cell carcinoma  . Carotid endarterectomy  2012    bilaterally; Dr. Kellie Simmering  . Av fistula placement  Jan. 7, 2011    Right  upper arm by Dr. Kellie Simmering  . Hd catheter  Aug. 22, 2013    [facility]  . Appendectomy      childhood  . Abdominal hysterectomy  1971?  Marland Kitchen  Coronary angioplasty with stent placement  1990's    "1 total"  . Coronary angioplasty  1990's  . Av fistula repair  2013    right upper arm  .  Esophagogastroduodenoscopy N/A 12/29/2012    Procedure: ESOPHAGOGASTRODUODENOSCOPY (EGD);  Surgeon: Jeryl Columbia, MD;  Location: [facility] ENDOSCOPY;  Service: Endoscopy;  Laterality: N/A;  . Colonoscopy with propofol N/A 12/31/2012    Procedure: COLONOSCOPY WITH PROPOFOL;  Surgeon: Jeryl Columbia, MD;  Location: [facility] ENDOSCOPY;  Service: Endoscopy;  Laterality: N/A;  currently IP at [place], MS, RD, LDN Pager # [phone] After hours/ weekend pager # [phone]

## 2014-08-28 NOTE — Procedures (Signed)
I was present at this dialysis session, have reviewed the session itself and made  appropriate changes  Kelly Splinter MD (pgr) [phone]    ([phone] 08/28/2014, 11:42 AM

## 2014-08-28 NOTE — Consult Note (Signed)
Renal Service Consult Note [name]  IMA HAFNER 08/28/2014 [place] Requesting Physician:  Dr Eliseo Squires  Reason for Consult:  ESRD pt with SOB, diarrhea, abd pain HPI: The patient is a 73 y.o. year-old with hx of ESRD, COPD on home O2, HTN, PVD s/p bilat CEA. She presented with SOB to ED yesterday and was admitted last night.  CXR showed pulm edema. Also having diarrhea several days, long hx of recurrent Cdif. Also abd pain. Cdif was negative, CT abd was ordered.  Clear liquids.  Had HD this am for vol overload.  Has some dysuria, no fecal matter passing from vagina lately.    Chart review: 1/11 - R CEA w Dacron patch angioplasty 3/11 - L CEA with w 10 French shunt and DAcro patch angioplasty 10/13 - diverticulitis of sigmoid colon, ESRD on HD, DM2, anemia, puml edema, Cdif diarrhea 11/13 - acute/ chronic resp failure d/t vol excess. DNR was ordered. COPD on home O2 3L, po vanc for Cdif 12/13 - HTN crisis, acute resp failure/ puml edema, COPD, AMS, Cdif colitis, ESRD , anemia 1/14 - A/C resp failure, ESRD, DM, PNA 4/14 - UGIB, EGD showed minimal antritis and possible duod erosion. Int/ext hemorrhoids on colonoscopy. Significant sigmoid diverticuli.  9/14 - A/C resp failure, pulm edema, recurrent afib with RVR, CP w neg trop x 3, DM, Cdif recurrent, ESRD 11/14 - Junctional bradycardia d/t meds (BB, dilt, amio) ; dilt/ coreg held, amio cut in half, resumed coumadin (stoppeed 4/14 d/t GIB) 5/15 - Legionella PNA, ESRD, DM, pulm HTN, anemia, COPD on home O2 > urine Ag was + for legionella, abx changed to Levaquin. Was not on coumadin at time of admit, although last hosp stay was said to be resumed; was dc'd not on coumadin to f/u with PCP re this matter 7/15 - A/C resp failure d/t pulm edema, vol excess, and acute COPD exac > rx with abx, steroids, HD, O2, nebs 8/3 - 05/04/14 > abd pain, CT showed diverticulitis, rx'd with IV then po abx, dc'd on cipro./flagyl total  10days 8/23 - 05/26/14 > A/C resp failure d/t fluid overload +/- PNA, rx with HD, IV then po abx, 3L O2 as at home 9/8 - 06/13/14 > abd pain d/t colovaginal fistula, HCAP, Cdif colitis, A/C resp failure, DM, HTN, esrd, OSA, COPD; treated with IV abx, and TNA, supportive care. No surgery at the present per Gen Surg, advanced diet and had no further symptoms. TNA was stopped, OP f/u with Dr Leighton Ruff.  9/20 - 06/23/14 > pulm edema, acute resp failure, afib with RVR, hypotension, CAD, dilated CM, ESRD on HD, hx recurrent Cdif, COPD home O2, colovaginal fistula; SOB resolved with HD. EF 40% by echo. R and LHC on 9/23, moderate CAD involving all 3 vessels (50% proximal LAD, 50-70% first obtuse marginal, 70% ostial RCA, and 50-70% segmental proximal RCA. The stent in the proximal LAD was widely patent with 80% stenosis noted in the ostium and proximal portion of the first diagonal); medical Rx recommended, cont asa/ MTP.     ROS  no fevers  n chils  no HA  no blurred vision  Past Medical History  Past Medical History  Diagnosis Date  . Carotid artery occlusion     bilat CEA in 2012  . C. 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 Hypertension   . Atrial fibrillation April  2013  . Pulmonary hypertension   . Secondary hyperparathyroidism, renal   .  COPD (chronic obstructive pulmonary disease)   . Hypercholesterolemia   . History of MI (myocardial infarction)     "they say I had 2 years ago" (08/20/2012)  . GERD (gastroesophageal reflux disease)   . History of pneumonia     "have it alot" (08/20/2012)  . Sleep apnea     "don't wear mask anymore"  . Type II diabetes mellitus   . History of blood transfusion 2013    "3 times so far in 2013:  4 pints one time; 2 pints another; ?# last time" (08/20/2012)  . Iron deficiency anemia   . Seizures 2012    after carotid surgery  . ESRD on dialysis     "[place]; Tues, Thurs; Sat"  . On home oxygen therapy     "24/7"   .  Acute diverticulitis     Oct 2013, treated medically [facility], sigmoid diverticulitits  . CHF (congestive heart failure)   . Seizure disorder March 2011  . Myocardial infarction   . Renal cell carcinoma 2005?  Marland Kitchen Cancer of kidney 2005  . Renal insufficiency   . Coronary artery disease     remote MI with PCI   Past Surgical History  Past Surgical History  Procedure Laterality Date  . Nephrectomy  2005    Right, for Renal cell carcinoma  . Carotid endarterectomy  2012    bilaterally; Dr. Kellie Simmering  . Av fistula placement  Jan. 7, 2011    Right  upper arm by Dr. Kellie Simmering  . Hd catheter  Aug. 22, 2013    [facility]  . Appendectomy      childhood  . Abdominal hysterectomy  1971?  Marland Kitchen Coronary angioplasty with stent placement  1990's    "1 total"  . Coronary angioplasty  1990's  . Av fistula repair  2013    right upper arm  . Esophagogastroduodenoscopy N/A 12/29/2012    Procedure: ESOPHAGOGASTRODUODENOSCOPY (EGD);  Surgeon: Jeryl Columbia, MD;  Location: [facility] ENDOSCOPY;  Service: Endoscopy;  Laterality: N/A;  . Colonoscopy with propofol N/A 12/31/2012    Procedure: COLONOSCOPY WITH PROPOFOL;  Surgeon: Jeryl Columbia, MD;  Location: [facility] ENDOSCOPY;  Service: Endoscopy;  Laterality: N/A;  currently IP at [place] History  Family History  Problem Relation Age of Onset  . Cancer Mother     pancreatic  . Diabetes Mother   . Stroke Father   . Coronary artery disease Father   . Heart disease Father 26    Heart Disease before age 59  . Hyperlipidemia Father   . Hypertension Father   . Heart attack Father   . Cancer Brother     Brain  . Kidney disease Brother     Kidney stones  . Emphysema Father     smoked  . Pancreatic cancer Mother    Social History  reports that she quit smoking about 10 years ago. Her smoking use included Cigarettes. She has a 67.5 pack-year smoking history. She has never used smokeless tobacco. She reports that she does not drink alcohol or use  illicit drugs. Allergies  Allergies  Allergen Reactions  . Lipitor [Atorvastatin] Other (See Comments)    Causes weakness and drowsiness  . Morphine And Related Nausea And Vomiting  . Nsaids Other (See Comments)    Unknown reported by previous hospital   Home medications Prior to Admission medications   Medication Sig Start Date End Date Taking? Authorizing Provider  albuterol (PROAIR HFA) 108 (90 BASE)  MCG/ACT inhaler 2 puffs every 4 hours as needed only  if your can't catch your breath 07/11/14  Yes Tanda Rockers, MD  albuterol (PROVENTIL) (2.5 MG/3ML) 0.083% nebulizer solution Take 2.5 mg by nebulization every 6 (six) hours as needed for wheezing or shortness of breath.   Yes Historical Provider, MD  amiodarone (PACERONE) 200 MG tablet Take 200 mg by mouth daily.   Yes Historical Provider, MD  aspirin 81 MG tablet Take 81 mg by mouth daily as needed for pain (chest pain).   Yes Historical Provider, MD  calcium acetate (PHOSLO) 667 MG capsule Take 667-1,334 mg by mouth 3 (three) times daily with meals. 2 capsules TID with meals, and 1 capsule BID with snacks   Yes Historical Provider, MD  ezetimibe (ZETIA) 10 MG tablet Take 10 mg by mouth at bedtime.   Yes Historical Provider, MD  HYDROcodone-acetaminophen (NORCO/VICODIN) 5-325 MG per tablet Take 0.5 tablets by mouth every 6 (six) hours as needed for moderate pain.   Yes Historical Provider, MD  ipratropium (ATROVENT HFA) 17 MCG/ACT inhaler Inhale 2 puffs into the lungs every 6 (six) hours as needed for wheezing.   Yes Historical Provider, MD  multivitamin (RENA-VIT) TABS tablet Take 1 tablet by mouth daily with lunch.   Yes Historical Provider, MD  omeprazole (PRILOSEC) 40 MG capsule Take 40 mg by mouth daily.   Yes Historical Provider, MD  ondansetron (ZOFRAN-ODT) 4 MG disintegrating tablet Take 1 tablet (4 mg total) by mouth every 8 (eight) hours as needed for nausea or vomiting. 05/27/14  Yes Everlene Balls, MD  OXYGEN Inhale 3 L into the  lungs daily.   Yes Historical Provider, MD  rOPINIRole (REQUIP) 1 MG tablet Take 1 mg by mouth 2 (two) times daily.   Yes Historical Provider, MD  amiodarone (PACERONE) 400 MG tablet Take 1 tablet (400 mg total) by mouth daily. Patient not taking: Reported on 08/16/2014 06/23/14   Domenic Polite, MD  dicyclomine (BENTYL) 10 MG capsule Take 10 mg by mouth daily as needed for spasms.    Historical Provider, MD  metoCLOPramide (REGLAN) 5 MG tablet Take 5 mg by mouth 2 (two) times daily.    Historical Provider, MD   Liver Function Tests  Recent Labs Lab 08/22/2014 1150  AST 16  ALT 11  ALKPHOS 133*  BILITOT 0.5  PROT 5.7*  ALBUMIN 2.2*    Recent Labs Lab 08/07/2014 1150  LIPASE 10*   CBC  Recent Labs Lab 08/08/2014 1150 08/21/2014 2025 08/28/14 0725  WBC 12.0* 9.4 4.4  NEUTROABS 10.2*  --   --   HGB 10.4* 9.5* 9.7*  HCT 36.0 33.4* 33.4*  MCV 101.7* 99.1 97.9  PLT 211 186 983   Basic Metabolic Panel  Recent Labs Lab 08/11/2014 1150 08/24/2014 1935 08/28/14 0042  NA 134*  --  129*  K 5.2  --  5.2  CL 90*  --  87*  CO2 30  --  24  GLUCOSE 75  --  60*  BUN 39*  --  42*  CREATININE 5.42* 5.74* 5.76*  CALCIUM 8.5  --  7.9*    Filed Vitals:   08/28/14 1030 08/28/14 1100 08/28/14 1125 08/28/14 1145  BP: 114/81 120/59 112/51 108/50  Pulse: 87 81 82 84  Temp:   98 F (36.7 C) 97.9 F (36.6 C)  TempSrc:   Oral Oral  Resp: 18 20 19 18   Height:      Weight:   63.3 kg (139 lb  8.8 oz)   SpO2:   90% 92%   Exam Alert, frail, elderly, no distress No rash, cyanosis or gangrene Sclera anicteric, throat clear No jvd Chest clear bilat (after HD this am) RRR 2/6 early syst harsh M, no RG Abd tender mid SP and LLQ areas, +BS No LE or UE edema   HD: [facility] 3.5h   F160   65.5kg   2/2.25 [place]    Heparin none Aranesp 120/wk,  Venofer 50 /wk,  Calcitriol 0.25 ug TIW Hb 10.0, tsat 14%, ferr 580, Ca/P 9.4/5.4,  221pth  Assessment: 1. Dyspnea / acute on chronic resp failure -  improving with HD; +pulm edema 2. Abd pain / N / V / diarrhea - Cdif neg, w/u in progress 3. 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 ESRD on HD 5. COPD on home O2 6. 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 Hx afib / RVR 8. Hx recurrent Cdif infection 9. Hx legionella PNA 10. HTN/ vol -    Plan- HD today for vol excess, will follow. Possibly lower dry wt.    Kelly Splinter MD (pgr) [phone]    ([phone] 08/28/2014, 1:16 PM

## 2014-08-28 NOTE — Progress Notes (Signed)
Patient complaining, " I just can't breathe. Please call respiratory for a breathing treatment." Breathing is labored tachypnic. Lung sounds are diminished throughout. Respiratory paged. Jyl Heinz PA notified. Requested for rapid response to evaluate patient as well. [phone]. 95% on 3L

## 2014-08-28 NOTE — Progress Notes (Signed)
PROGRESS NOTE  [name] BJY:[id number] DOB: December 29, 1940 DOA: 07/30/2014 PCP: Gilford Rile, MD  Assessment/Plan: Abdominal pain, vomiting, and diarrhea- diverticulitis -C. difficile PCR negative -Will obtain a GI pathogen panel  -will place on clear liquids for now-advance as tolerated -Her med rec states that she is on metoclopramide and Bentyl at home however she states that she is no longer taking these -cipro/flagyl    CKD (chronic kidney disease) stage V requiring chronic dialysis- with fluid overload -Hd Monday   CHF (congestive heart failure)- acute on chronic systolic heart failure -Last echo (06/19/2014 ) reveals an EF of 40%- not on nitroglycerin/hydralazine combination - has chronic pleural effusions which are worse when compared to prior x-ray in September -Will allow renal team to manage with dialysis  Epigastric pain -Not certain if this is related to above-mentioned GI symptoms or if it is cardiac.  -EKG is unchanged from prior   ?UTI -culture pending -HD patient   Atrial fibrillation- paroxysmal -200 mg of amiodarone daily -She is not on anticoagulation   COPD GOLD II -Continue inhalers when necessary and 3 L of oxygen  Diabetes mellitus 2 -This is mainly diet controlled-she was told to take Glucotrol if her blood sugar is greater than 150 and she rarely has to take it- place her on a low-dose sliding scale     Code Status: full Family Communication: patient Disposition Plan:    Consultants:  Renal  GI   Procedures:      HPI/Subjective: Breathing better since dialysis started  Objective: Filed Vitals:   08/28/14 0930  BP: 116/54  Pulse: 78  Temp:   Resp: 19    Intake/Output Summary (Last 24 hours) at 08/28/14 1004 Last data filed at 08/16/2014 2132  Gross per 24 hour  Intake    580 ml  Output      0 ml  Net    580 ml   Filed Weights   08/03/2014 1839 08/28/14 0708  Weight: 66.2 kg (145 lb 15.1 oz) 67.4 kg (148 lb 9.4  oz)    Exam:   General:  Pleasant/cooperative, NAD  Cardiovascular: rrr  Respiratory: no wheezing  Abdomen: +Bs, soft  Musculoskeletal: no edema   Data Reviewed: Basic Metabolic Panel:  Recent Labs Lab 08/05/2014 1150 08/12/2014 1935 08/28/14 0042  NA 134*  --  129*  K 5.2  --  5.2  CL 90*  --  87*  CO2 30  --  24  GLUCOSE 75  --  60*  BUN 39*  --  42*  CREATININE 5.42* 5.74* 5.76*  CALCIUM 8.5  --  7.9*   Liver Function Tests:  Recent Labs Lab 08/18/2014 1150  AST 16  ALT 11  ALKPHOS 133*  BILITOT 0.5  PROT 5.7*  ALBUMIN 2.2*    Recent Labs Lab 08/21/2014 1150  LIPASE 10*   No results for input(s): AMMONIA in the last 168 hours. CBC:  Recent Labs Lab 08/10/2014 1150 07/30/2014 2025 08/28/14 0725  WBC 12.0* 9.4 4.4  NEUTROABS 10.2*  --   --   HGB 10.4* 9.5* 9.7*  HCT 36.0 33.4* 33.4*  MCV 101.7* 99.1 97.9  PLT 211 186 187   Cardiac Enzymes:  Recent Labs Lab 08/02/2014 1935 08/28/14 0042 08/28/14 0620  TROPONINI <0.30 <0.30 <0.30   BNP (last 3 results)  Recent Labs  04/19/14 0410 06/06/14 1620  PROBNP 30133.0* >70000.0*   CBG:  Recent Labs Lab 08/26/2014 1719 08/17/2014 1853 08/18/2014 1922 08/24/2014 2132  GLUCAP 74 64*  70 80    Recent Results (from the past 240 hour(s))  Clostridium Difficile by PCR     Status: None   Collection Time: 08/01/2014  2:14 PM  Result Value Ref Range Status   C difficile by pcr NEGATIVE NEGATIVE Final  MRSA PCR Screening     Status: None   Collection Time: 08/01/2014  6:57 PM  Result Value Ref Range Status   MRSA by PCR NEGATIVE NEGATIVE Final    Comment:        The GeneXpert MRSA Assay (FDA approved for NASAL specimens only), is one component of a comprehensive MRSA colonization surveillance program. It is not intended to diagnose MRSA infection nor to guide or monitor treatment for MRSA infections.      Studies: Dg Chest 2 View  08/14/2014   CLINICAL DATA:  Shortness of breath.  EXAM: CHEST  2  VIEW  COMPARISON:  08/23/2014  FINDINGS: There are bilateral pleural effusions, right side greater the left. The right pleural effusion is moderate for size. Diffuse interstitial lung densities may represent interstitial pulmonary edema. Evidence for a coronary artery stent. Heart size is grossly normal.  IMPRESSION: Bilateral pleural effusions, right side greater the left.  Prominent lung markings suggest interstitial edema.   Electronically Signed   By: Markus Daft M.D.   On: 08/13/2014 13:47   Ct Abdomen Pelvis W Contrast  08/25/2014   CLINICAL DATA:  73 year old female with abdominal pelvic pain, vomiting and diarrhea.  EXAM: CT ABDOMEN AND PELVIS WITH CONTRAST  TECHNIQUE: Multidetector CT imaging of the abdomen and pelvis was performed using the standard protocol following bolus administration of intravenous contrast.  CONTRAST:  126mL OMNIPAQUE IOHEXOL 300 MG/ML  SOLN  COMPARISON:  06/06/2014 and prior CTs  FINDINGS: Moderate to large bilateral pleural effusions have slightly increased with bilateral lower lung atelectasis, moderate to severe on the right and moderate on the left.  The liver, pancreas and adrenal glands are unchanged in appearance with fullness of the left adrenal gland again noted.  Mild gallbladder distention and upper limits of normal CBD size (8 mm) are unchanged from 05/22/2014.  Peripheral low-density areas within the spleen appear unchanged from 05/22/2014 and likely represent infarcts.  The patient is status post right nephrectomy.  Severe left renal atrophy and left renal cysts again noted.  There is no evidence of abdominal aortic aneurysm or enlarged lymph nodes.  The patient is status post hysterectomy.  Slightly increased inflammation/stranding along the distal sigmoid colon noted with mild colonic wall thickening suspicious for diverticulitis. A few tiny foci of extraluminal gas that extends from this region to the vaginal cuff could represent a fistula -correlate clinically  and consider further evaluation.  There is no evidence of abscess or bowel obstruction.  No acute or suspicious bony abnormalities are identified.  IMPRESSION: Slightly increased inflammation/stranding along the distal sigmoid colon with mild adjacent wall thickening -question acute diverticulitis. A few tiny foci of extraluminal gas extends to the vaginal cuff could represent a fistula. Consider further evaluation as indicated.  Enlarging moderate to large bilateral pleural effusions and bilateral lower lung atelectasis, right greater than left.  Mild gallbladder distention and upper limits of normal CBD size -unchanged from 05/22/2014.  Status post right nephrectomy and severe left renal atrophy.   Electronically Signed   By: Hassan Rowan M.D.   On: 08/13/2014 22:25    Scheduled Meds: . amiodarone  200 mg Oral Daily  . antiseptic oral rinse  7 mL Mouth Rinse  BID  . calcium acetate  1,334 mg Oral TID WC  . calcium acetate  667 mg Oral BID BM  . ciprofloxacin  400 mg Intravenous QHS  . ezetimibe  10 mg Oral QHS  . heparin  5,000 Units Subcutaneous 3 times per day  . insulin aspart  0-5 Units Subcutaneous QHS  . insulin aspart  0-9 Units Subcutaneous TID WC  . metronidazole  500 mg Intravenous Q12H  . nitroGLYCERIN  0.5 inch Topical 4 times per day  . rOPINIRole  1 mg Oral BID   Continuous Infusions:  Antibiotics Given (last 72 hours)    None      Principal Problem:   Abdominal pain, vomiting, and diarrhea Active Problems:   CKD (chronic kidney disease) stage V requiring chronic dialysis   HTN (hypertension)   Atrial fibrillation   COPD GOLD II   Acute systolic congestive heart failure   Nausea vomiting and diarrhea    Time spent: 35 min    [place] Zou, [facility] Pager [phone]. If 7PM-7AM, please contact night-coverage at [email or web address], password [facility] 08/28/2014, 10:04 AM  LOS: 1 day

## 2014-08-28 NOTE — Progress Notes (Signed)
Called to room per floor RN for pt with worsening SOB. Upon my arrival pt found resting in bed with NEB treatment. Appears mildly SOB, follows commands, alert oriented X 4. Lung sounds diminished in bilateral bases, expiratory wheezes. After breathing treatment completed pt denies SOB, appears comfortable. Orders reviewed with flor RN. Pt for HD this AM. [facility] on call PA notified of pt SOB. Jyl Heinz PA to notify nephrology and update on pt status. Floor RN advised to notify myself and/or provider for worsening changes.

## 2014-08-29 ENCOUNTER — Ambulatory Visit: Payer: Medicare Other | Admitting: Internal Medicine

## 2014-08-29 DIAGNOSIS — E43 Unspecified severe protein-calorie malnutrition: Secondary | ICD-10-CM | POA: Insufficient documentation

## 2014-08-29 LAB — GLUCOSE, CAPILLARY
GLUCOSE-CAPILLARY: 135 mg/dL — AB (ref 70–99)
GLUCOSE-CAPILLARY: 79 mg/dL (ref 70–99)
Glucose-Capillary: 87 mg/dL (ref 70–99)
Glucose-Capillary: 76 mg/dL (ref 70–99)
Glucose-Capillary: 91 mg/dL (ref 70–99)

## 2014-08-29 MED ORDER — HYDROCODONE-ACETAMINOPHEN 5-325 MG PO TABS
1.0000 | ORAL_TABLET | Freq: Four times a day (QID) | ORAL | Status: DC | PRN
  Administered 2014-08-29 – 2014-09-02 (×14): 1 via ORAL
  Filled 2014-08-29 (×13): qty 1

## 2014-08-29 NOTE — Progress Notes (Signed)
Subjective:  Tolerated  Hd last night / no sob now . Contin diarrhea   Objective Vital signs in last 24 hours: Filed Vitals:   08/28/14 1145 08/28/14 2100 08/29/14 0449 08/29/14 0911  BP: 108/50 144/46 136/53 132/55  Pulse: 84 81 76 86  Temp: 97.9 F (36.6 C) 98.2 F (36.8 C) 97.9 F (36.6 C) 98.2 F (36.8 C)  TempSrc: Oral Oral Oral Oral  Resp: 18 18 18 17   Height:      Weight:      SpO2: 92% 95% 96% 91%   Weight change: 1.2 kg (2 lb 10.3 oz)  Physical Exam: General   Awoken from sleep Alert, no distress  Chest clear bilat (after HD this am)  Heart-RRR 2/6 early syst harsh M, no Rub or gallop Abd- slightly  tender mid SP and LLQ areas, +BS  Extremities-  No LE or UE edema  Dialysis Access:- R U A AVF pos. bruit  HD: [facility] 3.5h F160 65.5kg 2/2.25 [place] Heparin none   RUA AVF Aranesp 120/wk, Venofer 50 /wk, Calcitriol 0.25 ug TIW Hb 10.0, tsat 14%, ferr 580, Ca/P 9.4/5.4, 221pth  Problem/Plan: 1. Dyspnea / acute on chronic resp failure - improved  with HD; +pulm edema/ 4 l uf yest with hd wt to 63.3 below edw 2. Abd pain / N / V / diarrhea - Cdif neg, w/u in progress 3. 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 ESRD - on  MWF HD 5. COPD- on home O2 6. 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 Hx afib / RVR- now improved vr 86 8. 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 Hx legionella PNA  Ernest Haber, PA-C [place] [phone] 08/29/2014,3:57 PM  LOS: 2 days   Pt seen, examined and agree w A/P as above. Losing lean body weight w consequent vol overload. Continue to lower vol with HD today.  Kelly Splinter MD pager [phone]    cell [phone] 08/30/2014, 8:08 AM    Labs: Basic Metabolic Panel:  Recent Labs Lab 08/19/2014 1150 08/14/2014 1935 08/28/14 0042  NA 134*  --  129*  K 5.2  --  5.2  CL 90*  --  87*  CO2 30  --  24  GLUCOSE 75  --  60*  BUN 39*  --  42*  CREATININE  5.42* 5.74* 5.76*  CALCIUM 8.5  --  7.9*   Liver Function Tests:  Recent Labs Lab 08/04/2014 1150  AST 16  ALT 11  ALKPHOS 133*  BILITOT 0.5  PROT 5.7*  ALBUMIN 2.2*    Recent Labs Lab 08/08/2014 1150  LIPASE 10*   No results for input(s): AMMONIA in the last 168 hours. CBC:  Recent Labs Lab 08/28/2014 1150 08/01/2014 2025 08/28/14 0725  WBC 12.0* 9.4 4.4  NEUTROABS 10.2*  --   --   HGB 10.4* 9.5* 9.7*  HCT 36.0 33.4* 33.4*  MCV 101.7* 99.1 97.9  PLT 211 186 187   Cardiac Enzymes:  Recent Labs Lab 08/20/2014 1935 08/28/14 0042 08/28/14 0620  TROPONINI <0.30 <0.30 <0.30   CBG:  Recent Labs Lab 08/28/14 1140 08/28/14 1852 08/28/14 2135 08/29/14 0801 08/29/14 1135  GLUCAP 75 88 87 76 135*    Studies/Results: Ct Abdomen Pelvis W Contrast  08/15/2014   CLINICAL DATA:  73 year old female with abdominal pelvic pain, vomiting and diarrhea.  EXAM: CT ABDOMEN AND PELVIS WITH CONTRAST  TECHNIQUE: Multidetector CT imaging of  the abdomen and pelvis was performed using the standard protocol following bolus administration of intravenous contrast.  CONTRAST:  13mL OMNIPAQUE IOHEXOL 300 MG/ML  SOLN  COMPARISON:  06/06/2014 and prior CTs  FINDINGS: Moderate to large bilateral pleural effusions have slightly increased with bilateral lower lung atelectasis, moderate to severe on the right and moderate on the left.  The liver, pancreas and adrenal glands are unchanged in appearance with fullness of the left adrenal gland again noted.  Mild gallbladder distention and upper limits of normal CBD size (8 mm) are unchanged from 05/22/2014.  Peripheral low-density areas within the spleen appear unchanged from 05/22/2014 and likely represent infarcts.  The patient is status post right nephrectomy.  Severe left renal atrophy and left renal cysts again noted.  There is no evidence of abdominal aortic aneurysm or enlarged lymph nodes.  The patient is status post hysterectomy.  Slightly increased  inflammation/stranding along the distal sigmoid colon noted with mild colonic wall thickening suspicious for diverticulitis. A few tiny foci of extraluminal gas that extends from this region to the vaginal cuff could represent a fistula -correlate clinically and consider further evaluation.  There is no evidence of abscess or bowel obstruction.  No acute or suspicious bony abnormalities are identified.  IMPRESSION: Slightly increased inflammation/stranding along the distal sigmoid colon with mild adjacent wall thickening -question acute diverticulitis. A few tiny foci of extraluminal gas extends to the vaginal cuff could represent a fistula. Consider further evaluation as indicated.  Enlarging moderate to large bilateral pleural effusions and bilateral lower lung atelectasis, right greater than left.  Mild gallbladder distention and upper limits of normal CBD size -unchanged from 05/22/2014.  Status post right nephrectomy and severe left renal atrophy.   Electronically Signed   By: Hassan Rowan M.D.   On: 08/09/2014 22:25   Medications:   . amiodarone  200 mg Oral Daily  . antiseptic oral rinse  7 mL Mouth Rinse BID  . calcium acetate  1,334 mg Oral TID WC  . calcium acetate  667 mg Oral BID BM  . ciprofloxacin  400 mg Intravenous QHS  . ezetimibe  10 mg Oral QHS  . feeding supplement (RESOURCE BREEZE)  1 Container Oral BID BM  . heparin  5,000 Units Subcutaneous 3 times per day  . insulin aspart  0-5 Units Subcutaneous QHS  . insulin aspart  0-9 Units Subcutaneous TID WC  . metronidazole  500 mg Intravenous Q8H  . nitroGLYCERIN  0.5 inch Topical 4 times per day  . rOPINIRole  1 mg Oral BID

## 2014-08-29 NOTE — Progress Notes (Signed)
[facility] Progress Note  Subjective: Still some complaints of abdominal discomfort and some loose stools.  Objective: Vital signs in last 24 hours: Temp:  [97.9 F (36.6 C)-98.6 F (37 C)] 98.6 F (37 C) (12/01 1729) Pulse Rate:  [76-89] 89 (12/01 1729) Resp:  [17-18] 18 (12/01 1729) BP: (127-144)/(46-55) 127/50 mmHg (12/01 1729) SpO2:  [91 %-96 %] 95 % (12/01 1729) Weight change: 1.2 kg (2 lb 10.3 oz)   PE:  She is in no distress  Abdomen: Soft, nontender  Lab Results: Results for orders placed or performed during the hospital encounter of 08/09/2014 (from the past 24 hour(s))  Glucose, capillary     Status: None   Collection Time: 08/28/14  6:52 PM  Result Value Ref Range   Glucose-Capillary 88 70 - 99 mg/dL  Glucose, capillary     Status: None   Collection Time: 08/28/14  9:35 PM  Result Value Ref Range   Glucose-Capillary 87 70 - 99 mg/dL  Glucose, capillary     Status: None   Collection Time: 08/29/14  8:01 AM  Result Value Ref Range   Glucose-Capillary 76 70 - 99 mg/dL  Glucose, capillary     Status: Abnormal   Collection Time: 08/29/14 11:35 AM  Result Value Ref Range   Glucose-Capillary 135 (H) 70 - 99 mg/dL  Glucose, capillary     Status: None   Collection Time: 08/29/14  5:07 PM  Result Value Ref Range   Glucose-Capillary 91 70 - 99 mg/dL    Studies/Results: No results found.    Assessment: Diverticulitis  Plan: Continue Cipro and Flagyl    Paeton Latouche F 08/29/2014, 5:33 PM

## 2014-08-29 NOTE — Plan of Care (Signed)
Problem: Phase I Progression Outcomes Goal: Pain controlled with appropriate interventions Outcome: Completed/Met Date Met:  08/29/14     

## 2014-08-29 NOTE — Progress Notes (Addendum)
PROGRESS NOTE  [name] GXQ:[id number] DOB: 10-Mar-1941 DOA: 07/30/2014 PCP: Gilford Rile, MD  Assessment/Plan: Abdominal pain, vomiting, and diarrhea- diverticulitis -C. difficile PCR negative -GI pathogen panel negative -will place on clear liquids for now-advance as tolerated -Her med rec states that she is on metoclopramide and Bentyl at home however she states that she is no longer taking these -cipro/flagyl   CKD (chronic kidney disease) stage V requiring chronic dialysis- with fluid overload -Hd Monday   CHF (congestive heart failure)- acute on chronic systolic heart failure -Last echo (06/19/2014 ) reveals an EF of 40%- not on nitroglycerin/hydralazine combination - has chronic pleural effusions which are worse when compared to prior x-ray in September -Will allow renal team to manage with dialysis  Epigastric pain -Not certain if this is related to above-mentioned GI symptoms or if it is cardiac.  -EKG is unchanged from prior   ?UTI -culture pending -HD patient   Atrial fibrillation- paroxysmal -200 mg of amiodarone daily -She is not on anticoagulation   COPD GOLD II -Continue inhalers when necessary and 3 L of oxygen  Diabetes mellitus 2 -This is mainly diet controlled-she was told to take Glucotrol if her blood sugar is greater than 150 and she rarely has to take it- place her on a low-dose sliding scale   Protein calorie malnutrition   Code Status: full Family Communication: patient Disposition Plan:    Consultants:  Renal  GI   Procedures:      HPI/Subjective: Breathing ok but c/o N/V/D and abdominal pain  Objective: Filed Vitals:   08/29/14 0911  BP: 132/55  Pulse: 86  Temp: 98.2 F (36.8 C)  Resp: 17    Intake/Output Summary (Last 24 hours) at 08/29/14 1003 Last data filed at 08/29/14 0912  Gross per 24 hour  Intake    240 ml  Output   4000 ml  Net  -3760 ml   Filed Weights   08/06/2014 1839 08/28/14 0708 08/28/14  1125  Weight: 66.2 kg (145 lb 15.1 oz) 67.4 kg (148 lb 9.4 oz) 63.3 kg (139 lb 8.8 oz)    Exam:   General:  Pleasant/cooperative, NAD  Cardiovascular: rrr  Respiratory: no wheezing  Abdomen: +Bs, soft  Musculoskeletal: no edema   Data Reviewed: Basic Metabolic Panel:  Recent Labs Lab 08/04/2014 1150 08/20/2014 1935 08/28/14 0042  NA 134*  --  129*  K 5.2  --  5.2  CL 90*  --  87*  CO2 30  --  24  GLUCOSE 75  --  60*  BUN 39*  --  42*  CREATININE 5.42* 5.74* 5.76*  CALCIUM 8.5  --  7.9*   Liver Function Tests:  Recent Labs Lab 08/13/2014 1150  AST 16  ALT 11  ALKPHOS 133*  BILITOT 0.5  PROT 5.7*  ALBUMIN 2.2*    Recent Labs Lab 08/25/2014 1150  LIPASE 10*   No results for input(s): AMMONIA in the last 168 hours. CBC:  Recent Labs Lab 08/14/2014 1150 08/23/2014 2025 08/28/14 0725  WBC 12.0* 9.4 4.4  NEUTROABS 10.2*  --   --   HGB 10.4* 9.5* 9.7*  HCT 36.0 33.4* 33.4*  MCV 101.7* 99.1 97.9  PLT 211 186 187   Cardiac Enzymes:  Recent Labs Lab 08/22/2014 1935 08/28/14 0042 08/28/14 0620  TROPONINI <0.30 <0.30 <0.30   BNP (last 3 results)  Recent Labs  04/19/14 0410 06/06/14 1620  PROBNP 30133.0* >70000.0*   CBG:  Recent Labs Lab 08/28/14 0940 08/28/14 1140  08/28/14 1852 08/28/14 2135 08/29/14 0801  GLUCAP 111* 75 88 87 76    Recent Results (from the past 240 hour(s))  Clostridium Difficile by PCR     Status: None   Collection Time: 08/25/2014  2:14 PM  Result Value Ref Range Status   C difficile by pcr NEGATIVE NEGATIVE Final  MRSA PCR Screening     Status: None   Collection Time: 08/10/2014  6:57 PM  Result Value Ref Range Status   MRSA by PCR NEGATIVE NEGATIVE Final    Comment:        The GeneXpert MRSA Assay (FDA approved for NASAL specimens only), is one component of a comprehensive MRSA colonization surveillance program. It is not intended to diagnose MRSA infection nor to guide or monitor treatment for MRSA infections.       Studies: Dg Chest 2 View  08/22/2014   CLINICAL DATA:  Shortness of breath.  EXAM: CHEST  2 VIEW  COMPARISON:  08/23/2014  FINDINGS: There are bilateral pleural effusions, right side greater the left. The right pleural effusion is moderate for size. Diffuse interstitial lung densities may represent interstitial pulmonary edema. Evidence for a coronary artery stent. Heart size is grossly normal.  IMPRESSION: Bilateral pleural effusions, right side greater the left.  Prominent lung markings suggest interstitial edema.   Electronically Signed   By: Markus Daft M.D.   On: 08/05/2014 13:47   Ct Abdomen Pelvis W Contrast  08/13/2014   CLINICAL DATA:  73 year old female with abdominal pelvic pain, vomiting and diarrhea.  EXAM: CT ABDOMEN AND PELVIS WITH CONTRAST  TECHNIQUE: Multidetector CT imaging of the abdomen and pelvis was performed using the standard protocol following bolus administration of intravenous contrast.  CONTRAST:  135mL OMNIPAQUE IOHEXOL 300 MG/ML  SOLN  COMPARISON:  06/06/2014 and prior CTs  FINDINGS: Moderate to large bilateral pleural effusions have slightly increased with bilateral lower lung atelectasis, moderate to severe on the right and moderate on the left.  The liver, pancreas and adrenal glands are unchanged in appearance with fullness of the left adrenal gland again noted.  Mild gallbladder distention and upper limits of normal CBD size (8 mm) are unchanged from 05/22/2014.  Peripheral low-density areas within the spleen appear unchanged from 05/22/2014 and likely represent infarcts.  The patient is status post right nephrectomy.  Severe left renal atrophy and left renal cysts again noted.  There is no evidence of abdominal aortic aneurysm or enlarged lymph nodes.  The patient is status post hysterectomy.  Slightly increased inflammation/stranding along the distal sigmoid colon noted with mild colonic wall thickening suspicious for diverticulitis. A few tiny foci of extraluminal  gas that extends from this region to the vaginal cuff could represent a fistula -correlate clinically and consider further evaluation.  There is no evidence of abscess or bowel obstruction.  No acute or suspicious bony abnormalities are identified.  IMPRESSION: Slightly increased inflammation/stranding along the distal sigmoid colon with mild adjacent wall thickening -question acute diverticulitis. A few tiny foci of extraluminal gas extends to the vaginal cuff could represent a fistula. Consider further evaluation as indicated.  Enlarging moderate to large bilateral pleural effusions and bilateral lower lung atelectasis, right greater than left.  Mild gallbladder distention and upper limits of normal CBD size -unchanged from 05/22/2014.  Status post right nephrectomy and severe left renal atrophy.   Electronically Signed   By: Hassan Rowan M.D.   On: 08/23/2014 22:25    Scheduled Meds: . amiodarone  200 mg Oral  Daily  . antiseptic oral rinse  7 mL Mouth Rinse BID  . calcium acetate  1,334 mg Oral TID WC  . calcium acetate  667 mg Oral BID BM  . ciprofloxacin  400 mg Intravenous QHS  . ezetimibe  10 mg Oral QHS  . feeding supplement (RESOURCE BREEZE)  1 Container Oral BID BM  . heparin  5,000 Units Subcutaneous 3 times per day  . insulin aspart  0-5 Units Subcutaneous QHS  . insulin aspart  0-9 Units Subcutaneous TID WC  . metronidazole  500 mg Intravenous Q8H  . nitroGLYCERIN  0.5 inch Topical 4 times per day  . rOPINIRole  1 mg Oral BID   Continuous Infusions:  Antibiotics Given (last 72 hours)    Date/Time Action Medication Dose Rate   08/28/14 1316 Given   ciprofloxacin (CIPRO) IVPB 400 mg 400 mg 200 mL/hr   08/28/14 1843 Given   metroNIDAZOLE (FLAGYL) IVPB 500 mg 500 mg 100 mL/hr   08/29/14 0154 Given   metroNIDAZOLE (FLAGYL) IVPB 500 mg 500 mg 100 mL/hr   08/29/14 0945 Given   metroNIDAZOLE (FLAGYL) IVPB 500 mg 500 mg 100 mL/hr      Principal Problem:   Abdominal pain, vomiting,  and diarrhea Active Problems:   CKD (chronic kidney disease) stage V requiring chronic dialysis   HTN (hypertension)   Atrial fibrillation   COPD GOLD II   Acute systolic congestive heart failure   Nausea vomiting and diarrhea   Protein-calorie malnutrition, severe    Time spent: 25 min    Temima Kutsch  [facility] Pager [phone]. If 7PM-7AM, please contact night-coverage at [email or web address], password [facility] 08/29/2014, 10:03 AM  LOS: 2 days

## 2014-08-29 DEATH — deceased

## 2014-08-30 LAB — BASIC METABOLIC PANEL
Anion gap: 14 (ref 5–15)
BUN: 24 mg/dL — AB (ref 6–23)
CHLORIDE: 96 meq/L (ref 96–112)
CO2: 25 mEq/L (ref 19–32)
Calcium: 8.2 mg/dL — ABNORMAL LOW (ref 8.4–10.5)
Creatinine, Ser: 5.3 mg/dL — ABNORMAL HIGH (ref 0.50–1.10)
GFR calc Af Amer: 8 mL/min — ABNORMAL LOW (ref >90)
GFR calc non Af Amer: 7 mL/min — ABNORMAL LOW (ref >90)
GLUCOSE: 79 mg/dL (ref 70–99)
POTASSIUM: 4.8 meq/L (ref 3.7–5.3)
Sodium: 135 mEq/L — ABNORMAL LOW (ref 137–147)

## 2014-08-30 LAB — GLUCOSE, CAPILLARY
GLUCOSE-CAPILLARY: 67 mg/dL — AB (ref 70–99)
GLUCOSE-CAPILLARY: 81 mg/dL (ref 70–99)
Glucose-Capillary: 108 mg/dL — ABNORMAL HIGH (ref 70–99)
Glucose-Capillary: 115 mg/dL — ABNORMAL HIGH (ref 70–99)

## 2014-08-30 LAB — CBC
HCT: 32.6 % — ABNORMAL LOW (ref 36.0–46.0)
HEMOGLOBIN: 9.2 g/dL — AB (ref 12.0–15.0)
MCH: 28.1 pg (ref 26.0–34.0)
MCHC: 28.2 g/dL — ABNORMAL LOW (ref 30.0–36.0)
MCV: 99.7 fL (ref 78.0–100.0)
Platelets: 158 10*3/uL (ref 150–400)
RBC: 3.27 MIL/uL — ABNORMAL LOW (ref 3.87–5.11)
RDW: 15.9 % — ABNORMAL HIGH (ref 11.5–15.5)
WBC: 5.9 10*3/uL (ref 4.0–10.5)

## 2014-08-30 MED ORDER — METRONIDAZOLE 500 MG PO TABS
500.0000 mg | ORAL_TABLET | Freq: Three times a day (TID) | ORAL | Status: DC
  Administered 2014-08-30 – 2014-09-03 (×11): 500 mg via ORAL
  Filled 2014-08-30 (×15): qty 1

## 2014-08-30 MED ORDER — DARBEPOETIN ALFA 100 MCG/0.5ML IJ SOSY
PREFILLED_SYRINGE | INTRAMUSCULAR | Status: AC
  Filled 2014-08-30: qty 0.5

## 2014-08-30 MED ORDER — DARBEPOETIN ALFA 25 MCG/0.42ML IJ SOSY
PREFILLED_SYRINGE | INTRAMUSCULAR | Status: AC
  Filled 2014-08-30: qty 0.42

## 2014-08-30 MED ORDER — LOPERAMIDE HCL 2 MG PO CAPS
2.0000 mg | ORAL_CAPSULE | ORAL | Status: DC | PRN
  Filled 2014-08-30: qty 1

## 2014-08-30 MED ORDER — SODIUM CHLORIDE 0.9 % IV SOLN
62.5000 mg | INTRAVENOUS | Status: DC
  Administered 2014-08-30: 62.5 mg via INTRAVENOUS
  Filled 2014-08-30 (×2): qty 5

## 2014-08-30 MED ORDER — CIPROFLOXACIN HCL 500 MG PO TABS
500.0000 mg | ORAL_TABLET | Freq: Every day | ORAL | Status: DC
  Administered 2014-08-30 – 2014-09-03 (×3): 500 mg via ORAL
  Filled 2014-08-30 (×5): qty 1

## 2014-08-30 MED ORDER — CALCITRIOL 0.25 MCG PO CAPS
0.2500 ug | ORAL_CAPSULE | ORAL | Status: DC
  Administered 2014-08-30 – 2014-09-01 (×2): 0.25 ug via ORAL
  Filled 2014-08-30 (×3): qty 1

## 2014-08-30 MED ORDER — NEPRO/CARBSTEADY PO LIQD
237.0000 mL | Freq: Every day | ORAL | Status: DC
  Administered 2014-09-02 – 2014-09-03 (×2): 237 mL via ORAL

## 2014-08-30 MED ORDER — BOOST / RESOURCE BREEZE PO LIQD
1.0000 | Freq: Every day | ORAL | Status: DC
  Administered 2014-08-31 – 2014-09-02 (×2): 1 via ORAL

## 2014-08-30 MED ORDER — DARBEPOETIN ALFA 150 MCG/0.3ML IJ SOSY
125.0000 ug | PREFILLED_SYRINGE | INTRAMUSCULAR | Status: DC
  Administered 2014-08-30: 125 ug via INTRAVENOUS

## 2014-08-30 NOTE — Procedures (Signed)
I was present at this dialysis session, have reviewed the session itself and made  appropriate changes  Kelly Splinter MD (pgr) [phone]    [phone] 08/30/2014, 10:04 AM

## 2014-08-30 NOTE — Evaluation (Signed)
Physical Therapy Evaluation Patient Details Name: [name] MRN: [id number] DOB: January 03, 1941 Today's Date: 08/30/2014   History of Present Illness    The patient is a 73 year old female with multiple medical problems as described below. We are asked to see her in regards to abdominal pain and diarrhea. She does have a past history of Clostridium difficile which she was treated for and her most recent Clostridium difficile toxin by PCR was negative a day or 2 ago. She states that she's been having diarrhea for quite some time. She basically states that every time she eats she has to have a bowel movement. Also she gets up at night and has to have a bowel movement. She does not see blood in the stool. She has a history of diverticulosis and also a history of a colovaginal fistula. She saw Dr. Marcello Moores from surgery in regards to this in the past but due to her multiple medical problems it was felt too risky to operate on her, and the patient states her cardiologist did not want her to go through surgery. In the past she used to notice stool, both of her rectum and vagina but it it is no longer having out of her vagina, and she states that it was felt that the fistula sealed. Last week she started to experience lower abdominal pain. A CT scan of the abdomen and pelvis yesterday shows evidence of diverticulitis in the sigmoid colon.   Clinical Impression  Pt admitted with above. Pt currently with functional limitations due to the deficits listed below (see PT Problem List).  Pt may need short term NHP with therapy prior to d/c home as pt states that her daughter is not there all the time.  Pt very weak and needing a lot of guarding assist for transfers today.  24 hour assist recommended at current level.  Pt will need to use RW as well as obtain 3N1 at home as well.  Pt will benefit from skilled PT to increase their independence and safety with mobility to allow discharge to the venue listed below.      Follow  Up Recommendations Home health PT;Supervision/Assistance - 24 hour (If 24 hour care cannot be arranged intitially recommend NHP)    Equipment Recommendations  3in1 (PT)    Recommendations for Other Services       Precautions / Restrictions Precautions Precautions: Fall Restrictions Weight Bearing Restrictions: No      Mobility  Bed Mobility Overal bed mobility: Independent                Transfers Overall transfer level: Needs assistance Equipment used: 1 person hand held assist Transfers: Sit to/from Stand;Stand Pivot Transfers Sit to Stand: Min assist Stand pivot transfers: Min assist       General transfer comment: needs steadying assisst for balance with transfers.  Pt felt too weak to take a walk today.  Pt transferred to 3N1 and was total assist to clean once done.  Then transferred into recliner.   Ambulation/Gait                Stairs            Wheelchair Mobility    Modified Rankin (Stroke Patients Only)       Balance Overall balance assessment: Needs assistance;History of Falls Sitting-balance support: No upper extremity supported;Feet supported Sitting balance-Leahy Scale: Fair     Standing balance support: Bilateral upper extremity supported;During functional activity Standing balance-Leahy Scale: Poor Standing balance comment:  require use of UES for support                             Pertinent Vitals/Pain Pain Assessment: 0-10 Pain Score: 7  Pain Location: stomach Pain Descriptors / Indicators: Aching Pain Intervention(s): Limited activity within patient's tolerance;Monitored during session;Repositioned;Patient requesting pain meds-RN notified  VSS    Home Living Family/patient expects to be discharged to:: Private residence Living Arrangements: Children Available Help at Discharge: Family;Available PRN/intermittently Type of Home: House Home Access: Stairs to enter Entrance Stairs-Rails: Right Entrance  Stairs-Number of Steps: 5 Home Layout: One level Home Equipment: Walker - 2 wheels;Walker - 4 wheels Additional Comments: has home O2, was on 3L continuous    Prior Function Level of Independence: Independent with assistive device(s)         Comments: Had been using RW at home per pt due to feeling weak.       Hand Dominance   Dominant Hand: Right    Extremity/Trunk Assessment   Upper Extremity Assessment: Defer to OT evaluation           Lower Extremity Assessment: Generalized weakness      Cervical / Trunk Assessment: Normal  Communication   Communication: No difficulties  Cognition Arousal/Alertness: Awake/alert Behavior During Therapy: WFL for tasks assessed/performed Overall Cognitive Status: Within Functional Limits for tasks assessed                      General Comments      Exercises General Exercises - Lower Extremity Ankle Circles/Pumps: AROM;Both;10 reps;Seated Long Arc Quad: AROM;Both;10 reps;Seated      Assessment/Plan    PT Assessment Patient needs continued PT services  PT Diagnosis Generalized weakness   PT Problem List Decreased strength;Decreased activity tolerance;Decreased balance;Decreased mobility;Decreased knowledge of use of DME;Decreased safety awareness;Decreased knowledge of precautions  PT Treatment Interventions DME instruction;Gait training;Functional mobility training;Therapeutic activities;Therapeutic exercise;Balance training;Stair training;Patient/family education   PT Goals (Current goals can be found in the Care Plan section) Acute Rehab PT Goals Patient Stated Goal: to go home PT Goal Formulation: With patient Time For Goal Achievement: 09/13/14 Potential to Achieve Goals: Good    Frequency Min 3X/week   Barriers to discharge Decreased caregiver support      Co-evaluation               End of Session Equipment Utilized During Treatment: Gait belt;Oxygen Activity Tolerance: Patient limited by  fatigue Patient left: in chair;with call bell/phone within reach Nurse Communication: Mobility status;Patient requests pain meds         Time: 6301-6010 PT Time Calculation (min) (ACUTE ONLY): 21 min   Charges:   PT Evaluation $Initial PT Evaluation Tier I: 1 Procedure PT Treatments $Therapeutic Activity: 8-22 mins   PT G CodesDenice Paradise September 16, 2014, 2:24 PM Kandon Hosking,PT Acute Rehabilitation [phone] (pager)

## 2014-08-30 NOTE — Progress Notes (Signed)
Subjective:  Still w diarrhea, and abd pain, taking pain meds for abd pain. On clear liquids  Objective Vital signs in last 24 hours: Filed Vitals:   08/30/14 0715 08/30/14 0800 08/30/14 0830 08/30/14 0900  BP: 154/57 143/51 141/54 150/59  Pulse: 71 69 70 70  Temp:      TempSrc:      Resp: 16 16 20    Height:      Weight:      SpO2:       Weight change:   Physical Exam: General   Awoken from sleep Alert, no distress Chest clear bilat  Heart-RRR 2/6 early syst harsh M, no Rub or gallop Abd- slightly  tender mid SP and LLQ areas, +BS  Extremities-  No LE or UE edema  Dialysis Access:- R U A AVF pos. bruit  HD: [facility] 3.5h F160 65.5kg 2/2.25 [place] Heparin none   RUA AVF Aranesp 120/wk, Venofer 50 /wk, Calcitriol 0.25 ug TIW Hb 10.0, tsat 14%, ferr 580, Ca/P 9.4/5.4, 221pth  Problem/Plan: 1. Dyspnea / acute on chronic resp failure - resolved 2. HTN/ vol - wt's up again today, counseled pt re fluid intake. Max UF with HD today. No meds. Stop topical nitrates 3. Abd pain / N / V / diarrhea - hx recurrent Cdif; Cdif neg this admit, w/u in progress  4. 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 ESRD - on  MWF HD 6. COPD- on home O2 7. 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 Hx afib / RVR- now improved, on amio 9. Hx legionella PNA 10. 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 Anemia cont darbe/ Aurea Graff MD pager [phone]    cell [phone] 08/30/2014, 9:21 AM    Labs: Basic Metabolic Panel:  Recent Labs Lab 08/08/2014 1150 08/09/2014 1935 08/28/14 0042 08/30/14 0500  NA 134*  --  129* 135*  K 5.2  --  5.2 4.8  CL 90*  --  87* 96  CO2 30  --  24 25  GLUCOSE 75  --  60* 79  BUN 39*  --  42* 24*  CREATININE 5.42* 5.74* 5.76* 5.30*  CALCIUM 8.5  --  7.9* 8.2*   Liver Function Tests:  Recent Labs Lab 08/26/2014 1150  AST 16  ALT 11  ALKPHOS 133*  BILITOT 0.5  PROT 5.7*  ALBUMIN 2.2*    Recent Labs Lab 08/07/2014 1150  LIPASE 10*   No  results for input(s): AMMONIA in the last 168 hours. CBC:  Recent Labs Lab 08/12/2014 1150 07/31/2014 2025 08/28/14 0725 08/30/14 0500  WBC 12.0* 9.4 4.4 5.9  NEUTROABS 10.2*  --   --   --   HGB 10.4* 9.5* 9.7* 9.2*  HCT 36.0 33.4* 33.4* 32.6*  MCV 101.7* 99.1 97.9 99.7  PLT 211 186 187 158   Cardiac Enzymes:  Recent Labs Lab 08/01/2014 1935 08/28/14 0042 08/28/14 0620  TROPONINI <0.30 <0.30 <0.30   CBG:  Recent Labs Lab 08/28/14 2135 08/29/14 0801 08/29/14 1135 08/29/14 1707 08/29/14 2210  GLUCAP 87 76 135* 91 79    Studies/Results: No results found. Medications:   . amiodarone  200 mg Oral Daily  . antiseptic oral rinse  7 mL Mouth Rinse BID  . calcium acetate  1,334 mg Oral TID WC  . calcium acetate  667 mg Oral BID BM  . ciprofloxacin  400 mg Intravenous QHS  . ezetimibe  10 mg Oral QHS  . feeding supplement (  RESOURCE BREEZE)  1 Container Oral BID BM  . heparin  5,000 Units Subcutaneous 3 times per day  . insulin aspart  0-5 Units Subcutaneous QHS  . insulin aspart  0-9 Units Subcutaneous TID WC  . metronidazole  500 mg Intravenous Q8H  . nitroGLYCERIN  0.5 inch Topical 4 times per day  . rOPINIRole  1 mg Oral BID

## 2014-08-30 NOTE — Plan of Care (Signed)
Problem: Phase I Progression Outcomes Goal: OOB as tolerated unless otherwise ordered Outcome: Completed/Met Date Met:  08/30/14     

## 2014-08-30 NOTE — Progress Notes (Signed)
PROGRESS NOTE  [name] ERD:[id number] DOB: 12-14-1940 DOA: 08/07/2014 PCP: Gilford Rile, MD  Assessment/Plan: Abdominal pain, vomiting, and diarrhea- diverticulitis -C. difficile PCR negative -GI pathogen panel negative -advance diet PRN imodium -Her med rec states that she is on metoclopramide and Bentyl at home however she states that she is no longer taking these -cipro/flagyl - GI consult   CKD (chronic kidney disease) stage V requiring chronic dialysis- with fluid overload -Hd Monday   CHF (congestive heart failure)- acute on chronic systolic heart failure -Last echo (06/19/2014 ) reveals an EF of 40%- not on nitroglycerin/hydralazine combination - has chronic pleural effusions which are worse when compared to prior x-ray in September - manage with dialysis  Epigastric pain -Not certain if this is related to above-mentioned GI symptoms or if it is cardiac.  -EKG is unchanged from prior   ?UTI -culture pending -HD patient   Atrial fibrillation- paroxysmal -200 mg of amiodarone daily -She is not on anticoagulation   COPD GOLD II -Continue inhalers when necessary and 3 L of oxygen  Diabetes mellitus 2 -This is mainly diet controlled-she was told to take Glucotrol if her blood sugar is greater than 150 and she rarely has to take it- place her on a low-dose sliding scale   Protein calorie malnutrition   Code Status: full Family Communication: patient Disposition Plan: PT eval and d/c in AM?   Consultants:  Renal  GI   Procedures:      HPI/Subjective: Pain better but still there-- has been > 1 year  Objective: Filed Vitals:   08/30/14 1143  BP: 116/54  Pulse: 83  Temp: 97.5 F (36.4 C)  Resp: 16    Intake/Output Summary (Last 24 hours) at 08/30/14 1152 Last data filed at 08/30/14 1052  Gross per 24 hour  Intake    380 ml  Output   2500 ml  Net  -2120 ml   Filed Weights   08/28/14 1125 08/30/14 0705 08/30/14 1052  Weight:  63.3 kg (139 lb 8.8 oz) 66.1 kg (145 lb 11.6 oz) 62.7 kg (138 lb 3.7 oz)    Exam:   General:  Pleasant/cooperative, NAD  Cardiovascular: rrr  Respiratory: no wheezing  Abdomen: +Bs, soft  Musculoskeletal: no edema   Data Reviewed: Basic Metabolic Panel:  Recent Labs Lab 08/13/2014 1150 07/30/2014 1935 08/28/14 0042 08/30/14 0500  NA 134*  --  129* 135*  K 5.2  --  5.2 4.8  CL 90*  --  87* 96  CO2 30  --  24 25  GLUCOSE 75  --  60* 79  BUN 39*  --  42* 24*  CREATININE 5.42* 5.74* 5.76* 5.30*  CALCIUM 8.5  --  7.9* 8.2*   Liver Function Tests:  Recent Labs Lab 08/26/2014 1150  AST 16  ALT 11  ALKPHOS 133*  BILITOT 0.5  PROT 5.7*  ALBUMIN 2.2*    Recent Labs Lab 08/25/2014 1150  LIPASE 10*   No results for input(s): AMMONIA in the last 168 hours. CBC:  Recent Labs Lab 07/30/2014 1150 08/05/2014 2025 08/28/14 0725 08/30/14 0500  WBC 12.0* 9.4 4.4 5.9  NEUTROABS 10.2*  --   --   --   HGB 10.4* 9.5* 9.7* 9.2*  HCT 36.0 33.4* 33.4* 32.6*  MCV 101.7* 99.1 97.9 99.7  PLT 211 186 187 158   Cardiac Enzymes:  Recent Labs Lab 08/10/2014 1935 08/28/14 0042 08/28/14 0620  TROPONINI <0.30 <0.30 <0.30   BNP (last 3 results)  Recent  Labs  04/19/14 0410 06/06/14 1620  PROBNP 30133.0* >70000.0*   CBG:  Recent Labs Lab 08/28/14 2135 08/29/14 0801 08/29/14 1135 08/29/14 1707 08/29/14 2210  GLUCAP 87 76 135* 91 79    Recent Results (from the past 240 hour(s))  Clostridium Difficile by PCR     Status: None   Collection Time: 08/28/2014  2:14 PM  Result Value Ref Range Status   C difficile by pcr NEGATIVE NEGATIVE Final  MRSA PCR Screening     Status: None   Collection Time: 08/23/2014  6:57 PM  Result Value Ref Range Status   MRSA by PCR NEGATIVE NEGATIVE Final    Comment:        The GeneXpert MRSA Assay (FDA approved for NASAL specimens only), is one component of a comprehensive MRSA colonization surveillance program. It is not intended to  diagnose MRSA infection nor to guide or monitor treatment for MRSA infections.      Studies: No results found.  Scheduled Meds: . amiodarone  200 mg Oral Daily  . antiseptic oral rinse  7 mL Mouth Rinse BID  . calcitRIOL  0.25 mcg Oral Q M,W,F-HD  . calcium acetate  1,334 mg Oral TID WC  . calcium acetate  667 mg Oral BID BM  . ciprofloxacin  400 mg Intravenous QHS  . darbepoetin (ARANESP) injection - DIALYSIS  125 mcg Intravenous Q Wed-HD  . ezetimibe  10 mg Oral QHS  . feeding supplement (RESOURCE BREEZE)  1 Container Oral BID BM  . ferric gluconate (FERRLECIT/NULECIT) IV  62.5 mg Intravenous Q Wed-HD  . heparin  5,000 Units Subcutaneous 3 times per day  . insulin aspart  0-5 Units Subcutaneous QHS  . insulin aspart  0-9 Units Subcutaneous TID WC  . metronidazole  500 mg Intravenous Q8H  . rOPINIRole  1 mg Oral BID   Continuous Infusions:  Antibiotics Given (last 72 hours)    Date/Time Action Medication Dose Rate   08/28/14 1316 Given   ciprofloxacin (CIPRO) IVPB 400 mg 400 mg 200 mL/hr   08/28/14 1843 Given   metroNIDAZOLE (FLAGYL) IVPB 500 mg 500 mg 100 mL/hr   08/29/14 0154 Given   metroNIDAZOLE (FLAGYL) IVPB 500 mg 500 mg 100 mL/hr   08/29/14 0945 Given   metroNIDAZOLE (FLAGYL) IVPB 500 mg 500 mg 100 mL/hr   08/29/14 1805 Given   metroNIDAZOLE (FLAGYL) IVPB 500 mg 500 mg 100 mL/hr   08/29/14 2205 Given   ciprofloxacin (CIPRO) IVPB 400 mg 400 mg 200 mL/hr   08/30/14 0207 Given   metroNIDAZOLE (FLAGYL) IVPB 500 mg 500 mg 100 mL/hr      Principal Problem:   Abdominal pain, vomiting, and diarrhea Active Problems:   CKD (chronic kidney disease) stage V requiring chronic dialysis   HTN (hypertension)   Atrial fibrillation   COPD GOLD II   Acute systolic congestive heart failure   Nausea vomiting and diarrhea   Protein-calorie malnutrition, severe    Time spent: 25 min    Matteo Banke  [facility] Pager [phone]. If 7PM-7AM, please contact  night-coverage at [email or web address], password [facility] 08/30/2014, 11:52 AM  LOS: 3 days

## 2014-08-30 NOTE — Progress Notes (Signed)
[facility] Progress Note  Subjective: The patient is doing well today. She is seen in dialysis. She is not really complaining of any significant abdominal pain or diarrhea. She does tell me she has to have a bowel movement after she eats or drinks anything. We are treating her currently for diverticulitis.  Objective: Vital signs in last 24 hours: Temp:  [97 F (36.1 C)-98.6 F (37 C)] 97 F (36.1 C) (12/02 1052) Pulse Rate:  [64-89] 74 (12/02 1052) Resp:  [16-20] 17 (12/02 1052) BP: (109-154)/(41-59) 128/53 mmHg (12/02 1052) SpO2:  [93 %-98 %] 98 % (12/02 1052) Weight:  [62.7 kg (138 lb 3.7 oz)-66.1 kg (145 lb 11.6 oz)] 62.7 kg (138 lb 3.7 oz) (12/02 1052) Weight change:    PE:  She is in no distress  Abdomen soft nontender  Lab Results: Results for orders placed or performed during the hospital encounter of 08/25/2014 (from the past 24 hour(s))  Glucose, capillary     Status: Abnormal   Collection Time: 08/29/14 11:35 AM  Result Value Ref Range   Glucose-Capillary 135 (H) 70 - 99 mg/dL  Glucose, capillary     Status: None   Collection Time: 08/29/14  5:07 PM  Result Value Ref Range   Glucose-Capillary 91 70 - 99 mg/dL  Glucose, capillary     Status: None   Collection Time: 08/29/14 10:10 PM  Result Value Ref Range   Glucose-Capillary 79 70 - 99 mg/dL  CBC     Status: Abnormal   Collection Time: 08/30/14  5:00 AM  Result Value Ref Range   WBC 5.9 4.0 - 10.5 K/uL   RBC 3.27 (L) 3.87 - 5.11 MIL/uL   Hemoglobin 9.2 (L) 12.0 - 15.0 g/dL   HCT 32.6 (L) 36.0 - 46.0 %   MCV 99.7 78.0 - 100.0 fL   MCH 28.1 26.0 - 34.0 pg   MCHC 28.2 (L) 30.0 - 36.0 g/dL   RDW 15.9 (H) 11.5 - 15.5 %   Platelets 158 150 - 400 K/uL  Basic metabolic panel     Status: Abnormal   Collection Time: 08/30/14  5:00 AM  Result Value Ref Range   Sodium 135 (L) 137 - 147 mEq/L   Potassium 4.8 3.7 - 5.3 mEq/L   Chloride 96 96 - 112 mEq/L   CO2 25 19 - 32 mEq/L   Glucose, Bld 79 70 - 99  mg/dL   BUN 24 (H) 6 - 23 mg/dL   Creatinine, Ser 5.30 (H) 0.50 - 1.10 mg/dL   Calcium 8.2 (L) 8.4 - 10.5 mg/dL   GFR calc non Af Amer 7 (L) >90 mL/min   GFR calc Af Amer 8 (L) >90 mL/min   Anion gap 14 5 - 15    Studies/Results: No results found.    Assessment: Diverticulitis  Her complaint of postprandial bowel movements is probably secondary to a functional disorder rather than a significant GI disease. I would treat this symptomatically with Imodium.  Plan: Continue treatment for the diverticulitis. Symptomatic treatment for postprandial stooling. Nothing further to add. We will sign off. Call if needed.    Francess Mullen F 08/30/2014, 11:19 AM

## 2014-08-30 NOTE — Progress Notes (Signed)
NUTRITION FOLLOW UP  Pt meets criteria for SEVERE MALNUTRITION in the context of chronic illness as evidenced by a 12.9% weight loss in 5 months and energy intake </= 75% for >/= 1 month.  DOCUMENTATION CODES Per approved criteria  -Severe malnutrition in the context of chronic illness   INTERVENTION: Provide Nepro Shake po once daily, each supplement provides 425 kcal and 19 grams protein.  Provide Resource Breeze po once daily, each supplement provides 250 kcal and 9 grams of protein.  Encourage PO intake.  NUTRITION DIAGNOSIS: Inadequate oral intake related to decreased appetite, n/v, diarrhea as evidenced by pt report; improved  Goal: Pt to meet >/= 90% of their estimated nutrition needs; met  Monitor:  PO intake, weight trends, labs, I/O's  73 y.o. 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 Patient comes in to the hospital with a complaint of abdominal pain nausea vomiting and diarrhea.  Meal completion is 50-100%. Pt reports she has still been having abdominal pains and diarrhea after eating her meals. Pt has been drink her [employer] and likes them. Pt has just been advanced to a full liquid diet. RD to order Nepro Shake. Noted pt with fluid overload.   Labs: Low sodium, calcium, and GFR. High BUN and creatinine.  Height: Ht Readings from Last 1 Encounters:  08/25/2014 5' 4" (1.626 m)    Weight: Wt Readings from Last 1 Encounters:  08/30/14 138 lb 3.7 oz (62.7 kg)    BMI:  Body mass index is 23.72 kg/(m^2).  Re-Estimated Nutritional Needs: Kcal: 1900-2100 Protein: 85-95 grams Fluid: Per MD  Skin:  intact  Diet Order: Diet full liquid   Intake/Output Summary (Last 24 hours) at 08/30/14 1450 Last data filed at 08/30/14 1052  Gross per 24 hour  Intake    260 ml  Output   2500 ml  Net  -2240 ml    Last BM: 11/30  Labs:   Recent Labs Lab 08/06/2014 1150 07/31/2014 1935 08/28/14 0042 08/30/14 0500  NA 134*  --  129* 135*  K 5.2  --  5.2 4.8  CL 90*  --  87* 96  CO2 30  --  24 25  BUN 39*  --  42* 24*  CREATININE 5.42* 5.74* 5.76* 5.30*  CALCIUM 8.5  --  7.9* 8.2*  GLUCOSE 75  --  60* 79    CBG (last 3)   Recent Labs  08/29/14 2210 08/30/14 1140 08/30/14 1225  GLUCAP 79 67* 81    Scheduled Meds: . amiodarone  200 mg Oral Daily  . antiseptic oral rinse  7 mL Mouth Rinse BID  . calcitRIOL  0.25 mcg Oral Q M,W,F-HD  . calcium acetate  1,334 mg Oral TID WC  . calcium acetate  667 mg Oral BID BM  . ciprofloxacin  500 mg Oral QHS  . darbepoetin (ARANESP) injection - DIALYSIS  125 mcg Intravenous Q Wed-HD  . ezetimibe  10 mg Oral QHS  . feeding supplement (RESOURCE BREEZE)  1 Container Oral BID BM  . ferric gluconate (FERRLECIT/NULECIT) IV  62.5 mg Intravenous Q Wed-HD  . heparin  5,000 Units Subcutaneous 3 times per day  . insulin aspart  0-5 Units Subcutaneous QHS  . insulin aspart  0-9 Units Subcutaneous TID WC  . metroNIDAZOLE  500 mg Oral 3 times per day  . rOPINIRole  1 mg Oral BID    Continuous Infusions:   Past Medical History  Diagnosis Date  . Carotid artery occlusion     bilat CEA in 2012  . C. 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 Hypertension   . Atrial fibrillation April  2013  . Pulmonary hypertension   . Secondary hyperparathyroidism, renal   . COPD (chronic obstructive pulmonary disease)   . Hypercholesterolemia   . History of MI (myocardial infarction)     "they say I had 2 years ago" (08/20/2012)  . GERD (gastroesophageal reflux disease)   . History of pneumonia     "have it alot" (08/20/2012)  . Sleep apnea      "don't wear mask anymore"  . Type II diabetes mellitus   . History of blood transfusion 2013    "3 times so far in 2013:  4 pints one time; 2 pints another; ?# last time" (08/20/2012)  . Iron deficiency anemia   . Seizures 2012    after carotid surgery  . ESRD on dialysis     "[place]; Tues, Thurs; Sat"  . On home oxygen therapy     "24/7"   . Acute diverticulitis     Oct 2013, treated medically [facility], sigmoid diverticulitits  . CHF (congestive heart failure)   . Seizure disorder March 2011  . Myocardial infarction   . Renal cell carcinoma 2005?  Marland Kitchen Cancer of kidney 2005  . Renal insufficiency   . Coronary artery disease     remote MI with PCI    Past Surgical History  Procedure Laterality Date  . Nephrectomy  2005    Right, for Renal cell carcinoma  . Carotid endarterectomy  2012    bilaterally; Dr. Kellie Simmering  . Av fistula placement  Jan. 7, 2011    Right  upper arm by Dr. Kellie Simmering  . Hd catheter  Aug. 22, 2013    [facility]  . Appendectomy      childhood  . Abdominal hysterectomy  1971?  Marland Kitchen Coronary angioplasty with stent placement  1990's    "1 total"  . Coronary angioplasty  1990's  . Av fistula repair  2013    right upper arm  . Esophagogastroduodenoscopy N/A 12/29/2012    Procedure: ESOPHAGOGASTRODUODENOSCOPY (EGD);  Surgeon: Jeryl Columbia, MD;  Location: [facility] ENDOSCOPY;  Service: Endoscopy;  Laterality: N/A;  . Colonoscopy with propofol N/A 12/31/2012    Procedure: COLONOSCOPY WITH PROPOFOL;  Surgeon: Jeryl Columbia, MD;  Location: [facility] ENDOSCOPY;  Service: Endoscopy;  Laterality: N/A;  currently IP at [place], MS, RD, LDN Pager # [phone] After hours/ weekend pager # [phone]

## 2014-08-30 NOTE — Plan of Care (Signed)
Problem: Phase III Progression Outcomes Goal: Pain controlled on oral analgesia Outcome: Completed/Met Date Met:  08/30/14     

## 2014-08-31 LAB — GLUCOSE, CAPILLARY
GLUCOSE-CAPILLARY: 101 mg/dL — AB (ref 70–99)
GLUCOSE-CAPILLARY: 107 mg/dL — AB (ref 70–99)
Glucose-Capillary: 78 mg/dL (ref 70–99)
Glucose-Capillary: 95 mg/dL (ref 70–99)

## 2014-08-31 MED ORDER — RENA-VITE PO TABS
1.0000 | ORAL_TABLET | Freq: Every day | ORAL | Status: DC
  Administered 2014-09-01 – 2014-09-03 (×2): 1 via ORAL
  Filled 2014-08-31 (×4): qty 1

## 2014-08-31 NOTE — Plan of Care (Signed)
Problem: Phase I Progression Outcomes Goal: Initial discharge plan identified Outcome: Completed/Met Date Met:  08/31/14 Goal: Voiding-avoid urinary catheter unless indicated Outcome: Completed/Met Date Met:  08/31/14 Goal: Hemodynamically stable Outcome: Completed/Met Date Met:  08/31/14     

## 2014-08-31 NOTE — Clinical Social Work Placement (Addendum)
Clinical Social Work Department CLINICAL SOCIAL WORK PLACEMENT NOTE 08/31/2014  Patient:  [name], [name]  Account Number:  [id number] Admit date:  08/26/2014  Clinical Social Worker:  Aune Adami Givens, LCSW  Date/time:  08/31/2014 04:44 AM  Clinical Social Work is seeking post-discharge placement for this patient at the following level of care:   SKILLED NURSING   (*CSW will update this form in Epic as items are completed)   08/31/2014  Patient/family provided with [facility] Department of Clinical Social Work's list of facilities offering this level of care within the geographic area requested by the patient (or if unable, by the patient's family).  08/31/2014  Patient/family informed of their freedom to choose among providers that offer the needed level of care, that participate in Medicare, Medicaid or managed care program needed by the patient, have an available bed and are willing to accept the patient.    Patient/family informed of [facility]' ownership interest in [facility], as well as of the fact that they are under no obligation to receive care at this facility.  PASARR submitted to EDS on 01/03/2010 PASARR number received on 01/03/2010  FL2 transmitted to all facilities in geographic area requested by pt/family on  08/31/2014 FL2 transmitted to all facilities within larger geographic area on   Patient informed that his/her managed care company has contracts with or will negotiate with  certain facilities, including the following:     Patient/family informed of bed offers received: 09/01/14  Patient chooses bed at  Physician recommends and patient chooses bed at    Patient to be transferred to  on   Patient to be transferred to facility by  Patient and family notified of transfer on  Name of family member notified:    The following physician request were entered in Epic:   Additional Comments:      Burma Ketcher Givens, MSW, [place] [phone]

## 2014-08-31 NOTE — Progress Notes (Signed)
PROGRESS NOTE  [name] PYP:[id number] DOB: Feb 16, 1941 DOA: 08/26/2014 PCP: Gilford Rile, MD  Assessment/Plan: Abdominal pain, vomiting, and diarrhea- diverticulitis -C. difficile PCR negative -GI pathogen panel negative -advance diet PRN imodium -Her med rec states that she is on metoclopramide and Bentyl at home however she states that she is no longer taking these -cipro/flagyl - GI consult -does not appear to be ischemic colitis   CKD (chronic kidney disease) stage V requiring chronic dialysis- with fluid overload -Hd Monday/W/F   CHF (congestive heart failure)- acute on chronic systolic heart failure -Last echo (06/19/2014 ) reveals an EF of 40%- not on nitroglycerin/hydralazine combination - has chronic pleural effusions which are worse when compared to prior x-ray in September - manage with dialysis  Epigastric pain -Not certain if this is related to above-mentioned GI symptoms or if it is cardiac.  -EKG is unchanged from prior   ?UTI -culture pending -HD patient   Atrial fibrillation- paroxysmal -200 mg of amiodarone daily -She is not on anticoagulation   COPD GOLD II -Continue inhalers when necessary and 3 L of oxygen  Diabetes mellitus 2 -This is mainly diet controlled-she was told to take Glucotrol if her blood sugar is greater than 150 and she rarely has to take it- place her on a low-dose sliding scale   Protein calorie malnutrition   Code Status: full Family Communication: patient Disposition Plan: PT eval - SNF vs 24 hour care- unable to reach family on phone   Consultants:  Renal  GI   Procedures:      HPI/Subjective: Pain better but still there-- has been > 1 year Was able to tolerate coffee this AM  Objective: Filed Vitals:   08/31/14 0505  BP: 131/52  Pulse: 69  Temp: 98.2 F (36.8 C)  Resp: 18    Intake/Output Summary (Last 24 hours) at 08/31/14 1138 Last data filed at 08/30/14 1700  Gross per 24 hour  Intake     180 ml  Output      0 ml  Net    180 ml   Filed Weights   08/30/14 0705 08/30/14 1052 08/30/14 2011  Weight: 66.1 kg (145 lb 11.6 oz) 62.7 kg (138 lb 3.7 oz) 63.957 kg (141 lb)    Exam:   General:  Pleasant/cooperative, NAD  Cardiovascular: rrr  Respiratory: no wheezing  Abdomen: +Bs, soft  Musculoskeletal: no edema   Data Reviewed: Basic Metabolic Panel:  Recent Labs Lab 08/08/2014 1150 08/15/2014 1935 08/28/14 0042 08/30/14 0500  NA 134*  --  129* 135*  K 5.2  --  5.2 4.8  CL 90*  --  87* 96  CO2 30  --  24 25  GLUCOSE 75  --  60* 79  BUN 39*  --  42* 24*  CREATININE 5.42* 5.74* 5.76* 5.30*  CALCIUM 8.5  --  7.9* 8.2*   Liver Function Tests:  Recent Labs Lab 08/19/2014 1150  AST 16  ALT 11  ALKPHOS 133*  BILITOT 0.5  PROT 5.7*  ALBUMIN 2.2*    Recent Labs Lab 08/09/2014 1150  LIPASE 10*   No results for input(s): AMMONIA in the last 168 hours. CBC:  Recent Labs Lab 08/17/2014 1150 07/31/2014 2025 08/28/14 0725 08/30/14 0500  WBC 12.0* 9.4 4.4 5.9  NEUTROABS 10.2*  --   --   --   HGB 10.4* 9.5* 9.7* 9.2*  HCT 36.0 33.4* 33.4* 32.6*  MCV 101.7* 99.1 97.9 99.7  PLT 211 186 187 158  Cardiac Enzymes:  Recent Labs Lab 08/20/2014 1935 08/28/14 0042 08/28/14 0620  TROPONINI <0.30 <0.30 <0.30   BNP (last 3 results)  Recent Labs  04/19/14 0410 06/06/14 1620  PROBNP 30133.0* >70000.0*   CBG:  Recent Labs Lab 08/30/14 1140 08/30/14 1225 08/30/14 1657 08/30/14 2010 08/31/14 0748  GLUCAP 67* 81 108* 115* 78    Recent Results (from the past 240 hour(s))  Clostridium Difficile by PCR     Status: None   Collection Time: 08/25/2014  2:14 PM  Result Value Ref Range Status   C difficile by pcr NEGATIVE NEGATIVE Final  MRSA PCR Screening     Status: None   Collection Time: 08/05/2014  6:57 PM  Result Value Ref Range Status   MRSA by PCR NEGATIVE NEGATIVE Final    Comment:        The GeneXpert MRSA Assay (FDA approved for NASAL  specimens only), is one component of a comprehensive MRSA colonization surveillance program. It is not intended to diagnose MRSA infection nor to guide or monitor treatment for MRSA infections.      Studies: No results found.  Scheduled Meds: . amiodarone  200 mg Oral Daily  . antiseptic oral rinse  7 mL Mouth Rinse BID  . calcitRIOL  0.25 mcg Oral Q M,W,F-HD  . calcium acetate  1,334 mg Oral TID WC  . ciprofloxacin  500 mg Oral QHS  . darbepoetin (ARANESP) injection - DIALYSIS  125 mcg Intravenous Q Wed-HD  . ezetimibe  10 mg Oral QHS  . feeding supplement (NEPRO CARB STEADY)  237 mL Oral Daily  . feeding supplement (RESOURCE BREEZE)  1 Container Oral Q1500  . ferric gluconate (FERRLECIT/NULECIT) IV  62.5 mg Intravenous Q Wed-HD  . heparin  5,000 Units Subcutaneous 3 times per day  . insulin aspart  0-5 Units Subcutaneous QHS  . insulin aspart  0-9 Units Subcutaneous TID WC  . metroNIDAZOLE  500 mg Oral 3 times per day  . multivitamin  1 tablet Oral QHS  . rOPINIRole  1 mg Oral BID   Continuous Infusions:  Antibiotics Given (last 72 hours)    Date/Time Action Medication Dose Rate   08/28/14 1316 Given   ciprofloxacin (CIPRO) IVPB 400 mg 400 mg 200 mL/hr   08/28/14 1843 Given   metroNIDAZOLE (FLAGYL) IVPB 500 mg 500 mg 100 mL/hr   08/29/14 0154 Given   metroNIDAZOLE (FLAGYL) IVPB 500 mg 500 mg 100 mL/hr   08/29/14 0945 Given   metroNIDAZOLE (FLAGYL) IVPB 500 mg 500 mg 100 mL/hr   08/29/14 1805 Given   metroNIDAZOLE (FLAGYL) IVPB 500 mg 500 mg 100 mL/hr   08/29/14 2205 Given   ciprofloxacin (CIPRO) IVPB 400 mg 400 mg 200 mL/hr   08/30/14 0207 Given   metroNIDAZOLE (FLAGYL) IVPB 500 mg 500 mg 100 mL/hr   08/30/14 1424 Given   metroNIDAZOLE (FLAGYL) tablet 500 mg 500 mg    08/30/14 2319 Given   metroNIDAZOLE (FLAGYL) tablet 500 mg 500 mg    08/30/14 2320 Given   ciprofloxacin (CIPRO) tablet 500 mg 500 mg    08/31/14 6812 Given   metroNIDAZOLE (FLAGYL) tablet 500  mg 500 mg       Principal Problem:   Abdominal pain, vomiting, and diarrhea Active Problems:   CKD (chronic kidney disease) stage V requiring chronic dialysis   HTN (hypertension)   Atrial fibrillation   COPD GOLD II   Acute systolic congestive heart failure   Nausea vomiting and diarrhea  Protein-calorie malnutrition, severe    Time spent: 25 min    [facility], Zak Gondek  [facility] Pager [phone]. If 7PM-7AM, please contact night-coverage at [email or web address], password [facility] 08/31/2014, 11:38 AM  LOS: 4 days

## 2014-08-31 NOTE — Progress Notes (Signed)
Subjective:  Co  Some stomach discomfort when eating but 1 kg gain yest  After hd ,no am wt yet/ tolerated hd yest.  Objective Vital signs in last 24 hours: Filed Vitals:   08/30/14 1143 08/30/14 1700 08/30/14 2011 08/31/14 0505  BP: 116/54 118/55 133/52 131/52  Pulse: 83 72 75 69  Temp: 97.5 F (36.4 C) 99.1 F (37.3 C) 98 F (36.7 C) 98.2 F (36.8 C)  TempSrc: Oral Oral    Resp: 16 17 18 18   Height:      Weight:   63.957 kg (141 lb)   SpO2: 94% 96% 92% 92%   Weight change:  Physical Exam: General- Alert, no distress Chest -CTA bilat Heart-RRR 2/6 early syst harsh M, no Rub or gallop Abd- slightly tender mid SP and LLQ areas, +BS/ ND Extremities- No LE or UE edema Dialysis Access:- R U A AVF pos. bruit  HD: [facility] 3.5h F160 65.5kg 2/2.25 [place] Heparin none RUA AVF Aranesp 120/wk, Venofer 50 /wk, Calcitriol 0.25 ug TIW Hb 10.0, tsat 14%, ferr 580, Ca/P 9.4/5.4, 221pth  Problem/Plan:  1. Dyspnea / acute on chronic resp failure -resolved with HD 2. Abd pain / nausea - Cdif neg GI signed off.  3. 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 ESRD - on MWF HD 5. COPD- on home O2 6. 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 Hx afib / RVR- now improved vr  8. Anemia-hgb 9.2 on esa/fe 9. MBD- phoslo and vit d 10. Hx legionella PNA 11. Deconditioning - NH per PT/ Pt thinks she has help with 2 daughters    Ernest Haber, PA-C [place] [phone] 08/31/2014,8:09 AM  LOS: 4 days   Pt seen, examined, agree w assess/plan as above with additions as indicated.  Kelly Splinter MD pager [phone]    cell [phone] 08/31/2014, 12:31 PM     Labs: Basic Metabolic Panel:  Recent Labs Lab 08/02/2014 1150 08/13/2014 1935 08/28/14 0042 08/30/14 0500  NA 134*  --  129* 135*  K 5.2  --  5.2 4.8  CL 90*  --  87* 96  CO2 30  --  24 25  GLUCOSE 75  --  60* 79  BUN 39*  --  42* 24*   CREATININE 5.42* 5.74* 5.76* 5.30*  CALCIUM 8.5  --  7.9* 8.2*   Liver Function Tests:  Recent Labs Lab 08/21/2014 1150  AST 16  ALT 11  ALKPHOS 133*  BILITOT 0.5  PROT 5.7*  ALBUMIN 2.2*    Recent Labs Lab 08/28/2014 1150  LIPASE 10*  CBC:  Recent Labs Lab 08/15/2014 1150 08/16/2014 2025 08/28/14 0725 08/30/14 0500  WBC 12.0* 9.4 4.4 5.9  NEUTROABS 10.2*  --   --   --   HGB 10.4* 9.5* 9.7* 9.2*  HCT 36.0 33.4* 33.4* 32.6*  MCV 101.7* 99.1 97.9 99.7  PLT 211 186 187 158   Cardiac Enzymes:  Recent Labs Lab 08/20/2014 1935 08/28/14 0042 08/28/14 0620  TROPONINI <0.30 <0.30 <0.30   CBG:  Recent Labs Lab 08/29/14 2210 08/30/14 1140 08/30/14 1225 08/30/14 1657 08/30/14 2010  GLUCAP 79 67* 81 108* 115*    Studies/Results: No results found. Medications:   . amiodarone  200 mg Oral Daily  . antiseptic oral rinse  7 mL Mouth Rinse BID  . calcitRIOL  0.25 mcg Oral Q M,W,F-HD  . calcium acetate  1,334 mg Oral TID WC  . calcium  acetate  667 mg Oral BID BM  . ciprofloxacin  500 mg Oral QHS  . darbepoetin (ARANESP) injection - DIALYSIS  125 mcg Intravenous Q Wed-HD  . ezetimibe  10 mg Oral QHS  . feeding supplement (NEPRO CARB STEADY)  237 mL Oral Daily  . feeding supplement (RESOURCE BREEZE)  1 Container Oral Q1500  . ferric gluconate (FERRLECIT/NULECIT) IV  62.5 mg Intravenous Q Wed-HD  . heparin  5,000 Units Subcutaneous 3 times per day  . insulin aspart  0-5 Units Subcutaneous QHS  . insulin aspart  0-9 Units Subcutaneous TID WC  . metroNIDAZOLE  500 mg Oral 3 times per day  . rOPINIRole  1 mg Oral BID

## 2014-08-31 NOTE — Progress Notes (Signed)
Physical Therapy Treatment Patient Details Name: [name] MRN: [id number] DOB: 11-07-1940 Today's Date: 08/31/2014    History of Present Illness The patient is a 73 year old female with multiple medical problems as described below. We are asked to see her in regards to abdominal pain and diarrhea. She does have a past history of Clostridium difficile which she was treated for and her most recent Clostridium difficile toxin by PCR was negative a day or 2 ago. She states that she's been having diarrhea for quite some time. She basically states that every time she eats she has to have a bowel movement. Also she gets up at night and has to have a bowel movement. She does not see blood in the stool. She has a history of diverticulosis and also a history of a colovaginal fistula. She saw Dr. Marcello Moores from surgery in regards to this in the past but due to her multiple medical problems it was felt too risky to operate on her, and the patient states her cardiologist did not want her to go through surgery. In the past she used to notice stool, both of her rectum and vagina but it it is no longer having out of her vagina, and she states that it was felt that the fistula sealed. Last week she started to experience lower abdominal pain. A CT scan of the abdomen and pelvis yesterday shows evidence of diverticulitis in the sigmoid colon.      PT Comments    Pt progressing slowly towards physical therapy goals. Pt states that she overall feels "tired and weak", and states that she continues to vomit anytime she eats. Pt was put on 3L/min supplemental O2 during session, and left on that level per RN. Pt continues to be limited by decreased tolerance for functional activity, and demonstrates balance deficits without use of the RW. Will continue to follow.   Follow Up Recommendations  Home health PT;Supervision/Assistance - 24 hour (If 24 hour care cannot be arranged intitially recommend NHP)     Equipment  Recommendations  3in1 (PT)    Recommendations for Other Services       Precautions / Restrictions Precautions Precautions: Fall Restrictions Weight Bearing Restrictions: No    Mobility  Bed Mobility Overal bed mobility: Independent                Transfers Overall transfer level: Needs assistance Equipment used: Rolling walker (2 wheeled) Transfers: Sit to/from Stand Sit to Stand: Min assist         General transfer comment: On initial stand pt achieves full standing and then immediately drops back down to the bed. On second attempt pt is able to stabilize onto walker and maintain static standing.   Ambulation/Gait Ambulation/Gait assistance: Min guard Ambulation Distance (Feet): 50 Feet (25'x2) Assistive device: Rolling walker (2 wheeled) Gait Pattern/deviations: Step-through pattern;Decreased stride length;Trunk flexed Gait velocity: Decreased Gait velocity interpretation: Below normal speed for age/gender General Gait Details: Pt able to ambulate in room x2 bouts. Very fatigued after initial 25'. O2 was on 1.5L/min and sats were at 85%. Pt states she is usually on 3L/min supplemental O2 at home and level was increased. On second attempt sats remained >90%.    Stairs            Wheelchair Mobility    Modified Rankin (Stroke Patients Only)       Balance Overall balance assessment: Needs assistance Sitting-balance support: No upper extremity supported;Feet supported Sitting balance-Leahy Scale: Fair  Standing balance support: Bilateral upper extremity supported;During functional activity Standing balance-Leahy Scale: Poor                      Cognition Arousal/Alertness: Awake/alert Behavior During Therapy: WFL for tasks assessed/performed Overall Cognitive Status: Within Functional Limits for tasks assessed                      Exercises      General Comments General comments (skin integrity, edema, etc.): Pt was very  fatigued from gait training and further exercise/mobility was deferred.       Pertinent Vitals/Pain Pain Assessment: Faces Faces Pain Scale: Hurts even more Pain Location: Stomach Pain Descriptors / Indicators: Aching Pain Intervention(s): Limited activity within patient's tolerance;Monitored during session    Home Living                      Prior Function            PT Goals (current goals can now be found in the care plan section) Acute Rehab PT Goals Patient Stated Goal: to go home PT Goal Formulation: With patient Time For Goal Achievement: 09/13/14 Potential to Achieve Goals: Good Progress towards PT goals: Progressing toward goals    Frequency  Min 3X/week    PT Plan Current plan remains appropriate    Co-evaluation             End of Session Equipment Utilized During Treatment: Gait belt;Oxygen Activity Tolerance: Patient limited by fatigue Patient left: in chair;with call bell/phone within reach     Time: 1547-1611 PT Time Calculation (min) (ACUTE ONLY): 24 min  Charges:  $Gait Training: 8-22 mins $Therapeutic Activity: 8-22 mins                    G Codes:      Rolinda Roan September 25, 2014, 4:54 PM   Rolinda Roan, PT, DPT Acute Rehabilitation Services Pager: [phone]

## 2014-08-31 NOTE — Clinical Social Work Psychosocial (Signed)
Clinical Social Work Department BRIEF PSYCHOSOCIAL ASSESSMENT 08/31/2014  Patient:  [name], [name]     Account Number:  [id number]     Admit date:  08/03/2014  Clinical Social Worker:  Frederico Hamman  Date/Time:  08/31/2014 04:03 AM  Referred by:  Physician  Date Referred:  08/31/2014 Referred for  SNF Placement   Other Referral:   Interview type:  Patient Other interview type:   CSW also spoke with patient's daughter, [name] ([phone]).    PSYCHOSOCIAL DATA Living Status:  FAMILY Admitted from facility:   Level of care:   Primary support name:  [name] Primary support relationship to patient:  CHILD, ADULT Degree of support available:   Patient lives with daughter [name] and her husband and spends her days with her daughter [name].    CURRENT CONCERNS Current Concerns  Post-Acute Placement   Other Concerns:    SOCIAL WORK ASSESSMENT / PLAN CSW initially spoke with patient who is alert and oriented regarding discharge planning. Patient willing to consider going to rehab but advised CSW that she will not go to [facility] due to an unpleasant past stay at that facility.  Patient also requested that CSW contact her daughter [name].    Call made to Mrs. [name] and recommendation for short-term rehab discussed. Mrs. [name] in agreement and added that she will talk with her siblings about it also. Their facility preference is [facility] in [place].   Assessment/plan status:  Psychosocial Support/Ongoing Assessment of Needs Other assessment/ plan:   Information/referral to community resources:   None needed or requested at this time.    PATIENT'S/FAMILY'S RESPONSE TO PLAN OF CARE: Patient and daughter receptive to talking with CSW about ST rehab and in agreement with the post-discharge plan.     [name], MSW, [place] [phone]

## 2014-08-31 NOTE — Care Management Note (Signed)
CARE MANAGEMENT NOTE 08/31/2014  Patient:  [name],[name]   Account Number:  [id number]  Date Initiated:  08/31/2014  Documentation initiated by:  Journey Castonguay  Subjective/Objective Assessment:   CM following for progression and d/c planning.     Action/Plan:   Met with pt who most like will decide on short term SNF for rehab.   Anticipated DC Date:  09/01/2014   Anticipated DC Plan:  SKILLED NURSING FACILITY         Choice offered to / List presented to:             Status of service:  Completed, signed off Medicare Important Message given?  YES (If response is "NO", the following Medicare IM given date fields will be blank) Date Medicare IM given:  08/31/2014 Medicare IM given by:  Shavontae Gibeault Date Additional Medicare IM given:   Additional Medicare IM given by:    Discharge Disposition:  [place]  Per UR Regulation:    If discussed at Long Length of Stay Meetings, dates discussed:    Comments:

## 2014-09-01 ENCOUNTER — Encounter: Payer: Self-pay | Admitting: Internal Medicine

## 2014-09-01 DIAGNOSIS — R1084 Generalized abdominal pain: Secondary | ICD-10-CM | POA: Insufficient documentation

## 2014-09-01 DIAGNOSIS — I509 Heart failure, unspecified: Secondary | ICD-10-CM

## 2014-09-01 LAB — RENAL FUNCTION PANEL
ANION GAP: 13 (ref 5–15)
Albumin: 2.1 g/dL — ABNORMAL LOW (ref 3.5–5.2)
BUN: 18 mg/dL (ref 6–23)
CHLORIDE: 98 meq/L (ref 96–112)
CO2: 26 mEq/L (ref 19–32)
CREATININE: 5.04 mg/dL — AB (ref 0.50–1.10)
Calcium: 8.7 mg/dL (ref 8.4–10.5)
GFR calc Af Amer: 9 mL/min — ABNORMAL LOW (ref >90)
GFR calc non Af Amer: 8 mL/min — ABNORMAL LOW (ref >90)
GLUCOSE: 81 mg/dL (ref 70–99)
Phosphorus: 3.8 mg/dL (ref 2.3–4.6)
Potassium: 5.3 mEq/L (ref 3.7–5.3)
Sodium: 137 mEq/L (ref 137–147)

## 2014-09-01 LAB — GLUCOSE, CAPILLARY
GLUCOSE-CAPILLARY: 80 mg/dL (ref 70–99)
Glucose-Capillary: 74 mg/dL (ref 70–99)
Glucose-Capillary: 92 mg/dL (ref 70–99)

## 2014-09-01 LAB — CBC
HEMATOCRIT: 38.1 % (ref 36.0–46.0)
HEMOGLOBIN: 10.3 g/dL — AB (ref 12.0–15.0)
MCH: 27.8 pg (ref 26.0–34.0)
MCHC: 27 g/dL — ABNORMAL LOW (ref 30.0–36.0)
MCV: 102.7 fL — AB (ref 78.0–100.0)
Platelets: 180 10*3/uL (ref 150–400)
RBC: 3.71 MIL/uL — ABNORMAL LOW (ref 3.87–5.11)
RDW: 15.7 % — AB (ref 11.5–15.5)
WBC: 8.4 10*3/uL (ref 4.0–10.5)

## 2014-09-01 MED ORDER — OXYCODONE HCL 5 MG PO TABS
ORAL_TABLET | ORAL | Status: AC
  Filled 2014-09-01: qty 1

## 2014-09-01 MED ORDER — METOCLOPRAMIDE HCL 5 MG/ML IJ SOLN
INTRAMUSCULAR | Status: AC
  Filled 2014-09-01: qty 2

## 2014-09-01 MED ORDER — OXYCODONE HCL 5 MG PO TABS
5.0000 mg | ORAL_TABLET | Freq: Once | ORAL | Status: AC
  Administered 2014-09-01: 5 mg via ORAL

## 2014-09-01 MED ORDER — METOCLOPRAMIDE HCL 5 MG/ML IJ SOLN
5.0000 mg | Freq: Three times a day (TID) | INTRAMUSCULAR | Status: DC
  Administered 2014-09-01 – 2014-09-03 (×7): 5 mg via INTRAVENOUS
  Filled 2014-09-01 (×6): qty 1

## 2014-09-01 NOTE — Consult Note (Signed)
[facility] Reason for Consult: Chronic abdominal pain Referring Physician: Dr. Eulogio Bear  [name] is an 73 y.o. female.  HPI: [name] is a 73 y.o. female with a past medical history of C. difficile colitis, paroxysmal atrial fibrillation not on anticoagulation, end-stage renal disease on hemodialysis, chronic respiratory failure on 3 L of oxygen, diet-controlled diabetes mellitus, hypertension and OSA (does not wear C Pap).  Admitted to the hospital on 08/11/2014 with complaints of abdominal pain, nausea, vomiting, and diarrhea. She has been having these symptoms on and off for about 2 weeks now but reports nowthey have become worse. Historically, she reports issues with chronic postprandial diarrhea for > 69mo. She also endorses that her abdominal pain most often occurs with meal intake. 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 Does not endorse troublesome vaginal discharge.  Was to be seen in the office for outpatient surgical consultation, however, [place] was strongly recommended that she not undergo surgical intervention given her medical co-morbidities. Therefore, she did not complete outpatient surgical follow up. Currently, she states that she is having diarrhea despite the fact that she is barely eating. She sometimes has to have BMs in the middle of the night. She has omitted dairy from her diet without any notable improvement. She's not noticed any blood in her stool or her vomitus. She notes epigastric pain from time to time and takes an aspirin and eventually the pain fades away. She has not had any fevers or chills. CT A/P on 08/03/2014 indicated Slightly increased inflammation/stranding along the distal sigmoid colon with mild adjacent wall thickening -question acute diverticulitis. A few tiny foci of extraluminal gas extends to the vaginal cuff  could represent a fistula. We have been asked to see this patient to determine if surgical intervention is a therapeutic option or necessity with regard to above.    Past Medical History  Diagnosis Date  . Carotid artery occlusion     bilat CEA in 2012  . C. 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 Hypertension   . Atrial fibrillation April  2013  . Pulmonary hypertension   . Secondary hyperparathyroidism, renal   . COPD (chronic obstructive pulmonary disease)   . Hypercholesterolemia   . History of MI (myocardial infarction)     "they say I had 2 years ago" (08/20/2012)  . GERD (gastroesophageal reflux disease)   . History of pneumonia     "have it alot" (08/20/2012)  . Sleep apnea     "don't wear mask anymore"  . Type II diabetes mellitus   . History of blood transfusion 2013    "3 times so far in 2013:  4 pints one time; 2 pints another; ?# last time" (08/20/2012)  . Iron deficiency anemia   . Seizures 2012    after carotid surgery  . ESRD on dialysis     "Bartholomew; Tues, Thurs; Sat"  . On home oxygen therapy     "24/7"   . Acute diverticulitis     [facility]  . CHF (congestive heart failure)   . Seizure disorder March 2011  . Myocardial infarction   . Renal cell carcinoma 2005?  .Marland KitchenCancer of kidney 2005  . Renal insufficiency   . Coronary artery disease     remote MI with PCI  Past Surgical History  Procedure Laterality Date  . Nephrectomy  2005    Right, for Renal cell carcinoma  . Carotid endarterectomy  2012    bilaterally; Dr. Kellie Simmering  . Av fistula placement  Jan. 7, 2011    Right  upper arm by Dr. Kellie Simmering  . Hd catheter  Aug. 22, 2013    [facility]  . Appendectomy      childhood  . Abdominal hysterectomy  1971?  Marland Kitchen Coronary angioplasty with stent placement  1990's    "1 total"  . Coronary angioplasty  1990's  . Av fistula repair  2013    right upper arm  .  Esophagogastroduodenoscopy N/A 12/29/2012    Procedure: ESOPHAGOGASTRODUODENOSCOPY (EGD);  Surgeon: Jeryl Columbia, MD;  [facility] END[facility];  Service: Endoscopy;  Laterality: N/A;  . Colonoscopy with propofol N/A 12/31/2012    Procedure: COLONOSCOPY WITH PROPOFOL;  Surgeon: Jeryl Columbia, MD;  Location: [facility] ENDOSCOPY;  Service: Endoscopy;  Laterality: N/A;  currently IP at [place] History  Problem Relation Age of Onset  . Cancer Mother     pancreatic  . Diabetes Mother   . Stroke Father   . Coronary artery disease Father   . Heart disease Father 70    Heart Disease before age 72  . Hyperlipidemia Father   . Hypertension Father   . Heart attack Father   . Cancer Brother     Brain  . Kidney disease Brother     Kidney stones  . Emphysema Father     smoked  . Pancreatic cancer Mother     Social History:  reports that she quit smoking about 10 years ago. Her smoking use included Cigarettes. She has a 67.5 pack-year smoking history. She has never used smokeless tobacco. She reports that she does not drink alcohol or use illicit drugs.  Allergies:  Allergies  Allergen Reactions  . Lipitor [Atorvastatin] Other (See Comments)    Causes weakness and drowsiness  . Morphine And Related Nausea And Vomiting  . Nsaids Other (See Comments)    Unknown reported by previous hospital    Medications: I have reviewed the patient's current medications.  Results for orders placed or performed during the hospital encounter of 07/30/2014 (from the past 48 hour(s))  Glucose, capillary     Status: None   Collection Time: 08/30/14 12:25 PM  Result Value Ref Range   Glucose-Capillary 81 70 - 99 mg/dL  Glucose, capillary     Status: Abnormal   Collection Time: 08/30/14  4:57 PM  Result Value Ref Range   Glucose-Capillary 108 (H) 70 - 99 mg/dL  Glucose, capillary     Status: Abnormal   Collection Time: 08/30/14  8:10 PM  Result Value Ref Range   Glucose-Capillary 115 (H) 70 - 99 mg/dL   Glucose, capillary     Status: None   Collection Time: 08/31/14  7:48 AM  Result Value Ref Range   Glucose-Capillary 78 70 - 99 mg/dL  Glucose, capillary     Status: Abnormal   Collection Time: 08/31/14 12:06 PM  Result Value Ref Range   Glucose-Capillary 101 (H) 70 - 99 mg/dL   Comment 1 Notify RN   Glucose, capillary     Status: None   Collection Time: 08/31/14  5:30 PM  Result Value Ref Range   Glucose-Capillary 95 70 - 99 mg/dL   Comment 1 Notify RN   Glucose, capillary     Status: Abnormal  Collection Time: 08/31/14  9:34 PM  Result Value Ref Range   Glucose-Capillary 107 (H) 70 - 99 mg/dL  Glucose, capillary     Status: None   Collection Time: 09/01/14  8:23 AM  Result Value Ref Range   Glucose-Capillary 74 70 - 99 mg/dL   Comment 1 Notify RN    Comment 2 Call MD NNP PA CNM    Comment 3 Repeat Test   Renal function panel     Status: Abnormal   Collection Time: 09/01/14 10:09 AM  Result Value Ref Range   Sodium 137 137 - 147 mEq/L   Potassium 5.3 3.7 - 5.3 mEq/L   Chloride 98 96 - 112 mEq/L   CO2 26 19 - 32 mEq/L   Glucose, Bld 81 70 - 99 mg/dL   BUN 18 6 - 23 mg/dL   Creatinine, Ser 5.04 (H) 0.50 - 1.10 mg/dL   Calcium 8.7 8.4 - 10.5 mg/dL   Phosphorus 3.8 2.3 - 4.6 mg/dL   Albumin 2.1 (L) 3.5 - 5.2 g/dL   GFR calc non Af Amer 8 (L) >90 mL/min   GFR calc Af Amer 9 (L) >90 mL/min    Comment: (NOTE) The eGFR has been calculated using the CKD EPI equation. This calculation has not been validated in all clinical situations. eGFR's persistently <90 mL/min signify possible Chronic Kidney Disease.    Anion gap 13 5 - 15  CBC     Status: Abnormal   Collection Time: 09/01/14 10:09 AM  Result Value Ref Range   WBC 8.4 4.0 - 10.5 K/uL   RBC 3.71 (L) 3.87 - 5.11 MIL/uL   Hemoglobin 10.3 (L) 12.0 - 15.0 g/dL   HCT 38.1 36.0 - 46.0 %   MCV 102.7 (H) 78.0 - 100.0 fL   MCH 27.8 26.0 - 34.0 pg   MCHC 27.0 (L) 30.0 - 36.0 g/dL   RDW 15.7 (H) 11.5 - 15.5 %   Platelets  180 150 - 400 K/uL    No results found.  Review of Systems  Constitutional: Positive for weight loss. Negative for fever and chills.  Respiratory: Negative.   Cardiovascular: Negative.   Gastrointestinal: Positive for nausea, vomiting, abdominal pain and diarrhea. Negative for constipation, blood in stool and melena.  Genitourinary:       Makes very little urine. +ESRD on HD 3 x week.   Skin: Negative.    Blood pressure 151/55, pulse 70, temperature 98.3 F (36.8 C), temperature source Oral, resp. rate 16, height _0  (1.626 m), weight 64.547 kg (142 lb 4.8 oz), SpO2 100 %. Physical Exam  Nursing note and vitals reviewed. Constitutional: She is oriented to person, place, and time. She appears well-developed and well-nourished. She is cooperative.  Evaluated patient while receiving HD Appears elderly and frail  HENT:  Head: Normocephalic and atraumatic.  Eyes: Conjunctivae are normal. No scleral icterus.  Cardiovascular: Normal rate, regular rhythm and normal heart sounds.   Respiratory: Effort normal and breath sounds normal.  GI: Soft. Bowel sounds are normal. She exhibits no distension. There is no tenderness. There is no rebound and no guarding.  Patient is pain free during exam. Describes that when pain occurs, it is felt in suprapubic area of her abdomen, but has no reproducible pain during exam   Musculoskeletal: Normal range of motion.  Neurological: She is alert and oriented to person, place, and time.  Skin: Skin is warm and dry.  Psychiatric: She has a normal mood and affect. Her  behavior is normal.    Assessment/Plan: 1. Dyspnea / acute on chronic resp failure -resolved with HD 2. Abd pain / nausea - Cdif neg, on po cipro /flagyl for sig diverticulitis 3. 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 ESRD - on MWF HD 5. HTN/vol - 1 kg under prev dry wt 6. COPD- on home O2 7. 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 Hx afib / RVR- now improved vr   9. Anemia-hgb 9's on esa/fe 10. MBD- phoslo and vit d 11. Hx legionella PNA 12. Deconditioning - NH per PT/ Pt thinks she has help with 2 daughters  While patient may have mild recurrence of her sub-acute sigmoid diverticulitis, she seems to be responding favorably to antibiotic therapy. At the moment she is without pain or finding to suggest worsening acute abdominal process indicating the need for surgical intervention. If pain intensifies, may wish to consider a period of bowel rest. She remains afebrile and is without leukocytosis. Given her unfortunate list of medical co-morbidities, surgery currently poses significant morbidity/mortality risk  that outweighs benefit. We would be glad to follow during hospitalization in case her condition changes.  Thank you for consultation.  Lahoma Rocker, [facility] Pager [phone]    Griselda Miner LEE 09/01/2014, 12:08 PM

## 2014-09-01 NOTE — Procedures (Signed)
I was present at this dialysis session, have reviewed the session itself and made  appropriate changes  Kelly Splinter MD (pgr) [phone]    ([phone] 09/01/2014, 9:38 AM

## 2014-09-01 NOTE — Progress Notes (Addendum)
PROGRESS NOTE  [name] OXB:[id number] DOB: 1940-12-29 DOA: 08/10/2014 PCP: Gilford Rile, MD  [name] is a 73 y.o. 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  Patient comes in to the hospital with a complaint of abdominal pain nausea vomiting and diarrhea. She has been having these symptoms on and off for about 2 weeks now but they have become much worse. She states that she is having diarrhea despite the fact that she is barely eating. She sometimes has to have BMs in the middle of the night. She has omitted dairy from her diet without any notable improvement. She's not noticed any blood in her stool or her vomitus. She noted epigastric pain yesterday and took an aspirin and eventually the pain faded away. It recurred today and she again took an aspirin and currently is no longer in pain. She has not had any fevers or chills. She is also found to have pulmonary edema on exam and does complain of shortness of breath for the past day. She is not requiring more than her usual 3 L of oxygen at this time. contniue to have abd pain.  Surgical consult and then prob palliative care consult   Assessment/Plan: Abdominal pain, vomiting, and diarrhea---> diverticulitis -C. difficile PCR negative -GI pathogen panel negative -advance diet PRN imodium -Her med rec states that she is on metoclopramide and Bentyl at home however she states that she is no longer taking these -cipro/flagyl - GI consult -does not appear to be ischemic colitis' -spoke with Dr. Jonnie Finner who is concerned about fistula- will get surgical consult- if patient not a candidate for repair may need palliative care consult- she has been told by  her heart doctor that with her heart conditions she could not do surgery -added reglan   CKD (chronic kidney disease) stage V requiring chronic dialysis- with fluid overload -Hd Monday/W/F   CHF (congestive heart failure)- acute on chronic systolic heart failure -Last echo (06/19/2014 ) reveals an EF of 40%- not on nitroglycerin/hydralazine combination - has chronic pleural effusions which are worse when compared to prior x-ray in September - manage with dialysis  ?UTI -culture pending -HD patient   Atrial fibrillation- paroxysmal -200 mg of amiodarone daily -She is not on anticoagulation   COPD GOLD II -Continue inhalers when necessary and 3 L of oxygen  Diabetes mellitus 2 -This is mainly diet controlled-she was told to take Glucotrol if her blood sugar is greater than 150 and she rarely has to take it- place her on a low-dose sliding scale  Protein calorie malnutrition   Code Status: full Family Communication: patient Disposition Plan: PT eval - SNF vs 24 hour care- unable to reach family on phone   Consultants:  Renal  GI   Procedures:      HPI/Subjective: Still with nausea and vomiting- vomited up dinner and breakfast  Objective: Filed Vitals:   09/01/14 0503  BP: 151/55  Pulse: 70  Temp: 98.3 F (36.8 C)  Resp: 16    Intake/Output Summary (Last 24 hours) at 09/01/14 1029 Last data filed at 08/31/14 1700  Gross per 24 hour  Intake    120 ml  Output      0 ml  Net    120  ml   Filed Weights   08/30/14 1052 08/30/14 2011 08/31/14 2135  Weight: 62.7 kg (138 lb 3.7 oz) 63.957 kg (141 lb) 64.547 kg (142 lb 4.8 oz)    Exam:   General:  Pleasant/cooperative, NAD  Cardiovascular: rrr  Respiratory: no wheezing  Abdomen: +Bs, soft  Musculoskeletal: no edema   Data Reviewed: Basic Metabolic Panel:  Recent Labs Lab 08/17/2014 1150 08/23/2014 1935 08/28/14 0042 08/30/14 0500  NA 134*  --  129* 135*  K 5.2  --  5.2 4.8  CL 90*  --   87* 96  CO2 30  --  24 25  GLUCOSE 75  --  60* 79  BUN 39*  --  42* 24*  CREATININE 5.42* 5.74* 5.76* 5.30*  CALCIUM 8.5  --  7.9* 8.2*   Liver Function Tests:  Recent Labs Lab 08/12/2014 1150  AST 16  ALT 11  ALKPHOS 133*  BILITOT 0.5  PROT 5.7*  ALBUMIN 2.2*    Recent Labs Lab 08/19/2014 1150  LIPASE 10*   No results for input(s): AMMONIA in the last 168 hours. CBC:  Recent Labs Lab 08/11/2014 1150 08/08/2014 2025 08/28/14 0725 08/30/14 0500  WBC 12.0* 9.4 4.4 5.9  NEUTROABS 10.2*  --   --   --   HGB 10.4* 9.5* 9.7* 9.2*  HCT 36.0 33.4* 33.4* 32.6*  MCV 101.7* 99.1 97.9 99.7  PLT 211 186 187 158   Cardiac Enzymes:  Recent Labs Lab 08/06/2014 1935 08/28/14 0042 08/28/14 0620  TROPONINI <0.30 <0.30 <0.30   BNP (last 3 results)  Recent Labs  04/19/14 0410 06/06/14 1620  PROBNP 30133.0* >70000.0*   CBG:  Recent Labs Lab 08/31/14 0748 08/31/14 1206 08/31/14 1730 08/31/14 2134 09/01/14 0823  GLUCAP 78 101* 95 107* 74    Recent Results (from the past 240 hour(s))  Clostridium Difficile by PCR     Status: None   Collection Time: 08/14/2014  2:14 PM  Result Value Ref Range Status   C difficile by pcr NEGATIVE NEGATIVE Final  MRSA PCR Screening     Status: None   Collection Time: 08/20/2014  6:57 PM  Result Value Ref Range Status   MRSA by PCR NEGATIVE NEGATIVE Final    Comment:        The GeneXpert MRSA Assay (FDA approved for NASAL specimens only), is one component of a comprehensive MRSA colonization surveillance program. It is not intended to diagnose MRSA infection nor to guide or monitor treatment for MRSA infections.      Studies: No results found.  Scheduled Meds: . amiodarone  200 mg Oral Daily  . antiseptic oral rinse  7 mL Mouth Rinse BID  . calcitRIOL  0.25 mcg Oral Q M,W,F-HD  . calcium acetate  1,334 mg Oral TID WC  . ciprofloxacin  500 mg Oral QHS  . darbepoetin (ARANESP) injection - DIALYSIS  125 mcg Intravenous Q Wed-HD    . ezetimibe  10 mg Oral QHS  . feeding supplement (NEPRO CARB STEADY)  237 mL Oral Daily  . feeding supplement (RESOURCE BREEZE)  1 Container Oral Q1500  . ferric gluconate (FERRLECIT/NULECIT) IV  62.5 mg Intravenous Q Wed-HD  . heparin  5,000 Units Subcutaneous 3 times per day  . insulin aspart  0-5 Units Subcutaneous QHS  . insulin aspart  0-9 Units Subcutaneous TID WC  . metroNIDAZOLE  500 mg Oral 3 times per day  . multivitamin  1 tablet Oral QHS  . rOPINIRole  1 mg  Oral BID   Continuous Infusions:  Antibiotics Given (last 72 hours)    Date/Time Action Medication Dose Rate   08/29/14 1805 Given   metroNIDAZOLE (FLAGYL) IVPB 500 mg 500 mg 100 mL/hr   08/29/14 2205 Given   ciprofloxacin (CIPRO) IVPB 400 mg 400 mg 200 mL/hr   08/30/14 0207 Given   metroNIDAZOLE (FLAGYL) IVPB 500 mg 500 mg 100 mL/hr   08/30/14 1424 Given   metroNIDAZOLE (FLAGYL) tablet 500 mg 500 mg    08/30/14 2319 Given   metroNIDAZOLE (FLAGYL) tablet 500 mg 500 mg    08/30/14 2320 Given   ciprofloxacin (CIPRO) tablet 500 mg 500 mg    08/31/14 0459 Given   metroNIDAZOLE (FLAGYL) tablet 500 mg 500 mg    08/31/14 1348 Given   metroNIDAZOLE (FLAGYL) tablet 500 mg 500 mg       Principal Problem:   Abdominal pain, vomiting, and diarrhea Active Problems:   CKD (chronic kidney disease) stage V requiring chronic dialysis   HTN (hypertension)   Atrial fibrillation   COPD GOLD II   Acute systolic congestive heart failure   Nausea vomiting and diarrhea   Protein-calorie malnutrition, severe    Time spent: 25 min    VANN, [facility] Pager [phone]. If 7PM-7AM, please contact night-coverage at [email or web address], password [facility] 09/01/2014, 10:29 AM  LOS: 5 days

## 2014-09-01 NOTE — Progress Notes (Signed)
Subjective:  Threw up breakfast this am, gets nauseous "at the sight of food"  Objective Vital signs in last 24 hours: Filed Vitals:   08/31/14 0505 08/31/14 1805 08/31/14 2135 09/01/14 0503  BP: 131/52 101/63 134/51 151/55  Pulse: 69 63 68 70  Temp: 98.2 F (36.8 C) 98.1 F (36.7 C) 98.3 F (36.8 C) 98.3 F (36.8 C)  TempSrc:  Oral Oral Oral  Resp: 18 19 18 16   Height:      Weight:   64.547 kg (142 lb 4.8 oz)   SpO2: 92% 95% 99% 100%   Weight change: -1.553 kg (-3 lb 6.8 oz) Physical Exam: General- Alert, no distress Chest -CTA bilat Heart-RRR 2/6 early syst harsh M, no Rub or gallop Abd- nontender, +BS/ ND Extremities- No LE or UE edema Dialysis Access:- R U A AVF pos. bruit  HD: MWF [place] 3.5h F160 65.5kg 2/2.25 [place] Heparin none RUA AVF Aranesp 120/wk, Venofer 50 /wk, Calcitriol 0.25 ug TIW Hb 10.0, tsat 14%, ferr 580, Ca/P 9.4/5.4, 221pth  Assessment:  1. Dyspnea / acute on chronic resp failure -resolved with HD 2. Abd pain / nausea - Cdif neg, on po cipro /flagyl for sig diverticulitis 3. 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 ESRD - on MWF HD 5. HTN/vol - 1 kg under prev dry wt 6. COPD- on home O2 7. 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 Hx afib / RVR- now improved vr  9. Anemia-hgb 9's on esa/fe 10. MBD- phoslo and vit d 11. Hx legionella PNA 12. Deconditioning - NH per PT/ Pt thinks she has help with 2 daughters   Plan - HD today.  Abd pain / nausea/ vomiting persisting. She may have recurrence of infection sigmoid colon area w or w/o recurrent fistula problems as on last admit.  Not sure if she would be surgical candidate for this problem.  She is doing poorly now, losing weight, and has multiple medical problems and a palliative care consult may be helpful to assist pt in navigating EOL issues. I have d/w Burtis Junes briefly about this but will defer official consult to primary team.    Suzanne Splinter MD (pgr) [phone]     ([phone] 09/01/2014, 9:56 AM         Labs: Basic Metabolic Panel:  Recent Labs Lab 07/30/2014 1150 08/28/2014 1935 08/28/14 0042 08/30/14 0500  NA 134*  --  129* 135*  K 5.2  --  5.2 4.8  CL 90*  --  87* 96  CO2 30  --  24 25  GLUCOSE 75  --  60* 79  BUN 39*  --  42* 24*  CREATININE 5.42* 5.74* 5.76* 5.30*  CALCIUM 8.5  --  7.9* 8.2*   Liver Function Tests:  Recent Labs Lab 08/07/2014 1150  AST 16  ALT 11  ALKPHOS 133*  BILITOT 0.5  PROT 5.7*  ALBUMIN 2.2*    Recent Labs Lab 08/11/2014 1150  LIPASE 10*  CBC:  Recent Labs Lab 08/16/2014 1150 08/14/2014 2025 08/28/14 0725 08/30/14 0500  WBC 12.0* 9.4 4.4 5.9  NEUTROABS 10.2*  --   --   --   HGB 10.4* 9.5* 9.7* 9.2*  HCT 36.0 33.4* 33.4* 32.6*  MCV 101.7* 99.1 97.9 99.7  PLT 211 186 187 158   Cardiac Enzymes:  Recent Labs Lab 08/13/2014 1935 08/28/14 0042 08/28/14 0620  TROPONINI <0.30 <0.30 <0.30   CBG:  Recent Labs Lab 08/31/14 0748 08/31/14 1206 08/31/14 1730  08/31/14 2134 09/01/14 0823  GLUCAP 78 101* 95 107* 74    Studies/Results: No results found. Medications:   . amiodarone  200 mg Oral Daily  . antiseptic oral rinse  7 mL Mouth Rinse BID  . calcitRIOL  0.25 mcg Oral Q M,W,F-HD  . calcium acetate  1,334 mg Oral TID WC  . ciprofloxacin  500 mg Oral QHS  . darbepoetin (ARANESP) injection - DIALYSIS  125 mcg Intravenous Q Wed-HD  . ezetimibe  10 mg Oral QHS  . feeding supplement (NEPRO CARB STEADY)  237 mL Oral Daily  . feeding supplement (RESOURCE BREEZE)  1 Container Oral Q1500  . ferric gluconate (FERRLECIT/NULECIT) IV  62.5 mg Intravenous Q Wed-HD  . heparin  5,000 Units Subcutaneous 3 times per day  . insulin aspart  0-5 Units Subcutaneous QHS  . insulin aspart  0-9 Units Subcutaneous TID WC  . metroNIDAZOLE  500 mg Oral 3 times per day  . multivitamin  1 tablet Oral QHS  . rOPINIRole  1 mg Oral BID

## 2014-09-01 NOTE — Plan of Care (Signed)
Problem: Phase II Progression Outcomes Goal: IV changed to normal saline lock Outcome: Completed/Met Date Met:  09/01/14 Goal: Obtain order to discontinue catheter if appropriate Outcome: Not Applicable Date Met:  12/39/35  Problem: Phase III Progression Outcomes Goal: Voiding independently Outcome: Completed/Met Date Met:  09/01/14 Goal: Foley discontinued Outcome: Not Applicable Date Met:  94/09/05

## 2014-09-02 ENCOUNTER — Inpatient Hospital Stay (HOSPITAL_COMMUNITY): Payer: Medicare Other

## 2014-09-02 DIAGNOSIS — Z515 Encounter for palliative care: Secondary | ICD-10-CM

## 2014-09-02 DIAGNOSIS — I480 Paroxysmal atrial fibrillation: Secondary | ICD-10-CM

## 2014-09-02 DIAGNOSIS — I5023 Acute on chronic systolic (congestive) heart failure: Secondary | ICD-10-CM

## 2014-09-02 DIAGNOSIS — I10 Essential (primary) hypertension: Secondary | ICD-10-CM

## 2014-09-02 LAB — CBC
HEMATOCRIT: 41.1 % (ref 36.0–46.0)
HEMOGLOBIN: 11.5 g/dL — AB (ref 12.0–15.0)
MCH: 28.6 pg (ref 26.0–34.0)
MCHC: 28 g/dL — ABNORMAL LOW (ref 30.0–36.0)
MCV: 102.2 fL — AB (ref 78.0–100.0)
Platelets: 189 10*3/uL (ref 150–400)
RBC: 4.02 MIL/uL (ref 3.87–5.11)
RDW: 15.7 % — ABNORMAL HIGH (ref 11.5–15.5)
WBC: 13.1 10*3/uL — AB (ref 4.0–10.5)

## 2014-09-02 LAB — GLUCOSE, CAPILLARY
GLUCOSE-CAPILLARY: 125 mg/dL — AB (ref 70–99)
Glucose-Capillary: 82 mg/dL (ref 70–99)
Glucose-Capillary: 130 mg/dL — ABNORMAL HIGH (ref 70–99)
Glucose-Capillary: 109 mg/dL — ABNORMAL HIGH (ref 70–99)

## 2014-09-02 MED ORDER — GI COCKTAIL ~~LOC~~
30.0000 mL | Freq: Once | ORAL | Status: AC
  Administered 2014-09-02: 30 mL via ORAL
  Filled 2014-09-02: qty 30

## 2014-09-02 MED ORDER — HYDROMORPHONE HCL 1 MG/ML IJ SOLN
0.5000 mg | Freq: Once | INTRAMUSCULAR | Status: AC
  Administered 2014-09-02: 0.5 mg via INTRAVENOUS

## 2014-09-02 MED ORDER — HYDROMORPHONE HCL 1 MG/ML IJ SOLN
INTRAMUSCULAR | Status: AC
  Filled 2014-09-02: qty 1

## 2014-09-02 MED ORDER — METOCLOPRAMIDE HCL 5 MG/ML IJ SOLN
INTRAMUSCULAR | Status: AC
  Filled 2014-09-02: qty 2

## 2014-09-02 MED ORDER — PANTOPRAZOLE SODIUM 40 MG IV SOLR
40.0000 mg | Freq: Two times a day (BID) | INTRAVENOUS | Status: DC
  Administered 2014-09-03: 40 mg via INTRAVENOUS
  Filled 2014-09-02 (×3): qty 40

## 2014-09-02 MED ORDER — HYDROMORPHONE HCL 1 MG/ML IJ SOLN
0.2500 mg | INTRAMUSCULAR | Status: DC | PRN
  Administered 2014-09-02 – 2014-09-03 (×3): 0.25 mg via INTRAVENOUS
  Filled 2014-09-02 (×4): qty 1

## 2014-09-02 MED ORDER — DIAZEPAM 5 MG/ML IJ SOLN
2.5000 mg | Freq: Four times a day (QID) | INTRAMUSCULAR | Status: DC | PRN
  Administered 2014-09-03 (×2): 2.5 mg via INTRAVENOUS
  Filled 2014-09-02 (×2): qty 2

## 2014-09-02 MED ORDER — FAMOTIDINE IN NACL 20-0.9 MG/50ML-% IV SOLN
20.0000 mg | Freq: Two times a day (BID) | INTRAVENOUS | Status: AC
  Administered 2014-09-02 – 2014-09-03 (×2): 20 mg via INTRAVENOUS
  Filled 2014-09-02 (×3): qty 50

## 2014-09-02 MED ORDER — FENTANYL CITRATE 0.05 MG/ML IJ SOLN: 12.5000 ug | Freq: Once | INTRAMUSCULAR | Status: DC

## 2014-09-02 MED ORDER — FAMOTIDINE IN NACL 20-0.9 MG/50ML-% IV SOLN: 20.0000 mg | Freq: Two times a day (BID) | INTRAVENOUS | Status: DC

## 2014-09-02 MED ORDER — GI COCKTAIL ~~LOC~~
30.0000 mL | Freq: Three times a day (TID) | ORAL | Status: DC | PRN
  Filled 2014-09-02 (×2): qty 30

## 2014-09-02 MED ORDER — DICYCLOMINE HCL 20 MG PO TABS
20.0000 mg | ORAL_TABLET | Freq: Four times a day (QID) | ORAL | Status: DC | PRN
  Administered 2014-09-02: 20 mg via ORAL
  Filled 2014-09-02 (×4): qty 1

## 2014-09-02 NOTE — Clinical Social Work Note (Signed)
CSW continues to follow patient for d/c planning needs. CSW contacted patient's daughter Fulton Reek ([phone]) and left a message for follow-up. CSW to follow tomorrow.   [place], [place] [facility] Clinical Social Worker [phone]

## 2014-09-02 NOTE — Consult Note (Signed)
Palliative Medicine Team at [facility]  Date: 09/02/2014   Patient Name: [name]  DOB: [birth year]  MRN: [id number]  Age / Sex: 73 y.o., female   PCP: Raina Mina, MD Referring Physician: Cristal Ford, DO  Active Problems: Principal Problem:   Abdominal pain, vomiting, and diarrhea Active Problems:   CKD (chronic kidney disease) stage V requiring chronic dialysis   HTN (hypertension)   Atrial fibrillation   COPD GOLD II   Acute systolic congestive heart failure   Nausea vomiting and diarrhea   Protein-calorie malnutrition, severe   Congestive heart disease   Generalized abdominal pain   HPI/Reason for Consultation: [name] is a 73 y.o. 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 She also has chronic respiratory failure and OSA. She was admitted for worsening abdominal pain as well as NVD. She is on IV antibiotics but her WBC count is increasing as well as her pain- her nausea responded well to scheduled reglan. PMT consulted for goals of care and Symptom management.  Advance Directive: none   Code Status Orders        Start     Ordered   08/08/2014 1829  Full code   Continuous     08/02/2014 1829       I have reviewed the medical record, interviewed the patient and family, and examined the patient. The following aspects are pertinent.  Past Medical History  Diagnosis Date  . Carotid artery occlusion     bilat CEA in 2012  . C. 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 Hypertension   . Atrial fibrillation April  2013  . Pulmonary hypertension   . Secondary hyperparathyroidism, renal   . COPD (chronic obstructive pulmonary disease)   . Hypercholesterolemia   . History of MI (myocardial infarction)     "they say I had 2 years ago" (08/20/2012)  . GERD (gastroesophageal reflux disease)    . History of pneumonia     "have it alot" (08/20/2012)  . Sleep apnea     "don't wear mask anymore"  . Type II diabetes mellitus   . History of blood transfusion 2013    "3 times so far in 2013:  4 pints one time; 2 pints another; ?# last time" (08/20/2012)  . Iron deficiency anemia   . Seizures 2012    after carotid surgery  . ESRD on dialysis     "[facility]; Tues, Thurs; Sat"  . On home oxygen therapy     "24/7"   . Acute diverticulitis     Oct 2013, treated medically [facility], sigmoid diverticulitits  . CHF (congestive heart failure)   . Seizure disorder March 2011  . Myocardial infarction   . Renal cell carcinoma 2005?  Marland Kitchen Cancer of kidney 2005  . Renal insufficiency   . Coronary artery disease     remote MI with PCI   History   Social History  . Marital Status: Widowed    Spouse Name: N/A    Number of Children: N/A  . Years of Education: N/A   Occupational History  . Retired    Social History Main Topics  . Smoking status: Former Smoker -- 1.50 packs/day for 45 years    Types: Cigarettes    Quit date: 09/30/2003  . Smokeless tobacco: Never Used  . Alcohol Use: No  .  Drug Use: No  . Sexual Activity: No   Other Topics Concern  . None   Social History Narrative   Family History  Problem Relation Age of Onset  . Cancer Mother     pancreatic  . Diabetes Mother   . Stroke Father   . Coronary artery disease Father   . Heart disease Father 16    Heart Disease before age 44  . Hyperlipidemia Father   . Hypertension Father   . Heart attack Father   . Cancer Brother     Brain  . Kidney disease Brother     Kidney stones  . Emphysema Father     smoked  . Pancreatic cancer Mother    Scheduled Meds: . amiodarone  200 mg Oral Daily  . antiseptic oral rinse  7 mL Mouth Rinse BID  . calcitRIOL  0.25 mcg Oral Q M,W,F-HD  . calcium acetate  1,334 mg Oral TID WC  . ciprofloxacin  500 mg Oral QHS  . darbepoetin (ARANESP) injection - DIALYSIS  125  mcg Intravenous Q Wed-HD  . ezetimibe  10 mg Oral QHS  . famotidine (PEPCID) IV  20 mg Intravenous Q12H  . feeding supplement (NEPRO CARB STEADY)  237 mL Oral Daily  . feeding supplement (RESOURCE BREEZE)  1 Container Oral Q1500  . fentaNYL  12.5 mcg Intravenous Once  . ferric gluconate (FERRLECIT/NULECIT) IV  62.5 mg Intravenous Q Wed-HD  . gi cocktail  30 mL Oral Once  . heparin  5,000 Units Subcutaneous 3 times per day  . HYDROmorphone      . insulin aspart  0-5 Units Subcutaneous QHS  . insulin aspart  0-9 Units Subcutaneous TID WC  . metoCLOPramide (REGLAN) injection  5 mg Intravenous 3 times per day  . metroNIDAZOLE  500 mg Oral 3 times per day  . multivitamin  1 tablet Oral QHS  . pantoprazole (PROTONIX) IV  40 mg Intravenous Q12H  . rOPINIRole  1 mg Oral BID   Continuous Infusions:  PRN Meds:.acetaminophen **OR** acetaminophen, aspirin, diazepam, dicyclomine, gi cocktail, HYDROmorphone (DILAUDID) injection, ipratropium-albuterol, loperamide, [DISCONTINUED] ondansetron **OR** ondansetron (ZOFRAN) IV, ondansetron Allergies  Allergen Reactions  . Lipitor [Atorvastatin] Other (See Comments)    Causes weakness and drowsiness  . Morphine And Related Nausea And Vomiting  . Nsaids Other (See Comments)    Unknown reported by previous hospital   CBC:    Component Value Date/Time   WBC 13.1* 09/02/2014 1315   HGB 11.5* 09/02/2014 1315   HCT 41.1 09/02/2014 1315   PLT 189 09/02/2014 1315   MCV 102.2* 09/02/2014 1315   NEUTROABS 10.2* 08/04/2014 1150   LYMPHSABS 1.0 08/20/2014 1150   MONOABS 0.7 08/14/2014 1150   EOSABS 0.1 08/18/2014 1150   BASOSABS 0.0 08/05/2014 1150   Comprehensive Metabolic Panel:    Component Value Date/Time   NA 137 09/01/2014 1009   K 5.3 09/01/2014 1009   CL 98 09/01/2014 1009   CO2 26 09/01/2014 1009   BUN 18 09/01/2014 1009   CREATININE 5.04* 09/01/2014 1009   GLUCOSE 81 09/01/2014 1009   CALCIUM 8.7 09/01/2014 1009   AST 16 08/03/2014 1150     ALT 11 08/23/2014 1150   ALKPHOS 133* 08/03/2014 1150   BILITOT 0.5 08/21/2014 1150   PROT 5.7* 08/01/2014 1150   ALBUMIN 2.1* 09/01/2014 1009    Vital Signs: BP 123/51 mmHg  Pulse 75  Temp(Src) 98.4 F (36.9 C) (Oral)  Resp 18  Ht 5\' 4"  (1.626  m)  Wt 62.324 kg (137 lb 6.4 oz)  BMI 23.57 kg/m2  SpO2 96% Filed Weights   08/30/14 2011 08/31/14 2135 09/01/14 2145  Weight: 63.957 kg (141 lb) 64.547 kg (142 lb 4.8 oz) 62.324 kg (137 lb 6.4 oz)   12/04 0701 - 12/05 0700 In: 120 [P.O.:120] Out: -   Physical Exam:  Pale, in significant distress, holding her abdomen. Very tender to palpation in her epigastric region.   Assessment:  This appears to be acute on chronic abdominal pain- and quite severe. Difficult to tell what her baseline pain is but she reports that this is much different- the location of the pain is also epigastric- not in teh lower quadrants as expected with diverticulitis. She has multiple serious end stage medical problems and 6 admissions in the last 6 months so PMT consult is very appropriate.  Recommendations: 1. Address her Acute Abdomen and underlying etiology of her pain-check KUB, notified primary team  2. IV dilaudid low dose prn for pain  3. GI cocktail PRN  4. Bentyl for abdominal spasms PRN  5. Started IV PPI and covered with IV famotidine for 2 doses  6. Low dose diapzepam for anxiety and sleep.  I have scheduled a meeting at 12 noon with her daughters to further discuss goals of care.  Time In: 4:45 Time Out: 5:35 Time Total: 50 minutes Greater than 50%  of this time was spent counseling and coordinating care related to the above assessment and plan.  Signed by: Roma Schanz, DO  09/02/2014, 5:45 PM  Please contact Palliative Medicine Team phone at [phone] for questions and concerns.

## 2014-09-02 NOTE — Progress Notes (Signed)
Removed and returned telemetry box 20.  CCMD notified.

## 2014-09-02 NOTE — Progress Notes (Signed)
Subjective:  Co abdominal discomfort  After food/ dc with meds/ wants to try [place] breeze tid / tolerated hd yesterday  Objective Vital signs in last 24 hours: Filed Vitals:   09/01/14 1730 09/01/14 2145 09/02/14 0501 09/02/14 1004  BP: 137/48 138/51 134/46 123/51  Pulse: 75 84 73 75  Temp: 97.9 F (36.6 C) 98.1 F (36.7 C) 98.2 F (36.8 C) 98.4 F (36.9 C)  TempSrc: Oral Oral Oral Oral  Resp: 16 16 17 18   Height:      Weight:  62.324 kg (137 lb 6.4 oz)    SpO2: 96% 98% 98% 96%   Weight change: -2.223 kg (-4 lb 14.4 oz) Physical Exam: General- Alert, no distress Chest -CTA bilat Heart-RRR 2/6 early syst harsh M, no Rub or gallop Abd- slightly tender mid SP and LLQ areas, +BS/ ND Extremities- No LE or UE edema Dialysis Access:- R U A AVF pos. bruit  HD: [facility] 3.5h F160 65.5kg 2/2.25 [place] Heparin none RUA AVF Aranesp 120/wk, Venofer 50 /wk, Calcitriol 0.25 ug TIW Hb 10.0, tsat 14%, ferr 580, Ca/P 9.4/5.4, 221pth  Problem/Plan:  1. Dyspnea / acute on chronic resp failure -resolved with HD below edw now 62.3 yesterday 2. Abd pain / nausea - Cdif neg/ Gi Signed OFF/CT showed Sig Diverticulitis on po flagyl and cipro/ 3. 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 ESRD - on MWF HD 5. COPD- on home O2 6. 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 Hx afib / RVR- now improved vr  8. Anemia-hgb 9.2>10.3 on esa/fe 9. MBD- phoslo and vit d 10. Hx legionella PNA 11. Deconditioning - NH per PT/ daughter in room now agrees and pt NH  Ernest Haber, PA-C [facility] Beeper [phone] 09/02/2014,1:38 PM  LOS: 6 days   Pt seen, examined and agree w A/P as above.  Kelly Splinter MD pager [phone]    cell [phone] 09/02/2014, 6:33 PM    Labs: Basic Metabolic Panel:  Recent Labs Lab 08/28/14 0042 08/30/14 0500 09/01/14 1009  NA 129* 135* 137  K 5.2 4.8 5.3  CL 87* 96 98  CO2 24 25 26    GLUCOSE 60* 79 81  BUN 42* 24* 18  CREATININE 5.76* 5.30* 5.04*  CALCIUM 7.9* 8.2* 8.7  PHOS  --   --  3.8   Liver Function Tests:  Recent Labs Lab 08/14/2014 1150 09/01/14 1009  AST 16  --   ALT 11  --   ALKPHOS 133*  --   BILITOT 0.5  --   PROT 5.7*  --   ALBUMIN 2.2* 2.1*    Recent Labs Lab 08/05/2014 1150  LIPASE 10*   No results for input(s): AMMONIA in the last 168 hours. CBC:  Recent Labs Lab 08/03/2014 1150 08/06/2014 2025 08/28/14 0725 08/30/14 0500 09/01/14 1009  WBC 12.0* 9.4 4.4 5.9 8.4  NEUTROABS 10.2*  --   --   --   --   HGB 10.4* 9.5* 9.7* 9.2* 10.3*  HCT 36.0 33.4* 33.4* 32.6* 38.1  MCV 101.7* 99.1 97.9 99.7 102.7*  PLT 211 186 187 158 180   Cardiac Enzymes:  Recent Labs Lab 08/06/2014 1935 08/28/14 0042 08/28/14 0620  TROPONINI <0.30 <0.30 <0.30   CBG:  Recent Labs Lab 09/01/14 0823 09/01/14 1710 09/01/14 2144 09/02/14 0749 09/02/14 1119  GLUCAP 74 92 80 82 130*    Studies/Results: No results found. Medications:   . amiodarone  200 mg Oral  Daily  . antiseptic oral rinse  7 mL Mouth Rinse BID  . calcitRIOL  0.25 mcg Oral Q M,W,F-HD  . calcium acetate  1,334 mg Oral TID WC  . ciprofloxacin  500 mg Oral QHS  . darbepoetin (ARANESP) injection - DIALYSIS  125 mcg Intravenous Q Wed-HD  . ezetimibe  10 mg Oral QHS  . feeding supplement (NEPRO CARB STEADY)  237 mL Oral Daily  . feeding supplement (RESOURCE BREEZE)  1 Container Oral Q1500  . ferric gluconate (FERRLECIT/NULECIT) IV  62.5 mg Intravenous Q Wed-HD  . heparin  5,000 Units Subcutaneous 3 times per day  . insulin aspart  0-5 Units Subcutaneous QHS  . insulin aspart  0-9 Units Subcutaneous TID WC  . metoCLOPramide      . metoCLOPramide (REGLAN) injection  5 mg Intravenous 3 times per day  . metroNIDAZOLE  500 mg Oral 3 times per day  . multivitamin  1 tablet Oral QHS  . rOPINIRole  1 mg Oral BID

## 2014-09-02 NOTE — Progress Notes (Signed)
[facility]                                                                              Patient Demographics  [name], is a 73 y.o. female, DOB - 11/17/1940, KZS:[id number]  Admit date - 08/21/2014   Admitting Physician Debbe Odea, MD  Outpatient Primary MD for the patient is Gilford Rile, MD  LOS - 6   Chief Complaint  Patient presents with  . Shortness of Breath      HPI on 08/22/2014 by Dr. Debbe Odea [name] is a 73 y.o. 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 Patient comes in to the hospital with a complaint of abdominal pain nausea vomiting and diarrhea. She has been having these symptoms on and off for about 2 weeks now but they have become much worse. She states that she is having diarrhea despite the fact that she is barely eating. She sometimes has to have BMs in the middle of the night. She has omitted dairy from her diet without any notable improvement. She's not noticed any blood in her stool or her vomitus. She noted epigastric pain yesterday and took an aspirin and eventually the pain faded away. It recurred today and she again took an aspirin and currently is no longer in pain. She has not had any fevers or chills. She is also found to have pulmonary edema on exam and does complain of shortness of breath for the past day. She is not requiring more than her usual 3 L of oxygen at this time. She does not have any cough and has not had any wheezing. She is due for dialysis tomorrow morning and has been evaluated by the renal team who plan on dialyzing her tomorrow.  Assessment & Plan   Abdominal pain with nausea and vomiting and diarrhea likely secondary to acute  diverticulitis/fistula -Per patient, abdominal pain has improved -CT abdomen/pelvis 11/29: Slight increased inflammation/stranding question acute diverticulitis, few tiny foci of extraluminal gas extending to the vaginal cuff could represent fistula -C. difficile PCR negative -GI pathogen panel positive for norovirus -Continue Cipro and Flagyl, Imodium PRN -Gastroenterology was consulted and appreciated, recommended symptomatic treatment for postprandial stooling and signed off on 08/30/2014 -Gen. 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   -Patient is not a good candidate for surgery or any invasive intervention at this time due to her significant morbidity and mortality risk  End-stage renal disease requiring hemodialysis -Dialyzes on Monday, Wednesday, Friday -Nephrology consultation appreciated  Urinary tract infection  Dyspnea/acute on chronic respiratory failure -Multifactorial to patient's acute on chronic systolic heart failure versus COPD -Appears to have resolved with hemodialysis  Acute on chronic systolic heart failure -Echocardiogram September 21 as 15 shows EF of 40% -Patient has chronic pleural effusions which appear to be worse as compared to prior films -Being managed with dialysis   Paroxysmal  atrial fibrillation  -Continue amiodarone  -Patient is not on anticoagulation   COPD, GOLD II -Continue 3L oxygen via nasal cannula and inhalers   Diabetes mellitus, type II -Diet-controlled -Patient was told to take Glucotrol if her blood sugars are greater than 150 -Continue sensitive insulin sliding scale  Hyperlipidemia -Continue zetia  Anemia of chronic disease -Hemoglobin appears stable -Continue to monitor CBC  Code Status: Full   Family Communication: None at bedside, daughter via phone   Disposition Plan: Admitted  Time Spent in minutes   45  minutes  Procedures  None  Consults   Gen. surgery Nephrology Gastroenterology Palliative care   DVT Prophylaxis  heparin  Lab Results  Component Value Date   PLT 180 09/01/2014    Medications  Scheduled Meds: . amiodarone  200 mg Oral Daily  . antiseptic oral rinse  7 mL Mouth Rinse BID  . calcitRIOL  0.25 mcg Oral Q M,W,F-HD  . calcium acetate  1,334 mg Oral TID WC  . ciprofloxacin  500 mg Oral QHS  . darbepoetin (ARANESP) injection - DIALYSIS  125 mcg Intravenous Q Wed-HD  . ezetimibe  10 mg Oral QHS  . feeding supplement (NEPRO CARB STEADY)  237 mL Oral Daily  . feeding supplement (RESOURCE BREEZE)  1 Container Oral Q1500  . ferric gluconate (FERRLECIT/NULECIT) IV  62.5 mg Intravenous Q Wed-HD  . heparin  5,000 Units Subcutaneous 3 times per day  . insulin aspart  0-5 Units Subcutaneous QHS  . insulin aspart  0-9 Units Subcutaneous TID WC  . metoCLOPramide (REGLAN) injection  5 mg Intravenous 3 times per day  . metroNIDAZOLE  500 mg Oral 3 times per day  . multivitamin  1 tablet Oral QHS  . rOPINIRole  1 mg Oral BID   Continuous Infusions:  PRN Meds:.acetaminophen **OR** acetaminophen, aspirin, HYDROcodone-acetaminophen, ipratropium-albuterol, loperamide, [DISCONTINUED] ondansetron **OR** ondansetron (ZOFRAN) IV, ondansetron  Antibiotics    Anti-infectives    Start     Dose/Rate Route Frequency Ordered Stop   08/30/14 2200  ciprofloxacin (CIPRO) tablet 500 mg     500 mg Oral Daily at bedtime 08/30/14 1157     08/30/14 1400  metroNIDAZOLE (FLAGYL) tablet 500 mg     500 mg Oral 3 times per day 08/30/14 1157     08/28/14 1800  metroNIDAZOLE (FLAGYL) IVPB 500 mg  Status:  Discontinued     500 mg100 mL/hr over 60 Minutes Intravenous Every 8 hours 08/28/14 1013 08/30/14 1157   08/28/14 1100  ciprofloxacin (CIPRO) IVPB 400 mg  Status:  Discontinued     400 mg200 mL/hr over 60 Minutes Intravenous Daily at bedtime 08/28/14 0916 08/30/14 1157   08/28/14 1000   metroNIDAZOLE (FLAGYL) IVPB 500 mg  Status:  Discontinued     500 mg100 mL/hr over 60 Minutes Intravenous Every 12 hours 08/28/14 0916 08/28/14 1013        Subjective:   Ellouise Newer seen and examined today.  Patient feels her abdominal pain has improved slightly. She denies any nausea or vomiting. Patient denies any chest pain or shortness of breath.   Objective:   Filed Vitals:   09/01/14 1730 09/01/14 2145 09/02/14 0501 09/02/14 1004  BP: 137/48 138/51 134/46 123/51  Pulse: 75 84 73 75  Temp: 97.9 F (36.6 C) 98.1 F (36.7 C) 98.2 F (36.8 C) 98.4 F (36.9 C)  TempSrc: Oral Oral Oral Oral  Resp: 16 16 17 18   Height:      Weight:  62.324 kg (  137 lb 6.4 oz)    SpO2: 96% 98% 98% 96%    Wt Readings from Last 3 Encounters:  09/01/14 62.324 kg (137 lb 6.4 oz)  07/11/14 69.128 kg (152 lb 6.4 oz)  06/24/14 70.217 kg (154 lb 12.8 oz)     Intake/Output Summary (Last 24 hours) at 09/02/14 1029 Last data filed at 09/02/14 0800  Gross per 24 hour  Intake    120 ml  Output      0 ml  Net    120 ml    Exam  General: Well developed, NAD, appears stated age  HEENT: NCAT, PERRLA, EOMI, Anicteic Sclera, mucous membranes moist.   Cardiovascular: S1 S2 auscultated, no rubs, murmurs or gallops. Regular rate and rhythm.  Respiratory: Clear to auscultation bilaterally with equal chest rise  Abdomen: Soft, nontender, nondistended, + bowel sounds  Extremities: warm dry without cyanosis clubbing or edema  Neuro: AAOx3, no focal deficits  Skin: Without rashes exudates or nodules  Psych: Appropriate mood and affect   Data Review   Micro Results Recent Results (from the past 240 hour(s))  Clostridium Difficile by PCR     Status: None   Collection Time: 08/25/2014  2:14 PM  Result Value Ref Range Status   C difficile by pcr NEGATIVE NEGATIVE Final  MRSA PCR Screening     Status: None   Collection Time: 08/08/2014  6:57 PM  Result Value Ref Range Status   MRSA by PCR NEGATIVE  NEGATIVE Final    Comment:        The GeneXpert MRSA Assay (FDA approved for NASAL specimens only), is one component of a comprehensive MRSA colonization surveillance program. It is not intended to diagnose MRSA infection nor to guide or monitor treatment for MRSA infections.     Radiology Reports Dg Chest 2 View  08/14/2014   CLINICAL DATA:  Shortness of breath.  EXAM: CHEST  2 VIEW  COMPARISON:  08/23/2014  FINDINGS: There are bilateral pleural effusions, right side greater the left. The right pleural effusion is moderate for size. Diffuse interstitial lung densities may represent interstitial pulmonary edema. Evidence for a coronary artery stent. Heart size is grossly normal.  IMPRESSION: Bilateral pleural effusions, right side greater the left.  Prominent lung markings suggest interstitial edema.   Electronically Signed   By: Markus Daft M.D.   On: 08/25/2014 13:47   Ct Abdomen Pelvis W Contrast  08/01/2014   CLINICAL DATA:  73 year old female with abdominal pelvic pain, vomiting and diarrhea.  EXAM: CT ABDOMEN AND PELVIS WITH CONTRAST  TECHNIQUE: Multidetector CT imaging of the abdomen and pelvis was performed using the standard protocol following bolus administration of intravenous contrast.  CONTRAST:  170mL OMNIPAQUE IOHEXOL 300 MG/ML  SOLN  COMPARISON:  06/06/2014 and prior CTs  FINDINGS: Moderate to large bilateral pleural effusions have slightly increased with bilateral lower lung atelectasis, moderate to severe on the right and moderate on the left.  The liver, pancreas and adrenal glands are unchanged in appearance with fullness of the left adrenal gland again noted.  Mild gallbladder distention and upper limits of normal CBD size (8 mm) are unchanged from 05/22/2014.  Peripheral low-density areas within the spleen appear unchanged from 05/22/2014 and likely represent infarcts.  The patient is status post right nephrectomy.  Severe left renal atrophy and left renal cysts again noted.   There is no evidence of abdominal aortic aneurysm or enlarged lymph nodes.  The patient is status post hysterectomy.  Slightly increased inflammation/stranding  along the distal sigmoid colon noted with mild colonic wall thickening suspicious for diverticulitis. A few tiny foci of extraluminal gas that extends from this region to the vaginal cuff could represent a fistula -correlate clinically and consider further evaluation.  There is no evidence of abscess or bowel obstruction.  No acute or suspicious bony abnormalities are identified.  IMPRESSION: Slightly increased inflammation/stranding along the distal sigmoid colon with mild adjacent wall thickening -question acute diverticulitis. A few tiny foci of extraluminal gas extends to the vaginal cuff could represent a fistula. Consider further evaluation as indicated.  Enlarging moderate to large bilateral pleural effusions and bilateral lower lung atelectasis, right greater than left.  Mild gallbladder distention and upper limits of normal CBD size -unchanged from 05/22/2014.  Status post right nephrectomy and severe left renal atrophy.   Electronically Signed   By: Hassan Rowan M.D.   On: 08/12/2014 22:25    CBC  Recent Labs Lab 08/24/2014 1150 08/26/2014 2025 08/28/14 0725 08/30/14 0500 09/01/14 1009  WBC 12.0* 9.4 4.4 5.9 8.4  HGB 10.4* 9.5* 9.7* 9.2* 10.3*  HCT 36.0 33.4* 33.4* 32.6* 38.1  PLT 211 186 187 158 180  MCV 101.7* 99.1 97.9 99.7 102.7*  MCH 29.4 28.2 28.4 28.1 27.8  MCHC 28.9* 28.4* 29.0* 28.2* 27.0*  RDW 16.0* 16.0* 16.1* 15.9* 15.7*  LYMPHSABS 1.0  --   --   --   --   MONOABS 0.7  --   --   --   --   EOSABS 0.1  --   --   --   --   BASOSABS 0.0  --   --   --   --     Chemistries   Recent Labs Lab 08/14/2014 1150 08/02/2014 1935 08/28/14 0042 08/30/14 0500 09/01/14 1009  NA 134*  --  129* 135* 137  K 5.2  --  5.2 4.8 5.3  CL 90*  --  87* 96 98  CO2 30  --  24 25 26   GLUCOSE 75  --  60* 79 81  BUN 39*  --  42* 24* 18   CREATININE 5.42* 5.74* 5.76* 5.30* 5.04*  CALCIUM 8.5  --  7.9* 8.2* 8.7  AST 16  --   --   --   --   ALT 11  --   --   --   --   ALKPHOS 133*  --   --   --   --   BILITOT 0.5  --   --   --   --    ------------------------------------------------------------------------------------------------------------------ estimated creatinine clearance is 8.7 mL/min (by C-G formula based on Cr of 5.04). ------------------------------------------------------------------------------------------------------------------ No results for input(s): HGBA1C in the last 72 hours. ------------------------------------------------------------------------------------------------------------------ No results for input(s): CHOL, HDL, LDLCALC, TRIG, CHOLHDL, LDLDIRECT in the last 72 hours. ------------------------------------------------------------------------------------------------------------------ No results for input(s): TSH, T4TOTAL, T3FREE, THYROIDAB in the last 72 hours.  Invalid input(s): FREET3 ------------------------------------------------------------------------------------------------------------------ No results for input(s): VITAMINB12, FOLATE, FERRITIN, TIBC, IRON, RETICCTPCT in the last 72 hours.  Coagulation profile No results for input(s): INR, PROTIME in the last 168 hours.  No results for input(s): DDIMER in the last 72 hours.  Cardiac Enzymes  Recent Labs Lab 08/26/2014 1935 08/28/14 0042 08/28/14 0620  TROPONINI <0.30 <0.30 <0.30   ------------------------------------------------------------------------------------------------------------------ Invalid input(s): POCBNP    Jarmar Rousseau D.O. on 09/02/2014 at 10:29 AM  Between 7am to 7pm - Pager - [phone]  After 7pm go to [email or web address] - password [email or web address]  And look for the  night coverage person covering for me after hours  [facility] Office  [phone]

## 2014-09-03 ENCOUNTER — Inpatient Hospital Stay (HOSPITAL_COMMUNITY): Payer: Medicare Other

## 2014-09-03 ENCOUNTER — Encounter (HOSPITAL_COMMUNITY): Payer: Self-pay | Admitting: Radiology

## 2014-09-03 LAB — GLUCOSE, CAPILLARY
GLUCOSE-CAPILLARY: 76 mg/dL (ref 70–99)
Glucose-Capillary: 100 mg/dL — ABNORMAL HIGH (ref 70–99)
Glucose-Capillary: 73 mg/dL (ref 70–99)

## 2014-09-03 LAB — BASIC METABOLIC PANEL
ANION GAP: 18 — AB (ref 5–15)
BUN: 17 mg/dL (ref 6–23)
CHLORIDE: 97 meq/L (ref 96–112)
CO2: 24 meq/L (ref 19–32)
Calcium: 9.9 mg/dL (ref 8.4–10.5)
Creatinine, Ser: 4.53 mg/dL — ABNORMAL HIGH (ref 0.50–1.10)
GFR calc Af Amer: 10 mL/min — ABNORMAL LOW (ref >90)
GFR calc non Af Amer: 9 mL/min — ABNORMAL LOW (ref >90)
Glucose, Bld: 93 mg/dL (ref 70–99)
Potassium: 5.7 mEq/L — ABNORMAL HIGH (ref 3.7–5.3)
Sodium: 139 mEq/L (ref 137–147)

## 2014-09-03 LAB — CBC
HCT: 45.3 % (ref 36.0–46.0)
Hemoglobin: 13.3 g/dL (ref 12.0–15.0)
MCH: 29.8 pg (ref 26.0–34.0)
MCHC: 29.4 g/dL — ABNORMAL LOW (ref 30.0–36.0)
MCV: 101.3 fL — ABNORMAL HIGH (ref 78.0–100.0)
PLATELETS: 147 10*3/uL — AB (ref 150–400)
RBC: 4.47 MIL/uL (ref 3.87–5.11)
RDW: 16.1 % — AB (ref 11.5–15.5)
WBC: 31.4 10*3/uL — AB (ref 4.0–10.5)

## 2014-09-03 MED ORDER — PHENAZOPYRIDINE HCL 100 MG PO TABS
100.0000 mg | ORAL_TABLET | Freq: Four times a day (QID) | ORAL | Status: DC
  Filled 2014-09-03 (×3): qty 1

## 2014-09-03 MED ORDER — IOHEXOL 300 MG/ML  SOLN: 25.0000 mL | INTRAMUSCULAR | Status: AC

## 2014-09-03 MED ORDER — DARBEPOETIN ALFA 40 MCG/0.4ML IJ SOSY: 40.0000 ug | PREFILLED_SYRINGE | INTRAMUSCULAR | Status: DC

## 2014-09-03 MED ORDER — LIDOCAINE HCL 2 % EX GEL
1.0000 "application " | Freq: Once | CUTANEOUS | Status: AC
  Administered 2014-09-03: 1
  Filled 2014-09-03: qty 5

## 2014-09-03 MED ORDER — NALOXONE HCL 0.4 MG/ML IJ SOLN
INTRAMUSCULAR | Status: AC
  Administered 2014-09-03: 0.4 mg
  Filled 2014-09-03: qty 1

## 2014-09-03 MED ORDER — NALOXONE HCL 0.4 MG/ML IJ SOLN: 0.4000 mg | INTRAMUSCULAR | Status: DC | PRN

## 2014-09-03 MED ORDER — IOHEXOL 300 MG/ML  SOLN
100.0000 mL | Freq: Once | INTRAMUSCULAR | Status: AC | PRN
  Administered 2014-09-03: 100 mL via INTRAVENOUS

## 2014-09-03 MED ORDER — HYDROMORPHONE HCL 1 MG/ML IJ SOLN
0.5000 mg | INTRAMUSCULAR | Status: DC | PRN
  Administered 2014-09-03: 0.5 mg via INTRAVENOUS
  Administered 2014-09-03: 0.25 mg via INTRAVENOUS
  Filled 2014-09-03: qty 1

## 2014-09-03 MED ORDER — CALCIUM ACETATE 667 MG PO CAPS
667.0000 mg | ORAL_CAPSULE | Freq: Three times a day (TID) | ORAL | Status: DC
  Filled 2014-09-03 (×3): qty 1

## 2014-09-07 ENCOUNTER — Encounter (HOSPITAL_COMMUNITY): Payer: Self-pay | Admitting: Surgery

## 2014-09-29 NOTE — Progress Notes (Signed)
Chaplain responded to page that patient had expired. When chaplain arrived, multiple family members were in room and in the hallway. Status of death was just being confirmed and some family members, especially patient's children, were experiencing intense grief.  One daughter in particular, the youngest, [name], began to hyperventilate and needed assistance into the hall.  Chaplain attended to daughter with the help of other family members, encouraging her to take deep breaths.  Chaplain then assisted nurse in escorting family members to the conference room. Provided some drinks to family members.  RN ordered a compassion cart.  Chaplain listened as family related stories about patient.  Chaplain offered prayer, affirmed family's grieving process and oriented family to ongoing support.  [name] [phone]   2014-09-19 2300  Clinical Encounter Type  Visited With Family  Visit Type Death  Referral From Nurse  Spiritual Encounters  Spiritual Needs Grief support;Prayer  Stress Factors  Family Stress Factors Major life changes

## 2014-09-29 NOTE — Progress Notes (Signed)
Palliative Medicine Team Progress Note  Severe poorly controlled pain throughout the night and persists today. CT obtained this AM.   I met with patient, her three daughters and one son to discuss goals of care.  1. We discussed the findings of her CT which show interval SBO, she is not an operative candidate in general-her stomach is significantly distended- suspect from prior intra-abdominal infection and adhesions along with opiate pain medications.  She has agreed to NG tube for 24 hours for decompression-this will help significantly with her pain and symptoms. Whether or not this will resolve on its own remains unclear.  Continue scheduled Reglan.  NPO, ice chips/sips of liquids ok  OOB-mobility as tolerated  2. We talked about holistic goals of care- the complexity of all of her health problems and overall very poor prognosis for recovery-I suspect her intra-abdominal issues will eventually lead to her death.   Discussed QOL and her level of suffering- she is basically at the point where she "doesnt want to go on if it cant be any better than this". She has a very reasonable approach to dying and actually comforted her own children on the topic-additionally we talked about how she would like to live for the time she has left and home is very important to her.   She may be able to continue HD and qualify for hospice care under her intra-abdominal diagnoses/malnutrition. Daughters are talking about how to care for her at home.  3. She desires aggressive management of her symptoms.  Recommendations:  1. NG tube trial for decompression for 24 hours-symptoms control 2. IV Dilaudid 0.$RemoveBefor'5mg'[id number]$  q2 prn 3. Diazepam for agitation prn 4. Azo for cystitis pain scheduled. 5. Continue antibiotics for now. 6. DNR  Will follow closely.   Time: 1PM-2:15PM Total: 75 minutes  Lane Hacker, [place]

## 2014-09-29 NOTE — Clinical Social Work Note (Signed)
CSW continues to follow for d/c planning needs. CSW spoke with patient's daughter Fulton Reek) and provided her with bed offers: [facility] and [place], [place]. Per Fraser Din, she will discuss the bed offers with her sister and follow-up with CSW tomorrow as family has a meeting with Palliative Care on this date. CSW to follow tomorrow.  Olivia Lopez de Gutierrez, [place] Weekend Clinical Social Worker [phone]

## 2014-09-29 NOTE — Progress Notes (Signed)
Subjective:  Tells me no change in Abdominal discomfort / tolerating Breeze liquid and just  ordered rest of meals for today /hd in am  No sob  Objective Vital signs in last 24 hours: Filed Vitals:   09/02/14 1747 09/02/14 2139 Sep 08, 2014 0525 08-Sep-2014 0954  BP: 149/53 125/44 98/52 91/50   Pulse: 84 84 90 88  Temp: 98.8 F (37.1 C) 98 F (36.7 C) 97.6 F (36.4 C) 98 F (36.7 C)  TempSrc: Oral Oral Oral Oral  Resp: 18 17 18 18   Height:      Weight:  63.05 kg (139 lb)    SpO2: 98% 98% 98% 98%   Weight change: 0.726 kg (1 lb 9.6 oz) Physical Exam: General- Alert, no distress Chest -CTA bilat Heart-RRR 2/6 early syst harsh M, no Rub or gallop Abd- slightly tender mid SP and LLQ areas, +BS/ ND Extremities- No LE or UE edema Dialysis Access:- R U A AVF pos. bruit  HD: [facility] [place] 3.5h F160 65.5kg 2/2.25 [place] Heparin none RUA AVF Aranesp 120/wk, Venofer 50 /wk, Calcitriol 0.25 ug TIW Hb 10.0, tsat 14%, ferr 580, Ca/P 9.4/5.4, 221pth  Problem/Plan:  1. Dyspnea / acute on chronic resp failure -resolved with HD below edw now 63.05 yesterday 2. Abd pain / nausea - Cdif neg/ Gi Signed OFF/CT showed Sig Diverticulitis on po flagyl and cipro/- Noted Palliative care to meet today 12 noon with pt and daughters concerning  issues 3. 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 ESRD - on MWF HD/ hd in am 5. COPD- on home O2 6. 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 Hx afib / RVR- now improved vr  8. Anemia-hgb 9.2>10.3 .11.5  Aranesp 125 decr to 40  q wed hd on /fe 9. 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 Hx legionella PNA 11. Deconditioning - NH per PT/ daughter yesterday agrees to placement for rehab  Ernest Haber, PA-C [place] [phone] 09-08-2014,9:55 AM  LOS: 7 days   Pt seen, examined and agree w A/P as above.  Plan HD tomorrow.  Pall care meeting w family today to discuss goals of care. Kelly Splinter MD pager [phone]    cell [phone] 09/08/14, 12:49 PM    Labs: Basic Metabolic Panel:  Recent Labs Lab 08/30/14 0500 09/01/14 1009 September 08, 2014 0550  NA 135* 137 139  K 4.8 5.3 5.7*  CL 96 98 97  CO2 25 26 24   GLUCOSE 79 81 93  BUN 24* 18 17  CREATININE 5.30* 5.04* 4.53*  CALCIUM 8.2* 8.7 9.9  PHOS  --  3.8  --    Liver Function Tests:  Recent Labs Lab 08/18/2014 1150 09/01/14 1009  AST 16  --   ALT 11  --   ALKPHOS 133*  --   BILITOT 0.5  --   PROT 5.7*  --   ALBUMIN 2.2* 2.1*    Recent Labs Lab 08/18/2014 1150  LIPASE 10*   No results for input(s): AMMONIA in the last 168 hours. CBC:  Recent Labs Lab 08/06/2014 1150 08/01/2014 2025 08/28/14 0725 08/30/14 0500 09/01/14 1009 09/02/14 1315  WBC 12.0* 9.4 4.4 5.9 8.4 13.1*  NEUTROABS 10.2*  --   --   --   --   --   HGB 10.4* 9.5* 9.7* 9.2* 10.3* 11.5*  HCT 36.0 33.4* 33.4* 32.6*  38.1 41.1  MCV 101.7* 99.1 97.9 99.7 102.7* 102.2*  PLT 211 186 187 158 180 189   Cardiac Enzymes:  Recent Labs Lab 08/01/2014 1935 08/28/14 0042 08/28/14 0620  TROPONINI <0.30 <0.30 <0.30   CBG:  Recent Labs Lab 09/02/14 0749 09/02/14 1119 09/02/14 1643 09/02/14 2137 September 26, 2014 0738  GLUCAP 82 130* 125* 109* 76    Studies/Results: Dg Abd Portable 1v  09/02/2014   CLINICAL DATA:  Generalized abdominal pain x1 week  EXAM: PORTABLE ABDOMEN - 1 VIEW  COMPARISON:  CT abdomen pelvis dated 08/13/2014  FINDINGS: Nonobstructive bowel gas pattern.  Surgical clips in the right mid abdomen.  Mild degenerative changes of the lumbar spine.  IMPRESSION: No evidence of bowel obstruction.   Electronically Signed   By: Julian Hy M.D.   On: 09/02/2014 18:25   Medications:   . amiodarone  200 mg Oral Daily  . antiseptic oral rinse  7 mL Mouth Rinse BID  . calcitRIOL  0.25 mcg Oral Q M,W,F-HD  . calcium acetate  1,334 mg Oral TID WC  .  ciprofloxacin  500 mg Oral QHS  . [START ON 09/06/2014] darbepoetin (ARANESP) injection - DIALYSIS  40 mcg Intravenous Q Wed-HD  . ezetimibe  10 mg Oral QHS  . feeding supplement (NEPRO CARB STEADY)  237 mL Oral Daily  . feeding supplement (RESOURCE BREEZE)  1 Container Oral Q1500  . fentaNYL  12.5 mcg Intravenous Once  . ferric gluconate (FERRLECIT/NULECIT) IV  62.5 mg Intravenous Q Wed-HD  . heparin  5,000 Units Subcutaneous 3 times per day  . insulin aspart  0-5 Units Subcutaneous QHS  . insulin aspart  0-9 Units Subcutaneous TID WC  . metoCLOPramide (REGLAN) injection  5 mg Intravenous 3 times per day  . metroNIDAZOLE  500 mg Oral 3 times per day  . multivitamin  1 tablet Oral QHS  . pantoprazole (PROTONIX) IV  40 mg Intravenous Q12H  . rOPINIRole  1 mg Oral BID

## 2014-09-29 NOTE — Discharge Summary (Signed)
Death Summary  DEMETRI KERMAN ZOX:[id number] DOB: 1941-06-15 DOA: 2014/09/01  PCP: Gilford Rile, MD PCP/Office notified:   Admit date: 09/01/2014 Date of Death: 09-08-2014  Final Diagnoses:  Abdominal pain, vomiting, and diarrhea Acute diveritculitis Small bowel obstruction Colovaginal fistula ESRD Leukocytosis UTI HTN (hypertension) Paroxysmal Atrial fibrillation COPD GOLD II Protein-calorie malnutrition, severe Acute on chronic Systolic Congestive heart disease Diabetes mellitus, type II Hyperlipidemia Anemia of chronic disease  History of present illness:  on Sep 01, 2014 by Dr. Blondell Reveal is a 74 y.o. 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 Patient comes in to the hospital with a complaint of abdominal pain nausea vomiting and diarrhea. She has been having these symptoms on and off for about 2 weeks now but they have become much worse. She states that she is having diarrhea despite the fact that she is barely eating. She sometimes has to have BMs in the middle of the night. She has omitted dairy from her diet without any notable improvement. She's not noticed any blood in her stool or her vomitus. She noted epigastric pain yesterday and took an aspirin and eventually the pain faded away. It recurred today and she again took an aspirin and currently is no longer in pain. She has not had any fevers or chills. She is also found to have pulmonary edema on exam and does complain of shortness of breath for the past day. She is not requiring more than her usual 3 L of oxygen at this time. She does not have any cough and has not had any wheezing. She is due for dialysis tomorrow morning and  has been evaluated by the renal team who plan on dialyzing her tomorrow.  Hospital Course:  This is an unfortunate 74 year old female with multiple comorbidities that had worsening abdominal distention and pain. Patient was known to have a colovaginal fistula, however, was a poor surgical candidate.  Palliative care was consulted and appreciated to discuss goals of care.  Patient changed her CODE status to DNR.  She developed worsening leukocytosis which was prompted Stat repeat CT of the abdomen which showed small bowel obstruction.  Patient was given small dose of dilaudid before NG tube insertion, however, before NGT was inserted, she began to become unresponsive and hypoxic.  She was placed on a nonbreather and given small dose of narcan with limited response.  Multiple family were present at beside.  Patient passed at 8:15pm on September 08, 2014.  Death certificate graciously completed by Dr. Rhea Pink, palliative care.  Complete Hospital assessment/plan: Abdominal pain with nausea and vomiting and diarrhea likely secondary to acute diverticulitis/fistula -Per patient, abdominal pain has improved -CT abdomen/pelvis 2023/09/02: Slight increased inflammation/stranding question acute diverticulitis, few tiny foci of extraluminal gas extending to the vaginal cuff could represent fistula -C. difficile PCR negative -GI pathogen panel positive for norovirus -Continue Cipro and Flagyl, Imodium PRN -Gastroenterology was consulted and appreciated, recommended symptomatic treatment for postprandial stooling and signed off on 08/30/2014 -Gen. 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  -Patient is not a good candidate for surgery or any invasive intervention at this time due to her significant morbidity and mortality risk -Palliative care consulted  and patient was made DNR -Pain regimen was changed to include dilaudid, GI  cocktail, bentyl and diazepam.  Small bowel obstruction -Found on CT scan -NG tube was ordered, however, before insertion, patient was given small dose of diazepam and dilaudid for comfort.  Patient's abdomen became increasingly distended, breaths became labored.  Narcan given with minimal response. Patient passed.   Leukoyctosis -Likely secondary to the above, ?perf -Increased -Stat CT abd/pelvis with IV contrast: SBO  End-stage renal disease requiring hemodialysis -Dialyzes on Monday, Wednesday, Friday -Nephrology consultation appreciated  Urinary tract infection -UA 11/30: many bacteria, 3-6 WBC, moderate leukocytes -No culture from that time -Patient was on cipro  Dyspnea/acute on chronic respiratory failure -Multifactorial to patient's acute on chronic systolic heart failure versus COPD  Acute on chronic systolic heart failure -Echocardiogram September 21 as 35 shows EF of 40% -Patient has chronic pleural effusions which appear to be worse as compared to prior films -Was managed with dialysis   Paroxysmal atrial fibrillation  -Was continued on amiodarone  -Patient is not on anticoagulation   COPD, GOLD II -Continue 3L oxygen via nasal cannula and inhalers   Diabetes mellitus, type II -Diet-controlled -Was placed on sensitive insulin sliding scale  Hyperlipidemia  Anemia of chronic disease -Hemoglobin remained stable  Time: 15 minutes  SignedCristal Ford  [facility] 09/22/14, 8:48 PM

## 2014-09-29 NOTE — Progress Notes (Signed)
[facility]                                                                              Patient Demographics  [name], is a 74 y.o. female, DOB - 16-Mar-1941, WPY:[id number]  Admit date - 08/21/2014   Admitting Physician Debbe Odea, MD  Outpatient Primary MD for the patient is Gilford Rile, MD  LOS - 7   Chief Complaint  Patient presents with  . Shortness of Breath      HPI on 07/31/2014 by Dr. Debbe Odea [name] is a 74 y.o. 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 Patient comes in to the hospital with a complaint of abdominal pain nausea vomiting and diarrhea. She has been having these symptoms on and off for about 2 weeks now but they have become much worse. She states that she is having diarrhea despite the fact that she is barely eating. She sometimes has to have BMs in the middle of the night. She has omitted dairy from her diet without any notable improvement. She's not noticed any blood in her stool or her vomitus. She noted epigastric pain yesterday and took an aspirin and eventually the pain faded away. It recurred today and she again took an aspirin and currently is no longer in pain. She has not had any fevers or chills. She is also found to have pulmonary edema on exam and does complain of shortness of breath for the past day. She is not requiring more than her usual 3 L of oxygen at this time. She does not have any cough and has not had any wheezing. She is due for dialysis tomorrow morning and has been evaluated by the renal team who plan on dialyzing her tomorrow.  Assessment & Plan   Abdominal pain with nausea and vomiting and diarrhea likely secondary to acute  diverticulitis/fistula -Per patient, abdominal pain has improved -CT abdomen/pelvis 11/29: Slight increased inflammation/stranding question acute diverticulitis, few tiny foci of extraluminal gas extending to the vaginal cuff could represent fistula -C. difficile PCR negative -GI pathogen panel positive for norovirus -Continue Cipro and Flagyl, Imodium PRN -Gastroenterology was consulted and appreciated, recommended symptomatic treatment for postprandial stooling and signed off on 08/30/2014 -Gen. 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   -Patient is not a good candidate for surgery or any invasive intervention at this time due to her significant morbidity and mortality risk -Palliative care consulted and family meeting planned for today  Leukoyctosis -Likely secondary to the above, ?perf -Increased -Will speak with Dr. Hilma Favors regarding obtaining repeat abd CT  End-stage renal disease requiring hemodialysis -Dialyzes on Monday, Wednesday, Friday -Nephrology consultation appreciated  Urinary tract infection -UA 11/30: many bacteria, 3-6 WBC, moderate leukocytes -No culture from that time -Patient currently on cipro  Dyspnea/acute on chronic respiratory failure -Multifactorial to patient's acute on chronic systolic heart failure versus  COPD -Appears to have resolved with hemodialysis  Acute on chronic systolic heart failure -Echocardiogram September 21 as 15 shows EF of 40% -Patient has chronic pleural effusions which appear to be worse as compared to prior films -Being managed with dialysis   Paroxysmal atrial fibrillation  -Continue amiodarone  -Patient is not on anticoagulation   COPD, GOLD II -Continue 3L oxygen via nasal cannula and inhalers   Diabetes mellitus, type II -Diet-controlled -Patient was told to take Glucotrol if her blood sugars are greater than 150 -Continue  sensitive insulin sliding scale  Hyperlipidemia -Continue zetia  Anemia of chronic disease -Hemoglobin appears stable -Continue to monitor CBC  Code Status: Full   Family Communication: None at bedside  Disposition Plan: Admitted, pending palliative care/GOC meeting  Time Spent in minutes   30 minutes  Procedures  None  Consults   Gen. surgery Nephrology Gastroenterology Palliative care   DVT Prophylaxis  heparin  Lab Results  Component Value Date   PLT 147* 2014-09-29    Medications  Scheduled Meds: . amiodarone  200 mg Oral Daily  . antiseptic oral rinse  7 mL Mouth Rinse BID  . calcitRIOL  0.25 mcg Oral Q M,W,F-HD  . calcium acetate  1,334 mg Oral TID WC  . ciprofloxacin  500 mg Oral QHS  . [START ON 09/06/2014] darbepoetin (ARANESP) injection - DIALYSIS  40 mcg Intravenous Q Wed-HD  . ezetimibe  10 mg Oral QHS  . feeding supplement (NEPRO CARB STEADY)  237 mL Oral Daily  . feeding supplement (RESOURCE BREEZE)  1 Container Oral Q1500  . fentaNYL  12.5 mcg Intravenous Once  . ferric gluconate (FERRLECIT/NULECIT) IV  62.5 mg Intravenous Q Wed-HD  . heparin  5,000 Units Subcutaneous 3 times per day  . insulin aspart  0-5 Units Subcutaneous QHS  . insulin aspart  0-9 Units Subcutaneous TID WC  . metoCLOPramide (REGLAN) injection  5 mg Intravenous 3 times per day  . metroNIDAZOLE  500 mg Oral 3 times per day  . multivitamin  1 tablet Oral QHS  . pantoprazole (PROTONIX) IV  40 mg Intravenous Q12H  . rOPINIRole  1 mg Oral BID   Continuous Infusions:  PRN Meds:.acetaminophen **OR** acetaminophen, aspirin, diazepam, dicyclomine, gi cocktail, HYDROmorphone (DILAUDID) injection, ipratropium-albuterol, loperamide, [DISCONTINUED] ondansetron **OR** ondansetron (ZOFRAN) IV, ondansetron  Antibiotics    Anti-infectives    Start     Dose/Rate Route Frequency Ordered Stop   08/30/14 2200  ciprofloxacin (CIPRO) tablet 500 mg     500 mg Oral Daily at bedtime 08/30/14  1157     08/30/14 1400  metroNIDAZOLE (FLAGYL) tablet 500 mg     500 mg Oral 3 times per day 08/30/14 1157     08/28/14 1800  metroNIDAZOLE (FLAGYL) IVPB 500 mg  Status:  Discontinued     500 mg100 mL/hr over 60 Minutes Intravenous Every 8 hours 08/28/14 1013 08/30/14 1157   08/28/14 1100  ciprofloxacin (CIPRO) IVPB 400 mg  Status:  Discontinued     400 mg200 mL/hr over 60 Minutes Intravenous Daily at bedtime 08/28/14 0916 08/30/14 1157   08/28/14 1000  metroNIDAZOLE (FLAGYL) IVPB 500 mg  Status:  Discontinued     500 mg100 mL/hr over 60 Minutes Intravenous Every 12 hours 08/28/14 0916 08/28/14 1013        Subjective:   Ellouise Newer seen and examined today.  Patient continues to complain of abdominal pain.  She states she feels like she's dying.  She denies nausea or vomiting.  She denies chest pain or SOB.   Objective:   Filed Vitals:   09/02/14 1747 09/02/14 2139 September 27, 2014 0525 September 27, 2014 0954  BP: 149/53 125/44 98/52 91/50   Pulse: 84 84 90 88  Temp: 98.8 F (37.1 C) 98 F (36.7 C) 97.6 F (36.4 C) 98 F (36.7 C)  TempSrc: Oral Oral Oral Oral  Resp: 18 17 18 18   Height:      Weight:  63.05 kg (139 lb)    SpO2: 98% 98% 98% 98%    Wt Readings from Last 3 Encounters:  09/02/14 63.05 kg (139 lb)  07/11/14 69.128 kg (152 lb 6.4 oz)  06/24/14 70.217 kg (154 lb 12.8 oz)     Intake/Output Summary (Last 24 hours) at 09-27-2014 1010 Last data filed at 09-27-2014 0900  Gross per 24 hour  Intake    770 ml  Output      0 ml  Net    770 ml    Exam  General: Well developed, NAD, appears stated age  HEENT: NCAT, mucous membranes moist.   Cardiovascular: S1 S2 auscultated,  Regular rate and rhythm.  Respiratory: Clear to auscultation bilaterally with equal chest rise  Abdomen: Soft, tender, nondistended, + bowel sounds  Extremities: warm dry without cyanosis clubbing or edema  Neuro: AAOx3, no focal deficits  Skin: Without rashes exudates or nodules  Psych: Appropriate  mood and affect   Data Review   Micro Results Recent Results (from the past 240 hour(s))  Clostridium Difficile by PCR     Status: None   Collection Time: 08/22/2014  2:14 PM  Result Value Ref Range Status   C difficile by pcr NEGATIVE NEGATIVE Final  MRSA PCR Screening     Status: None   Collection Time: 08/26/2014  6:57 PM  Result Value Ref Range Status   MRSA by PCR NEGATIVE NEGATIVE Final    Comment:        The GeneXpert MRSA Assay (FDA approved for NASAL specimens only), is one component of a comprehensive MRSA colonization surveillance program. It is not intended to diagnose MRSA infection nor to guide or monitor treatment for MRSA infections.     Radiology Reports Dg Chest 2 View  08/26/2014   CLINICAL DATA:  Shortness of breath.  EXAM: CHEST  2 VIEW  COMPARISON:  08/23/2014  FINDINGS: There are bilateral pleural effusions, right side greater the left. The right pleural effusion is moderate for size. Diffuse interstitial lung densities may represent interstitial pulmonary edema. Evidence for a coronary artery stent. Heart size is grossly normal.  IMPRESSION: Bilateral pleural effusions, right side greater the left.  Prominent lung markings suggest interstitial edema.   Electronically Signed   By: Markus Daft M.D.   On: 08/09/2014 13:47   Ct Abdomen Pelvis W Contrast  07/31/2014   CLINICAL DATA:  74 year old female with abdominal pelvic pain, vomiting and diarrhea.  EXAM: CT ABDOMEN AND PELVIS WITH CONTRAST  TECHNIQUE: Multidetector CT imaging of the abdomen and pelvis was performed using the standard protocol following bolus administration of intravenous contrast.  CONTRAST:  176mL OMNIPAQUE IOHEXOL 300 MG/ML  SOLN  COMPARISON:  06/06/2014 and prior CTs  FINDINGS: Moderate to large bilateral pleural effusions have slightly increased with bilateral lower lung atelectasis, moderate to severe on the right and moderate on the left.  The liver, pancreas and adrenal glands are unchanged  in appearance with fullness of the left adrenal gland again noted.  Mild gallbladder distention and upper limits of normal CBD size (  8 mm) are unchanged from 05/22/2014.  Peripheral low-density areas within the spleen appear unchanged from 05/22/2014 and likely represent infarcts.  The patient is status post right nephrectomy.  Severe left renal atrophy and left renal cysts again noted.  There is no evidence of abdominal aortic aneurysm or enlarged lymph nodes.  The patient is status post hysterectomy.  Slightly increased inflammation/stranding along the distal sigmoid colon noted with mild colonic wall thickening suspicious for diverticulitis. A few tiny foci of extraluminal gas that extends from this region to the vaginal cuff could represent a fistula -correlate clinically and consider further evaluation.  There is no evidence of abscess or bowel obstruction.  No acute or suspicious bony abnormalities are identified.  IMPRESSION: Slightly increased inflammation/stranding along the distal sigmoid colon with mild adjacent wall thickening -question acute diverticulitis. A few tiny foci of extraluminal gas extends to the vaginal cuff could represent a fistula. Consider further evaluation as indicated.  Enlarging moderate to large bilateral pleural effusions and bilateral lower lung atelectasis, right greater than left.  Mild gallbladder distention and upper limits of normal CBD size -unchanged from 05/22/2014.  Status post right nephrectomy and severe left renal atrophy.   Electronically Signed   By: Hassan Rowan M.D.   On: 08/07/2014 22:25   Dg Abd Portable 1v  09/02/2014   CLINICAL DATA:  Generalized abdominal pain x1 week  EXAM: PORTABLE ABDOMEN - 1 VIEW  COMPARISON:  CT abdomen pelvis dated 08/22/2014  FINDINGS: Nonobstructive bowel gas pattern.  Surgical clips in the right mid abdomen.  Mild degenerative changes of the lumbar spine.  IMPRESSION: No evidence of bowel obstruction.   Electronically Signed   By:  Julian Hy M.D.   On: 09/02/2014 18:25    CBC  Recent Labs Lab 08/01/2014 1150  08/28/14 0725 08/30/14 0500 09/01/14 1009 09/02/14 1315 Sep 15, 2014 0850  WBC 12.0*  < > 4.4 5.9 8.4 13.1* 31.4*  HGB 10.4*  < > 9.7* 9.2* 10.3* 11.5* 13.3  HCT 36.0  < > 33.4* 32.6* 38.1 41.1 45.3  PLT 211  < > 187 158 180 189 147*  MCV 101.7*  < > 97.9 99.7 102.7* 102.2* 101.3*  MCH 29.4  < > 28.4 28.1 27.8 28.6 29.8  MCHC 28.9*  < > 29.0* 28.2* 27.0* 28.0* 29.4*  RDW 16.0*  < > 16.1* 15.9* 15.7* 15.7* 16.1*  LYMPHSABS 1.0  --   --   --   --   --   --   MONOABS 0.7  --   --   --   --   --   --   EOSABS 0.1  --   --   --   --   --   --   BASOSABS 0.0  --   --   --   --   --   --   < > = values in this interval not displayed.  Chemistries   Recent Labs Lab 08/22/2014 1150 08/19/2014 1935 08/28/14 0042 08/30/14 0500 09/01/14 1009 15-Sep-2014 0550  NA 134*  --  129* 135* 137 139  K 5.2  --  5.2 4.8 5.3 5.7*  CL 90*  --  87* 96 98 97  CO2 30  --  24 25 26 24   GLUCOSE 75  --  60* 79 81 93  BUN 39*  --  42* 24* 18 17  CREATININE 5.42* 5.74* 5.76* 5.30* 5.04* 4.53*  CALCIUM 8.5  --  7.9* 8.2* 8.7 9.9  AST  16  --   --   --   --   --   ALT 11  --   --   --   --   --   ALKPHOS 133*  --   --   --   --   --   BILITOT 0.5  --   --   --   --   --    ------------------------------------------------------------------------------------------------------------------ estimated creatinine clearance is 9.7 mL/min (by C-G formula based on Cr of 4.53). ------------------------------------------------------------------------------------------------------------------ No results for input(s): HGBA1C in the last 72 hours. ------------------------------------------------------------------------------------------------------------------ No results for input(s): CHOL, HDL, LDLCALC, TRIG, CHOLHDL, LDLDIRECT in the last 72  hours. ------------------------------------------------------------------------------------------------------------------ No results for input(s): TSH, T4TOTAL, T3FREE, THYROIDAB in the last 72 hours.  Invalid input(s): FREET3 ------------------------------------------------------------------------------------------------------------------ No results for input(s): VITAMINB12, FOLATE, FERRITIN, TIBC, IRON, RETICCTPCT in the last 72 hours.  Coagulation profile No results for input(s): INR, PROTIME in the last 168 hours.  No results for input(s): DDIMER in the last 72 hours.  Cardiac Enzymes  Recent Labs Lab 08/08/2014 1935 08/28/14 0042 08/28/14 0620  TROPONINI <0.30 <0.30 <0.30   ------------------------------------------------------------------------------------------------------------------ Invalid input(s): POCBNP    Shantil Vallejo D.O. on 09-25-2014 at 10:10 AM  Between 7am to 7pm - Pager - [phone]  After 7pm go to [email or web address] - password [email or web address]  And look for the night coverage person covering for me after hours  [facility] Office  [phone]

## 2014-09-29 NOTE — Progress Notes (Signed)
Received call from [place] that [name] had become acutely unresponsive and hypoxic after an episode of emesis and after being given small doses of IV dilaudid and diazepam for comfort and prior to NG tube placement. She received a small amount of narcan with only a small response but no appreciable change in her symptoms. Her abdomen is significantly more distended than earlier today and she has course congestion especially in her right lung. She appears to be actively dying. Multiple family members are gathered around her bedside. I discussed her current situation and that after our conversation today comfort and peace would be the best we could do for her. Moments after I left the room the RN notified me that [name] had expired. Family with acute grief- and present with her at the time of death.  I confirmed pronouncement. Time of death 8:15PM 10/03/2023. Primary service to complete death summary - as a courtesy have completed the death certificate.  Appreciate chaplain support and presence.  Lane Hacker, DO Palliative Medicine

## 2014-09-29 NOTE — Progress Notes (Signed)
Patient given dilaudid per order for NG tube placement.  Patient unresponsive to sternal rub.  Taking heavy breaths through mouth.  Unable to obtain pulse ox.  MD notified.  Patient daughter, Fraser Din, called.  Narcan given per order.

## 2014-09-29 DEATH — deceased

## 2014-10-03 ENCOUNTER — Ambulatory Visit: Payer: Medicare Other | Admitting: Family

## 2014-10-03 ENCOUNTER — Other Ambulatory Visit (HOSPITAL_COMMUNITY): Payer: Medicare Other

## 2016-03-09 IMAGING — CT CT ABDOMEN W/ CM
2 of 5 series · 13 of 46 positions shown, 15 images · IV contrast (Iodine)
Comparison: 05/01/2014

CLINICAL DATA: Abdominal pain. Right nephrectomy for renal cell
carcinoma 10 years ago. Appendectomy.

EXAM:
CT ABDOMEN WITH CONTRAST
TECHNIQUE: Multidetector CT imaging of the abdomen was performed using the
standard protocol following bolus administration of intravenous
contrast.
CONTRAST:  100mL OMNIPAQUE IOHEXOL 300 MG/ML  SOLN

[Series 201: routine, idose (2) · axial · 0.97mm/px · z∈[-355,-90]mm · 10 of 63 slices shown, 12 images]
[im 5/63  soft-tissue]
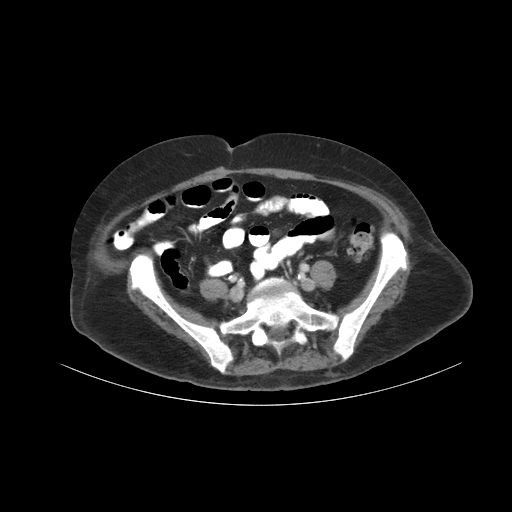
[im 5/63  bone]
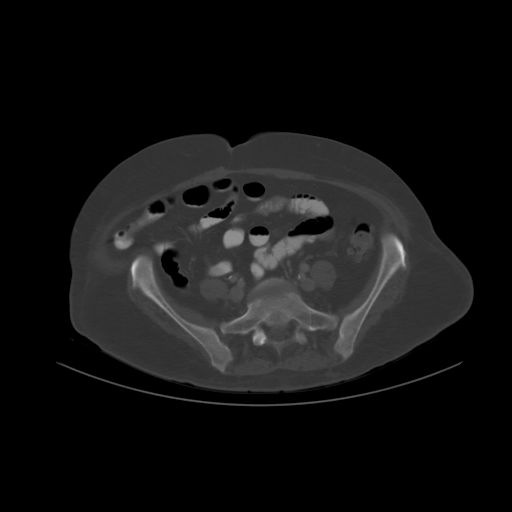
[im 9/63  soft-tissue]
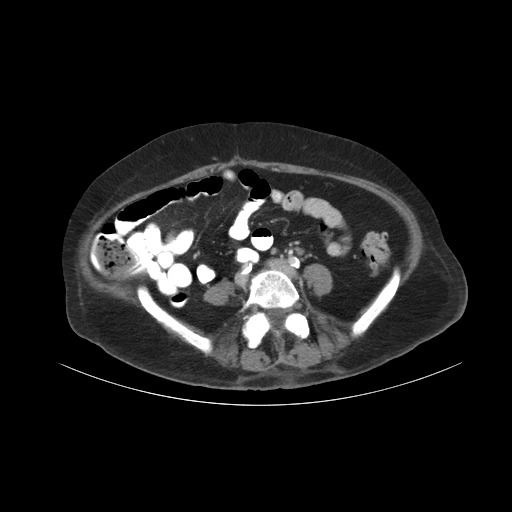
[im 18/63  soft-tissue]
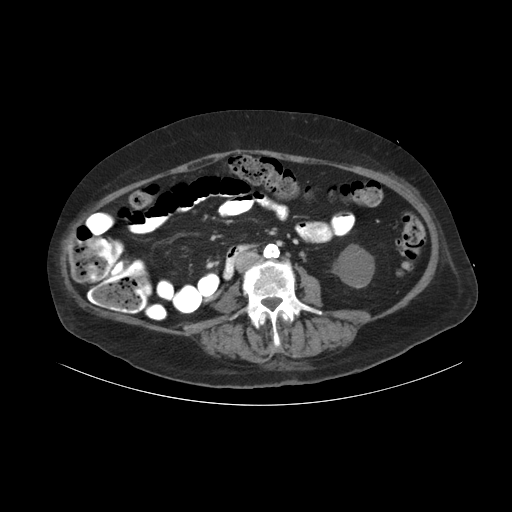
[im 23/63  soft-tissue]
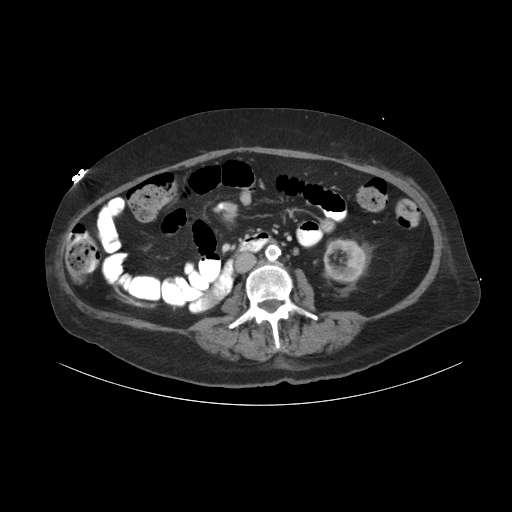
[im 27/63  soft-tissue]
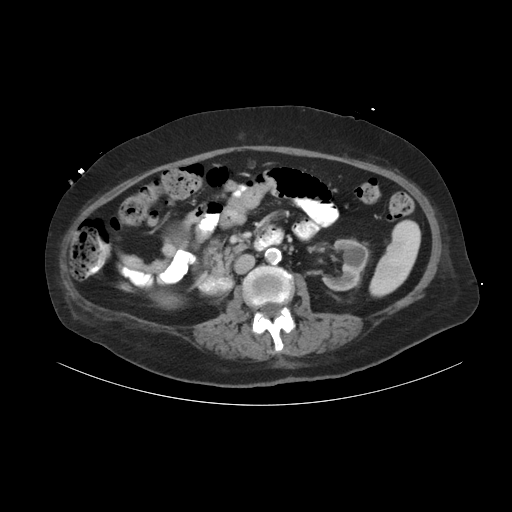
[im 36/63  soft-tissue]
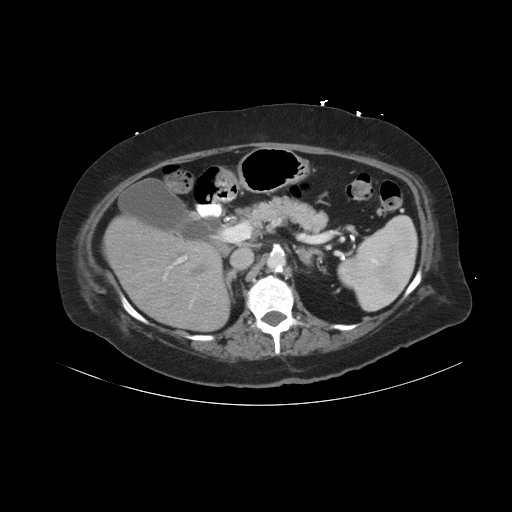
[im 40/63  soft-tissue]
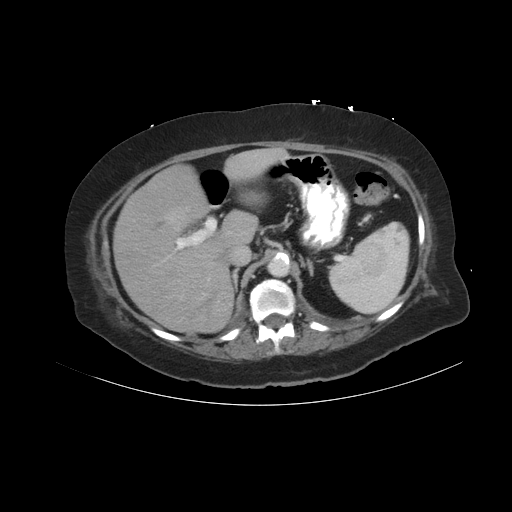
[im 45/63  soft-tissue]
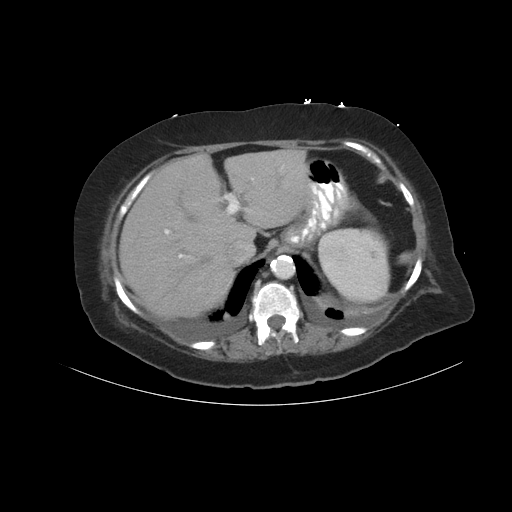
[im 54/63  soft-tissue]
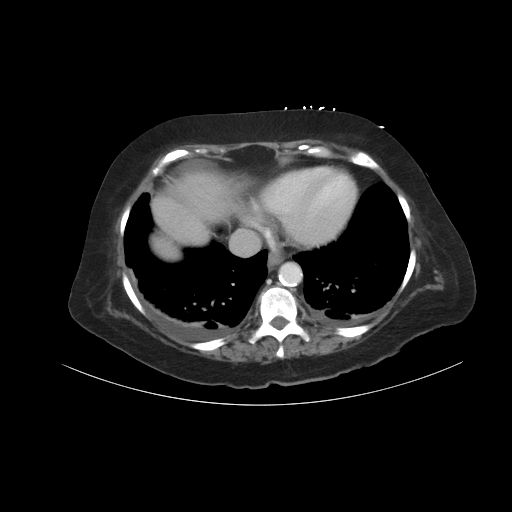
[im 54/63  bone]
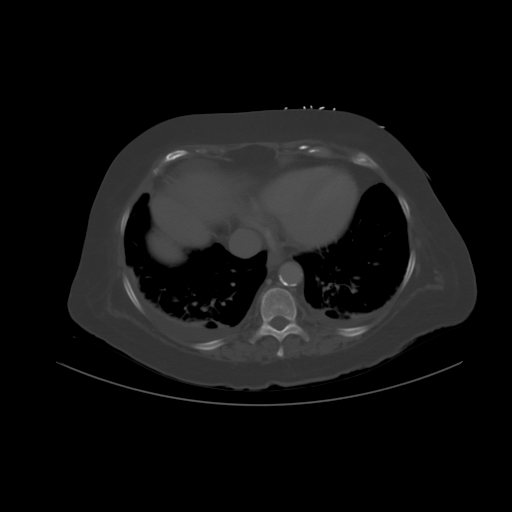
[im 58/63  soft-tissue]
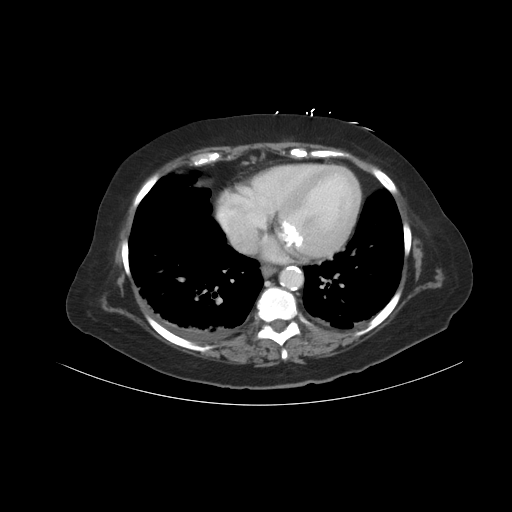

[Series 203: coronals, idose (2) · coronal · 0.50mm/px · 3 of 118 slices shown]
[im 40/118  soft-tissue]
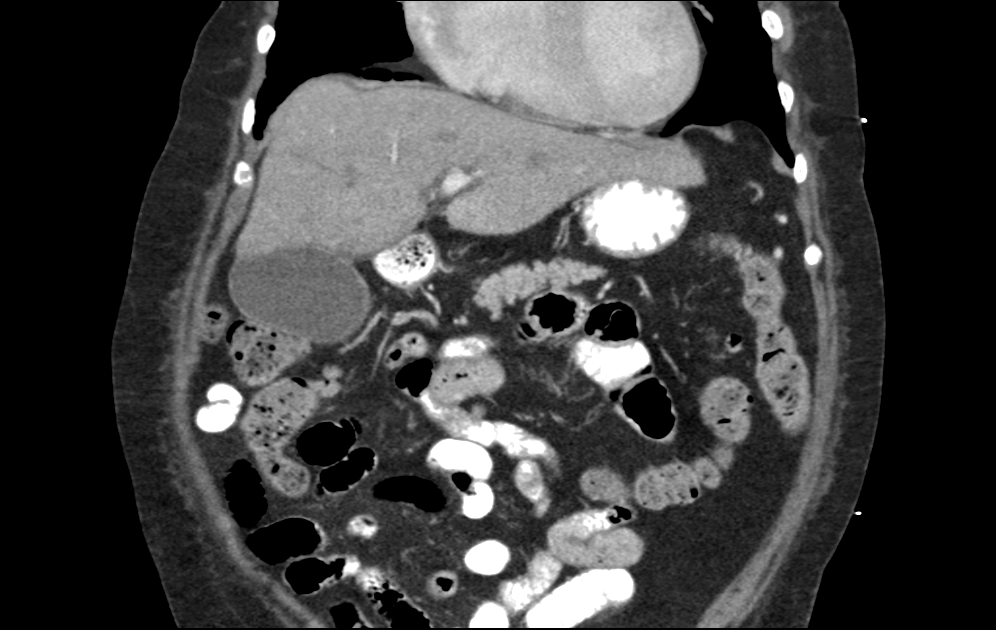
[im 53/118  soft-tissue]
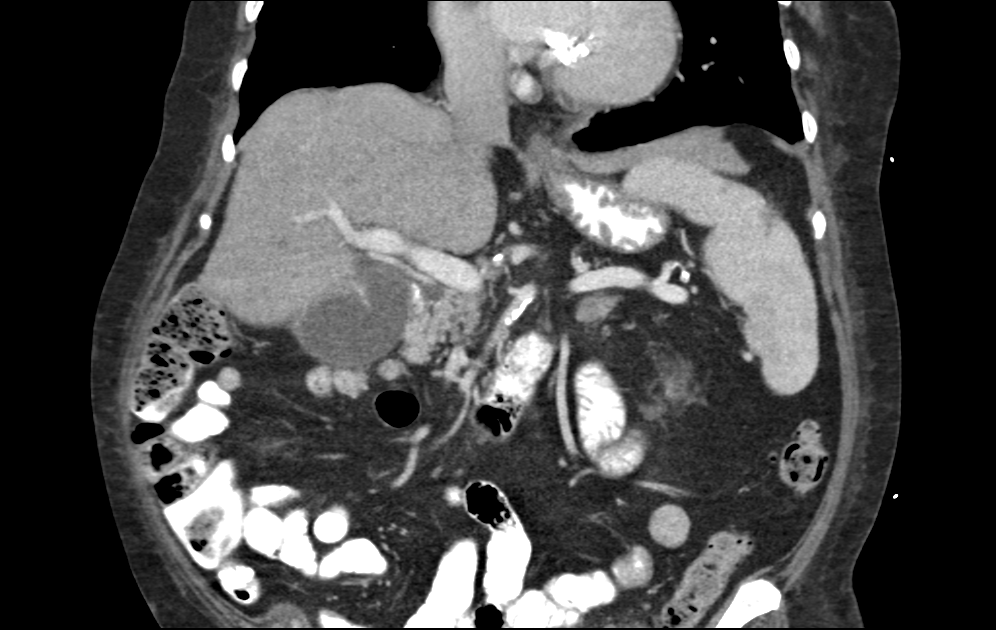
[im 66/118  soft-tissue]
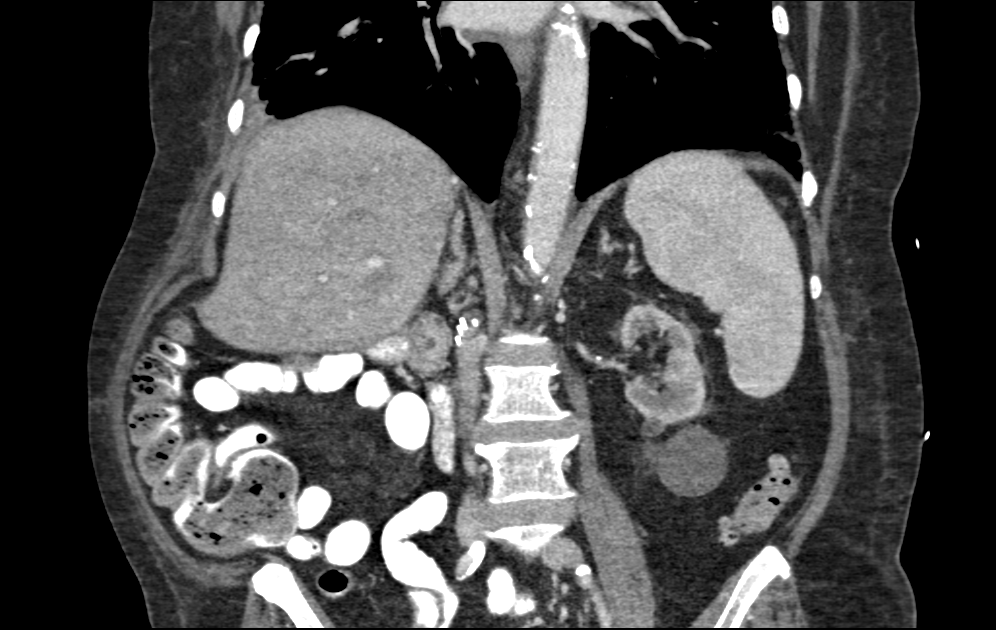

[13 of 46 positions shown; findings below may reference images not displayed]

FINDINGS: Lower Chest: Bibasilar atelectasis. Mild cardiomegaly with small
bilateral pleural effusions.

Abdomen: Normal liver. Splenic size upper normal with subcentimeter
nonspecific peripheral hypo attenuating foci within. These are of
indeterminate acuity, but appear new since the most recent
contrast-enhanced study of 07/27/2012.

Normal stomach, pancreas. Gallbladder borderline distended, without
pericholecystic edema or biliary ductal dilatation.

Normal right adrenal gland. Similar left adrenal
thickening/nodularity.

Moderate left renal atrophy. Minimally complex lower pole left renal
cyst (calcification dependently). Other left renal lesions which are
consistent with cysts. Right nephrectomy, without locally recurrent
disease.

Advanced aortic and branch vessel atherosclerosis. No
retroperitoneal or retrocrural adenopathy.

Extensive colonic diverticulosis. Normal terminal ileum. Normal
abdominal small bowel without ascites.

Bones/Musculoskeletal:  No acute osseous abnormality.
IMPRESSION: 1. Multiple low-density subcapsular foci within the spleen. These
are indeterminate, but appear new since 07/27/2012. Differential
considerations include small volume splenic infarcts versus interval
development of lymphangiomas.
2. No other explanation for abdominal pain.
3. Borderline gallbladder distention, without specific evidence of
acute cholecystitis.
4. Right nephrectomy without locally recurrent or metastatic
disease.
5. Small bilateral pleural effusions.
6. Advanced atherosclerosis.
